# Patient Record
Sex: Male | Born: 1940 | Race: White | Hispanic: No | Marital: Single | Smoking: Former smoker
Health system: Southern US, Community
[De-identification: ages and names within clinical notes are randomized; demographics above are authoritative.]

## PROBLEM LIST (undated history)

## (undated) DIAGNOSIS — K219 Gastro-esophageal reflux disease without esophagitis: Secondary | ICD-10-CM

## (undated) DIAGNOSIS — K449 Diaphragmatic hernia without obstruction or gangrene: Secondary | ICD-10-CM

## (undated) DIAGNOSIS — J189 Pneumonia, unspecified organism: Secondary | ICD-10-CM

## (undated) DIAGNOSIS — H3320 Serous retinal detachment, unspecified eye: Secondary | ICD-10-CM

## (undated) DIAGNOSIS — M810 Age-related osteoporosis without current pathological fracture: Secondary | ICD-10-CM

## (undated) DIAGNOSIS — I639 Cerebral infarction, unspecified: Secondary | ICD-10-CM

## (undated) DIAGNOSIS — E785 Hyperlipidemia, unspecified: Secondary | ICD-10-CM

## (undated) DIAGNOSIS — I10 Essential (primary) hypertension: Secondary | ICD-10-CM

## (undated) DIAGNOSIS — K469 Unspecified abdominal hernia without obstruction or gangrene: Secondary | ICD-10-CM

## (undated) DIAGNOSIS — F482 Pseudobulbar affect: Secondary | ICD-10-CM

## (undated) DIAGNOSIS — M62838 Other muscle spasm: Secondary | ICD-10-CM

## (undated) HISTORY — DX: Hyperlipidemia, unspecified: E78.5

## (undated) HISTORY — DX: Diaphragmatic hernia without obstruction or gangrene: K44.9

## (undated) HISTORY — DX: Pseudobulbar affect: F48.2

## (undated) HISTORY — PX: CATARACT EXTRACTION W/ INTRAOCULAR LENS  IMPLANT, BILATERAL: SHX1307

## (undated) HISTORY — DX: Age-related osteoporosis without current pathological fracture: M81.0

## (undated) HISTORY — DX: Essential (primary) hypertension: I10

## (undated) HISTORY — DX: Other muscle spasm: M62.838

---

## 2005-05-22 DIAGNOSIS — I639 Cerebral infarction, unspecified: Secondary | ICD-10-CM

## 2005-05-22 HISTORY — DX: Cerebral infarction, unspecified: I63.9

## 2009-05-27 ENCOUNTER — Encounter (INDEPENDENT_AMBULATORY_CARE_PROVIDER_SITE_OTHER): Payer: Self-pay | Admitting: General Surgery

## 2009-05-27 ENCOUNTER — Ambulatory Visit (HOSPITAL_COMMUNITY): Admission: EM | Admit: 2009-05-27 | Discharge: 2009-05-31 | Payer: Self-pay | Admitting: Emergency Medicine

## 2009-05-27 HISTORY — PX: APPENDECTOMY: SHX54

## 2010-06-17 ENCOUNTER — Emergency Department (HOSPITAL_COMMUNITY)
Admission: EM | Admit: 2010-06-17 | Discharge: 2010-06-17 | Payer: Self-pay | Source: Home / Self Care | Admitting: Emergency Medicine

## 2010-08-07 LAB — POCT CARDIAC MARKERS
Myoglobin, poc: 118 ng/mL (ref 12–200)
Troponin i, poc: <0.05 ng/mL (ref 0.00–0.09)

## 2010-08-07 LAB — SAMPLE TO BLOOD BANK

## 2010-08-07 LAB — CBC
HCT: 38.3 % — ABNORMAL LOW (ref 39.0–52.0)
Hemoglobin: 12.8 g/dL — ABNORMAL LOW (ref 13.0–17.0)
Hemoglobin: 15.7 g/dL (ref 13.0–17.0)
MCHC: 33.4 g/dL (ref 30.0–36.0)
MCHC: 33.7 g/dL (ref 30.0–36.0)
Platelets: 130 10*3/uL — ABNORMAL LOW (ref 150–400)
RBC: 3.9 MIL/uL — ABNORMAL LOW (ref 4.22–5.81)
RBC: 5.15 MIL/uL (ref 4.22–5.81)
RDW: 13.7 % (ref 11.5–15.5)
RDW: 14 % (ref 11.5–15.5)
WBC: 7.7 10*3/uL (ref 4.0–10.5)

## 2010-08-07 LAB — COMPREHENSIVE METABOLIC PANEL
BUN: 22 mg/dL (ref 6–23)
Chloride: 105 mEq/L (ref 96–112)
GFR calc non Af Amer: >60 mL/min (ref >60)
Glucose, Bld: 131 mg/dL — ABNORMAL HIGH (ref 70–99)
Potassium: 4.6 mEq/L (ref 3.5–5.1)
Sodium: 140 mEq/L (ref 135–145)
Total Bilirubin: 0.9 mg/dL (ref 0.3–1.2)

## 2010-08-07 LAB — DIFFERENTIAL
Eosinophils Absolute: 0 10*3/uL (ref 0.0–0.7)
Lymphocytes Relative: 12 % (ref 12–46)
Neutro Abs: 10.4 10*3/uL — ABNORMAL HIGH (ref 1.7–7.7)

## 2011-07-21 ENCOUNTER — Other Ambulatory Visit: Payer: Self-pay | Admitting: Internal Medicine

## 2011-07-27 ENCOUNTER — Other Ambulatory Visit: Payer: Self-pay

## 2011-07-27 ENCOUNTER — Ambulatory Visit
Admission: RE | Admit: 2011-07-27 | Discharge: 2011-07-27 | Disposition: A | Discharge status: Home / Self Care | Payer: Medicare Other | Source: Ambulatory Visit | Attending: Internal Medicine | Admitting: Internal Medicine

## 2012-04-06 ENCOUNTER — Encounter (HOSPITAL_COMMUNITY): Payer: Self-pay | Admitting: Emergency Medicine

## 2012-04-06 ENCOUNTER — Emergency Department (HOSPITAL_COMMUNITY)
Admission: EM | Admit: 2012-04-06 | Discharge: 2012-04-07 | Disposition: A | Discharge status: Home / Self Care | Payer: Medicare Other | Attending: Emergency Medicine | Admitting: Emergency Medicine

## 2012-04-06 DIAGNOSIS — K449 Diaphragmatic hernia without obstruction or gangrene: Secondary | ICD-10-CM

## 2012-04-06 DIAGNOSIS — I10 Essential (primary) hypertension: Secondary | ICD-10-CM | POA: Insufficient documentation

## 2012-04-06 DIAGNOSIS — Z8673 Personal history of transient ischemic attack (TIA), and cerebral infarction without residual deficits: Secondary | ICD-10-CM | POA: Insufficient documentation

## 2012-04-06 DIAGNOSIS — Z79899 Other long term (current) drug therapy: Secondary | ICD-10-CM | POA: Insufficient documentation

## 2012-04-06 DIAGNOSIS — K219 Gastro-esophageal reflux disease without esophagitis: Secondary | ICD-10-CM | POA: Insufficient documentation

## 2012-04-06 HISTORY — DX: Gastro-esophageal reflux disease without esophagitis: K21.9

## 2012-04-06 HISTORY — DX: Cerebral infarction, unspecified: I63.9

## 2012-04-06 HISTORY — DX: Essential (primary) hypertension: I10

## 2012-04-06 MED ORDER — GI COCKTAIL ~~LOC~~
30.0000 mL | Freq: Once | ORAL | Status: AC
  Administered 2012-04-06: 30 mL via ORAL
  Filled 2012-04-06: qty 30

## 2012-04-06 NOTE — ED Provider Notes (Signed)
History     CSN: [id number]  Arrival date & time 04/06/12  2215   First MD Initiated Contact with Patient 04/06/12 2253      Chief Complaint  Patient presents with  . Abdominal Pain    (Consider location/radiation/quality/duration/timing/severity/associated sxs/prior treatment) HPI This is a 71 year old male who is a resident of a nursing facility due to left hand he presents from an old stroke. He states he's been having episodes of heartburn off and on for about the last 3 weeks. These are usually relieved by TUMS. He had an episode began this evening which was not relieved by [facility] or wasn't relieved by Maalox. The discomfort is moderate to severe, characterized as heartburn, and only mildly exacerbated by movement or palpation. He has had some nausea but no vomiting or diarrhea. He has had some sweating. He he states he is not short of breath. He was given aspirin and sublingual nitroglycerin by EMS prior to arrival without significant relief.  Past Medical History  Diagnosis Date  . Stroke   . GERD (gastroesophageal reflux disease)   . Hypertension     History reviewed. No pertinent past surgical history.  No family history on file.  History  Substance Use Topics  . Smoking status: Not on file  . Smokeless tobacco: Not on file  . Alcohol Use:       Review of Systems  All other systems reviewed and are negative.    Allergies  Penicillins  Home Medications   Current Outpatient Rx  Name  Route  Sig  Dispense  Refill  . ALUMINUM & MAGNESIUM HYDROXIDE 225-200 MG/5ML PO SUSP   Oral   Take 30 mLs by mouth daily as needed. For indigestion         . AMLODIPINE BESYLATE 10 MG PO TABS   Oral   Take 10 mg by mouth daily.         Marland Kitchen BIOTENE DRY MOUTH MT LIQD   Mouth Rinse   10 mLs by Mouth Rinse route 5 (five) times daily as needed. For dry mouth         . BACLOFEN 10 MG PO TABS   Oral   Take 10 mg by mouth 4 (four) times daily.         Marland Kitchen CALCIUM  CARBONATE 1250 MG PO TABS   Oral   Take 1 tablet by mouth 2 (two) times daily with a meal. 500mg  elemental         . VITAMIN D3 2000 UNITS PO CAPS   Oral   Take 2,000 Units by mouth daily.         Marland Kitchen DEXTROMETHORPHAN-QUINIDINE 20-10 MG PO CAPS   Oral   Take 1 capsule by mouth daily.         Marland Kitchen DICYCLOMINE HCL 20 MG PO TABS   Oral   Take 20 mg by mouth 3 (three) times daily.         . ASPIRIN-DIPYRIDAMOLE ER 25-200 MG PO CP12   Oral   Take 1 capsule by mouth 2 (two) times daily.         . FUROSEMIDE 20 MG PO TABS   Oral   Take 20 mg by mouth daily.         Marland Kitchen GEMFIBROZIL 600 MG PO TABS   Oral   Take 600 mg by mouth 2 (two) times daily.         . IBANDRONATE SODIUM 150 MG PO TABS   Oral  Take 150 mg by mouth every 30 (thirty) days. Take in the morning with a full glass of water, on an empty stomach, and do not take anything else by mouth or lie down for the next 30 min. Next dose due on 04/13/12.         Marland Kitchen OMEPRAZOLE 20 MG PO CPDR   Oral   Take 20 mg by mouth daily.         Marland Kitchen PRENATAL PLUS PO   Oral   Take 1 tablet by mouth 2 (two) times daily with a meal.         . SIMVASTATIN 20 MG PO TABS   Oral   Take 20 mg by mouth every evening.           BP 122/80  Temp 97.7 F (36.5 C) (Oral)  Resp 20  SpO2 92%  Physical Exam General: Well-developed, well-nourished male in no acute distress; appearance consistent with age of record HENT: normocephalic, atraumatic Eyes: pupils equal round and reactive to light; extraocular muscles intact Neck: supple Heart: regular rate and rhythm Lungs: clear to auscultation bilaterally Abdomen: soft; nondistended; epigastric; bowel sounds present Extremities: No deformity; full range of motion; trace edema of lower legs Neurologic: Awake, alert and oriented; left hemiparesis; no facial droop Skin: Warm and dry Psychiatric: Normal mood and affect    ED Course  Procedures (including critical care  time)    MDM   Nursing notes and vitals signs, including pulse oximetry, reviewed.  Summary of this visit's results, reviewed by myself:  Labs:  Results for orders placed during the hospital encounter of 04/06/12  CBC WITH DIFFERENTIAL      Component Value Range   WBC 12.0 (*) 4.0 - 10.5 K/uL   RBC 4.92  4.22 - 5.81 MIL/uL   Hemoglobin 15.2  13.0 - 17.0 g/dL   HCT 40.9  81.1 - 91.4 %   MCV 88.8  78.0 - 100.0 fL   MCH 30.9  26.0 - 34.0 pg   MCHC 34.8  30.0 - 36.0 g/dL   RDW 78.2  95.6 - 21.3 %   Platelets 217  150 - 400 K/uL   Neutrophils Relative 92 (*) 43 - 77 %   Neutro Abs 11.0 (*) 1.7 - 7.7 K/uL   Lymphocytes Relative 5 (*) 12 - 46 %   Lymphs Abs 0.6 (*) 0.7 - 4.0 K/uL   Monocytes Relative 2 (*) 3 - 12 %   Monocytes Absolute 0.3  0.1 - 1.0 K/uL   Eosinophils Relative 0  0 - 5 %   Eosinophils Absolute 0.0  0.0 - 0.7 K/uL   Basophils Relative 0  0 - 1 %   Basophils Absolute 0.0  0.0 - 0.1 K/uL  COMPREHENSIVE METABOLIC PANEL      Component Value Range   Sodium 141  135 - 145 mEq/L   Potassium 4.0  3.5 - 5.1 mEq/L   Chloride 101  96 - 112 mEq/L   CO2 24  19 - 32 mEq/L   Glucose, Bld 173 (*) 70 - 99 mg/dL   BUN 21  6 - 23 mg/dL   Creatinine, Ser 0.86  0.50 - 1.35 mg/dL   Calcium 57.8  8.4 - 46.9 mg/dL   Total Protein 8.2  6.0 - 8.3 g/dL   Albumin 4.1  3.5 - 5.2 g/dL   AST 25  0 - 37 U/L   ALT 19  0 - 53 U/L   Alkaline Phosphatase 163 (*)  39 - 117 U/L   Total Bilirubin 0.3  0.3 - 1.2 mg/dL   GFR calc non Af Amer 87 (*) >90 mL/min   GFR calc Af Amer >90  >90 mL/min  LIPASE, BLOOD      Component Value Range   Lipase 13  11 - 59 U/L  TROPONIN I      Component Value Range   Troponin I <0.30  <0.30 ng/mL    Imaging Studies: Dg Chest 2 View  04/07/2012  *RADIOLOGY REPORT*  Clinical Data: Abdominal pain  CHEST - 2 VIEW  Comparison: 05/29/2009  Findings: Large hiatal hernia, increased in size from 2011. Cardiomediastinal contours are otherwise unchanged.   Hypoaeration with interstitial and vascular crowding.  Retrocardiac opacity.  No definite pleural effusion or pneumothorax.  Atherosclerotic vascular calcification.  Diffuse osteopenia and multilevel degenerative changes.  IMPRESSION: Large hiatal hernia, increased in size in the interval.  Retrocardiac opacity is nonspecific; atelectasis, scarring, or infiltrate.   Original Report Authenticated By: Jearld Lesch, M.D.       EKG Interpretation:  Date & Time: 04/06/2012 10:33 PM  Rate: 85  Rhythm: normal sinus rhythm  QRS Axis: normal  Intervals: normal  ST/T Wave abnormalities: nonspecific T wave changes  Conduction Disutrbances:none  Narrative Interpretation:   Old EKG Reviewed: none available  2:12 AM History, examination and x-rays consistent with symptomatic hiatal hernia. We'll increase his omeprazole dose and refer to [facility].        Hanley Seamen, MD 04/07/12 [phone]

## 2012-04-06 NOTE — ED Notes (Signed)
Patient does have significant left side deficit to the left side of the body due to prior stroke. Pt does not ambulate on his own.

## 2012-04-06 NOTE — ED Notes (Signed)
PER EMS- Patient from nursing facility because of stroke several years ago. Pt reports epigastric pain that started approx. 2 hours ago. Patient received 324 asa and 1 NTG with mild relief in route. EMS EKG unremarkable. Alertx4, NAD. Hx HTN, CVA, GERD, previous GI issues. Allergic to PCN.

## 2012-04-07 ENCOUNTER — Emergency Department (HOSPITAL_COMMUNITY): Payer: Medicare Other

## 2012-04-07 LAB — COMPREHENSIVE METABOLIC PANEL
ALT: 19 U/L (ref 0–53)
AST: 25 U/L (ref 0–37)
Alkaline Phosphatase: 163 U/L — ABNORMAL HIGH (ref 39–117)
Calcium: 10.4 mg/dL (ref 8.4–10.5)
GFR calc Af Amer: >90 mL/min (ref >90)
Glucose, Bld: 173 mg/dL — ABNORMAL HIGH (ref 70–99)
Potassium: 4 mEq/L (ref 3.5–5.1)
Sodium: 141 mEq/L (ref 135–145)
Total Protein: 8.2 g/dL (ref 6.0–8.3)

## 2012-04-07 LAB — CBC WITH DIFFERENTIAL/PLATELET
HCT: 43.7 % (ref 39.0–52.0)
Hemoglobin: 15.2 g/dL (ref 13.0–17.0)
Lymphocytes Relative: 5 % — ABNORMAL LOW (ref 12–46)
Monocytes Absolute: 0.3 10*3/uL (ref 0.1–1.0)
Neutro Abs: 11 10*3/uL — ABNORMAL HIGH (ref 1.7–7.7)
RBC: 4.92 MIL/uL (ref 4.22–5.81)
RDW: 12.9 % (ref 11.5–15.5)

## 2012-04-07 MED ORDER — FENTANYL CITRATE 0.05 MG/ML IJ SOLN
100.0000 ug | Freq: Once | INTRAMUSCULAR | Status: AC
  Administered 2012-04-07: 100 ug via INTRAVENOUS
  Filled 2012-04-07: qty 2

## 2012-04-07 MED ORDER — OMEPRAZOLE 40 MG PO CPDR: DELAYED_RELEASE_CAPSULE | ORAL | Status: DC

## 2012-04-07 NOTE — ED Notes (Signed)
Patient transported to X-ray 

## 2012-04-07 NOTE — ED Notes (Signed)
Paging [facility] at this time for transfer back to nursing facility. Alertx4, NAD at this time.

## 2012-04-11 ENCOUNTER — Emergency Department (HOSPITAL_COMMUNITY)
Admission: EM | Admit: 2012-04-11 | Discharge: 2012-04-11 | Disposition: A | Discharge status: Home / Self Care | Payer: Medicare Other | Attending: Emergency Medicine | Admitting: Emergency Medicine

## 2012-04-11 ENCOUNTER — Emergency Department (HOSPITAL_COMMUNITY): Payer: Medicare Other

## 2012-04-11 ENCOUNTER — Encounter (HOSPITAL_COMMUNITY): Payer: Self-pay | Admitting: Emergency Medicine

## 2012-04-11 DIAGNOSIS — Z8673 Personal history of transient ischemic attack (TIA), and cerebral infarction without residual deficits: Secondary | ICD-10-CM | POA: Insufficient documentation

## 2012-04-11 DIAGNOSIS — K219 Gastro-esophageal reflux disease without esophagitis: Secondary | ICD-10-CM | POA: Insufficient documentation

## 2012-04-11 DIAGNOSIS — Z7982 Long term (current) use of aspirin: Secondary | ICD-10-CM | POA: Insufficient documentation

## 2012-04-11 DIAGNOSIS — K449 Diaphragmatic hernia without obstruction or gangrene: Secondary | ICD-10-CM | POA: Insufficient documentation

## 2012-04-11 DIAGNOSIS — Z79899 Other long term (current) drug therapy: Secondary | ICD-10-CM | POA: Insufficient documentation

## 2012-04-11 DIAGNOSIS — N390 Urinary tract infection, site not specified: Secondary | ICD-10-CM

## 2012-04-11 DIAGNOSIS — I10 Essential (primary) hypertension: Secondary | ICD-10-CM | POA: Insufficient documentation

## 2012-04-11 HISTORY — DX: Unspecified abdominal hernia without obstruction or gangrene: K46.9

## 2012-04-11 LAB — COMPREHENSIVE METABOLIC PANEL
AST: 156 U/L — ABNORMAL HIGH (ref 0–37)
BUN: 20 mg/dL (ref 6–23)
CO2: 23 mEq/L (ref 19–32)
Calcium: 9.6 mg/dL (ref 8.4–10.5)
Chloride: 102 mEq/L (ref 96–112)
Creatinine, Ser: 0.79 mg/dL (ref 0.50–1.35)
GFR calc Af Amer: >90 mL/min (ref >90)
GFR calc non Af Amer: >90 mL/min (ref >90)
Glucose, Bld: 186 mg/dL — ABNORMAL HIGH (ref 70–99)
Total Bilirubin: 1.3 mg/dL — ABNORMAL HIGH (ref 0.3–1.2)

## 2012-04-11 LAB — URINALYSIS, ROUTINE W REFLEX MICROSCOPIC
Bilirubin Urine: NEGATIVE
Ketones, ur: NEGATIVE mg/dL
Nitrite: POSITIVE — AB
Protein, ur: 30 mg/dL — AB
pH: 6.5 (ref 5.0–8.0)

## 2012-04-11 LAB — CBC
HCT: 42.7 % (ref 39.0–52.0)
Hemoglobin: 14.9 g/dL (ref 13.0–17.0)
MCH: 30.8 pg (ref 26.0–34.0)
MCV: 88.2 fL (ref 78.0–100.0)
Platelets: 249 10*3/uL (ref 150–400)
RBC: 4.84 MIL/uL (ref 4.22–5.81)
WBC: 13.3 10*3/uL — ABNORMAL HIGH (ref 4.0–10.5)

## 2012-04-11 LAB — URINE MICROSCOPIC-ADD ON

## 2012-04-11 LAB — TROPONIN I: Troponin I: <0.3 ng/mL (ref <0.30)

## 2012-04-11 LAB — LIPASE, BLOOD: Lipase: 11 U/L (ref 11–59)

## 2012-04-11 MED ORDER — MORPHINE SULFATE 4 MG/ML IJ SOLN
4.0000 mg | Freq: Once | INTRAMUSCULAR | Status: AC
  Administered 2012-04-11: 4 mg via INTRAVENOUS
  Filled 2012-04-11: qty 1

## 2012-04-11 MED ORDER — SODIUM CHLORIDE 0.9 % IV BOLUS (SEPSIS)
500.0000 mL | Freq: Once | INTRAVENOUS | Status: AC
  Administered 2012-04-11: 500 mL via INTRAVENOUS

## 2012-04-11 MED ORDER — LEVOFLOXACIN 500 MG PO TABS: 500.0000 mg | ORAL_TABLET | Freq: Every day | ORAL | Status: DC

## 2012-04-11 MED ORDER — LEVOFLOXACIN IN D5W 750 MG/150ML IV SOLN
750.0000 mg | Freq: Once | INTRAVENOUS | Status: AC
  Administered 2012-04-11: 750 mg via INTRAVENOUS
  Filled 2012-04-11: qty 150

## 2012-04-11 MED ORDER — GI COCKTAIL ~~LOC~~
30.0000 mL | Freq: Once | ORAL | Status: AC
  Administered 2012-04-11: 30 mL via ORAL
  Filled 2012-04-11: qty 30

## 2012-04-11 MED ORDER — ONDANSETRON HCL 4 MG/2ML IJ SOLN
4.0000 mg | Freq: Once | INTRAMUSCULAR | Status: AC
  Administered 2012-04-11: 4 mg via INTRAVENOUS
  Filled 2012-04-11: qty 2

## 2012-04-11 NOTE — ED Notes (Signed)
[facility] here to transport patient back to nursing facility.

## 2012-04-11 NOTE — ED Provider Notes (Addendum)
History     CSN: [id number]  Arrival date & time 04/11/12  0002   First MD Initiated Contact with Patient 04/11/12 0003      Chief Complaint  Patient presents with  . Abdominal Pain  . Hernia    (Consider location/radiation/quality/duration/timing/severity/associated sxs/prior treatment) Patient is a 71 y.o. male presenting with abdominal pain. The history is provided by the patient.  Abdominal Pain The primary symptoms of the illness include abdominal pain. The onset of the illness was gradual. The problem has not changed since onset. The patient states that she believes she is currently not pregnant. The patient has not had a change in bowel habit. Significant associated medical issues include GERD.    Past Medical History  Diagnosis Date  . Stroke   . GERD (gastroesophageal reflux disease)   . Hypertension     No past surgical history on file.  No family history on file.  History  Substance Use Topics  . Smoking status: Not on file  . Smokeless tobacco: Not on file  . Alcohol Use:       Review of Systems  Gastrointestinal: Positive for abdominal pain.  All other systems reviewed and are negative.    Allergies  Penicillins  Home Medications   Current Outpatient Rx  Name  Route  Sig  Dispense  Refill  . ALUMINUM & MAGNESIUM HYDROXIDE 225-200 MG/5ML PO SUSP   Oral   Take 30 mLs by mouth daily as needed. For indigestion         . AMLODIPINE BESYLATE 10 MG PO TABS   Oral   Take 10 mg by mouth daily.         Marland Kitchen BIOTENE DRY MOUTH MT LIQD   Mouth Rinse   10 mLs by Mouth Rinse route 5 (five) times daily as needed. For dry mouth         . BACLOFEN 10 MG PO TABS   Oral   Take 10 mg by mouth 4 (four) times daily.         Marland Kitchen CALCIUM CARBONATE 1250 MG PO TABS   Oral   Take 1 tablet by mouth 2 (two) times daily with a meal. 500mg  elemental         . VITAMIN D3 2000 UNITS PO CAPS   Oral   Take 2,000 Units by mouth daily.         Marland Kitchen  DEXTROMETHORPHAN-QUINIDINE 20-10 MG PO CAPS   Oral   Take 1 capsule by mouth daily.         Marland Kitchen DICYCLOMINE HCL 20 MG PO TABS   Oral   Take 20 mg by mouth 3 (three) times daily.         . ASPIRIN-DIPYRIDAMOLE ER 25-200 MG PO CP12   Oral   Take 1 capsule by mouth 2 (two) times daily.         . FUROSEMIDE 20 MG PO TABS   Oral   Take 20 mg by mouth daily.         Marland Kitchen GEMFIBROZIL 600 MG PO TABS   Oral   Take 600 mg by mouth 2 (two) times daily.         . IBANDRONATE SODIUM 150 MG PO TABS   Oral   Take 150 mg by mouth every 30 (thirty) days. Take in the morning with a full glass of water, on an empty stomach, and do not take anything else by mouth or lie down for the next 30 min.  Next dose due on 04/13/12.         Marland Kitchen OMEPRAZOLE 40 MG PO CPDR      Take 1 capsule daily before breakfast.   30 capsule   0   . PRENATAL PLUS PO   Oral   Take 1 tablet by mouth 2 (two) times daily with a meal.         . SIMVASTATIN 20 MG PO TABS   Oral   Take 20 mg by mouth every evening.           BP 158/88  Pulse 107  Temp 98.3 F (36.8 C) (Oral)  Resp 20  SpO2 90%  Physical Exam  Constitutional: He is oriented to person, place, and time. He appears well-developed and well-nourished.  HENT:  Head: Normocephalic and atraumatic.  Eyes: Conjunctivae normal are normal. Pupils are equal, round, and reactive to light.  Neck: Normal range of motion. Neck supple.  Cardiovascular: Normal rate, regular rhythm, normal heart sounds and intact distal pulses.   Pulmonary/Chest: Effort normal and breath sounds normal.  Abdominal: Soft. Bowel sounds are normal. There is tenderness.       Epigastric tenderness  Neurological: He is alert and oriented to person, place, and time.       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  Psychiatric: He has a normal mood and affect. His behavior is normal. Judgment and thought content normal.    ED Course    Procedures (including critical care time)   Labs Reviewed  CBC  COMPREHENSIVE METABOLIC PANEL  TROPONIN I  LIPASE, BLOOD   No results found.   No diagnosis found.   Date: 04/11/2012  Rate: 106  Rhythm: sinus tachycardia  QRS Axis: normal  Intervals: normal  ST/T Wave abnormalities: nonspecific ST changes  Conduction Disutrbances:none  Narrative Interpretation:   Old EKG Reviewed: changes noted   MDM  + epigastric pain.  Hx of gerd,  Hiatal hernia,  Will analgesia,  Image, reassess  Uti,  Will treat dc, ret new/worsening sxs      Deana Krock Lytle Michaels, MD 04/11/12 0025  Meg Niemeier Lytle Michaels, MD 04/11/12 [phone]

## 2012-04-11 NOTE — ED Notes (Signed)
Secretary called [facility] for pt. To be transported to [facility].  Report called; spoke with Inetta Fermo (nurse)/report given.  Advised nurse Inetta Fermo of medications administrated including morphine, Zofran, GI cocktail, 500 ml fluid bolus of NS and levofloxacin given.  Also advised nurse Inetta Fermo of abnormal labs including WBC and urine abnormal labs.  Advised to have pt. follow up with physician for scheduled appointment on Wednesday.(hernia)

## 2012-04-11 NOTE — ED Notes (Signed)
Pt. presents to ED via [facility] with upper abdominal pain with what the pt. Describes as acid reflux symptoms.  Pt. Denies N/V/D.  Pt. Rates pain at 10 out of 10 on pain scale.  Pt. Was constipated; however had bowel movement today.  Pt. Has hx. Of hernia and is to be seen by physician on Wednesday of next week.

## 2012-04-12 ENCOUNTER — Other Ambulatory Visit: Payer: Self-pay | Admitting: Gastroenterology

## 2012-04-12 DIAGNOSIS — R1013 Epigastric pain: Secondary | ICD-10-CM

## 2012-04-12 LAB — URINE CULTURE: Colony Count: 40000

## 2012-05-01 ENCOUNTER — Ambulatory Visit
Admission: RE | Admit: 2012-05-01 | Discharge: 2012-05-01 | Disposition: A | Discharge status: Home / Self Care | Payer: Medicare Other | Source: Ambulatory Visit | Attending: Gastroenterology | Admitting: Gastroenterology

## 2012-05-01 ENCOUNTER — Other Ambulatory Visit: Payer: Self-pay | Admitting: Gastroenterology

## 2012-05-01 DIAGNOSIS — R1013 Epigastric pain: Secondary | ICD-10-CM

## 2012-05-31 ENCOUNTER — Other Ambulatory Visit (INDEPENDENT_AMBULATORY_CARE_PROVIDER_SITE_OTHER): Payer: Self-pay | Admitting: General Surgery

## 2012-05-31 ENCOUNTER — Encounter (INDEPENDENT_AMBULATORY_CARE_PROVIDER_SITE_OTHER): Payer: Self-pay | Admitting: General Surgery

## 2012-05-31 ENCOUNTER — Telehealth (INDEPENDENT_AMBULATORY_CARE_PROVIDER_SITE_OTHER): Payer: Self-pay

## 2012-05-31 ENCOUNTER — Ambulatory Visit (INDEPENDENT_AMBULATORY_CARE_PROVIDER_SITE_OTHER): Payer: Medicare Other | Admitting: General Surgery

## 2012-05-31 ENCOUNTER — Encounter (INDEPENDENT_AMBULATORY_CARE_PROVIDER_SITE_OTHER): Payer: Self-pay | Admitting: Surgery

## 2012-05-31 VITALS — BP 112/82 | HR 80 | Temp 99.0°F | Resp 16 | Ht 69.0 in | Wt 153.0 lb

## 2012-05-31 DIAGNOSIS — K449 Diaphragmatic hernia without obstruction or gangrene: Secondary | ICD-10-CM

## 2012-05-31 NOTE — Telephone Encounter (Signed)
Called pt and spoke with his sister in regards to switching his appt with MM @ 130p to AR @2pm .  Sister said this was OK

## 2012-05-31 NOTE — Progress Notes (Signed)
Subjective:     Patient ID: Gregory Kerr, male   DOB: 07/25/1940, 72 y.o.   MRN: [id number]  HPI The patient is a 72 year old male who was referred by Dr. Bosie Clos for a large hiatal hernia. Patient states that he had epigastric pain prior to Thanksgiving and was seen in the ED on 2 occasions. Patient underwent CT scan which revealed large hiatal hernia. Subsequent to this patient had a upper GI which revealed the majority of her stomach being in his left chest. Since that time patient states he's had no pain or dysphagia. Patient does have a previous history of having a stroke approximately 6 years ago which has left his left side affected.  Review of Systems  Constitutional: Negative.   HENT: Negative.   Respiratory: Negative.   Cardiovascular: Negative.   Gastrointestinal: Positive for abdominal pain (EPIGASTRIC PAIN X3 MO AGO).  Neurological: Negative.        Objective:   Physical Exam  Constitutional: He is oriented to person, place, and time. He appears well-developed and well-nourished.  HENT:  Head: Normocephalic and atraumatic.  Right Ear: External ear normal.  Left Ear: External ear normal.  Eyes: Conjunctivae normal are normal. Pupils are equal, round, and reactive to light.  Neck: Normal range of motion.  Cardiovascular: Normal rate, regular rhythm and normal heart sounds.   Pulmonary/Chest: Effort normal and breath sounds normal.  Abdominal: Soft. Bowel sounds are normal. He exhibits no distension and no mass. There is no tenderness. There is no rebound and no guarding.  Musculoskeletal: Normal range of motion.  Neurological: He is alert and oriented to person, place, and time.       Assessment:     The patient is a 72 year old male with a large hiatal hernia.    Plan:     1. The patient will need medical clearance prior to scheduling a laparoscopic repair.  2. Is a patient of for manometry testing, and gastric emptying study.  3.  The patient states he may seek  a second opinion for more information and options, but they do want to proceed with medical clearance and GI testing in the meantime. They will call us and let's other decision about proceeding with surgery.

## 2012-06-10 ENCOUNTER — Encounter (HOSPITAL_COMMUNITY)
Admission: RE | Admit: 2012-06-10 | Discharge: 2012-06-10 | Disposition: A | Discharge status: Home / Self Care | Payer: Medicare Other | Source: Ambulatory Visit | Attending: General Surgery | Admitting: General Surgery

## 2012-06-10 DIAGNOSIS — K449 Diaphragmatic hernia without obstruction or gangrene: Secondary | ICD-10-CM

## 2012-06-10 DIAGNOSIS — K219 Gastro-esophageal reflux disease without esophagitis: Secondary | ICD-10-CM | POA: Insufficient documentation

## 2012-06-10 DIAGNOSIS — R110 Nausea: Secondary | ICD-10-CM | POA: Insufficient documentation

## 2012-06-10 DIAGNOSIS — R109 Unspecified abdominal pain: Secondary | ICD-10-CM | POA: Insufficient documentation

## 2012-06-10 MED ORDER — TECHNETIUM TC 99M SULFUR COLLOID
2.0000 | Freq: Once | INTRAVENOUS | Status: AC | PRN
  Administered 2012-06-10: 2 via INTRAVENOUS

## 2012-06-18 ENCOUNTER — Telehealth (INDEPENDENT_AMBULATORY_CARE_PROVIDER_SITE_OTHER): Payer: Self-pay | Admitting: General Surgery

## 2012-06-18 NOTE — Telephone Encounter (Signed)
Claris Gower called to say Gregory Kerr would like to reschedule his tests at [facility], scheduled for 06-24-12 to sometime in March. These need to be done before surgery can be scheduled per [facility]. Please reschedule/ gy

## 2012-06-18 NOTE — Telephone Encounter (Signed)
Patient's test R/S for 08/05/12 @8 :00...sister Claris Gower aware.Marland KitchenMarland KitchenNPO 6hrs prior to test

## 2012-06-18 NOTE — Telephone Encounter (Signed)
Called patient's sister Claris Gower [phone] to see why patient wanted to cancel his esophageal manometry and the sister stated that the patient did not want to have anything done at this time or in this cold frigid weather..he wanted things done in the springtime and he felt that the test would be more accurate if it were performed near the time of his surgery..I called and spoke with [facility] [phone] to cancel the test, and the sister is supposed to call us back with the doctor's name that we may call for medical clearance

## 2012-06-24 ENCOUNTER — Encounter (HOSPITAL_COMMUNITY): Admission: RE | Payer: Self-pay | Source: Ambulatory Visit

## 2012-06-24 ENCOUNTER — Ambulatory Visit (HOSPITAL_COMMUNITY): Admission: RE | Admit: 2012-06-24 | Payer: Medicare Other | Source: Ambulatory Visit | Admitting: General Surgery

## 2012-06-24 ENCOUNTER — Encounter (HOSPITAL_COMMUNITY): Payer: Self-pay

## 2012-06-24 ENCOUNTER — Ambulatory Visit (HOSPITAL_COMMUNITY): Admit: 2012-06-24 | Payer: Self-pay | Admitting: Surgery

## 2012-06-24 SURGERY — MANOMETRY, ESOPHAGUS

## 2012-06-24 SURGERY — MANOMETRY, ANORECTAL

## 2012-08-05 ENCOUNTER — Telehealth (INDEPENDENT_AMBULATORY_CARE_PROVIDER_SITE_OTHER): Payer: Self-pay | Admitting: General Surgery

## 2012-08-05 ENCOUNTER — Ambulatory Visit (HOSPITAL_COMMUNITY): Admission: RE | Admit: 2012-08-05 | Payer: Medicare Other | Source: Ambulatory Visit | Admitting: General Surgery

## 2012-08-05 ENCOUNTER — Encounter (HOSPITAL_COMMUNITY): Admission: RE | Payer: Self-pay | Source: Ambulatory Visit

## 2012-08-05 SURGERY — MANOMETRY, ESOPHAGUS
Anesthesia: Topical

## 2012-08-05 NOTE — Telephone Encounter (Signed)
Patient's sister called to verify that patient was scheduled this morning for a manometry. I told her it looks as if he was scheduled for today. She wanted to apologize. She states the nursing home said they weren't notified, even though she told them, of the appt and cancelled it. She will call back to reschedule in April when her [place] is [place].

## 2012-09-18 ENCOUNTER — Ambulatory Visit (INDEPENDENT_AMBULATORY_CARE_PROVIDER_SITE_OTHER): Payer: Medicare Other | Admitting: Neurology

## 2012-09-18 DIAGNOSIS — G8110 Spastic hemiplegia affecting unspecified side: Secondary | ICD-10-CM

## 2012-09-18 DIAGNOSIS — G8114 Spastic hemiplegia affecting left nondominant side: Secondary | ICD-10-CM

## 2012-09-18 DIAGNOSIS — G8310 Monoplegia of lower limb affecting unspecified side: Secondary | ICD-10-CM

## 2012-09-18 MED ORDER — ONABOTULINUMTOXINA 100 UNITS IJ SOLR
400.0000 [IU] | Freq: Once | INTRAMUSCULAR | Status: AC
  Administered 2012-09-18: 400 [IU] via INTRAMUSCULAR

## 2012-09-18 NOTE — Progress Notes (Signed)
HPI: Patient is a 72 year old right-handed Caucasian male, with his sister at today's clinical visit for EMG guided BOTOX injection.   [name]  has suffered  stroke in 2007, with resultant left spastic hemiparesis, at home he has limited functional status, was able to use hemiwalker transfer himself in and out of wheelchair, but rarely ambulating.  he was started on Botox injection sinc 2011.,  He reported good response with the injection, the injection does loosen up his left upper extremity, it started to wear off in 8-10 weeks.  He also has past medical history of hypertension, anemia,     He has forceful  left finger/wrist flexion, limited ROM of left elbow, but he deines signficiant pain. He also has left knee extension, left ankle plantar flexion     Review of Systems  Out of a complete 14 system review, the patient complains of only the following symptoms, and all other reviewed systems are negative.     Physical Exam  General: well nourished, well hydrated, no acute distress Head: normocephalic  Neck: supple, no masses, trachea midline Respiratory: LCTA Cardiovascular: RRR Skin: bilateral upper chest and back small rashes. Trunk: slender   Neurologic Exam  Mental Status: in wheel chair, pleasant. mild slurred hoarse speeach Cranial Nerves: mild left lower face weakness. Motor: spastic left hemiparesis. Left arm stayed in left shoulder internal rotation, addcution, left forearm pronation, shoulder abduction 3, elbow flexion 3, left wrist flexed position, difficulty with left PIP, DIP joint flexion, but able to hold the posture, left elbow fixed, maximum extension 150 degree. Left leg extension at knee, left ankle plantar inversions,  Sensory: intact to primary modalities Coordination: Rapid alternating movements and   Finger-to-nose, heel to shin normal on  the Right. Gait and Station: Wheelchair bound. Reflexes: Left ankle clonus    Assessment  And Plan:  stroke, spastic  left hemiparesis, under EMG guidance  400 units of xeomin was injected into left upper and lower extremity muscles only (Lots [id number], 2014), 100 units dissovled into 2 cc NS.     Left pronator teres 50 units left brachialis  25x2units=50 units Left biceps  25 unitsx2=50 units Left flexor carpi ulnaris 25 units  Left palmaris longus 25 units Left flexor carpi radialis 25 units  Left tibialis posterior 50 units Left medial gastrocnemius 100 units. Left flexor digitorum longus 25  He will return in 3 months for repeat injection.

## 2012-09-23 ENCOUNTER — Encounter: Payer: Self-pay | Admitting: Nurse Practitioner

## 2012-10-07 ENCOUNTER — Encounter: Payer: Self-pay | Admitting: Nurse Practitioner

## 2012-10-24 ENCOUNTER — Encounter: Payer: Self-pay | Admitting: Nurse Practitioner

## 2012-10-24 ENCOUNTER — Non-Acute Institutional Stay (SKILLED_NURSING_FACILITY): Payer: Medicare Other | Admitting: Nurse Practitioner

## 2012-10-24 DIAGNOSIS — I639 Cerebral infarction, unspecified: Secondary | ICD-10-CM

## 2012-10-24 DIAGNOSIS — M62838 Other muscle spasm: Secondary | ICD-10-CM

## 2012-10-24 DIAGNOSIS — E785 Hyperlipidemia, unspecified: Secondary | ICD-10-CM

## 2012-10-24 DIAGNOSIS — F482 Pseudobulbar affect: Secondary | ICD-10-CM

## 2012-10-24 DIAGNOSIS — K449 Diaphragmatic hernia without obstruction or gangrene: Secondary | ICD-10-CM | POA: Insufficient documentation

## 2012-10-24 DIAGNOSIS — I10 Essential (primary) hypertension: Secondary | ICD-10-CM

## 2012-10-24 DIAGNOSIS — I6350 Cerebral infarction due to unspecified occlusion or stenosis of unspecified cerebral artery: Secondary | ICD-10-CM

## 2012-10-24 DIAGNOSIS — M810 Age-related osteoporosis without current pathological fracture: Secondary | ICD-10-CM

## 2012-10-24 HISTORY — DX: Age-related osteoporosis without current pathological fracture: M81.0

## 2012-10-24 HISTORY — DX: Diaphragmatic hernia without obstruction or gangrene: K44.9

## 2012-10-24 HISTORY — DX: Pseudobulbar affect: F48.2

## 2012-10-24 HISTORY — DX: Other muscle spasm: M62.838

## 2012-10-24 HISTORY — DX: Essential (primary) hypertension: I10

## 2012-10-24 NOTE — Progress Notes (Signed)
Subjective:    Patient ID: Gregory Kerr, male    DOB: 18-Feb-1941, 72 y.o.   MRN: [id number]  HPI Comments: Patient was seen today for routine visit. He is in his room, alert and oriented. He is w/o complaints. He has hx of CVA with Left sided hemiparesis. He is followed by Dr. Terrace Arabia - neuro, has received Botox injections in the past for spasticity. He has dx of hiatal hernia, was seen by GI, had CT scan that revealed a large hiatal hernia. Upper Gi showed that majority of his stomach was in his left chest. He was sent to [facility] by Dr. Bosie Clos - GI, for treatment and possible laparoscopic repair of hiatal hernia. On that appointment date 05/31/12 he wanted to seek a second opinion for more information and options, but wanted to proceed with medical clearance and GI testing. He will call [facility] when ready. He has not had any recurrent issues with epigastric pain.      Review of Systems  Constitutional: Negative.   HENT: Negative.   Eyes: Negative.   Respiratory: Negative.   Cardiovascular: Negative.   Gastrointestinal: Negative.        Hiatal Hernia  Endocrine: Negative.   Genitourinary: Negative.   Musculoskeletal: Negative.   Skin: Negative.   Allergic/Immunologic: Negative.   Neurological:       Left sided hemiparesis. Hx of spasticity.  Hematological: Negative.   Psychiatric/Behavioral: Negative.    The following portions of the patient's history were reviewed and updated as appropriate: allergies, current medications, past family history, past medical history, past social history, past surgical history and problem list.    Objective:   Physical Exam  Vitals reviewed. Constitutional: He is oriented to person, place, and time. He appears well-developed and well-nourished. No distress.  Eyes: Conjunctivae are normal. Pupils are equal, round, and reactive to light. Right eye exhibits no discharge. Left eye exhibits no discharge.  Neck: No tracheal deviation present. No thyromegaly present.   Cardiovascular: Normal rate and regular rhythm.   Pulmonary/Chest: Effort normal and breath sounds normal.  Abdominal: Soft. Bowel sounds are normal.  Musculoskeletal:       Right shoulder: Normal.       Right elbow: Normal.      Right wrist: Normal.       Right hip: Normal.       Right knee: Normal.       Right ankle: Normal.  Left upper arm and left leg hemiparesis.  Neurological: He is alert and oriented to person, place, and time.  Left hemiparesis and spasticity.  Skin: Skin is warm and dry. He is not diaphoretic.  Psychiatric: He has a normal mood and affect.   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 No acute fracture, subluxation, dislocation or lytic destructive lesion. Mild joint space narrowing at the radiocarpal articulation.  08/13/12: na 138, k 3.7, glucose 84, bun 21, creatinine 0.92, alk phos 84, ast 15, alt 17, total protein 5.9, albumin 3.5, calcium 8.8, cholesterol 153, triglyceride 85, hdl 34, ldl 102, vitamin d 63.      Assessment & Plan:  CVA (cerebral infarction) Stable on Aggrenox. Followed by Dr. Terrace Arabia - neurologist.   HTN (hypertension) Stable, taking Lasix, Norvasc. Today 110/68.  Osteoporosis, unspecified Per Bone density scan. Is taking Boniva monthly, vitamin d, and Oscal daily.  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 Stable on Baclofen. He does receive Botox injections by Dr. Terrace Arabia.  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 Mood has been stable.   Other and unspecified hyperlipidemia Taking Zocor 20mg  po qd. Last lipid panel 08/13/12 = wnl with hdl low at 34, and ldl high at 102. These values are increased from previous 05/14/12 - hdl 38, ldl 86.  - Will increase Zocor to 30mg  po qpm. 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 Will recheck lipid panel in 6 weeks.  Hiatal hernia Taking Prilosec. Has been seen by GI with  Surgical consult. Pt has been asymptomatic. He will decide if he wants to have laparoscopic surgery per surgery consult.

## 2012-10-24 NOTE — Assessment & Plan Note (Addendum)
Stable on Aggrenox. Followed by Dr. Terrace Arabia - neurologist.

## 2012-10-24 NOTE — Assessment & Plan Note (Signed)
Hx of cva with left sided hemiparesis. Stable on Baclofen. He does receive Botox injections by Dr. Terrace Arabia.

## 2012-10-24 NOTE — Assessment & Plan Note (Signed)
Stable, taking Lasix, Norvasc. Today 110/68.

## 2012-10-24 NOTE — Assessment & Plan Note (Signed)
Per Bone density scan. Is taking Boniva monthly, vitamin d, and Oscal daily.

## 2012-10-24 NOTE — Assessment & Plan Note (Signed)
Stated on Nuedexta for emotional incontinence, by [facility] psych. Mood has been stable.

## 2012-10-24 NOTE — Assessment & Plan Note (Signed)
Taking Prilosec. Has been seen by GI with Surgical consult. Pt has been asymptomatic. He will decide if he wants to have laparoscopic surgery per surgery consult.

## 2012-10-24 NOTE — Assessment & Plan Note (Signed)
Taking Zocor 20mg  po qd. Last lipid panel 08/13/12 = wnl with hdl low at 34, and ldl high at 102. These values are increased from previous 05/14/12 - hdl 38, ldl 86.  - Will increase Zocor to 30mg  po qpm. 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 Will recheck lipid panel in 6 weeks.

## 2012-12-18 ENCOUNTER — Encounter: Payer: Self-pay | Admitting: Neurology

## 2012-12-18 ENCOUNTER — Ambulatory Visit: Payer: Medicare Other | Admitting: Neurology

## 2012-12-18 ENCOUNTER — Ambulatory Visit (INDEPENDENT_AMBULATORY_CARE_PROVIDER_SITE_OTHER): Payer: Medicare Other | Admitting: Neurology

## 2012-12-18 DIAGNOSIS — G8110 Spastic hemiplegia affecting unspecified side: Secondary | ICD-10-CM

## 2012-12-18 DIAGNOSIS — M62838 Other muscle spasm: Secondary | ICD-10-CM

## 2012-12-18 DIAGNOSIS — I10 Essential (primary) hypertension: Secondary | ICD-10-CM

## 2012-12-18 DIAGNOSIS — I639 Cerebral infarction, unspecified: Secondary | ICD-10-CM

## 2012-12-18 DIAGNOSIS — G8310 Monoplegia of lower limb affecting unspecified side: Secondary | ICD-10-CM

## 2012-12-18 MED ORDER — INCOBOTULINUMTOXINA 100 UNITS IM SOLR
100.0000 [IU] | Freq: Once | INTRAMUSCULAR | Status: AC
  Administered 2012-12-18: 100 [IU] via INTRAMUSCULAR

## 2012-12-18 NOTE — Progress Notes (Signed)
HPI:   Gregory Kerr is a 72 year old right-handed Caucasian male, with his sister at today's clinical visit for EMG guided BOTOX injection.   Gregory Kerr  has suffered  stroke in 2007, with resultant left spastic hemiparesis, at home he has limited functional status, was able to use hemiwalker transfer himself in and out of wheelchair, but rarely ambulating.  he was started on Botox injection sinc 2011.,  He reported good response with the injection, the injection does loosen up his left upper extremity, it started to wear off in 8-10 weeks.  He also has past medical history of hypertension, anemia,     He has forceful  left finger/wrist flexion, limited ROM of left elbow, but he deines signficiant pain. He also has left knee extension, left ankle plantar flexion   UPDATE July 30th 2014:  Last injection was in April 30th 2014. He tolerated the injection very well which has helped his forceful left wrist flexion, left lateral ankle plantar flexion,There was no significant side effect, he has received physical therapy afterwards, which has helped his walking   Review of Systems  Out of a complete 14 system review, the patient complains of only the following symptoms, and all other reviewed systems are negative.     Physical Exam  General: well nourished, well hydrated, no acute distress Head: normocephalic  Neck: supple, no masses, trachea midline Respiratory: LCTA Cardiovascular: RRR Skin: bilateral upper chest and back small rashes. Trunk: slender   Neurologic Exam  Mental Status: in wheel chair, pleasant. mild slurred hoarse speeach Cranial Nerves: mild left lower face weakness. Motor: spastic left hemiparesis. Left arm stayed in left shoulder internal rotation, addcution, left forearm pronation, shoulder abduction 3, elbow flexion 3, left wrist forceful flexed position,  left elbow fixed flexion, pronation. maximum extension 150 degree. Left leg extension at knee, left ankle plantar inversions,   Sensory: intact to primary modalities Coordination: Rapid alternating movements and   Finger-to-nose, heel to shin normal on  the Right. Gait and Station: Wheelchair bound. Reflexes: Left ankle clonus    Assessment and Plan:   72 yo with history of stroke, spastic left hemiparesis, under EMG guidance  400 units of xeomin was injected into left upper and lower extremity muscles only (Lots [id number] exp Feb 2016), 100 units dissovled into 2 cc NS.     Left pronator teres 50 units Left brachialis  25x2units=50 units Left biceps  25 unitsx2=50 units Left flexor carpi ulnaris 25 units  Left palmaris longus 25 units Left flexor carpi radialis 25 units  Left tibialis posterior 50 units Left medial gastrocnemius  25 units. Left flexor digitorum longus 25x2=50 units  He will return in 3 months for repeat injection.

## 2012-12-25 ENCOUNTER — Other Ambulatory Visit: Payer: Self-pay

## 2012-12-26 ENCOUNTER — Other Ambulatory Visit: Payer: Self-pay | Admitting: Geriatric Medicine

## 2012-12-26 MED ORDER — OXYCODONE HCL 5 MG PO TABS: 5.0000 mg | ORAL_TABLET | Freq: Four times a day (QID) | ORAL | Status: DC | PRN

## 2013-01-14 NOTE — Progress Notes (Signed)
This encounter was created in error - please disregard.

## 2013-01-14 NOTE — Progress Notes (Signed)
This encounter was created in error - please disregard.  This encounter was created in error - please disregard.

## 2013-01-30 ENCOUNTER — Non-Acute Institutional Stay (SKILLED_NURSING_FACILITY): Payer: PRIVATE HEALTH INSURANCE | Admitting: Internal Medicine

## 2013-01-30 DIAGNOSIS — E785 Hyperlipidemia, unspecified: Secondary | ICD-10-CM

## 2013-01-30 DIAGNOSIS — I10 Essential (primary) hypertension: Secondary | ICD-10-CM

## 2013-01-30 DIAGNOSIS — I6350 Cerebral infarction due to unspecified occlusion or stenosis of unspecified cerebral artery: Secondary | ICD-10-CM

## 2013-01-30 DIAGNOSIS — M810 Age-related osteoporosis without current pathological fracture: Secondary | ICD-10-CM

## 2013-01-30 DIAGNOSIS — K449 Diaphragmatic hernia without obstruction or gangrene: Secondary | ICD-10-CM

## 2013-01-30 DIAGNOSIS — F482 Pseudobulbar affect: Secondary | ICD-10-CM

## 2013-01-30 DIAGNOSIS — K589 Irritable bowel syndrome without diarrhea: Secondary | ICD-10-CM | POA: Insufficient documentation

## 2013-01-30 DIAGNOSIS — I639 Cerebral infarction, unspecified: Secondary | ICD-10-CM

## 2013-01-30 DIAGNOSIS — M62838 Other muscle spasm: Secondary | ICD-10-CM

## 2013-01-30 DIAGNOSIS — G894 Chronic pain syndrome: Secondary | ICD-10-CM

## 2013-01-30 NOTE — Progress Notes (Signed)
Patient ID: Gregory Kerr, male   DOB: 30-Nov-1940, 72 y.o.   MRN: [id number]  Chief Complaint  Patient presents with  . Medical Managment of Chronic Issues    Code status- full code  Allergies  Allergen Reactions  . Penicillins Rash    [facility]  HPI 72 y/o male patient is a long term care resident and seen today for routine visit. He has hx of CVA with left sided hemiparesis and has muscle spasticity. He follows with Dr Terrace Arabia from neurology. He was seen in his room today. He has no concerns this visit. No concerns from staff. He propels himself on the wheelchair. No falls reported  Review of Systems  Constitutional: Negative for fever, chills and diaphoresis.  HENT: Negative for sore throat.        Good dentition  Eyes: Negative for blurred vision.       Wears corrective glasses  Respiratory: Negative for cough and shortness of breath.   Cardiovascular: Negative for chest pain, leg swelling and PND.  Gastrointestinal: Negative for heartburn, nausea, vomiting, abdominal pain and constipation.  Genitourinary: Negative for dysuria.  Skin: Negative for rash.  Neurological: Negative for dizziness, seizures, loss of consciousness, weakness and headaches.  Psychiatric/Behavioral: Negative for depression. The patient does not have insomnia.    Past Medical History  Diagnosis Date  . Stroke   . GERD (gastroesophageal reflux disease)   . Hypertension   . Hernia   . Hyperlipidemia   . HTN (hypertension) 10/24/2012  . Osteoporosis, unspecified 10/24/2012  . 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 Other and unspecified hyperlipidemia 10/24/2012  . Hiatal hernia 10/24/2012   Past Surgical History  Procedure Laterality Date  . Appendectomy     History   Social History  . Marital Status: Single    Spouse Name: N/A    Number of Children: N/A  . Years of Education: N/A   Occupational History  . Not on file.   Social History Main Topics  .  Smoking status: Former Smoker    Types: Cigarettes  . Smokeless tobacco: Never Used  . Alcohol Use: No  . Drug Use: No  . Sexual Activity:    Other Topics Concern  . Not on file   Social History Narrative  . No narrative on file   Current Outpatient Prescriptions on File Prior to Visit  Medication Sig Dispense Refill  . antiseptic oral rinse (BIOTENE) LIQD 10 mLs by Mouth Rinse route 5 (five) times daily as needed. For dry mouth      . baclofen (LIORESAL) 10 MG tablet Take 10 mg by mouth 4 (four) times daily.      . calcium carbonate (OS-CAL - DOSED IN MG OF ELEMENTAL CALCIUM) 1250 MG tablet Take 1 tablet by mouth 2 (two) times daily with a meal. 500mg  elemental      . Cholecalciferol (VITAMIN D3) 2000 UNITS capsule Take 2,000 Units by mouth daily.      Marland Kitchen Dextromethorphan-Quinidine (NUEDEXTA) 20-10 MG CAPS Take 1 capsule by mouth daily.      Marland Kitchen dicyclomine (BENTYL) 20 MG tablet Take 10 mg by mouth 3 (three) times daily.       Marland Kitchen dipyridamole-aspirin (AGGRENOX) 200-25 MG per 12 hr capsule Take 1 capsule by mouth 2 (two) times daily.      . furosemide (LASIX) 20 MG tablet Take 20 mg by mouth daily.      Marland Kitchen gabapentin (NEURONTIN) 100 MG capsule Take  100 mg by mouth 3 (three) times daily.      Marland Kitchen ibandronate (BONIVA) 150 MG tablet Take 150 mg by mouth every 30 (thirty) days. Take in the morning with a full glass of water, on an empty stomach, and do not take anything else by mouth or lie down for the next 30 min. Next dose due on 04/13/12.      Marland Kitchen omeprazole (PRILOSEC) 20 MG capsule Take 20 mg by mouth daily.      Marland Kitchen oxyCODONE (OXY IR/ROXICODONE) 5 MG immediate release tablet Take 1 tablet (5 mg total) by mouth every 6 (six) hours as needed for pain.  120 tablet  0  . Prenatal Vit-Fe Fumarate-FA (PRENATAL PLUS PO) Take 1 tablet by mouth 2 (two) times daily with a meal.      . simvastatin (ZOCOR) 20 MG tablet Take 30 mg by mouth every evening.        No current facility-administered medications on  file prior to visit.    Physical exam  BP 97/62  Pulse 71  Temp(Src) 98.2 F (36.8 C)  Resp 20  SpO2 95%   Constitutional: He is oriented to person, place, and time. He appears well-developed and well-nourished. No distress.  Eyes: Conjunctivae are normal. Pupils are equal, round, and reactive to light. Right eye exhibits no discharge. Left eye exhibits no discharge.  Neck: No tracheal deviation present. No thyromegaly present.  Cardiovascular: Normal rate and regular rhythm.   Pulmonary/Chest: Effort normal and breath sounds normal.  Abdominal: Soft. Bowel sounds are normal.  Musculoskeletal:       Left upper arm and left leg hemiparesis. Diminished ROM in left UE and LE. spasticity present Neurological: He is alert and oriented to person, place, and time.  Skin: Skin is warm and dry. He is not diaphoretic.  Psychiatric: He has a normal mood and affect.    Labs- 08/13/12 na 138, k 3.7, bun 21, cr 0.92, glu 84, ca 8.8, t.chol 153, tg 85, ldl 102, hdl 34, vit d 63 1/61/09 t.chol 140, tg 98, ldl 81, hdl 39  Assessment/plan-  CVA He is independent with toileting and transfers. He needs assistance with dressing and eating and propels himself on wheelchair. Left sided hemiparesis. Continue aggrenox and antispasmodics. Fall precautions  catatract Has upcoming cataract surgery on 02/03/13. Reviewed eye drops needed prior to surgery.  HTN  bp well controlled at present. Will continue amlodipine and lasix, monitor bmp  Osteoporosis, unspecified  Boniva monthly, vitamin d, and Oscal daily. 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 Continue baclofen, aggrenox, statin.   Irritable bowel syndrome Continue dicyclomine, tolerating it well  hyperlipidemia Continue zocor  Hiatal hernia Continue prilosec for now  Chronic pain Continue oxyIR and neurontin for  now. Also on dicyclomine

## 2013-03-11 ENCOUNTER — Encounter: Payer: Self-pay | Admitting: *Deleted

## 2013-03-20 ENCOUNTER — Ambulatory Visit (INDEPENDENT_AMBULATORY_CARE_PROVIDER_SITE_OTHER): Payer: PRIVATE HEALTH INSURANCE | Admitting: Neurology

## 2013-03-20 ENCOUNTER — Non-Acute Institutional Stay (SKILLED_NURSING_FACILITY): Payer: PRIVATE HEALTH INSURANCE | Admitting: Internal Medicine

## 2013-03-20 ENCOUNTER — Encounter: Payer: Self-pay | Admitting: Neurology

## 2013-03-20 DIAGNOSIS — G8110 Spastic hemiplegia affecting unspecified side: Secondary | ICD-10-CM

## 2013-03-20 DIAGNOSIS — I6350 Cerebral infarction due to unspecified occlusion or stenosis of unspecified cerebral artery: Secondary | ICD-10-CM

## 2013-03-20 DIAGNOSIS — K589 Irritable bowel syndrome without diarrhea: Secondary | ICD-10-CM

## 2013-03-20 DIAGNOSIS — F482 Pseudobulbar affect: Secondary | ICD-10-CM

## 2013-03-20 DIAGNOSIS — G8310 Monoplegia of lower limb affecting unspecified side: Secondary | ICD-10-CM

## 2013-03-20 DIAGNOSIS — I10 Essential (primary) hypertension: Secondary | ICD-10-CM

## 2013-03-20 DIAGNOSIS — I639 Cerebral infarction, unspecified: Secondary | ICD-10-CM

## 2013-03-20 DIAGNOSIS — G8314 Monoplegia of lower limb affecting left nondominant side: Secondary | ICD-10-CM | POA: Insufficient documentation

## 2013-03-20 MED ORDER — INCOBOTULINUMTOXINA 100 UNITS IM SOLR
400.0000 [IU] | Freq: Once | INTRAMUSCULAR | Status: AC
  Administered 2013-03-20: 400 [IU] via INTRAMUSCULAR

## 2013-03-20 NOTE — Progress Notes (Signed)
Patient ID: Gregory [facility], male   DOB: [birth year], 72 y.o.   MRN: [id number]  Gregory Kerr  Code status- full code  Allergies- PCN  Chief complaint- routine monthly visit  HPI 72 y/o male patient is a long term [facility] resident and seen today for routine visit. He has hx of CVA with left sided hemiparesis and has muscle spasticity. He follows with Dr Terrace Arabia from neurology and seeing him today for botox injection. He was seen in his room today. He has no concerns this visit. No concerns from staff. He propels himself on the wheelchair. No falls reported. He had cataract surgery of both his eyes without complications. No falls reported. No new skin concerns. No behavioral changes reported  Review of Systems  Constitutional: Negative for fever, chills and diaphoresis.  HENT: Negative for sore throat.         Good dentition  Eyes: Negative for blurred vision.        Wears corrective glasses  Respiratory: Negative for cough and shortness of breath.   Cardiovascular: Negative for chest pain, leg swelling and PND.  Gastrointestinal: Negative for heartburn, nausea, vomiting, abdominal pain and constipation.  Genitourinary: Negative for dysuria.  Skin: Negative for rash.  Neurological: Negative for dizziness, seizures, loss of consciousness, weakness and headaches.  Psychiatric/Behavioral: Negative for depression  Past Medical History  Diagnosis Date  . Stroke   . GERD (gastroesophageal reflux disease)   . Hypertension   . Hernia   . Hyperlipidemia   . HTN (hypertension) 10/24/2012  . Osteoporosis, unspecified 10/24/2012  . 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 Other and unspecified hyperlipidemia 10/24/2012  . Hiatal hernia 10/24/2012   Past Surgical History  Procedure Laterality Date  . Appendectomy  05/27/2009    Bertram Savin, MD   Medication reviewed. See [facility]  Physical exam  Nursing note and vital signs reviewed   Constitutional: He is oriented  to person, place, and time. He appears well-developed and well-nourished. No distress.   Eyes: Conjunctivae are normal. Pupils are equal, round, and reactive to light. Right eye exhibits no discharge. Left eye exhibits no discharge.   Neck: No tracheal deviation present. No thyromegaly present.   Cardiovascular: Normal rate and regular rhythm.    Pulmonary/Chest: Effort normal and breath sounds normal.   Abdominal: Soft. Bowel sounds are normal.  Musculoskeletal:       Left upper arm and left leg hemiparesis. Diminished ROM in left UE and LE. spasticity present Neurological: He is alert and oriented to person, place, and time.   Skin: Skin is warm and dry. He is not diaphoretic.  Psychiatric: He has a normal mood and affect.    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 Continue baclofen, aggrenox, statin. Also on gabapentin and oxycodone. Getting his botox injection today  CVA He is independent with toileting and transfers. He needs assistance with dressing and eating and propels himself on wheelchair. Left sided hemiparesis. Continue aggrenox and antispasmodics. 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 Will continue amlodipine and lasix, monitor bmp  Osteoporosis, unspecified  Boniva monthly, vitamin d, and Oscal daily. Fall precautions  Irritable bowel syndrome Continue dicyclomine, tolerating it well  hyperlipidemia Continue zocor  Hiatal hernia Continue prilosec for now  Chronic pain Continue oxyIR and neurontin for now. Also on dicyclomine

## 2013-03-20 NOTE — Progress Notes (Signed)
HPI:   Gregory Kerr is a 72 year old right-handed Caucasian male, came in for EMG guided BOTOX injection.   Gregory Kerr  has suffered  stroke in 2007, with resultant left spastic hemiparesis, at home he has limited functional status, was able to use hemiwalker transfer himself in and out of wheelchair, wearing left foot ankle brace, hew as able to ambulate some.  he was started on Botox injection since 2011.,  He reported good response with the injection, the injection does loosen up his left upper extremity, it started to wear off in 8-10 weeks.  He also has past medical history of hypertension, anemia,     He has forceful  left finger/wrist flexion, limited ROM of left elbow, but he deines signficiant pain. He also has left knee extension, left ankle plantar flexion   UPDATE Oct 30th 2014:  Last injection was in July 30th 2014. He tolerated the injection very well which has helped his left arm spasticity, but he had difficulty closing his left hand aftewards,  He continues to have left lateral ankle plantar flexion,There was no significant side effect, he has received physical therapy afterwards, which has helped his walking   Review of Systems  Out of a complete 14 system review, the patient complains of only the following symptoms, and all other reviewed systems are negative.     Physical Exam  General: well nourished, well hydrated, no acute distress Head: normocephalic  Neck: supple, no masses, trachea midline Respiratory: LCTA Cardiovascular: RRR Skin: bilateral upper chest and back small rashes. Trunk: slender   Neurologic Exam  Mental Status: in wheel chair, pleasant. mild slurred hoarse speeach Cranial Nerves: mild left lower face weakness. Motor: spastic left hemiparesis. Left arm stayed in left shoulder internal rotation, addcution, left forearm pronation, shoulder abduction 3, elbow flexion 3, left wrist forceful flexed position, maximum 150 degree,  left elbow fixed flexion,  pronation. maximum extension 150 degree. Left leg extension at knee, left ankle plantar inversion.  Sensory: intact to primary modalities Coordination: normal right finger to nose Gait and Station: deferred    Assessment and Plan:   72 yo with history of stroke, spastic left hemiparesis, under EMG guidance  400 units of xeomin was injected into left upper and lower extremity muscles only (Lots [id number], exp March 2015), 100 units dissovled into 2 cc NS.     Left pronator teres 25 units Left brachialis  25 x 3 units=75 units Left biceps  25 units x 3= 75 units Left teres major 25x2=50 units. Left pectroalis major 25 x2=50 units Left latissmus dorsi 25 units   Left tibialis posterior 50 units  Left flexor digitorum longus 25x2=50 units  He will return in 3 months for repeat injection.

## 2013-03-27 ENCOUNTER — Other Ambulatory Visit: Payer: Self-pay

## 2013-05-01 ENCOUNTER — Non-Acute Institutional Stay (SKILLED_NURSING_FACILITY): Payer: PRIVATE HEALTH INSURANCE | Admitting: Internal Medicine

## 2013-05-01 ENCOUNTER — Encounter: Payer: Self-pay | Admitting: Internal Medicine

## 2013-05-01 DIAGNOSIS — I10 Essential (primary) hypertension: Secondary | ICD-10-CM

## 2013-05-01 DIAGNOSIS — I6350 Cerebral infarction due to unspecified occlusion or stenosis of unspecified cerebral artery: Secondary | ICD-10-CM

## 2013-05-01 DIAGNOSIS — G8110 Spastic hemiplegia affecting unspecified side: Secondary | ICD-10-CM

## 2013-05-01 DIAGNOSIS — F482 Pseudobulbar affect: Secondary | ICD-10-CM

## 2013-05-01 DIAGNOSIS — I639 Cerebral infarction, unspecified: Secondary | ICD-10-CM

## 2013-05-01 NOTE — Progress Notes (Signed)
Patient ID: Gregory [facility], male   DOB: April 17, 1941, 72 y.o.   MRN: [id number]  [facility]  Code status- full code  Allergies- PCN  Chief complaint- routine visit  HPI 72 y/o male patient is a long term care resident and seen today for routine visit. He has history of CVA with left sided hemiparesis with muscle spasticity. He follows with Dr Terrace Arabia from neurology for botox injection. He was seen in his room today. He has no concerns this visit. No concerns from staff. He propels himself on the wheelchair. No falls reported. He had cataract surgery of both his eyes. He is off boniva now  Review of Systems   Constitutional: Negative for fever, chills and diaphoresis.   HENT: Negative for sore throat.         Good dentition   Eyes: Negative for blurred vision.        Wears corrective glasses   Respiratory: Negative for cough and shortness of breath.    Cardiovascular: Negative for chest pain, leg swelling and PND.   Gastrointestinal: Negative for heartburn, nausea, vomiting, abdominal pain and constipation.   Genitourinary: Negative for dysuria.   Skin: Negative for rash.   Neurological: Negative for dizziness, seizures, loss of consciousness, weakness and headaches.   Psychiatric/Behavioral: Negative for depression    Medication reviewed. See [facility]  Physical exam BP 100/63  Pulse 63  Temp(Src) 98.1 F (36.7 C)  Resp 16  SpO2 96%  Constitutional: He is oriented to person, place, and time. He appears well-developed and well-nourished. No distress.   Eyes: Conjunctivae are normal. Pupils are equal, round, and reactive to light. Right eye exhibits no discharge. Left eye exhibits no discharge.   Neck: No tracheal deviation present. No thyromegaly present.   Cardiovascular: Normal rate and regular rhythm.    Pulmonary/Chest: Effort normal and breath sounds normal.   Abdominal: Soft. Bowel sounds are normal.  Musculoskeletal:       Left upper arm and left leg hemiparesis.  Diminished ROM in left UE and LE. spasticity present Neurological: He is alert and oriented to person, place, and time.   Skin: Skin is warm and dry. He is not diaphoretic.  Psychiatric: He has a normal mood and affect.   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 Continue baclofen, aggrenox, statin. Also on gabapentin and oxycodone. 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 He needs assistance with dressing and eating and propels himself on wheelchair. Left sided hemiparesis. Continue aggrenox and antispasmodics. Fall precautions  HTN  bp well controlled at present. Will continue amlodipine and lasix, monitor bmp  Chronic pain Continue oxyIR and neurontin for now. Also on dicyclomine

## 2013-06-20 ENCOUNTER — Encounter: Payer: Self-pay | Admitting: Internal Medicine

## 2013-06-20 ENCOUNTER — Non-Acute Institutional Stay (SKILLED_NURSING_FACILITY): Payer: PRIVATE HEALTH INSURANCE | Admitting: Internal Medicine

## 2013-06-20 DIAGNOSIS — G8110 Spastic hemiplegia affecting unspecified side: Secondary | ICD-10-CM

## 2013-06-20 DIAGNOSIS — E785 Hyperlipidemia, unspecified: Secondary | ICD-10-CM

## 2013-06-20 DIAGNOSIS — I10 Essential (primary) hypertension: Secondary | ICD-10-CM

## 2013-06-20 DIAGNOSIS — K449 Diaphragmatic hernia without obstruction or gangrene: Secondary | ICD-10-CM

## 2013-06-20 DIAGNOSIS — I6350 Cerebral infarction due to unspecified occlusion or stenosis of unspecified cerebral artery: Secondary | ICD-10-CM

## 2013-06-20 DIAGNOSIS — I639 Cerebral infarction, unspecified: Secondary | ICD-10-CM

## 2013-06-20 NOTE — Progress Notes (Signed)
Patient ID: Gregory [facility], male   DOB: 07-Mar-1941, 73 y.o.   MRN: [id number]     [facility]  Code status- full code  Allergies- PCN  Chief complaint- routine visit  HPI 73 y/o male patient is a long term care resident and seen today for routine visit. He has history of CVA with left sided hemiparesis with muscle spasticity. He follows with Dr Krista Blue from neurology for botox injection. He was seen in his room today. He has no concerns this visit. No concerns from staff. He propels himself on the wheelchair.   Review of Systems   Constitutional: Negative for fever, chills and diaphoresis.   HENT: Negative for sore throat.         Good dentition   Eyes: Negative for blurred vision.        Wears corrective glasses   Respiratory: Negative for cough and shortness of breath.    Cardiovascular: Negative for chest pain, leg swelling and PND.   Gastrointestinal: Negative for heartburn, nausea, vomiting, abdominal pain and constipation.   Genitourinary: Negative for dysuria.   Skin: Negative for rash.   Neurological: Negative for dizziness, seizures, loss of consciousness, weakness and headaches.   Psychiatric/Behavioral: Negative for depression    Medication reviewed. See [facility]  Physical exam BP 130/68  Pulse 84  Temp(Src) 97 F (36.1 C)  Resp 18  SpO2 98%  Constitutional: He is oriented to person, place, and time. He appears well-developed and well-nourished. No distress.   Eyes: Conjunctivae are normal. Pupils are equal, round, and reactive to light. Right eye exhibits no discharge. Left eye exhibits no discharge.   Neck: No tracheal deviation present. No thyromegaly present.   Cardiovascular: Normal rate and regular rhythm.    Pulmonary/Chest: Effort normal and breath sounds normal.   Abdominal: Soft. Bowel sounds are normal.  Musculoskeletal:       Left upper arm and left leg hemiparesis. Diminished ROM in left UE and LE. spasticity present Neurological: He is alert  and oriented to person, place, and time.   Skin: Skin is warm and dry. He is not diaphoretic.  Psychiatric: He has a normal mood and affect.   Labs- 08/13/12 na 138, k 3.7, bun 21, cr 0.92, glu 84, ca 8.8, t.chol 153, tg 85, ldl 102, hdl 34, vit d 63 12/05/12 t.chol 140, tg 98, ldl 81, hdl 39 02/03/13 wbc 10, hb 14.8, hct 45.1, plt 165, na 136, k 4, bun 17, cr 1, glu 93, ca 9.5  Assessment/plan-  CVA He is independent with toileting and transfers. He needs assistance with dressing and eating and propels himself on wheelchair. Left sided hemiparesis. Continue aggrenox and antispasmodics. Fall precautions. bp well controlled  HTN  bp well controlled at present. Will continue amlodipine and lasix, monitor bmp  Hyperlipidemia Continue zocor 30 mg daily  GERD Has hiatal hernia. 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 Continue baclofen, aggrenox, statin. Also on gabapentin and oxycodone. Getting his botox injection today. On nudexta and tolerating it well for his pseudobulbar affect. Continue oxyIR and neurontin for now for the pain. Also on dicyclomine

## 2013-06-25 ENCOUNTER — Encounter: Payer: Self-pay | Admitting: Neurology

## 2013-06-25 ENCOUNTER — Ambulatory Visit (INDEPENDENT_AMBULATORY_CARE_PROVIDER_SITE_OTHER): Payer: PRIVATE HEALTH INSURANCE | Admitting: Neurology

## 2013-06-25 VITALS — BP 119/82 | HR 76

## 2013-06-25 DIAGNOSIS — G8110 Spastic hemiplegia affecting unspecified side: Secondary | ICD-10-CM

## 2013-06-25 DIAGNOSIS — I6350 Cerebral infarction due to unspecified occlusion or stenosis of unspecified cerebral artery: Secondary | ICD-10-CM

## 2013-06-25 DIAGNOSIS — I639 Cerebral infarction, unspecified: Secondary | ICD-10-CM

## 2013-06-25 MED ORDER — INCOBOTULINUMTOXINA 100 UNITS IM SOLR
400.0000 [IU] | Freq: Once | INTRAMUSCULAR | Status: AC
  Administered 2013-06-25: 400 [IU] via INTRAMUSCULAR

## 2013-06-25 NOTE — Progress Notes (Signed)
HPI:   Gregory Kerr is a 73 year old right-handed Caucasian male, came in for EMG guided BOTOX injection.   Gregory Kerr  has suffered  stroke in 2007, with resultant left spastic hemiparesis, at home he has limited functional status, was able to use hemiwalker transfer himself in and out of wheelchair, wearing left foot ankle brace, hew as able to ambulate some.  he was started on Botox injection since 2011.,  He reported good response with the injection, the injection does loosen up his left upper extremity, it started to wear off in 8-10 weeks.  He also has past medical history of hypertension, anemia,     He has forceful  left finger/wrist flexion, limited ROM of left elbow, but he deines signficiant pain. He also has left knee extension, left ankle plantar flexion   UPDATE Feb 4th 2015:  Last injection was in Oct 2014. He tolerated the injection very well which has helped his left arm spasticity,   He continues to have left lateral ankle plantar flexion, left knee extension, There was no significant side effect, he has received recreational therapy afterwards   Review of the system: Out of a complete 14 system review, the patient complains of only the following symptoms, and all other reviewed systems are negative.     Physical Exam  General: well nourished, well hydrated, no acute distress Head: normocephalic  Neck: supple, no masses, trachea midline Respiratory: LCTA Cardiovascular: RRR Skin: bilateral upper chest and back small rashes. Trunk: slender   Neurologic Exam  Mental Status: in wheel chair, pleasant. mild slurred hoarse speeach Cranial Nerves: mild left lower face weakness. Motor: spastic left hemiparesis. Left arm stayed in left shoulder internal rotation, addcution, left forearm pronation, shoulder abduction 3, elbow flexion 3, left wrist forceful flexed position, maximum 170 degree,  left elbow fixed flexion, pronation. maximum extension 160 degree. Left leg extension at knee,  left ankle fixed plantar inversion.  Sensory: intact to primary modalities Coordination: normal right finger to nose Gait and Station: deferred    Assessment and Plan:  73 yo with history of stroke, spastic left hemiparesis, under EMG guidance  400 units of xeomin was injected into left upper and lower extremity muscles only (Lots [id number], exp Jan 2017  100 units / 2 cc NS).     Left pronator teres 25 units Left brachialis  25 x 3 units=75 units Left biceps  25 units x 3= 75 units Left flexor carpi ulnaris 25  Left teres major 25x2=50 units. Left pectroalis major 25 x2=50 units Left latissmus dorsi 25 units   Left vastus lateralis 25 Left vastus medialis 25  Left tibialis posterior 25  He will return in 3 months for repeat injection.

## 2013-07-14 ENCOUNTER — Non-Acute Institutional Stay (SKILLED_NURSING_FACILITY): Payer: PRIVATE HEALTH INSURANCE | Admitting: Internal Medicine

## 2013-07-14 DIAGNOSIS — G8110 Spastic hemiplegia affecting unspecified side: Secondary | ICD-10-CM

## 2013-07-14 DIAGNOSIS — I639 Cerebral infarction, unspecified: Secondary | ICD-10-CM

## 2013-07-14 DIAGNOSIS — E785 Hyperlipidemia, unspecified: Secondary | ICD-10-CM

## 2013-07-14 DIAGNOSIS — I6350 Cerebral infarction due to unspecified occlusion or stenosis of unspecified cerebral artery: Secondary | ICD-10-CM

## 2013-07-14 NOTE — Progress Notes (Signed)
Patient ID: Gregory Kerr, male   DOB: [birth year], 73 y.o.   MRN: [id number]    [facility]  Allergies- PCN  Chief complaint- routine visit  HPI 73 y/o male patient is a long term care resident and seen today for routine visit. He has history of CVA with left sided spastic hemiparesis. He uses a hemi walker, able to ambulate some and has left foot ankle brace. He follows with Dr Krista Blue from neurology and gets botox injection to help with his spasms. Reviewed Dr Rhea Belton note. He was seen in his room today. He has no concerns this visit. No concerns from staff.   Review of Systems   Constitutional: Negative for fever, chills and diaphoresis.   HENT: Negative for sore throat.  Good dentition   Eyes: Negative for blurred vision. Wears corrective glasses   Respiratory: Negative for cough and shortness of breath.    Cardiovascular: Negative for chest pain, leg swelling and PND.   Gastrointestinal: Negative for heartburn, nausea, vomiting, abdominal pain and constipation.   Genitourinary: Negative for dysuria.   Skin: Negative for rash.   Neurological: Negative for dizziness, seizures, loss of consciousness, weakness and headaches.   Psychiatric/Behavioral: Negative for depression    Medication reviewed. See [facility]  Past Medical History  Diagnosis Date  . Stroke   . GERD (gastroesophageal reflux disease)   . Hypertension   . Hernia   . Hyperlipidemia   . HTN (hypertension) 10/24/2012  . Osteoporosis, unspecified 10/24/2012  . 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 Other and unspecified hyperlipidemia 10/24/2012  . Hiatal hernia 10/24/2012   Past Surgical History  Procedure Laterality Date  . Appendectomy  05/27/2009    Ronnald Collum, MD    Physical exam BP 101/65  Pulse 67  Temp(Src) 97.1 F (36.2 C)  Resp 18  SpO2 96%  Constitutional: He is oriented to person, place, and time. He appears well-developed and well-nourished. No distress.   Eyes:  Conjunctivae are normal. Pupils are equal, round, and reactive to light. Right eye exhibits no discharge. Left eye exhibits no discharge.   Neck: No tracheal deviation present. No thyromegaly present.   Cardiovascular: Normal rate and regular rhythm.    Pulmonary/Chest: Effort normal and breath sounds normal.   Abdominal: Soft. Bowel sounds are normal.  Musculoskeletal: Left upper arm and left leg hemiparesis. Diminished ROM in left UE and LE. spasticity present Neurological: He is alert and oriented to person, place, and time.   Skin: Skin is warm and dry. He is not diaphoretic.  Psychiatric: He has a normal mood and affect.   Labs- 08/13/12 na 138, k 3.7, bun 21, cr 0.92, glu 84, ca 8.8, t.chol 153, tg 85, ldl 102, hdl 34, vit d 63 12/05/12 t.chol 140, tg 98, ldl 81, hdl 39 02/03/13 wbc 10, hb 14.8, hct 45.1, plt 165, na 136, k 4, bun 17, cr 1, glu 93, ca 9.5  Assessment/plan  Spastic hemiplegia affecting non dominant side From old CVA, follows with Dr Krista Blue and gets botox injection q3 months. Continue baclofen, aggrenox, statin. Also on gabapentin and oxycodone.   CVA He is independent with toileting and transfers. He needs assistance with dressing and eating and propels himself on wheelchair. Left sided hemiparesis. Continue aggrenox and antispasmodics. Fall precautions. bp well controlled  HTN  bp well controlled at present. Will continue amlodipine and lasix, monitor bmp  Hyperlipidemia Continue zocor daily  GERD Has hiatal hernia. Continue prilosec 20  mg daily

## 2013-08-03 LAB — LIPID PANEL
CHOLESTEROL: 129 mg/dL (ref 0–200)
HDL: 32 mg/dL — AB (ref 35–70)
LDL CALC: 73 mg/dL
TRIGLYCERIDES: 119 mg/dL (ref 40–160)

## 2013-08-03 LAB — CBC AND DIFFERENTIAL
HCT: 45 % (ref 41–53)
HEMOGLOBIN: 14.4 g/dL (ref 13.5–17.5)
Platelets: 159 10*3/uL (ref 150–399)
WBC: 5 10*3/mL

## 2013-08-03 LAB — HEPATIC FUNCTION PANEL
ALT: 15 U/L (ref 10–40)
AST: 16 U/L (ref 14–40)
BILIRUBIN, TOTAL: 0.3 mg/dL

## 2013-08-03 LAB — BASIC METABOLIC PANEL
BUN: 20 mg/dL (ref 4–21)
Glucose: 82 mg/dL
POTASSIUM: 4 mmol/L (ref 3.4–5.3)
SODIUM: 142 mmol/L (ref 137–147)

## 2013-08-11 ENCOUNTER — Encounter: Payer: Self-pay | Admitting: Internal Medicine

## 2013-08-11 ENCOUNTER — Non-Acute Institutional Stay (SKILLED_NURSING_FACILITY): Payer: PRIVATE HEALTH INSURANCE | Admitting: Internal Medicine

## 2013-08-11 DIAGNOSIS — E785 Hyperlipidemia, unspecified: Secondary | ICD-10-CM

## 2013-08-11 DIAGNOSIS — I639 Cerebral infarction, unspecified: Secondary | ICD-10-CM

## 2013-08-11 DIAGNOSIS — K219 Gastro-esophageal reflux disease without esophagitis: Secondary | ICD-10-CM

## 2013-08-11 DIAGNOSIS — K589 Irritable bowel syndrome without diarrhea: Secondary | ICD-10-CM

## 2013-08-11 DIAGNOSIS — G8110 Spastic hemiplegia affecting unspecified side: Secondary | ICD-10-CM

## 2013-08-11 DIAGNOSIS — M818 Other osteoporosis without current pathological fracture: Secondary | ICD-10-CM

## 2013-08-11 DIAGNOSIS — I6350 Cerebral infarction due to unspecified occlusion or stenosis of unspecified cerebral artery: Secondary | ICD-10-CM

## 2013-08-11 DIAGNOSIS — I10 Essential (primary) hypertension: Secondary | ICD-10-CM

## 2013-08-11 NOTE — Progress Notes (Signed)
Patient ID: Gregory Kerr, male   DOB: 08/26/1940, 73 y.o.   MRN: [id number]    [facility]  Allergies- PCN  Chief complaint- routine visit  HPI 73 y/o male patient is seen today for routine visit. He has history of CVA with left sided spastic hemiparesis. He uses a hemi walker, able to ambulate some and has left foot ankle brace. He follows with Dr Krista Blue from neurology and gets botox injection to help with his spasms.He has no concerns this visit. No concerns from staff.   Review of Systems   Constitutional: Negative for fever, chills and diaphoresis.   HENT: Negative for sore throat.  Good dentition   Eyes: Negative for blurred vision. Wears corrective glasses   Respiratory: Negative for cough and shortness of breath.    Cardiovascular: Negative for chest pain, leg swelling and PND.   Gastrointestinal: Negative for heartburn, nausea, vomiting, abdominal pain and constipation.   Genitourinary: Negative for dysuria.   Skin: Negative for rash.   Neurological: Negative for dizziness, seizures, loss of consciousness, weakness and headaches.   Psychiatric/Behavioral: Negative for depression    Past Medical History  Diagnosis Date  . Stroke   . GERD (gastroesophageal reflux disease)   . Hypertension   . Hernia   . Hyperlipidemia   . HTN (hypertension) 10/24/2012  . Osteoporosis, unspecified 10/24/2012  . 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 Other and unspecified hyperlipidemia 10/24/2012  . Hiatal hernia 10/24/2012   Current Outpatient Prescriptions on File Prior to Visit  Medication Sig Dispense Refill  . amLODipine (NORVASC) 5 MG tablet Take 5 mg by mouth daily.      Marland Kitchen antiseptic oral rinse (BIOTENE) LIQD 10 mLs by Mouth Rinse route 5 (five) times daily as needed. For dry mouth      . baclofen (LIORESAL) 10 MG tablet Take 10 mg by mouth 4 (four) times daily.      Marland Kitchen Besifloxacin HCl (BESIVANCE) 0.6 % SUSP Apply to eye as directed.      .  Bromfenac Sodium (PROLENSA) 0.07 % SOLN Apply 1 drop to eye. 2 days prior to cataract surgery on 02/03/13      . calcium carbonate (OS-CAL - DOSED IN MG OF ELEMENTAL CALCIUM) 1250 MG tablet Take 1 tablet by mouth 2 (two) times daily with a meal. 500mg  elemental      . Cholecalciferol (VITAMIN D3) 2000 UNITS capsule Take 2,000 Units by mouth daily.      Marland Kitchen Dextromethorphan-Quinidine (NUEDEXTA) 20-10 MG CAPS Take 1 capsule by mouth daily.      Marland Kitchen dicyclomine (BENTYL) 20 MG tablet Take 10 mg by mouth 3 (three) times daily.       . Difluprednate (DUREZOL) 0.05 % EMUL Apply to eye as directed.      . dipyridamole-aspirin (AGGRENOX) 200-25 MG per 12 hr capsule Take 1 capsule by mouth 2 (two) times daily.      . furosemide (LASIX) 20 MG tablet Take 20 mg by mouth daily.      Marland Kitchen gabapentin (NEURONTIN) 100 MG capsule Take 100 mg by mouth 3 (three) times daily.      Marland Kitchen ibandronate (BONIVA) 150 MG tablet Take 150 mg by mouth every 30 (thirty) days. Take in the morning with a full glass of water, on an empty stomach, and do not take anything else by mouth or lie down for the next 30 min. Next dose due on 04/13/12.      Marland Kitchen  omeprazole (PRILOSEC) 20 MG capsule Take 20 mg by mouth daily.      Marland Kitchen oxyCODONE (OXY IR/ROXICODONE) 5 MG immediate release tablet Take 1 tablet (5 mg total) by mouth every 6 (six) hours as needed for pain.  120 tablet  0  . Prenatal Vit-Fe Fumarate-FA (PRENATAL PLUS PO) Take 1 tablet by mouth 2 (two) times daily with a meal.      . simvastatin (ZOCOR) 20 MG tablet Take 30 mg by mouth every evening.        No current facility-administered medications on file prior to visit.   Past Surgical History  Procedure Laterality Date  . Appendectomy  05/27/2009    Ronnald Collum, MD    Physical exam BP 126/72  Pulse 68  Temp(Src) 96 F (35.6 C)  Resp 19  SpO2 96%  Constitutional: He is oriented to person, place, and time. He appears well-developed and well-nourished. No distress.   Eyes:  Conjunctivae are normal. Pupils are equal, round, and reactive to light. Right eye exhibits no discharge. Left eye exhibits no discharge.   Neck: No tracheal deviation present. No thyromegaly present.   Cardiovascular: Normal rate and regular rhythm.    Pulmonary/Chest: Effort normal and breath sounds normal.   Abdominal: Soft. Bowel sounds are normal.  Musculoskeletal: Left upper arm and left leg hemiparesis. Diminished ROM in left UE and LE. spasticity present Neurological: He is alert and oriented to person, place, and time.   Skin: Skin is warm and dry. He is not diaphoretic.  Psychiatric: He has a normal mood and affect.   Labs- 08/13/12 na 138, k 3.7, bun 21, cr 0.92, glu 84, ca 8.8, t.chol 153, tg 85, ldl 102, hdl 34, vit d 63 12/05/12 t.chol 140, tg 98, ldl 81, hdl 39 02/03/13 wbc 10, hb 14.8, hct 45.1, plt 165, na 136, k 4, bun 17, cr 1, glu 93, ca 9.5 07/21/13 PSA 3.380  Assessment/plan  IBS Stable. Continue bentyl  Osteoporosis Continue oscal and vit d supplement. Fall precautions  Esophageal reflux Continue prilosec 20 mg daily and monitor symptoms. Has hiatal hernia  Spastic hemiplegia affecting non dominant side From old CVA, follows with Dr Krista Blue and gets botox injection q3 months. Continue baclofen, aggrenox, statin. Also on gabapentin and oxycodone.   CVA He is independent with toileting and transfers. He needs assistance with dressing and eating and propels himself on wheelchair. Left sided hemiparesis. Continue aggrenox and antispasmodics. Fall precautions. bp well controlled  HTN  bp well controlled at present. Will continue amlodipine and lasix, monitor bmp  Hyperlipidemia Continue zocor daily

## 2013-09-18 ENCOUNTER — Non-Acute Institutional Stay (SKILLED_NURSING_FACILITY): Payer: PRIVATE HEALTH INSURANCE | Admitting: Internal Medicine

## 2013-09-18 DIAGNOSIS — G8110 Spastic hemiplegia affecting unspecified side: Secondary | ICD-10-CM

## 2013-09-18 DIAGNOSIS — G894 Chronic pain syndrome: Secondary | ICD-10-CM

## 2013-09-18 DIAGNOSIS — F482 Pseudobulbar affect: Secondary | ICD-10-CM

## 2013-09-18 NOTE — Progress Notes (Signed)
Patient ID: Gregory Kerr, male   DOB: [birth year], 73 y.o.   MRN: [id number]    [facility]  Allergies- PCN  Chief complaint- routine visit  HPI 73 y/o male patient is seen today for routine visit. He is at his baseline with no new concerns. He has history of CVA with left sided spastic hemiparesis. He uses a hemi walker, able to ambulate some and has left foot ankle brace. He follows with Dr Krista Blue from neurology and gets botox injection to help with his spasms.  Review of Systems   Constitutional: Negative for fever, chills and diaphoresis.   HENT: Negative for sore throat.  Good dentition   Eyes: Negative for blurred vision. Wears corrective glasses   Respiratory: Negative for cough and shortness of breath.    Cardiovascular: Negative for chest pain, leg swelling and PND.   Gastrointestinal: Negative for heartburn, nausea, vomiting, abdominal pain and constipation.   Genitourinary: Negative for dysuria.   Skin: Negative for rash.   Neurological: Negative for dizziness, seizures, loss of consciousness, weakness and headaches.   Psychiatric/Behavioral: Negative for depression  Past Medical History  Diagnosis Date  . Stroke   . GERD (gastroesophageal reflux disease)   . Hypertension   . Hernia   . Hyperlipidemia   . HTN (hypertension) 10/24/2012  . Osteoporosis, unspecified 10/24/2012  . 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 Other and unspecified hyperlipidemia 10/24/2012  . Hiatal hernia 10/24/2012   Medication reviewed. See [facility]  Physical exam BP 113/66  Pulse 70  Temp(Src) 97 F (36.1 C)  Resp 18  Wt 156 lb (70.761 kg)  SpO2 95%  Constitutional: He is oriented to person, place, and time. He appears well-developed and well-nourished. No distress.   Eyes: Conjunctivae are normal. Pupils are equal, round, and reactive to light. Right eye exhibits no discharge. Left eye exhibits no discharge.   Neck: No tracheal deviation present. No  thyromegaly present.   Cardiovascular: Normal rate and regular rhythm.    Pulmonary/Chest: Effort normal and breath sounds normal.   Abdominal: Soft. Bowel sounds are normal.  Musculoskeletal: Left upper arm and left leg hemiparesis. Diminished ROM in left UE and LE. spasticity present Neurological: He is alert and oriented to person, place, and time.   Skin: Skin is warm and dry. He is not diaphoretic.  Psychiatric: He has a normal mood and affect.   Labs- 08/13/12 na 138, k 3.7, bun 21, cr 0.92, glu 84, ca 8.8, t.chol 153, tg 85, ldl 102, hdl 34, vit d 63 12/05/12 t.chol 140, tg 98, ldl 81, hdl 39 02/03/13 wbc 10, hb 14.8, hct 45.1, plt 165, na 136, k 4, bun 17, cr 1, glu 93, ca 9.5 07/21/13 PSA 3.380 08/03/13 wbc 5, hb 14.4, hct 45, plt 159, na 142, k 4, bun 20, cr 0.9, glu 82, ca 9.1, t.chol 129, tg 119, ldl 73, hdl 32  Assessment/plan  Chronic pain syndrome Continue baclofen with gabapentin and prn oxycodone for now   Spastic hemiplegia affecting non dominant side From old CVA, follows with Dr Krista Blue and gets botox injection q3 months. Continue baclofen, aggrenox, statin. Also on gabapentin and oxycodone.   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 monitor

## 2013-10-06 ENCOUNTER — Non-Acute Institutional Stay (SKILLED_NURSING_FACILITY): Payer: PRIVATE HEALTH INSURANCE | Admitting: Internal Medicine

## 2013-10-06 DIAGNOSIS — M159 Polyosteoarthritis, unspecified: Secondary | ICD-10-CM

## 2013-10-06 DIAGNOSIS — I10 Essential (primary) hypertension: Secondary | ICD-10-CM

## 2013-10-06 DIAGNOSIS — E785 Hyperlipidemia, unspecified: Secondary | ICD-10-CM

## 2013-10-15 ENCOUNTER — Ambulatory Visit: Payer: PRIVATE HEALTH INSURANCE | Admitting: Neurology

## 2013-11-03 ENCOUNTER — Non-Acute Institutional Stay (SKILLED_NURSING_FACILITY): Payer: PRIVATE HEALTH INSURANCE | Admitting: Internal Medicine

## 2013-11-03 DIAGNOSIS — G8110 Spastic hemiplegia affecting unspecified side: Secondary | ICD-10-CM

## 2013-11-03 DIAGNOSIS — I10 Essential (primary) hypertension: Secondary | ICD-10-CM

## 2013-11-03 DIAGNOSIS — F482 Pseudobulbar affect: Secondary | ICD-10-CM

## 2013-11-03 NOTE — Progress Notes (Signed)
Patient ID: Gregory Kerr, male   DOB: 1940/11/19, 73 y.o.   MRN: [id number]     [place] Complaint  Patient presents with  . Medical Management of Chronic Issues    pseudobulbar affect, htn, spastic hemiplegia    Allergies  Allergen Reactions  . Penicillins Rash   Code Status: Full Code  HPI: 73 y/o male patient is seen today for routine visit. He is at his baseline with no new concerns. He has history of CVA with left sided spastic hemiparesis, HTN, and pseudobulbar affect. Denies pain. Weight stable. No skin issues. No recent fall reported. No concerns reported from staff.   Review of Systems: Constitutional: Negative for fever, chills and diaphoresis.   HENT: Negative for earache, nasal congestion, and sore throat Eyes: Negative for blurred vision. Wears corrective glasses   Respiratory: Negative for cough and shortness of breath.    Cardiovascular: Negative for chest pain, leg swelling, and orthopnea   Gastrointestinal: Negative for heartburn, nausea, vomiting, abdominal pain, diarrhea and constipation.   Genitourinary: Negative for dysuria.   Skin: Negative for rash.   Neurological: Negative for dizziness, seizures, loss of consciousness, weakness and headaches.   Psychiatric/Behavioral: Negative for depression  Past Medical History  Diagnosis Date  . Stroke   . GERD (gastroesophageal reflux disease)   . Hypertension   . Hernia   . Hyperlipidemia   . HTN (hypertension) 10/24/2012  . Osteoporosis, unspecified 10/24/2012  . 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 Other and unspecified hyperlipidemia 10/24/2012  . Hiatal hernia 10/24/2012    Past Surgical History  Procedure Laterality Date  . Appendectomy  05/27/2009    Ronnald Collum, MD    Outpatient Encounter Prescriptions as of 11/03/2013  Medication Sig Note  . amLODipine (NORVASC) 5 MG tablet Take 5 mg by mouth daily.   . baclofen (LIORESAL) 10 MG tablet  Take 10 mg by mouth 4 (four) times daily.   . calcium carbonate (OS-CAL - DOSED IN MG OF ELEMENTAL CALCIUM) 1250 MG tablet Take 1 tablet by mouth 2 (two) times daily with a meal. 500mg  elemental   . Cholecalciferol (VITAMIN D3) 2000 UNITS capsule Take 2,000 Units by mouth daily.   Marland Kitchen Dextromethorphan-Quinidine (NUEDEXTA) 20-10 MG CAPS Take 1 capsule by mouth daily.   Marland Kitchen dicyclomine (BENTYL) 20 MG tablet Take 10 mg by mouth 3 (three) times daily.    Marland Kitchen dipyridamole-aspirin (AGGRENOX) 200-25 MG per 12 hr capsule Take 1 capsule by mouth 2 (two) times daily.   . furosemide (LASIX) 20 MG tablet Take 20 mg by mouth daily.   Marland Kitchen gabapentin (NEURONTIN) 100 MG capsule Take 100 mg by mouth 3 (three) times daily.   Marland Kitchen omeprazole (PRILOSEC) 20 MG capsule Take 20 mg by mouth daily.   . simvastatin (ZOCOR) 20 MG tablet Take 30 mg by mouth every evening.    . terbinafine (LAMISIL) 250 MG tablet Take 250 mg by mouth daily.   Marland Kitchen oxyCODONE (OXY IR/ROXICODONE) 5 MG immediate release tablet Take 1 tablet (5 mg total) by mouth every 6 (six) hours as needed for pain. 11/03/2013: Not on med list at [facility]  . [DISCONTINUED] antiseptic oral rinse (BIOTENE) LIQD 10 mLs by Mouth Rinse route 5 (five) times daily as needed. For dry mouth   . [DISCONTINUED] Besifloxacin HCl (BESIVANCE) 0.6 % SUSP Apply to eye as directed.   . [DISCONTINUED] Bromfenac Sodium (PROLENSA) 0.07 % SOLN Apply 1 drop to eye.  2 days prior to cataract surgery on 02/03/13   . [DISCONTINUED] Difluprednate (DUREZOL) 0.05 % EMUL Apply to eye as directed.   . [DISCONTINUED] ibandronate (BONIVA) 150 MG tablet Take 150 mg by mouth every 30 (thirty) days. Take in the morning with a full glass of water, on an empty stomach, and do not take anything else by mouth or lie down for the next 30 min. Next dose due on 04/13/12.   . [DISCONTINUED] Prenatal Vit-Fe Fumarate-FA (PRENATAL PLUS PO) Take 1 tablet by mouth 2 (two) times daily with a meal.     Physical Exam: BP 113/64   Pulse 85  Temp(Src) 99.1 F (37.3 C)  Resp 18  Ht 5\' 7"  (1.702 m)  Wt 163 lb 6.4 oz (74.118 kg)  BMI 25.59 kg/m2  SpO2 95%  Constitutional: He is oriented to person, place, and time. He appears well-developed and well-nourished. He is in no acute distress.   Eyes: Conjunctivae are normal. Pupils are equal, round, and reactive to light. EOM intact Neck: No tracheal deviation present. No carotid bruit. No JVD Cardiovascular: Normal rate and regular rhythm.  No rubs, murmurs, or gallops. Pulses intact. Left lower leg edema at baseline.  Pulmonary/Chest: Unlabored respiratory effort. Clear to auscultation bilaterally. No wheezing, crackles, or rhonchi  Abdominal: Soft. Bowel sounds present. No tenderness.  Musculoskeletal: Left upper arm and left leg hemiparesis. Diminished ROM in left UE and LE. Left hand contracture Neurological: He is alert and oriented to person, place, and time.   Skin: Skin is warm and dry. He is not diaphoretic.  Psychiatric: He has a normal mood and affect.   Labs Reviewed:  08/13/12 na 138, k 3.7, bun 21, cr 0.92, glu 84, ca 8.8, t.chol 153, tg 85, ldl 102, hdl 34, vit d 63 12/05/12 t.chol 140, tg 98, ldl 81, hdl 39 02/03/13 wbc 10, hb 14.8, hct 45.1, plt 165, na 136, k 4, bun 17, cr 1, glu 93, ca 9.5 07/21/13 PSA 3.380 08/03/13 wbc 5, hb 14.4, hct 45, plt 159, na 142, k 4, bun 20, cr 0.9, glu 82, ca 9.1, t.chol 129, tg 119, ldl 73, hdl 32  Assessment/Plan:   1. Pseudobulbar affect Stable. Continue Nuedexta. Monitor clinically.   2. Spastic hemiplegia affecting nondominant side From old CVA. Continue baclofen, gabapentin, aggronox, and simvastatin.   3. HTN (hypertension) Well controlled. Continue amlodipine 5mg  qd and lasix 20mg  qd  Plan of care discuss with patient and nursing staff. Patient and staff verbalize understanding and agree with plan of care.

## 2013-11-19 ENCOUNTER — Encounter: Payer: Self-pay | Admitting: Neurology

## 2013-11-19 ENCOUNTER — Ambulatory Visit (INDEPENDENT_AMBULATORY_CARE_PROVIDER_SITE_OTHER): Payer: PRIVATE HEALTH INSURANCE | Admitting: Neurology

## 2013-11-19 ENCOUNTER — Telehealth: Payer: Self-pay | Admitting: Neurology

## 2013-11-19 VITALS — BP 129/80 | HR 69 | Ht 69.0 in | Wt 155.0 lb

## 2013-11-19 DIAGNOSIS — G8110 Spastic hemiplegia affecting unspecified side: Secondary | ICD-10-CM

## 2013-11-19 DIAGNOSIS — I6350 Cerebral infarction due to unspecified occlusion or stenosis of unspecified cerebral artery: Secondary | ICD-10-CM

## 2013-11-19 DIAGNOSIS — M62838 Other muscle spasm: Secondary | ICD-10-CM

## 2013-11-19 DIAGNOSIS — I10 Essential (primary) hypertension: Secondary | ICD-10-CM

## 2013-11-19 DIAGNOSIS — F482 Pseudobulbar affect: Secondary | ICD-10-CM

## 2013-11-19 DIAGNOSIS — I6389 Other cerebral infarction: Secondary | ICD-10-CM

## 2013-11-19 NOTE — Progress Notes (Signed)
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   Gregory Kerr  has suffered  stroke in 2007, with residual spastice left spastic hemiparesis, at home,  he has limited functional status, was able to use hemiwalker transfer himself in and out of wheelchair, wearing left foot ankle brace, hew as able to ambulate some.  he was started on Botox injection since 2011.,  He reported good response with the injection, the injection was done to loosen up his left upper extremity, it started to wear off in 8-10 weeks.  He also has past medical history of hypertension, anemia,     He has forceful  left finger/wrist flexion, limited ROM of left elbow, but he deines signficiant pain. He also has left knee extension, left ankle plantar flexion   UPDATE July 1st 2015: Last injection was in Oct 2014.  His sister is concerned about the the potential benefit vs long term side effect of repeated botulinum toxin injections. And patient is concerned about needle pain that is associated with the injection.  He did indicate that previous injections have been very helpful in relaxing his left shoulder, left elbow, left wrist and fingers, he did not notice significant side effect, desires further treatment.  Review of the system: Out of a complete 14 system review, the patient complains of only the following symptoms, and all other reviewed systems are negative.     Physical Exam  General: well nourished, well hydrated, no acute distress Head: normocephalic  Neck: supple, no masses, trachea midline Respiratory: clear to auscultation bilaterally. Cardiovascular: RRR Skin: bilateral upper chest and back small rashes. Trunk: slender   Neurologic Exam  Mental Status: in wheel chair, pleasant. mild slurred hoarse speeach Cranial Nerves: mild left lower face weakness. Motor: spastic left  hemiparesis. Left arm stayed in left shoulder internal rotation, addcution, left forearm pronation, shoulder abduction 3, elbow flexion 3, left wrist forceful flexed position, maximum 170 degree,  left elbow fixed flexion, pronation. maximum extension 160 degree. Left leg extension at knee, left ankle fixed plantar inversion.  Sensory: intact to primary modalities Coordination: normal right finger to nose Gait and Station: deferred    Assessment and Plan:   73 yo with history of stroke, spastic left hemiparesis, his questions about potential long-term botulism toxin injection side effect was explained to him, including muscle weakness, atrophy.  He did indicate that previous injections have been very helpful in relaxing his left shoulder, left elbow, left wrist and fingers, he did not notice significant side effect, desires further treatment.  I will proceed with continued EMG guided botulinum toxin injection, will not use electrical stimulation

## 2013-11-19 NOTE — Telephone Encounter (Signed)
Gregory Kerr:  Please schedule patient for EMG guided xeomin injection.

## 2013-12-18 ENCOUNTER — Non-Acute Institutional Stay (SKILLED_NURSING_FACILITY): Payer: PRIVATE HEALTH INSURANCE | Admitting: Internal Medicine

## 2013-12-18 ENCOUNTER — Encounter: Payer: Self-pay | Admitting: Internal Medicine

## 2013-12-18 DIAGNOSIS — M159 Polyosteoarthritis, unspecified: Secondary | ICD-10-CM | POA: Insufficient documentation

## 2013-12-18 DIAGNOSIS — I10 Essential (primary) hypertension: Secondary | ICD-10-CM | POA: Insufficient documentation

## 2013-12-18 DIAGNOSIS — F482 Pseudobulbar affect: Secondary | ICD-10-CM

## 2013-12-18 DIAGNOSIS — B351 Tinea unguium: Secondary | ICD-10-CM

## 2013-12-18 DIAGNOSIS — G8110 Spastic hemiplegia affecting unspecified side: Secondary | ICD-10-CM

## 2013-12-18 NOTE — Progress Notes (Signed)
Patient ID: Gregory Kerr, male   DOB: [birth year], 73 y.o.   MRN: [id number]    Facility: [facility]  Chief Complaint  Patient presents with  . Medical Management of Chronic Issues   Allergies  Allergen Reactions  . Penicillins Rash   HPI 73 y/o male patient is seen today for routine visit. He is at his baseline. He has history of CVA with left sided spastic hemiparesis. He uses a hemi walker, able to ambulate some and has left foot ankle brace. He follows with Dr Krista Blue from neurology and gets botox injection to help with his spasms.He has no concerns this visit. No concerns from staff.   Review of Systems   Constitutional: Negative for fever, chills and diaphoresis.   HENT: Negative for sore throat. Good dentition   Eyes: Negative for blurred vision. Wears corrective glasses   Respiratory: Negative for cough and shortness of breath.    Cardiovascular: Negative for chest pain, leg swelling and PND.   Gastrointestinal: Negative for heartburn, nausea, vomiting, abdominal pain and constipation.   Genitourinary: Negative for dysuria.   Skin: Negative for rash.   Neurological: Negative for dizziness, seizures, loss of consciousness, weakness and headaches.   Psychiatric/Behavioral: Negative for depression  Past Medical History  Diagnosis Date  . Stroke   . GERD (gastroesophageal reflux disease)   . Hypertension   . Hernia   . Hyperlipidemia   . HTN (hypertension) 10/24/2012  . Osteoporosis, unspecified 10/24/2012  . 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 Other and unspecified hyperlipidemia 10/24/2012  . Hiatal hernia 10/24/2012   Current Outpatient Prescriptions on File Prior to Visit  Medication Sig Dispense Refill  . amLODipine (NORVASC) 5 MG tablet Take 5 mg by mouth daily.      . baclofen (LIORESAL) 10 MG tablet Take 10 mg by mouth 4 (four) times daily.      . calcium carbonate (OS-CAL - DOSED IN MG OF ELEMENTAL  CALCIUM) 1250 MG tablet Take 1 tablet by mouth 2 (two) times daily with a meal. 500mg  elemental      . Cholecalciferol (VITAMIN D3) 2000 UNITS capsule Take 2,000 Units by mouth daily.      Marland Kitchen Dextromethorphan-Quinidine (NUEDEXTA) 20-10 MG CAPS Take 1 capsule by mouth daily.      Marland Kitchen dicyclomine (BENTYL) 20 MG tablet Take 10 mg by mouth 3 (three) times daily.       Marland Kitchen dipyridamole-aspirin (AGGRENOX) 200-25 MG per 12 hr capsule Take 1 capsule by mouth 2 (two) times daily.      . furosemide (LASIX) 20 MG tablet Take 20 mg by mouth daily.      Marland Kitchen gabapentin (NEURONTIN) 100 MG capsule Take 100 mg by mouth 3 (three) times daily.      Marland Kitchen omeprazole (PRILOSEC) 20 MG capsule Take 20 mg by mouth daily.      Marland Kitchen oxyCODONE (OXY IR/ROXICODONE) 5 MG immediate release tablet Take 1 tablet (5 mg total) by mouth every 6 (six) hours as needed for pain.  120 tablet  0  . simvastatin (ZOCOR) 20 MG tablet Take 30 mg by mouth every evening.       . terbinafine (LAMISIL) 250 MG tablet Take 250 mg by mouth daily.       No current facility-administered medications on file prior to visit.   Physical exam BP 110/63  Pulse 76  Temp(Src) 98.2 F (36.8 C)  Resp 18  Wt 157 lb 9.6  oz (71.487 kg)  SpO2 97%  Constitutional: He is oriented to person, place, and time. He appears well-developed and well-nourished. No distress.   Eyes: Conjunctivae are normal. Pupils are equal, round, and reactive to light. Right eye exhibits no discharge. Left eye exhibits no discharge.   Neck: No tracheal deviation present. No thyromegaly present.   Cardiovascular: Normal rate and regular rhythm.    Pulmonary/Chest: Effort normal and breath sounds normal.   Abdominal: Soft. Bowel sounds are normal.  Musculoskeletal: Left upper arm and left leg hemiparesis. Diminished ROM in left UE and LE. spasticity present. ROM normal with normal strength RUE and RLE Neurological: He is alert and oriented to person, place, and time.   Skin: Skin is warm and  dry. He is not diaphoretic.  Psychiatric: He has a normal mood and affect.   Labs- 08/13/12 na 138, k 3.7, bun 21, cr 0.92, glu 84, ca 8.8, t.chol 153, tg 85, ldl 102, hdl 34, vit d 63 12/05/12 t.chol 140, tg 98, ldl 81, hdl 39 02/03/13 wbc 10, hb 14.8, hct 45.1, plt 165, na 136, k 4, bun 17, cr 1, glu 93, ca 9.5 07/21/13 PSA 3.380  Assessment/plan  Spastic hemiplegia affecting non dominant side From old CVA, follows with Dr Krista Blue and gets botox injection q3 months. Continue baclofen, aggrenox, statin. Also on gabapentin and oxycodone.   HTN  bp well controlled at present. 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

## 2013-12-18 NOTE — Progress Notes (Signed)
Patient ID: Gregory Kerr, male   DOB: 01-01-1941, 73 y.o.   MRN: [id number]    [facility]  Allergies- PCN  Chief complaint- routine visit  HPI 73 y/o male patient is seen today for routine visit. He has history of CVA with left sided spastic hemiparesis. He has no concerns this visit. No concerns from staff.   Review of Systems   Constitutional: Negative for fever, chills and diaphoresis.   HENT: Negative for sore throat.  Good dentition   Eyes: Negative for blurred vision. Wears corrective glasses   Respiratory: Negative for cough and shortness of breath.    Cardiovascular: Negative for chest pain, leg swelling and PND.   Gastrointestinal: Negative for heartburn, nausea, vomiting, abdominal pain and constipation.   Genitourinary: Negative for dysuria.   Skin: Negative for rash.   Neurological: Negative for dizziness, seizures, loss of consciousness, weakness and headaches.   Psychiatric/Behavioral: Negative for depression  Medication reviewed. See MAR  Physical exam Vss, afebrile  Constitutional: He is oriented to person, place, and time. He appears well-developed and well-nourished. No distress.   Eyes: Conjunctivae are normal. Pupils are equal, round, and reactive to light. Right eye exhibits no discharge. Left eye exhibits no discharge.   Neck: No tracheal deviation present. No thyromegaly present.   Cardiovascular: Normal rate and regular rhythm.    Pulmonary/Chest: Effort normal and breath sounds normal.   Abdominal: Soft. Bowel sounds are normal.  Musculoskeletal: Left upper arm and left leg hemiparesis. Diminished ROM in left UE and LE. spasticity present Neurological: He is alert and oriented to person, place, and time.   Skin: Skin is warm and dry. He is not diaphoretic.  Psychiatric: He has a normal mood and affect.   Labs- 08/13/12 na 138, k 3.7, bun 21, cr 0.92, glu 84, ca 8.8, t.chol 153, tg 85, ldl 102, hdl 34, vit d 63 12/05/12 t.chol 140, tg 98,  ldl 81, hdl 39 02/03/13 wbc 10, hb 14.8, hct 45.1, plt 165, na 136, k 4, bun 17, cr 1, glu 93, ca 9.5 07/21/13 PSA 3.380  Assessment/plan  HTN  bp well controlled at present. Will continue amlodipine and lasix, monitor bmp  Hyperlipidemia Continue zocor daily  OA Pain under control with current regimen of oxycodone 5 mg 1 tab q4h prn

## 2014-01-12 ENCOUNTER — Encounter: Payer: Self-pay | Admitting: Internal Medicine

## 2014-01-12 ENCOUNTER — Non-Acute Institutional Stay (SKILLED_NURSING_FACILITY): Payer: PRIVATE HEALTH INSURANCE | Admitting: Internal Medicine

## 2014-01-12 DIAGNOSIS — M159 Polyosteoarthritis, unspecified: Secondary | ICD-10-CM

## 2014-01-12 DIAGNOSIS — G894 Chronic pain syndrome: Secondary | ICD-10-CM

## 2014-01-12 DIAGNOSIS — J449 Chronic obstructive pulmonary disease, unspecified: Secondary | ICD-10-CM

## 2014-01-12 DIAGNOSIS — E785 Hyperlipidemia, unspecified: Secondary | ICD-10-CM

## 2014-01-12 NOTE — Progress Notes (Signed)
Patient ID: Gregory Kerr, male   DOB: 10/13/1940, 73 y.o.   MRN: [id number]  Location:  [facility] Provider:  Blanchie Serve, MD  Code Status:  Full  Chief Complaint  Patient presents with  . Medical Management of Chronic Issues    HPI:  73 y/o male patient is seen today for routine visit. He has history of CVA with left sided spastic hemiparesis. He has no concerns this visit. No concerns from staff.   Review of Systems   Constitutional: Negative for fever, chills and diaphoresis.   HENT: Negative for sore throat.   Eyes: Negative for blurred vision. Wears corrective glasses   Respiratory: Negative for cough and shortness of breath.    Cardiovascular: Negative for chest pain, leg swelling and PND.   Gastrointestinal: Negative for heartburn, nausea, vomiting, abdominal pain and constipation.   Genitourinary: Negative for dysuria.   Skin: Negative for rash.   Neurological: Negative for dizziness, seizures, loss of consciousness, weakness and headaches.   Psychiatric/Behavioral: Negative for depression  Medications: Patient's Medications  New Prescriptions   No medications on file  Previous Medications   AMLODIPINE (NORVASC) 5 MG TABLET    Take 5 mg by mouth daily.   BACLOFEN (LIORESAL) 10 MG TABLET    Take 10 mg by mouth 4 (four) times daily.   CALCIUM CARBONATE (OS-CAL - DOSED IN MG OF ELEMENTAL CALCIUM) 1250 MG TABLET    Take 1 tablet by mouth 2 (two) times daily with a meal. 500mg  elemental   CHOLECALCIFEROL (VITAMIN D3) 2000 UNITS CAPSULE    Take 2,000 Units by mouth daily.   DEXTROMETHORPHAN-QUINIDINE (NUEDEXTA) 20-10 MG CAPS    Take 1 capsule by mouth daily.   DICYCLOMINE (BENTYL) 20 MG TABLET    Take 10 mg by mouth 3 (three) times daily.    DIPYRIDAMOLE-ASPIRIN (AGGRENOX) 200-25 MG PER 12 HR CAPSULE    Take 1 capsule by mouth 2 (two) times daily.   FUROSEMIDE (LASIX) 20 MG TABLET    Take 20 mg by mouth daily.   GABAPENTIN (NEURONTIN) 100 MG CAPSULE    Take 100 mg  by mouth 3 (three) times daily.   OMEPRAZOLE (PRILOSEC) 20 MG CAPSULE    Take 20 mg by mouth daily.   SIMVASTATIN (ZOCOR) 20 MG TABLET    Take 30 mg by mouth every evening. For hyperlipidemia /CVA   TERBINAFINE (LAMISIL) 250 MG TABLET    Take 250 mg by mouth daily.  Modified Medications   No medications on file  Discontinued Medications   OXYCODONE (OXY IR/ROXICODONE) 5 MG IMMEDIATE RELEASE TABLET    Take 1 tablet (5 mg total) by mouth every 6 (six) hours as needed for pain.    Physical Exam: Filed Vitals:   01/12/14 1143  BP: 106/66  Pulse: 80  Temp: 98.4 F (36.9 C)  Resp: 18  Height: 5\' 9"  (1.753 m)  Weight: 157 lb 9.6 oz (71.487 kg)  SpO2: 95%   Constitutional: He is oriented to person, place, and time. He appears well-developed and well-nourished. No distress.   Neck: No tracheal deviation present. No thyromegaly present.   Cardiovascular: Normal rate and regular rhythm.    Pulmonary/Chest: Effort normal and breath sounds normal.   Abdominal: Soft. Bowel sounds are normal.  Musculoskeletal: Left upper arm and left leg hemiparesis. Diminished ROM in left UE and LE. spasticity present Neurological: He is alert and oriented to person, place, and time.   Skin: Skin is warm and dry. He is  not diaphoretic.  Psychiatric: He has a normal mood and affect.   Labs reviewed: Basic Metabolic Panel:  Recent Labs  08/03/13  NA 142  K 4.0  BUN 20    Liver Function Tests:  Recent Labs  08/03/13  AST 16  ALT 15    CBC:  Recent Labs  08/03/13  WBC 5.0  HGB 14.4  HCT 45  PLT 159    Assessment/Plan  Chronic airway obstruction Breathing stable, not on any bronchodilators. Monitor clinically  Chronic pain syndrome Continue his oxycodone with baclofen and gabapentin  OA Oxycodone has been helpful  Hyperlipidemia Continue zocor

## 2014-01-15 DIAGNOSIS — J449 Chronic obstructive pulmonary disease, unspecified: Secondary | ICD-10-CM | POA: Insufficient documentation

## 2014-01-15 DIAGNOSIS — J4489 Other specified chronic obstructive pulmonary disease: Secondary | ICD-10-CM | POA: Insufficient documentation

## 2014-01-21 ENCOUNTER — Ambulatory Visit (INDEPENDENT_AMBULATORY_CARE_PROVIDER_SITE_OTHER): Payer: PRIVATE HEALTH INSURANCE | Admitting: Neurology

## 2014-01-21 ENCOUNTER — Encounter: Payer: Self-pay | Admitting: Neurology

## 2014-01-21 VITALS — BP 111/66 | HR 80

## 2014-01-21 DIAGNOSIS — G8110 Spastic hemiplegia affecting unspecified side: Secondary | ICD-10-CM

## 2014-01-21 DIAGNOSIS — G8310 Monoplegia of lower limb affecting unspecified side: Secondary | ICD-10-CM

## 2014-01-21 MED ORDER — INCOBOTULINUMTOXINA 100 UNITS IM SOLR
200.0000 [IU] | Freq: Once | INTRAMUSCULAR | Status: AC
  Administered 2014-01-21: 200 [IU] via INTRAMUSCULAR

## 2014-01-21 NOTE — Progress Notes (Signed)
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   Gregory Kerr  has suffered  stroke in 2007, with residual spastice left spastic hemiparesis, at home,  he has limited functional status, was able to use hemiwalker transfer himself in and out of wheelchair, wearing left foot ankle brace, hew as able to ambulate some.  he was started on Botox injection since 2011.,  He reported good response with the injection, the injection was done to loosen up his left upper extremity, it started to wear off in 8-10 weeks.  He also has past medical history of hypertension, anemia,     He has forceful  left finger/wrist flexion, limited ROM of left elbow, but he deines signficiant pain. He also has left knee extension, left ankle plantar flexion   UPDATE July 1st 2015: Last injection was in Oct 2014.  His sister is concerned about the the potential benefit vs long term side effect of repeated botulinum toxin injections. And patient is concerned about needle pain that is associated with the injection.  He did indicate that previous injections have been very helpful in relaxing his left shoulder, left elbow, left wrist and fingers, he did not notice significant side effect, desires further treatment.  UPDATE Sept 2nd 2015: He noticed increased specificity of his left arm, now he is quite few months out from previous injection, especially tightness of his left elbow, left shoulder, he denies significant benefit from previous injection to his left lower extremity, also worry about the side effect of causing weakness in his left hand  Review of the system: Out of a complete 14 system review, the patient complains of only the following symptoms, and all other reviewed systems are negative.     Physical Exam  General: well nourished, well hydrated, no acute distress Head: normocephalic  Neck:  supple, no masses, trachea midline Respiratory: clear to auscultation bilaterally. Cardiovascular: RRR Skin: bilateral upper chest and back small rashes. Trunk: slender   Neurologic Exam  Mental Status: in wheel chair, pleasant. mild slurred hoarse speeach Cranial Nerves: mild left lower face weakness. Motor: spastic left hemiparesis. Left arm stayed in left shoulder internal rotation, addcution, left forearm pronation, shoulder abduction 3, elbow flexion 3, left wrist forceful flexed position, maximum 170 degree,  left elbow fixed flexion, pronation. maximum extension 160 degree. Left leg extension at knee, left ankle fixed plantar inversion.  Sensory: intact to primary modalities Coordination: normal right finger to nose Gait and Station: deferred    Assessment and Plan:   73 yo with history of stroke, spastic left hemiparesis, his questions about potential long-term botulism toxin injection side effect was explained to him, including muscle weakness, atrophy.  He did indicate that previous injections have been very helpful in relaxing his left shoulder, left elbow, left wrist and fingers, he did not notice significant side effect, desires further treatment.  Under EMG guidance, 200 units of xeomin was injected into his spastic left upper extremity  Left biceps 25x2=50 Left brachialis 25 x3=75 Left brachial radialis 25 Left pectoralis major 25   Left pronator teres 25

## 2014-02-03 LAB — HEPATIC FUNCTION PANEL
ALT: 12 U/L (ref 10–40)
AST: 15 U/L (ref 14–40)

## 2014-02-03 LAB — BASIC METABOLIC PANEL
BUN: 18 mg/dL (ref 4–21)
CREATININE: 0.8 mg/dL (ref 0.6–1.3)
GLUCOSE: 75 mg/dL
POTASSIUM: 4.2 mmol/L (ref 3.4–5.3)
Sodium: 139 mmol/L (ref 137–147)

## 2014-02-03 LAB — CBC AND DIFFERENTIAL
HCT: 43 % (ref 41–53)
Hemoglobin: 13.8 g/dL (ref 13.5–17.5)
PLATELETS: 161 10*3/uL (ref 150–399)
WBC: 5.2 10*3/mL

## 2014-02-12 ENCOUNTER — Non-Acute Institutional Stay (SKILLED_NURSING_FACILITY): Payer: PRIVATE HEALTH INSURANCE | Admitting: Internal Medicine

## 2014-02-12 DIAGNOSIS — K589 Irritable bowel syndrome without diarrhea: Secondary | ICD-10-CM

## 2014-02-12 DIAGNOSIS — K219 Gastro-esophageal reflux disease without esophagitis: Secondary | ICD-10-CM

## 2014-02-12 DIAGNOSIS — E785 Hyperlipidemia, unspecified: Secondary | ICD-10-CM

## 2014-02-12 DIAGNOSIS — G8110 Spastic hemiplegia affecting unspecified side: Secondary | ICD-10-CM

## 2014-02-12 NOTE — Progress Notes (Signed)
Patient ID: Gregory Kerr, male   DOB: 1940-11-22, 73 y.o.   MRN: [id number]    [facility]  Allergies- PCN  Chief complaint- routine visit  HPI 73 y/o male patient is a long term care resident and seen today for routine visit. He has history of CVA with left sided spastic hemiparesis. He was seen in his room today. He has no concerns this visit. No concerns from staff. Working with restorative  Review of Systems   Constitutional: Negative for fever, chills and diaphoresis.   HENT: Negative for sore throat.  Good dentition   Eyes: Negative for blurred vision. Wears corrective glasses   Respiratory: Negative for cough and shortness of breath.    Cardiovascular: Negative for chest pain, leg swelling and PND.   Gastrointestinal: Negative for heartburn, nausea, vomiting, abdominal pain and constipation.   Genitourinary: Negative for dysuria.   Skin: Negative for rash.   Neurological: Negative for dizziness, seizures, loss of consciousness, weakness and headaches.   Psychiatric/Behavioral: Negative for depression    Current Outpatient Prescriptions on File Prior to Visit  Medication Sig Dispense Refill  . amLODipine (NORVASC) 5 MG tablet Take 5 mg by mouth daily.      . baclofen (LIORESAL) 10 MG tablet Take 10 mg by mouth 4 (four) times daily.      . calcium carbonate (OS-CAL - DOSED IN MG OF ELEMENTAL CALCIUM) 1250 MG tablet Take 1 tablet by mouth 2 (two) times daily with a meal. 500mg  elemental      . Cholecalciferol (VITAMIN D3) 2000 UNITS capsule Take 2,000 Units by mouth daily.      Marland Kitchen Dextromethorphan-Quinidine (NUEDEXTA) 20-10 MG CAPS Take 1 capsule by mouth daily.      Marland Kitchen dicyclomine (BENTYL) 20 MG tablet Take 10 mg by mouth 3 (three) times daily.       Marland Kitchen dipyridamole-aspirin (AGGRENOX) 200-25 MG per 12 hr capsule Take 1 capsule by mouth 2 (two) times daily.      . Efinaconazole (JUBLIA) 10 % SOLN Apply topically daily.      . furosemide (LASIX) 20 MG tablet Take 20 mg  by mouth daily.      Marland Kitchen gabapentin (NEURONTIN) 100 MG capsule Take 100 mg by mouth 3 (three) times daily.      Marland Kitchen omeprazole (PRILOSEC) 20 MG capsule Take 20 mg by mouth daily.      . simvastatin (ZOCOR) 20 MG tablet Take 30 mg by mouth every evening. For hyperlipidemia /CVA      . terbinafine (LAMISIL) 250 MG tablet Take 250 mg by mouth daily.      Marland Kitchen UNABLE TO FIND Take by mouth 2 (two) times daily. Replus 27-1 mg       No current facility-administered medications on file prior to visit.   Physical exam BP 119/75  Pulse 84  Temp(Src) 98 F (36.7 C)  Resp 18  SpO2 96%  Constitutional: He is oriented to person, place, and time. He appears well-developed and well-nourished. No distress.   Eyes: Conjunctivae are normal. Pupils are equal, round, and reactive to light. Right eye exhibits no discharge. Left eye exhibits no discharge.   Neck: No tracheal deviation present. No thyromegaly present.   Cardiovascular: Normal rate and regular rhythm.    Pulmonary/Chest: Effort normal and breath sounds normal.   Abdominal: Soft. Bowel sounds are normal.  Musculoskeletal: Left upper arm and left leg hemiparesis. Diminished ROM in left UE and LE. spasticity present Neurological: He is alert and  oriented to person, place, and time.   Skin: Skin is warm and dry. He is not diaphoretic.  Psychiatric: He has a normal mood and affect.   Labs- 08/13/12 na 138, k 3.7, bun 21, cr 0.92, glu 84, ca 8.8, t.chol 153, tg 85, ldl 102, hdl 34, vit d 63 12/05/12 t.chol 140, tg 98, ldl 81, hdl 39 02/03/13 wbc 10, hb 14.8, hct 45.1, plt 165, na 136, k 4, bun 17, cr 1, glu 93, ca 9.5 07/21/13 PSA 3.380 02/03/14 wbc 5.2, hb 13.8, hct 43.1, mcv 90.9, plt 161, na 139, k 4.2, bun 18, cr 0.8, glu 75, ca 9.3, t.chol 140, tg 132, ldl 78, hdl 36  Assessment/plan  Esophageal reflux Stable, continue prilosec  Hyperlipidemia Reviewed lipid panel, ldl at goal, decrease zocor to 10 mg daily  IBS Stable, continue  bentyl  Spastic hemiplegia affecting non dominant side From old CVA, follows with Dr Krista Blue and gets botox injection q3 months. Continue baclofen, aggrenox, gabapentin and oxycodone. Monitor clinically

## 2014-03-13 ENCOUNTER — Encounter: Payer: Self-pay | Admitting: Internal Medicine

## 2014-03-13 ENCOUNTER — Non-Acute Institutional Stay (SKILLED_NURSING_FACILITY): Payer: PRIVATE HEALTH INSURANCE | Admitting: Internal Medicine

## 2014-03-13 DIAGNOSIS — G8110 Spastic hemiplegia affecting unspecified side: Secondary | ICD-10-CM

## 2014-03-13 DIAGNOSIS — I10 Essential (primary) hypertension: Secondary | ICD-10-CM

## 2014-03-13 DIAGNOSIS — F482 Pseudobulbar affect: Secondary | ICD-10-CM

## 2014-03-13 DIAGNOSIS — K589 Irritable bowel syndrome without diarrhea: Secondary | ICD-10-CM

## 2014-03-13 NOTE — Progress Notes (Signed)
Patient ID: [name], male   DOB: 07-21-1940, 73 y.o.   MRN: [id number]   Place of Service: [facility]  Allergies  Allergen Reactions  . Penicillins Rash    Code Status: Full Code  Goals of Care: Longevity/Long term care  Chief Complaint  Patient presents with  . Medical Management of Chronic Issues    old CVA , HLD, GERD, IBS, and HTN    HPI 73 y.o. male with PMH of CVA with left-sided spastic hemiparesis, IBS, GERD, HLD, HTN, pseudobulbar affect among others is being seen for a routine visit. No complaints verbalized from patient. No falls or skin issues reported. Weight stable. NO change in functional status. No concern from staff.   Review of Systems Constitutional: Negative for fever, chills, and fatigue. HENT: Negative for facial swelling, ear pain, congestion, and sore throat Eyes: Negative for eye pain, eye discharge, and visual disturbance  Cardiovascular: Negative for chest pain, palpitations, and leg swelling Respiratory: Negative cough, shortness of breath, and wheezing.  Gastrointestinal: Negative for nausea and vomiting. Negative for abdominal pain, diarrhea and constipation.  Musculoskeletal: Negative for back pain, joint pain, and joint swelling  Neurological: Negative for dizziness, headache, weakness, and tremors.  Skin: Negative for rash and wound.   Psychiatric: Negative for nervous/anxious, agitation, depression, and suicidal ideas.   Past Medical History  Diagnosis Date  . Stroke   . GERD (gastroesophageal reflux disease)   . Hypertension   . Hernia   . Hyperlipidemia   . HTN (hypertension) 10/24/2012  . Osteoporosis, unspecified 10/24/2012  . 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 Other and unspecified hyperlipidemia 10/24/2012  . Hiatal hernia 10/24/2012    Past Surgical History  Procedure Laterality Date  . Appendectomy  05/27/2009    Gregory Collum, MD    History   Social History  .  Marital Status: Single    Spouse Name: N/A    Number of Children: N/A  . Years of Education: N/A   Occupational History  . Not on file.   Social History Main Topics  . Smoking status: Former Smoker    Types: Cigarettes  . Smokeless tobacco: Never Used  . Alcohol Use: No  . Drug Use: No  . Sexual Activity: Not on file   Other Topics Concern  . Not on file   Social History Narrative  . No narrative on file      Medication List       This list is accurate as of: 03/13/14 12:32 PM.  Always use your most recent med list.               amLODipine 5 MG tablet  Commonly known as:  NORVASC  Take 5 mg by mouth daily.     baclofen 10 MG tablet  Commonly known as:  LIORESAL  Take 10 mg by mouth 4 (four) times daily.     calcium carbonate 1250 MG tablet  Commonly known as:  OS-CAL - dosed in mg of elemental calcium  Take 1 tablet by mouth 2 (two) times daily with a meal. 500mg  elemental     dicyclomine 20 MG tablet  Commonly known as:  BENTYL  Take 10 mg by mouth 3 (three) times daily.     dipyridamole-aspirin 200-25 MG per 12 hr capsule  Commonly known as:  AGGRENOX  Take 1 capsule by mouth 2 (two) times daily.     furosemide 20 MG tablet  Commonly  known as:  LASIX  Take 20 mg by mouth daily.     gabapentin 100 MG capsule  Commonly known as:  NEURONTIN  Take 100 mg by mouth 3 (three) times daily.     NUEDEXTA 20-10 MG Caps  Generic drug:  Dextromethorphan-Quinidine  Take 1 capsule by mouth daily.     omeprazole 20 MG capsule  Commonly known as:  PRILOSEC  Take 20 mg by mouth daily.     PREPLUS 27-1 MG Tabs  Take 1 tablet by mouth 2 (two) times daily.     simvastatin 20 MG tablet  Commonly known as:  ZOCOR  Take 30 mg by mouth every evening. For hyperlipidemia /CVA     terbinafine 250 MG tablet  Commonly known as:  LAMISIL  Take 250 mg by mouth daily.     Vitamin D3 2000 UNITS capsule  Take 2,000 Units by mouth daily.        Physical  Exam Filed Vitals:   03/13/14 1223  BP: 109/63  Pulse: 62  Temp: 97.2 F (36.2 C)  Resp: 18   Constitutional: WDWN adult male in no acute distress. Conversational and answers questions appropriately HEENT: Normocephalic and atraumatic. PERRL. EOM intact. No icterus. No nasal discharge or sinus tenderness. Oral mucosa moist. Posterior pharynx clear of any exudate or lesions. Teeth and gingiva in good general condition.  Neck: Supple and nontender. No lymphadenopathy, masses, or thyromegaly. No JVD or carotid bruits. Cardiac: Normal S1, S2. RRR without appreciable murmurs, rubs, or gallops. Intact pulses intact. 1+ pitting leg edema bilaterally (chronic).  Lungs: No respiratory distress. Breath sounds clear bilaterally without rales, rhonchi, or wheezes. Abdomen: Audible bowel sounds in all quadrants. Soft, nontender, nondistended. No palpable mass.  Musculoskeletal:  No joint erythema or tenderness. Seen in wheel chair. Left hand contracture noted.  Spine and Back: No tenderness over spines. No CVA tenderness.  Skin: Warm and dry. No rash noted. No erythema.  Neurological: Alert and oriented to person, place, and time.  Psychiatric: Judgment and insight adequate. Appropriate mood and affect.   Labs Reviewed CBC Latest Ref Rng 02/03/2014 08/03/2013 04/10/2012  WBC - 5.2 5.0 13.3(H)  Hemoglobin 13.5 - 17.5 g/dL 13.8 14.4 14.9  Hematocrit 41 - 53 % 43 45 42.7  Platelets 150 - 399 K/L 161 159 249    CMP     Component Value Date/Time   NA 139 02/03/2014   NA 139 04/10/2012 2311   K 4.2 02/03/2014   CL 102 04/10/2012 2311   CO2 23 04/10/2012 2311   GLUCOSE 186* 04/10/2012 2311   BUN 18 02/03/2014   BUN 20 04/10/2012 2311   CREATININE 0.8 02/03/2014   CREATININE 0.79 04/10/2012 2311   CALCIUM 9.6 04/10/2012 2311   PROT 8.1 04/10/2012 2311   ALBUMIN 3.4* 04/10/2012 2311   AST 15 02/03/2014   ALT 12 02/03/2014   ALKPHOS 454* 04/10/2012 2311   BILITOT 1.3* 04/10/2012 2311   GFRNONAA  >90 04/10/2012 2311   GFRAA >90 04/10/2012 2311    Lipid Panel     Component Value Date/Time   CHOL 129 08/03/2013   TRIG 119 08/03/2013   HDL 32* 08/03/2013   LDLCALC 73 08/03/2013   Assessment & Plan 1. Spastic hemiplegia affecting nondominant side Ongoing. Continue baclofen 10mg  daily, gabapentin 100mg  three times daily, aggrenox 200/25mg  twice daily and monitor. BP controlled with current regimen. Continue lipid treatment with statin.   2. IBS (irritable bowel syndrome) Stable. Continue bentyl 10mg  three times  daily and monitor.   3. Essential hypertension Stable. Continue norvasc 5mg  daily and lasix 20mg  daily and monitor  4. Pseudobulbar affect Stable. Followed by neurologist Dr. Krista Blue. Continue Nuedexta and monitor for change in behaviors.    Family/Staff Communication Plan of care discuss with resident and professional staff members. Resident and professional staff members verbalize understanding and agree with plan of care. No additional questions or concerns reported.    Arthur Holms, MSN, AGNP-C [place], [place] [address] [phone] [8am-5pm] After hours: [phone]   I have personally reviewed this note and agree with the care plan  [facility], MD  [facility] [phone] (Monday-Friday 8 am - 5 pm) [phone] (afterhours)

## 2014-04-03 ENCOUNTER — Encounter (HOSPITAL_COMMUNITY): Payer: Self-pay | Admitting: *Deleted

## 2014-04-03 ENCOUNTER — Emergency Department (HOSPITAL_COMMUNITY): Payer: PRIVATE HEALTH INSURANCE

## 2014-04-03 ENCOUNTER — Observation Stay (HOSPITAL_COMMUNITY)
Admission: EM | Admit: 2014-04-03 | Discharge: 2014-04-06 | Disposition: A | Discharge status: Home / Self Care | Payer: PRIVATE HEALTH INSURANCE | Attending: Internal Medicine | Admitting: Internal Medicine

## 2014-04-03 DIAGNOSIS — R079 Chest pain, unspecified: Secondary | ICD-10-CM | POA: Diagnosis present

## 2014-04-03 DIAGNOSIS — Z8701 Personal history of pneumonia (recurrent): Secondary | ICD-10-CM | POA: Diagnosis not present

## 2014-04-03 DIAGNOSIS — J9601 Acute respiratory failure with hypoxia: Secondary | ICD-10-CM | POA: Diagnosis present

## 2014-04-03 DIAGNOSIS — I10 Essential (primary) hypertension: Secondary | ICD-10-CM | POA: Diagnosis present

## 2014-04-03 DIAGNOSIS — R072 Precordial pain: Principal | ICD-10-CM | POA: Insufficient documentation

## 2014-04-03 DIAGNOSIS — G894 Chronic pain syndrome: Secondary | ICD-10-CM

## 2014-04-03 DIAGNOSIS — K449 Diaphragmatic hernia without obstruction or gangrene: Secondary | ICD-10-CM | POA: Diagnosis not present

## 2014-04-03 DIAGNOSIS — Z8673 Personal history of transient ischemic attack (TIA), and cerebral infarction without residual deficits: Secondary | ICD-10-CM | POA: Diagnosis not present

## 2014-04-03 DIAGNOSIS — M6249 Contracture of muscle, multiple sites: Secondary | ICD-10-CM | POA: Insufficient documentation

## 2014-04-03 DIAGNOSIS — M810 Age-related osteoporosis without current pathological fracture: Secondary | ICD-10-CM | POA: Diagnosis not present

## 2014-04-03 DIAGNOSIS — Z87891 Personal history of nicotine dependence: Secondary | ICD-10-CM | POA: Diagnosis not present

## 2014-04-03 DIAGNOSIS — F482 Pseudobulbar affect: Secondary | ICD-10-CM | POA: Diagnosis not present

## 2014-04-03 DIAGNOSIS — Z880 Allergy status to penicillin: Secondary | ICD-10-CM | POA: Insufficient documentation

## 2014-04-03 DIAGNOSIS — Z79899 Other long term (current) drug therapy: Secondary | ICD-10-CM | POA: Insufficient documentation

## 2014-04-03 DIAGNOSIS — K219 Gastro-esophageal reflux disease without esophagitis: Secondary | ICD-10-CM | POA: Diagnosis not present

## 2014-04-03 DIAGNOSIS — I359 Nonrheumatic aortic valve disorder, unspecified: Secondary | ICD-10-CM

## 2014-04-03 DIAGNOSIS — E785 Hyperlipidemia, unspecified: Secondary | ICD-10-CM | POA: Diagnosis not present

## 2014-04-03 DIAGNOSIS — N390 Urinary tract infection, site not specified: Secondary | ICD-10-CM | POA: Diagnosis present

## 2014-04-03 HISTORY — DX: Pneumonia, unspecified organism: J18.9

## 2014-04-03 LAB — CBC
HCT: 42.4 % (ref 39.0–52.0)
HEMATOCRIT: 41.8 % (ref 39.0–52.0)
HEMOGLOBIN: 14 g/dL (ref 13.0–17.0)
Hemoglobin: 14.3 g/dL (ref 13.0–17.0)
MCH: 29.4 pg (ref 26.0–34.0)
MCH: 29.5 pg (ref 26.0–34.0)
MCHC: 33.5 g/dL (ref 30.0–36.0)
MCHC: 33.7 g/dL (ref 30.0–36.0)
MCV: 87.8 fL (ref 78.0–100.0)
MCV: 87.4 fL (ref 78.0–100.0)
Platelets: 139 10*3/uL — ABNORMAL LOW (ref 150–400)
Platelets: 156 10*3/uL (ref 150–400)
RBC: 4.76 MIL/uL (ref 4.22–5.81)
RBC: 4.85 MIL/uL (ref 4.22–5.81)
RDW: 13.8 % (ref 11.5–15.5)
RDW: 14 % (ref 11.5–15.5)
WBC: 12.6 10*3/uL — ABNORMAL HIGH (ref 4.0–10.5)
WBC: 13.2 10*3/uL — ABNORMAL HIGH (ref 4.0–10.5)

## 2014-04-03 LAB — URINE MICROSCOPIC-ADD ON

## 2014-04-03 LAB — URINALYSIS, ROUTINE W REFLEX MICROSCOPIC
Glucose, UA: NEGATIVE mg/dL
HGB URINE DIPSTICK: NEGATIVE
Ketones, ur: 15 mg/dL — AB
NITRITE: NEGATIVE
PH: 6.5 (ref 5.0–8.0)
Protein, ur: 30 mg/dL — AB
SPECIFIC GRAVITY, URINE: 1.022 (ref 1.005–1.030)
UROBILINOGEN UA: 1 mg/dL (ref 0.0–1.0)

## 2014-04-03 LAB — TROPONIN I
Troponin I: <0.3 ng/mL (ref <0.30)
Troponin I: <0.3 ng/mL (ref <0.30)

## 2014-04-03 LAB — BASIC METABOLIC PANEL
Anion gap: 17 — ABNORMAL HIGH (ref 5–15)
BUN: 18 mg/dL (ref 6–23)
CO2: 20 mEq/L (ref 19–32)
CREATININE: 0.92 mg/dL (ref 0.50–1.35)
Calcium: 8.9 mg/dL (ref 8.4–10.5)
Chloride: 103 mEq/L (ref 96–112)
GFR, EST NON AFRICAN AMERICAN: 83 mL/min — AB (ref >90)
GLUCOSE: 133 mg/dL — AB (ref 70–99)
POTASSIUM: 3.9 meq/L (ref 3.7–5.3)
Sodium: 140 mEq/L (ref 137–147)

## 2014-04-03 LAB — HEMOGLOBIN A1C
Hgb A1c MFr Bld: 5.6 % (ref <5.7)
Mean Plasma Glucose: 114 mg/dL (ref <117)

## 2014-04-03 LAB — I-STAT TROPONIN, ED: Troponin i, poc: 0.02 ng/mL (ref 0.00–0.08)

## 2014-04-03 LAB — PRO B NATRIURETIC PEPTIDE: Pro B Natriuretic peptide (BNP): 205.3 pg/mL — ABNORMAL HIGH (ref 0–125)

## 2014-04-03 LAB — CREATININE, SERUM
CREATININE: 0.92 mg/dL (ref 0.50–1.35)
GFR calc Af Amer: >90 mL/min (ref >90)
GFR, EST NON AFRICAN AMERICAN: 83 mL/min — AB (ref >90)

## 2014-04-03 MED ORDER — LORATADINE 10 MG PO TABS
10.0000 mg | ORAL_TABLET | Freq: Every day | ORAL | Status: DC | PRN
  Administered 2014-04-05: 10 mg via ORAL
  Filled 2014-04-03: qty 1

## 2014-04-03 MED ORDER — ACETAMINOPHEN 650 MG RE SUPP: 650.0000 mg | Freq: Four times a day (QID) | RECTAL | Status: DC | PRN

## 2014-04-03 MED ORDER — SIMVASTATIN 20 MG PO TABS
30.0000 mg | ORAL_TABLET | Freq: Every evening | ORAL | Status: DC
  Administered 2014-04-03 – 2014-04-05 (×3): 30 mg via ORAL
  Filled 2014-04-03 (×6): qty 1

## 2014-04-03 MED ORDER — ONDANSETRON HCL 4 MG/2ML IJ SOLN
4.0000 mg | Freq: Four times a day (QID) | INTRAMUSCULAR | Status: DC | PRN
  Administered 2014-04-03: 4 mg via INTRAVENOUS
  Filled 2014-04-03: qty 2

## 2014-04-03 MED ORDER — NITROGLYCERIN 0.4 MG SL SUBL: 0.4000 mg | SUBLINGUAL_TABLET | SUBLINGUAL | Status: DC | PRN

## 2014-04-03 MED ORDER — PANTOPRAZOLE SODIUM 40 MG PO TBEC
40.0000 mg | DELAYED_RELEASE_TABLET | Freq: Every day | ORAL | Status: DC
  Administered 2014-04-03 – 2014-04-06 (×4): 40 mg via ORAL
  Filled 2014-04-03 (×4): qty 1

## 2014-04-03 MED ORDER — MORPHINE SULFATE 4 MG/ML IJ SOLN
4.0000 mg | Freq: Once | INTRAMUSCULAR | Status: AC
  Administered 2014-04-03: 4 mg via INTRAVENOUS
  Filled 2014-04-03: qty 1

## 2014-04-03 MED ORDER — CALCIUM CARBONATE 1250 (500 CA) MG PO TABS
1.0000 | ORAL_TABLET | Freq: Two times a day (BID) | ORAL | Status: DC
  Administered 2014-04-03 – 2014-04-06 (×6): 500 mg via ORAL
  Filled 2014-04-03 (×6): qty 1

## 2014-04-03 MED ORDER — BACLOFEN 10 MG PO TABS
10.0000 mg | ORAL_TABLET | Freq: Four times a day (QID) | ORAL | Status: DC
  Administered 2014-04-03 – 2014-04-06 (×11): 10 mg via ORAL
  Filled 2014-04-03 (×11): qty 1

## 2014-04-03 MED ORDER — LEVOFLOXACIN 750 MG PO TABS
750.0000 mg | ORAL_TABLET | Freq: Every day | ORAL | Status: DC
  Administered 2014-04-03 – 2014-04-04 (×2): 750 mg via ORAL
  Filled 2014-04-03 (×2): qty 1

## 2014-04-03 MED ORDER — FUROSEMIDE 20 MG PO TABS
20.0000 mg | ORAL_TABLET | Freq: Every day | ORAL | Status: DC
  Administered 2014-04-03 – 2014-04-06 (×4): 20 mg via ORAL
  Filled 2014-04-03 (×4): qty 1

## 2014-04-03 MED ORDER — ACETAMINOPHEN 325 MG PO TABS
650.0000 mg | ORAL_TABLET | Freq: Four times a day (QID) | ORAL | Status: DC | PRN
  Administered 2014-04-03 – 2014-04-04 (×3): 650 mg via ORAL
  Filled 2014-04-03 (×3): qty 2

## 2014-04-03 MED ORDER — CETYLPYRIDINIUM CHLORIDE 0.05 % MT LIQD
7.0000 mL | Freq: Two times a day (BID) | OROMUCOSAL | Status: DC
  Administered 2014-04-03 – 2014-04-06 (×6): 7 mL via OROMUCOSAL

## 2014-04-03 MED ORDER — DICYCLOMINE HCL 20 MG PO TABS
10.0000 mg | ORAL_TABLET | Freq: Three times a day (TID) | ORAL | Status: DC
  Administered 2014-04-03 (×2): 10 mg via ORAL
  Filled 2014-04-03 (×6): qty 1

## 2014-04-03 MED ORDER — ONDANSETRON HCL 4 MG PO TABS: 4.0000 mg | ORAL_TABLET | Freq: Four times a day (QID) | ORAL | Status: DC | PRN

## 2014-04-03 MED ORDER — AMLODIPINE BESYLATE 5 MG PO TABS
5.0000 mg | ORAL_TABLET | Freq: Every day | ORAL | Status: DC
  Administered 2014-04-03 – 2014-04-06 (×4): 5 mg via ORAL
  Filled 2014-04-03 (×4): qty 1

## 2014-04-03 MED ORDER — MORPHINE SULFATE 2 MG/ML IJ SOLN: 2.0000 mg | INTRAMUSCULAR | Status: DC | PRN

## 2014-04-03 MED ORDER — SODIUM CHLORIDE 0.9 % IJ SOLN
3.0000 mL | Freq: Two times a day (BID) | INTRAMUSCULAR | Status: DC
  Administered 2014-04-03 – 2014-04-06 (×7): 3 mL via INTRAVENOUS

## 2014-04-03 MED ORDER — ASPIRIN-DIPYRIDAMOLE ER 25-200 MG PO CP12
1.0000 | ORAL_CAPSULE | Freq: Two times a day (BID) | ORAL | Status: DC
  Administered 2014-04-03 – 2014-04-06 (×7): 1 via ORAL
  Filled 2014-04-03 (×8): qty 1

## 2014-04-03 MED ORDER — GABAPENTIN 100 MG PO CAPS
100.0000 mg | ORAL_CAPSULE | Freq: Three times a day (TID) | ORAL | Status: DC
  Administered 2014-04-03 – 2014-04-06 (×8): 100 mg via ORAL
  Filled 2014-04-03 (×8): qty 1

## 2014-04-03 MED ORDER — HEPARIN SODIUM (PORCINE) 5000 UNIT/ML IJ SOLN
5000.0000 [IU] | Freq: Three times a day (TID) | INTRAMUSCULAR | Status: DC
  Administered 2014-04-03 – 2014-04-06 (×9): 5000 [IU] via SUBCUTANEOUS
  Filled 2014-04-03 (×9): qty 1

## 2014-04-03 NOTE — Plan of Care (Signed)
Problem: Problem: Skin/Wound Progression Goal: ADEQUATE MOBILITY PROGRESSION Outcome: Progressing MD paged for PT/OT orders. Awaiting call back. Goal: APPROPRIATE NUTRITIONAL STATUS Outcome: Progressing Good appetite. Patient ate most of his lunch. Goal: OTHER SKIN/WOUND GOAL(S) Outcome: Progressing

## 2014-04-03 NOTE — Plan of Care (Signed)
Problem: Consults Goal: Chest Pain Patient Education (See Patient Education module for education specifics.)  Outcome: Progressing Goal: Nutrition Consult-if indicated Outcome: Not Applicable Date Met:  60/15/61 Goal: Diabetes Guidelines if Diabetic/Glucose > 140 If diabetic or lab glucose is > 140 mg/dl - Initiate Diabetes/Hyperglycemia Guidelines & Document Interventions  Outcome: Not Applicable Date Met:  53/79/43  Problem: Phase I Progression Outcomes Goal: Hemodynamically stable Outcome: Completed/Met Date Met:  04/03/14 VSS Goal: Anginal pain relieved Outcome: Not Applicable Date Met:  27/61/47 Goal: Aspirin unless contraindicated Outcome: Completed/Met Date Met:  04/03/14 Patient on aggrenox Goal: MD aware of Cardiac Marker results Outcome: Completed/Met Date Met:  04/03/14 Troponin negative thus far. No need to notify MD. Goal: Other Phase I Outcomes/Goals Outcome: Completed/Met Date Met:  04/03/14 Patient chest pain free since admission [facility]

## 2014-04-03 NOTE — ED Notes (Signed)
Attempted report 

## 2014-04-03 NOTE — Plan of Care (Signed)
Problem: Problem: Skin/Wound Progression Goal: OTHER SKIN/WOUND GOAL(S) Patient will not develop skin breakdown r/t poor mobility on this admission Outcome: Progressing

## 2014-04-03 NOTE — Progress Notes (Signed)
  Echocardiogram 2D Echocardiogram has been performed.  Gregory Kerr 04/03/2014, 4:44 PM

## 2014-04-03 NOTE — ED Notes (Addendum)
Pt. From [facility] c/o CP. Pt. Was given aspirin and 3 nitro when EMS arrived. On EMS arrival pt. Was diaphoretic and had increased WOB. Pt. Was just seen in hospital for pneumonia and hasnt finished his course of ABX

## 2014-04-03 NOTE — Plan of Care (Signed)
Problem: Consults Goal: Skin Care Protocol Initiated - if Braden Score 18 or less If consults are not indicated, leave blank or document N/A  Outcome: Not Applicable Date Met:  74/12/87

## 2014-04-03 NOTE — ED Notes (Signed)
Patient transported to X-ray 

## 2014-04-03 NOTE — ED Provider Notes (Signed)
CSN: [id number]     Arrival date & time 04/03/14  6144 History   First MD Initiated Contact with Patient 04/03/14 0703     Chief Complaint  Patient presents with  . Chest Pain     (Consider location/radiation/quality/duration/timing/severity/associated sxs/prior Treatment) Patient is a 73 y.o. male presenting with chest pain. The history is provided by the patient.  Chest Pain Pain location:  Substernal area Pain quality: crushing   Pain radiates to:  Upper back and neck Pain radiates to the back: yes   Pain severity:  Moderate Onset quality:  Sudden Timing:  Constant Progression:  Improving Chronicity:  New Context: at rest   Relieved by:  Nothing Worsened by:  Nothing tried Associated symptoms: no cough, no fever and no shortness of breath     Past Medical History  Diagnosis Date  . Stroke   . GERD (gastroesophageal reflux disease)   . Hypertension   . Hernia   . Hyperlipidemia   . HTN (hypertension) 10/24/2012  . Osteoporosis, unspecified 10/24/2012  . 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 Other and unspecified hyperlipidemia 10/24/2012  . Hiatal hernia 10/24/2012   Past Surgical History  Procedure Laterality Date  . Appendectomy  05/27/2009    Ronnald Collum, MD   Family History  Problem Relation Age of Onset  . Heart attack Mother   . Heart attack Father   . Cancer Maternal Aunt     Stomach  . Cancer Paternal Aunt     Uterine,Colon   History  Substance Use Topics  . Smoking status: Former Smoker    Types: Cigarettes  . Smokeless tobacco: Never Used  . Alcohol Use: No    Review of Systems  Constitutional: Negative for fever.  Respiratory: Negative for cough and shortness of breath.   Cardiovascular: Positive for chest pain.  All other systems reviewed and are negative.     Allergies  Penicillins  Home Medications   Prior to Admission medications   Medication Sig Start Date End Date Taking? Authorizing Provider   acetaminophen (TYLENOL) 650 MG CR tablet Take 650 mg by mouth every 4 (four) hours as needed for pain or fever.   Yes Historical Provider, MD  amLODipine (NORVASC) 5 MG tablet Take 5 mg by mouth daily.   Yes Historical Provider, MD  baclofen (LIORESAL) 10 MG tablet Take 10 mg by mouth 4 (four) times daily.   Yes Historical Provider, MD  calcium carbonate (OS-CAL - DOSED IN MG OF ELEMENTAL CALCIUM) 1250 MG tablet Take 1 tablet by mouth 2 (two) times daily with a meal. 500mg  elemental   Yes Historical Provider, MD  Cholecalciferol (VITAMIN D3) 2000 UNITS capsule Take 2,000 Units by mouth daily.   Yes Historical Provider, MD  Dextromethorphan-Quinidine (NUEDEXTA) 20-10 MG CAPS Take 1 capsule by mouth daily.   Yes Historical Provider, MD  dicyclomine (BENTYL) 20 MG tablet Take 10 mg by mouth 3 (three) times daily.    Yes Historical Provider, MD  dipyridamole-aspirin (AGGRENOX) 200-25 MG per 12 hr capsule Take 1 capsule by mouth 2 (two) times daily.   Yes Historical Provider, MD  Efinaconazole (JUBLIA) 10 % SOLN Apply 1 application topically daily as needed (toenail fungus).   Yes Historical Provider, MD  furosemide (LASIX) 20 MG tablet Take 20 mg by mouth daily.   Yes Historical Provider, MD  gabapentin (NEURONTIN) 100 MG capsule Take 100 mg by mouth 3 (three) times daily.   Yes Historical Provider, MD  levofloxacin (  LEVAQUIN) 750 MG tablet Take 750 mg by mouth daily. For 6 days   Yes Historical Provider, MD  loratadine (CLARITIN) 10 MG tablet Take 10 mg by mouth daily as needed for allergies.   Yes Historical Provider, MD  nitroGLYCERIN (NITROSTAT) 0.4 MG SL tablet Place 0.4 mg under the tongue every 5 (five) minutes as needed for chest pain.   Yes Historical Provider, MD  omeprazole (PRILOSEC) 20 MG capsule Take 20 mg by mouth daily.   Yes Historical Provider, MD  Prenatal Vit-Fe Fumarate-FA (PREPLUS) 27-1 MG TABS Take 1 tablet by mouth 2 (two) times daily.   Yes Historical Provider, MD  simvastatin  (ZOCOR) 20 MG tablet Take 30 mg by mouth every evening. For hyperlipidemia /CVA   Yes Historical Provider, MD  terbinafine (LAMISIL) 250 MG tablet Take 250 mg by mouth daily.   Yes Historical Provider, MD   BP 113/65 mmHg  Pulse 88  Temp(Src) 98.1 F (36.7 C) (Oral)  Resp 25  Ht 5\' 9"  (1.753 m)  Wt 160 lb (72.576 kg)  BMI 23.62 kg/m2  SpO2 98% Physical Exam  Constitutional: He is oriented to person, place, and time. He appears well-developed and well-nourished. No distress.  HENT:  Head: Normocephalic and atraumatic.  Mouth/Throat: Oropharynx is clear and moist. No oropharyngeal exudate.  Eyes: EOM are normal. Pupils are equal, round, and reactive to light.  Neck: Normal range of motion. Neck supple.  Cardiovascular: Normal rate and regular rhythm.  Exam reveals no friction rub.   No murmur heard. Pulmonary/Chest: Effort normal and breath sounds normal. No respiratory distress. He has no wheezes. He has no rales.  Abdominal: Soft. He exhibits no distension. There is no tenderness. There is no rebound.  Musculoskeletal: Normal range of motion. He exhibits no edema.  Neurological: He is alert and oriented to person, place, and time. No cranial nerve deficit. He exhibits abnormal muscle tone (left sided weakness from prior stroke). Coordination normal.  Skin: No rash noted. He is not diaphoretic.  Nursing note and vitals reviewed.   ED Course  Procedures (including critical care time) Labs Review Labs Reviewed  CBC  BASIC METABOLIC PANEL  PRO B NATRIURETIC PEPTIDE  URINALYSIS, ROUTINE W REFLEX MICROSCOPIC  I-STAT [place], ED    Imaging Review Dg Chest 2 View  04/03/2014   CLINICAL DATA:  Pt experiencing CP today mostly on left side - was diagnosed with PNA 2 days ago in left lung, fever and SOB for several days - hx of stroke in 2007, htn - unable to lift left arm for lateral view  EXAM: CHEST  2 VIEW  COMPARISON:  04/07/2012  FINDINGS: Shallow lung inflation. Heart is  enlarged. Large hiatal hernia. No focal consolidations or pleural effusions. No pulmonary edema.  IMPRESSION: 1. Cardiomegaly. 2.  No focal acute pulmonary abnormality.   Electronically Signed   By: Shon Hale M.D.   On: 04/03/2014 08:31     EKG Interpretation   Date/Time:  Friday April 03 2014 07:07:56 EST Ventricular Rate:  91 PR Interval:  155 QRS Duration: 88 QT Interval:  439 QTC Calculation: 540 R Axis:   10 Text Interpretation:  Sinus rhythm Anterior infarct, old Borderline T  abnormalities, inferior leads Prolonged QT interval Baseline wander in  lead(s) V6 Similar to prior Confirmed by Mingo Amber  MD, [facility] ([id number]) on  04/03/2014 7:19:43 AM      MDM   Final diagnoses:  Chest pain    73 year old male with history of recent pneumonia,  strokes with residual left-sided deficits present with chest pain. Began last night. Described as crushing, substernal, radiating up to the neck and back. No prior history of chest pain like this before. No relief with nitroglycerin. Was given aspirin at his home. He was diaphoretic and had increased work of breathing with EMS. Patient from a nursing home. Here well-appearing, alert and oriented, vital stable. No palpable chest pain. Lungs are clear. Remainder of exam benign. Does have left-sided deficits. EKG is similar to prior. Plan for labs, troponin, admission.    Evelina Bucy, MD 04/03/14 [phone]

## 2014-04-03 NOTE — Plan of Care (Signed)
Problem: Consults Goal: Tobacco Cessation referral if indicated Outcome: Not Applicable Date Met:  70/62/37

## 2014-04-03 NOTE — Progress Notes (Signed)
MD notified of patient arrival to floor.

## 2014-04-03 NOTE — Discharge Planning (Signed)
Spoke with pt at bedside.  Pt originally form [place]; moved her to be closer to sister, [name].  Pt currently living in [facility] and plans to return there at discharge. Ortencia Askari J. Clydene Laming, [place], [place], [employer] [phone]

## 2014-04-03 NOTE — H&P (Signed)
[facility] History and Physical  Gregory Kerr:[id number] DOB: 1941/05/16 DOA: 04/03/2014  Referring physician: Emergency Department PCP: Blanchie Serve, MD  Specialists:   Chief Complaint: Chest pain  HPI: Gregory Kerr is a 73 y.o. 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 The patient is currently being treated for CAP with levaquin that was started as an outpatient. Pt denies pain with inspiration and denies heavy coughing. In the ED, initial trop was found to be unremarkable. Pt denied any improvement in sx with nitroglycerin, but sx did improve with morphine. EKG was unremarkable  Review of Systems:   Per above, the remainder of the 10pt ros reviewed and are neg  Past Medical History  Diagnosis Date  . Stroke   . GERD (gastroesophageal reflux disease)   . Hypertension   . Hernia   . Hyperlipidemia   . HTN (hypertension) 10/24/2012  . Osteoporosis, unspecified 10/24/2012  . 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 Other and unspecified hyperlipidemia 10/24/2012  . Hiatal hernia 10/24/2012   Past Surgical History  Procedure Laterality Date  . Appendectomy  05/27/2009    Ronnald Collum, MD   Social History:  reports that he has quit smoking. His smoking use included Cigarettes. He smoked 0.00 packs per day. He has never used smokeless tobacco. He reports that he does not drink alcohol or use illicit drugs.  where does patient live--home, ALF, SNF? and with whom if at home?  Can patient participate in ADLs?  Allergies  Allergen Reactions  . Penicillins Rash    Family History  Problem Relation Age of Onset  . Heart attack Mother   . Heart attack Father   . Cancer Maternal Aunt     Stomach  . Cancer Paternal [name]    (be sure to complete)  Prior to Admission medications    Medication Sig Start Date End Date Taking? Authorizing Provider  acetaminophen (TYLENOL) 650 MG CR tablet Take 650 mg by mouth every 4 (four) hours as needed for pain or fever.   Yes Historical Provider, MD  amLODipine (NORVASC) 5 MG tablet Take 5 mg by mouth daily.   Yes Historical Provider, MD  baclofen (LIORESAL) 10 MG tablet Take 10 mg by mouth 4 (four) times daily.   Yes Historical Provider, MD  calcium carbonate (OS-CAL - DOSED IN MG OF ELEMENTAL CALCIUM) 1250 MG tablet Take 1 tablet by mouth 2 (two) times daily with a meal. 500mg  elemental   Yes Historical Provider, MD  Cholecalciferol (VITAMIN D3) 2000 UNITS capsule Take 2,000 Units by mouth daily.   Yes Historical Provider, MD  Dextromethorphan-Quinidine (NUEDEXTA) 20-10 MG CAPS Take 1 capsule by mouth daily.   Yes Historical Provider, MD  dicyclomine (BENTYL) 20 MG tablet Take 10 mg by mouth 3 (three) times daily.    Yes Historical Provider, MD  dipyridamole-aspirin (AGGRENOX) 200-25 MG per 12 hr capsule Take 1 capsule by mouth 2 (two) times daily.   Yes Historical Provider, MD  Efinaconazole (JUBLIA) 10 % SOLN Apply 1 application topically daily as needed (toenail fungus).   Yes Historical Provider, MD  furosemide (LASIX) 20 MG tablet Take 20 mg by mouth daily.   Yes Historical Provider, MD  gabapentin (NEURONTIN) 100 MG capsule Take 100 mg by mouth 3 (three)  times daily.   Yes Historical Provider, MD  levofloxacin (LEVAQUIN) 750 MG tablet Take 750 mg by mouth daily. For 6 days   Yes Historical Provider, MD  loratadine (CLARITIN) 10 MG tablet Take 10 mg by mouth daily as needed for allergies.   Yes Historical Provider, MD  nitroGLYCERIN (NITROSTAT) 0.4 MG SL tablet Place 0.4 mg under the tongue every 5 (five) minutes as needed for chest pain.   Yes Historical Provider, MD  omeprazole (PRILOSEC) 20 MG capsule Take 20 mg by mouth daily.   Yes Historical Provider, MD  Prenatal Vit-Fe Fumarate-FA (PREPLUS) 27-1 MG TABS Take 1 tablet by mouth  2 (two) times daily.   Yes Historical Provider, MD  simvastatin (ZOCOR) 20 MG tablet Take 30 mg by mouth every evening. For hyperlipidemia /CVA   Yes Historical Provider, MD  terbinafine (LAMISIL) 250 MG tablet Take 250 mg by mouth daily.   Yes Historical Provider, MD   Physical Exam: Filed Vitals:   04/03/14 1006 04/03/14 1030 04/03/14 1100 04/03/14 1130  BP: 109/65 112/67 119/63 111/68  Pulse:  91 90 91  Temp:      TempSrc:      Resp: 20 23 21 21   Height:      Weight:      SpO2: 94% 96% 95% 92%     General:  Awake, in nad  Eyes: PERRL B  ENT: membranes moist, dentition fair  Neck: trachea midline, neck supple  Cardiovascular: regular, s1, s2  Respiratory: normal resp effort, no wheezing  Abdomen: soft, nondistended  Skin: normal skin turgor, no abnormal skin lesions seen  Musculoskeletal: perfused,no clubbing, LUE contracted  Psychiatric: mood/affect normal// no auditory/visual hallucinations  Neurologic: residual L sided facial droop and 4/5 LUE weakness, 3/5 LLE weakness - baseline from old CVA  Labs on Admission:  Basic Metabolic Panel:  Recent Labs Lab 04/03/14 0742  NA 140  K 3.9  CL 103  CO2 20  GLUCOSE 133*  BUN 18  CREATININE 0.92  CALCIUM 8.9   Liver Function Tests: No results for input(s): AST, ALT, ALKPHOS, BILITOT, PROT, ALBUMIN in the last 168 hours. No results for input(s): LIPASE, AMYLASE in the last 168 hours. No results for input(s): AMMONIA in the last 168 hours. CBC:  Recent Labs Lab 04/03/14 0742  WBC 12.6*  HGB 14.0  HCT 41.8  MCV 87.8  PLT 139*   Cardiac Enzymes: No results for input(s): CKTOTAL, CKMB, CKMBINDEX, TROPONINI in the last 168 hours.  BNP (last 3 results)  Recent Labs  04/03/14 0742  PROBNP 205.3*   CBG: No results for input(s): GLUCAP in the last 168 hours.  Radiological Exams on Admission: Dg Chest 2 View  04/03/2014   CLINICAL DATA:  Pt experiencing CP today mostly on left side - was diagnosed  with PNA 2 days ago in left lung, fever and SOB for several days - hx of stroke in 2007, htn - unable to lift left arm for lateral view  EXAM: CHEST  2 VIEW  COMPARISON:  04/07/2012  FINDINGS: Shallow lung inflation. Heart is enlarged. Large hiatal hernia. No focal consolidations or pleural effusions. No pulmonary edema.  IMPRESSION: 1. Cardiomegaly. 2.  No focal acute pulmonary abnormality.   Electronically Signed   By: Shon Hale M.D.   On: 04/03/2014 08:31    EKG: Independently reviewed. NSR  Assessment/Plan Principal Problem:   Chest pain Active Problems:   Osteoporosis   Chronic pain syndrome   Essential hypertension   1.  Chest pain 1. HEART score of 5 2. Currently pain free, not reproducible on exam 3. Initial trop and EKG unremarkable 4. Will follow serial trop and repeat ekg in AM 5. Will obtain 2d echo to r/o WMA 6. NTG and morphine PRN for pain 7. Admit to med-tele 8. Will check fasting lipids and a1c 2. HTN 1. Normotensive currently 2. Cont home meds 3. CAP 1. Will continue levaquin per home regimen 4. HLD 1. Cont statin per home regimen 2. Lipid panel per above 5. DVT prophylaxis 1. Heparin subQ 6. Hx of CVA with residual L sided weakness 1. Cont aggrenox per home regimen 2. Stable currently  Code Status: Full  Family Communication: Pt in room  Disposition Plan: Pending   Time spent: 7min  [facility], [facility] Pager [phone]  If 7PM-7AM, please contact night-coverage [email or web address] Password [email or web address] 04/03/2014, 11:56 AM

## 2014-04-03 NOTE — Progress Notes (Signed)
Spoke with Willaim Rayas, LPN at [facility]. Confirmed that patient did not receive any of his medications this morning

## 2014-04-04 ENCOUNTER — Encounter (HOSPITAL_COMMUNITY): Payer: Self-pay

## 2014-04-04 ENCOUNTER — Observation Stay (HOSPITAL_COMMUNITY): Payer: PRIVATE HEALTH INSURANCE

## 2014-04-04 DIAGNOSIS — K449 Diaphragmatic hernia without obstruction or gangrene: Secondary | ICD-10-CM | POA: Diagnosis not present

## 2014-04-04 DIAGNOSIS — R072 Precordial pain: Secondary | ICD-10-CM | POA: Diagnosis not present

## 2014-04-04 DIAGNOSIS — I10 Essential (primary) hypertension: Secondary | ICD-10-CM | POA: Diagnosis not present

## 2014-04-04 DIAGNOSIS — J9601 Acute respiratory failure with hypoxia: Secondary | ICD-10-CM

## 2014-04-04 DIAGNOSIS — K219 Gastro-esophageal reflux disease without esophagitis: Secondary | ICD-10-CM | POA: Diagnosis not present

## 2014-04-04 DIAGNOSIS — R079 Chest pain, unspecified: Secondary | ICD-10-CM

## 2014-04-04 DIAGNOSIS — N390 Urinary tract infection, site not specified: Secondary | ICD-10-CM | POA: Diagnosis present

## 2014-04-04 LAB — LIPID PANEL
CHOL/HDL RATIO: 7.7 ratio
Cholesterol: 138 mg/dL (ref 0–200)
HDL: 18 mg/dL — ABNORMAL LOW (ref >39)
LDL CALC: 91 mg/dL (ref 0–99)
Triglycerides: 147 mg/dL (ref <150)
VLDL: 29 mg/dL (ref 0–40)

## 2014-04-04 LAB — TROPONIN I

## 2014-04-04 MED ORDER — DICYCLOMINE HCL 10 MG PO CAPS
10.0000 mg | ORAL_CAPSULE | Freq: Three times a day (TID) | ORAL | Status: DC
  Administered 2014-04-04 – 2014-04-06 (×6): 10 mg via ORAL
  Filled 2014-04-04 (×4): qty 1

## 2014-04-04 MED ORDER — SODIUM CHLORIDE 0.9 % IV SOLN
INTRAVENOUS | Status: DC
  Administered 2014-04-04: 10:00:00 via INTRAVENOUS

## 2014-04-04 MED ORDER — IOHEXOL 350 MG/ML SOLN
80.0000 mL | Freq: Once | INTRAVENOUS | Status: AC | PRN
  Administered 2014-04-04: 80 mL via INTRAVENOUS

## 2014-04-04 MED ORDER — GI COCKTAIL ~~LOC~~
30.0000 mL | Freq: Three times a day (TID) | ORAL | Status: DC | PRN
  Administered 2014-04-04: 30 mL via ORAL
  Filled 2014-04-04: qty 30

## 2014-04-04 MED ORDER — CEFTRIAXONE SODIUM IN DEXTROSE 20 MG/ML IV SOLN
1.0000 g | Freq: Every day | INTRAVENOUS | Status: DC
  Administered 2014-04-04 – 2014-04-06 (×3): 1 g via INTRAVENOUS
  Filled 2014-04-04 (×3): qty 50

## 2014-04-04 NOTE — Plan of Care (Signed)
Problem: Phase II Progression Outcomes Goal: Hemodynamically stable Outcome: Completed/Met Date Met:  04/04/14 VSS, troponins negative Goal: Anginal pain relieved Outcome: Not Applicable Date Met:  38/38/18 Goal: Stress Test if indicated Outcome: Progressing Stress test scheduled for tomorrow  Problem: Problem: Skin/Wound Progression Goal: ADEQUATE MOBILITY PROGRESSION Outcome: Progressing PT worked with patient today. Patient heavy assist to chair. PT to continue to work with patient. Goal: APPROPRIATE NUTRITIONAL STATUS Outcome: Progressing Patient NPO for breakfast but was able to eat lunch Goal: OTHER SKIN/WOUND GOAL(S) Patient will not develop skin breakdown r/t poor mobility on this admission  Outcome: Progressing No signs of skin breakdown since admission

## 2014-04-04 NOTE — Progress Notes (Addendum)
Dr. Hartford Poli notified of chest pressure. MD ordered EKG.

## 2014-04-04 NOTE — Progress Notes (Addendum)
[facility] PROGRESS NOTE   RHYDIAN BALDI SEG:[id number] DOB: 01-06-1941 DOA: 04/03/2014 PCP: Blanchie Serve, MD  HPI/Subjective: Denies any complaints to me, later complained about substernal burning.  Assessment/Plan: Principal Problem:   Chest pain Active Problems:   Osteoporosis   Chronic pain syndrome   Essential hypertension    Chest pain Admitted to rule out acute coronary syndrome as he has substernal chest pain Nitroglycerin, morphine and GI cocktail as needed for pain. 12-lead EKG showed Q waves in inferior leads, cardiology consulted. Check CTA of the chest today. Reported to have pneumonia as outpatient, chest x-ray did not show any evidence of pneumonia. Continue aspirin and beta blockers.  Acute hypoxic respiratory failure Patient requires about 3 L of oxygen to keep his oxygen saturation above 90%. Does not appear to have marked fluid overload, no lower extremity edema. Chest x-ray without pulmonary edema, check CT angiography to rule out PE.  UTI On levofloxacin, will switch to Rocephin until the culture comes back.  Hypertension Stable, normotensive continue home medications.  Hyperlipidemia On statins, LDL is 91, continue home dose of simvastatin.  Code Status: full code Family Communication: Plan discussed with the patient. Disposition Plan: Remains inpatient   Consultants:  cardiology  Procedures:  none  Antibiotics:  Levofloxacin, switched to Rocephin   Objective: Filed Vitals:   04/04/14 0910  BP: 123/75  Pulse:   Temp:   Resp: 17    Intake/Output Summary (Last 24 hours) at 04/04/14 1256 Last data filed at 04/04/14 0916  Gross per 24 hour  Intake      3 ml  Output    650 ml  Net   -647 ml   Filed Weights   04/03/14 0656 04/03/14 1217 04/04/14 0451  Weight: 72.576 kg (160 lb) 75.297 kg (166 lb) 74.208 kg (163 lb 9.6 oz)    Exam: General: Alert and awake, oriented x3, not in any acute distress. HEENT:  anicteric sclera, pupils reactive to light and accommodation, EOMI CVS: S1-S2 clear, no murmur rubs or gallops Chest: clear to auscultation bilaterally, no wheezing, rales or rhonchi Abdomen: soft nontender, nondistended, normal bowel sounds, no organomegaly Extremities: no cyanosis, clubbing or edema noted bilaterally Neuro: Cranial nerves II-XII intact, no focal neurological deficits  Data Reviewed: Basic Metabolic Panel:  Recent Labs Lab 04/03/14 0742 04/03/14 1334  NA 140  --   K 3.9  --   CL 103  --   CO2 20  --   GLUCOSE 133*  --   BUN 18  --   CREATININE 0.92 0.92  CALCIUM 8.9  --    Liver Function Tests: No results for input(s): AST, ALT, ALKPHOS, BILITOT, PROT, ALBUMIN in the last 168 hours. No results for input(s): LIPASE, AMYLASE in the last 168 hours. No results for input(s): AMMONIA in the last 168 hours. CBC:  Recent Labs Lab 04/03/14 0742 04/03/14 1334  WBC 12.6* 13.2*  HGB 14.0 14.3  HCT 41.8 42.4  MCV 87.8 87.4  PLT 139* 156   Cardiac Enzymes:  Recent Labs Lab 04/03/14 1450 04/03/14 2024 04/04/14 0339  TROPONINI <0.30 <0.30 <0.30   BNP (last 3 results)  Recent Labs  04/03/14 0742  PROBNP 205.3*   CBG: No results for input(s): GLUCAP in the last 168 hours.  Micro No results found for this or any previous visit (from the past 240 hour(s)).   Studies: Dg Chest 2 View  04/03/2014   CLINICAL DATA:  Pt experiencing CP today mostly on left side -  was diagnosed with PNA 2 days ago in left lung, fever and SOB for several days - hx of stroke in 2007, htn - unable to lift left arm for lateral view  EXAM: CHEST  2 VIEW  COMPARISON:  04/07/2012  FINDINGS: Shallow lung inflation. Heart is enlarged. Large hiatal hernia. No focal consolidations or pleural effusions. No pulmonary edema.  IMPRESSION: 1. Cardiomegaly. 2.  No focal acute pulmonary abnormality.   Electronically Signed   By: Shon Hale M.D.   On: 04/03/2014 08:31    Scheduled Meds: .  amLODipine  5 mg Oral Daily  . antiseptic oral rinse  7 mL Mouth Rinse BID  . baclofen  10 mg Oral QID  . calcium carbonate  1 tablet Oral BID WC  . dicyclomine  10 mg Oral TID  . dipyridamole-aspirin  1 capsule Oral BID  . furosemide  20 mg Oral Daily  . gabapentin  100 mg Oral TID  . heparin  5,000 Units Subcutaneous 3 times per day  . levofloxacin  750 mg Oral Daily  . pantoprazole  40 mg Oral Daily  . simvastatin  30 mg Oral QPM  . sodium chloride  3 mL Intravenous Q12H   Continuous Infusions: . sodium chloride 75 mL/hr at 04/04/14 0944       Time spent: 35 minutes    [facility]  [facility] Pager [phone] If 7PM-7AM, please contact night-coverage at [email or web address], password [facility] 04/04/2014, 12:56 PM  LOS: 1 day

## 2014-04-04 NOTE — Progress Notes (Signed)
UR completed 

## 2014-04-04 NOTE — Evaluation (Signed)
Physical Therapy Evaluation Patient Details Name: Gregory Kerr MRN: [id number] DOB: 10-21-1940 Today's Date: 04/04/2014   History of Present Illness  Patient is a 73 y.o. male with a PMHx of CVA in 2007 with residual left-sided deficits, hypertension, hyperlipidemia, who was admitted to [facility] on 04/03/2014 for evaluation of chest discomfort. The patient is currently being treated for community-acquired pneumonia..   Clinical Impression  Pt admitted with the above. Pt currently with functional limitations due to the deficits listed below (see PT Problem List). Able to practice transfer training via squat pivot transfer to and from bed/chair, requiring mod assist. Pt reports he was able to perform most tasks at a Mod I level prior to admission. States he will have his sister bring his AFO so that he may practice ambulating with PT at next therapy session. Pt will benefit from skilled PT to increase their independence and safety with mobility to allow discharge to the venue listed below.       Follow Up Recommendations SNF    Equipment Recommendations  None recommended by PT    Recommendations for Other Services OT consult     Precautions / Restrictions Precautions Precautions: Fall Precaution Comments: Lt sided weakness from hx of CVA Required Braces or Orthoses: Other Brace/Splint (AFO for Lt foot drop - does not have here in hospital) Restrictions Weight Bearing Restrictions: No      Mobility  Bed Mobility Overal bed mobility: Needs Assistance Bed Mobility: Supine to Sit;Sit to Supine     Supine to sit: Min assist;HOB elevated Sit to supine: Min assist   General bed mobility comments: Very taxing effort for pt to complete. Heavy use of guard rail. Min assist for scoot forward using bed pad. Min assist for LE positioning in bed.  Transfers Overall transfer level: Needs assistance Equipment used: None Transfers: Squat Pivot Transfers     Squat pivot transfers: Mod  assist     General transfer comment: Educated on squat pivot transfers. Pt unable to stand due to leaving his AFO at SNF (plans to bring). Demonstrated to patient and requires VC throughout transfer to complete.  Mod assist to rise and block Lt knee but difficult to control extensor tone during transfer. Performed from bed to chair and back into bed.  Ambulation/Gait             General Gait Details: Pt states he cannot ambulate without his AFO  Stairs            Wheelchair Mobility    Modified Rankin (Stroke Patients Only)       Balance Overall balance assessment: Needs assistance Sitting-balance support: No upper extremity supported;Feet supported Sitting balance-Leahy Scale: Fair                                       Pertinent Vitals/Pain Pain Assessment: No/denies pain  At rest: HR 91 SpO2 94% on 4L supplemental O2 - mildy dyspneic - BP 94/78  After transfer training with PT: HR 97 SpO2 92% on 2L supplemental O2 - moderately dyspneic - BP 129/75  Per RN request: pt back on 4L supplemental O2 - SpO2 96%    Home Living Family/patient expects to be discharged to:: Skilled nursing facility Living Arrangements:  (Lives at [facility]) Available Help at Discharge: [place] Type of Home: [place]: Other (comment);Shower  seat;Walker - 2 wheels (Lt platform walker)      Prior Function Level of Independence: Independent with assistive device(s)         Comments: Uses platform walker for ambulation     Hand Dominance   Dominant Hand: Right    Extremity/Trunk Assessment   Upper Extremity Assessment: Defer to OT evaluation           Lower Extremity Assessment: LLE deficits/detail   LLE Deficits / Details: Hx of CVA with residual weakness. Signficant extensor tone with mobility. Very limited dorsiflexion, foot stays in plantar flexion at rest.     Communication   Communication: No  difficulties  Cognition Arousal/Alertness: Awake/alert Behavior During Therapy: WFL for tasks assessed/performed Overall Cognitive Status: Within Functional Limits for tasks assessed                      General Comments General comments (skin integrity, edema, etc.): Significant extensor tone in LLE and flexor tone in LUE. Pt repositioned in bed to decrease points of pressure over bony prominences and to maintain neutral positioning as best as able. Spent time discussing prior mobility and tasks pt was practicing at [facility] prior to admission.    Exercises General Exercises - Lower Extremity Ankle Circles/Pumps: AAROM;Both;10 reps;Supine      Assessment/Plan    PT Assessment Patient needs continued PT services  PT Diagnosis Difficulty walking;Hemiplegia dominant side   PT Problem List Decreased strength;Decreased range of motion;Decreased activity tolerance;Decreased balance;Decreased mobility;Cardiopulmonary status limiting activity;Pain  PT Treatment Interventions DME instruction;Gait training;Functional mobility training;Therapeutic activities;Therapeutic exercise;Balance training;Neuromuscular re-education;Patient/family education;Modalities   PT Goals (Current goals can be found in the Care Plan section) Acute Rehab PT Goals Patient Stated Goal: Go back to rehab PT Goal Formulation: With patient Time For Goal Achievement: 04/18/14 Potential to Achieve Goals: Good    Frequency Min 2X/week   Barriers to discharge        Co-evaluation               End of Session Equipment Utilized During Treatment: Gait belt;Oxygen (2-4L) Activity Tolerance: Patient tolerated treatment well;Treatment limited secondary to medical complications (Comment) (Time limited due to stat CT order) Patient left: in bed;with call bell/phone within reach;with nursing/sitter in room Nurse Communication: Mobility status;Other (comment) (SpO2 changes during therapy session)    Functional  Assessment Tool Used: clinical observation Functional Limitation: Mobility: Walking and moving around Mobility: Walking and Moving Around Current Status [phone]): At least 60 percent but less than 80 percent impaired, limited or restricted Mobility: Walking and Moving Around Goal Status [phone]): At least 40 percent but less than 60 percent impaired, limited or restricted    Time: 6948-5462 PT Time Calculation (min) (ACUTE ONLY): 36 min   Charges:   PT Evaluation $Initial PT Evaluation Tier I: 1 Procedure PT Treatments $Therapeutic Activity: 8-22 mins $Self Care/Home Management: 8-22   PT G Codes:   Functional Assessment Tool Used: clinical observation Functional Limitation: Mobility: Walking and moving around    Ellouise Newer 04/04/2014, 5:03 PM  Camille Bal [place], [place]

## 2014-04-04 NOTE — Progress Notes (Signed)
Dr. Hartford Poli text paged with result of EKG.

## 2014-04-04 NOTE — Consult Note (Addendum)
CONSULT NOTE  Date: 04/04/2014               Patient Name:  Gregory Kerr MRN: [id number]  DOB: 1941-03-17 Age / Sex: 73 y.o., male        PCP: Blanchie Serve Primary Cardiologist:  new - Nahser            Referring Physician: Hartford Poli              Reason for Consult:  chest discomfort            History of Present Illness: Patient is a 73 y.o. 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 The patient is currently being treated for community-acquired pneumonia..   Pain started 2 nights ago when he went to bed  Was located in the mid chest . was pressure , heaviness in quality .  Lasted all night long   Not associated with  sweats, did have dypsnea, no weakness or dizziness Modifying factors: not specifically better or worse with movement,  He was unable to take a deep breath     Medications: Outpatient medications: Prescriptions prior to admission  Medication Sig Dispense Refill Last Dose  . acetaminophen (TYLENOL) 650 MG CR tablet Take 650 mg by mouth every 4 (four) hours as needed for pain or fever.   unknown  . amLODipine (NORVASC) 5 MG tablet Take 5 mg by mouth daily.   unknown  . baclofen (LIORESAL) 10 MG tablet Take 10 mg by mouth 4 (four) times daily.   unknown  . calcium carbonate (OS-CAL - DOSED IN MG OF ELEMENTAL CALCIUM) 1250 MG tablet Take 1 tablet by mouth 2 (two) times daily with a meal. 500mg  elemental   unknown  . Cholecalciferol (VITAMIN D3) 2000 UNITS capsule Take 2,000 Units by mouth daily.   unknown  . Dextromethorphan-Quinidine (NUEDEXTA) 20-10 MG CAPS Take 1 capsule by mouth daily.   unknown  . dicyclomine (BENTYL) 20 MG tablet Take 10 mg by mouth 3 (three) times daily.    unknown  . dipyridamole-aspirin (AGGRENOX) 200-25 MG per 12 hr capsule Take 1 capsule by mouth 2 (two) times daily.   unknown  . Efinaconazole (JUBLIA)  10 % SOLN Apply 1 application topically daily as needed (toenail fungus).   unknown  . furosemide (LASIX) 20 MG tablet Take 20 mg by mouth daily.   unknown  . gabapentin (NEURONTIN) 100 MG capsule Take 100 mg by mouth 3 (three) times daily.   unknown  . levofloxacin (LEVAQUIN) 750 MG tablet Take 750 mg by mouth daily. For 6 days   unknown  . loratadine (CLARITIN) 10 MG tablet Take 10 mg by mouth daily as needed for allergies.   unknown  . omeprazole (PRILOSEC) 20 MG capsule Take 20 mg by mouth daily.   04/03/2014 at Unknown time  . Prenatal Vit-Fe Fumarate-FA (PREPLUS) 27-1 MG TABS Take 1 tablet by mouth 2 (two) times daily.   unknown  . simvastatin (ZOCOR) 20 MG tablet Take 30 mg by mouth every evening. For hyperlipidemia /CVA   unknown  . terbinafine (LAMISIL) 250 MG tablet Take 250 mg by mouth daily.   unknown  . nitroGLYCERIN (NITROSTAT) 0.4 MG SL tablet Place 0.4 mg under the tongue every 5 (five) minutes as needed for chest pain.   Given By EMS at Unknown time    Current medications:  Current Facility-Administered Medications  Medication Dose Route Frequency Provider Last Rate Last Dose  . 0.9 %  sodium chloride infusion   Intravenous Continuous Verlee Monte, MD 75 mL/hr at 04/04/14 0944    . acetaminophen (TYLENOL) tablet 650 mg  650 mg Oral Q6H PRN Donne Hazel, MD   650 mg at 04/03/14 2116   Or  . acetaminophen (TYLENOL) suppository 650 mg  650 mg Rectal Q6H PRN Donne Hazel, MD      . amLODipine (NORVASC) tablet 5 mg  5 mg Oral Daily Donne Hazel, MD   5 mg at 04/04/14 1937  . antiseptic oral rinse (CPC / CETYLPYRIDINIUM CHLORIDE 0.05%) solution 7 mL  7 mL Mouth Rinse BID Donne Hazel, MD   7 mL at 04/04/14 0924  . baclofen (LIORESAL) tablet 10 mg  10 mg Oral QID Donne Hazel, MD   10 mg at 04/04/14 9024  . calcium carbonate (OS-CAL - dosed in mg of elemental calcium) tablet 500 mg of elemental calcium  1 tablet Oral BID WC Donne Hazel, MD   500 mg of elemental calcium at  04/04/14 0915  . dicyclomine (BENTYL) capsule 10 mg  10 mg Oral TID Verlee Monte, MD   10 mg at 04/04/14 1203  . dipyridamole-aspirin (AGGRENOX) 200-25 MG per 12 hr capsule 1 capsule  1 capsule Oral BID Donne Hazel, MD   1 capsule at 04/04/14 0973  . furosemide (LASIX) tablet 20 mg  20 mg Oral Daily Donne Hazel, MD   20 mg at 04/04/14 5329  . gabapentin (NEURONTIN) capsule 100 mg  100 mg Oral TID Donne Hazel, MD   100 mg at 04/04/14 0919  . gi cocktail (Maalox,Lidocaine,Donnatal)  30 mL Oral TID PRN Verlee Monte, MD   30 mL at 04/04/14 0944  . heparin injection 5,000 Units  5,000 Units Subcutaneous 3 times per day Donne Hazel, MD   5,000 Units at 04/04/14 [phone]  . levofloxacin (LEVAQUIN) tablet 750 mg  750 mg Oral Daily Donne Hazel, MD   750 mg at 04/04/14 1145  . loratadine (CLARITIN) tablet 10 mg  10 mg Oral Daily PRN Donne Hazel, MD      . morphine 2 MG/ML injection 2 mg  2 mg Intravenous Q4H PRN Donne Hazel, MD      . nitroGLYCERIN (NITROSTAT) SL tablet 0.4 mg  0.4 mg Sublingual Q5 min PRN Donne Hazel, MD      . ondansetron [facility]) tablet 4 mg  4 mg Oral Q6H PRN Donne Hazel, MD       Or  . ondansetron [facility]) injection 4 mg  4 mg Intravenous Q6H PRN Donne Hazel, MD   4 mg at 04/03/14 1801  . pantoprazole (PROTONIX) EC tablet 40 mg  40 mg Oral Daily Donne Hazel, MD   40 mg at 04/04/14 0915  . simvastatin (ZOCOR) tablet 30 mg  30 mg Oral QPM Donne Hazel, MD   30 mg at 04/03/14 1837  . sodium chloride 0.9 % injection 3 mL  3 mL Intravenous Q12H Donne Hazel, MD   3 mL at 04/04/14 6834     Allergies  Allergen Reactions  . Penicillins Rash     Past Medical History  Diagnosis Date  . Stroke   . GERD (gastroesophageal reflux disease)   . Hypertension   . Hernia   . Hyperlipidemia   . HTN (hypertension) 10/24/2012  .  Osteoporosis, unspecified 10/24/2012  . 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 Other and unspecified  hyperlipidemia 10/24/2012  . Hiatal hernia 10/24/2012  . Pneumonia     Past Surgical History  Procedure Laterality Date  . Appendectomy  05/27/2009    Ronnald Collum, MD  . Cataract extraction Bilateral Patient unsure. States several years ago.    Family History  Problem Relation Age of Onset  . Heart attack Mother   . Heart attack Father   . Cancer Maternal Aunt     Stomach  . Cancer Paternal Aunt     Uterine,Colon    Social History:  reports that he quit smoking about 8 years ago. His smoking use included Cigarettes. He smoked 0.50 packs per day. He has never used smokeless tobacco. He reports that he does not drink alcohol or use illicit drugs.   Review of Systems: Constitutional:  admits to fever, chills,    HEENT: denies photophobia, eye pain, redness, hearing loss, ear pain, congestion, sore throat, rhinorrhea, sneezing, neck pain, neck stiffness and tinnitus.  Respiratory: admits to SOB, DOE, cough, chest tightness, and  + wheezing.  Cardiovascular: admits to chest pain, palpitations and leg swelling.  Gastrointestinal: denies nausea, vomiting, abdominal pain, diarrhea, constipation, blood in stool.  Genitourinary: denies dysuria, urgency, frequency, hematuria, flank pain and difficulty urinating.  Musculoskeletal: denies  myalgias, back pain, joint swelling, arthralgias and gait problem.   Skin: denies pallor, rash and wound.  Neurological: denies dizziness, seizures, syncope, weakness, light-headedness, numbness and headaches.   Hematological: denies adenopathy, easy bruising, personal or family bleeding history.  Psychiatric/ Behavioral: denies suicidal ideation, mood changes, confusion, nervousness, sleep disturbance and agitation.    Physical Exam: BP 123/75 mmHg  Pulse 78  Temp(Src) 98.5 F (36.9 C) (Oral)  Resp 17  Ht 5\' 9"  (1.753 m)  Wt 163 lb 9.6 oz (74.208 kg)  BMI 24.15 kg/m2  SpO2 94%  Wt Readings from Last 3 Encounters:  04/04/14 163 lb 9.6 oz (74.208  kg)  03/13/14 160 lb 12.8 oz (72.938 kg)  01/12/14 157 lb 9.6 oz (71.487 kg)    General: Vital signs reviewed and noted. Well-developed, well-nourished, in no acute distress; alert,   Head: Normocephalic, atraumatic, sclera anicteric,   Neck: Supple. Negative for carotid bruits. No JVD   Lungs:   few rhonchi  Heart: RRR with S1 S2. No murmurs, rubs, or gallops   Abdomen:  Soft, non-tender, non-distended with normoactive bowel sounds. No hepatomegaly. No rebound/guarding. No obvious abdominal masses   MSK: Strength and the appear normal for age.   Extremities: No clubbing or cyanosis. No edema.  Distal pedal pulses are 2+ and equal   Neurologic: Alert and oriented X 3.  He has residual weakness on his left side.  Psych: Responds to questions appropriately with a normal affect.     Lab results: Basic Metabolic Panel:  Recent Labs Lab 04/03/14 0742 04/03/14 1334  NA 140  --   K 3.9  --   CL 103  --   CO2 20  --   GLUCOSE 133*  --   BUN 18  --   CREATININE 0.92 0.92  CALCIUM 8.9  --     Liver Function Tests: No results for input(s): AST, ALT, ALKPHOS, BILITOT, PROT, ALBUMIN in the last 168 hours. No results for input(s): LIPASE, AMYLASE in the last 168 hours. No results for input(s): AMMONIA in the last 168 hours.  CBC:  Recent Labs Lab 04/03/14  7353 04/03/14 1334  WBC 12.6* 13.2*  HGB 14.0 14.3  HCT 41.8 42.4  MCV 87.8 87.4  PLT 139* 156    Cardiac Enzymes:  Recent Labs Lab 04/03/14 1450 04/03/14 2024 04/04/14 0339  TROPONINI <0.30 <0.30 <0.30    BNP: Invalid input(s): POCBNP  CBG: No results for input(s): GLUCAP in the last 168 hours.  Coagulation Studies: No results for input(s): LABPROT, INR in the last 72 hours.   Other results:  EKG:   NSR at 79.  Tiny Q waves inferiorly   Imaging: Dg Chest 2 View  04/03/2014   CLINICAL DATA:  Pt experiencing CP today mostly on left side - was diagnosed with PNA 2 days ago in left lung, fever and SOB  for several days - hx of stroke in 2007, htn - unable to lift left arm for lateral view  EXAM: CHEST  2 VIEW  COMPARISON:  04/07/2012  FINDINGS: Shallow lung inflation. Heart is enlarged. Large hiatal hernia. No focal consolidations or pleural effusions. No pulmonary edema.  IMPRESSION: 1. Cardiomegaly. 2.  No focal acute pulmonary abnormality.   Electronically Signed   By: Shon Hale M.D.   On: 04/03/2014 08:31        Assessment & Plan:  1. Chest discomfort: The patient presents with hours of chest discomfort and tightness. The symptoms are somewhat atypical. His EKG is not completely normal although his troponin levels are normal.  He's currently pain-free.  He has a history of CVA.  History of hypertension and hyperlipidemia.  We will schedule him for a stress Myoview study tomorrow. We will keep him nothing by mouth after midnight tonight.  2. Hypertension: The patient's blood pressures fairly well controlled. Continue same medications.   Thayer Headings, Brooke Bonito., MD, [facility] 04/04/2014, 1:08 PM Office - [phone] Pager [phone]

## 2014-04-05 ENCOUNTER — Observation Stay (HOSPITAL_COMMUNITY): Payer: PRIVATE HEALTH INSURANCE

## 2014-04-05 DIAGNOSIS — R072 Precordial pain: Secondary | ICD-10-CM | POA: Diagnosis not present

## 2014-04-05 DIAGNOSIS — R079 Chest pain, unspecified: Secondary | ICD-10-CM

## 2014-04-05 LAB — BASIC METABOLIC PANEL
Anion gap: 13 (ref 5–15)
BUN: 15 mg/dL (ref 6–23)
CO2: 22 meq/L (ref 19–32)
CREATININE: 0.74 mg/dL (ref 0.50–1.35)
Calcium: 8.7 mg/dL (ref 8.4–10.5)
Chloride: 103 mEq/L (ref 96–112)
GFR calc Af Amer: >90 mL/min (ref >90)
GFR calc non Af Amer: >90 mL/min (ref >90)
GLUCOSE: 124 mg/dL — AB (ref 70–99)
Potassium: 3.7 mEq/L (ref 3.7–5.3)
Sodium: 138 mEq/L (ref 137–147)

## 2014-04-05 LAB — CBC
HCT: 40.9 % (ref 39.0–52.0)
HEMOGLOBIN: 13.2 g/dL (ref 13.0–17.0)
MCH: 29.5 pg (ref 26.0–34.0)
MCHC: 32.3 g/dL (ref 30.0–36.0)
MCV: 91.5 fL (ref 78.0–100.0)
Platelets: 163 10*3/uL (ref 150–400)
RBC: 4.47 MIL/uL (ref 4.22–5.81)
RDW: 14.5 % (ref 11.5–15.5)
WBC: 7.4 10*3/uL (ref 4.0–10.5)

## 2014-04-05 MED ORDER — GUAIFENESIN ER 600 MG PO TB12
1200.0000 mg | ORAL_TABLET | Freq: Two times a day (BID) | ORAL | Status: DC
  Administered 2014-04-05 – 2014-04-06 (×2): 1200 mg via ORAL
  Filled 2014-04-05 (×2): qty 2

## 2014-04-05 MED ORDER — TECHNETIUM TC 99M SESTAMIBI GENERIC - CARDIOLITE
10.0000 | Freq: Once | INTRAVENOUS | Status: AC | PRN
  Administered 2014-04-05: 10 via INTRAVENOUS

## 2014-04-05 MED ORDER — REGADENOSON 0.4 MG/5ML IV SOLN
0.4000 mg | Freq: Once | INTRAVENOUS | Status: AC
  Administered 2014-04-05: 0.4 mg via INTRAVENOUS
  Filled 2014-04-05: qty 5

## 2014-04-05 MED ORDER — REGADENOSON 0.4 MG/5ML IV SOLN
INTRAVENOUS | Status: AC
  Administered 2014-04-05: 0.4 mg via INTRAVENOUS
  Filled 2014-04-05: qty 5

## 2014-04-05 MED ORDER — POTASSIUM CHLORIDE CRYS ER 20 MEQ PO TBCR
40.0000 meq | EXTENDED_RELEASE_TABLET | Freq: Once | ORAL | Status: AC
  Administered 2014-04-05: 40 meq via ORAL
  Filled 2014-04-05: qty 2

## 2014-04-05 MED ORDER — GUAIFENESIN-DM 100-10 MG/5ML PO SYRP: 5.0000 mL | ORAL_SOLUTION | ORAL | Status: DC | PRN

## 2014-04-05 MED ORDER — TECHNETIUM TC 99M SESTAMIBI - CARDIOLITE
30.0000 | Freq: Once | INTRAVENOUS | Status: AC | PRN
  Administered 2014-04-05: 12:00:00 30 via INTRAVENOUS

## 2014-04-05 NOTE — Progress Notes (Signed)
[facility] PROGRESS NOTE   Gregory Kerr CXK:[id number] DOB: [birth year] DOA: 04/03/2014 PCP: Blanchie Serve, MD  HPI/Subjective: Complaining about some flushing while he had the Myoview, but no chest pain.  Assessment/Plan: Principal Problem:   Chest pain Active Problems:   Osteoporosis   Chronic pain syndrome   Essential hypertension   UTI (urinary tract infection)   Acute respiratory failure with hypoxia    Chest pain Admitted to rule out acute coronary syndrome as he has substernal chest pain Nitroglycerin, morphine and GI cocktail as needed for pain. 12-lead EKG showed Q waves in inferior leads, cardiology consulted. Check CTA of the chest today. Reported to have pneumonia as outpatient, chest x-ray did not show any evidence of pneumonia. Continue aspirin and beta blockers. Cardiology consulted, Lexiscan Myoview stress test done earlier today showed hyperdynamic circulation with LVEF of 84% No evidence of reversible ischemia or infarction.  Acute hypoxic respiratory failure Patient requires about 3 L of oxygen to keep his oxygen saturation above 90%. Does not appear to have marked fluid overload, no lower extremity edema. Chest x-ray without pulmonary edema, check CT angiography to rule out PE. Low suspicion for infection, as he does not have fever or leukocytosis, no infiltrates on the x-ray.  UTI Was on levofloxacin, switched to Rocephin until the culture comes back.  Hypertension Stable, normotensive continue home medications.  Hyperlipidemia On statins, LDL is 91, continue home dose of simvastatin.  Code Status: full code Family Communication: Plan discussed with the patient. Disposition Plan: Remains inpatient   Consultants:  cardiology  Procedures:  none  Antibiotics:  Levofloxacin, switched to Rocephin   Objective: Filed Vitals:   04/05/14 1155  BP: 134/81  Pulse: 85  Temp:   Resp:     Intake/Output Summary (Last 24 hours)  at 04/05/14 1641 Last data filed at 04/05/14 1333  Gross per 24 hour  Intake      3 ml  Output    675 ml  Net   -672 ml   Filed Weights   04/03/14 0656 04/03/14 1217 04/04/14 0451  Weight: 72.576 kg (160 lb) 75.297 kg (166 lb) 74.208 kg (163 lb 9.6 oz)    Exam: General: Alert and awake, oriented x3, not in any acute distress. HEENT: anicteric sclera, pupils reactive to light and accommodation, EOMI CVS: S1-S2 clear, no murmur rubs or gallops Chest: clear to auscultation bilaterally, no wheezing, rales or rhonchi Abdomen: soft nontender, nondistended, normal bowel sounds, no organomegaly Extremities: no cyanosis, clubbing or edema noted bilaterally Neuro: Cranial nerves II-XII intact, no focal neurological deficits  Data Reviewed: Basic Metabolic Panel:  Recent Labs Lab 04/03/14 0742 04/03/14 1334 04/05/14 0330  NA 140  --  138  K 3.9  --  3.7  CL 103  --  103  CO2 20  --  22  GLUCOSE 133*  --  124*  BUN 18  --  15  CREATININE 0.92 0.92 0.74  CALCIUM 8.9  --  8.7   Liver Function Tests: No results for input(s): AST, ALT, ALKPHOS, BILITOT, PROT, ALBUMIN in the last 168 hours. No results for input(s): LIPASE, AMYLASE in the last 168 hours. No results for input(s): AMMONIA in the last 168 hours. CBC:  Recent Labs Lab 04/03/14 0742 04/03/14 1334 04/05/14 0330  WBC 12.6* 13.2* 7.4  HGB 14.0 14.3 13.2  HCT 41.8 42.4 40.9  MCV 87.8 87.4 91.5  PLT 139* 156 163   Cardiac Enzymes:  Recent Labs Lab 04/03/14 1450 04/03/14 2024  04/04/14 0339  TROPONINI <0.30 <0.30 <0.30   BNP (last 3 results)  Recent Labs  04/03/14 0742  PROBNP 205.3*   CBG: No results for input(s): GLUCAP in the last 168 hours.  Micro Recent Results (from the past 240 hour(s))  Urine culture     Status: None (Preliminary result)   Collection Time: 04/04/14  7:37 AM  Result Value Ref Range Status   Specimen Description URINE, RANDOM  Final   Special Requests NONE  Final   Culture   Setup Time   Final    04/04/2014 20:50 Performed at [place] PENDING  Incomplete   Culture   Final    Culture reincubated for better growth Performed at [facility]    Report Status PENDING  Incomplete     Studies: Ct Angio Chest Pe W/cm &/or Wo Cm  04/04/2014   CLINICAL DATA:  SOB (shortness of breath) R06.02 (ICD-10-CM)Chest pain, unspecified chest pain type R07.9 (ICD-10-CM)  EXAM: CT ANGIOGRAPHY CHEST WITH CONTRAST  TECHNIQUE: Multidetector CT imaging of the chest was performed using the standard protocol during bolus administration of intravenous contrast. Multiplanar CT image reconstructions and MIPs were obtained to evaluate the vascular anatomy.  CONTRAST:  2mL OMNIPAQUE IOHEXOL 350 MG/ML SOLN  COMPARISON:  Chest radiograph of 1 day prior.  No prior CT.  FINDINGS: Lungs/Pleura: Mild motion degradation throughout. Hiatal hernia with adjacent left base collapse/consolidative change. Favored to represent atelectasis. Mild right base dependent atelectasis.  No pleural fluid.  Heart/Mediastinum: The quality of this examination for evaluation of pulmonary embolism is moderate. No pulmonary embolism to the large segmental level. Motion especially degrades evaluation of the posterior right upper lobe.  Aortic atherosclerosis. Ascending aorta tortuous without aneurysm or dissection. Mild to moderate cardiomegaly with coronary artery atherosclerosis. No pericardial effusion. No mediastinal or hilar adenopathy. Large hiatal hernia with approximately 1/2 of the stomach position within the chest. No obstruction.  Upper Abdomen: Cholelithiasis. Normal imaged portions of the spleen, stomach, liver, pancreas, adrenal glands.  Bones/Musculoskeletal:  No acute osseous abnormality.  Review of the MIP images confirms the above findings.  IMPRESSION: 1. Moderate quality exam, with motion degradation. No evidence of large pulmonary embolism. 2.  Atherosclerosis, including  within the coronary arteries. 3. Large hiatal hernia with probable atelectasis within the adjacent compressed lower lobe. 4. Cholelithiasis.   Electronically Signed   By: Abigail Miyamoto M.D.   On: 04/04/2014 17:56   Nm Myocar Multi W/spect W/wall Motion / Ef  04/05/2014   CLINICAL DATA:  Chest pain, unspecified  EXAM: MYOCARDIAL IMAGING WITH SPECT (REST AND PHARMACOLOGIC-STRESS)  GATED LEFT VENTRICULAR WALL MOTION STUDY  LEFT VENTRICULAR EJECTION FRACTION  TECHNIQUE: Standard myocardial SPECT imaging was performed after resting intravenous injection of 10 mCi Tc-50m sestamibi. Subsequently, intravenous infusion of Lexiscan was performed under the supervision of the Cardiology staff. At peak effect of the drug, 30 mCi Tc-36m sestamibi was injected intravenously and standard myocardial SPECT imaging was performed. Quantitative gated imaging was also performed to evaluate left ventricular wall motion, and estimate left ventricular ejection fraction.  COMPARISON:  None.  FINDINGS: Perfusion: No decreased activity in the left ventricle on stress imaging to suggest reversible ischemia or infarction.  Wall Motion: Normal left ventricular wall motion. No left ventricular dilation.  Left Ventricular Ejection Fraction: 84 %  End diastolic volume 45 ml  End systolic volume 7 ml  IMPRESSION: 1. No reversible ischemia or infarction.  2. Normal left ventricular wall  motion.  3. Left ventricular ejection fraction 84%  4. Low-risk stress test findings*.  *2012 Appropriate Use Criteria for Coronary Revascularization Focused Update: J Am Coll Cardiol. 8003;49(1):791-505. http://content.[email or web address].aspx?articleid=[email or web address]   Electronically Signed   By: Daryll Brod M.D.   On: 04/05/2014 15:26    Scheduled Meds: . amLODipine  5 mg Oral Daily  . antiseptic oral rinse  7 mL Mouth Rinse BID  . baclofen  10 mg Oral QID  . calcium carbonate  1 tablet Oral BID WC  . cefTRIAXone (ROCEPHIN)  IV  1 g Intravenous Daily  .  dicyclomine  10 mg Oral TID  . dipyridamole-aspirin  1 capsule Oral BID  . furosemide  20 mg Oral Daily  . gabapentin  100 mg Oral TID  . heparin  5,000 Units Subcutaneous 3 times per day  . pantoprazole  40 mg Oral Daily  . simvastatin  30 mg Oral QPM  . sodium chloride  3 mL Intravenous Q12H   Continuous Infusions:       Time spent: 35 minutes    [facility]  [facility] Pager [phone] If 7PM-7AM, please contact night-coverage at [email or web address], password [email or web address] 04/05/2014, 4:41 PM  LOS: 2 days

## 2014-04-05 NOTE — Progress Notes (Signed)
Utilization Review Completed.   Teasia Zapf, RN, BSN Nurse Case Manager  

## 2014-04-05 NOTE — Plan of Care (Signed)
Problem: Phase II Progression Outcomes Goal: Stress Test if indicated Outcome: Completed/Met Date Met:  04/05/14 Negative stress test Goal: Cath/PCI Day Path if indicated Outcome: Not Applicable Date Met:  22/29/79 Goal: CV Risk Factors identified Outcome: Completed/Met Date Met:  04/05/14 Goal: Cardiac Rehab if ordered Outcome: Not Applicable Date Met:  89/21/19 Goal: If positive for MI, change to MI Path Outcome: Not Applicable Date Met:  41/74/08 Goal: Other Phase II Outcomes/Goals Outcome: Not Applicable Date Met:  14/48/18  Problem: Phase III Progression Outcomes Goal: Hemodynamically stable Outcome: Progressing Patient weaned off O2 today and thus far maintaining O2 saturation of 94% on RA Goal: No anginal pain Outcome: Completed/Met Date Met:  04/05/14 Goal: Cath/PCI Path as indicated Outcome: Not Applicable Date Met:  56/31/49 Goal: Vascular site scale level 0 - I Vascular Site Scale Level 0: No bruising/bleeding/hematoma Level I (Mild): Bruising/Ecchymosis, minimal bleeding/ooozing, palpable hematoma < 3 cm Level II (Moderate): Bleeding not affecting hemodynamic parameters, pseudoaneurysm, palpable hematoma > 3 cm Level III (Severe) Bleeding which affects hemodynamic parameters or retroperitoneal hemorrhage  Outcome: Not Applicable Date Met:  70/26/37 Goal: Tolerating diet Outcome: Progressing Patient had no episodes of N/V today Goal: If positive for MI, change to MI Path Outcome: Not Applicable Date Met:  85/88/50  Problem: Problem: Skin/Wound Progression Goal: ADEQUATE MOBILITY PROGRESSION Outcome: Progressing Patient with increased bed mobility. Able to sit on side of bed with very minimal assistance. Goal: APPROPRIATE NUTRITIONAL STATUS Outcome: Progressing Patient able to tolerate all meals today without N/V Goal: OTHER SKIN/WOUND GOAL(S) Patient will not develop skin breakdown r/t poor mobility on this admission  Outcome: Progressing No signs of skin  breakdown

## 2014-04-05 NOTE — Progress Notes (Signed)
   Reviewed nuclear stress test findings. No scar or ischemia, EF 84%, low risk. Continue medical Rx.  Nm Myocar Multi W/spect W/wall Motion / Ef     IMPRESSION: 1. No reversible ischemia or infarction.  2. Normal left ventricular wall motion.  3. Left ventricular ejection fraction 84%  4. Low-risk stress test findings*.  *2012 Appropriate Use Criteria for Coronary Revascularization Focused Update: J Am Coll Cardiol. 8101;75(1):025-852. http://content.[email or web address].aspx?articleid=1201161   Electronically Signed   By: Daryll Brod M.D.   On: 04/05/2014 15:26    Signed,  Richardson Dopp, PA-C   04/05/2014 4:46 PM

## 2014-04-05 NOTE — Progress Notes (Signed)
Primary cardiologist:  Dr. Liam Rogers   Subjective:    Gregory Kerr is a 73 y.o. 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 The patient is currently being treated for community-acquired pneumonia.  CEs have been negative.  No chest pain this AM.  Objective:   Temp:  [98 F (36.7 C)-98.3 F (36.8 C)] 98.3 F (36.8 C) (11/15 0529) Pulse Rate:  [74-87] 74 (11/15 0529) Resp:  [18] 18 (11/15 0529) BP: (113-129)/(70-75) 113/71 mmHg (11/15 0529) SpO2:  [94 %-99 %] 99 % (11/15 0529) Last BM Date: 04/02/14  Filed Weights   04/03/14 0656 04/03/14 1217 04/04/14 0451  Weight: 160 lb (72.576 kg) 166 lb (75.297 kg) 163 lb 9.6 oz (74.208 kg)    Intake/Output Summary (Last 24 hours) at 04/05/14 1040 Last data filed at 04/04/14 2300  Gross per 24 hour  Intake      0 ml  Output   1000 ml  Net  -1000 ml     Exam:  General:  NAD  HEENT:  Neck no JVD  Lungs:  CTA  Cardiac:  RRR  Abdomen:  soft  Extremities:  No edema  Lab Results:  Basic Metabolic Panel:  Recent Labs Lab 04/03/14 0742 04/03/14 1334 04/05/14 0330  NA 140  --  138  K 3.9  --  3.7  CL 103  --  103  CO2 20  --  22  GLUCOSE 133*  --  124*  BUN 18  --  15  CREATININE 0.92 0.92 0.74  CALCIUM 8.9  --  8.7    Liver Function Tests: No results for input(s): AST, ALT, ALKPHOS, BILITOT, PROT, ALBUMIN in the last 168 hours.  CBC:  Recent Labs Lab 04/03/14 0742 04/03/14 1334 04/05/14 0330  WBC 12.6* 13.2* 7.4  HGB 14.0 14.3 13.2  HCT 41.8 42.4 40.9  MCV 87.8 87.4 91.5  PLT 139* 156 163    Cardiac Enzymes:  Recent Labs Lab 04/03/14 1450 04/03/14 2024 04/04/14 0339  TROPONINI <0.30 <0.30 <0.30    BNP:  Recent Labs  04/03/14 0742  PROBNP 205.3*    Coagulation: No results for input(s): INR in the last 168 hours.     Medications:   Scheduled  Medications: . amLODipine  5 mg Oral Daily  . antiseptic oral rinse  7 mL Mouth Rinse BID  . baclofen  10 mg Oral QID  . calcium carbonate  1 tablet Oral BID WC  . cefTRIAXone (ROCEPHIN)  IV  1 g Intravenous Daily  . dicyclomine  10 mg Oral TID  . dipyridamole-aspirin  1 capsule Oral BID  . furosemide  20 mg Oral Daily  . gabapentin  100 mg Oral TID  . heparin  5,000 Units Subcutaneous 3 times per day  . pantoprazole  40 mg Oral Daily  . regadenoson      . regadenoson  0.4 mg Intravenous Once  . simvastatin  30 mg Oral QPM  . sodium chloride  3 mL Intravenous Q12H     Infusions:     PRN Medications:  acetaminophen **OR** acetaminophen, gi cocktail, loratadine, morphine injection, nitroGLYCERIN, ondansetron **OR** ondansetron (ZOFRAN) IV, technetium sestamibi   Assessment:   1. Chest Pain - Chest CT with coronary calcifications >> NST today 2. Hypoxic Respiratory Failure - ? pneumonia 3. HTN 4. Hyperlipidemia 5. UTI   Plan/Discussion:    [employer]  Myoview performed this AM - Patient tolerated well.  Complained of flushing. ECGs:  No changes to suggest ischemia. Images pending.   Richardson Dopp, PA-C   04/05/2014 10:40 AM Pager [phone]      I have seen, examined the patient, and reviewed the above assessment and plan.  Changes to above are made where necessary. Will review myoview later today.  If low risk, I would recommend medical therapy.  If high risk then further CV evaluation may be required.  Please call with questions  Co Sign: Thompson Grayer, MD 04/05/2014 11:38 AM

## 2014-04-05 NOTE — Plan of Care (Signed)
Problem: Phase I Progression Outcomes Goal: Voiding-avoid urinary catheter unless indicated Outcome: Completed/Met Date Met:  04/05/14  Problem: Phase III Progression Outcomes Goal: Hemodynamically stable Outcome: Completed/Met Date Met:  04/05/14

## 2014-04-06 DIAGNOSIS — R072 Precordial pain: Secondary | ICD-10-CM | POA: Diagnosis not present

## 2014-04-06 LAB — BASIC METABOLIC PANEL
ANION GAP: 13 (ref 5–15)
BUN: 17 mg/dL (ref 6–23)
CO2: 22 mEq/L (ref 19–32)
Calcium: 8.8 mg/dL (ref 8.4–10.5)
Chloride: 105 mEq/L (ref 96–112)
Creatinine, Ser: 0.77 mg/dL (ref 0.50–1.35)
GFR calc Af Amer: >90 mL/min (ref >90)
GFR, EST NON AFRICAN AMERICAN: 90 mL/min — AB (ref >90)
Glucose, Bld: 98 mg/dL (ref 70–99)
Potassium: 4.2 mEq/L (ref 3.7–5.3)
SODIUM: 140 meq/L (ref 137–147)

## 2014-04-06 LAB — URINE CULTURE

## 2014-04-06 LAB — TSH: TSH: 2.41 u[IU]/mL (ref 0.350–4.500)

## 2014-04-06 MED ORDER — CEFUROXIME AXETIL 500 MG PO TABS: 500.0000 mg | ORAL_TABLET | Freq: Two times a day (BID) | ORAL | Status: DC

## 2014-04-06 MED ORDER — CARVEDILOL 3.125 MG PO TABS: 3.1250 mg | ORAL_TABLET | Freq: Two times a day (BID) | ORAL | Status: DC

## 2014-04-06 MED ORDER — METOPROLOL TARTRATE 25 MG PO TABS: 12.5000 mg | ORAL_TABLET | Freq: Two times a day (BID) | ORAL | Status: DC

## 2014-04-06 NOTE — Progress Notes (Signed)
Physical Therapy Treatment Patient Details Name: Gregory Kerr MRN: [id number] DOB: 1940-12-26 Today's Date: 04/06/2014    History of Present Illness Patient is a 73 y.o. male with a PMHx of CVA in 2007 with residual left-sided deficits, hypertension, hyperlipidemia, who was admitted to [facility] on 04/03/2014 for evaluation of chest discomfort. The patient is currently being treated for community-acquired pneumonia..     PT Comments    Pt admitted with above. Pt currently with functional limitations due to strength and tone deficits limiting mobility.  Pt plans to return to SNF today.  Pt will benefit from skilled PT to increase their independence and safety with mobility to allow discharge to the venue listed below.   Follow Up Recommendations   SNF     Equipment Recommendations    TBA   Recommendations for Other Services       Precautions / Restrictions Precautions Precautions: Fall Precaution Comments: Lt sided weakness from hx of CVA Required Braces or Orthoses: Other Brace/Splint (AFO for Lt foot drop - does not have here in hospital) Restrictions Weight Bearing Restrictions: No    Mobility  Bed Mobility Overal bed mobility: Needs Assistance;+2 for physical assistance Bed Mobility: Supine to Sit;Sit to Supine     Supine to sit: Min assist;HOB elevated Sit to supine: Min assist   General bed mobility comments: Very taxing effort for pt to complete. Heavy use of guard rail. Min assist for scoot forward using bed pad. Min assist for LE positioning in bed.  Transfers                 General transfer comment: Pt did not want to get up again to chair as OT got him up earlier and he doesn't have his AFO.    Ambulation/Gait                 Stairs            Wheelchair Mobility    Modified Rankin (Stroke Patients Only)       Balance Overall balance assessment: Needs assistance Sitting-balance support: No upper extremity supported;Feet  supported Sitting balance-Leahy Scale: Fair                              Cognition Arousal/Alertness: Awake/alert Behavior During Therapy: WFL for tasks assessed/performed Overall Cognitive Status: Within Functional Limits for tasks assessed                      Exercises General Exercises - Lower Extremity Ankle Circles/Pumps: AAROM;Both;10 reps;Supine Long Arc Quad: AAROM;Both;10 reps;Seated Heel Slides: AAROM;Both;10 reps;Seated Hip Flexion/Marching: AAROM;Both;10 reps;Seated    General Comments General comments (skin integrity, edema, etc.): Continues with significant extensor tone in LLE and flexor tone in LUE.        Pertinent Vitals/Pain Pain Assessment: No/denies pain  VSS    Home Living                      Prior Function            PT Goals (current goals can now be found in the care plan section) Progress towards PT goals: Not progressing toward goals - comment (family did not bring braces in so pt a little limited)    Frequency       PT Plan Current plan remains appropriate    Co-evaluation  End of Session   Activity Tolerance: Patient tolerated treatment well Patient left: in bed;with call bell/phone within reach;with bed alarm set     Time: 5638-7564 PT Time Calculation (min) (ACUTE ONLY): 24 min  Charges:  $Therapeutic Exercise: 8-22 mins                    G CodesIrwin Brakeman Kerr April 11, 2014, 10:57 AM Gregory Kerr Acute Rehabilitation [phone] (pager)

## 2014-04-06 NOTE — Evaluation (Signed)
Occupational Therapy Evaluation Patient Details Name: Gregory Kerr MRN: [id number] DOB: [birth year] Today's Date: 04/06/2014    History of Present Illness Patient is a 73 y.o. male with a PMHx of CVA in 2007 with residual left-sided deficits, hypertension, hyperlipidemia, who was admitted to [facility] on 04/03/2014 for evaluation of chest discomfort. The patient is currently being treated for community-acquired pneumonia..    Clinical Impression   Pt admitted with above. Pt requiring Min A with LB dressing, PTA. Feel pt will benefit from acute OT to increase independence with BADLs, PTA.     Follow Up Recommendations  SNF;Supervision/Assistance - 24 hour    Equipment Recommendations  Other (comment) (defer to next venue)    Recommendations for Other Services       Precautions / Restrictions Precautions Precautions: Fall Precaution Comments: Lt sided weakness from hx of CVA Required Braces or Orthoses: Other Brace/Splint (AFO for left foot and splint for LUE-not here) Restrictions Weight Bearing Restrictions: No      Mobility Bed Mobility Overal bed mobility: Needs Assistance;+2 for physical assistance Bed Mobility: Sit to Supine;Supine to Sit     Supine to sit: Supervision Sit to supine: Mod assist   General bed mobility comments: assist to scoot HOB.  Assist with LE's when returning to bed.  Transfers Overall transfer level: Needs assistance   Transfers: Sit to/from [employer] Transfers Sit to Stand: Max assist   Squat pivot transfers: Mod assist;Max assist     General transfer comment: Attempted sit to stand but could not fully achieve due to RLE (did not have AFO). Cues and assist to scoot to edge of bed/chair.    Balance                                            ADL Overall ADL's : Needs assistance/impaired     Grooming: Wash/dry face;Sitting;Minimal assistance   Upper Body Bathing: Minimal assitance;Sitting  (educated/demonstrated technique )   Lower Body Bathing: Moderate assistance;Sitting/lateral leans       Lower Body Dressing: Moderate assistance;Sitting/lateral leans   Toilet Transfer: Moderate assistance;Maximal assistance;Squat-pivot (bed <> chair)           Functional mobility during ADLs: Moderate assistance;Maximal assistance (squat pivot) General ADL Comments: Discussed elastic shoe laces for shoes. Reviewed dressing technique. Educated on alternative technique for LB ADLs (supine/rolling, leaning). Educated on breathing technique and energy conservation as pt breathing heavily in session. Performed ADLs in session.      Vision                     Perception     Praxis      Pertinent Vitals/Pain Pain Assessment: No/denies pain     Hand Dominance Right   Extremity/Trunk Assessment Upper Extremity Assessment Upper Extremity Assessment: LUE deficits/detail LUE Deficits / Details: residual weakness/high tone in RUE (flexion synergy pattern) from previous CVA   Lower Extremity Assessment Lower Extremity Assessment: Defer to PT evaluation       Communication Communication Communication: No difficulties   Cognition Arousal/Alertness: Awake/alert Behavior During Therapy: WFL for tasks assessed/performed Overall Cognitive Status: Within Functional Limits for tasks assessed                     General Comments       Exercises       Shoulder Instructions  Home Living Family/patient expects to be discharged to:: Skilled nursing facility     Type of Home: [place]: Other (comment);Shower seat;Walker - 2 wheels (left platform walker)          Prior Functioning/Environment Level of Independence: Needs assistance  ADLs: Min assist with LB dressing       Comments: Uses platform walker for ambulation    OT Diagnosis: Generalized weakness   OT Problem List: Decreased  strength;Impaired tone;Impaired UE functional use;Impaired balance (sitting and/or standing);Decreased activity tolerance;Decreased knowledge of use of DME or AE;Decreased knowledge of precautions;Decreased coordination   OT Treatment/Interventions: Self-care/ADL training;DME and/or AE instruction;Therapeutic activities;Patient/family education;Balance training;Therapeutic exercise;Energy conservation    OT Goals(Current goals can be found in the care plan section) Acute Rehab OT Goals Patient Stated Goal: not stated OT Goal Formulation: With patient Time For Goal Achievement: 04/20/14 Potential to Achieve Goals: Good ADL Goals Pt Will Perform Lower Body Bathing: with set-up;with supervision;sit to/from stand;sitting/lateral leans Pt Will Perform Lower Body Dressing: with set-up;with supervision;sitting/lateral leans;sit to/from stand Pt Will Transfer to Toilet: with supervision;ambulating Pt Will Perform Toileting - Clothing Manipulation and hygiene: with set-up;with supervision;sit to/from stand;sitting/lateral leans  OT Frequency: Min 2X/week   Barriers to D/C:            Co-evaluation              End of Session Equipment Utilized During Treatment: Gait belt  Activity Tolerance: Patient limited by fatigue;Patient tolerated treatment well Patient left: in bed;with call bell/phone within reach   Time: 0911-0938 OT Time Calculation (min): 27 min Charges:  OT General Charges $OT Visit: 1 Procedure OT Evaluation $Initial OT Evaluation Tier I: 1 Procedure OT Treatments $Self Care/Home Management : 8-22 mins G-Codes: OT G-codes **NOT FOR INPATIENT CLASS** Functional Assessment Tool Used: clinical judgment Functional Limitation: Self care Self Care Current Status (H6073): At least 40 percent but less than 60 percent impaired, limited or restricted Self Care Goal Status (X1062): At least 1 percent but less than 20 percent impaired, limited or restricted  Benito Mccreedy  OTR/L [phone] 04/06/2014, 2:01 PM

## 2014-04-06 NOTE — Progress Notes (Signed)
CSW (Clinical Social Worker) prepared pt dc packet and placed with shadow chart. CSW arranged non-emergent ambulance transport for 2pm. Pt, pt family, pt nurse, and facility informed. CSW signing off.   Gregory Kerr, LCSWA [phone]  

## 2014-04-06 NOTE — Discharge Summary (Signed)
Physician Discharge Summary  Gregory Kerr ERX:[id number] DOB: Oct 13, 1940 DOA: 04/03/2014  PCP: Blanchie Serve, MD  Admit date: 04/03/2014 Discharge date: 04/06/2014  Time spent: 40 minutes  Recommendations for Outpatient Follow-up:  1. Follow-up with primary care physician week. 2. Ceftin for 5 more days.  Discharge Diagnoses:  Principal Problem:   Chest pain Active Problems:   Osteoporosis   Chronic pain syndrome   Essential hypertension   UTI (urinary tract infection)   Acute respiratory failure with hypoxia   Discharge Condition: Stable  Diet recommendation: heart healthy   Filed Weights   04/03/14 1217 04/04/14 0451 04/06/14 0530  Weight: 75.297 kg (166 lb) 74.208 kg (163 lb 9.6 oz) 72.938 kg (160 lb 12.8 oz)    History of present illness:  Gregory Kerr is a 73 y.o. 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 The patient is currently being treated for CAP with levaquin that was started as an outpatient. Pt denies pain with inspiration and denies heavy coughing. In the ED, initial trop was found to be unremarkable. Pt denied any improvement in sx with nitroglycerin, but sx did improve with morphine. EKG was unremarkable  Hospital Course:    Chest pain Admitted to rule out acute coronary syndrome as he has substernal chest pain Nitroglycerin, morphine and GI cocktail as needed for pain. 12-lead EKG showed Q waves in inferior leads, cardiology consulted. Reported to have pneumonia as outpatient, chest x-ray did not show any evidence of pneumonia. CTA done to rule out PE and showed coronary calcification. Continue aspirin and beta blockers. Lexiscan Myoview stress test showed no reversible ischemia or infarction. Cardiology recommended to continue management with beta blockers and aspirin, patient is on Aggrenox for  CVA. Placed on low-dose of metoprolol. Unclear to me the cause of his chest pain, this could be secondary to episode of acute bronchitis which is resolved.  Acute hypoxic respiratory failure Patient requires about 3 L of oxygen to keep his oxygen saturation above 90%. Does not appear to have marked fluid overload, no lower extremity edema. Chest x-ray without pulmonary edema, CT angiography of the chest showed no evidence of PE or other pathology. Low suspicion for infection, as he does not have fever or leukocytosis, no infiltrates on the x-ray. Was able to wean off of the oxygen to room air. Sats in the mid 90s, he is not short of breath. Reportedly he was getting treatment for pneumonia as outpatient, likely he had acute bronchitis which is improved.  UTI Was on levofloxacin, switched to Rocephin until the culture comes back.  Urine culture showed multiple morphotype, patient did very well on Rocephin so discharged on Ceftin for 5 more days. Patient listed as allergic to penicillins but tolerated cephalosporins very well.  Hypertension Stable, normotensive continue home medications.  Hyperlipidemia On statins, LDL is 91, continue home dose of simvastatin.   Procedures:  Stress test, showed normal left ventricular wall motion, LVEF of 84%, low risk stress test findings.  Consultations:  cardiology  Discharge Exam: Filed Vitals:   04/06/14 0530  BP: 110/62  Pulse: 69  Temp: 98 F (36.7 C)  Resp: 18   General: Alert and awake, oriented x3, not in any acute distress. HEENT: anicteric sclera, pupils reactive to light and accommodation, EOMI CVS: S1-S2 clear, no murmur rubs or gallops Chest: clear  to auscultation bilaterally, no wheezing, rales or rhonchi Abdomen: soft nontender, nondistended, normal bowel sounds, no organomegaly Extremities: no cyanosis, clubbing or edema noted bilaterally Neuro: left-sided spasticity including left upper extremity secondary to previous  stroke  Discharge Instructions You were cared for by a hospitalist during your hospital stay. If you have any questions about your discharge medications or the care you received while you were in the hospital after you are discharged, you can call the unit and asked to speak with the hospitalist on call if the hospitalist that took care of you is not available. Once you are discharged, your primary care physician will handle any further medical issues. Please note that NO REFILLS for any discharge medications will be authorized once you are discharged, as it is imperative that you return to your primary care physician (or establish a relationship with a primary care physician if you do not have one) for your aftercare needs so that they can reassess your need for medications and monitor your lab values.  Discharge Instructions    Diet - low sodium heart healthy    Complete by:  As directed      Increase activity slowly    Complete by:  As directed           Current Discharge Medication List    START taking these medications   Details  cefUROXime (CEFTIN) 500 MG tablet Take 1 tablet (500 mg total) by mouth 2 (two) times daily with a meal.    metoprolol tartrate (LOPRESSOR) 25 MG tablet Take 0.5 tablets (12.5 mg total) by mouth 2 (two) times daily.      CONTINUE these medications which have NOT CHANGED   Details  acetaminophen (TYLENOL) 650 MG CR tablet Take 650 mg by mouth every 4 (four) hours as needed for pain or fever.    amLODipine (NORVASC) 5 MG tablet Take 5 mg by mouth daily.    baclofen (LIORESAL) 10 MG tablet Take 10 mg by mouth 4 (four) times daily.    calcium carbonate (OS-CAL - DOSED IN MG OF ELEMENTAL CALCIUM) 1250 MG tablet Take 1 tablet by mouth 2 (two) times daily with a meal. 500mg  elemental    Cholecalciferol (VITAMIN D3) 2000 UNITS capsule Take 2,000 Units by mouth daily.    Dextromethorphan-Quinidine (NUEDEXTA) 20-10 MG CAPS Take 1 capsule by mouth daily.     dicyclomine (BENTYL) 20 MG tablet Take 10 mg by mouth 3 (three) times daily.     dipyridamole-aspirin (AGGRENOX) 200-25 MG per 12 hr capsule Take 1 capsule by mouth 2 (two) times daily.    Efinaconazole (JUBLIA) 10 % SOLN Apply 1 application topically daily as needed (toenail fungus).    furosemide (LASIX) 20 MG tablet Take 20 mg by mouth daily.    gabapentin (NEURONTIN) 100 MG capsule Take 100 mg by mouth 3 (three) times daily.    loratadine (CLARITIN) 10 MG tablet Take 10 mg by mouth daily as needed for allergies.    omeprazole (PRILOSEC) 20 MG capsule Take 20 mg by mouth daily.    Prenatal Vit-Fe Fumarate-FA (PREPLUS) 27-1 MG TABS Take 1 tablet by mouth 2 (two) times daily.    simvastatin (ZOCOR) 20 MG tablet Take 30 mg by mouth every evening. For hyperlipidemia /CVA    terbinafine (LAMISIL) 250 MG tablet Take 250 mg by mouth daily.    nitroGLYCERIN (NITROSTAT) 0.4 MG SL tablet Place 0.4 mg under the tongue every 5 (five) minutes as needed for chest pain.  STOP taking these medications     levofloxacin (LEVAQUIN) 750 MG tablet        Allergies  Allergen Reactions  . Penicillins Rash   Follow-up Information    Follow up with Blanchie Serve, MD In 1 week.   Specialty:  Internal Medicine   Contact information:   [place] [address] [phone]        The results of significant diagnostics from this hospitalization (including imaging, microbiology, ancillary and laboratory) are listed below for reference.    Significant Diagnostic Studies: Dg Chest 2 View  04/03/2014   CLINICAL DATA:  Pt experiencing CP today mostly on left side - was diagnosed with PNA 2 days ago in left lung, fever and SOB for several days - hx of stroke in 2007, htn - unable to lift left arm for lateral view  EXAM: CHEST  2 VIEW  COMPARISON:  04/07/2012  FINDINGS: Shallow lung inflation. Heart is enlarged. Large hiatal hernia. No focal consolidations or pleural effusions.  No pulmonary edema.  IMPRESSION: 1. Cardiomegaly. 2.  No focal acute pulmonary abnormality.   Electronically Signed   By: Shon Hale M.D.   On: 04/03/2014 08:31   Ct Angio Chest Pe W/cm &/or Wo Cm  04/04/2014   CLINICAL DATA:  SOB (shortness of breath) R06.02 (ICD-10-CM)Chest pain, unspecified chest pain type R07.9 (ICD-10-CM)  EXAM: CT ANGIOGRAPHY CHEST WITH CONTRAST  TECHNIQUE: Multidetector CT imaging of the chest was performed using the standard protocol during bolus administration of intravenous contrast. Multiplanar CT image reconstructions and MIPs were obtained to evaluate the vascular anatomy.  CONTRAST:  41mL OMNIPAQUE IOHEXOL 350 MG/ML SOLN  COMPARISON:  Chest radiograph of 1 day prior.  No prior CT.  FINDINGS: Lungs/Pleura: Mild motion degradation throughout. Hiatal hernia with adjacent left base collapse/consolidative change. Favored to represent atelectasis. Mild right base dependent atelectasis.  No pleural fluid.  Heart/Mediastinum: The quality of this examination for evaluation of pulmonary embolism is moderate. No pulmonary embolism to the large segmental level. Motion especially degrades evaluation of the posterior right upper lobe.  Aortic atherosclerosis. Ascending aorta tortuous without aneurysm or dissection. Mild to moderate cardiomegaly with coronary artery atherosclerosis. No pericardial effusion. No mediastinal or hilar adenopathy. Large hiatal hernia with approximately 1/2 of the stomach position within the chest. No obstruction.  Upper Abdomen: Cholelithiasis. Normal imaged portions of the spleen, stomach, liver, pancreas, adrenal glands.  Bones/Musculoskeletal:  No acute osseous abnormality.  Review of the MIP images confirms the above findings.  IMPRESSION: 1. Moderate quality exam, with motion degradation. No evidence of large pulmonary embolism. 2.  Atherosclerosis, including within the coronary arteries. 3. Large hiatal hernia with probable atelectasis within the adjacent  compressed lower lobe. 4. Cholelithiasis.   Electronically Signed   By: Abigail Miyamoto M.D.   On: 04/04/2014 17:56   Nm Myocar Multi W/spect W/wall Motion / Ef  04/05/2014   CLINICAL DATA:  Chest pain, unspecified  EXAM: MYOCARDIAL IMAGING WITH SPECT (REST AND PHARMACOLOGIC-STRESS)  GATED LEFT VENTRICULAR WALL MOTION STUDY  LEFT VENTRICULAR EJECTION FRACTION  TECHNIQUE: Standard myocardial SPECT imaging was performed after resting intravenous injection of 10 mCi Tc-10m sestamibi. Subsequently, intravenous infusion of Lexiscan was performed under the supervision of the Cardiology staff. At peak effect of the drug, 30 mCi Tc-87m sestamibi was injected intravenously and standard myocardial SPECT imaging was performed. Quantitative gated imaging was also performed to evaluate left ventricular wall motion, and estimate left ventricular ejection fraction.  COMPARISON:  None.  FINDINGS: Perfusion: No decreased activity in the left ventricle on stress imaging to suggest reversible ischemia or infarction.  Wall Motion: Normal left ventricular wall motion. No left ventricular dilation.  Left Ventricular Ejection Fraction: 84 %  End diastolic volume 45 ml  End systolic volume 7 ml  IMPRESSION: 1. No reversible ischemia or infarction.  2. Normal left ventricular wall motion.  3. Left ventricular ejection fraction 84%  4. Low-risk stress test findings*.  *2012 Appropriate Use Criteria for Coronary Revascularization Focused Update: J Am Coll Cardiol. 2979;89(2):119-417. http://content.airportbarriers.com.aspx?articleid=1201161   Electronically Signed   By: Daryll Brod M.D.   On: 04/05/2014 15:26    Microbiology: Recent Results (from the past 240 hour(s))  Urine culture     Status: None   Collection Time: 04/04/14  7:37 AM  Result Value Ref Range Status   Specimen Description URINE, RANDOM  Final   Special Requests NONE  Final   Culture  Setup Time   Final    04/04/2014 20:50 Performed at [facility]   Final    >=100,000 COLONIES/ML Performed at [facility]    Culture   Final    Multiple bacterial morphotypes present, none predominant. Suggest appropriate recollection if clinically indicated. Performed at [facility]    Report Status 04/06/2014 FINAL  Final     Labs: Basic Metabolic Panel:  Recent Labs Lab 04/03/14 0742 04/03/14 1334 04/05/14 0330 04/06/14 0358  NA 140  --  138 140  K 3.9  --  3.7 4.2  CL 103  --  103 105  CO2 20  --  22 22  GLUCOSE 133*  --  124* 98  BUN 18  --  15 17  CREATININE 0.92 0.92 0.74 0.77  CALCIUM 8.9  --  8.7 8.8   Liver Function Tests: No results for input(s): AST, ALT, ALKPHOS, BILITOT, PROT, ALBUMIN in the last 168 hours. No results for input(s): LIPASE, AMYLASE in the last 168 hours. No results for input(s): AMMONIA in the last 168 hours. CBC:  Recent Labs Lab 04/03/14 0742 04/03/14 1334 04/05/14 0330  WBC 12.6* 13.2* 7.4  HGB 14.0 14.3 13.2  HCT 41.8 42.4 40.9  MCV 87.8 87.4 91.5  PLT 139* 156 163   Cardiac Enzymes:  Recent Labs Lab 04/03/14 1450 04/03/14 2024 04/04/14 0339  TROPONINI <0.30 <0.30 <0.30   BNP: BNP (last 3 results)  Recent Labs  04/03/14 0742  PROBNP 205.3*   CBG: No results for input(s): GLUCAP in the last 168 hours.     Signed:  Keona Bilyeu A  [facility] 04/06/2014, 11:20 AM

## 2014-04-06 NOTE — Clinical Social Work Psychosocial (Signed)
     Clinical Social Work Department BRIEF PSYCHOSOCIAL ASSESSMENT 04/06/2014  Patient:  Gregory Kerr, Gregory Kerr     Account Number:  [id number]     Admit date:  04/03/2014  Clinical Social Worker:  Adair Laundry  Date/Time:  04/06/2014 11:17 AM  Referred by:  Physician  Date Referred:  04/06/2014 Referred for  SNF Placement   Other Referral:   Interview type:  Patient Other interview type:    PSYCHOSOCIAL DATA Living Status:  FACILITY Admitted from facility:  [facility] Level of care:  [place] Primary support name:  Gregory Kerr Gregory Kerr Primary support relationship to patient:  [place] Degree of support available:   Pt has good family support    CURRENT CONCERNS Current Concerns  Post-Acute Placement   Other Concerns:    SOCIAL WORK ASSESSMENT / PLAN CSW informed pt was admitted from facility. CSW visited pt room and pt confirmed he was admitted from [facility]. Pt has resided at facility for 5 years and expressed to Gregory Kerr he was ready to return. Pt informed CSW he has absolutely no complaints about the facility and that he appreciates the care they provide. Pt did express numerous questions about any changes in diet at dc. CSW informed pt that CSW was unsure but CSW would ask pt nurse to notify pt of changes when dc summary is available. CSW informed pt that CSW would notify facility of these changes and provide them with necessary paperwork. Pt thankful for CSW involvement. CSW offered to call family to notify. Pt asked CSW to call his sister Gregory Kerr. CSW called provided phone number and pt sister Gregory Kerr was unavailable. CSW spoke with pt brother-in-law Gregory Kerr and notified that pt would dc back to [facility] today. He agreed to notify pt sister as well. CSW spoke with facility admissions and notified that pt is ready to return today.   Assessment/plan status:  Psychosocial Support/Ongoing Assessment of Needs Other assessment/ plan:   Information/referral  to community resources:   None needed    PATIENTS/FAMILYS RESPONSE TO PLAN OF CARE: Pt seated in bed during assessment. Pt pleasant and cheerful. Pt agreeable to return to SNF      Gregory Kerr, Gregory Kerr

## 2014-04-10 ENCOUNTER — Non-Acute Institutional Stay (SKILLED_NURSING_FACILITY): Payer: PRIVATE HEALTH INSURANCE | Admitting: Internal Medicine

## 2014-04-10 ENCOUNTER — Encounter: Payer: Self-pay | Admitting: Internal Medicine

## 2014-04-10 DIAGNOSIS — N3000 Acute cystitis without hematuria: Secondary | ICD-10-CM

## 2014-04-10 DIAGNOSIS — I638 Other cerebral infarction: Secondary | ICD-10-CM

## 2014-04-10 DIAGNOSIS — K219 Gastro-esophageal reflux disease without esophagitis: Secondary | ICD-10-CM

## 2014-04-10 DIAGNOSIS — I10 Essential (primary) hypertension: Secondary | ICD-10-CM

## 2014-04-10 DIAGNOSIS — G8110 Spastic hemiplegia affecting unspecified side: Secondary | ICD-10-CM

## 2014-04-10 DIAGNOSIS — I6389 Other cerebral infarction: Secondary | ICD-10-CM

## 2014-04-10 DIAGNOSIS — K589 Irritable bowel syndrome without diarrhea: Secondary | ICD-10-CM

## 2014-04-22 ENCOUNTER — Ambulatory Visit (INDEPENDENT_AMBULATORY_CARE_PROVIDER_SITE_OTHER): Payer: PRIVATE HEALTH INSURANCE | Admitting: Neurology

## 2014-04-22 ENCOUNTER — Encounter: Payer: Self-pay | Admitting: Neurology

## 2014-04-22 DIAGNOSIS — I6389 Other cerebral infarction: Secondary | ICD-10-CM

## 2014-04-22 DIAGNOSIS — G8314 Monoplegia of lower limb affecting left nondominant side: Secondary | ICD-10-CM

## 2014-04-22 DIAGNOSIS — G8114 Spastic hemiplegia affecting left nondominant side: Secondary | ICD-10-CM | POA: Diagnosis not present

## 2014-04-22 DIAGNOSIS — G8110 Spastic hemiplegia affecting unspecified side: Secondary | ICD-10-CM

## 2014-04-22 MED ORDER — INCOBOTULINUMTOXINA 100 UNITS IM SOLR
300.0000 [IU] | Freq: Once | INTRAMUSCULAR | Status: AC
  Administered 2014-04-22: 300 [IU] via INTRAMUSCULAR

## 2014-04-22 NOTE — Progress Notes (Signed)
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   Gregory Kerr  has suffered  stroke in 2007, with residual spastice left spastic hemiparesis, at home,  he has limited functional status, was able to use hemiwalker transfer himself in and out of wheelchair, wearing left foot ankle brace, hew as able to ambulate some.  he was started on Botox injection since 2011.,  He reported good response with the injection, the injection was done to loosen up his left upper extremity, it started to wear off in 8-10 weeks.  He also has past medical history of hypertension, anemia,     He has forceful  left finger/wrist flexion, limited ROM of left elbow, but he deines signficiant pain. He also has left knee extension, left ankle plantar flexion   UPDATE July 1st 2015: Last injection was in Oct 2014.  His sister is concerned about the the potential benefit vs long term side effect of repeated botulinum toxin injections. And patient is concerned about needle pain that is associated with the injection.  He did indicate that previous injections have been very helpful in relaxing his left shoulder, left elbow, left wrist and fingers, he did not notice significant side effect, desires further treatment.  UPDATE Sept 2nd 2015: He noticed increased specificity of his left arm, now he is quite few months out from previous injection, especially tightness of his left elbow, left shoulder, he denies significant benefit from previous injection to his left lower extremity, also worry about the side effect of causing weakness in his left hand  UPDATE Dec 2nd 2015: Last injection was in Sept 2nd 2015, works well for him, the injection focused at his left arm now.  He does not want to have injection to his left leg, he still uses his left finger to grip,   Review of the system: Out of a complete 14  system review, the patient complains of only the following symptoms, and all other reviewed systems are negative.     Physical Exam  General: well nourished, well hydrated, no acute distress Head: normocephalic  Neck: supple, no masses, trachea midline Respiratory: clear to auscultation bilaterally. Cardiovascular: RRR Skin: bilateral upper chest and back small rashes. Trunk: slender   Neurologic Exam  Mental Status: in wheel chair, pleasant. mild slurred hoarse speeach Cranial Nerves: mild left lower face weakness. Motor: spastic left hemiparesis. Left arm stayed in left shoulder internal rotation, addcution, left forearm pronation, shoulder abduction 3, elbow flexion 3, left wrist forceful flexed position, maximum 170 degree,  left elbow fixed flexion, pronation. maximum extension 160 degree. Left leg extension at knee, left ankle fixed plantar inversion.  Sensory: intact to primary modalities Coordination: normal right finger to nose Gait and Station: deferred    Assessment and Plan:   73 yo with history of stroke, spastic left hemiparesis, his questions about potential long-term botulism toxin injection side effect was explained to him, including muscle weakness, atrophy.  He did indicate that previous injections have been very helpful in relaxing his left shoulder, left elbow, left wrist and fingers, he did not notice significant side effect, desires further treatment.  Under EMG guidance, 300 units of xeomin was injected into his spastic left upper extremity  Left biceps 25x2=50 Left brachialis 25 x3=75 Left pronator teres 25  Left pectoralis major 25 x2= 50  Left teres major 25x2=50 Left latissimus dorsi 25x2= 50

## 2014-04-26 NOTE — Progress Notes (Signed)
Patient ID: Gregory Kerr, male   DOB: Sep 18, 1940, 73 y.o.   MRN: [id number]     Facility: [facility]    PCP: Blanchie Serve, MD  Code Status: full code  Allergies  Allergen Reactions  . Penicillins Rash    Chief Complaint  Patient presents with  . Readmit To SNF     HPI:  73 y/o male pt is seen today for readmission visit. He was in the hospital from 04/03/14-04/06/14 with chest pain. ekg showed q waves in inferior leads. cxr was negative for infiltrates and troponin was normal. Cardiology was consulted. CTA was negative for PE. Lexiscan Myoview stress test showed no reversible ischemia or infarction. Aspirin and b blocker were continued and morphine helped with his chest pain. Pain was thought to be from ? Bronchitis as he was on antibiotics prior to hospital admission. He was also treated for uti. He is seen in his room today. He denies any concerns. No further chest pain. He has pmh of cva, HLD, HTN among others.  Review of Systems:  Constitutional: Negative for fever, chills,  malaise/fatigue and diaphoresis.  HENT: Negative for congestion, hearing loss, earache and sore throat.   Eyes: Negative for eye pain, blurred vision, double vision and discharge.  Respiratory: Negative for cough, sputum production, shortness of breath and wheezing.   Cardiovascular: Negative for chest pain, palpitations, orthopnea and leg swelling.  Gastrointestinal: Negative for heartburn, nausea, vomiting, abdominal pain, melena, rectal bleed, diarrhea and constipation. appetite is normal Genitourinary: Negative for dysuria, urgency, frequency, hematuria, nocturia and flank pain.  Musculoskeletal: Negative for back pain, falls, joint pain and myalgias. Has muscle spasticity Skin: Negative for itching, sores and rash.  Neurological: Negative for new weakness,dizziness, tingling, headaches.  Psychiatric/Behavioral: Negative for depression, insomnia and memory loss. The  patient is not nervous/anxious.     Past Medical History  Diagnosis Date  . Stroke   . GERD (gastroesophageal reflux disease)   . Hypertension   . Hernia   . Hyperlipidemia   . HTN (hypertension) 10/24/2012  . Osteoporosis, unspecified 10/24/2012  . 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 Other and unspecified hyperlipidemia 10/24/2012  . Hiatal hernia 10/24/2012  . Pneumonia    Past Surgical History  Procedure Laterality Date  . Appendectomy  05/27/2009    Ronnald Collum, MD  . Cataract extraction Bilateral Patient unsure. States several years ago.   Social History:   reports that he quit smoking about 8 years ago. His smoking use included Cigarettes. He smoked 0.50 packs per day. He has never used smokeless tobacco. He reports that he does not drink alcohol or use illicit drugs.  Family History  Problem Relation Age of Onset  . Heart attack Mother   . Heart attack Father   . Cancer Maternal Aunt     Stomach  . Cancer Paternal Aunt     Uterine,Colon    Medications: Patient's Medications  New Prescriptions   No medications on file  Previous Medications   ACETAMINOPHEN (TYLENOL) 650 MG CR TABLET    Take 650 mg by mouth every 4 (four) hours as needed for pain or fever.   AMLODIPINE (NORVASC) 5 MG TABLET    Take 5 mg by mouth daily.   BACLOFEN (LIORESAL) 10 MG TABLET    Take 10 mg by mouth 4 (four) times daily.   CALCIUM CARBONATE (OS-CAL - DOSED IN MG OF ELEMENTAL CALCIUM) 1250 MG TABLET    Take  1 tablet by mouth 2 (two) times daily with a meal. 500mg  elemental   CEFUROXIME (CEFTIN) 500 MG TABLET    Take 1 tablet (500 mg total) by mouth 2 (two) times daily with a meal.   CHOLECALCIFEROL (VITAMIN D3) 2000 UNITS CAPSULE    Take 2,000 Units by mouth daily.   DEXTROMETHORPHAN-QUINIDINE (NUEDEXTA) 20-10 MG CAPS    Take 1 capsule by mouth daily.   DICYCLOMINE (BENTYL) 20 MG TABLET    Take 10 mg by mouth 3 (three) times daily.    DIPYRIDAMOLE-ASPIRIN  (AGGRENOX) 200-25 MG PER 12 HR CAPSULE    Take 1 capsule by mouth 2 (two) times daily.   EFINACONAZOLE (JUBLIA) 10 % SOLN    Apply 1 application topically daily as needed (toenail fungus).   FUROSEMIDE (LASIX) 20 MG TABLET    Take 20 mg by mouth daily.   GABAPENTIN (NEURONTIN) 100 MG CAPSULE    Take 100 mg by mouth 3 (three) times daily.   LORATADINE (CLARITIN) 10 MG TABLET    Take 10 mg by mouth daily as needed for allergies.   METOPROLOL TARTRATE (LOPRESSOR) 25 MG TABLET    Take 0.5 tablets (12.5 mg total) by mouth 2 (two) times daily.   NITROGLYCERIN (NITROSTAT) 0.4 MG SL TABLET    Place 0.4 mg under the tongue every 5 (five) minutes as needed for chest pain.   OMEPRAZOLE (PRILOSEC) 20 MG CAPSULE    Take 20 mg by mouth daily.   PRENATAL VIT-FE FUMARATE-FA (PREPLUS) 27-1 MG TABS    Take 1 tablet by mouth 2 (two) times daily.   SIMVASTATIN (ZOCOR) 20 MG TABLET    Take 30 mg by mouth every evening. For hyperlipidemia /CVA   TERBINAFINE (LAMISIL) 250 MG TABLET    Take 250 mg by mouth daily.  Modified Medications   No medications on file  Discontinued Medications   No medications on file     Physical Exam: Filed Vitals:   04/10/14 1737  BP: 138/82  Pulse: 74  Temp: 97.9 F (36.6 C)  Resp: 18  SpO2: 97%    General- elderly male in no acute distress Head- atraumatic, normocephalic Eyes- PERRLA, EOMI, no pallor, no icterus, no discharge Neck- no cervical lymphadenopathy Throat- moist mucus membrane, normal oropharynx Cardiovascular- normal s1,s2, no murmurs/ rubs/ gallops, normal dorsalis pedis Respiratory- bilateral clear to auscultation, no wheeze, no rhonchi, no crackles, no use of accessory muscles Abdomen- bowel sounds present, soft, non tender, no guarding or rigidity, no CVA tenderness Musculoskeletal- left sided hemiplegia, normal strength on right side, no leg edema Neurological- no focal deficit Skin- warm and dry Psychiatry- alert and oriented to person, place and time,  normal mood and affect   Labs reviewed: Basic Metabolic Panel:  Recent Labs  04/03/14 0742 04/03/14 1334 04/05/14 0330 04/06/14 0358  NA 140  --  138 140  K 3.9  --  3.7 4.2  CL 103  --  103 105  CO2 20  --  22 22  GLUCOSE 133*  --  124* 98  BUN 18  --  15 17  CREATININE 0.92 0.92 0.74 0.77  CALCIUM 8.9  --  8.7 8.8   Liver Function Tests:  Recent Labs  08/03/13 02/03/14  AST 16 15  ALT 15 12   No results for input(s): LIPASE, AMYLASE in the last 8760 hours. No results for input(s): AMMONIA in the last 8760 hours. CBC:  Recent Labs  04/03/14 0742 04/03/14 1334 04/05/14 0330  WBC  12.6* 13.2* 7.4  HGB 14.0 14.3 13.2  HCT 41.8 42.4 40.9  MCV 87.8 87.4 91.5  PLT 139* 156 163   Cardiac Enzymes:  Recent Labs  04/03/14 1450 04/03/14 2024 04/04/14 0339  TROPONINI <0.30 <0.30 <0.30    Assessment/Plan  UTI Complete course of ceftin, monitor clinically  Essential hypertension Stable. Continue norvasc 5mg  daily, metoprolol 12.5 mg twice daily and lasix 20mg  daily and monitor  Spastic hemiplegia affecting nondominant side Ongoing. Continue baclofen 10mg  daily, gabapentin 100mg  three times daily, aggrenox 200/25mg  twice daily and monitor.   CVA BP controlled with current regimen. Continue lipid treatment with statin. Continue nudexta for PBA. Continue aspirin  IBS Stable. Continue bentyl 10mg  three times daily and monitor.   gerd Continue omeprazole  Family/ staff Communication: reviewed care plan with patient and nursing supervisor  Goals of care: long term care   Labs/tests ordered: none    Blanchie Serve, MD  [facility] [phone] (Monday-Friday 8 am - 5 pm) [phone] (afterhours)

## 2014-04-26 NOTE — Progress Notes (Signed)
This encounter was created in error - please disregard.

## 2014-05-04 LAB — HM DIABETES EYE EXAM

## 2014-05-21 ENCOUNTER — Non-Acute Institutional Stay (SKILLED_NURSING_FACILITY): Payer: PRIVATE HEALTH INSURANCE | Admitting: Internal Medicine

## 2014-05-21 DIAGNOSIS — K580 Irritable bowel syndrome with diarrhea: Secondary | ICD-10-CM

## 2014-05-21 DIAGNOSIS — I209 Angina pectoris, unspecified: Secondary | ICD-10-CM

## 2014-05-21 DIAGNOSIS — K219 Gastro-esophageal reflux disease without esophagitis: Secondary | ICD-10-CM

## 2014-06-18 ENCOUNTER — Non-Acute Institutional Stay (SKILLED_NURSING_FACILITY): Payer: Medicare Other | Admitting: Internal Medicine

## 2014-06-18 DIAGNOSIS — I10 Essential (primary) hypertension: Secondary | ICD-10-CM

## 2014-06-18 DIAGNOSIS — K219 Gastro-esophageal reflux disease without esophagitis: Secondary | ICD-10-CM

## 2014-06-18 DIAGNOSIS — M150 Primary generalized (osteo)arthritis: Secondary | ICD-10-CM

## 2014-06-18 DIAGNOSIS — G8114 Spastic hemiplegia affecting left nondominant side: Secondary | ICD-10-CM

## 2014-06-18 NOTE — Progress Notes (Signed)
Patient ID: Gregory Kerr, male   DOB: 1940/11/27, 74 y.o.   MRN: [id number]    Facility: [facility] - oputm [facility]  Cc- routine visit  Allergy- PCN  HPI:   74 y/o male pt is seen today for routine visit. He has been at his baseline. He denies any concerns. No concern from staff. He is up on wheelchair everyday. He is watching his daily soap at present. Continues restorative therapy.  He has pmh of cva, HLD, HTN among others.  Review of Systems:  Constitutional: Negative for fever, chills,  malaise/fatigue and diaphoresis.  HENT: Negative for congestion, hearing loss, earache and sore throat.   Respiratory: Negative for cough, sputum production, shortness of breath and wheezing.   Cardiovascular: Negative for chest pain, palpitations, orthopnea and leg swelling.  Gastrointestinal: Negative for heartburn, nausea, vomiting, abdominal pain Skin: Negative for itching, sores and rash.   Past Medical History  Diagnosis Date  . Stroke   . GERD (gastroesophageal reflux disease)   . Hypertension   . Hernia   . Hyperlipidemia   . HTN (hypertension) 10/24/2012  . Osteoporosis, unspecified 10/24/2012  . 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 Other and unspecified hyperlipidemia 10/24/2012  . Hiatal hernia 10/24/2012  . Pneumonia    Medication reviewed. See [facility]  Physical exam BP 128/70 mmHg  Pulse 70  Temp(Src) 98 F (36.7 C)  Resp 18  General- elderly male in no acute distress Head- atraumatic, normocephalic Cardiovascular- normal s1,s2, no murmurs/ rubs/ gallops, normal dorsalis pedis Respiratory- bilateral clear to auscultation, no wheeze, no rhonchi, no crackles, no use of accessory muscles Abdomen- bowel sounds present, soft, non tender Musculoskeletal- left sided hemiplegia, normal strength on right side, no leg edema Neurological- no focal deficit  Labs No recent labs  Assessment/plan  IBS Stable with bentyl for now,  monitor  gerd Stable with hx of hernia, continue his prilosec  Angina pectoris No further chest pain,on prn NTG. Continue his bp meds, metoprolol with statin and aggrenox

## 2014-06-18 NOTE — Progress Notes (Signed)
Patient ID: Gregory Kerr, male   DOB: [birth year], 74 y.o.   MRN: [id number]    Facility: [facility] - oputm care  Cc- routine visit  Allergy- PCN  HPI:   74 y/o male pt is seen today for routine visit. He denies any concerns. He has been at his baseline.  He has pmh of cva, HLD, HTN among others.  Review of Systems:  Constitutional: Negative for fever, chills,  malaise/fatigue and diaphoresis.  HENT: Negative for congestion, hearing loss, earache and sore throat.   Eyes: Negative for eye pain, blurred vision, double vision and discharge.  Respiratory: Negative for cough, sputum production, shortness of breath and wheezing.   Cardiovascular: Negative for chest pain, palpitations, orthopnea and leg swelling.  Gastrointestinal: Negative for heartburn, nausea, vomiting, abdominal pain, melena, rectal bleed, diarrhea and constipation. appetite is normal Genitourinary: Negative for dysuria, urgency, frequency, hematuria, nocturia and flank pain.  Musculoskeletal: Negative for falls, joint pain and myalgias. Has muscle spasticity Skin: Negative for itching, sores and rash.  Neurological: Negative for new weakness,dizziness, tingling, headaches.  Psychiatric/Behavioral: Negative for depression, insomnia and memory loss. The patient is not nervous/anxious.    Past Medical History  Diagnosis Date  . Stroke   . GERD (gastroesophageal reflux disease)   . Hypertension   . Hernia   . Hyperlipidemia   . HTN (hypertension) 10/24/2012  . Osteoporosis, unspecified 10/24/2012  . 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 Other and unspecified hyperlipidemia 10/24/2012  . Hiatal hernia 10/24/2012  . Pneumonia    Medication reviewed. See [facility]  Physical exam BP 112/69 mmHg  Pulse 60  Temp(Src) 97.6 F (36.4 C)  Resp 19  SpO2 97%  General- elderly male in no acute distress Head- atraumatic, normocephalic Eyes- PERRLA, EOMI, no pallor, no  icterus, no discharge Neck- no cervical lymphadenopathy Throat- moist mucus membrane, normal oropharynx Cardiovascular- normal s1,s2, no murmurs/ rubs/ gallops, normal dorsalis pedis Respiratory- bilateral clear to auscultation, no wheeze, no rhonchi, no crackles, no use of accessory muscles Abdomen- bowel sounds present, soft, non tender, no guarding or rigidity, no CVA tenderness Musculoskeletal- left sided hemiplegia, normal strength on right side, no leg edema Neurological- no focal deficit Skin- warm and dry Psychiatry- alert and oriented to person, place and time, normal mood and affect  Labs reviewed  Assessment/plan  Spastic hemiplegia Stable, continue baclofen nad aggrenox, gets botox injection with neurology  OA Continue prn oxycodone, stable  HTN bp reading stable, continue metoprolol 12.5 mg bid and norvasc with lasix  gerd Stable on prilosec, has hx of hiatal hernia

## 2014-07-21 LAB — TSH: TSH: 2.28 u[IU]/mL (ref <=5.90)

## 2014-07-21 LAB — PSA: PSA: 3.4

## 2014-07-29 ENCOUNTER — Ambulatory Visit: Payer: Medicare Other | Admitting: Neurology

## 2014-08-03 ENCOUNTER — Non-Acute Institutional Stay (SKILLED_NURSING_FACILITY): Payer: Medicare Other | Admitting: Internal Medicine

## 2014-08-03 DIAGNOSIS — I10 Essential (primary) hypertension: Secondary | ICD-10-CM

## 2014-08-03 DIAGNOSIS — R600 Localized edema: Secondary | ICD-10-CM | POA: Diagnosis not present

## 2014-08-03 DIAGNOSIS — E785 Hyperlipidemia, unspecified: Secondary | ICD-10-CM | POA: Diagnosis not present

## 2014-08-03 DIAGNOSIS — E559 Vitamin D deficiency, unspecified: Secondary | ICD-10-CM | POA: Diagnosis not present

## 2014-08-03 DIAGNOSIS — I69359 Hemiplegia and hemiparesis following cerebral infarction affecting unspecified side: Secondary | ICD-10-CM | POA: Diagnosis not present

## 2014-08-03 NOTE — Progress Notes (Signed)
Patient ID: Gregory Kerr, male   DOB: 1940/06/09, 74 y.o.   MRN: [id number]    Facility: [facility] - o[facility]m [facility]  Cc- routine visit  Allergy- PCN  Code status- full code  HPI  74 y/o male patient is seen today for routine visit. He denies any concerns. He has been at his baseline.   He has pmh of cva, HLD, HTN among others. No new concerns from the staff. His lipid panel is reviewed. His zocor has been recently increased to 20 mg daily. Continues to work with restorative  Review of Systems Constitutional: Negative for fever, chills,  malaise/fatigue and diaphoresis.   HENT: Negative for congestion, hearing loss, earache and sore throat.    Eyes: Negative for eye pain, blurred vision, double vision and discharge.   Respiratory: Negative for cough, sputum production, shortness of breath and wheezing.    Cardiovascular: Negative for chest pain, palpitations, orthopnea and leg swelling.   Gastrointestinal: Negative for heartburn, nausea, vomiting, abdominal pain, melena, rectal bleed, diarrhea and constipation. appetite is normal Genitourinary: Negative for dysuria, urgency, frequency, hematuria, nocturia and flank pain.   Musculoskeletal: Negative for falls, joint pain and myalgias. Has muscle spasticity. Gets routine botox injections from neurology Skin: Negative for itching, sores and rash.   Neurological: Negative for new weakness,dizziness, tingling, headaches.   Psychiatric/Behavioral: Negative for depression, insomnia and memory loss. The patient is not nervous/anxious.    Past medical history reviewed  Medications reviewed  Physical exam BP 130/72 mmHg  Pulse 76  Temp(Src) 98.2 F (36.8 C)  Resp 16  General- elderly male in no acute distress Head- atraumatic, normocephalic Eyes- PERRLA, EOMI, no pallor, no icterus, no discharge Neck- no cervical lymphadenopathy Throat- moist mucus membrane, normal oropharynx Cardiovascular- normal s1,s2, no  murmurs, normal dorsalis pedis Respiratory- bilateral clear to auscultation, no wheeze, no rhonchi, no crackles, no use of accessory muscles Abdomen- bowel sounds present, soft, non tender, no guarding or rigidity, no CVA tenderness Musculoskeletal- left sided hemiplegia, normal strength on right side, trace left leg edema Neurological- no focal deficit Skin- warm and dry Psychiatry- alert and oriented to person, place and time, normal mood and affect  Labs reviewed 07/22/14 wbc 5, hb 13.9, plt 154, na 140, k 3.9, bun 23, cr 0.84, glu 23, ca 8.6, alp 76, psa 3.4, t.chol 204, tg 146, ldl 140, hdl 35, tsh .284  Assessment/plan  HLD Continue zocor 20 mg daily, monitor lipid panel   Vitamin d def Continue vitamin d supplement  HTN bp reading stable, continue metoprolol 12.5 mg bid and norvasc 5 mg daily with lasix 20 mg daily  Leg edema Continue lasix and monitor bmp periodically  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

## 2014-08-31 ENCOUNTER — Non-Acute Institutional Stay (SKILLED_NURSING_FACILITY): Payer: Medicare Other | Admitting: Internal Medicine

## 2014-08-31 DIAGNOSIS — F482 Pseudobulbar affect: Secondary | ICD-10-CM

## 2014-08-31 DIAGNOSIS — I69359 Hemiplegia and hemiparesis following cerebral infarction affecting unspecified side: Secondary | ICD-10-CM | POA: Diagnosis not present

## 2014-08-31 DIAGNOSIS — J069 Acute upper respiratory infection, unspecified: Secondary | ICD-10-CM | POA: Diagnosis not present

## 2014-08-31 DIAGNOSIS — K219 Gastro-esophageal reflux disease without esophagitis: Secondary | ICD-10-CM

## 2014-08-31 DIAGNOSIS — K449 Diaphragmatic hernia without obstruction or gangrene: Secondary | ICD-10-CM

## 2014-08-31 NOTE — Progress Notes (Signed)
Patient ID: Gregory Kerr, male   DOB: 10/31/1940, 74 y.o.   MRN: [id number]    Facility: [facility] - oputm care  Chief complaint: medical management of chronic isssues  Allergy: PCN  Code status- full code  HPI  74 y/o male patient is seen today for routine visit. He denies any concerns for today but mentions that he was not feeling well over the weekend. He had runny nose, sore throat and was aching all over. He also had a headache. He took tylenol and zyrtec and today feels to back at his baseline. He also had loose stool 2-3 episodes yesterday but none today. He is due for his botox injection this Thursday at neurology clinic  He has pmh of cva, HLD, HTN among others. No new concerns from the staff.   Review of Systems Constitutional: Negative for malaise/fatigue and diaphoresis.   HENT: Negative for congestion, hearing loss, earache and sore throat.    Respiratory: Negative for cough, sputum production, shortness of breath and wheezing.    Cardiovascular: Negative for chest pain, palpitations, orthopnea and leg swelling.   Gastrointestinal: Negative for heartburn, nausea, vomiting, abdominal pain Genitourinary: Negative for dysuria Musculoskeletal: Negative for falls, joint pain and myalgias. Has muscle spasticity. Gets routine botox injections from neurology Skin: Negative for itching, rash.   Neurological: Negative for new weakness,dizziness, tingling, headaches.   Psychiatric/Behavioral: Negative for depression, insomnia and memory loss. The patient is not nervous/anxious.    Past medical history reviewed  Medications reviewed  Physical exam BP 100/76 mmHg  Pulse 76  Temp(Src) 98.2 F (36.8 C)  Resp 18  SpO2 98%  General- elderly male in no acute distress Head- atraumatic, normocephalic Eyes- PERRLA, EOMI, no pallor, no icterus, no discharge Neck- no cervical lymphadenopathy Nose- no sinus tenderness, no nasal drainage Throat- moist  mucus membrane, normal oropharynx Cardiovascular- normal s1,s2, no murmurs, normal dorsalis pedis Respiratory- bilateral clear to auscultation, no wheeze, no rhonchi, no crackles, no use of accessory muscles Abdomen- bowel sounds present, soft, non tender, no guarding or rigidity, no CVA tenderness Musculoskeletal- left sided hemiplegia, normal strength on right side, trace left leg edema Neurological- no focal deficit Skin- warm and dry Psychiatry- alert and oriented to person, place and time, normal mood and affect  Labs reviewed 07/22/14 wbc 5, hb 13.9, plt 154, na 140, k 3.9, bun 23, cr 0.84, glu 23, ca 8.6, alp 76, psa 3.4, t.chol 204, tg 146, ldl 140, hdl 35, tsh 0.284  Assessment/plan  Viral URI Likely had viral uri over the weekend, resolved, encouraged hydration  CVA Stable, continue aggrenox, norvasc and metoprolol with zocor  PBA Post old stroke, stable, continue nudexta  gerd Symptoms controlled, continue prilosec 20 mg daily

## 2014-09-02 ENCOUNTER — Encounter: Payer: Self-pay | Admitting: *Deleted

## 2014-09-02 LAB — LIPID PANEL
Cholesterol: 154 mg/dL (ref 0–200)
HDL: 27 mg/dL — AB (ref 35–70)
LDL Cholesterol: 104 mg/dL
TRIGLYCERIDES: 114 mg/dL (ref 40–160)

## 2014-09-03 ENCOUNTER — Ambulatory Visit: Payer: Medicare Other | Admitting: Neurology

## 2014-09-03 ENCOUNTER — Telehealth: Payer: Self-pay | Admitting: *Deleted

## 2014-09-03 NOTE — Telephone Encounter (Signed)
No show - his assisted living facility canceled his appt today.

## 2014-09-04 ENCOUNTER — Encounter: Payer: Self-pay | Admitting: Neurology

## 2014-09-22 ENCOUNTER — Emergency Department (HOSPITAL_COMMUNITY): Payer: Medicare Other

## 2014-09-22 ENCOUNTER — Inpatient Hospital Stay (HOSPITAL_COMMUNITY)
Admission: EM | Admit: 2014-09-22 | Discharge: 2014-09-28 | DRG: 417 | Disposition: A | Discharge status: Skilled Nursing Facility | Payer: Medicare Other | Attending: Internal Medicine | Admitting: Internal Medicine

## 2014-09-22 ENCOUNTER — Encounter (HOSPITAL_COMMUNITY): Payer: Self-pay | Admitting: Emergency Medicine

## 2014-09-22 DIAGNOSIS — K449 Diaphragmatic hernia without obstruction or gangrene: Secondary | ICD-10-CM | POA: Diagnosis present

## 2014-09-22 DIAGNOSIS — I10 Essential (primary) hypertension: Secondary | ICD-10-CM | POA: Diagnosis present

## 2014-09-22 DIAGNOSIS — Z880 Allergy status to penicillin: Secondary | ICD-10-CM | POA: Diagnosis not present

## 2014-09-22 DIAGNOSIS — K8020 Calculus of gallbladder without cholecystitis without obstruction: Secondary | ICD-10-CM

## 2014-09-22 DIAGNOSIS — M810 Age-related osteoporosis without current pathological fracture: Secondary | ICD-10-CM | POA: Diagnosis present

## 2014-09-22 DIAGNOSIS — Z79899 Other long term (current) drug therapy: Secondary | ICD-10-CM | POA: Diagnosis not present

## 2014-09-22 DIAGNOSIS — K859 Acute pancreatitis, unspecified: Secondary | ICD-10-CM

## 2014-09-22 DIAGNOSIS — I5032 Chronic diastolic (congestive) heart failure: Secondary | ICD-10-CM | POA: Insufficient documentation

## 2014-09-22 DIAGNOSIS — I5033 Acute on chronic diastolic (congestive) heart failure: Secondary | ICD-10-CM | POA: Diagnosis present

## 2014-09-22 DIAGNOSIS — F482 Pseudobulbar affect: Secondary | ICD-10-CM | POA: Diagnosis present

## 2014-09-22 DIAGNOSIS — Z9841 Cataract extraction status, right eye: Secondary | ICD-10-CM | POA: Diagnosis not present

## 2014-09-22 DIAGNOSIS — E785 Hyperlipidemia, unspecified: Secondary | ICD-10-CM | POA: Diagnosis present

## 2014-09-22 DIAGNOSIS — E860 Dehydration: Secondary | ICD-10-CM | POA: Diagnosis present

## 2014-09-22 DIAGNOSIS — Z09 Encounter for follow-up examination after completed treatment for conditions other than malignant neoplasm: Secondary | ICD-10-CM

## 2014-09-22 DIAGNOSIS — K851 Biliary acute pancreatitis without necrosis or infection: Secondary | ICD-10-CM | POA: Diagnosis present

## 2014-09-22 DIAGNOSIS — R109 Unspecified abdominal pain: Secondary | ICD-10-CM

## 2014-09-22 DIAGNOSIS — R1010 Upper abdominal pain, unspecified: Secondary | ICD-10-CM

## 2014-09-22 DIAGNOSIS — K819 Cholecystitis, unspecified: Secondary | ICD-10-CM

## 2014-09-22 DIAGNOSIS — I69354 Hemiplegia and hemiparesis following cerebral infarction affecting left non-dominant side: Secondary | ICD-10-CM | POA: Diagnosis not present

## 2014-09-22 DIAGNOSIS — E876 Hypokalemia: Secondary | ICD-10-CM | POA: Diagnosis not present

## 2014-09-22 DIAGNOSIS — K8050 Calculus of bile duct without cholangitis or cholecystitis without obstruction: Secondary | ICD-10-CM

## 2014-09-22 DIAGNOSIS — K8064 Calculus of gallbladder and bile duct with chronic cholecystitis without obstruction: Secondary | ICD-10-CM | POA: Diagnosis present

## 2014-09-22 DIAGNOSIS — K830 Cholangitis: Secondary | ICD-10-CM | POA: Diagnosis not present

## 2014-09-22 DIAGNOSIS — K219 Gastro-esophageal reflux disease without esophagitis: Secondary | ICD-10-CM | POA: Diagnosis present

## 2014-09-22 DIAGNOSIS — K8309 Other cholangitis: Secondary | ICD-10-CM | POA: Diagnosis present

## 2014-09-22 DIAGNOSIS — Z87891 Personal history of nicotine dependence: Secondary | ICD-10-CM | POA: Diagnosis not present

## 2014-09-22 DIAGNOSIS — Z9842 Cataract extraction status, left eye: Secondary | ICD-10-CM

## 2014-09-22 DIAGNOSIS — I69359 Hemiplegia and hemiparesis following cerebral infarction affecting unspecified side: Secondary | ICD-10-CM

## 2014-09-22 LAB — URINE MICROSCOPIC-ADD ON

## 2014-09-22 LAB — CBC WITH DIFFERENTIAL/PLATELET
BASOS ABS: 0 10*3/uL (ref 0.0–0.1)
Basophils Relative: 0 % (ref 0–1)
Eosinophils Absolute: 0.1 10*3/uL (ref 0.0–0.7)
Eosinophils Relative: 0 % (ref 0–5)
HCT: 42.3 % (ref 39.0–52.0)
HEMOGLOBIN: 14.3 g/dL (ref 13.0–17.0)
LYMPHS ABS: 0.6 10*3/uL — AB (ref 0.7–4.0)
LYMPHS PCT: 5 % — AB (ref 12–46)
MCH: 29.4 pg (ref 26.0–34.0)
MCHC: 33.8 g/dL (ref 30.0–36.0)
MCV: 87 fL (ref 78.0–100.0)
MONOS PCT: 7 % (ref 3–12)
Monocytes Absolute: 0.8 10*3/uL (ref 0.1–1.0)
NEUTROS ABS: 10.7 10*3/uL — AB (ref 1.7–7.7)
NEUTROS PCT: 88 % — AB (ref 43–77)
PLATELETS: 216 10*3/uL (ref 150–400)
RBC: 4.86 MIL/uL (ref 4.22–5.81)
RDW: 15.1 % (ref 11.5–15.5)
WBC: 12.1 10*3/uL — AB (ref 4.0–10.5)

## 2014-09-22 LAB — COMPREHENSIVE METABOLIC PANEL
ALBUMIN: 3.2 g/dL — AB (ref 3.5–5.0)
ALK PHOS: 475 U/L — AB (ref 38–126)
ALT: 142 U/L — ABNORMAL HIGH (ref 17–63)
ALT: 129 U/L — ABNORMAL HIGH (ref 17–63)
AST: 103 U/L — ABNORMAL HIGH (ref 15–41)
AST: 94 U/L — AB (ref 15–41)
Albumin: 2.8 g/dL — ABNORMAL LOW (ref 3.5–5.0)
Alkaline Phosphatase: 420 U/L — ABNORMAL HIGH (ref 38–126)
Anion gap: 13 (ref 5–15)
Anion gap: 10 (ref 5–15)
BILIRUBIN TOTAL: 3.3 mg/dL — AB (ref 0.3–1.2)
BILIRUBIN TOTAL: 4 mg/dL — AB (ref 0.3–1.2)
BUN: 16 mg/dL (ref 6–20)
BUN: 14 mg/dL (ref 6–20)
CHLORIDE: 106 mmol/L (ref 101–111)
CHLORIDE: 105 mmol/L (ref 101–111)
CO2: 20 mmol/L — ABNORMAL LOW (ref 22–32)
CO2: 21 mmol/L — AB (ref 22–32)
CREATININE: 0.8 mg/dL (ref 0.61–1.24)
Calcium: 8.9 mg/dL (ref 8.9–10.3)
Calcium: 8.3 mg/dL — ABNORMAL LOW (ref 8.9–10.3)
Creatinine, Ser: 0.86 mg/dL (ref 0.61–1.24)
GFR calc Af Amer: >60 mL/min (ref >60)
GFR calc non Af Amer: >60 mL/min (ref >60)
GFR calc non Af Amer: >60 mL/min (ref >60)
Glucose, Bld: 118 mg/dL — ABNORMAL HIGH (ref 70–99)
Glucose, Bld: 105 mg/dL — ABNORMAL HIGH (ref 70–99)
POTASSIUM: 3.7 mmol/L (ref 3.5–5.1)
Potassium: 3.7 mmol/L (ref 3.5–5.1)
SODIUM: 136 mmol/L (ref 135–145)
Sodium: 139 mmol/L (ref 135–145)
TOTAL PROTEIN: 7.4 g/dL (ref 6.5–8.1)
Total Protein: 6.8 g/dL (ref 6.5–8.1)

## 2014-09-22 LAB — URINALYSIS, ROUTINE W REFLEX MICROSCOPIC
Glucose, UA: NEGATIVE mg/dL
HGB URINE DIPSTICK: NEGATIVE
Ketones, ur: 15 mg/dL — AB
Nitrite: NEGATIVE
PROTEIN: NEGATIVE mg/dL
Specific Gravity, Urine: 1.025 (ref 1.005–1.030)
UROBILINOGEN UA: 1 mg/dL (ref 0.0–1.0)
pH: 6 (ref 5.0–8.0)

## 2014-09-22 LAB — I-STAT CG4 LACTIC ACID, ED
LACTIC ACID, VENOUS: 1.17 mmol/L (ref 0.5–2.0)
LACTIC ACID, VENOUS: 1.19 mmol/L (ref 0.5–2.0)

## 2014-09-22 LAB — BRAIN NATRIURETIC PEPTIDE: B Natriuretic Peptide: 80.2 pg/mL (ref 0.0–100.0)

## 2014-09-22 LAB — LIPASE, BLOOD: Lipase: 208 U/L — ABNORMAL HIGH (ref 22–51)

## 2014-09-22 LAB — LACTIC ACID, PLASMA
Lactic Acid, Venous: 1.3 mmol/L (ref 0.5–2.0)
Lactic Acid, Venous: 1.2 mmol/L (ref 0.5–2.0)

## 2014-09-22 LAB — TROPONIN I

## 2014-09-22 MED ORDER — ASPIRIN-DIPYRIDAMOLE ER 25-200 MG PO CP12
1.0000 | ORAL_CAPSULE | Freq: Two times a day (BID) | ORAL | Status: DC
  Administered 2014-09-22 – 2014-09-23 (×2): 1 via ORAL
  Filled 2014-09-22 (×3): qty 1

## 2014-09-22 MED ORDER — IOHEXOL 300 MG/ML  SOLN
25.0000 mL | Freq: Once | INTRAMUSCULAR | Status: AC | PRN
  Administered 2014-09-22: 25 mL via ORAL

## 2014-09-22 MED ORDER — DEXTROSE 5 % IV SOLN
1.0000 g | Freq: Three times a day (TID) | INTRAVENOUS | Status: DC
  Administered 2014-09-22 – 2014-09-23 (×4): 1 g via INTRAVENOUS
  Filled 2014-09-22 (×5): qty 1

## 2014-09-22 MED ORDER — CIPROFLOXACIN IN D5W 400 MG/200ML IV SOLN
400.0000 mg | Freq: Once | INTRAVENOUS | Status: AC
  Administered 2014-09-22: 400 mg via INTRAVENOUS
  Filled 2014-09-22: qty 200

## 2014-09-22 MED ORDER — SODIUM CHLORIDE 0.9 % IV SOLN
INTRAVENOUS | Status: AC
  Administered 2014-09-22: 10:00:00 via INTRAVENOUS

## 2014-09-22 MED ORDER — METRONIDAZOLE IN NACL 5-0.79 MG/ML-% IV SOLN
500.0000 mg | Freq: Once | INTRAVENOUS | Status: AC
  Administered 2014-09-22: 500 mg via INTRAVENOUS
  Filled 2014-09-22: qty 100

## 2014-09-22 MED ORDER — METRONIDAZOLE IN NACL 5-0.79 MG/ML-% IV SOLN
500.0000 mg | Freq: Three times a day (TID) | INTRAVENOUS | Status: DC
  Administered 2014-09-22 – 2014-09-23 (×4): 500 mg via INTRAVENOUS
  Filled 2014-09-22 (×5): qty 100

## 2014-09-22 MED ORDER — FUROSEMIDE 10 MG/ML IJ SOLN
20.0000 mg | Freq: Once | INTRAMUSCULAR | Status: AC
  Administered 2014-09-22: 20 mg via INTRAVENOUS
  Filled 2014-09-22: qty 2

## 2014-09-22 MED ORDER — DEXTROMETHORPHAN-QUINIDINE 20-10 MG PO CAPS: 1.0000 | ORAL_CAPSULE | Freq: Every day | ORAL | Status: DC

## 2014-09-22 MED ORDER — DEXTROSE-NACL 5-0.9 % IV SOLN
INTRAVENOUS | Status: DC
  Administered 2014-09-22: 16:00:00 via INTRAVENOUS

## 2014-09-22 MED ORDER — MORPHINE SULFATE 4 MG/ML IJ SOLN
4.0000 mg | Freq: Once | INTRAMUSCULAR | Status: AC
  Administered 2014-09-22: 4 mg via INTRAVENOUS
  Filled 2014-09-22: qty 1

## 2014-09-22 MED ORDER — ONDANSETRON HCL 4 MG PO TABS: 4.0000 mg | ORAL_TABLET | Freq: Four times a day (QID) | ORAL | Status: DC | PRN

## 2014-09-22 MED ORDER — MORPHINE SULFATE 2 MG/ML IJ SOLN
1.0000 mg | INTRAMUSCULAR | Status: DC | PRN
  Administered 2014-09-24: 1 mg via INTRAVENOUS
  Filled 2014-09-22: qty 1

## 2014-09-22 MED ORDER — IOHEXOL 300 MG/ML  SOLN
100.0000 mL | Freq: Once | INTRAMUSCULAR | Status: AC | PRN
  Administered 2014-09-22: 80 mL via INTRAVENOUS

## 2014-09-22 MED ORDER — ONDANSETRON HCL 4 MG/2ML IJ SOLN: 4.0000 mg | Freq: Four times a day (QID) | INTRAMUSCULAR | Status: DC | PRN

## 2014-09-22 NOTE — Progress Notes (Signed)
Pt. Arrived to the unit via stretcher. Pt. Is alert and oriented with no signs of distress noted. Pt. Vitals appear stable with no skin issues noted and some left sided weakness. Educated pt. On use of staff numbers, room telephone and call bell. Call light within reach. Orders released. No further needs noted at this time.

## 2014-09-22 NOTE — Progress Notes (Signed)
Attempted 5 times to call [facility] [employer] [facility] to retreive medication [employer]) for pt. Per MD orders. Unable to retrieve medication after being put on hold several times. Reported off to night nurse about pt. Needing medication. No further needs noted at this time.

## 2014-09-22 NOTE — ED Notes (Signed)
Redness and itching improving

## 2014-09-22 NOTE — ED Notes (Signed)
Ct called to inform that they are awaiting lab results for renal function. Informed them that main lab has already been contacted, awaiting results. They acknowledge.

## 2014-09-22 NOTE — ED Notes (Signed)
Portable x-ray at the bedside.  

## 2014-09-22 NOTE — ED Provider Notes (Signed)
CSN: [id number]     Arrival date & time 09/22/14  0505 History   First MD Initiated Contact with Patient 09/22/14 [phone]     Chief Complaint  Patient presents with  . Shortness of Breath     (Consider location/radiation/quality/duration/timing/severity/associated sxs/prior Treatment) HPI Patient presents with left upper quadrant abdominal pain starting at 11 PM last night. This radiates to through to his back. He states is causing him to be short of breath because it is painful to take a deep breath. He denies any nausea or vomiting. States he's had similar pain in the past. He took NSAIDs and Maalox at home with little relief. Patient with known hiatal hernia and recent kidney infection. Past Medical History  Diagnosis Date  . GERD (gastroesophageal reflux disease)   . Hypertension   . Hernia     "large one; on my left stomach" (09/22/2014)  . Hyperlipidemia   . HTN (hypertension) 10/24/2012  . Osteoporosis, unspecified 10/24/2012  . 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 Hiatal hernia 10/24/2012  . Pneumonia     "once or twice" (09/22/2014)  . Stroke 2007    "left side doesn't work now" (09/22/2014)   Past Surgical History  Procedure Laterality Date  . Appendectomy  05/27/2009    Ronnald Collum, MD  . Cataract extraction w/ intraocular lens  implant, bilateral Bilateral ?2014   Family History  Problem Relation Age of Onset  . Heart attack Mother   . Heart attack Father   . Cancer Maternal Aunt     Stomach  . Cancer Paternal Aunt     Uterine,Colon   History  Substance Use Topics  . Smoking status: Former Smoker -- 0.50 packs/day    Types: Cigarettes    Quit date: 12/20/2005  . Smokeless tobacco: Never Used  . Alcohol Use: Yes     Comment: 09/22/2014 "stopped in 2007; drank occasionally when I did drink"    Review of Systems  Constitutional: Negative for fever and chills.  Respiratory: Positive for shortness of breath. Negative for cough.    Cardiovascular: Negative for chest pain.  Gastrointestinal: Positive for abdominal pain and abdominal distention. Negative for nausea, vomiting, diarrhea and constipation.  Genitourinary: Positive for flank pain. Negative for frequency and hematuria.  Musculoskeletal: Positive for back pain. Negative for neck pain and neck stiffness.  Skin: Negative for rash and wound.  Neurological: Negative for dizziness, weakness, light-headedness, numbness and headaches.  All other systems reviewed and are negative.     Allergies  Penicillins  Home Medications   Prior to Admission medications   Medication Sig Start Date End Date Taking? Authorizing Provider  acetaminophen (TYLENOL) 650 MG CR tablet Take 650 mg by mouth every 4 (four) hours as needed for pain or fever.   Yes Historical Provider, MD  amLODipine (NORVASC) 5 MG tablet Take 5 mg by mouth daily.   Yes Historical Provider, MD  baclofen (LIORESAL) 10 MG tablet Take 10 mg by mouth 4 (four) times daily.   Yes Historical Provider, MD  calcium carbonate (OS-CAL - DOSED IN MG OF ELEMENTAL CALCIUM) 1250 MG tablet Take 1 tablet by mouth 2 (two) times daily with a meal. 500mg  elemental   Yes Historical Provider, MD  Cholecalciferol (VITAMIN D3) 2000 UNITS capsule Take 2,000 Units by mouth daily.   Yes Historical Provider, MD  Dextromethorphan-Quinidine (NUEDEXTA) 20-10 MG CAPS Take 1 capsule by mouth daily.   Yes Historical Provider, MD  dicyclomine (BENTYL) 20 MG tablet  Take 10 mg by mouth 3 (three) times daily.    Yes Historical Provider, MD  dipyridamole-aspirin (AGGRENOX) 200-25 MG per 12 hr capsule Take 1 capsule by mouth 2 (two) times daily.   Yes Historical Provider, MD  furosemide (LASIX) 20 MG tablet Take 20 mg by mouth daily.   Yes Historical Provider, MD  gabapentin (NEURONTIN) 100 MG capsule Take 100 mg by mouth 3 (three) times daily.   Yes Historical Provider, MD  loratadine (CLARITIN) 10 MG tablet Take 10 mg by mouth daily as needed  for allergies.   Yes Historical Provider, MD  metoprolol tartrate (LOPRESSOR) 25 MG tablet Take 0.5 tablets (12.5 mg total) by mouth 2 (two) times daily. 04/06/14  Yes Verlee Monte, MD  nitroGLYCERIN (NITROSTAT) 0.4 MG SL tablet Place 0.4 mg under the tongue every 5 (five) minutes as needed for chest pain.   Yes Historical Provider, MD  omeprazole (PRILOSEC) 20 MG capsule Take 20 mg by mouth daily.   Yes Historical Provider, MD  Prenatal Vit-Fe Fumarate-FA (PREPLUS) 27-1 MG TABS Take 1 tablet by mouth 2 (two) times daily.   Yes Historical Provider, MD  promethazine (PHENERGAN) 25 MG tablet Take 25 mg by mouth every 6 (six) hours as needed for nausea or vomiting.   Yes Historical Provider, MD  simvastatin (ZOCOR) 20 MG tablet Take 30 mg by mouth every evening. For hyperlipidemia /CVA   Yes Historical Provider, MD   BP 112/96 mmHg  Pulse 95  Temp(Src) 98.4 F (36.9 C) (Oral)  Resp 16  Ht 5\' 9"  (1.753 m)  Wt 160 lb 8 oz (72.802 kg)  BMI 23.69 kg/m2  SpO2 94% Physical Exam  Constitutional: He is oriented to person, place, and time. He appears well-developed and well-nourished. No distress.  HENT:  Head: Normocephalic and atraumatic.  Mouth/Throat: Oropharynx is clear and moist.  Eyes: EOM are normal. Pupils are equal, round, and reactive to light.  Neck: Normal range of motion. Neck supple.  Cardiovascular: Normal rate and regular rhythm.   Pulmonary/Chest: Effort normal and breath sounds normal. No respiratory distress. He has no wheezes. He has no rales.  Increased respiratory rate  Abdominal: Soft. Bowel sounds are normal. He exhibits distension. He exhibits no mass. There is tenderness (tenderness to palpation diffusely especially in the upper left abdomen.). There is no rebound and no guarding.  Musculoskeletal: Normal range of motion. He exhibits no edema or tenderness.  No definite CVA tenderness to palpation.  Neurological: He is alert and oriented to person, place, and time.   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  Skin: Skin is warm and dry. No rash noted. No erythema.  Psychiatric: He has a normal mood and affect. His behavior is normal.  Nursing note and vitals reviewed.   ED Course  Procedures (including critical care time) Labs Review Labs Reviewed  CBC WITH DIFFERENTIAL/PLATELET - Abnormal; Notable for the following:    WBC 12.1 (*)    Neutrophils Relative % 88 (*)    Neutro Abs 10.7 (*)    Lymphocytes Relative 5 (*)    Lymphs Abs 0.6 (*)    All other components within normal limits  COMPREHENSIVE METABOLIC PANEL - Abnormal; Notable for the following:    CO2 20 (*)    Glucose, Bld 118 (*)    Albumin 3.2 (*)    AST 103 (*)    ALT 142 (*)    Alkaline Phosphatase 475 (*)    Total Bilirubin  3.3 (*)    All other components within normal limits  LIPASE, BLOOD - Abnormal; Notable for the following:    Lipase 208 (*)    All other components within normal limits  URINALYSIS, ROUTINE W REFLEX MICROSCOPIC - Abnormal; Notable for the following:    Color, Urine AMBER (*)    Bilirubin Urine MODERATE (*)    Ketones, ur 15 (*)    Leukocytes, UA TRACE (*)    All other components within normal limits  URINE MICROSCOPIC-ADD ON - Abnormal; Notable for the following:    Bacteria, UA FEW (*)    Crystals CA OXALATE CRYSTALS (*)    All other components within normal limits  COMPREHENSIVE METABOLIC PANEL - Abnormal; Notable for the following:    CO2 21 (*)    Glucose, Bld 105 (*)    Calcium 8.3 (*)    Albumin 2.8 (*)    AST 94 (*)    ALT 129 (*)    Alkaline Phosphatase 420 (*)    Total Bilirubin 4.0 (*)    All other components within normal limits  TROPONIN I  BRAIN NATRIURETIC PEPTIDE  LACTIC ACID, PLASMA  LACTIC ACID, PLASMA  COMPREHENSIVE METABOLIC PANEL  CBC  LIPASE, BLOOD  I-STAT CG4 LACTIC ACID, ED  I-STAT CG4 LACTIC ACID, ED    Imaging Review US Abdomen Complete  09/22/2014   CLINICAL DATA:   Abdominal pain.  Patient has not been NPO.  EXAM: COMPLETE ABDOMINAL ULTRASOUND  COMPARISON:  CT 04/11/2012  FINDINGS: Gallbladder: Layering sludge with some small echogenic subcentimeter calculi. Borderline gallbladder wall thickening possibly secondary date incomplete distention. No pericholecystic fluid. Sonographer reports no sonographic Murphy's sign.  Common bile duct:  Normal in caliber, 13mm diameter.  Liver: Homogeneous in echotexture without focal lesion or intrahepatic bile duct dilatation.  IVC:  Negative  Pancreas: Visualized segments unremarkable, portions obscured by overlying bowel gas.  Spleen:  No focal lesion, craniocaudal 7.8cm in length.  Right Kidney:  No mass or hydronephrosis, 11.1cm in length.  Left Kidney:  No lesion or hydronephrosis, 10.6cm in length.  Abdominal aorta: Visualized segments unremarkable, portions obscured by overlying bowel gas.  IMPRESSION: 1. Cholelithiasis without other ultrasound evidence of cholecystitis or biliary obstruction.   Electronically Signed   By: Lucrezia Europe M.D.   On: 09/22/2014 09:09   Ct Abdomen Pelvis W Contrast  09/22/2014   CLINICAL DATA:  Abdominal pain, left side abdominal pain, history of hiatal hernia  EXAM: CT ABDOMEN AND PELVIS WITH CONTRAST  TECHNIQUE: Multidetector CT imaging of the abdomen and pelvis was performed using the standard protocol following bolus administration of intravenous contrast.  CONTRAST:  33mL OMNIPAQUE IOHEXOL 300 MG/ML  SOLN  COMPARISON:  04/11/2012  FINDINGS: There is a large hiatal hernia measuring 10.5 x 8 cm with at least 2/3 rd of proximal stomach in left lower chest retrocardiac position. There is no evidence of acute gastric volvulus. No gastric outlet obstruction.  Mild hepatic fatty infiltration. No focal hepatic mass. Small gallstones are noted in gallbladder neck region the largest measures 6.5 mm. Borderline thickening of gallbladder wall up to 2.9 mm without evidence of pericholecystic fluid. Again noted mild  pancreatic atrophy with fatty replacement. The spleen and adrenal glands are unremarkable. Again noted lobulated renal contour. No hydronephrosis or hydroureter. Cystic lesion along the uncinate process of the pancreas again noted measures 1.7 cm stable in size in appearance from prior exam. In a small portal caval lymph node measures 1.2 by 0.9 cm  stable in size in appearance from prior exam.  Atherosclerotic calcifications of abdominal aorta and iliac arteries. No aortic aneurysm.  There is a cyst in midpole of the right kidney anterolateral aspect measures 1.2 cm. Delayed renal images shows bilateral renal symmetrical excretion. A cyst in upper pole of the right kidney posterior aspect measures 1 cm.  Bilateral visualized proximal ureter is unremarkable.  No small bowel obstruction. No ascites or free air. No adenopathy. Oral contrast material is noted in distal small bowel terminal ileum and right colon. There is no pericecal inflammation. The terminal ileum is unremarkable. The patient is status post appendectomy. There is some stool in distal left colon and sigmoid colon. No evidence of colonic obstruction.  There is a right inguinal scrotal canal hernia containing fat and a loop of terminal small bowel measures 4.4 cm without evidence of small bowel obstruction. There is a left inguinal scrotal canal hernia containing fat measures 3.5 cm without evidence of acute complication. Prostate gland calcifications are noted. Mild enlarged prostate gland measures 4.9 by 3.5 cm. The urinary bladder is unremarkable.  IMPRESSION: 1. Again noted large hiatal hernia with at least 2/3 of proximal stomach in left lower chest retrocardiac position. There is no evidence of gastric volvulus or gastric outlet obstruction. 2. No small bowel or colonic obstruction. 3. Again noted gallstones within gallbladder. Borderline thickening of gallbladder wall up to 2.9 mm. No evidence of pericholecystic fluid. 4. Again noted atrophic  pancreas with partial fatty replacement. Stable cystic lesion along the uncinate process of the pancreas. 5. No small bowel obstruction. Again noted right inguinal scrotal hernia containing small bowel without evidence of acute complication or small bowel obstruction. Again noted a left inguinal scrotal canal hernia containing fat without evidence of acute complication. 6. Again noted mild enlarged prostate gland. 7. Mild hepatic fatty infiltration. 8. No hydronephrosis or hydroureter.  Right renal cysts are noted   Electronically Signed   By: Lahoma Crocker M.D.   On: 09/22/2014 09:28   Dg Chest Port 1 View  09/22/2014   CLINICAL DATA:  Dyspnea  EXAM: PORTABLE CHEST - 1 VIEW  COMPARISON:  04/03/2014  FINDINGS: There is a large hiatal hernia. There is interstitial fluid or thickening a there is mild vascular fullness. This likely represents congestive heart failure. No airspace consolidation is evident. There is no large effusion.  IMPRESSION: Congestive heart failure   Electronically Signed   By: Andreas Newport M.D.   On: 09/22/2014 06:13     EKG Interpretation None      MDM   Final diagnoses:  Abdominal pain  Upper abdominal pain  Acute pancreatitis, unspecified pancreatitis type    Signed out to oncoming emergency physician pending ultrasound of the abdomen. Will likely require admission.    Julianne Rice, MD 09/23/14 [phone]

## 2014-09-22 NOTE — ED Notes (Addendum)
Per EMS, patient has hx of hiatal hernia, and is experiencing pain with it. When he has pain with his hernia, he experiences shortness of breath, his main concern today. He reports pain more on the left side of his stomach, and back, that extends to his groin. No meds given en route by EMS.

## 2014-09-22 NOTE — ED Notes (Signed)
Made pt aware of bed assignment 

## 2014-09-22 NOTE — ED Notes (Signed)
Attempted report 

## 2014-09-22 NOTE — Progress Notes (Addendum)
ANTIBIOTIC CONSULT NOTE - INITIAL  Pharmacy Consult for aztreonam Indication: intra-abdominal infection  Allergies  Allergen Reactions  . Penicillins Rash    Patient Measurements: Height: 5\' 9"  (175.3 cm) Weight: 160 lb (72.576 kg) IBW/kg (Calculated) : 70.7  Vital Signs: Temp: 98.6 F (37 C) (05/03 0507) Temp Source: Oral (05/03 0507) BP: 120/67 mmHg (05/03 1230) Pulse Rate: 78 (05/03 1230) Intake/Output from previous day:   Intake/Output from this shift: Total I/O In: -  Out: 800 [Urine:800]  Labs:  Recent Labs  09/22/14 0543  WBC 12.1*  HGB 14.3  PLT 216  CREATININE 0.80   Estimated Creatinine Clearance: 84.7 mL/min (by C-G formula based on Cr of 0.8). No results for input(s): VANCOTROUGH, VANCOPEAK, VANCORANDOM, GENTTROUGH, GENTPEAK, GENTRANDOM, TOBRATROUGH, TOBRAPEAK, TOBRARND, AMIKACINPEAK, AMIKACINTROU, AMIKACIN in the last 72 hours.   Microbiology: No results found for this or any previous visit (from the past 720 hour(s)).  Medical History: Past Medical History  Diagnosis Date  . Stroke   . GERD (gastroesophageal reflux disease)   . Hypertension   . Hernia   . Hyperlipidemia   . HTN (hypertension) 10/24/2012  . Osteoporosis, unspecified 10/24/2012  . 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 Other and unspecified hyperlipidemia 10/24/2012  . Hiatal hernia 10/24/2012  . Pneumonia     Medications:  See electronic PTA med list  Assessment: 74 y/o male who presented to the ED with SOB related to pain from a hiatal hernia. Concern for intra-abdominal infection. Pharmacy consulted to begin aztreonam (has PCN allergy). Renal function is normal. Lipase and LFTs are elevated. WBC are slightly elevated at 12.1. He received ciprofloxacin 400 mg IV at 11:39 and metronidazole 500 mg IV at 10:24.  Goal of Therapy:  Eradication of infection  Plan:  - Aztreonam 1 g IV q8h - Monitor renal function and clinical progress - Text paged  Dr. [place] Pink and suggested Flagyl for anaerobic coverage  [facility], Pharm.D., BCPS Clinical Pharmacist Pager: [phone] 09/22/2014 1:22 PM

## 2014-09-22 NOTE — ED Provider Notes (Signed)
Imaging shows cholelithiasis without cholecystitis. No CBD stone or pancreatic mass. Patient with leukocytosis but no fever. We'll check lactate. Admission discussed with Dr. [place] Pink. We'll add Cipro and Flagyl the low suspicion for cholangitis. Patient with typical hydration status is a hard he has edema his x-ray but needs IV hydration for his pancreatitis. He has no hypoxia at rest   Gregory Essex, MD 09/22/14 [phone]

## 2014-09-22 NOTE — ED Notes (Signed)
Spoke with patient about need for urine. Patient states he cannot urinate at this time.

## 2014-09-22 NOTE — ED Notes (Signed)
Called Ct to report patient has finished drinking contrast.

## 2014-09-22 NOTE — H&P (Signed)
History and Physical  Gregory Kerr ZOX:[id number] DOB: 1940-06-14 DOA: 09/22/2014  Referring physician: Harlow Mares, ER physician PCP: Blanchie Serve, MD   Chief Complaint: Abdominal pain  HPI: Gregory Kerr is a 74 y.o. 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 Although this was slightly different than usual presentation, patient attributed this to his previous episodes of hiatal hernia. However, when symptoms persisted and he started having nausea and vomiting, he was brought into the emergency room for further evaluation. Initial blood work was notable for a mildly elevated white count 12.1, alkaline phosphatase level of 475, lipase of 208 and mildly elevated transaminases. Bilirubin also noted elevated at 3.3. CT scan of the abdomen and pelvis noted acute pancreatitis and mildly thickened gallbladder wall with gallstones. Suspected gallstone pancreatitis with secondary cholangitis and given elevated white blood cell count (lactic acid level normal), patient given IV Cipro and Flagyl. (No previous Cipro allergy, but patient started getting allergic reaction to Cipro so this was stopped and changed over to aztreonam)  hospitalists were called for further evaluation and admission.   Review of Systems:  Patient seen in the emergency room. Pt complains of mild midepigastric abdominal pain, although much improved from previous.  Pt denies any headaches, vision changes, dysphagia, chest pain, palpitations, shortness of breath. Denies any wheezing or coughing, hematuria, dysuria, constipation, diarrhea. His chronic left-sided contractures and weakness.  Review of systems are otherwise negative  Past Medical History  Diagnosis Date  . Stroke   . GERD (gastroesophageal reflux disease)   . Hypertension   . Hernia   . Hyperlipidemia   . HTN (hypertension) 10/24/2012    . Osteoporosis, unspecified 10/24/2012  . 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 Other and unspecified hyperlipidemia 10/24/2012  . Hiatal hernia 10/24/2012  . Pneumonia    Past Surgical History  Procedure Laterality Date  . Appendectomy  05/27/2009    Ronnald Collum, MD  . Cataract extraction Bilateral Patient unsure. States several years ago.   Social History:  reports that he quit smoking about 8 years ago. His smoking use included Cigarettes. He smoked 0.50 packs per day. He has never used smokeless tobacco. He reports that he does not drink alcohol or use illicit drugs. Patient lives at a nursing facility & is able to get around using a wheelchair, restricted because of previous stroke and left-sided hemiplegia  Allergies  Allergen Reactions  . Penicillins Rash    Family History  Problem Relation Age of Onset  . Heart attack Mother   . Heart attack Father   . Cancer Maternal Aunt     Stomach  . Cancer Paternal [name]      Prior to Admission medications   Medication Sig Start Date End Date Taking? Authorizing Provider  acetaminophen (TYLENOL) 650 MG CR tablet Take 650 mg by mouth every 4 (four) hours as needed for pain or fever.   Yes Historical Provider, MD  amLODipine (NORVASC) 5 MG tablet Take 5 mg by mouth daily.   Yes Historical Provider, MD  baclofen (LIORESAL) 10 MG tablet Take 10 mg by mouth 4 (four) times daily.   Yes Historical Provider, MD  calcium carbonate (OS-CAL - DOSED IN MG OF ELEMENTAL CALCIUM) 1250 MG tablet Take 1 tablet by mouth 2 (two) times daily with a meal. 500mg  elemental   Yes Historical  Provider, MD  Cholecalciferol (VITAMIN D3) 2000 UNITS capsule Take 2,000 Units by mouth daily.   Yes Historical Provider, MD  Dextromethorphan-Quinidine (NUEDEXTA) 20-10 MG CAPS Take 1 capsule by mouth daily.   Yes Historical Provider, MD  dicyclomine (BENTYL) 20 MG tablet Take 10 mg by mouth 3 (three) times daily.    Yes  Historical Provider, MD  dipyridamole-aspirin (AGGRENOX) 200-25 MG per 12 hr capsule Take 1 capsule by mouth 2 (two) times daily.   Yes Historical Provider, MD  furosemide (LASIX) 20 MG tablet Take 20 mg by mouth daily.   Yes Historical Provider, MD  gabapentin (NEURONTIN) 100 MG capsule Take 100 mg by mouth 3 (three) times daily.   Yes Historical Provider, MD  loratadine (CLARITIN) 10 MG tablet Take 10 mg by mouth daily as needed for allergies.   Yes Historical Provider, MD  metoprolol tartrate (LOPRESSOR) 25 MG tablet Take 0.5 tablets (12.5 mg total) by mouth 2 (two) times daily. 04/06/14  Yes Verlee Monte, MD  nitroGLYCERIN (NITROSTAT) 0.4 MG SL tablet Place 0.4 mg under the tongue every 5 (five) minutes as needed for chest pain.   Yes Historical Provider, MD  omeprazole (PRILOSEC) 20 MG capsule Take 20 mg by mouth daily.   Yes Historical Provider, MD  Prenatal Vit-Fe Fumarate-FA (PREPLUS) 27-1 MG TABS Take 1 tablet by mouth 2 (two) times daily.   Yes Historical Provider, MD  promethazine (PHENERGAN) 25 MG tablet Take 25 mg by mouth every 6 (six) hours as needed for nausea or vomiting.   Yes Historical Provider, MD  simvastatin (ZOCOR) 20 MG tablet Take 30 mg by mouth every evening. For hyperlipidemia /CVA   Yes Historical Provider, MD    Physical Exam: BP 125/84 mmHg  Pulse 78  Temp(Src) 99 F (37.2 C) (Oral)  Resp 17  Ht 5\' 9"  (1.753 m)  Wt 72.576 kg (160 lb)  BMI 23.62 kg/m2  SpO2 96%  General:  Alert and oriented 3, no acute distress Eyes: Sclera nonicteric, chocolate movements are intact ENT: Normocephalic management, mucous members are slightly dry Neck: No JVD Cardiovascular: Regular rate and rhythm, S1-S2 Respiratory: Clear to auscultation bilaterally Abdomen: Soft, mild tenderness in the midepigastric area Skin: No skin breaks, tears or lesions Musculoskeletal: No clubbing or cyanosis or edema, see below Psychiatric: patient is appropriate, no evidence of  psychoses Neurologic: left-sided hemiplegia           Labs on Admission:  Basic Metabolic Panel:  Recent Labs Lab 09/22/14 0543 09/22/14 1248  NA 139 136  K 3.7 3.7  CL 106 105  CO2 20* 21*  GLUCOSE 118* 105*  BUN 16 14  CREATININE 0.80 0.86  CALCIUM 8.9 8.3*   Liver Function Tests:  Recent Labs Lab 09/22/14 0543 09/22/14 1248  AST 103* 94*  ALT 142* 129*  ALKPHOS 475* 420*  BILITOT 3.3* 4.0*  PROT 7.4 6.8  ALBUMIN 3.2* 2.8*    Recent Labs Lab 09/22/14 0543  LIPASE 208*   No results for input(s): AMMONIA in the last 168 hours. CBC:  Recent Labs Lab 09/22/14 0543  WBC 12.1*  NEUTROABS 10.7*  HGB 14.3  HCT 42.3  MCV 87.0  PLT 216   Cardiac Enzymes:  Recent Labs Lab 09/22/14 0543  TROPONINI <0.03    BNP (last 3 results)  Recent Labs  09/22/14 0543  BNP 80.2    ProBNP (last 3 results)  Recent Labs  04/03/14 0742  PROBNP 205.3*    CBG: No results for input(s):  GLUCAP in the last 168 hours.  Radiological Exams on Admission: US Abdomen Complete  09/22/2014   CLINICAL DATA:  Abdominal pain.  Patient has not been NPO.  EXAM: COMPLETE ABDOMINAL ULTRASOUND  COMPARISON:  CT 04/11/2012  FINDINGS: Gallbladder: Layering sludge with some small echogenic subcentimeter calculi. Borderline gallbladder wall thickening possibly secondary date incomplete distention. No pericholecystic fluid. Sonographer reports no sonographic Murphy's sign.  Common bile duct:  Normal in caliber, 44mm diameter.  Liver: Homogeneous in echotexture without focal lesion or intrahepatic bile duct dilatation.  IVC:  Negative  Pancreas: Visualized segments unremarkable, portions obscured by overlying bowel gas.  Spleen:  No focal lesion, craniocaudal 7.8cm in length.  Right Kidney:  No mass or hydronephrosis, 11.1cm in length.  Left Kidney:  No lesion or hydronephrosis, 10.6cm in length.  Abdominal aorta: Visualized segments unremarkable, portions obscured by overlying bowel gas.   IMPRESSION: 1. Cholelithiasis without other ultrasound evidence of cholecystitis or biliary obstruction.   Electronically Signed   By: Lucrezia Europe M.D.   On: 09/22/2014 09:09   Ct Abdomen Pelvis W Contrast  09/22/2014   CLINICAL DATA:  Abdominal pain, left side abdominal pain, history of hiatal hernia  EXAM: CT ABDOMEN AND PELVIS WITH CONTRAST  TECHNIQUE: Multidetector CT imaging of the abdomen and pelvis was performed using the standard protocol following bolus administration of intravenous contrast.  CONTRAST:  79mL OMNIPAQUE IOHEXOL 300 MG/ML  SOLN  COMPARISON:  04/11/2012  FINDINGS: There is a large hiatal hernia measuring 10.5 x 8 cm with at least 2/3 rd of proximal stomach in left lower chest retrocardiac position. There is no evidence of acute gastric volvulus. No gastric outlet obstruction.  Mild hepatic fatty infiltration. No focal hepatic mass. Small gallstones are noted in gallbladder neck region the largest measures 6.5 mm. Borderline thickening of gallbladder wall up to 2.9 mm without evidence of pericholecystic fluid. Again noted mild pancreatic atrophy with fatty replacement. The spleen and adrenal glands are unremarkable. Again noted lobulated renal contour. No hydronephrosis or hydroureter. Cystic lesion along the uncinate process of the pancreas again noted measures 1.7 cm stable in size in appearance from prior exam. In a small portal caval lymph node measures 1.2 by 0.9 cm stable in size in appearance from prior exam.  Atherosclerotic calcifications of abdominal aorta and iliac arteries. No aortic aneurysm.  There is a cyst in midpole of the right kidney anterolateral aspect measures 1.2 cm. Delayed renal images shows bilateral renal symmetrical excretion. A cyst in upper pole of the right kidney posterior aspect measures 1 cm.  Bilateral visualized proximal ureter is unremarkable.  No small bowel obstruction. No ascites or free air. No adenopathy. Oral contrast material is noted in distal small  bowel terminal ileum and right colon. There is no pericecal inflammation. The terminal ileum is unremarkable. The patient is status post appendectomy. There is some stool in distal left colon and sigmoid colon. No evidence of colonic obstruction.  There is a right inguinal scrotal canal hernia containing fat and a loop of terminal small bowel measures 4.4 cm without evidence of small bowel obstruction. There is a left inguinal scrotal canal hernia containing fat measures 3.5 cm without evidence of acute complication. Prostate gland calcifications are noted. Mild enlarged prostate gland measures 4.9 by 3.5 cm. The urinary bladder is unremarkable.  IMPRESSION: 1. Again noted large hiatal hernia with at least 2/3 of proximal stomach in left lower chest retrocardiac position. There is no evidence of gastric volvulus or gastric outlet  obstruction. 2. No small bowel or colonic obstruction. 3. Again noted gallstones within gallbladder. Borderline thickening of gallbladder wall up to 2.9 mm. No evidence of pericholecystic fluid. 4. Again noted atrophic pancreas with partial fatty replacement. Stable cystic lesion along the uncinate process of the pancreas. 5. No small bowel obstruction. Again noted right inguinal scrotal hernia containing small bowel without evidence of acute complication or small bowel obstruction. Again noted a left inguinal scrotal canal hernia containing fat without evidence of acute complication. 6. Again noted mild enlarged prostate gland. 7. Mild hepatic fatty infiltration. 8. No hydronephrosis or hydroureter.  Right renal cysts are noted   Electronically Signed   By: Lahoma Crocker M.D.   On: 09/22/2014 09:28   Dg Chest Port 1 View  09/22/2014   CLINICAL DATA:  Dyspnea  EXAM: PORTABLE CHEST - 1 VIEW  COMPARISON:  04/03/2014  FINDINGS: There is a large hiatal hernia. There is interstitial fluid or thickening a there is mild vascular fullness. This likely represents congestive heart failure. No airspace  consolidation is evident. There is no large effusion.  IMPRESSION: Congestive heart failure   Electronically Signed   By: Andreas Newport M.D.   On: 09/22/2014 06:13    EKG: Independently reviewed. Sinus rhythm with prolonged QT interval  Assessment/Plan Present on Admission:   . Chronic diastolic heart failure: No signs of acute volume overload. We'll gently hydrate. Follow daily weights. Initial BNP normal.   . Essential hypertension: Holding home medication secondary dehydration  . 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  . Gallstone pancreatitis with secondary acute cholangitis: . Follow-up labs noted mild improvement in transaminases, however bilirubin looks slightly worse. Spoke with [facility]. Next step is for MRCP and if stone impacted, they will formally consult. If no stone, then thing should improve and patient will eventually need surgical evaluation. Given mildly elevated white blood cell count, IV Cipro and Flagyl. Lactic acid levels have all been normal.  Consultants: [facility]  Code Status: Full code as confirmed by patient  Family Communication: Updated sister by phone  Disposition Plan: Here for several days at least  Time spent: 37 minutes  [facility] Pager [phone]

## 2014-09-22 NOTE — ED Notes (Signed)
Called main lab, spoke to [place] in regards to results of troponin, cmp, and lipase.

## 2014-09-23 ENCOUNTER — Inpatient Hospital Stay (HOSPITAL_COMMUNITY): Payer: Medicare Other

## 2014-09-23 ENCOUNTER — Encounter (HOSPITAL_COMMUNITY): Payer: Self-pay | Admitting: General Surgery

## 2014-09-23 DIAGNOSIS — F482 Pseudobulbar affect: Secondary | ICD-10-CM

## 2014-09-23 DIAGNOSIS — E876 Hypokalemia: Secondary | ICD-10-CM

## 2014-09-23 DIAGNOSIS — I10 Essential (primary) hypertension: Secondary | ICD-10-CM

## 2014-09-23 LAB — COMPREHENSIVE METABOLIC PANEL
ALBUMIN: 2.6 g/dL — AB (ref 3.5–5.0)
ALT: 101 U/L — ABNORMAL HIGH (ref 17–63)
AST: 56 U/L — AB (ref 15–41)
Alkaline Phosphatase: 397 U/L — ABNORMAL HIGH (ref 38–126)
Anion gap: 11 (ref 5–15)
BILIRUBIN TOTAL: 5.6 mg/dL — AB (ref 0.3–1.2)
BUN: 9 mg/dL (ref 6–20)
CALCIUM: 8.5 mg/dL — AB (ref 8.9–10.3)
CO2: 19 mmol/L — AB (ref 22–32)
Chloride: 107 mmol/L (ref 101–111)
Creatinine, Ser: 0.68 mg/dL (ref 0.61–1.24)
GFR calc Af Amer: >60 mL/min (ref >60)
Glucose, Bld: 119 mg/dL — ABNORMAL HIGH (ref 70–99)
POTASSIUM: 3.4 mmol/L — AB (ref 3.5–5.1)
SODIUM: 137 mmol/L (ref 135–145)
Total Protein: 6.5 g/dL (ref 6.5–8.1)

## 2014-09-23 LAB — CBC
HCT: 39.8 % (ref 39.0–52.0)
HEMOGLOBIN: 13.3 g/dL (ref 13.0–17.0)
MCH: 29.2 pg (ref 26.0–34.0)
MCHC: 33.4 g/dL (ref 30.0–36.0)
MCV: 87.3 fL (ref 78.0–100.0)
PLATELETS: 206 10*3/uL (ref 150–400)
RBC: 4.56 MIL/uL (ref 4.22–5.81)
RDW: 15.5 % (ref 11.5–15.5)
WBC: 6.4 10*3/uL (ref 4.0–10.5)

## 2014-09-23 LAB — LIPASE, BLOOD: Lipase: 18 U/L — ABNORMAL LOW (ref 22–51)

## 2014-09-23 MED ORDER — POTASSIUM CHLORIDE IN NACL 20-0.9 MEQ/L-% IV SOLN
INTRAVENOUS | Status: DC
  Administered 2014-09-23 – 2014-09-25 (×3): via INTRAVENOUS
  Filled 2014-09-23 (×8): qty 1000

## 2014-09-23 MED ORDER — ASPIRIN-DIPYRIDAMOLE ER 25-200 MG PO CP12
1.0000 | ORAL_CAPSULE | Freq: Two times a day (BID) | ORAL | Status: DC
  Administered 2014-09-23: 1 via ORAL
  Filled 2014-09-23 (×3): qty 1

## 2014-09-23 MED ORDER — GADOBENATE DIMEGLUMINE 529 MG/ML IV SOLN
15.0000 mL | Freq: Once | INTRAVENOUS | Status: AC
  Administered 2014-09-23: 15 mL via INTRAVENOUS

## 2014-09-23 NOTE — Consult Note (Signed)
Reason for Consult: choledocholithiasis Referring Physician: Dr. 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  Onset was sudden on Monday night.  Time pattern is constant.  Location is left abdomen with radiation to the back.  No aggravating or alleviating factors.  No modifying factors.  He denied any nausea or vomiting.  He has had some diarrhea, but that is secondary to the abx therapy he was on for his recent UTI.  He denied any fevers or chills.  He presented to [facility] for further evaluation.  He was found to have gallstone pancreatitis and was admitted.  Labs yesterday show total bilirubin 4, today 5.6.   Lipase was 208, now down to 18. White count was 12k today down to 6.4k.  Abdominal US shows cholelithiasis.  MRCP reveals choledocholithiasis. His symptoms are now improved.  We have been asked to evaluate the patient for further evaluation.      Past Medical History  Diagnosis Date  . GERD (gastroesophageal reflux disease)   . Hypertension   . Hernia     "large one; on my left stomach" (09/22/2014)  . Hyperlipidemia   . HTN (hypertension) 10/24/2012  . Osteoporosis, unspecified 10/24/2012  . 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 Hiatal hernia 10/24/2012  . Pneumonia     "once or twice" (09/22/2014)  . Stroke 2007    "left side doesn't work now" (09/22/2014)    Past Surgical History  Procedure Laterality Date  . Appendectomy  05/27/2009    Ronnald Collum, MD  . Cataract extraction w/ intraocular lens  implant, bilateral Bilateral ?2014    Family History  Problem Relation Age of Onset  . Heart attack Mother   . Heart attack Father   . Cancer Maternal Aunt     Stomach  . Cancer Paternal Aunt     Uterine,Colon    Social History:  reports that he quit smoking about 8 years ago. His smoking use included  Cigarettes. He smoked 0.50 packs per day. He has never used smokeless tobacco. He reports that he drinks alcohol. He reports that he does not use illicit drugs.  Allergies:  Allergies  Allergen Reactions  . Penicillins Rash    Medications: I have reviewed the patient's current medications.  Results for orders placed or performed during the hospital encounter of 09/22/14 (from the past 48 hour(s))  CBC with Differential/Platelet     Status: Abnormal   Collection Time: 09/22/14  5:43 AM  Result Value Ref Range   WBC 12.1 (H) 4.0 - 10.5 K/uL   RBC 4.86 4.22 - 5.81 MIL/uL   Hemoglobin 14.3 13.0 - 17.0 g/dL   HCT 42.3 39.0 - 52.0 %   MCV 87.0 78.0 - 100.0 fL   MCH 29.4 26.0 - 34.0 pg   MCHC 33.8 30.0 - 36.0 g/dL   RDW 15.1 11.5 - 15.5 %   Platelets 216 150 - 400 K/uL   Neutrophils Relative % 88 (H) 43 - 77 %   Neutro Abs 10.7 (H) 1.7 - 7.7 K/uL   Lymphocytes Relative 5 (L) 12 - 46 %   Lymphs Abs 0.6 (L) 0.7 - 4.0 K/uL   Monocytes Relative 7 3 - 12 %   Monocytes Absolute 0.8 0.1 - 1.0 K/uL   Eosinophils Relative 0 0 - 5 %  Eosinophils Absolute 0.1 0.0 - 0.7 K/uL   Basophils Relative 0 0 - 1 %   Basophils Absolute 0.0 0.0 - 0.1 K/uL  Comprehensive metabolic panel     Status: Abnormal   Collection Time: 09/22/14  5:43 AM  Result Value Ref Range   Sodium 139 135 - 145 mmol/L   Potassium 3.7 3.5 - 5.1 mmol/L   Chloride 106 101 - 111 mmol/L   CO2 20 (L) 22 - 32 mmol/L   Glucose, Bld 118 (H) 70 - 99 mg/dL   BUN 16 6 - 20 mg/dL   Creatinine, Ser 0.80 0.61 - 1.24 mg/dL   Calcium 8.9 8.9 - 10.3 mg/dL   Total Protein 7.4 6.5 - 8.1 g/dL   Albumin 3.2 (L) 3.5 - 5.0 g/dL   AST 103 (H) 15 - 41 U/L   ALT 142 (H) 17 - 63 U/L   Alkaline Phosphatase 475 (H) 38 - 126 U/L   Total Bilirubin 3.3 (H) 0.3 - 1.2 mg/dL   GFR calc non Af Amer >60 >60 mL/min   GFR calc Af Amer >60 >60 mL/min    Comment: (NOTE) The eGFR has been calculated using the CKD EPI equation. This calculation has not been  validated in all clinical situations. eGFR's persistently <90 mL/min signify possible Chronic Kidney Disease.    Anion gap 13 5 - 15  Troponin I     Status: None   Collection Time: 09/22/14  5:43 AM  Result Value Ref Range   Troponin I <0.03 <0.031 ng/mL    Comment:        NO INDICATION OF MYOCARDIAL INJURY.   Lipase, blood     Status: Abnormal   Collection Time: 09/22/14  5:43 AM  Result Value Ref Range   Lipase 208 (H) 22 - 51 U/L  Brain natriuretic peptide     Status: None   Collection Time: 09/22/14  5:43 AM  Result Value Ref Range   B Natriuretic Peptide 80.2 0.0 - 100.0 pg/mL  Urinalysis, Routine w reflex microscopic     Status: Abnormal   Collection Time: 09/22/14  7:26 AM  Result Value Ref Range   Color, Urine AMBER (A) YELLOW    Comment: BIOCHEMICALS MAY BE AFFECTED BY COLOR   APPearance CLEAR CLEAR   Specific Gravity, Urine 1.025 1.005 - 1.030   pH 6.0 5.0 - 8.0   Glucose, UA NEGATIVE NEGATIVE mg/dL   Hgb urine dipstick NEGATIVE NEGATIVE   Bilirubin Urine MODERATE (A) NEGATIVE   Ketones, ur 15 (A) NEGATIVE mg/dL   Protein, ur NEGATIVE NEGATIVE mg/dL   Urobilinogen, UA 1.0 0.0 - 1.0 mg/dL   Nitrite NEGATIVE NEGATIVE   Leukocytes, UA TRACE (A) NEGATIVE  Urine microscopic-add on     Status: Abnormal   Collection Time: 09/22/14  7:26 AM  Result Value Ref Range   Squamous Epithelial / LPF RARE RARE   WBC, UA 7-10 <3 WBC/hpf   RBC / HPF 3-6 <3 RBC/hpf   Bacteria, UA FEW (A) RARE   Crystals CA OXALATE CRYSTALS (A) NEGATIVE  Lactic acid, plasma     Status: None   Collection Time: 09/22/14 10:00 AM  Result Value Ref Range   Lactic Acid, Venous 1.3 0.5 - 2.0 mmol/L  I-Stat CG4 Lactic Acid, ED     Status: None   Collection Time: 09/22/14 10:10 AM  Result Value Ref Range   Lactic Acid, Venous 1.17 0.5 - 2.0 mmol/L  Lactic acid, plasma  Status: None   Collection Time: 09/22/14 12:48 PM  Result Value Ref Range   Lactic Acid, Venous 1.2 0.5 - 2.0 mmol/L   Comprehensive metabolic panel     Status: Abnormal   Collection Time: 09/22/14 12:48 PM  Result Value Ref Range   Sodium 136 135 - 145 mmol/L   Potassium 3.7 3.5 - 5.1 mmol/L   Chloride 105 101 - 111 mmol/L   CO2 21 (L) 22 - 32 mmol/L   Glucose, Bld 105 (H) 70 - 99 mg/dL   BUN 14 6 - 20 mg/dL   Creatinine, Ser 0.86 0.61 - 1.24 mg/dL   Calcium 8.3 (L) 8.9 - 10.3 mg/dL   Total Protein 6.8 6.5 - 8.1 g/dL   Albumin 2.8 (L) 3.5 - 5.0 g/dL   AST 94 (H) 15 - 41 U/L   ALT 129 (H) 17 - 63 U/L   Alkaline Phosphatase 420 (H) 38 - 126 U/L   Total Bilirubin 4.0 (H) 0.3 - 1.2 mg/dL   GFR calc non Af Amer >60 >60 mL/min   GFR calc Af Amer >60 >60 mL/min    Comment: (NOTE) The eGFR has been calculated using the CKD EPI equation. This calculation has not been validated in all clinical situations. eGFR's persistently <90 mL/min signify possible Chronic Kidney Disease.    Anion gap 10 5 - 15  I-Stat CG4 Lactic Acid, ED     Status: None   Collection Time: 09/22/14 12:57 PM  Result Value Ref Range   Lactic Acid, Venous 1.19 0.5 - 2.0 mmol/L  Comprehensive metabolic panel     Status: Abnormal   Collection Time: 09/23/14  6:05 AM  Result Value Ref Range   Sodium 137 135 - 145 mmol/L   Potassium 3.4 (L) 3.5 - 5.1 mmol/L   Chloride 107 101 - 111 mmol/L   CO2 19 (L) 22 - 32 mmol/L   Glucose, Bld 119 (H) 70 - 99 mg/dL   BUN 9 6 - 20 mg/dL   Creatinine, Ser 0.68 0.61 - 1.24 mg/dL   Calcium 8.5 (L) 8.9 - 10.3 mg/dL   Total Protein 6.5 6.5 - 8.1 g/dL   Albumin 2.6 (L) 3.5 - 5.0 g/dL   AST 56 (H) 15 - 41 U/L   ALT 101 (H) 17 - 63 U/L   Alkaline Phosphatase 397 (H) 38 - 126 U/L   Total Bilirubin 5.6 (H) 0.3 - 1.2 mg/dL   GFR calc non Af Amer >60 >60 mL/min   GFR calc Af Amer >60 >60 mL/min    Comment: (NOTE) The eGFR has been calculated using the CKD EPI equation. This calculation has not been validated in all clinical situations. eGFR's persistently <90 mL/min signify possible Chronic  Kidney Disease.    Anion gap 11 5 - 15  CBC     Status: None   Collection Time: 09/23/14  6:05 AM  Result Value Ref Range   WBC 6.4 4.0 - 10.5 K/uL   RBC 4.56 4.22 - 5.81 MIL/uL   Hemoglobin 13.3 13.0 - 17.0 g/dL   HCT 39.8 39.0 - 52.0 %   MCV 87.3 78.0 - 100.0 fL   MCH 29.2 26.0 - 34.0 pg   MCHC 33.4 30.0 - 36.0 g/dL   RDW 15.5 11.5 - 15.5 %   Platelets 206 150 - 400 K/uL  Lipase, blood     Status: Abnormal   Collection Time: 09/23/14  6:05 AM  Result Value Ref Range   Lipase 18 (L)  22 - 51 U/L    US Abdomen Complete  09/22/2014   CLINICAL DATA:  Abdominal pain.  Patient has not been NPO.  EXAM: COMPLETE ABDOMINAL ULTRASOUND  COMPARISON:  CT 04/11/2012  FINDINGS: Gallbladder: Layering sludge with some small echogenic subcentimeter calculi. Borderline gallbladder wall thickening possibly secondary date incomplete distention. No pericholecystic fluid. Sonographer reports no sonographic Murphy's sign.  Common bile duct:  Normal in caliber, 52m diameter.  Liver: Homogeneous in echotexture without focal lesion or intrahepatic bile duct dilatation.  IVC:  Negative  Pancreas: Visualized segments unremarkable, portions obscured by overlying bowel gas.  Spleen:  No focal lesion, craniocaudal 7.8cm in length.  Right Kidney:  No mass or hydronephrosis, 11.1cm in length.  Left Kidney:  No lesion or hydronephrosis, 10.6cm in length.  Abdominal aorta: Visualized segments unremarkable, portions obscured by overlying bowel gas.  IMPRESSION: 1. Cholelithiasis without other ultrasound evidence of cholecystitis or biliary obstruction.   Electronically Signed   By: DLucrezia EuropeM.D.   On: 09/22/2014 09:09   Ct Abdomen Pelvis W Contrast  09/22/2014   CLINICAL DATA:  Abdominal pain, left side abdominal pain, history of hiatal hernia  EXAM: CT ABDOMEN AND PELVIS WITH CONTRAST  TECHNIQUE: Multidetector CT imaging of the abdomen and pelvis was performed using the standard protocol following bolus administration of  intravenous contrast.  CONTRAST:  817mOMNIPAQUE IOHEXOL 300 MG/ML  SOLN  COMPARISON:  04/11/2012  FINDINGS: There is a large hiatal hernia measuring 10.5 x 8 cm with at least 2/3 rd of proximal stomach in left lower chest retrocardiac position. There is no evidence of acute gastric volvulus. No gastric outlet obstruction.  Mild hepatic fatty infiltration. No focal hepatic mass. Small gallstones are noted in gallbladder neck region the largest measures 6.5 mm. Borderline thickening of gallbladder wall up to 2.9 mm without evidence of pericholecystic fluid. Again noted mild pancreatic atrophy with fatty replacement. The spleen and adrenal glands are unremarkable. Again noted lobulated renal contour. No hydronephrosis or hydroureter. Cystic lesion along the uncinate process of the pancreas again noted measures 1.7 cm stable in size in appearance from prior exam. In a small portal caval lymph node measures 1.2 by 0.9 cm stable in size in appearance from prior exam.  Atherosclerotic calcifications of abdominal aorta and iliac arteries. No aortic aneurysm.  There is a cyst in midpole of the right kidney anterolateral aspect measures 1.2 cm. Delayed renal images shows bilateral renal symmetrical excretion. A cyst in upper pole of the right kidney posterior aspect measures 1 cm.  Bilateral visualized proximal ureter is unremarkable.  No small bowel obstruction. No ascites or free air. No adenopathy. Oral contrast material is noted in distal small bowel terminal ileum and right colon. There is no pericecal inflammation. The terminal ileum is unremarkable. The patient is status post appendectomy. There is some stool in distal left colon and sigmoid colon. No evidence of colonic obstruction.  There is a right inguinal scrotal canal hernia containing fat and a loop of terminal small bowel measures 4.4 cm without evidence of small bowel obstruction. There is a left inguinal scrotal canal hernia containing fat measures 3.5 cm  without evidence of acute complication. Prostate gland calcifications are noted. Mild enlarged prostate gland measures 4.9 by 3.5 cm. The urinary bladder is unremarkable.  IMPRESSION: 1. Again noted large hiatal hernia with at least 2/3 of proximal stomach in left lower chest retrocardiac position. There is no evidence of gastric volvulus or gastric outlet obstruction. 2. No small  bowel or colonic obstruction. 3. Again noted gallstones within gallbladder. Borderline thickening of gallbladder wall up to 2.9 mm. No evidence of pericholecystic fluid. 4. Again noted atrophic pancreas with partial fatty replacement. Stable cystic lesion along the uncinate process of the pancreas. 5. No small bowel obstruction. Again noted right inguinal scrotal hernia containing small bowel without evidence of acute complication or small bowel obstruction. Again noted a left inguinal scrotal canal hernia containing fat without evidence of acute complication. 6. Again noted mild enlarged prostate gland. 7. Mild hepatic fatty infiltration. 8. No hydronephrosis or hydroureter.  Right renal cysts are noted   Electronically Signed   By: Lahoma Crocker M.D.   On: 09/22/2014 09:28   Dg Chest Port 1 View  09/22/2014   CLINICAL DATA:  Dyspnea  EXAM: PORTABLE CHEST - 1 VIEW  COMPARISON:  04/03/2014  FINDINGS: There is a large hiatal hernia. There is interstitial fluid or thickening a there is mild vascular fullness. This likely represents congestive heart failure. No airspace consolidation is evident. There is no large effusion.  IMPRESSION: Congestive heart failure   Electronically Signed   By: Andreas Newport M.D.   On: 09/22/2014 06:13   Mr Jeananne Rama W/wo Cm/mrcp  09/23/2014   CLINICAL DATA:  74 year old male with mid epigastric pain radiating to his back.  EXAM: MRI ABDOMEN WITHOUT AND WITH CONTRAST (INCLUDING MRCP)  TECHNIQUE: Multiplanar multisequence MR imaging of the abdomen was performed both before and after the administration of intravenous  contrast. Heavily T2-weighted images of the biliary and pancreatic ducts were obtained, and three-dimensional MRCP images were rendered by post processing.  CONTRAST:  61m MULTIHANCE GADOBENATE DIMEGLUMINE 529 MG/ML IV SOLN  COMPARISON:  CT the abdomen and pelvis 09/22/2014.  FINDINGS: Comment: Poor image quality throughout the examination secondary to extensive patient respiratory motion.  Lower chest:  Moderate to large hiatal hernia.  Hepatobiliary: Several tiny filling defects within the gallbladder, compatible with gallstones. Gallbladder does not appear distended, gallbladder wall thickness is normal, there is no pericholecystic fluid to suggest acute cholecystitis at this time. However, there several small filling defects within the mid to distal common bile duct, compatible with ductal stones. This is best appreciated on the coronal T2 weighted sequence on images 23 and 24 of series 4. At this time, these stones appeared to be nonobstructive, as the common bile duct measures up to 6-7 mm (within normal limits for the patient's age), and there is no significant intrahepatic biliary ductal dilatation. No discrete cystic or solid hepatic lesions.  Pancreas: Well-defined 11 x 15 mm T1 hypointense, T2 hyperintense, nonenhancing lesion in the uncinate process of the pancreas, similar to slightly smaller than prior studies dating back to at least 04/11/2012, presumably a benign lesion such as a small pancreatic pseudocyst. A similar appearing 1 cm lesion in the tail of the pancreas is also noted, similar to recent prior study 09/22/2014, but cannot be confirmed on more remote prior examinations secondary to lack of IV contrast. No pancreatic ductal dilatation. No pancreatic or peripancreatic inflammatory changes.  Spleen: Unremarkable.  Adrenals/Urinary Tract: There are small lesions in the kidneys bilaterally which are T1 hypointense, T2 hyperintense, and do not appear to enhance (although accurate assessment is  limited by extensive patient motion), favored to represent tiny cysts. Bilateral adrenal glands are normal in appearance.  Stomach/Bowel: Moderate to large hiatal hernia. Otherwise, unremarkable.  Vascular/Lymphatic: No aneurysm identified in the visualized abdominal vasculature. No lymphadenopathy.  Other: No significant volume of ascites in the  visualized portions of the peritoneal cavity.  Musculoskeletal: No aggressive appearing osseous lesions are noted in the visualized skeleton.  IMPRESSION: 1. Choledocholithiasis and cholelithiasis, without signs of frank biliary tract obstruction or acute cholecystitis at this time. Surgical consultation is recommended. 2. 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 There is also a 1 cm simple appearing cystic lesion in the tail of the pancreas, also favored to be a benign lesion, however, this cannot be confirmed on remote prior examinations. Repeat MRI of the abdomen with and without IV gadolinium in 1 year is recommended to ensure continued stability of this lesion. This recommendation follows ACR consensus guidelines: Managing Incidental Findings on Abdominal CT: White Paper of the ACR Incidental Findings Committee. J Am Coll Radiol 2010;7:754-773. 3. At the time of follow-up abdominal MRI, attention should be directed to the small cystic lesions in the kidneys to ensure their stability is well. These are favored to represent simple cysts, however, today's study was limited by considerable respiratory motion. The patient should be counseled prior to the followup examination to maintain strict breath holding during the examination to ensure better image quality. 4. Moderate to large hiatal hernia.   Electronically Signed   By: Vinnie Langton M.D.   On: 09/23/2014 09:28    ROS : Please see HPI, otherwise  negative.  He does not walk, but is able to stand with a brace and transfer Blood pressure 112/96, pulse 95, temperature 98.4 F (36.9 C), temperature source Oral, resp. rate 16, height '5\' 9"'  (1.753 m), weight 72.802 kg (160 lb 8 oz), SpO2 94 %. Physical Exam General: pleasant, WD, WN white male who is laying in bed in NAD HEENT: head is normocephalic, atraumatic.  Sclera are slightly icteric.  PERRL.  Ears and nose without any masses or lesions.  Mouth is pink and moist Heart: regular, rate, and rhythm.  Normal s1,s2. No obvious murmurs, gallops, or rubs noted.  Palpable radial and pedal pulses bilaterally Lungs: CTAB, no wheezes, rhonchi, or rales noted.  Respiratory effort nonlabored Abd: soft, NT, ND, +BS, no masses or organomegaly, bilateral inguinal hernias MS: all 4 extremities are symmetrical with no cyanosis, clubbing, or edema; however, the left side is relatively flaccid from his prior CVA Skin: warm and dry with no masses, lesions, or rashes Psych: A&Ox3 with an appropriate affect.   Assessment/Plan: Gallstone pancreatitis with choledocholithiasis -GI needs to be consulted for ERCP given choledocholithiasis.  Once patient has ERCP, then we can proceed with a lap chole.   -no evidence for cholangitis at this time.  Likely does not need any abx therapy, except on call to procedures. -patient's symptoms are resolved and he is tolerating a regular diet.  Can continue this for now, unless his symptoms worsen.  He will likely need to be NPO after MN, but will defer that to GI. H/o CVA with left sided hemiplegia HTN -per primary  Jemia Fata E 09/23/2014 2:01 PM Pager: [phone]

## 2014-09-23 NOTE — Clinical Social Work Note (Signed)
Clinical Social Work Assessment  Patient Details  Name: Gregory Kerr MRN: [id number] Date of Birth: May 17, 1941  Date of referral:  09/23/14               Reason for consult:  Discharge Planning                Permission sought to share information with:  Facility Sport and exercise psychologist, Family Supports Permission granted to share information::  Yes, Verbal Permission Granted  Name::     Arts administrator::  Miquel Dunn  Relationship::  Sister  Sport and exercise psychologist Information:  On facesheet  Housing/Transportation Living arrangements for the past 2 months:  [place] of Information:  Patient Patient Interpreter Needed:  None Criminal Activity/Legal Involvement Pertinent to Current Situation/Hospitalization:  No - Comment as needed Significant Relationships:  Siblings Lives with:  Facility Resident Do you feel safe going back to the place where you live?  Yes Need for family participation in patient care:  Yes (Comment) (Patient is able to make decisions but would like sister to be updated on his discharge.)  Care giving concerns:  Patient is happy with his home at [facility] and plans to return at discharge.   Social Worker assessment / plan:  CSW met with patient at bedside. Patient was admitted from [facility] and plans to return at discharge. Patient requests that his sister be updated on his discharge when appropriate. CSW explained CSW role and answered patient's questions.  Employment status:  Disabled (Comment on whether or not currently receiving Disability) Insurance information:  Medicare, Catering manager PT Recommendations:  [place] / Referral to community resources:  [place]  Patient/Family's Response to care:  Patient is happy with the care he is currently receiving in the hospital. He is happy to return to [facility] where he has lived for 5+ years.  Patient/Family's Understanding of and Emotional  Response to Diagnosis, Current Treatment, and Prognosis:  Patient appears to have fair insight into reason for admission and the plan for treatment.  Emotional Assessment Appearance:  Appears older than stated age Attitude/Demeanor/Rapport:  Other (Appropriate) Affect (typically observed):  Accepting, Flat, Stable, Appropriate Orientation:  Oriented to Self, Oriented to Place, Oriented to  Time, Oriented to Situation Alcohol / Substance use:  Never Used Psych involvement (Current and /or in the community):  No (Comment)  Discharge Needs  Concerns to be addressed:  Discharge Planning Concerns Readmission within the last 30 days:  No Current discharge risk:  Physical Impairment Barriers to Discharge:  Continued Medical Work up  [employer] MSW, [place], [place], [phone]

## 2014-09-23 NOTE — Progress Notes (Signed)
Progress Note   Gregory Kerr MPN:[id number] DOB: 1940/06/01 DOA: 09/22/2014 PCP: Blanchie Serve, MD   Brief Narrative:   Gregory Kerr is an 74 y.o. male with PMH of diastolic CHF, prior stroke with pseudobulbar affect/left-sided hemiplegia who was admitted 09/22/14 with acute gallstone pancreatitis.  Assessment/Plan:   Principal Problem:   Pancreatitis, gallstone with choledocholithiasis and cholelithiasis with possible acute cholangitis  - MRCP performed which showed significant choledocholithiasis and cholecystitis. - General surgery consulted. ERCP recommended.   - The patient has been followed by Dr. Michail Sermon. Have requested GI consultation.  - No evidence of cholecystitis, currently on empiric aztreonam and Flagyl for possible cholangitis. - Lipase dramatically improved, LFTs still elevated.  Active Problems:   Hypokalemia - We'll add potassium to IV fluids.    CVA, old, hemiparesis/Pseudobulbar affect - Continue Aggrenox. Continue muscle relaxers for spasticity.    Essential hypertension - Hold Lasix and Lopressor for now.    Chronic diastolic heart failure - Currently euvolemic. Lasix currently on hold.    DVT Prophylaxis - SCDs ordered.  Code Status: Full. Family Communication: No family at bedside, declines my offer to call sister. Advised him to have the nurse page me if his sister arrives and would like to speak with me. Disposition Plan: Back to [name], likely after cholecystectomy, in 3-4 days.   IV Access:    Peripheral IV   Procedures and diagnostic studies:   US Abdomen Complete  09/22/2014   CLINICAL DATA:  Abdominal pain.  Patient has not been NPO.  EXAM: COMPLETE ABDOMINAL ULTRASOUND  COMPARISON:  CT 04/11/2012  FINDINGS: Gallbladder: Layering sludge with some small echogenic subcentimeter calculi. Borderline gallbladder wall thickening possibly secondary date incomplete distention. No pericholecystic fluid. Sonographer reports no  sonographic Murphy's sign.  Common bile duct:  Normal in caliber, 45mm diameter.  Liver: Homogeneous in echotexture without focal lesion or intrahepatic bile duct dilatation.  IVC:  Negative  Pancreas: Visualized segments unremarkable, portions obscured by overlying bowel gas.  Spleen:  No focal lesion, craniocaudal 7.8cm in length.  Right Kidney:  No mass or hydronephrosis, 11.1cm in length.  Left Kidney:  No lesion or hydronephrosis, 10.6cm in length.  Abdominal aorta: Visualized segments unremarkable, portions obscured by overlying bowel gas.  IMPRESSION: 1. Cholelithiasis without other ultrasound evidence of cholecystitis or biliary obstruction.   Electronically Signed   By: Lucrezia Europe M.D.   On: 09/22/2014 09:09   Ct Abdomen Pelvis W Contrast  09/22/2014   CLINICAL DATA:  Abdominal pain, left side abdominal pain, history of hiatal hernia  EXAM: CT ABDOMEN AND PELVIS WITH CONTRAST  TECHNIQUE: Multidetector CT imaging of the abdomen and pelvis was performed using the standard protocol following bolus administration of intravenous contrast.  CONTRAST:  39mL OMNIPAQUE IOHEXOL 300 MG/ML  SOLN  COMPARISON:  04/11/2012  FINDINGS: There is a large hiatal hernia measuring 10.5 x 8 cm with at least 2/3 rd of proximal stomach in left lower chest retrocardiac position. There is no evidence of acute gastric volvulus. No gastric outlet obstruction.  Mild hepatic fatty infiltration. No focal hepatic mass. Small gallstones are noted in gallbladder neck region the largest measures 6.5 mm. Borderline thickening of gallbladder wall up to 2.9 mm without evidence of pericholecystic fluid. Again noted mild pancreatic atrophy with fatty replacement. The spleen and adrenal glands are unremarkable. Again noted lobulated renal contour. No hydronephrosis or hydroureter. Cystic lesion along the uncinate process of the pancreas again noted measures 1.7 cm stable  in size in appearance from prior exam. In a small portal caval lymph node  measures 1.2 by 0.9 cm stable in size in appearance from prior exam.  Atherosclerotic calcifications of abdominal aorta and iliac arteries. No aortic aneurysm.  There is a cyst in midpole of the right kidney anterolateral aspect measures 1.2 cm. Delayed renal images shows bilateral renal symmetrical excretion. A cyst in upper pole of the right kidney posterior aspect measures 1 cm.  Bilateral visualized proximal ureter is unremarkable.  No small bowel obstruction. No ascites or free air. No adenopathy. Oral contrast material is noted in distal small bowel terminal ileum and right colon. There is no pericecal inflammation. The terminal ileum is unremarkable. The patient is status post appendectomy. There is some stool in distal left colon and sigmoid colon. No evidence of colonic obstruction.  There is a right inguinal scrotal canal hernia containing fat and a loop of terminal small bowel measures 4.4 cm without evidence of small bowel obstruction. There is a left inguinal scrotal canal hernia containing fat measures 3.5 cm without evidence of acute complication. Prostate gland calcifications are noted. Mild enlarged prostate gland measures 4.9 by 3.5 cm. The urinary bladder is unremarkable.  IMPRESSION: 1. Again noted large hiatal hernia with at least 2/3 of proximal stomach in left lower chest retrocardiac position. There is no evidence of gastric volvulus or gastric outlet obstruction. 2. No small bowel or colonic obstruction. 3. Again noted gallstones within gallbladder. Borderline thickening of gallbladder wall up to 2.9 mm. No evidence of pericholecystic fluid. 4. Again noted atrophic pancreas with partial fatty replacement. Stable cystic lesion along the uncinate process of the pancreas. 5. No small bowel obstruction. Again noted right inguinal scrotal hernia containing small bowel without evidence of acute complication or small bowel obstruction. Again noted a left inguinal scrotal canal hernia containing fat  without evidence of acute complication. 6. Again noted mild enlarged prostate gland. 7. Mild hepatic fatty infiltration. 8. No hydronephrosis or hydroureter.  Right renal cysts are noted   Electronically Signed   By: Lahoma Crocker M.D.   On: 09/22/2014 09:28   Dg Chest Port 1 View  09/22/2014   CLINICAL DATA:  Dyspnea  EXAM: PORTABLE CHEST - 1 VIEW  COMPARISON:  04/03/2014  FINDINGS: There is a large hiatal hernia. There is interstitial fluid or thickening a there is mild vascular fullness. This likely represents congestive heart failure. No airspace consolidation is evident. There is no large effusion.  IMPRESSION: Congestive heart failure   Electronically Signed   By: Andreas Newport M.D.   On: 09/22/2014 06:13   Mr Jeananne Rama W/wo Cm/mrcp  09/23/2014   CLINICAL DATA:  74 year old male with mid epigastric pain radiating to his back.  EXAM: MRI ABDOMEN WITHOUT AND WITH CONTRAST (INCLUDING MRCP)  TECHNIQUE: Multiplanar multisequence MR imaging of the abdomen was performed both before and after the administration of intravenous contrast. Heavily T2-weighted images of the biliary and pancreatic ducts were obtained, and three-dimensional MRCP images were rendered by post processing.  CONTRAST:  29mL MULTIHANCE GADOBENATE DIMEGLUMINE 529 MG/ML IV SOLN  COMPARISON:  CT the abdomen and pelvis 09/22/2014.  FINDINGS: Comment: Poor image quality throughout the examination secondary to extensive patient respiratory motion.  Lower chest:  Moderate to large hiatal hernia.  Hepatobiliary: Several tiny filling defects within the gallbladder, compatible with gallstones. Gallbladder does not appear distended, gallbladder wall thickness is normal, there is no pericholecystic fluid to suggest acute cholecystitis at this time. However, there several  small filling defects within the mid to distal common bile duct, compatible with ductal stones. This is best appreciated on the coronal T2 weighted sequence on images 23 and 24 of series 4. At  this time, these stones appeared to be nonobstructive, as the common bile duct measures up to 6-7 mm (within normal limits for the patient's age), and there is no significant intrahepatic biliary ductal dilatation. No discrete cystic or solid hepatic lesions.  Pancreas: Well-defined 11 x 15 mm T1 hypointense, T2 hyperintense, nonenhancing lesion in the uncinate process of the pancreas, similar to slightly smaller than prior studies dating back to at least 04/11/2012, presumably a benign lesion such as a small pancreatic pseudocyst. A similar appearing 1 cm lesion in the tail of the pancreas is also noted, similar to recent prior study 09/22/2014, but cannot be confirmed on more remote prior examinations secondary to lack of IV contrast. No pancreatic ductal dilatation. No pancreatic or peripancreatic inflammatory changes.  Spleen: Unremarkable.  Adrenals/Urinary Tract: There are small lesions in the kidneys bilaterally which are T1 hypointense, T2 hyperintense, and do not appear to enhance (although accurate assessment is limited by extensive patient motion), favored to represent tiny cysts. Bilateral adrenal glands are normal in appearance.  Stomach/Bowel: Moderate to large hiatal hernia. Otherwise, unremarkable.  Vascular/Lymphatic: No aneurysm identified in the visualized abdominal vasculature. No lymphadenopathy.  Other: No significant volume of ascites in the visualized portions of the peritoneal cavity.  Musculoskeletal: No aggressive appearing osseous lesions are noted in the visualized skeleton.  IMPRESSION: 1. Choledocholithiasis and cholelithiasis, without signs of frank biliary tract obstruction or acute cholecystitis at this time. Surgical consultation is recommended. 2. 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 There is also a 1 cm simple  appearing cystic lesion in the tail of the pancreas, also favored to be a benign lesion, however, this cannot be confirmed on remote prior examinations. Repeat MRI of the abdomen with and without IV gadolinium in 1 year is recommended to ensure continued stability of this lesion. This recommendation follows ACR consensus guidelines: Managing Incidental Findings on Abdominal CT: White Paper of the ACR Incidental Findings Committee. J Am Coll Radiol 2010;7:754-773. 3. At the time of follow-up abdominal MRI, attention should be directed to the small cystic lesions in the kidneys to ensure their stability is well. These are favored to represent simple cysts, however, today's study was limited by considerable respiratory motion. The patient should be counseled prior to the followup examination to maintain strict breath holding during the examination to ensure better image quality. 4. Moderate to large hiatal hernia.   Electronically Signed   By: Vinnie Langton M.D.   On: 09/23/2014 09:28     Medical Consultants:    None.  Anti-Infectives:    Aztreonam 09/22/14--->  Flagyl 09/22/14--->  Subjective:   Gregory Kerr reports that his abdominal pain has gradually subsided, that he does not have any N/V, and was able to eat breakfast OK.  Objective:    Filed Vitals:   09/22/14 1400 09/22/14 1430 09/22/14 1545 09/22/14 2232  BP: 131/66 134/72 125/84 112/96  Pulse: 76 79 78 95  Temp:   99 F (37.2 C) 98.4 F (36.9 C)  TempSrc:   Oral Oral  Resp: 20 17  16   Height:   5\' 9"  (1.753 m)   Weight:  72.802 kg (160 lb 8 oz)   SpO2: 96% 94% 96% 94%    Intake/Output Summary (Last 24 hours) at 09/23/14 1157 Last data filed at 09/23/14 1034  Gross per 24 hour  Intake      0 ml  Output    800 ml  Net   -800 ml    Exam: Gen:  NAD Cardiovascular:  RRR, No M/R/G Respiratory:  Lungs CTAB Gastrointestinal:  Abdomen soft, NT/ND, + BS Extremities:  No C/E/C   Data Reviewed:    Labs: Basic  Metabolic Panel:  Recent Labs Lab 09/22/14 0543 09/22/14 1248 09/23/14 0605  NA 139 136 137  K 3.7 3.7 3.4*  CL 106 105 107  CO2 20* 21* 19*  GLUCOSE 118* 105* 119*  BUN 16 14 9   CREATININE 0.80 0.86 0.68  CALCIUM 8.9 8.3* 8.5*   GFR Estimated Creatinine Clearance: 84.7 mL/min (by C-G formula based on Cr of 0.68). Liver Function Tests:  Recent Labs Lab 09/22/14 0543 09/22/14 1248 09/23/14 0605  AST 103* 94* 56*  ALT 142* 129* 101*  ALKPHOS 475* 420* 397*  BILITOT 3.3* 4.0* 5.6*  PROT 7.4 6.8 6.5  ALBUMIN 3.2* 2.8* 2.6*    Recent Labs Lab 09/22/14 0543 09/23/14 0605  LIPASE 208* 18*   CBC:  Recent Labs Lab 09/22/14 0543 09/23/14 0605  WBC 12.1* 6.4  NEUTROABS 10.7*  --   HGB 14.3 13.3  HCT 42.3 39.8  MCV 87.0 87.3  PLT 216 206   Cardiac Enzymes:  Recent Labs Lab 09/22/14 0543  TROPONINI <0.03   BNP (last 3 results)  Recent Labs  04/03/14 0742  PROBNP 205.3*   Sepsis Labs:  Recent Labs Lab 09/22/14 0543 09/22/14 1000 09/22/14 1010 09/22/14 1248 09/22/14 1257 09/23/14 0605  WBC 12.1*  --   --   --   --  6.4  LATICACIDVEN  --  1.3 1.17 1.2 1.19  --    Microbiology No results found for this or any previous visit (from the past 240 hour(s)).   Medications:   . aztreonam  1 g Intravenous Q8H  . Dextromethorphan-Quinidine  1 capsule Oral Daily  . dipyridamole-aspirin  1 capsule Oral BID  . metronidazole  500 mg Intravenous Q8H   Continuous Infusions: . dextrose 5 % and 0.9% NaCl 50 mL/hr at 09/22/14 1600    Time spent: 35 minutes.  The patient is medically complex and requires high complexity decision making and coordination of care with multiple specialists.    LOS: 1 day   RAMA,CHRISTINA  [facility] Pager [phone]. If unable to reach me by pager, please call my cell phone at [phone].  *Please refer to [email or web address], password [email or web address] to get updated schedule on who will round on this patient, as hospitalists switch teams  weekly. If 7PM-7AM, please contact night-coverage at [email or web address], password [email or web address] for any overnight needs.  09/23/2014, 11:57 AM

## 2014-09-23 NOTE — Care Management Note (Signed)
Case Management Note  Patient Details  Name: Gregory Kerr MRN: [id number] Date of Birth: [birth year]  Subjective/Objective:                 Patient is from [facility], [place] referral.   Action/Plan:  Plan is to return back to [facility].  Expected Discharge Date:  09/26/14               Expected Discharge Plan:  Skilled Nursing Facility  In-House Referral:  Clinical Social Work  Discharge planning Services  CM Consult  Post Acute Care Choice:    Choice offered to:     DME Arranged:    DME Agency:     HH Arranged:    [place] Agency:     Status of Service:  In process, will continue to follow  Medicare Important Message Given:  No Date Medicare IM Given:    Medicare IM give by:    Date Additional Medicare IM Given:    Additional Medicare Important Message give by:     If discussed at [place] of Stay Meetings, dates discussed:    Additional Comments:  Zenon Mayo, RN 09/23/2014, 3:10 PM

## 2014-09-23 NOTE — Consult Note (Addendum)
Reason for Consult: Positive CBD stones Referring Physician: Hospital team  Gregory Kerr is an 74 y.o. male.  HPI: Patient seen and examined and discussed with his sister and the hospital computer chart was reviewed as well as our office computer chart and is had some dark urine for a while and has had some episodic midepigastric pain which radiates to his back as well as some reflux and nausea and a lot of this had been blamed on his hiatal hernia in the past and he does not remember being told he had gallstones until his December admission however it was discussed in the chart when he saw my partner in 2013 but this recent pain that brought him to the hospital was worse than usual and he had some nausea but no fever and no vomiting and is doing much better and tolerating regular diet and has no other complaints  Past Medical History  Diagnosis Date  . GERD (gastroesophageal reflux disease)   . Hypertension   . Hernia     "large one; on my left stomach" (09/22/2014)  . Hyperlipidemia   . HTN (hypertension) 10/24/2012  . Osteoporosis, unspecified 10/24/2012  . 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 Hiatal hernia 10/24/2012  . Pneumonia     "once or twice" (09/22/2014)  . Stroke 2007    "left side doesn't work now" (09/22/2014)    Past Surgical History  Procedure Laterality Date  . Appendectomy  05/27/2009    Ronnald Collum, MD  . Cataract extraction w/ intraocular lens  implant, bilateral Bilateral ?2014    Family History  Problem Relation Age of Onset  . Heart attack Mother   . Heart attack Father   . Cancer Maternal Aunt     Stomach  . Cancer Paternal Aunt     Uterine,Colon    Social History:  reports that he quit smoking about 8 years ago. His smoking use included Cigarettes. He smoked 0.50 packs per day. He has never used smokeless tobacco. He reports that he drinks alcohol. He reports that he does not use illicit drugs.  Allergies:  Allergies  Allergen  Reactions  . Penicillins Rash    Medications: I have reviewed the patient's current medications.  Results for orders placed or performed during the hospital encounter of 09/22/14 (from the past 48 hour(s))  CBC with Differential/Platelet     Status: Abnormal   Collection Time: 09/22/14  5:43 AM  Result Value Ref Range   WBC 12.1 (H) 4.0 - 10.5 K/uL   RBC 4.86 4.22 - 5.81 MIL/uL   Hemoglobin 14.3 13.0 - 17.0 g/dL   HCT 42.3 39.0 - 52.0 %   MCV 87.0 78.0 - 100.0 fL   MCH 29.4 26.0 - 34.0 pg   MCHC 33.8 30.0 - 36.0 g/dL   RDW 15.1 11.5 - 15.5 %   Platelets 216 150 - 400 K/uL   Neutrophils Relative % 88 (H) 43 - 77 %   Neutro Abs 10.7 (H) 1.7 - 7.7 K/uL   Lymphocytes Relative 5 (L) 12 - 46 %   Lymphs Abs 0.6 (L) 0.7 - 4.0 K/uL   Monocytes Relative 7 3 - 12 %   Monocytes Absolute 0.8 0.1 - 1.0 K/uL   Eosinophils Relative 0 0 - 5 %   Eosinophils Absolute 0.1 0.0 - 0.7 K/uL   Basophils Relative 0 0 - 1 %   Basophils Absolute 0.0 0.0 - 0.1 K/uL  Comprehensive metabolic panel  Status: Abnormal   Collection Time: 09/22/14  5:43 AM  Result Value Ref Range   Sodium 139 135 - 145 mmol/L   Potassium 3.7 3.5 - 5.1 mmol/L   Chloride 106 101 - 111 mmol/L   CO2 20 (L) 22 - 32 mmol/L   Glucose, Bld 118 (H) 70 - 99 mg/dL   BUN 16 6 - 20 mg/dL   Creatinine, Ser 0.80 0.61 - 1.24 mg/dL   Calcium 8.9 8.9 - 10.3 mg/dL   Total Protein 7.4 6.5 - 8.1 g/dL   Albumin 3.2 (L) 3.5 - 5.0 g/dL   AST 103 (H) 15 - 41 U/L   ALT 142 (H) 17 - 63 U/L   Alkaline Phosphatase 475 (H) 38 - 126 U/L   Total Bilirubin 3.3 (H) 0.3 - 1.2 mg/dL   GFR calc non Af Amer >60 >60 mL/min   GFR calc Af Amer >60 >60 mL/min    Comment: (NOTE) The eGFR has been calculated using the CKD EPI equation. This calculation has not been validated in all clinical situations. eGFR's persistently <90 mL/min signify possible Chronic Kidney Disease.    Anion gap 13 5 - 15  Troponin I     Status: None   Collection Time: 09/22/14   5:43 AM  Result Value Ref Range   Troponin I <0.03 <0.031 ng/mL    Comment:        NO INDICATION OF MYOCARDIAL INJURY.   Lipase, blood     Status: Abnormal   Collection Time: 09/22/14  5:43 AM  Result Value Ref Range   Lipase 208 (H) 22 - 51 U/L  Brain natriuretic peptide     Status: None   Collection Time: 09/22/14  5:43 AM  Result Value Ref Range   B Natriuretic Peptide 80.2 0.0 - 100.0 pg/mL  Urinalysis, Routine w reflex microscopic     Status: Abnormal   Collection Time: 09/22/14  7:26 AM  Result Value Ref Range   Color, Urine AMBER (A) YELLOW    Comment: BIOCHEMICALS MAY BE AFFECTED BY COLOR   APPearance CLEAR CLEAR   Specific Gravity, Urine 1.025 1.005 - 1.030   pH 6.0 5.0 - 8.0   Glucose, UA NEGATIVE NEGATIVE mg/dL   Hgb urine dipstick NEGATIVE NEGATIVE   Bilirubin Urine MODERATE (A) NEGATIVE   Ketones, ur 15 (A) NEGATIVE mg/dL   Protein, ur NEGATIVE NEGATIVE mg/dL   Urobilinogen, UA 1.0 0.0 - 1.0 mg/dL   Nitrite NEGATIVE NEGATIVE   Leukocytes, UA TRACE (A) NEGATIVE  Urine microscopic-add on     Status: Abnormal   Collection Time: 09/22/14  7:26 AM  Result Value Ref Range   Squamous Epithelial / LPF RARE RARE   WBC, UA 7-10 <3 WBC/hpf   RBC / HPF 3-6 <3 RBC/hpf   Bacteria, UA FEW (A) RARE   Crystals CA OXALATE CRYSTALS (A) NEGATIVE  Lactic acid, plasma     Status: None   Collection Time: 09/22/14 10:00 AM  Result Value Ref Range   Lactic Acid, Venous 1.3 0.5 - 2.0 mmol/L  I-Stat CG4 Lactic Acid, ED     Status: None   Collection Time: 09/22/14 10:10 AM  Result Value Ref Range   Lactic Acid, Venous 1.17 0.5 - 2.0 mmol/L  Lactic acid, plasma     Status: None   Collection Time: 09/22/14 12:48 PM  Result Value Ref Range   Lactic Acid, Venous 1.2 0.5 - 2.0 mmol/L  Comprehensive metabolic panel     Status:  Abnormal   Collection Time: 09/22/14 12:48 PM  Result Value Ref Range   Sodium 136 135 - 145 mmol/L   Potassium 3.7 3.5 - 5.1 mmol/L   Chloride 105 101 - 111  mmol/L   CO2 21 (L) 22 - 32 mmol/L   Glucose, Bld 105 (H) 70 - 99 mg/dL   BUN 14 6 - 20 mg/dL   Creatinine, Ser 0.86 0.61 - 1.24 mg/dL   Calcium 8.3 (L) 8.9 - 10.3 mg/dL   Total Protein 6.8 6.5 - 8.1 g/dL   Albumin 2.8 (L) 3.5 - 5.0 g/dL   AST 94 (H) 15 - 41 U/L   ALT 129 (H) 17 - 63 U/L   Alkaline Phosphatase 420 (H) 38 - 126 U/L   Total Bilirubin 4.0 (H) 0.3 - 1.2 mg/dL   GFR calc non Af Amer >60 >60 mL/min   GFR calc Af Amer >60 >60 mL/min    Comment: (NOTE) The eGFR has been calculated using the CKD EPI equation. This calculation has not been validated in all clinical situations. eGFR's persistently <90 mL/min signify possible Chronic Kidney Disease.    Anion gap 10 5 - 15  I-Stat CG4 Lactic Acid, ED     Status: None   Collection Time: 09/22/14 12:57 PM  Result Value Ref Range   Lactic Acid, Venous 1.19 0.5 - 2.0 mmol/L  Comprehensive metabolic panel     Status: Abnormal   Collection Time: 09/23/14  6:05 AM  Result Value Ref Range   Sodium 137 135 - 145 mmol/L   Potassium 3.4 (L) 3.5 - 5.1 mmol/L   Chloride 107 101 - 111 mmol/L   CO2 19 (L) 22 - 32 mmol/L   Glucose, Bld 119 (H) 70 - 99 mg/dL   BUN 9 6 - 20 mg/dL   Creatinine, Ser 0.68 0.61 - 1.24 mg/dL   Calcium 8.5 (L) 8.9 - 10.3 mg/dL   Total Protein 6.5 6.5 - 8.1 g/dL   Albumin 2.6 (L) 3.5 - 5.0 g/dL   AST 56 (H) 15 - 41 U/L   ALT 101 (H) 17 - 63 U/L   Alkaline Phosphatase 397 (H) 38 - 126 U/L   Total Bilirubin 5.6 (H) 0.3 - 1.2 mg/dL   GFR calc non Af Amer >60 >60 mL/min   GFR calc Af Amer >60 >60 mL/min    Comment: (NOTE) The eGFR has been calculated using the CKD EPI equation. This calculation has not been validated in all clinical situations. eGFR's persistently <90 mL/min signify possible Chronic Kidney Disease.    Anion gap 11 5 - 15  CBC     Status: None   Collection Time: 09/23/14  6:05 AM  Result Value Ref Range   WBC 6.4 4.0 - 10.5 K/uL   RBC 4.56 4.22 - 5.81 MIL/uL   Hemoglobin 13.3 13.0 -  17.0 g/dL   HCT 39.8 39.0 - 52.0 %   MCV 87.3 78.0 - 100.0 fL   MCH 29.2 26.0 - 34.0 pg   MCHC 33.4 30.0 - 36.0 g/dL   RDW 15.5 11.5 - 15.5 %   Platelets 206 150 - 400 K/uL  Lipase, blood     Status: Abnormal   Collection Time: 09/23/14  6:05 AM  Result Value Ref Range   Lipase 18 (L) 22 - 51 U/L    US Abdomen Complete  09/22/2014   CLINICAL DATA:  Abdominal pain.  Patient has not been NPO.  EXAM: COMPLETE ABDOMINAL ULTRASOUND  COMPARISON:  CT 04/11/2012  FINDINGS: Gallbladder: Layering sludge with some small echogenic subcentimeter calculi. Borderline gallbladder wall thickening possibly secondary date incomplete distention. No pericholecystic fluid. Sonographer reports no sonographic Murphy's sign.  Common bile duct:  Normal in caliber, 56mm diameter.  Liver: Homogeneous in echotexture without focal lesion or intrahepatic bile duct dilatation.  IVC:  Negative  Pancreas: Visualized segments unremarkable, portions obscured by overlying bowel gas.  Spleen:  No focal lesion, craniocaudal 7.8cm in length.  Right Kidney:  No mass or hydronephrosis, 11.1cm in length.  Left Kidney:  No lesion or hydronephrosis, 10.6cm in length.  Abdominal aorta: Visualized segments unremarkable, portions obscured by overlying bowel gas.  IMPRESSION: 1. Cholelithiasis without other ultrasound evidence of cholecystitis or biliary obstruction.   Electronically Signed   By: Lucrezia Europe M.D.   On: 09/22/2014 09:09   Ct Abdomen Pelvis W Contrast  09/22/2014   CLINICAL DATA:  Abdominal pain, left side abdominal pain, history of hiatal hernia  EXAM: CT ABDOMEN AND PELVIS WITH CONTRAST  TECHNIQUE: Multidetector CT imaging of the abdomen and pelvis was performed using the standard protocol following bolus administration of intravenous contrast.  CONTRAST:  68mL OMNIPAQUE IOHEXOL 300 MG/ML  SOLN  COMPARISON:  04/11/2012  FINDINGS: There is a large hiatal hernia measuring 10.5 x 8 cm with at least 2/3 rd of proximal stomach in left  lower chest retrocardiac position. There is no evidence of acute gastric volvulus. No gastric outlet obstruction.  Mild hepatic fatty infiltration. No focal hepatic mass. Small gallstones are noted in gallbladder neck region the largest measures 6.5 mm. Borderline thickening of gallbladder wall up to 2.9 mm without evidence of pericholecystic fluid. Again noted mild pancreatic atrophy with fatty replacement. The spleen and adrenal glands are unremarkable. Again noted lobulated renal contour. No hydronephrosis or hydroureter. Cystic lesion along the uncinate process of the pancreas again noted measures 1.7 cm stable in size in appearance from prior exam. In a small portal caval lymph node measures 1.2 by 0.9 cm stable in size in appearance from prior exam.  Atherosclerotic calcifications of abdominal aorta and iliac arteries. No aortic aneurysm.  There is a cyst in midpole of the right kidney anterolateral aspect measures 1.2 cm. Delayed renal images shows bilateral renal symmetrical excretion. A cyst in upper pole of the right kidney posterior aspect measures 1 cm.  Bilateral visualized proximal ureter is unremarkable.  No small bowel obstruction. No ascites or free air. No adenopathy. Oral contrast material is noted in distal small bowel terminal ileum and right colon. There is no pericecal inflammation. The terminal ileum is unremarkable. The patient is status post appendectomy. There is some stool in distal left colon and sigmoid colon. No evidence of colonic obstruction.  There is a right inguinal scrotal canal hernia containing fat and a loop of terminal small bowel measures 4.4 cm without evidence of small bowel obstruction. There is a left inguinal scrotal canal hernia containing fat measures 3.5 cm without evidence of acute complication. Prostate gland calcifications are noted. Mild enlarged prostate gland measures 4.9 by 3.5 cm. The urinary bladder is unremarkable.  IMPRESSION: 1. Again noted large hiatal  hernia with at least 2/3 of proximal stomach in left lower chest retrocardiac position. There is no evidence of gastric volvulus or gastric outlet obstruction. 2. No small bowel or colonic obstruction. 3. Again noted gallstones within gallbladder. Borderline thickening of gallbladder wall up to 2.9 mm. No evidence of pericholecystic fluid. 4. Again noted atrophic pancreas with partial fatty replacement.  Stable cystic lesion along the uncinate process of the pancreas. 5. No small bowel obstruction. Again noted right inguinal scrotal hernia containing small bowel without evidence of acute complication or small bowel obstruction. Again noted a left inguinal scrotal canal hernia containing fat without evidence of acute complication. 6. Again noted mild enlarged prostate gland. 7. Mild hepatic fatty infiltration. 8. No hydronephrosis or hydroureter.  Right renal cysts are noted   Electronically Signed   By: Lahoma Crocker M.D.   On: 09/22/2014 09:28   Dg Chest Port 1 View  09/22/2014   CLINICAL DATA:  Dyspnea  EXAM: PORTABLE CHEST - 1 VIEW  COMPARISON:  04/03/2014  FINDINGS: There is a large hiatal hernia. There is interstitial fluid or thickening a there is mild vascular fullness. This likely represents congestive heart failure. No airspace consolidation is evident. There is no large effusion.  IMPRESSION: Congestive heart failure   Electronically Signed   By: Andreas Newport M.D.   On: 09/22/2014 06:13   Mr Jeananne Rama W/wo Cm/mrcp  09/23/2014   CLINICAL DATA:  74 year old male with mid epigastric pain radiating to his back.  EXAM: MRI ABDOMEN WITHOUT AND WITH CONTRAST (INCLUDING MRCP)  TECHNIQUE: Multiplanar multisequence MR imaging of the abdomen was performed both before and after the administration of intravenous contrast. Heavily T2-weighted images of the biliary and pancreatic ducts were obtained, and three-dimensional MRCP images were rendered by post processing.  CONTRAST:  45mL MULTIHANCE GADOBENATE DIMEGLUMINE 529  MG/ML IV SOLN  COMPARISON:  CT the abdomen and pelvis 09/22/2014.  FINDINGS: Comment: Poor image quality throughout the examination secondary to extensive patient respiratory motion.  Lower chest:  Moderate to large hiatal hernia.  Hepatobiliary: Several tiny filling defects within the gallbladder, compatible with gallstones. Gallbladder does not appear distended, gallbladder wall thickness is normal, there is no pericholecystic fluid to suggest acute cholecystitis at this time. However, there several small filling defects within the mid to distal common bile duct, compatible with ductal stones. This is best appreciated on the coronal T2 weighted sequence on images 23 and 24 of series 4. At this time, these stones appeared to be nonobstructive, as the common bile duct measures up to 6-7 mm (within normal limits for the patient's age), and there is no significant intrahepatic biliary ductal dilatation. No discrete cystic or solid hepatic lesions.  Pancreas: Well-defined 11 x 15 mm T1 hypointense, T2 hyperintense, nonenhancing lesion in the uncinate process of the pancreas, similar to slightly smaller than prior studies dating back to at least 04/11/2012, presumably a benign lesion such as a small pancreatic pseudocyst. A similar appearing 1 cm lesion in the tail of the pancreas is also noted, similar to recent prior study 09/22/2014, but cannot be confirmed on more remote prior examinations secondary to lack of IV contrast. No pancreatic ductal dilatation. No pancreatic or peripancreatic inflammatory changes.  Spleen: Unremarkable.  Adrenals/Urinary Tract: There are small lesions in the kidneys bilaterally which are T1 hypointense, T2 hyperintense, and do not appear to enhance (although accurate assessment is limited by extensive patient motion), favored to represent tiny cysts. Bilateral adrenal glands are normal in appearance.  Stomach/Bowel: Moderate to large hiatal hernia. Otherwise, unremarkable.   Vascular/Lymphatic: No aneurysm identified in the visualized abdominal vasculature. No lymphadenopathy.  Other: No significant volume of ascites in the visualized portions of the peritoneal cavity.  Musculoskeletal: No aggressive appearing osseous lesions are noted in the visualized skeleton.  IMPRESSION: 1. Choledocholithiasis and cholelithiasis, without signs of frank biliary tract obstruction or  acute cholecystitis at this time. Surgical consultation is recommended. 2. 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 There is also a 1 cm simple appearing cystic lesion in the tail of the pancreas, also favored to be a benign lesion, however, this cannot be confirmed on remote prior examinations. Repeat MRI of the abdomen with and without IV gadolinium in 1 year is recommended to ensure continued stability of this lesion. This recommendation follows ACR consensus guidelines: Managing Incidental Findings on Abdominal CT: White Paper of the ACR Incidental Findings Committee. J Am Coll Radiol 2010;7:754-773. 3. At the time of follow-up abdominal MRI, attention should be directed to the small cystic lesions in the kidneys to ensure their stability is well. These are favored to represent simple cysts, however, today's study was limited by considerable respiratory motion. The patient should be counseled prior to the followup examination to maintain strict breath holding during the examination to ensure better image quality. 4. Moderate to large hiatal hernia.   Electronically Signed   By: Vinnie Langton M.D.   On: 09/23/2014 09:28    ROS negative except above Blood pressure 142/77, pulse 102, temperature 98.4 F (36.9 C), temperature source Oral, resp. rate 16, height _0  (1.753 m), weight 72.802 kg (160 lb 8 oz), SpO2 97 %. Physical Exam vital signs  stable afebrile no acute distress exam pertinent for his abdomen being soft nontender MRCP reviewed as well as lab work and CT and ultrasound Assessment/Plan: Patient with gallstones and CBD stones with multiple medical problems Plan: The risks benefits methods and success rate of ERCP with large hiatal hernias was discussed with the patient and his sister including sphincterotomy and possible stent placement and unfortunately I was unable to get him on the schedule for Thursday due to anesthesia not being available except for add-on emergencies and I have gone ahead and scheduled him for Friday at 7:30 however I will call the endoscopy unit at 8 AM tomorrow to see if there are any obvious cancellations and we will hold the Aggrenox Thursday p.m. and Friday a.m. if okay with the medical team and agree with surgery after ERCP and agree with surgery team that he does not obviously need antibiotics at this time  [facility] E 09/23/2014, 5:01 PM

## 2014-09-24 LAB — COMPREHENSIVE METABOLIC PANEL
ALBUMIN: 2.6 g/dL — AB (ref 3.5–5.0)
ALK PHOS: 365 U/L — AB (ref 38–126)
ALT: 75 U/L — AB (ref 17–63)
AST: 38 U/L (ref 15–41)
Anion gap: 9 (ref 5–15)
BUN: 9 mg/dL (ref 6–20)
CHLORIDE: 110 mmol/L (ref 101–111)
CO2: 19 mmol/L — AB (ref 22–32)
Calcium: 8.5 mg/dL — ABNORMAL LOW (ref 8.9–10.3)
Creatinine, Ser: 0.63 mg/dL (ref 0.61–1.24)
GFR calc Af Amer: >60 mL/min (ref >60)
GFR calc non Af Amer: >60 mL/min (ref >60)
GLUCOSE: 112 mg/dL — AB (ref 70–99)
POTASSIUM: 4.1 mmol/L (ref 3.5–5.1)
Sodium: 138 mmol/L (ref 135–145)
Total Bilirubin: 1.7 mg/dL — ABNORMAL HIGH (ref 0.3–1.2)
Total Protein: 6.1 g/dL — ABNORMAL LOW (ref 6.5–8.1)

## 2014-09-24 MED ORDER — SODIUM CHLORIDE 0.9 % IV SOLN
INTRAVENOUS | Status: DC
  Administered 2014-09-24: 20 mL/h via INTRAVENOUS

## 2014-09-24 MED ORDER — BACLOFEN 10 MG PO TABS
10.0000 mg | ORAL_TABLET | Freq: Once | ORAL | Status: AC
  Administered 2014-09-24: 10 mg via ORAL
  Filled 2014-09-24: qty 1

## 2014-09-24 MED ORDER — HYDROMORPHONE HCL 1 MG/ML IJ SOLN
0.5000 mg | INTRAMUSCULAR | Status: DC | PRN
  Administered 2014-09-26 – 2014-09-27 (×3): 0.5 mg via INTRAVENOUS
  Filled 2014-09-24 (×3): qty 1

## 2014-09-24 NOTE — Progress Notes (Signed)
Patient ID: Gregory Kerr, male   DOB: April 22, 1941, 74 y.o.   MRN: [id number]    Subjective: Pt feels well today.  Sitting up in chair with no pain  Objective: Vital signs in last 24 hours: Temp:  [98.2 F (36.8 C)-98.4 F (36.9 C)] 98.2 F (36.8 C) (05/05 0528) Pulse Rate:  [94-102] 94 (05/05 0528) Resp:  [16-18] 18 (05/05 0528) BP: (139-160)/(77-89) 139/89 mmHg (05/05 0528) SpO2:  [93 %-97 %] 97 % (05/05 0528) Weight:  [72.666 kg (160 lb 3.2 oz)] 72.666 kg (160 lb 3.2 oz) (05/05 0528) Last BM Date: 09/21/14  Intake/Output from previous day: 05/04 0701 - 05/05 0700 In: 1092.5 [P.O.:200; I.V.:892.5] Out: 1300 [Urine:1300] Intake/Output this shift: Total I/O In: -  Out: 300 [Urine:300]  PE: Abd: soft, Nt, Nd, +BS  Lab Results:   Recent Labs  09/22/14 0543 09/23/14 0605  WBC 12.1* 6.4  HGB 14.3 13.3  HCT 42.3 39.8  PLT 216 206   BMET  Recent Labs  09/23/14 0605 09/24/14 0611  NA 137 138  K 3.4* 4.1  CL 107 110  CO2 19* 19*  GLUCOSE 119* 112*  BUN 9 9  CREATININE 0.68 0.63  CALCIUM 8.5* 8.5*   PT/INR No results for input(s): LABPROT, INR in the last 72 hours. CMP     Component Value Date/Time   NA 138 09/24/2014 0611   NA 139 02/03/2014   K 4.1 09/24/2014 0611   CL 110 09/24/2014 0611   CO2 19* 09/24/2014 0611   GLUCOSE 112* 09/24/2014 0611   BUN 9 09/24/2014 0611   BUN 18 02/03/2014   CREATININE 0.63 09/24/2014 0611   CREATININE 0.8 02/03/2014   CALCIUM 8.5* 09/24/2014 0611   PROT 6.1* 09/24/2014 0611   ALBUMIN 2.6* 09/24/2014 0611   AST 38 09/24/2014 0611   ALT 75* 09/24/2014 0611   ALKPHOS 365* 09/24/2014 0611   BILITOT 1.7* 09/24/2014 0611   GFRNONAA >60 09/24/2014 0611   GFRAA >60 09/24/2014 0611   Lipase     Component Value Date/Time   LIPASE 18* 09/23/2014 0605       Studies/Results: Mr Abd W/wo Cm/mrcp  09/23/2014   CLINICAL DATA:  74 year old male with mid epigastric pain radiating to his back.  EXAM: MRI ABDOMEN WITHOUT  AND WITH CONTRAST (INCLUDING MRCP)  TECHNIQUE: Multiplanar multisequence MR imaging of the abdomen was performed both before and after the administration of intravenous contrast. Heavily T2-weighted images of the biliary and pancreatic ducts were obtained, and three-dimensional MRCP images were rendered by post processing.  CONTRAST:  28mL MULTIHANCE GADOBENATE DIMEGLUMINE 529 MG/ML IV SOLN  COMPARISON:  CT the abdomen and pelvis 09/22/2014.  FINDINGS: Comment: Poor image quality throughout the examination secondary to extensive patient respiratory motion.  Lower chest:  Moderate to large hiatal hernia.  Hepatobiliary: Several tiny filling defects within the gallbladder, compatible with gallstones. Gallbladder does not appear distended, gallbladder wall thickness is normal, there is no pericholecystic fluid to suggest acute cholecystitis at this time. However, there several small filling defects within the mid to distal common bile duct, compatible with ductal stones. This is best appreciated on the coronal T2 weighted sequence on images 23 and 24 of series 4. At this time, these stones appeared to be nonobstructive, as the common bile duct measures up to 6-7 mm (within normal limits for the patient's age), and there is no significant intrahepatic biliary ductal dilatation. No discrete cystic or solid hepatic lesions.  Pancreas: Well-defined 11 x 15 mm  T1 hypointense, T2 hyperintense, nonenhancing lesion in the uncinate process of the pancreas, similar to slightly smaller than prior studies dating back to at least 04/11/2012, presumably a benign lesion such as a small pancreatic pseudocyst. A similar appearing 1 cm lesion in the tail of the pancreas is also noted, similar to recent prior study 09/22/2014, but cannot be confirmed on more remote prior examinations secondary to lack of IV contrast. No pancreatic ductal dilatation. No pancreatic or peripancreatic inflammatory changes.  Spleen: Unremarkable.   Adrenals/Urinary Tract: There are small lesions in the kidneys bilaterally which are T1 hypointense, T2 hyperintense, and do not appear to enhance (although accurate assessment is limited by extensive patient motion), favored to represent tiny cysts. Bilateral adrenal glands are normal in appearance.  Stomach/Bowel: Moderate to large hiatal hernia. Otherwise, unremarkable.  Vascular/Lymphatic: No aneurysm identified in the visualized abdominal vasculature. No lymphadenopathy.  Other: No significant volume of ascites in the visualized portions of the peritoneal cavity.  Musculoskeletal: No aggressive appearing osseous lesions are noted in the visualized skeleton.  IMPRESSION: 1. Choledocholithiasis and cholelithiasis, without signs of frank biliary tract obstruction or acute cholecystitis at this time. Surgical consultation is recommended. 2. 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 There is also a 1 cm simple appearing cystic lesion in the tail of the pancreas, also favored to be a benign lesion, however, this cannot be confirmed on remote prior examinations. Repeat MRI of the abdomen with and without IV gadolinium in 1 year is recommended to ensure continued stability of this lesion. This recommendation follows ACR consensus guidelines: Managing Incidental Findings on Abdominal CT: White Paper of the ACR Incidental Findings Committee. J Am Coll Radiol 2010;7:754-773. 3. At the time of follow-up abdominal MRI, attention should be directed to the small cystic lesions in the kidneys to ensure their stability is well. These are favored to represent simple cysts, however, today's study was limited by considerable respiratory motion. The patient should be counseled prior to the followup examination to maintain strict breath holding during the examination  to ensure better image quality. 4. Moderate to large hiatal hernia.   Electronically Signed   By: Vinnie Langton M.D.   On: 09/23/2014 09:28    Anti-infectives: Anti-infectives    Start     Dose/Rate Route Frequency Ordered Stop   09/22/14 1800  metroNIDAZOLE (FLAGYL) IVPB 500 mg  Status:  Discontinued     500 mg 100 mL/hr over 60 Minutes Intravenous Every 8 hours 09/22/14 1542 09/23/14 1709   09/22/14 1800  aztreonam (AZACTAM) 1 g in dextrose 5 % 50 mL IVPB  Status:  Discontinued     1 g 100 mL/hr over 30 Minutes Intravenous Every 8 hours 09/22/14 1322 09/23/14 1709   09/22/14 1000  ciprofloxacin (CIPRO) IVPB 400 mg     400 mg 200 mL/hr over 60 Minutes Intravenous  Once 09/22/14 0951 09/22/14 1154   09/22/14 1000  metroNIDAZOLE (FLAGYL) IVPB 500 mg     500 mg 100 mL/hr over 60 Minutes Intravenous  Once 09/22/14 0951 09/22/14 1143       Assessment/Plan  1. Gallstone pancreatitis/choledocholithiasis -TB fell to 1.7 today.  GI plans for ERCP tomorrow morning at 8. -on aggrenox.  Will hold this for at least 2 days prior to surgery.  If ERCP goes well tomorrow, will plan for OR on Saturday.  LOS: 2 days    Duc Crocket E 09/24/2014, 10:51 AM Pager: [phone]

## 2014-09-24 NOTE — Progress Notes (Signed)
Gregory Kerr 9:33 AM  Subjective: Patient doing fine tolerating regular food and no change from yesterday no new complaints unfortunately I called at 7 AM and anesthesia was not able to do the procedure today  Objective: Vital signs stable afebrile no acute distress lungs are clear heart regular rate and rhythm abdomen is soft nontender labs improved  Assessment: Gallstones and CBD stones in a patient with a large hiatal hernia  Plan: We rediscussed ERCP tomorrow at 7:30 AM and nothing after midnight and if it goes well without problems hopefully laparoscopic cholecystectomy this weekend  [facility] E  Pager [phone] After 5PM or if no answer call [phone]

## 2014-09-24 NOTE — Evaluation (Signed)
Physical Therapy Evaluation Patient Details Name: DADEN MAHANY MRN: [id number] DOB: Sep 10, 1940 Today's Date: 09/24/2014   History of Present Illness  Patient is a 74 y/o male who presents with abdominal pain. Admitted with Gallstone pancreatitis/choledocholithiasis. GI plans for ERCP tomorrow morning (5/5). Possible OR over the weekend. PMH of HTN, HLD, PNA, CVA with residual left sided deficits.    Clinical Impression  Patient presents with baseline left hemiplegia with increased tone and weakness/deconditioning from hospitalization impacting mobility. PTA, pt able to transfer Mod I to w/c. Pt is not functioning at baseline as now pt requires Max A to transfer to bed/chair. Dyspnea present during activity. Would benefit from return to [facility] to improve transfers and mobility so pt can maximize independence and return to PLOF.     Follow Up Recommendations SNF;Supervision/Assistance - 24 hour    Equipment Recommendations  None recommended by PT    Recommendations for Other Services       Precautions / Restrictions Precautions Precautions: Fall Precaution Comments: L hemiplegia, increased tone. Restrictions Weight Bearing Restrictions: No      Mobility  Bed Mobility               General bed mobility comments: Sitting in chair upon PT arrival.   Transfers Overall transfer level: Needs assistance   Transfers: Sit to/from Stand;Squat Pivot Transfers Sit to Stand: Max assist   Squat pivot transfers: Max assist     General transfer comment: Max A to rise from chair with cues for hand placement (pt used to pulling up on bedrail to stand). Stood from chair x4. Squat pivot transfer chair to/from bed x1 with Max A. Dyspnea present. Vitals stable.  Ambulation/Gait Ambulation/Gait assistance:  (Pt non ambulatory at baseline.)              Stairs            Wheelchair Mobility    Modified Rankin (Stroke Patients Only)       Balance Overall balance  assessment: Needs assistance Sitting-balance support: Feet supported;No upper extremity supported Sitting balance-Leahy Scale: Fair     Standing balance support: During functional activity Standing balance-Leahy Scale: Zero Standing balance comment: Not able to stand without support.                             Pertinent Vitals/Pain Pain Assessment: No/denies pain    Home Living Family/patient expects to be discharged to:: Skilled nursing facility                      Prior Function Level of Independence: Needs assistance   Gait / Transfers Assistance Needed: Pt uses w/c for mobility. Reports Mod I for transfers.  ADL's / Homemaking Assistance Needed: Reports MOd I for dressing/bathing.  Comments: Pt getting PT at [facility].     Hand Dominance   Dominant Hand: Right    Extremity/Trunk Assessment   Upper Extremity Assessment: LUE deficits/detail       LUE Deficits / Details: Flexor tone LUE.   Lower Extremity Assessment: LLE deficits/detail;Generalized weakness;RLE deficits/detail RLE Deficits / Details: Grossly ~3/5 throughout. LLE Deficits / Details: Extensor tone LLE. Able to break tone to increase knee/hip flexion. No AROM ankle. Wears AFO.     Communication   Communication: No difficulties  Cognition Arousal/Alertness: Awake/alert Behavior During Therapy: WFL for tasks assessed/performed Overall Cognitive Status: Within Functional Limits for tasks assessed  General Comments      Exercises        Assessment/Plan    PT Assessment Patient needs continued PT services  PT Diagnosis Hemiplegia non-dominant side;Generalized weakness   PT Problem List Decreased strength;Decreased range of motion;Cardiopulmonary status limiting activity;Decreased balance;Decreased activity tolerance;Decreased mobility;Impaired tone  PT Treatment Interventions Balance training;Neuromuscular re-education;Patient/family  education;Therapeutic activities;Therapeutic exercise;Functional mobility training   PT Goals (Current goals can be found in the Care Plan section) Acute Rehab PT Goals Patient Stated Goal: none stated PT Goal Formulation: With patient Time For Goal Achievement: 10/08/14 Potential to Achieve Goals: Fair    Frequency Min 2X/week   Barriers to discharge        Co-evaluation               End of Session Equipment Utilized During Treatment: Gait belt Activity Tolerance: Patient tolerated treatment well;Patient limited by fatigue Patient left: in chair;with call bell/phone within reach Nurse Communication: Mobility status         Time: 1357-1415 PT Time Calculation (min) (ACUTE ONLY): 18 min   Charges:   PT Evaluation $Initial PT Evaluation Tier I: 1 Procedure     PT G CodesCandy Sledge A 10/22/2014, 2:26 PM Wray Kearns, [place], DPT [phone]

## 2014-09-24 NOTE — Progress Notes (Signed)
Progress Note   HANEEF HALLQUIST JAS:[id number] DOB: 17-Jul-1940 DOA: 09/22/2014 PCP: Blanchie Serve, MD   Brief Narrative:   Gregory Kerr is an 74 y.o. male with PMH of diastolic CHF, prior stroke with pseudobulbar affect/left-sided hemiplegia who was admitted 09/22/14 with acute gallstone pancreatitis.  Assessment/Plan:   Principal Problem:   Pancreatitis, gallstone with choledocholithiasis and cholelithiasis with possible acute cholangitis  - MRCP performed which showed significant choledocholithiasis and cholecystitis. - General surgery consulted. ERCP recommended, which will be done 09/25/14.    - No evidence of cholecystitis, currently on empiric aztreonam and Flagyl for possible cholangitis. - Lipase dramatically improved, LFTs still elevated.  Active Problems:   Hypokalemia - Resolved with potassium added to IV fluids.    CVA, old, hemiparesis/Pseudobulbar affect - Continue Aggrenox. Continue muscle relaxers for spasticity.    Essential hypertension - Hold Lasix and Lopressor for now.    Chronic diastolic heart failure - Currently euvolemic. Lasix currently on hold.    DVT Prophylaxis - SCDs ordered.  Code Status: Full. Family Communication: No family at bedside, declines my offer to call sister. Advised him to have the nurse page me if his sister arrives and would like to speak with me. Disposition Plan: Back to [facility], likely after cholecystectomy, in 3-4 days.   IV Access:    Peripheral IV   Procedures and diagnostic studies:   US Abdomen Complete  09/22/2014   CLINICAL DATA:  Abdominal pain.  Patient has not been NPO.  EXAM: COMPLETE ABDOMINAL ULTRASOUND  COMPARISON:  CT 04/11/2012  FINDINGS: Gallbladder: Layering sludge with some small echogenic subcentimeter calculi. Borderline gallbladder wall thickening possibly secondary date incomplete distention. No pericholecystic fluid. Sonographer reports no sonographic Murphy's sign.  Common bile duct:   Normal in caliber, 44mm diameter.  Liver: Homogeneous in echotexture without focal lesion or intrahepatic bile duct dilatation.  IVC:  Negative  Pancreas: Visualized segments unremarkable, portions obscured by overlying bowel gas.  Spleen:  No focal lesion, craniocaudal 7.8cm in length.  Right Kidney:  No mass or hydronephrosis, 11.1cm in length.  Left Kidney:  No lesion or hydronephrosis, 10.6cm in length.  Abdominal aorta: Visualized segments unremarkable, portions obscured by overlying bowel gas.  IMPRESSION: 1. Cholelithiasis without other ultrasound evidence of cholecystitis or biliary obstruction.   Electronically Signed   By: Lucrezia Europe M.D.   On: 09/22/2014 09:09   Ct Abdomen Pelvis W Contrast  09/22/2014   CLINICAL DATA:  Abdominal pain, left side abdominal pain, history of hiatal hernia  EXAM: CT ABDOMEN AND PELVIS WITH CONTRAST  TECHNIQUE: Multidetector CT imaging of the abdomen and pelvis was performed using the standard protocol following bolus administration of intravenous contrast.  CONTRAST:  36mL OMNIPAQUE IOHEXOL 300 MG/ML  SOLN  COMPARISON:  04/11/2012  FINDINGS: There is a large hiatal hernia measuring 10.5 x 8 cm with at least 2/3 rd of proximal stomach in left lower chest retrocardiac position. There is no evidence of acute gastric volvulus. No gastric outlet obstruction.  Mild hepatic fatty infiltration. No focal hepatic mass. Small gallstones are noted in gallbladder neck region the largest measures 6.5 mm. Borderline thickening of gallbladder wall up to 2.9 mm without evidence of pericholecystic fluid. Again noted mild pancreatic atrophy with fatty replacement. The spleen and adrenal glands are unremarkable. Again noted lobulated renal contour. No hydronephrosis or hydroureter. Cystic lesion along the uncinate process of the pancreas again noted measures 1.7 cm stable in size in appearance from prior exam.  In a small portal caval lymph node measures 1.2 by 0.9 cm stable in size in  appearance from prior exam.  Atherosclerotic calcifications of abdominal aorta and iliac arteries. No aortic aneurysm.  There is a cyst in midpole of the right kidney anterolateral aspect measures 1.2 cm. Delayed renal images shows bilateral renal symmetrical excretion. A cyst in upper pole of the right kidney posterior aspect measures 1 cm.  Bilateral visualized proximal ureter is unremarkable.  No small bowel obstruction. No ascites or free air. No adenopathy. Oral contrast material is noted in distal small bowel terminal ileum and right colon. There is no pericecal inflammation. The terminal ileum is unremarkable. The patient is status post appendectomy. There is some stool in distal left colon and sigmoid colon. No evidence of colonic obstruction.  There is a right inguinal scrotal canal hernia containing fat and a loop of terminal small bowel measures 4.4 cm without evidence of small bowel obstruction. There is a left inguinal scrotal canal hernia containing fat measures 3.5 cm without evidence of acute complication. Prostate gland calcifications are noted. Mild enlarged prostate gland measures 4.9 by 3.5 cm. The urinary bladder is unremarkable.  IMPRESSION: 1. Again noted large hiatal hernia with at least 2/3 of proximal stomach in left lower chest retrocardiac position. There is no evidence of gastric volvulus or gastric outlet obstruction. 2. No small bowel or colonic obstruction. 3. Again noted gallstones within gallbladder. Borderline thickening of gallbladder wall up to 2.9 mm. No evidence of pericholecystic fluid. 4. Again noted atrophic pancreas with partial fatty replacement. Stable cystic lesion along the uncinate process of the pancreas. 5. No small bowel obstruction. Again noted right inguinal scrotal hernia containing small bowel without evidence of acute complication or small bowel obstruction. Again noted a left inguinal scrotal canal hernia containing fat without evidence of acute complication.  6. Again noted mild enlarged prostate gland. 7. Mild hepatic fatty infiltration. 8. No hydronephrosis or hydroureter.  Right renal cysts are noted   Electronically Signed   By: Lahoma Crocker M.D.   On: 09/22/2014 09:28   Dg Chest Port 1 View  09/22/2014   CLINICAL DATA:  Dyspnea  EXAM: PORTABLE CHEST - 1 VIEW  COMPARISON:  04/03/2014  FINDINGS: There is a large hiatal hernia. There is interstitial fluid or thickening a there is mild vascular fullness. This likely represents congestive heart failure. No airspace consolidation is evident. There is no large effusion.  IMPRESSION: Congestive heart failure   Electronically Signed   By: Andreas Newport M.D.   On: 09/22/2014 06:13   Mr Jeananne Valbona Slabach W/wo Cm/mrcp  09/23/2014   CLINICAL DATA:  74 year old male with mid epigastric pain radiating to his back.  EXAM: MRI ABDOMEN WITHOUT AND WITH CONTRAST (INCLUDING MRCP)  TECHNIQUE: Multiplanar multisequence MR imaging of the abdomen was performed both before and after the administration of intravenous contrast. Heavily T2-weighted images of the biliary and pancreatic ducts were obtained, and three-dimensional MRCP images were rendered by post processing.  CONTRAST:  38mL MULTIHANCE GADOBENATE DIMEGLUMINE 529 MG/ML IV SOLN  COMPARISON:  CT the abdomen and pelvis 09/22/2014.  FINDINGS: Comment: Poor image quality throughout the examination secondary to extensive patient respiratory motion.  Lower chest:  Moderate to large hiatal hernia.  Hepatobiliary: Several tiny filling defects within the gallbladder, compatible with gallstones. Gallbladder does not appear distended, gallbladder wall thickness is normal, there is no pericholecystic fluid to suggest acute cholecystitis at this time. However, there several small filling defects within the mid to  distal common bile duct, compatible with ductal stones. This is best appreciated on the coronal T2 weighted sequence on images 23 and 24 of series 4. At this time, these stones appeared to be  nonobstructive, as the common bile duct measures up to 6-7 mm (within normal limits for the patient's age), and there is no significant intrahepatic biliary ductal dilatation. No discrete cystic or solid hepatic lesions.  Pancreas: Well-defined 11 x 15 mm T1 hypointense, T2 hyperintense, nonenhancing lesion in the uncinate process of the pancreas, similar to slightly smaller than prior studies dating back to at least 04/11/2012, presumably a benign lesion such as a small pancreatic pseudocyst. A similar appearing 1 cm lesion in the tail of the pancreas is also noted, similar to recent prior study 09/22/2014, but cannot be confirmed on more remote prior examinations secondary to lack of IV contrast. No pancreatic ductal dilatation. No pancreatic or peripancreatic inflammatory changes.  Spleen: Unremarkable.  Adrenals/Urinary Tract: There are small lesions in the kidneys bilaterally which are T1 hypointense, T2 hyperintense, and do not appear to enhance (although accurate assessment is limited by extensive patient motion), favored to represent tiny cysts. Bilateral adrenal glands are normal in appearance.  Stomach/Bowel: Moderate to large hiatal hernia. Otherwise, unremarkable.  Vascular/Lymphatic: No aneurysm identified in the visualized abdominal vasculature. No lymphadenopathy.  Other: No significant volume of ascites in the visualized portions of the peritoneal cavity.  Musculoskeletal: No aggressive appearing osseous lesions are noted in the visualized skeleton.  IMPRESSION: 1. Choledocholithiasis and cholelithiasis, without signs of frank biliary tract obstruction or acute cholecystitis at this time. Surgical consultation is recommended. 2. 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 There is also a 1 cm simple appearing cystic lesion in the tail of  the pancreas, also favored to be a benign lesion, however, this cannot be confirmed on remote prior examinations. Repeat MRI of the abdomen with and without IV gadolinium in 1 year is recommended to ensure continued stability of this lesion. This recommendation follows ACR consensus guidelines: Managing Incidental Findings on Abdominal CT: White Paper of the ACR Incidental Findings Committee. J Am Coll Radiol 2010;7:754-773. 3. At the time of follow-up abdominal MRI, attention should be directed to the small cystic lesions in the kidneys to ensure their stability is well. These are favored to represent simple cysts, however, today's study was limited by considerable respiratory motion. The patient should be counseled prior to the followup examination to maintain strict breath holding during the examination to ensure better image quality. 4. Moderate to large hiatal hernia.   Electronically Signed   By: Vinnie Langton M.D.   On: 09/23/2014 09:28     Medical Consultants:    None.  Anti-Infectives:    Aztreonam 09/22/14--->  Flagyl 09/22/14--->  Subjective:   Heywood Bene denies abdominal pain. Appetite is good with no nausea or vomiting. No shortness of breath or cough.  Objective:    Filed Vitals:   09/23/14 1356 09/23/14 2344 09/24/14 0528 09/24/14 1419  BP: 142/77 160/87 139/89 148/78  Pulse: 102 97 94 107  Temp: 98.4 F (36.9 C) 98.3 F (36.8 C) 98.2 F (36.8 C) 98.1 F (36.7 C)  TempSrc: Oral Oral Oral Oral  Resp: 16 18 18    Height:      Weight:   72.666 kg (160 lb 3.2 oz)   SpO2:  97% 93% 97% 97%    Intake/Output Summary (Last 24 hours) at 09/24/14 1542 Last data filed at 09/24/14 1446  Gross per 24 hour  Intake 1012.5 ml  Output   1200 ml  Net -187.5 ml    Exam: Gen:  NAD Cardiovascular:  RRR, No M/R/G Respiratory:  Lungs CTAB Gastrointestinal:  Abdomen soft, NT/ND, + BS Extremities:  No C/E/C   Data Reviewed:    Labs: Basic Metabolic Panel:  Recent  Labs Lab 09/22/14 0543 09/22/14 1248 09/23/14 0605 09/24/14 0611  NA 139 136 137 138  K 3.7 3.7 3.4* 4.1  CL 106 105 107 110  CO2 20* 21* 19* 19*  GLUCOSE 118* 105* 119* 112*  BUN 16 14 9 9   CREATININE 0.80 0.86 0.68 0.63  CALCIUM 8.9 8.3* 8.5* 8.5*   GFR Estimated Creatinine Clearance: 84.7 mL/min (by C-G formula based on Cr of 0.63). Liver Function Tests:  Recent Labs Lab 09/22/14 0543 09/22/14 1248 09/23/14 0605 09/24/14 0611  AST 103* 94* 56* 38  ALT 142* 129* 101* 75*  ALKPHOS 475* 420* 397* 365*  BILITOT 3.3* 4.0* 5.6* 1.7*  PROT 7.4 6.8 6.5 6.1*  ALBUMIN 3.2* 2.8* 2.6* 2.6*    Recent Labs Lab 09/22/14 0543 09/23/14 0605  LIPASE 208* 18*   CBC:  Recent Labs Lab 09/22/14 0543 09/23/14 0605  WBC 12.1* 6.4  NEUTROABS 10.7*  --   HGB 14.3 13.3  HCT 42.3 39.8  MCV 87.0 87.3  PLT 216 206   Cardiac Enzymes:  Recent Labs Lab 09/22/14 0543  TROPONINI <0.03   BNP (last 3 results)  Recent Labs  04/03/14 0742  PROBNP 205.3*   Sepsis Labs:  Recent Labs Lab 09/22/14 0543 09/22/14 1000 09/22/14 1010 09/22/14 1248 09/22/14 1257 09/23/14 0605  WBC 12.1*  --   --   --   --  6.4  LATICACIDVEN  --  1.3 1.17 1.2 1.19  --    Microbiology No results found for this or any previous visit (from the past 240 hour(s)).   Medications:     Continuous Infusions: . 0.9 % NaCl with KCl 20 mEq / L 75 mL/hr at 09/24/14 1035    Time spent: 25 minutes.    LOS: 2 days   Adysson Revelle  [facility] Pager [phone]. If unable to reach me by pager, please call my cell phone at [phone].  *Please refer to [email or web address], password [email or web address] to get updated schedule on who will round on this patient, as hospitalists switch teams weekly. If 7PM-7AM, please contact night-coverage at [email or web address], password [email or web address] for any overnight needs.  09/24/2014, 3:42 PM

## 2014-09-24 NOTE — Progress Notes (Signed)
OT Cancellation Note  Patient Details Name: Gregory Kerr MRN: [id number] DOB: [birth year]   Cancelled Treatment:    Reason Eval/Treat Not Completed: Other (comment) Pt is /Medicaid and current D/C plan is SNF, where he has lived for 5+ years. No apparent immediate acute care OT needs, therefore will defer OT to SNF. If OT eval is needed please call Acute Rehab Dept. at [phone] or text page OT at [phone].   Cyndie Chime, OTR/L Occupational Therapist [phone] (pager)  09/24/14

## 2014-09-25 ENCOUNTER — Encounter (HOSPITAL_COMMUNITY)
Admission: EM | Disposition: A | Discharge status: Skilled Nursing Facility | Payer: Self-pay | Source: Home / Self Care | Attending: Internal Medicine

## 2014-09-25 ENCOUNTER — Inpatient Hospital Stay (HOSPITAL_COMMUNITY): Payer: Medicare Other | Admitting: Certified Registered"

## 2014-09-25 ENCOUNTER — Inpatient Hospital Stay (HOSPITAL_COMMUNITY): Payer: Medicare Other

## 2014-09-25 ENCOUNTER — Encounter (HOSPITAL_COMMUNITY): Payer: Self-pay

## 2014-09-25 HISTORY — PX: ERCP: SHX5425

## 2014-09-25 LAB — TYPE AND SCREEN
ABO/RH(D): A NEG
Antibody Screen: NEGATIVE

## 2014-09-25 LAB — ABO/RH: ABO/RH(D): A NEG

## 2014-09-25 LAB — MRSA PCR SCREENING: MRSA by PCR: NEGATIVE

## 2014-09-25 LAB — SURGICAL PCR SCREEN
MRSA, PCR: NEGATIVE
STAPHYLOCOCCUS AUREUS: NEGATIVE

## 2014-09-25 SURGERY — ERCP, WITH INTERVENTION IF INDICATED
Anesthesia: General

## 2014-09-25 MED ORDER — CIPROFLOXACIN IN D5W 400 MG/200ML IV SOLN
400.0000 mg | INTRAVENOUS | Status: AC
  Administered 2014-09-26 (×2): 400 mg via INTRAVENOUS
  Filled 2014-09-25: qty 200

## 2014-09-25 MED ORDER — PHENOL 1.4 % MT LIQD
1.0000 | OROMUCOSAL | Status: DC | PRN
  Administered 2014-09-25: 1 via OROMUCOSAL
  Filled 2014-09-25 (×3): qty 177

## 2014-09-25 MED ORDER — ARTIFICIAL TEARS OP OINT
TOPICAL_OINTMENT | OPHTHALMIC | Status: DC | PRN
Start: 2014-09-25 — End: 2014-09-25
  Administered 2014-09-25: 1 via OPHTHALMIC

## 2014-09-25 MED ORDER — FENTANYL CITRATE (PF) 100 MCG/2ML IJ SOLN
INTRAMUSCULAR | Status: DC | PRN
  Administered 2014-09-25: 100 ug via INTRAVENOUS
  Administered 2014-09-25: 50 ug via INTRAVENOUS

## 2014-09-25 MED ORDER — CIPROFLOXACIN IN D5W 400 MG/200ML IV SOLN
INTRAVENOUS | Status: AC
  Filled 2014-09-25: qty 200

## 2014-09-25 MED ORDER — GLUCAGON HCL RDNA (DIAGNOSTIC) 1 MG IJ SOLR
INTRAMUSCULAR | Status: AC
  Filled 2014-09-25: qty 2

## 2014-09-25 MED ORDER — LIDOCAINE HCL (CARDIAC) 20 MG/ML IV SOLN
INTRAVENOUS | Status: DC | PRN
  Administered 2014-09-25: 80 mg via INTRAVENOUS

## 2014-09-25 MED ORDER — CIPROFLOXACIN IN D5W 400 MG/200ML IV SOLN: 400.0000 mg | Freq: Two times a day (BID) | INTRAVENOUS | Status: DC

## 2014-09-25 MED ORDER — ONDANSETRON HCL 4 MG/2ML IJ SOLN
INTRAMUSCULAR | Status: DC | PRN
  Administered 2014-09-25: 4 mg via INTRAVENOUS

## 2014-09-25 MED ORDER — SUCCINYLCHOLINE CHLORIDE 20 MG/ML IJ SOLN
INTRAMUSCULAR | Status: DC | PRN
  Administered 2014-09-25: 100 mg via INTRAVENOUS

## 2014-09-25 MED ORDER — MENTHOL 3 MG MT LOZG
1.0000 | LOZENGE | OROMUCOSAL | Status: DC | PRN
  Filled 2014-09-25 (×3): qty 9

## 2014-09-25 MED ORDER — SODIUM CHLORIDE 0.9 % IV SOLN
INTRAVENOUS | Status: DC | PRN
  Administered 2014-09-25: 22 mL

## 2014-09-25 MED ORDER — LACTATED RINGERS IV SOLN
INTRAVENOUS | Status: DC | PRN
  Administered 2014-09-25: 08:00:00 via INTRAVENOUS

## 2014-09-25 MED ORDER — PROPOFOL 10 MG/ML IV BOLUS
INTRAVENOUS | Status: DC | PRN
  Administered 2014-09-25: 130 mg via INTRAVENOUS

## 2014-09-25 MED ORDER — PHENYLEPHRINE HCL 10 MG/ML IJ SOLN
INTRAMUSCULAR | Status: DC | PRN
  Administered 2014-09-25 (×3): 80 ug via INTRAVENOUS
  Administered 2014-09-25: 40 ug via INTRAVENOUS

## 2014-09-25 MED ORDER — CIPROFLOXACIN IN D5W 400 MG/200ML IV SOLN
400.0000 mg | Freq: Once | INTRAVENOUS | Status: AC
Start: 2014-09-25 — End: 2014-09-25
  Administered 2014-09-25: 400 mg via INTRAVENOUS

## 2014-09-25 NOTE — Progress Notes (Signed)
Progress Note   Gregory Kerr IOE:[id number] DOB: Jan 13, 1941 DOA: 09/22/2014 PCP: Blanchie Serve, MD   Brief Narrative:   Gregory Kerr is an 74 y.o. male with PMH of diastolic CHF, prior stroke with pseudobulbar affect/left-sided hemiplegia who was admitted 09/22/14 with acute gallstone pancreatitis.  Assessment/Plan:   Principal Problem:   Pancreatitis, gallstone with choledocholithiasis and cholelithiasis with possible acute cholangitis  - MRCP performed which showed significant choledocholithiasis and cholecystitis. - General surgery consulted. ERCP today.    - No evidence of cholecystitis, currently on empiric aztreonam and Flagyl for possible cholangitis. - Lipase dramatically improved, LFTs still elevated.  Active Problems:   Hypokalemia - Resolved with potassium added to IV fluids.    CVA, old, hemiparesis/Pseudobulbar affect - Continue Aggrenox. Continue muscle relaxers for spasticity.    Essential hypertension - Hold Lasix and Lopressor for now.    Chronic diastolic heart failure - Currently euvolemic. Lasix currently on hold.    DVT Prophylaxis - SCDs ordered.  Code Status: Full. Family Communication: No family at bedside, declines my offer to call sister. Advised him to have the nurse page me if his sister arrives and would like to speak with me. Disposition Plan: Back to [facility], likely after cholecystectomy, in 3-4 days.   IV Access:    Peripheral IV   Procedures and diagnostic studies:   US Abdomen Complete  09/22/2014   CLINICAL DATA:  Abdominal pain.  Patient has not been NPO.  EXAM: COMPLETE ABDOMINAL ULTRASOUND  COMPARISON:  CT 04/11/2012  FINDINGS: Gallbladder: Layering sludge with some small echogenic subcentimeter calculi. Borderline gallbladder wall thickening possibly secondary date incomplete distention. No pericholecystic fluid. Sonographer reports no sonographic Murphy's sign.  Common bile duct:  Normal in caliber, 38mm diameter.   Liver: Homogeneous in echotexture without focal lesion or intrahepatic bile duct dilatation.  IVC:  Negative  Pancreas: Visualized segments unremarkable, portions obscured by overlying bowel gas.  Spleen:  No focal lesion, craniocaudal 7.8cm in length.  Right Kidney:  No mass or hydronephrosis, 11.1cm in length.  Left Kidney:  No lesion or hydronephrosis, 10.6cm in length.  Abdominal aorta: Visualized segments unremarkable, portions obscured by overlying bowel gas.  IMPRESSION: 1. Cholelithiasis without other ultrasound evidence of cholecystitis or biliary obstruction.   Electronically Signed   By: Lucrezia Europe M.D.   On: 09/22/2014 09:09   Ct Abdomen Pelvis W Contrast  09/22/2014   CLINICAL DATA:  Abdominal pain, left side abdominal pain, history of hiatal hernia  EXAM: CT ABDOMEN AND PELVIS WITH CONTRAST  TECHNIQUE: Multidetector CT imaging of the abdomen and pelvis was performed using the standard protocol following bolus administration of intravenous contrast.  CONTRAST:  37mL OMNIPAQUE IOHEXOL 300 MG/ML  SOLN  COMPARISON:  04/11/2012  FINDINGS: There is a large hiatal hernia measuring 10.5 x 8 cm with at least 2/3 rd of proximal stomach in left lower chest retrocardiac position. There is no evidence of acute gastric volvulus. No gastric outlet obstruction.  Mild hepatic fatty infiltration. No focal hepatic mass. Small gallstones are noted in gallbladder neck region the largest measures 6.5 mm. Borderline thickening of gallbladder wall up to 2.9 mm without evidence of pericholecystic fluid. Again noted mild pancreatic atrophy with fatty replacement. The spleen and adrenal glands are unremarkable. Again noted lobulated renal contour. No hydronephrosis or hydroureter. Cystic lesion along the uncinate process of the pancreas again noted measures 1.7 cm stable in size in appearance from prior exam. In a small portal caval  lymph node measures 1.2 by 0.9 cm stable in size in appearance from prior exam.   Atherosclerotic calcifications of abdominal aorta and iliac arteries. No aortic aneurysm.  There is a cyst in midpole of the right kidney anterolateral aspect measures 1.2 cm. Delayed renal images shows bilateral renal symmetrical excretion. A cyst in upper pole of the right kidney posterior aspect measures 1 cm.  Bilateral visualized proximal ureter is unremarkable.  No small bowel obstruction. No ascites or free air. No adenopathy. Oral contrast material is noted in distal small bowel terminal ileum and right colon. There is no pericecal inflammation. The terminal ileum is unremarkable. The patient is status post appendectomy. There is some stool in distal left colon and sigmoid colon. No evidence of colonic obstruction.  There is a right inguinal scrotal canal hernia containing fat and a loop of terminal small bowel measures 4.4 cm without evidence of small bowel obstruction. There is a left inguinal scrotal canal hernia containing fat measures 3.5 cm without evidence of acute complication. Prostate gland calcifications are noted. Mild enlarged prostate gland measures 4.9 by 3.5 cm. The urinary bladder is unremarkable.  IMPRESSION: 1. Again noted large hiatal hernia with at least 2/3 of proximal stomach in left lower chest retrocardiac position. There is no evidence of gastric volvulus or gastric outlet obstruction. 2. No small bowel or colonic obstruction. 3. Again noted gallstones within gallbladder. Borderline thickening of gallbladder wall up to 2.9 mm. No evidence of pericholecystic fluid. 4. Again noted atrophic pancreas with partial fatty replacement. Stable cystic lesion along the uncinate process of the pancreas. 5. No small bowel obstruction. Again noted right inguinal scrotal hernia containing small bowel without evidence of acute complication or small bowel obstruction. Again noted a left inguinal scrotal canal hernia containing fat without evidence of acute complication. 6. Again noted mild enlarged  prostate gland. 7. Mild hepatic fatty infiltration. 8. No hydronephrosis or hydroureter.  Right renal cysts are noted   Electronically Signed   By: Lahoma Crocker M.D.   On: 09/22/2014 09:28   Dg Chest Port 1 View  09/22/2014   CLINICAL DATA:  Dyspnea  EXAM: PORTABLE CHEST - 1 VIEW  COMPARISON:  04/03/2014  FINDINGS: There is a large hiatal hernia. There is interstitial fluid or thickening a there is mild vascular fullness. This likely represents congestive heart failure. No airspace consolidation is evident. There is no large effusion.  IMPRESSION: Congestive heart failure   Electronically Signed   By: Andreas Newport M.D.   On: 09/22/2014 06:13   Mr Jeananne Kristin Lamagna W/wo Cm/mrcp  09/23/2014   CLINICAL DATA:  74 year old male with mid epigastric pain radiating to his back.  EXAM: MRI ABDOMEN WITHOUT AND WITH CONTRAST (INCLUDING MRCP)  TECHNIQUE: Multiplanar multisequence MR imaging of the abdomen was performed both before and after the administration of intravenous contrast. Heavily T2-weighted images of the biliary and pancreatic ducts were obtained, and three-dimensional MRCP images were rendered by post processing.  CONTRAST:  76mL MULTIHANCE GADOBENATE DIMEGLUMINE 529 MG/ML IV SOLN  COMPARISON:  CT the abdomen and pelvis 09/22/2014.  FINDINGS: Comment: Poor image quality throughout the examination secondary to extensive patient respiratory motion.  Lower chest:  Moderate to large hiatal hernia.  Hepatobiliary: Several tiny filling defects within the gallbladder, compatible with gallstones. Gallbladder does not appear distended, gallbladder wall thickness is normal, there is no pericholecystic fluid to suggest acute cholecystitis at this time. However, there several small filling defects within the mid to distal common bile duct, compatible  with ductal stones. This is best appreciated on the coronal T2 weighted sequence on images 23 and 24 of series 4. At this time, these stones appeared to be nonobstructive, as the common  bile duct measures up to 6-7 mm (within normal limits for the patient's age), and there is no significant intrahepatic biliary ductal dilatation. No discrete cystic or solid hepatic lesions.  Pancreas: Well-defined 11 x 15 mm T1 hypointense, T2 hyperintense, nonenhancing lesion in the uncinate process of the pancreas, similar to slightly smaller than prior studies dating back to at least 04/11/2012, presumably a benign lesion such as a small pancreatic pseudocyst. A similar appearing 1 cm lesion in the tail of the pancreas is also noted, similar to recent prior study 09/22/2014, but cannot be confirmed on more remote prior examinations secondary to lack of IV contrast. No pancreatic ductal dilatation. No pancreatic or peripancreatic inflammatory changes.  Spleen: Unremarkable.  Adrenals/Urinary Tract: There are small lesions in the kidneys bilaterally which are T1 hypointense, T2 hyperintense, and do not appear to enhance (although accurate assessment is limited by extensive patient motion), favored to represent tiny cysts. Bilateral adrenal glands are normal in appearance.  Stomach/Bowel: Moderate to large hiatal hernia. Otherwise, unremarkable.  Vascular/Lymphatic: No aneurysm identified in the visualized abdominal vasculature. No lymphadenopathy.  Other: No significant volume of ascites in the visualized portions of the peritoneal cavity.  Musculoskeletal: No aggressive appearing osseous lesions are noted in the visualized skeleton.  IMPRESSION: 1. Choledocholithiasis and cholelithiasis, without signs of frank biliary tract obstruction or acute cholecystitis at this time. Surgical consultation is recommended. 2. 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 There is also a 1 cm simple appearing cystic lesion in the tail of the pancreas, also favored to  be a benign lesion, however, this cannot be confirmed on remote prior examinations. Repeat MRI of the abdomen with and without IV gadolinium in 1 year is recommended to ensure continued stability of this lesion. This recommendation follows ACR consensus guidelines: Managing Incidental Findings on Abdominal CT: White Paper of the ACR Incidental Findings Committee. J Am Coll Radiol 2010;7:754-773. 3. At the time of follow-up abdominal MRI, attention should be directed to the small cystic lesions in the kidneys to ensure their stability is well. These are favored to represent simple cysts, however, today's study was limited by considerable respiratory motion. The patient should be counseled prior to the followup examination to maintain strict breath holding during the examination to ensure better image quality. 4. Moderate to large hiatal hernia.   Electronically Signed   By: Vinnie Langton M.D.   On: 09/23/2014 09:28     Medical Consultants:    None.  Anti-Infectives:    Aztreonam 09/22/14--->  Flagyl 09/22/14--->  Subjective:   Gregory Kerr denies abdominal pain. Has a sore throat after ERCP. No nausea or vomiting. No dyspnea. Hungry.  Objective:    Filed Vitals:   09/24/14 2147 09/25/14 0414 09/25/14 0450 09/25/14 0644  BP: 139/73  131/71 154/84  Pulse: 119  96 98  Temp:   98.7 F (37.1 C) 98.7 F (37.1 C)  TempSrc:   Oral Oral  Resp: 18  18 18   Height:      Weight:  69.7 kg (153 lb 10.6 oz)    SpO2: 94%  95% 94%    Intake/Output Summary (Last 24 hours)  at 09/25/14 0823 Last data filed at 09/25/14 0450  Gross per 24 hour  Intake 1972.67 ml  Output    900 ml  Net 1072.67 ml    Exam: Gen:  NAD Cardiovascular:  RRR, No M/R/G Respiratory:  Lungs with a few rhonchi Gastrointestinal:  Abdomen soft, NT/ND, + BS Extremities:  No C/E/C   Data Reviewed:    Labs: Basic Metabolic Panel:  Recent Labs Lab 09/22/14 0543 09/22/14 1248 09/23/14 0605 09/24/14 0611  NA  139 136 137 138  K 3.7 3.7 3.4* 4.1  CL 106 105 107 110  CO2 20* 21* 19* 19*  GLUCOSE 118* 105* 119* 112*  BUN 16 14 9 9   CREATININE 0.80 0.86 0.68 0.63  CALCIUM 8.9 8.3* 8.5* 8.5*   GFR Estimated Creatinine Clearance: 83.5 mL/min (by C-G formula based on Cr of 0.63). Liver Function Tests:  Recent Labs Lab 09/22/14 0543 09/22/14 1248 09/23/14 0605 09/24/14 0611  AST 103* 94* 56* 38  ALT 142* 129* 101* 75*  ALKPHOS 475* 420* 397* 365*  BILITOT 3.3* 4.0* 5.6* 1.7*  PROT 7.4 6.8 6.5 6.1*  ALBUMIN 3.2* 2.8* 2.6* 2.6*    Recent Labs Lab 09/22/14 0543 09/23/14 0605  LIPASE 208* 18*   CBC:  Recent Labs Lab 09/22/14 0543 09/23/14 0605  WBC 12.1* 6.4  NEUTROABS 10.7*  --   HGB 14.3 13.3  HCT 42.3 39.8  MCV 87.0 87.3  PLT 216 206   Cardiac Enzymes:  Recent Labs Lab 09/22/14 0543  TROPONINI <0.03   BNP (last 3 results)  Recent Labs  04/03/14 0742  PROBNP 205.3*   Sepsis Labs:  Recent Labs Lab 09/22/14 0543 09/22/14 1000 09/22/14 1010 09/22/14 1248 09/22/14 1257 09/23/14 0605  WBC 12.1*  --   --   --   --  6.4  LATICACIDVEN  --  1.3 1.17 1.2 1.19  --    Microbiology No results found for this or any previous visit (from the past 240 hour(s)).   Medications:     Continuous Infusions: . sodium chloride 20 mL/hr at 09/25/14 0400  . 0.9 % NaCl with KCl 20 mEq / L 75 mL/hr at 09/25/14 0400    Time spent: 25 minutes.    LOS: 3 days   [facility] Pager [phone]. If unable to reach me by pager, please call my cell phone at [phone].  *Please refer to [email or web address], password [email or web address] to get updated schedule on who will round on this patient, as hospitalists switch teams weekly. If 7PM-7AM, please contact night-coverage at [email or web address], password [email or web address] for any overnight needs.  09/25/2014, 8:23 AM

## 2014-09-25 NOTE — Op Note (Signed)
[facility] [place], [id number]   ERCP PROCEDURE REPORT  PATIENT: Gregory Kerr, Gregory Kerr  MR# :[id number] BIRTHDATE: Jan 27, 1941  GENDER: male ENDOSCOPIST: Clarene Essex, MD REFERRED BY: PROCEDURE DATE:  09/25/2014 PROCEDURE:   ERCP with sphincterotomy/papillotomy, ERCP with removal of calculus/calculi , and ERCP with stent placement And removal at end of procedure ASA CLASS:    3 INDICATIONS: positive CBD stones MEDICATIONS:    general anesthesia TOPICAL ANESTHETIC:  per anesthesia  DESCRIPTION OF PROCEDURE:   After the risks benefits and alternatives of the procedure were thoroughly explained, informed consent was obtained.  The Pentax Ercp Scope [id number]  endoscope was introduced through the mouth and advanced to the second portion of the duodenum and the normal-appearing ampulla was brought into view and using the triple lumen sphincterotome loaded with the JAG Jagwire unfortunately deep selective pancreatic cannulation was obtained and is in re positioning the sphincterotome we cannulated the pancreatic duct a few times and we elected to place a pancreatic stent which was done in the customary fashion injecting very minimal dye just to confirm the pancreas and we used a 5 [place] 3 cm single pigtail and once that was placed the sphincterotome was repositioned and we were able to get deep selective cannulation into the CBD fairly soon and a few obvious stones were seen on initial cholangiogram and the wire was advanced into the intrahepatics and we proceeded with the customary sphincterotomy in the usual fashion until he had some biliary drainage and could get the fully bowed sphincterotome easily in and out of the duct and we exchanged the sphincterotome for the adjustable stone removal balloon and proceeded with multiple balloon pull-throughs and 2 stones were delivered on the first and one on the second and none on 2 subsequent balloon  pull-throughs and the balloon passed readily through the patent sphincterotomy site every time and we then proceeded with an occlusion cholangiogram which did not reveal any obvious residual stones andthe cystic duct was patent and stones were confirmed in the gallbladder and there was adequate biliary drainage and the patient tolerated the procedure well there was no obvious immediate complication and we elected to grab the pancreatic stent with the snare at the end of the procedure since very minimal dye was injected into the pancreas and only a few wire advancements. Estimated blood loss is zero unless otherwise noted in this procedure report.       COMPLICATIONS: none  ENDOSCOPIC IMPRESSION:1. Normal ampulla 2. Normal pancreatic duct on minimal injection status post stenting and removal as above to assist with cannulation 3. 3 CBD stones medium-sized status post removal after sphincterotomy as above 4. Negative occlusion cholangiogram and adequate biliary drainage at the end of the procedure  RECOMMENDATIONS:customary post-ERCP observation and if no delayed complications hopefully laparoscopic cholecystectomy tomorrow     _______________________________ eSigned:  Clarene Essex, MD 09/25/2014 8:48 AM   CC:

## 2014-09-25 NOTE — Anesthesia Procedure Notes (Signed)
Procedure Name: Intubation Date/Time: 09/25/2014 7:43 AM Performed by: Gaylene Brooks Pre-anesthesia Checklist: Patient identified, Emergency Drugs available, Suction available, Patient being monitored and Timeout performed Patient Re-evaluated:Patient Re-evaluated prior to inductionOxygen Delivery Method: Circle system utilized Preoxygenation: Pre-oxygenation with 100% oxygen Intubation Type: IV induction, Rapid sequence and Cricoid Pressure applied Laryngoscope Size: Miller and 2 Grade View: Grade I Tube type: Oral Tube size: 7.5 mm Number of attempts: 1 Airway Equipment and Method: Stylet Placement Confirmation: ETT inserted through vocal cords under direct vision,  positive ETCO2 and breath sounds checked- equal and bilateral Secured at: 22 cm Tube secured with: Tape Dental Injury: Teeth and Oropharynx as per pre-operative assessment

## 2014-09-25 NOTE — Anesthesia Preprocedure Evaluation (Addendum)
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 on medications and Pt. on home beta blockers - anginaRhythm:Regular Rate:Normal  '15 Myoview: EF 84%, no ischemia or perfusion defects   Neuro/Psych CVA (L weakness), Residual Symptoms    GI/Hepatic GERD-  Medicated and Controlled,Gallstone pancreatitis Elevated LFTs   Endo/Other  negative endocrine ROS  Renal/GU negative Renal ROS     Musculoskeletal  (+) Arthritis -, Osteoarthritis,    Abdominal   Peds  Hematology negative hematology ROS (+)   Anesthesia Other Findings   Reproductive/Obstetrics                            Anesthesia Physical Anesthesia Plan  ASA: III  Anesthesia Plan: General   Post-op Pain Management:    Induction: Intravenous  Airway Management Planned: Oral ETT  Additional Equipment:   Intra-op Plan:   Post-operative Plan: Extubation in OR  Informed Consent: I have reviewed the patients History and Physical, chart, labs and discussed the procedure including the risks, benefits and alternatives for the proposed anesthesia with the patient or authorized representative who has indicated his/her understanding and acceptance.   Dental advisory given  Plan Discussed with: CRNA and Surgeon  Anesthesia Plan Comments: (Plan routine monitors, GETA)        Anesthesia Quick Evaluation

## 2014-09-25 NOTE — Transfer of Care (Signed)
Immediate Anesthesia Transfer of Care Note  Patient: Gregory Kerr  Procedure(s) Performed: Procedure(s): ENDOSCOPIC RETROGRADE CHOLANGIOPANCREATOGRAPHY (ERCP) (N/A)  Patient Location: Endoscopy Unit  Anesthesia Type:General  Level of Consciousness: awake, alert  and oriented  Airway & Oxygen Therapy: Patient Spontanous Breathing and Patient connected to face mask oxygen  Post-op Assessment: Report given to RN, Post -op Vital signs reviewed and stable and Patient moving all extremities X 4  Post vital signs: Reviewed and stable  Last Vitals:  Filed Vitals:   09/25/14 0644  BP: 154/84  Pulse: 98  Temp: 37.1 C  Resp: 18    Complications: No apparent anesthesia complications

## 2014-09-25 NOTE — Progress Notes (Signed)
Gregory Kerr 7:36 AM  Subjective: Patient doing fine without any complaints this morning  Objective: Vital signs stable afebrile no acute distress exam please see preassessment evaluation no new labs today  Assessment: Gallstones and CBD stones  Plan: Okay to proceed with ERCP with anesthesia assistance and the procedure was rediscussed with the patient and the family  [facility]  Pager [phone] After 5PM or if no answer call [phone]

## 2014-09-25 NOTE — Anesthesia Postprocedure Evaluation (Signed)
  Anesthesia Post-op Note  Patient: Gregory Kerr  Procedure(s) Performed: Procedure(s): ENDOSCOPIC RETROGRADE CHOLANGIOPANCREATOGRAPHY (ERCP) (N/A)  Patient Location: Endoscopy Unit  Anesthesia Type:General  Level of Consciousness: awake, alert , oriented and patient cooperative  Airway and Oxygen Therapy: Patient Spontanous Breathing and Patient connected to nasal cannula oxygen  Post-op Pain: none  Post-op Assessment: Post-op Vital signs reviewed, Patient's Cardiovascular Status Stable, Respiratory Function Stable, Patent Airway, No signs of Nausea or vomiting and Pain level controlled  Post-op Vital Signs: Reviewed and stable  Last Vitals:  Filed Vitals:   09/25/14 0915  BP: 117/72  Pulse: 84  Temp: 36.7 C  Resp: 18    Complications: No apparent anesthesia complications

## 2014-09-25 NOTE — Progress Notes (Signed)
Pt returned from ERCP, resting in bed. 09/25/2014 12:59 PM Gregory Kerr

## 2014-09-25 NOTE — Progress Notes (Signed)
Patient ID: Gregory Kerr, male   DOB: 1940-06-22, 74 y.o.   MRN: [id number]     [facility]      [address] [place], [place] [address]    Phone: [phone] FAX: [phone]     Subjective: Back from ercp, sore.    Objective:  Vital signs:  Filed Vitals:   09/25/14 0900 09/25/14 0905 09/25/14 0910 09/25/14 0915  BP: 110/87     Pulse: 90     Temp:      TempSrc:      Resp: 16     Height:      Weight:      SpO2: 91% 92% 93% 90%    Last BM Date: 09/24/14  Intake/Output   Yesterday:  05/05 0701 - 05/06 0700 In: 1972.7 [P.O.:120; I.V.:1852.7] Out: 900 [Urine:900] This shift:  Total I/O In: 500 [I.V.:500] Out: -    Physical Exam: General: Pt awake/alert/oriented x4 in no acute distress Abdomen: Soft.  Nondistended.  Mild ttp epigastric region.  No evidence of peritonitis.  No incarcerated hernias.   Problem List:   Principal Problem:   Pancreatitis, gallstone Active Problems:   Pseudobulbar affect   Essential hypertension   CVA, old, hemiparesis   Chronic diastolic heart failure   Acute cholangitis   Gallstone pancreatitis   Hypokalemia    Results:   Labs: Results for orders placed or performed during the hospital encounter of 09/22/14 (from the past 48 hour(s))  Comprehensive metabolic panel     Status: Abnormal   Collection Time: 09/24/14  6:11 AM  Result Value Ref Range   Sodium 138 135 - 145 mmol/L   Potassium 4.1 3.5 - 5.1 mmol/L   Chloride 110 101 - 111 mmol/L   CO2 19 (L) 22 - 32 mmol/L   Glucose, Bld 112 (H) 70 - 99 mg/dL   BUN 9 6 - 20 mg/dL   Creatinine, Ser 0.63 0.61 - 1.24 mg/dL   Calcium 8.5 (L) 8.9 - 10.3 mg/dL   Total Protein 6.1 (L) 6.5 - 8.1 g/dL   Albumin 2.6 (L) 3.5 - 5.0 g/dL   AST 38 15 - 41 U/L   ALT 75 (H) 17 - 63 U/L   Alkaline Phosphatase 365 (H) 38 - 126 U/L   Total Bilirubin 1.7 (H) 0.3 - 1.2 mg/dL   GFR calc non Af Amer >60 >60 mL/min   GFR calc Af Amer >60 >60  mL/min    Comment: (NOTE) The eGFR has been calculated using the CKD EPI equation. This calculation has not been validated in all clinical situations. eGFR's persistently <90 mL/min signify possible Chronic Kidney Disease.    Anion gap 9 5 - 15  Type and screen     Status: None   Collection Time: 09/25/14  4:57 AM  Result Value Ref Range   ABO/RH(D) A NEG    Antibody Screen NEG    Sample Expiration 09/28/2014   ABO/Rh     Status: None   Collection Time: 09/25/14  4:57 AM  Result Value Ref Range   ABO/RH(D) A NEG     Imaging / Studies: Dg Ercp Biliary & Pancreatic Ducts  09/25/2014   CLINICAL DATA:  Choledocholithiasis.  EXAM: ERCP  TECHNIQUE: Multiple spot images obtained with the fluoroscopic device and submitted for interpretation post-procedure.  COMPARISON:  MRCP on 09/23/2014  FINDINGS: Imaging obtained with a C-arm at the time of the ERCP procedure demonstrates cannulation of the common  bile duct with contrast injection demonstrating multiple filling defects in the common bile duct consistent with calculi. A balloon sweep maneuver was performed with stone extraction.  IMPRESSION: Imaging submitted during ERCP confirms choledocholithiasis. Balloon sweep stone extraction was performed.  These images were submitted for radiologic interpretation only. Please see the procedural report for the amount of contrast and the fluoroscopy time utilized.   Electronically Signed   By: Aletta Edouard M.D.   On: 09/25/2014 09:04    Medications / Allergies:  Scheduled Meds: . [START ON 09/26/2014] ciprofloxacin  400 mg Intravenous On Call to OR   Continuous Infusions: . sodium chloride 20 mL/hr at 09/25/14 0400  . 0.9 % NaCl with KCl 20 mEq / L 75 mL/hr at 09/25/14 0400   PRN Meds:.HYDROmorphone (DILAUDID) injection, ondansetron **OR** ondansetron (ZOFRAN) IV  Antibiotics: Anti-infectives    Start     Dose/Rate Route Frequency Ordered Stop   09/26/14 0600  ciprofloxacin (CIPRO) IVPB 400 mg     Comments:  Pharmacy may adjust dosing strength, interval, or rate of medication as needed for optimal therapy for the patient Send with patient on call to the OR.  Anesthesia to complete antibiotic administration <9mn [facility]   400 mg 200 mL/hr over 60 Minutes Intravenous On call to O.R. 09/25/14 1008 09/27/14 0559   09/25/14 1000  ciprofloxacin (CIPRO) IVPB 400 mg  Status:  Discontinued     400 mg 200 mL/hr over 60 Minutes Intravenous Every 12 hours 09/25/14 0804 09/25/14 0805   09/25/14 0815  ciprofloxacin (CIPRO) IVPB 400 mg     400 mg 200 mL/hr over 60 Minutes Intravenous  Once 09/25/14 0805 09/25/14 0750   09/22/14 1800  metroNIDAZOLE (FLAGYL) IVPB 500 mg  Status:  Discontinued     500 mg 100 mL/hr over 60 Minutes Intravenous Every 8 hours 09/22/14 1542 09/23/14 1709   09/22/14 1800  aztreonam (AZACTAM) 1 g in dextrose 5 % 50 mL IVPB  Status:  Discontinued     1 g 100 mL/hr over 30 Minutes Intravenous Every 8 hours 09/22/14 1322 09/23/14 1709   09/22/14 1000  ciprofloxacin (CIPRO) IVPB 400 mg     400 mg 200 mL/hr over 60 Minutes Intravenous  Once 09/22/14 0951 09/22/14 1154   09/22/14 1000  metroNIDAZOLE (FLAGYL) IVPB 500 mg     500 mg 100 mL/hr over 60 Minutes Intravenous  Once 09/22/14 0579705/03/16 1143        Assessment/Plan Gallstone pancreatitis and choledocholithiasis S/p ERCP sphincterotomy/papillotomy, stone removal---Dr. MWatt Climes May have clears NPO after midnight for cholecystectomy tomorrow pending OR/surgeon availability Obtain consent cipro on call to OR D/w patient and family   EErby Pian ANP-BC [facility] Pager [phone] For consults and floor pages call [phone](7A-4:30P)  09/25/2014 10:12 AM

## 2014-09-26 ENCOUNTER — Inpatient Hospital Stay (HOSPITAL_COMMUNITY): Payer: Medicare Other | Admitting: Certified Registered"

## 2014-09-26 ENCOUNTER — Inpatient Hospital Stay (HOSPITAL_COMMUNITY): Payer: Medicare Other

## 2014-09-26 ENCOUNTER — Encounter (HOSPITAL_COMMUNITY): Payer: Self-pay | Admitting: Certified Registered Nurse Anesthetist

## 2014-09-26 ENCOUNTER — Encounter (HOSPITAL_COMMUNITY)
Admission: EM | Disposition: A | Discharge status: Skilled Nursing Facility | Payer: Self-pay | Source: Home / Self Care | Attending: Internal Medicine

## 2014-09-26 HISTORY — PX: CHOLECYSTECTOMY: SHX55

## 2014-09-26 LAB — COMPREHENSIVE METABOLIC PANEL WITH GFR
ALT: 71 U/L — ABNORMAL HIGH (ref 17–63)
AST: 63 U/L — ABNORMAL HIGH (ref 15–41)
Albumin: 2.2 g/dL — ABNORMAL LOW (ref 3.5–5.0)
Alkaline Phosphatase: 284 U/L — ABNORMAL HIGH (ref 38–126)
Anion gap: 6 (ref 5–15)
BUN: 8 mg/dL (ref 6–20)
CO2: 20 mmol/L — ABNORMAL LOW (ref 22–32)
Calcium: 7.8 mg/dL — ABNORMAL LOW (ref 8.9–10.3)
Chloride: 112 mmol/L — ABNORMAL HIGH (ref 101–111)
Creatinine, Ser: 0.69 mg/dL (ref 0.61–1.24)
GFR calc Af Amer: >60 mL/min
GFR calc non Af Amer: >60 mL/min
Glucose, Bld: 77 mg/dL (ref 70–99)
Potassium: 4.7 mmol/L (ref 3.5–5.1)
Sodium: 138 mmol/L (ref 135–145)
Total Bilirubin: 2.2 mg/dL — ABNORMAL HIGH (ref 0.3–1.2)
Total Protein: 5.5 g/dL — ABNORMAL LOW (ref 6.5–8.1)

## 2014-09-26 LAB — CBC WITH DIFFERENTIAL/PLATELET
BASOS ABS: 0 10*3/uL (ref 0.0–0.1)
BASOS PCT: 1 % (ref 0–1)
EOS ABS: 0.2 10*3/uL (ref 0.0–0.7)
EOS PCT: 4 % (ref 0–5)
HCT: 35.8 % — ABNORMAL LOW (ref 39.0–52.0)
Hemoglobin: 11.5 g/dL — ABNORMAL LOW (ref 13.0–17.0)
Lymphocytes Relative: 20 % (ref 12–46)
Lymphs Abs: 1 10*3/uL (ref 0.7–4.0)
MCH: 28.8 pg (ref 26.0–34.0)
MCHC: 32.1 g/dL (ref 30.0–36.0)
MCV: 89.7 fL (ref 78.0–100.0)
Monocytes Absolute: 0.3 10*3/uL (ref 0.1–1.0)
Monocytes Relative: 6 % (ref 3–12)
Neutro Abs: 3.5 10*3/uL (ref 1.7–7.7)
Neutrophils Relative %: 69 % (ref 43–77)
PLATELETS: 197 10*3/uL (ref 150–400)
RBC: 3.99 MIL/uL — ABNORMAL LOW (ref 4.22–5.81)
RDW: 16 % — ABNORMAL HIGH (ref 11.5–15.5)
WBC: 5.1 10*3/uL (ref 4.0–10.5)

## 2014-09-26 LAB — BLOOD GAS, ARTERIAL
ACID-BASE DEFICIT: 6.6 mmol/L — AB (ref 0.0–2.0)
Bicarbonate: 17.8 mEq/L — ABNORMAL LOW (ref 20.0–24.0)
Drawn by: 35849
O2 CONTENT: 5 L/min
O2 Saturation: 95.1 %
PCO2 ART: 32 mmHg — AB (ref 35.0–45.0)
Patient temperature: 98.6
TCO2: 18.8 mmol/L (ref 0–100)
pH, Arterial: 7.363 (ref 7.350–7.450)
pO2, Arterial: 68.4 mmHg — ABNORMAL LOW (ref 80.0–100.0)

## 2014-09-26 LAB — BRAIN NATRIURETIC PEPTIDE: B Natriuretic Peptide: 52.3 pg/mL (ref 0.0–100.0)

## 2014-09-26 SURGERY — Surgical Case
Anesthesia: *Unknown

## 2014-09-26 SURGERY — LAPAROSCOPIC CHOLECYSTECTOMY WITH INTRAOPERATIVE CHOLANGIOGRAM
Anesthesia: General | Site: Abdomen

## 2014-09-26 MED ORDER — MIDAZOLAM HCL 2 MG/2ML IJ SOLN: 0.5000 mg | Freq: Once | INTRAMUSCULAR | Status: AC | PRN

## 2014-09-26 MED ORDER — STERILE WATER FOR INJECTION IJ SOLN
INTRAMUSCULAR | Status: AC
  Filled 2014-09-26: qty 10

## 2014-09-26 MED ORDER — GLYCOPYRROLATE 0.2 MG/ML IJ SOLN
INTRAMUSCULAR | Status: DC | PRN
  Administered 2014-09-26: 0.4 mg via INTRAVENOUS

## 2014-09-26 MED ORDER — HEMOSTATIC AGENTS (NO CHARGE) OPTIME
TOPICAL | Status: DC | PRN
  Administered 2014-09-26: 1 via TOPICAL

## 2014-09-26 MED ORDER — GLYCOPYRROLATE 0.2 MG/ML IJ SOLN
INTRAMUSCULAR | Status: AC
  Filled 2014-09-26: qty 3

## 2014-09-26 MED ORDER — SODIUM CHLORIDE 0.9 % IR SOLN
Status: DC | PRN
  Administered 2014-09-26 (×2): 1000 mL

## 2014-09-26 MED ORDER — BUPIVACAINE-EPINEPHRINE 0.25% -1:200000 IJ SOLN
INTRAMUSCULAR | Status: DC | PRN
  Administered 2014-09-26: 10 mL

## 2014-09-26 MED ORDER — PROPOFOL 10 MG/ML IV BOLUS
INTRAVENOUS | Status: DC | PRN
  Administered 2014-09-26: 120 mg via INTRAVENOUS

## 2014-09-26 MED ORDER — ALBUTEROL SULFATE (2.5 MG/3ML) 0.083% IN NEBU
INHALATION_SOLUTION | RESPIRATORY_TRACT | Status: AC
  Filled 2014-09-26: qty 3

## 2014-09-26 MED ORDER — EPHEDRINE SULFATE 50 MG/ML IJ SOLN
INTRAMUSCULAR | Status: AC
  Filled 2014-09-26: qty 1

## 2014-09-26 MED ORDER — MEPERIDINE HCL 25 MG/ML IJ SOLN: 6.2500 mg | INTRAMUSCULAR | Status: DC | PRN

## 2014-09-26 MED ORDER — ROCURONIUM BROMIDE 50 MG/5ML IV SOLN
INTRAVENOUS | Status: AC
  Filled 2014-09-26: qty 1

## 2014-09-26 MED ORDER — ONDANSETRON HCL 4 MG/2ML IJ SOLN
INTRAMUSCULAR | Status: DC | PRN
  Administered 2014-09-26: 4 mg via INTRAVENOUS

## 2014-09-26 MED ORDER — SUCCINYLCHOLINE CHLORIDE 20 MG/ML IJ SOLN
INTRAMUSCULAR | Status: AC
  Filled 2014-09-26: qty 1

## 2014-09-26 MED ORDER — PROPOFOL 10 MG/ML IV BOLUS
INTRAVENOUS | Status: AC
  Filled 2014-09-26: qty 20

## 2014-09-26 MED ORDER — FENTANYL CITRATE (PF) 100 MCG/2ML IJ SOLN
INTRAMUSCULAR | Status: DC | PRN
  Administered 2014-09-26: 50 ug via INTRAVENOUS
  Administered 2014-09-26: 150 ug via INTRAVENOUS

## 2014-09-26 MED ORDER — NEOSTIGMINE METHYLSULFATE 10 MG/10ML IV SOLN
INTRAVENOUS | Status: AC
  Filled 2014-09-26: qty 4

## 2014-09-26 MED ORDER — LIDOCAINE HCL (CARDIAC) 20 MG/ML IV SOLN
INTRAVENOUS | Status: DC | PRN
  Administered 2014-09-26: 100 mg via INTRAVENOUS

## 2014-09-26 MED ORDER — ROCURONIUM BROMIDE 100 MG/10ML IV SOLN
INTRAVENOUS | Status: DC | PRN
  Administered 2014-09-26: 50 mg via INTRAVENOUS

## 2014-09-26 MED ORDER — DIPHENHYDRAMINE HCL 50 MG/ML IJ SOLN
25.0000 mg | Freq: Once | INTRAMUSCULAR | Status: AC
  Administered 2014-09-27: 25 mg via INTRAVENOUS
  Filled 2014-09-26: qty 1

## 2014-09-26 MED ORDER — DIPHENHYDRAMINE HCL 50 MG PO CAPS: 50.0000 mg | ORAL_CAPSULE | Freq: Once | ORAL | Status: DC

## 2014-09-26 MED ORDER — GUAIFENESIN-DM 100-10 MG/5ML PO SYRP
10.0000 mL | ORAL_SOLUTION | ORAL | Status: AC | PRN
  Administered 2014-09-26: 10 mL via ORAL
  Filled 2014-09-26 (×2): qty 10

## 2014-09-26 MED ORDER — HYDROMORPHONE HCL 1 MG/ML IJ SOLN: 0.2500 mg | INTRAMUSCULAR | Status: DC | PRN

## 2014-09-26 MED ORDER — PHENYLEPHRINE 40 MCG/ML (10ML) SYRINGE FOR IV PUSH (FOR BLOOD PRESSURE SUPPORT)
PREFILLED_SYRINGE | INTRAVENOUS | Status: AC
  Filled 2014-09-26: qty 10

## 2014-09-26 MED ORDER — NEOSTIGMINE METHYLSULFATE 10 MG/10ML IV SOLN
INTRAVENOUS | Status: DC | PRN
  Administered 2014-09-26: 3 mg via INTRAVENOUS

## 2014-09-26 MED ORDER — DEXAMETHASONE SODIUM PHOSPHATE 4 MG/ML IJ SOLN
INTRAMUSCULAR | Status: AC
  Filled 2014-09-26: qty 1

## 2014-09-26 MED ORDER — FENTANYL CITRATE (PF) 250 MCG/5ML IJ SOLN
INTRAMUSCULAR | Status: AC
  Filled 2014-09-26: qty 5

## 2014-09-26 MED ORDER — FUROSEMIDE 10 MG/ML IJ SOLN
40.0000 mg | Freq: Once | INTRAMUSCULAR | Status: AC
  Administered 2014-09-26: 40 mg via INTRAVENOUS
  Filled 2014-09-26: qty 4

## 2014-09-26 MED ORDER — DIPHENHYDRAMINE HCL 25 MG PO CAPS
25.0000 mg | ORAL_CAPSULE | ORAL | Status: DC | PRN
Start: 2014-09-26 — End: 2014-09-28

## 2014-09-26 MED ORDER — LIDOCAINE HCL (CARDIAC) 20 MG/ML IV SOLN
INTRAVENOUS | Status: AC
  Filled 2014-09-26: qty 5

## 2014-09-26 MED ORDER — ONDANSETRON HCL 4 MG/2ML IJ SOLN
INTRAMUSCULAR | Status: AC
  Filled 2014-09-26: qty 2

## 2014-09-26 MED ORDER — PHENYLEPHRINE HCL 10 MG/ML IJ SOLN
INTRAMUSCULAR | Status: DC | PRN
  Administered 2014-09-26 (×5): 80 ug via INTRAVENOUS

## 2014-09-26 MED ORDER — GUAIFENESIN-DM 100-10 MG/5ML PO SYRP: 5.0000 mL | ORAL_SOLUTION | ORAL | Status: DC | PRN

## 2014-09-26 MED ORDER — HYDROMORPHONE HCL 1 MG/ML IJ SOLN
INTRAMUSCULAR | Status: AC
  Filled 2014-09-26: qty 1

## 2014-09-26 MED ORDER — PHENYLEPHRINE HCL 10 MG/ML IJ SOLN
10.0000 mg | INTRAVENOUS | Status: DC | PRN
  Administered 2014-09-26: 20 ug/min via INTRAVENOUS

## 2014-09-26 MED ORDER — BUPIVACAINE-EPINEPHRINE (PF) 0.25% -1:200000 IJ SOLN
INTRAMUSCULAR | Status: AC
  Filled 2014-09-26: qty 30

## 2014-09-26 MED ORDER — 0.9 % SODIUM CHLORIDE (POUR BTL) OPTIME
TOPICAL | Status: DC | PRN
  Administered 2014-09-26: 1000 mL

## 2014-09-26 MED ORDER — GUAIFENESIN ER 600 MG PO TB12
600.0000 mg | ORAL_TABLET | Freq: Two times a day (BID) | ORAL | Status: DC
  Administered 2014-09-26 – 2014-09-28 (×4): 600 mg via ORAL
  Filled 2014-09-26 (×5): qty 1

## 2014-09-26 MED ORDER — DEXAMETHASONE SODIUM PHOSPHATE 4 MG/ML IJ SOLN
INTRAMUSCULAR | Status: DC | PRN
  Administered 2014-09-26: 4 mg via INTRAVENOUS

## 2014-09-26 MED ORDER — ALBUTEROL SULFATE (2.5 MG/3ML) 0.083% IN NEBU
2.5000 mg | INHALATION_SOLUTION | Freq: Four times a day (QID) | RESPIRATORY_TRACT | Status: DC | PRN
  Administered 2014-09-26: 2.5 mg via RESPIRATORY_TRACT

## 2014-09-26 MED ORDER — ONDANSETRON HCL 4 MG/2ML IJ SOLN: 4.0000 mg | Freq: Once | INTRAMUSCULAR | Status: AC | PRN

## 2014-09-26 MED ORDER — MIDAZOLAM HCL 2 MG/2ML IJ SOLN
INTRAMUSCULAR | Status: AC
  Filled 2014-09-26: qty 2

## 2014-09-26 MED ORDER — PROMETHAZINE HCL 25 MG/ML IJ SOLN
6.2500 mg | INTRAMUSCULAR | Status: DC | PRN
Start: 2014-09-26 — End: 2014-09-28

## 2014-09-26 MED ORDER — LACTATED RINGERS IV SOLN
INTRAVENOUS | Status: DC | PRN
  Administered 2014-09-26: 09:00:00 via INTRAVENOUS

## 2014-09-26 MED ORDER — MIDAZOLAM HCL 5 MG/5ML IJ SOLN
INTRAMUSCULAR | Status: DC | PRN
  Administered 2014-09-26: 2 mg via INTRAVENOUS

## 2014-09-26 SURGICAL SUPPLY — 41 items
APPLIER CLIP ROT 10 11.4 M/L (STAPLE) ×3
BENZOIN TINCTURE PRP APPL 2/3 (GAUZE/BANDAGES/DRESSINGS) ×3 IMPLANT
BLADE SURG ROTATE 9660 (MISCELLANEOUS) IMPLANT
CANISTER SUCTION 2500CC (MISCELLANEOUS) ×3 IMPLANT
CHLORAPREP W/TINT 26ML (MISCELLANEOUS) ×3 IMPLANT
CLIP APPLIE ROT 10 11.4 M/L (STAPLE) ×2 IMPLANT
COVER MAYO STAND STRL (DRAPES) ×3 IMPLANT
COVER SURGICAL LIGHT HANDLE (MISCELLANEOUS) ×3 IMPLANT
DRAPE C-ARM 42X72 X-RAY (DRAPES) ×3 IMPLANT
DRAPE LAPAROSCOPIC ABDOMINAL (DRAPES) ×6 IMPLANT
DRSG TEGADERM 2-3/8X2-3/4 SM (GAUZE/BANDAGES/DRESSINGS) ×9 IMPLANT
DRSG TEGADERM 4X4.75 (GAUZE/BANDAGES/DRESSINGS) ×3 IMPLANT
ELECT REM PT RETURN 9FT ADLT (ELECTROSURGICAL) ×3
ELECTRODE REM PT RTRN 9FT ADLT (ELECTROSURGICAL) ×2 IMPLANT
FILTER SMOKE EVAC LAPAROSHD (FILTER) ×3 IMPLANT
GAUZE SPONGE 2X2 8PLY STRL LF (GAUZE/BANDAGES/DRESSINGS) ×2 IMPLANT
GLOVE BIO SURGEON STRL SZ7 (GLOVE) ×3 IMPLANT
GLOVE BIOGEL PI IND STRL 7.5 (GLOVE) ×2 IMPLANT
GLOVE BIOGEL PI INDICATOR 7.5 (GLOVE) ×1
GOWN STRL REUS W/ TWL LRG LVL3 (GOWN DISPOSABLE) ×6 IMPLANT
GOWN STRL REUS W/TWL LRG LVL3 (GOWN DISPOSABLE) ×3
HEMOSTAT SNOW SURGICEL 2X4 (HEMOSTASIS) ×3 IMPLANT
KIT BASIN OR (CUSTOM PROCEDURE TRAY) ×3 IMPLANT
KIT ROOM TURNOVER OR (KITS) ×3 IMPLANT
NS IRRIG 1000ML POUR BTL (IV SOLUTION) ×3 IMPLANT
PAD ARMBOARD 7.5X6 YLW CONV (MISCELLANEOUS) ×3 IMPLANT
POUCH SPECIMEN RETRIEVAL 10MM (ENDOMECHANICALS) ×3 IMPLANT
SCISSORS LAP 5X35 DISP (ENDOMECHANICALS) ×3 IMPLANT
SET CHOLANGIOGRAPH 5 50 .035 (SET/KITS/TRAYS/PACK) ×3 IMPLANT
SET IRRIG TUBING LAPAROSCOPIC (IRRIGATION / IRRIGATOR) ×3 IMPLANT
SLEEVE ENDOPATH XCEL 5M (ENDOMECHANICALS) ×3 IMPLANT
SPECIMEN JAR SMALL (MISCELLANEOUS) ×3 IMPLANT
SPONGE GAUZE 2X2 STER 10/PKG (GAUZE/BANDAGES/DRESSINGS) ×1
SUT MNCRL AB 4-0 PS2 18 (SUTURE) ×3 IMPLANT
TOWEL OR 17X24 6PK STRL BLUE (TOWEL DISPOSABLE) ×3 IMPLANT
TOWEL OR 17X26 10 PK STRL BLUE (TOWEL DISPOSABLE) ×3 IMPLANT
TRAY LAPAROSCOPIC (CUSTOM PROCEDURE TRAY) ×3 IMPLANT
TROCAR XCEL BLUNT TIP 100MML (ENDOMECHANICALS) ×3 IMPLANT
TROCAR XCEL NON-BLD 11X100MML (ENDOMECHANICALS) ×3 IMPLANT
TROCAR XCEL NON-BLD 5MMX100MML (ENDOMECHANICALS) ×3 IMPLANT
TUBING INSUFFLATION (TUBING) ×3 IMPLANT

## 2014-09-26 NOTE — Progress Notes (Signed)
MD notified that patient has a newly developed rash on his back.  Per previous RN, this rash was not present during am assessment prior to procedure. New orders given for benadryl.  Will continue to monitor patient.

## 2014-09-26 NOTE — Anesthesia Procedure Notes (Signed)
Procedure Name: Intubation Date/Time: 09/26/2014 9:59 AM Performed by: Garrison Columbus T Pre-anesthesia Checklist: Patient identified, Emergency Drugs available, Suction available and Patient being monitored Patient Re-evaluated:Patient Re-evaluated prior to inductionOxygen Delivery Method: Circle system utilized Preoxygenation: Pre-oxygenation with 100% oxygen Intubation Type: IV induction Ventilation: Mask ventilation without difficulty and Oral airway inserted - appropriate to patient size Laryngoscope Size: Sabra Heck and 2 Grade View: Grade I Tube type: Oral Tube size: 7.5 mm Number of attempts: 1 Airway Equipment and Method: Stylet and Oral airway Placement Confirmation: ETT inserted through vocal cords under direct vision,  positive ETCO2 and breath sounds checked- equal and bilateral Secured at: 22 cm Tube secured with: Tape Dental Injury: Teeth and Oropharynx as per pre-operative assessment

## 2014-09-26 NOTE — Op Note (Signed)
Laparoscopic Cholecystectomy with IOC Procedure Note  Indications: This patient presents with symptomatic gallbladder disease and will undergo laparoscopic cholecystectomy.  Pre-operative Diagnosis: Gallstone pancreatitis  Post-operative Diagnosis: Same  Surgeon: Shahir Karen K.   Assistants: None  Anesthesia: General endotracheal anesthesia  ASA Class: 3  Procedure Details  The patient was seen again in the Holding Room. The risks, benefits, complications, treatment options, and expected outcomes were discussed with the patient. The possibilities of reaction to medication, pulmonary aspiration, perforation of viscus, bleeding, recurrent infection, finding a normal gallbladder, the need for additional procedures, failure to diagnose a condition, the possible need to convert to an open procedure, and creating a complication requiring transfusion or operation were discussed with the patient. The likelihood of improving the patient's symptoms with return to their baseline status is good.  The patient and/or family concurred with the proposed plan, giving informed consent. The site of surgery properly noted. The patient was taken to Operating Room, identified as FONTAINE HEHL and the procedure verified as Laparoscopic Cholecystectomy with Intraoperative Cholangiogram. A Time Out was held and the above information confirmed.  Prior to the induction of general anesthesia, antibiotic prophylaxis was administered. General endotracheal anesthesia was then administered and tolerated well. After the induction, the abdomen was prepped with Chloraprep and draped in the sterile fashion. The patient was positioned in the supine position.  Local anesthetic agent was injected into the skin near the umbilicus and an incision made. We dissected down to the abdominal fascia with blunt dissection.  The fascia was incised vertically and we entered the peritoneal cavity bluntly.  A pursestring suture of 0-Vicryl was  placed around the fascial opening.  The Hasson cannula was inserted and secured with the stay suture.  Pneumoperitoneum was then created with CO2 and tolerated well without any adverse changes in the patient's vital signs. An 11-mm port was placed in the subxiphoid position.  Two 5-mm ports were placed in the right upper quadrant. All skin incisions were infiltrated with a local anesthetic agent before making the incision and placing the trocars.   We positioned the patient in reverse Trendelenburg, tilted slightly to the patient's left.  The gallbladder was identified, the fundus grasped and retracted cephalad.  He had significant omental adhesions to the liver and the gallbladder. Adhesions were lysed bluntly and with the electrocautery where indicated, taking care not to injure any adjacent organs or viscus. The infundibulum was grasped and retracted laterally, exposing the peritoneum overlying the triangle of Calot. This was then divided and exposed in a blunt fashion. A critical view of the cystic duct and cystic artery was obtained.  The cystic duct was clearly identified and bluntly dissected circumferentially. The cystic duct was ligated with a clip distally.   An incision was made in the cystic duct and the [facility] cholangiogram catheter introduced. The catheter was secured using a clip. A cholangiogram was then obtained which showed good visualization of the distal and proximal biliary tree with no sign of filling defects or obstruction.  Contrast flowed easily into the duodenum. The catheter was then removed.   The cystic duct was then ligated with clips and divided. The cystic artery was identified, dissected free, ligated with clips and divided as well.   The gallbladder was dissected from the liver bed in retrograde fashion with the electrocautery. The gallbladder was removed and placed in an Endocatch sac. The liver bed was irrigated and inspected. There was significant oozing from the liver bed,  as the patient is  chronically on Aggrenox.  Hemostasis was achieved with the electrocautery and Surgicel SNOW. Copious irrigation was utilized and was repeatedly aspirated until clear.  The gallbladder and Endocatch sac were then removed through the umbilical port site.  The pursestring suture was used to close the umbilical fascia.    We again inspected the right upper quadrant for hemostasis.  Pneumoperitoneum was released as we removed the trocars.  4-0 Monocryl was used to close the skin.   Benzoin, steri-strips, and clean dressings were applied. The patient was then extubated and brought to the recovery room in stable condition. Instrument, sponge, and needle counts were correct at closure and at the conclusion of the case.   Findings: Cholecystitis with Cholelithiasis  Estimated Blood Loss: 200 mL         Drains: none         Specimens: Gallbladder           Complications: None; patient tolerated the procedure well.         Disposition: PACU - hemodynamically stable.         Condition: stable   Gregory Kerr. Georgette Dover, MD, [facility]  General/ Trauma Surgery  09/26/2014 11:33 AM

## 2014-09-26 NOTE — Anesthesia Postprocedure Evaluation (Signed)
Anesthesia Post Note  Patient: Gregory Kerr  Procedure(s) Performed: Procedure(s) (LRB): LAPAROSCOPIC CHOLECYSTECTOMY WITH INTRAOPERATIVE CHOLANGIOGRAM (N/A)  Anesthesia type: general  Patient location: PACU  Post pain: Pain level controlled  Post assessment: Patient's Cardiovascular Status Stable  Last Vitals:  Filed Vitals:   09/26/14 1215  BP: 130/60  Pulse: 89  Temp:   Resp: 12    Post vital signs: Reviewed and stable  Level of consciousness: sedated  Complications: No apparent anesthesia complications

## 2014-09-26 NOTE — Progress Notes (Addendum)
Progress Note   [name] FYB:[id number] DOB: [birth year] DOA: 09/22/2014 PCP: Blanchie Serve, MD   Brief Narrative:   Gregory Kerr is an 74 y.o. male with PMH of diastolic CHF, prior stroke with pseudobulbar affect/left-sided hemiplegia who was admitted 09/22/14 with acute gallstone pancreatitis.  Assessment/Plan:   Principal Problem:   Pancreatitis, gallstone with choledocholithiasis and cholelithiasis with possible acute cholangitis  - MRCP performed which showed significant choledocholithiasis and cholecystitis. - General surgery consulted. ERCP done 09/25/14, and laparoscopic cholecystectomy done 09/26/14.    - No evidence of cholecystitis, empiric antibiotics discontinued. - Lipase dramatically improved, LFTs still elevated.  Active Problems:   Hypokalemia - Resolved with potassium added to IV fluids.    CVA, old, hemiparesis/Pseudobulbar affect - Continue Aggrenox. Continue muscle relaxers for spasticity.    Essential hypertension - Hold Lasix and Lopressor for now.    Chronic diastolic heart failure - Currently euvolemic. Lasix currently on hold.    DVT Prophylaxis - SCDs ordered.  Code Status: Full. Family Communication: Sister, [name], updated at the bedside. Disposition Plan: Back to [facility], likely after cholecystectomy, in 1-2 days.   IV Access:    Peripheral IV   Procedures and diagnostic studies:   Dg Cholangiogram Operative  09/26/2014   CLINICAL DATA:  74 year old male with a history of laparoscopic cholecystectomy  EXAM: INTRAOPERATIVE CHOLANGIOGRAM  TECHNIQUE: Cholangiographic images from the C-arm fluoroscopic device were submitted for interpretation post-operatively. Please see the procedural report for the amount of contrast and the fluoroscopy time utilized.  COMPARISON:  ERCP 09/25/2014, CT 5 3 2016  FINDINGS: Surgical instruments project over the upper abdomen.  There is cannulation of the cystic duct/gallbladder neck, with antegrade  infusion of contrast. Caliber of the extrahepatic ductal system within normal limits.  No large filling defect identified.  Free flow of contrast across the ampulla.  IMPRESSION: Intraoperative cholangiogram demonstrates extrahepatic biliary ducts of unremarkable caliber, with no large filling defect identified. Free flow of contrast across the ampulla.  Please refer to the dictated operative report for full details of intraoperative findings and procedure  Signed,  Dulcy Fanny. Earleen Newport, DO  [facility]  [facility]   Electronically Signed   By: Corrie Mckusick D.O.   On: 09/26/2014 11:24   US Abdomen Complete  09/22/2014   CLINICAL DATA:  Abdominal pain.  Patient has not been NPO.  EXAM: COMPLETE ABDOMINAL ULTRASOUND  COMPARISON:  CT 04/11/2012  FINDINGS: Gallbladder: Layering sludge with some small echogenic subcentimeter calculi. Borderline gallbladder wall thickening possibly secondary date incomplete distention. No pericholecystic fluid. Sonographer reports no sonographic Murphy's sign.  Common bile duct:  Normal in caliber, 66mm diameter.  Liver: Homogeneous in echotexture without focal lesion or intrahepatic bile duct dilatation.  IVC:  Negative  Pancreas: Visualized segments unremarkable, portions obscured by overlying bowel gas.  Spleen:  No focal lesion, craniocaudal 7.8cm in length.  Right Kidney:  No mass or hydronephrosis, 11.1cm in length.  Left Kidney:  No lesion or hydronephrosis, 10.6cm in length.  Abdominal aorta: Visualized segments unremarkable, portions obscured by overlying bowel gas.  IMPRESSION: 1. Cholelithiasis without other ultrasound evidence of cholecystitis or biliary obstruction.   Electronically Signed   By: Lucrezia Europe M.D.   On: 09/22/2014 09:09   Ct Abdomen Pelvis W Contrast  09/22/2014   CLINICAL DATA:  Abdominal pain, left side abdominal pain, history of hiatal hernia  EXAM: CT ABDOMEN AND PELVIS WITH CONTRAST  TECHNIQUE: Multidetector CT  imaging of the abdomen and pelvis was performed using the standard protocol following bolus administration of intravenous contrast.  CONTRAST:  66mL OMNIPAQUE IOHEXOL 300 MG/ML  SOLN  COMPARISON:  04/11/2012  FINDINGS: There is a large hiatal hernia measuring 10.5 x 8 cm with at least 2/3 rd of proximal stomach in left lower chest retrocardiac position. There is no evidence of acute gastric volvulus. No gastric outlet obstruction.  Mild hepatic fatty infiltration. No focal hepatic mass. Small gallstones are noted in gallbladder neck region the largest measures 6.5 mm. Borderline thickening of gallbladder wall up to 2.9 mm without evidence of pericholecystic fluid. Again noted mild pancreatic atrophy with fatty replacement. The spleen and adrenal glands are unremarkable. Again noted lobulated renal contour. No hydronephrosis or hydroureter. Cystic lesion along the uncinate process of the pancreas again noted measures 1.7 cm stable in size in appearance from prior exam. In a small portal caval lymph node measures 1.2 by 0.9 cm stable in size in appearance from prior exam.  Atherosclerotic calcifications of abdominal aorta and iliac arteries. No aortic aneurysm.  There is a cyst in midpole of the right kidney anterolateral aspect measures 1.2 cm. Delayed renal images shows bilateral renal symmetrical excretion. A cyst in upper pole of the right kidney posterior aspect measures 1 cm.  Bilateral visualized proximal ureter is unremarkable.  No small bowel obstruction. No ascites or free air. No adenopathy. Oral contrast material is noted in distal small bowel terminal ileum and right colon. There is no pericecal inflammation. The terminal ileum is unremarkable. The patient is status post appendectomy. There is some stool in distal left colon and sigmoid colon. No evidence of colonic obstruction.  There is a right inguinal scrotal canal hernia containing fat and a loop of terminal small bowel measures 4.4 cm without  evidence of small bowel obstruction. There is a left inguinal scrotal canal hernia containing fat measures 3.5 cm without evidence of acute complication. Prostate gland calcifications are noted. Mild enlarged prostate gland measures 4.9 by 3.5 cm. The urinary bladder is unremarkable.  IMPRESSION: 1. Again noted large hiatal hernia with at least 2/3 of proximal stomach in left lower chest retrocardiac position. There is no evidence of gastric volvulus or gastric outlet obstruction. 2. No small bowel or colonic obstruction. 3. Again noted gallstones within gallbladder. Borderline thickening of gallbladder wall up to 2.9 mm. No evidence of pericholecystic fluid. 4. Again noted atrophic pancreas with partial fatty replacement. Stable cystic lesion along the uncinate process of the pancreas. 5. No small bowel obstruction. Again noted right inguinal scrotal hernia containing small bowel without evidence of acute complication or small bowel obstruction. Again noted a left inguinal scrotal canal hernia containing fat without evidence of acute complication. 6. Again noted mild enlarged prostate gland. 7. Mild hepatic fatty infiltration. 8. No hydronephrosis or hydroureter.  Right renal cysts are noted   Electronically Signed   By: Lahoma Crocker M.D.   On: 09/22/2014 09:28   Dg Chest Port 1 View  09/26/2014   CLINICAL DATA:  Postop check. Status post laparoscopic cholecystectomy.  EXAM: PORTABLE CHEST - 1 VIEW  COMPARISON:  09/22/2014  FINDINGS: Lung volumes are low. Large hiatal hernia projecting behind the cardiac silhouette. There is opacity at the lung bases most likely atelectasis. No convincing pulmonary edema.  No pneumothorax.  Cardiac silhouette is normal in size.  Bony thorax is grossly intact.  IMPRESSION: Limited exam due to low lung volumes. No convincing pneumonia or pulmonary edema. There is  basilar atelectasis, greater on the left adjacent to a large hiatal hernia.   Electronically Signed   By: Lajean Manes  M.D.   On: 09/26/2014 13:21   Dg Chest Port 1 View  09/22/2014   CLINICAL DATA:  Dyspnea  EXAM: PORTABLE CHEST - 1 VIEW  COMPARISON:  04/03/2014  FINDINGS: There is a large hiatal hernia. There is interstitial fluid or thickening a there is mild vascular fullness. This likely represents congestive heart failure. No airspace consolidation is evident. There is no large effusion.  IMPRESSION: Congestive heart failure   Electronically Signed   By: Andreas Newport M.D.   On: 09/22/2014 06:13   Dg Ercp Biliary & Pancreatic Ducts  09/25/2014   CLINICAL DATA:  Choledocholithiasis.  EXAM: ERCP  TECHNIQUE: Multiple spot images obtained with the fluoroscopic device and submitted for interpretation post-procedure.  COMPARISON:  MRCP on 09/23/2014  FINDINGS: Imaging obtained with a C-arm at the time of the ERCP procedure demonstrates cannulation of the common bile duct with contrast injection demonstrating multiple filling defects in the common bile duct consistent with calculi. A balloon sweep maneuver was performed with stone extraction.  IMPRESSION: Imaging submitted during ERCP confirms choledocholithiasis. Balloon sweep stone extraction was performed.  These images were submitted for radiologic interpretation only. Please see the procedural report for the amount of contrast and the fluoroscopy time utilized.   Electronically Signed   By: Aletta Edouard M.D.   On: 09/25/2014 09:04   Mr Jeananne Leilanni Halvorson W/wo Cm/mrcp  09/23/2014   CLINICAL DATA:  74 year old male with mid epigastric pain radiating to his back.  EXAM: MRI ABDOMEN WITHOUT AND WITH CONTRAST (INCLUDING MRCP)  TECHNIQUE: Multiplanar multisequence MR imaging of the abdomen was performed both before and after the administration of intravenous contrast. Heavily T2-weighted images of the biliary and pancreatic ducts were obtained, and three-dimensional MRCP images were rendered by post processing.  CONTRAST:  3mL MULTIHANCE GADOBENATE DIMEGLUMINE 529 MG/ML IV SOLN   COMPARISON:  CT the abdomen and pelvis 09/22/2014.  FINDINGS: Comment: Poor image quality throughout the examination secondary to extensive patient respiratory motion.  Lower chest:  Moderate to large hiatal hernia.  Hepatobiliary: Several tiny filling defects within the gallbladder, compatible with gallstones. Gallbladder does not appear distended, gallbladder wall thickness is normal, there is no pericholecystic fluid to suggest acute cholecystitis at this time. However, there several small filling defects within the mid to distal common bile duct, compatible with ductal stones. This is best appreciated on the coronal T2 weighted sequence on images 23 and 24 of series 4. At this time, these stones appeared to be nonobstructive, as the common bile duct measures up to 6-7 mm (within normal limits for the patient's age), and there is no significant intrahepatic biliary ductal dilatation. No discrete cystic or solid hepatic lesions.  Pancreas: Well-defined 11 x 15 mm T1 hypointense, T2 hyperintense, nonenhancing lesion in the uncinate process of the pancreas, similar to slightly smaller than prior studies dating back to at least 04/11/2012, presumably a benign lesion such as a small pancreatic pseudocyst. A similar appearing 1 cm lesion in the tail of the pancreas is also noted, similar to recent prior study 09/22/2014, but cannot be confirmed on more remote prior examinations secondary to lack of IV contrast. No pancreatic ductal dilatation. No pancreatic or peripancreatic inflammatory changes.  Spleen: Unremarkable.  Adrenals/Urinary Tract: There are small lesions in the kidneys bilaterally which are T1 hypointense, T2 hyperintense, and do not appear to enhance (although accurate assessment is  limited by extensive patient motion), favored to represent tiny cysts. Bilateral adrenal glands are normal in appearance.  Stomach/Bowel: Moderate to large hiatal hernia. Otherwise, unremarkable.  Vascular/Lymphatic: No  aneurysm identified in the visualized abdominal vasculature. No lymphadenopathy.  Other: No significant volume of ascites in the visualized portions of the peritoneal cavity.  Musculoskeletal: No aggressive appearing osseous lesions are noted in the visualized skeleton.  IMPRESSION: 1. Choledocholithiasis and cholelithiasis, without signs of frank biliary tract obstruction or acute cholecystitis at this time. Surgical consultation is recommended. 2. 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 There is also a 1 cm simple appearing cystic lesion in the tail of the pancreas, also favored to be a benign lesion, however, this cannot be confirmed on remote prior examinations. Repeat MRI of the abdomen with and without IV gadolinium in 1 year is recommended to ensure continued stability of this lesion. This recommendation follows ACR consensus guidelines: Managing Incidental Findings on Abdominal CT: White Paper of the ACR Incidental Findings Committee. J Am Coll Radiol 2010;7:754-773. 3. At the time of follow-up abdominal MRI, attention should be directed to the small cystic lesions in the kidneys to ensure their stability is well. These are favored to represent simple cysts, however, today's study was limited by considerable respiratory motion. The patient should be counseled prior to the followup examination to maintain strict breath holding during the examination to ensure better image quality. 4. Moderate to large hiatal hernia.   Electronically Signed   By: Vinnie Langton M.D.   On: 09/23/2014 09:28     Medical Consultants:    None.  Anti-Infectives:    Aztreonam 09/22/14--->09/26/14  Flagyl 09/22/14--->09/26/14  Subjective:   Heywood Bene has some RUQ discomfort. Occasional cough, says he cannot get the mucous up. No N/V.  No  dyspnea.  Objective:    Filed Vitals:   09/26/14 1315 09/26/14 1330 09/26/14 1345 09/26/14 1414  BP: 123/72 125/68 125/68 132/79  Pulse: 104 100 107 107  Temp: 98.2 F (36.8 C)   97.6 F (36.4 C)  TempSrc:    Oral  Resp: 19 19 21 20   Height:      Weight:      SpO2: 90% 91% 91% 93%    Intake/Output Summary (Last 24 hours) at 09/26/14 1453 Last data filed at 09/26/14 1117  Gross per 24 hour  Intake 1066.25 ml  Output    450 ml  Net 616.25 ml    Exam: Gen:  NAD, sleepy Cardiovascular:  RRR, No M/R/G Respiratory:  Lungs with a few rhonchi Gastrointestinal:  Abdomen soft, NT/ND, + BS Extremities:  No C/E/C   Data Reviewed:    Labs: Basic Metabolic Panel:  Recent Labs Lab 09/22/14 0543 09/22/14 1248 09/23/14 0605 09/24/14 0611 09/26/14 0721  NA 139 136 137 138 138  K 3.7 3.7 3.4* 4.1 4.7  CL 106 105 107 110 112*  CO2 20* 21* 19* 19* 20*  GLUCOSE 118* 105* 119* 112* 77  BUN 16 14 9 9 8   CREATININE 0.80 0.86 0.68 0.63 0.69  CALCIUM 8.9 8.3* 8.5* 8.5* 7.8*   GFR Estimated Creatinine Clearance: 84.7 mL/min (by C-G formula based on Cr of 0.69). Liver Function Tests:  Recent Labs Lab 09/22/14 0543 09/22/14 1248 09/23/14 0605 09/24/14 0611 09/26/14 0721  AST 103* 94* 56* 38 63*  ALT 142* 129*  101* 75* 71*  ALKPHOS 475* 420* 397* 365* 284*  BILITOT 3.3* 4.0* 5.6* 1.7* 2.2*  PROT 7.4 6.8 6.5 6.1* 5.5*  ALBUMIN 3.2* 2.8* 2.6* 2.6* 2.2*    Recent Labs Lab 09/22/14 0543 09/23/14 0605  LIPASE 208* 18*   CBC:  Recent Labs Lab 09/22/14 0543 09/23/14 0605 09/26/14 0721  WBC 12.1* 6.4 5.1  NEUTROABS 10.7*  --  3.5  HGB 14.3 13.3 11.5*  HCT 42.3 39.8 35.8*  MCV 87.0 87.3 89.7  PLT 216 206 197   Cardiac Enzymes:  Recent Labs Lab 09/22/14 0543  TROPONINI <0.03   BNP (last 3 results)  Recent Labs  04/03/14 0742  PROBNP 205.3*   Sepsis Labs:  Recent Labs Lab 09/22/14 0543 09/22/14 1000 09/22/14 1010 09/22/14 1248 09/22/14 1257  09/23/14 0605 09/26/14 0721  WBC 12.1*  --   --   --   --  6.4 5.1  LATICACIDVEN  --  1.3 1.17 1.2 1.19  --   --    Microbiology Recent Results (from the past 240 hour(s))  MRSA PCR Screening     Status: None   Collection Time: 09/25/14  4:09 PM  Result Value Ref Range Status   MRSA by PCR NEGATIVE NEGATIVE Final    Comment:        The GeneXpert MRSA Assay (FDA approved for NASAL specimens only), is one component of a comprehensive MRSA colonization surveillance program. It is not intended to diagnose MRSA infection nor to guide or monitor treatment for MRSA infections.   Surgical pcr screen     Status: None   Collection Time: 09/25/14  7:55 PM  Result Value Ref Range Status   MRSA, PCR NEGATIVE NEGATIVE Final   Staphylococcus aureus NEGATIVE NEGATIVE Final    Comment:        The Xpert SA Assay (FDA approved for NASAL specimens in patients over 61 years of age), is one component of a comprehensive surveillance program.  Test performance has been validated by [facility] for patients greater than or equal to 75 year old. It is not intended to diagnose infection nor to guide or monitor treatment.      Medications:   . albuterol      . guaiFENesin  600 mg Oral BID  . HYDROmorphone       Continuous Infusions: . 0.9 % NaCl with KCl 20 mEq / L 75 mL/hr at 09/25/14 0400    Time spent: 25 minutes.    LOS: 4 days   [facility] Pager [phone]. If unable to reach me by pager, please call my cell phone at [phone].  *Please refer to [email or web address], password [email or web address] to get updated schedule on who will round on this patient, as hospitalists switch teams weekly. If 7PM-7AM, please contact night-coverage at [email or web address], password [email or web address] for any overnight needs.  09/26/2014, 2:53 PM

## 2014-09-26 NOTE — Transfer of Care (Signed)
Immediate Anesthesia Transfer of Care Note  Patient: Gregory Kerr  Procedure(s) Performed: Procedure(s): LAPAROSCOPIC CHOLECYSTECTOMY WITH INTRAOPERATIVE CHOLANGIOGRAM (N/A)  Patient Location: PACU  Anesthesia Type:General  Level of Consciousness: awake and alert   Airway & Oxygen Therapy: Patient Spontanous Breathing and Patient connected to face mask oxygen  Post-op Assessment: Report given to RN and Post -op Vital signs reviewed and stable  Post vital signs: Reviewed and stable  Last Vitals:  Filed Vitals:   09/26/14 0700  BP: 140/72  Pulse: 85  Temp: 36.6 C  Resp: 18    Complications: No apparent anesthesia complications

## 2014-09-26 NOTE — Anesthesia Preprocedure Evaluation (Addendum)
Anesthesia Evaluation  Patient identified by MRN, date of birth, ID band Patient awake    Reviewed: Allergy & Precautions, NPO status , Patient's Chart, lab work & pertinent test results  Airway Mallampati: II  TM Distance: >3 FB Neck ROM: Full    Dental  (+) Dental Advisory Given, Teeth Intact   Pulmonary COPDformer smoker,    Pulmonary exam normal       Cardiovascular hypertension, Pt. on medications and Pt. on home beta blockers Normal cardiovascular exam ECHO 11/15 Study Conclusions  - Left ventricle: The cavity size was normal. Wall thickness was normal. Systolic function was normal. The estimated ejection fraction was in the range of 60% to 65%. Doppler parameters are consistent with abnormal left ventricular relaxation (grade 1 diastolic dysfunction). - Aortic valve: There was mild regurgitation. - Right ventricle: RV appears mildly dilated. and function appears mildly depressed. The cavity size was mildly dilated.     Neuro/Psych CVA, Residual Symptoms    GI/Hepatic hiatal hernia, GERD-  Medicated,  Endo/Other    Renal/GU      Musculoskeletal  (+) Arthritis -,   Abdominal   Peds  Hematology   Anesthesia Other Findings   Reproductive/Obstetrics                          Anesthesia Physical Anesthesia Plan  ASA: III  Anesthesia Plan: General   Post-op Pain Management:    Induction: Intravenous  Airway Management Planned: Oral ETT  Additional Equipment:   Intra-op Plan:   Post-operative Plan: Extubation in OR  Informed Consent: I have reviewed the patients History and Physical, chart, labs and discussed the procedure including the risks, benefits and alternatives for the proposed anesthesia with the patient or authorized representative who has indicated his/her understanding and acceptance.   Dental advisory given  Plan Discussed with: CRNA and  Surgeon  Anesthesia Plan Comments:       Anesthesia Quick Evaluation

## 2014-09-27 DIAGNOSIS — I5033 Acute on chronic diastolic (congestive) heart failure: Secondary | ICD-10-CM | POA: Diagnosis not present

## 2014-09-27 LAB — CBC
HCT: 35.9 % — ABNORMAL LOW (ref 39.0–52.0)
Hemoglobin: 11.9 g/dL — ABNORMAL LOW (ref 13.0–17.0)
MCH: 29.4 pg (ref 26.0–34.0)
MCHC: 33.1 g/dL (ref 30.0–36.0)
MCV: 88.6 fL (ref 78.0–100.0)
PLATELETS: 200 10*3/uL (ref 150–400)
RBC: 4.05 MIL/uL — ABNORMAL LOW (ref 4.22–5.81)
RDW: 15.5 % (ref 11.5–15.5)
WBC: 8.7 10*3/uL (ref 4.0–10.5)

## 2014-09-27 LAB — COMPREHENSIVE METABOLIC PANEL
ALT: 86 U/L — ABNORMAL HIGH (ref 17–63)
ANION GAP: 10 (ref 5–15)
AST: 78 U/L — ABNORMAL HIGH (ref 15–41)
Albumin: 2.4 g/dL — ABNORMAL LOW (ref 3.5–5.0)
Alkaline Phosphatase: 296 U/L — ABNORMAL HIGH (ref 38–126)
BUN: 10 mg/dL (ref 6–20)
CO2: 21 mmol/L — ABNORMAL LOW (ref 22–32)
CREATININE: 0.75 mg/dL (ref 0.61–1.24)
Calcium: 8.3 mg/dL — ABNORMAL LOW (ref 8.9–10.3)
Chloride: 103 mmol/L (ref 101–111)
GFR calc non Af Amer: >60 mL/min (ref >60)
GLUCOSE: 114 mg/dL — AB (ref 70–99)
Potassium: 3.9 mmol/L (ref 3.5–5.1)
SODIUM: 134 mmol/L — AB (ref 135–145)
TOTAL PROTEIN: 6 g/dL — AB (ref 6.5–8.1)
Total Bilirubin: 1.6 mg/dL — ABNORMAL HIGH (ref 0.3–1.2)

## 2014-09-27 MED ORDER — GABAPENTIN 100 MG PO CAPS
100.0000 mg | ORAL_CAPSULE | Freq: Three times a day (TID) | ORAL | Status: DC
  Administered 2014-09-27 – 2014-09-28 (×4): 100 mg via ORAL
  Filled 2014-09-27 (×6): qty 1

## 2014-09-27 MED ORDER — BACLOFEN 10 MG PO TABS
10.0000 mg | ORAL_TABLET | Freq: Four times a day (QID) | ORAL | Status: DC
  Administered 2014-09-27 – 2014-09-28 (×6): 10 mg via ORAL
  Filled 2014-09-27 (×8): qty 1

## 2014-09-27 MED ORDER — METOPROLOL TARTRATE 12.5 MG HALF TABLET
12.5000 mg | ORAL_TABLET | Freq: Two times a day (BID) | ORAL | Status: DC
  Administered 2014-09-27 – 2014-09-28 (×3): 12.5 mg via ORAL
  Filled 2014-09-27 (×4): qty 1

## 2014-09-27 MED ORDER — FUROSEMIDE 20 MG PO TABS
20.0000 mg | ORAL_TABLET | Freq: Every day | ORAL | Status: DC
  Administered 2014-09-27 – 2014-09-28 (×2): 20 mg via ORAL
  Filled 2014-09-27 (×2): qty 1

## 2014-09-27 MED ORDER — SIMVASTATIN 20 MG PO TABS
30.0000 mg | ORAL_TABLET | Freq: Every evening | ORAL | Status: DC
  Administered 2014-09-27: 30 mg via ORAL
  Filled 2014-09-27 (×2): qty 1

## 2014-09-27 MED ORDER — LORATADINE 10 MG PO TABS
10.0000 mg | ORAL_TABLET | Freq: Every day | ORAL | Status: DC | PRN
  Filled 2014-09-27: qty 1

## 2014-09-27 NOTE — Progress Notes (Signed)
Progress Note   Gregory Kerr QBV:[id number] DOB: 06/01/1940 DOA: 09/22/2014 PCP: Blanchie Serve, MD   Brief Narrative:   Gregory Kerr is an 74 y.o. male with PMH of diastolic CHF, prior stroke with pseudobulbar affect/left-sided hemiplegia who was admitted 09/22/14 with acute gallstone pancreatitis.  Assessment/Plan:   Principal Problem:   Pancreatitis, gallstone with choledocholithiasis and cholelithiasis with possible acute cholangitis  - MRCP performed which showed significant choledocholithiasis and cholecystitis. - General surgery consulted. ERCP done 09/25/14, and laparoscopic cholecystectomy done 09/26/14.    - No evidence of cholecystitis, empiric antibiotics discontinued. - Lipase dramatically improved, LFTs still elevated but slowly trending down.  Active Problems:    Hypokalemia - Resolved with potassium added to IV fluids.    CVA, old, hemiparesis/Pseudobulbar affect - Continue Aggrenox. Continue muscle relaxers for spasticity.    Essential hypertension - Resume Lasix and Lopressor.    Acute on Chronic diastolic heart failure - Became short of breath last night, given Lasix with resolution of symptoms. - IV fluids discontinued and the patient was started on his home dose of Lasix. - Respiratory status stable this morning.    DVT Prophylaxis - SCDs ordered.  Code Status: Full. Family Communication: Sister, [name], updated at the bedside 09/26/14. Disposition Plan: Back to [employer].   IV Access:    Peripheral IV   Procedures and diagnostic studies:   Dg Cholangiogram Operative  09/26/2014   CLINICAL DATA:  74 year old male with a history of laparoscopic cholecystectomy  EXAM: INTRAOPERATIVE CHOLANGIOGRAM  TECHNIQUE: Cholangiographic images from the C-arm fluoroscopic device were submitted for interpretation post-operatively. Please see the procedural report for the amount of contrast and the fluoroscopy time utilized.  COMPARISON:  ERCP  09/25/2014, CT 5 3 2016  FINDINGS: Surgical instruments project over the upper abdomen.  There is cannulation of the cystic duct/gallbladder neck, with antegrade infusion of contrast. Caliber of the extrahepatic ductal system within normal limits.  No large filling defect identified.  Free flow of contrast across the ampulla.  IMPRESSION: Intraoperative cholangiogram demonstrates extrahepatic biliary ducts of unremarkable caliber, with no large filling defect identified. Free flow of contrast across the ampulla.  Please refer to the dictated operative report for full details of intraoperative findings and procedure  Signed,  Dulcy Fanny. Earleen Newport, DO  [facility]  [facility]   Electronically Signed   By: Corrie Mckusick D.O.   On: 09/26/2014 11:24   US Abdomen Complete  09/22/2014   CLINICAL DATA:  Abdominal pain.  Patient has not been NPO.  EXAM: COMPLETE ABDOMINAL ULTRASOUND  COMPARISON:  CT 04/11/2012  FINDINGS: Gallbladder: Layering sludge with some small echogenic subcentimeter calculi. Borderline gallbladder wall thickening possibly secondary date incomplete distention. No pericholecystic fluid. Sonographer reports no sonographic Murphy's sign.  Common bile duct:  Normal in caliber, 65mm diameter.  Liver: Homogeneous in echotexture without focal lesion or intrahepatic bile duct dilatation.  IVC:  Negative  Pancreas: Visualized segments unremarkable, portions obscured by overlying bowel gas.  Spleen:  No focal lesion, craniocaudal 7.8cm in length.  Right Kidney:  No mass or hydronephrosis, 11.1cm in length.  Left Kidney:  No lesion or hydronephrosis, 10.6cm in length.  Abdominal aorta: Visualized segments unremarkable, portions obscured by overlying bowel gas.  IMPRESSION: 1. Cholelithiasis without other ultrasound evidence of cholecystitis or biliary obstruction.   Electronically Signed   By: Lucrezia Europe M.D.   On: 09/22/2014 09:09   Ct Abdomen Pelvis W  Contrast  09/22/2014   CLINICAL DATA:  Abdominal pain, left side abdominal pain, history of hiatal hernia  EXAM: CT ABDOMEN AND PELVIS WITH CONTRAST  TECHNIQUE: Multidetector CT imaging of the abdomen and pelvis was performed using the standard protocol following bolus administration of intravenous contrast.  CONTRAST:  62mL OMNIPAQUE IOHEXOL 300 MG/ML  SOLN  COMPARISON:  04/11/2012  FINDINGS: There is a large hiatal hernia measuring 10.5 x 8 cm with at least 2/3 rd of proximal stomach in left lower chest retrocardiac position. There is no evidence of acute gastric volvulus. No gastric outlet obstruction.  Mild hepatic fatty infiltration. No focal hepatic mass. Small gallstones are noted in gallbladder neck region the largest measures 6.5 mm. Borderline thickening of gallbladder wall up to 2.9 mm without evidence of pericholecystic fluid. Again noted mild pancreatic atrophy with fatty replacement. The spleen and adrenal glands are unremarkable. Again noted lobulated renal contour. No hydronephrosis or hydroureter. Cystic lesion along the uncinate process of the pancreas again noted measures 1.7 cm stable in size in appearance from prior exam. In a small portal caval lymph node measures 1.2 by 0.9 cm stable in size in appearance from prior exam.  Atherosclerotic calcifications of abdominal aorta and iliac arteries. No aortic aneurysm.  There is a cyst in midpole of the right kidney anterolateral aspect measures 1.2 cm. Delayed renal images shows bilateral renal symmetrical excretion. A cyst in upper pole of the right kidney posterior aspect measures 1 cm.  Bilateral visualized proximal ureter is unremarkable.  No small bowel obstruction. No ascites or free air. No adenopathy. Oral contrast material is noted in distal small bowel terminal ileum and right colon. There is no pericecal inflammation. The terminal ileum is unremarkable. The patient is status post appendectomy. There is some stool in distal left colon and  sigmoid colon. No evidence of colonic obstruction.  There is a right inguinal scrotal canal hernia containing fat and a loop of terminal small bowel measures 4.4 cm without evidence of small bowel obstruction. There is a left inguinal scrotal canal hernia containing fat measures 3.5 cm without evidence of acute complication. Prostate gland calcifications are noted. Mild enlarged prostate gland measures 4.9 by 3.5 cm. The urinary bladder is unremarkable.  IMPRESSION: 1. Again noted large hiatal hernia with at least 2/3 of proximal stomach in left lower chest retrocardiac position. There is no evidence of gastric volvulus or gastric outlet obstruction. 2. No small bowel or colonic obstruction. 3. Again noted gallstones within gallbladder. Borderline thickening of gallbladder wall up to 2.9 mm. No evidence of pericholecystic fluid. 4. Again noted atrophic pancreas with partial fatty replacement. Stable cystic lesion along the uncinate process of the pancreas. 5. No small bowel obstruction. Again noted right inguinal scrotal hernia containing small bowel without evidence of acute complication or small bowel obstruction. Again noted a left inguinal scrotal canal hernia containing fat without evidence of acute complication. 6. Again noted mild enlarged prostate gland. 7. Mild hepatic fatty infiltration. 8. No hydronephrosis or hydroureter.  Right renal cysts are noted   Electronically Signed   By: Lahoma Crocker M.D.   On: 09/22/2014 09:28   Dg Chest Port 1 View  09/26/2014   CLINICAL DATA:  Postop check. Status post laparoscopic cholecystectomy.  EXAM: PORTABLE CHEST - 1 VIEW  COMPARISON:  09/22/2014  FINDINGS: Lung volumes are low. Large hiatal hernia projecting behind the cardiac silhouette. There is opacity at the lung bases most likely atelectasis. No convincing pulmonary edema.  No pneumothorax.  Cardiac  silhouette is normal in size.  Bony thorax is grossly intact.  IMPRESSION: Limited exam due to low lung volumes. No  convincing pneumonia or pulmonary edema. There is basilar atelectasis, greater on the left adjacent to a large hiatal hernia.   Electronically Signed   By: Lajean Manes M.D.   On: 09/26/2014 13:21   Dg Chest Port 1 View  09/22/2014   CLINICAL DATA:  Dyspnea  EXAM: PORTABLE CHEST - 1 VIEW  COMPARISON:  04/03/2014  FINDINGS: There is a large hiatal hernia. There is interstitial fluid or thickening a there is mild vascular fullness. This likely represents congestive heart failure. No airspace consolidation is evident. There is no large effusion.  IMPRESSION: Congestive heart failure   Electronically Signed   By: Andreas Newport M.D.   On: 09/22/2014 06:13   Dg Ercp Biliary & Pancreatic Ducts  09/25/2014   CLINICAL DATA:  Choledocholithiasis.  EXAM: ERCP  TECHNIQUE: Multiple spot images obtained with the fluoroscopic device and submitted for interpretation post-procedure.  COMPARISON:  MRCP on 09/23/2014  FINDINGS: Imaging obtained with a C-arm at the time of the ERCP procedure demonstrates cannulation of the common bile duct with contrast injection demonstrating multiple filling defects in the common bile duct consistent with calculi. A balloon sweep maneuver was performed with stone extraction.  IMPRESSION: Imaging submitted during ERCP confirms choledocholithiasis. Balloon sweep stone extraction was performed.  These images were submitted for radiologic interpretation only. Please see the procedural report for the amount of contrast and the fluoroscopy time utilized.   Electronically Signed   By: Aletta Edouard M.D.   On: 09/25/2014 09:04   Mr Jeananne Mystery Schrupp W/wo Cm/mrcp  09/23/2014   CLINICAL DATA:  74 year old male with mid epigastric pain radiating to his back.  EXAM: MRI ABDOMEN WITHOUT AND WITH CONTRAST (INCLUDING MRCP)  TECHNIQUE: Multiplanar multisequence MR imaging of the abdomen was performed both before and after the administration of intravenous contrast. Heavily T2-weighted images of the biliary and  pancreatic ducts were obtained, and three-dimensional MRCP images were rendered by post processing.  CONTRAST:  58mL MULTIHANCE GADOBENATE DIMEGLUMINE 529 MG/ML IV SOLN  COMPARISON:  CT the abdomen and pelvis 09/22/2014.  FINDINGS: Comment: Poor image quality throughout the examination secondary to extensive patient respiratory motion.  Lower chest:  Moderate to large hiatal hernia.  Hepatobiliary: Several tiny filling defects within the gallbladder, compatible with gallstones. Gallbladder does not appear distended, gallbladder wall thickness is normal, there is no pericholecystic fluid to suggest acute cholecystitis at this time. However, there several small filling defects within the mid to distal common bile duct, compatible with ductal stones. This is best appreciated on the coronal T2 weighted sequence on images 23 and 24 of series 4. At this time, these stones appeared to be nonobstructive, as the common bile duct measures up to 6-7 mm (within normal limits for the patient's age), and there is no significant intrahepatic biliary ductal dilatation. No discrete cystic or solid hepatic lesions.  Pancreas: Well-defined 11 x 15 mm T1 hypointense, T2 hyperintense, nonenhancing lesion in the uncinate process of the pancreas, similar to slightly smaller than prior studies dating back to at least 04/11/2012, presumably a benign lesion such as a small pancreatic pseudocyst. A similar appearing 1 cm lesion in the tail of the pancreas is also noted, similar to recent prior study 09/22/2014, but cannot be confirmed on more remote prior examinations secondary to lack of IV contrast. No pancreatic ductal dilatation. No pancreatic or peripancreatic inflammatory changes.  Spleen:  Unremarkable.  Adrenals/Urinary Tract: There are small lesions in the kidneys bilaterally which are T1 hypointense, T2 hyperintense, and do not appear to enhance (although accurate assessment is limited by extensive patient motion), favored to  represent tiny cysts. Bilateral adrenal glands are normal in appearance.  Stomach/Bowel: Moderate to large hiatal hernia. Otherwise, unremarkable.  Vascular/Lymphatic: No aneurysm identified in the visualized abdominal vasculature. No lymphadenopathy.  Other: No significant volume of ascites in the visualized portions of the peritoneal cavity.  Musculoskeletal: No aggressive appearing osseous lesions are noted in the visualized skeleton.  IMPRESSION: 1. Choledocholithiasis and cholelithiasis, without signs of frank biliary tract obstruction or acute cholecystitis at this time. Surgical consultation is recommended. 2. 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 There is also a 1 cm simple appearing cystic lesion in the tail of the pancreas, also favored to be a benign lesion, however, this cannot be confirmed on remote prior examinations. Repeat MRI of the abdomen with and without IV gadolinium in 1 year is recommended to ensure continued stability of this lesion. This recommendation follows ACR consensus guidelines: Managing Incidental Findings on Abdominal CT: White Paper of the ACR Incidental Findings Committee. J Am Coll Radiol 2010;7:754-773. 3. At the time of follow-up abdominal MRI, attention should be directed to the small cystic lesions in the kidneys to ensure their stability is well. These are favored to represent simple cysts, however, today's study was limited by considerable respiratory motion. The patient should be counseled prior to the followup examination to maintain strict breath holding during the examination to ensure better image quality. 4. Moderate to large hiatal hernia.   Electronically Signed   By: Vinnie Langton M.D.   On: 09/23/2014 09:28     Medical Consultants:    Dr. Clarene Essex, GI  Dr. Coralie Keens,  Surgery  Anti-Infectives:    Aztreonam 09/22/14--->09/26/14  Flagyl 09/22/14--->09/26/14  Subjective:   Heywood Bene has some RUQ discomfort. Occasional cough, says he cannot get the mucous up. No N/V.  No dyspnea.  Objective:    Filed Vitals:   09/26/14 1345 09/26/14 1414 09/26/14 2153 09/27/14 0512  BP: 125/68 132/79 116/75 141/83  Pulse: 107 107 113 99  Temp:  97.6 F (36.4 C) 99.7 F (37.6 C) 98.4 F (36.9 C)  TempSrc:  Oral Oral Oral  Resp: 21 20 18 18   Height:      Weight:    74 kg (163 lb 2.3 oz)  SpO2: 91% 93% 91% 95%    Intake/Output Summary (Last 24 hours) at 09/27/14 8453 Last data filed at 09/27/14 0100  Gross per 24 hour  Intake    500 ml  Output   1475 ml  Net   -975 ml    Exam: Gen:  NAD Cardiovascular:  RRR, No M/R/G Respiratory:  Lungs clear Gastrointestinal:  Abdomen soft, NT/ND, + BS Extremities:  No C/E/C   Data Reviewed:    Labs: Basic Metabolic Panel:  Recent Labs Lab 09/22/14 1248 09/23/14 0605 09/24/14 0611 09/26/14 0721 09/27/14 0620  NA 136 137 138 138 134*  K 3.7 3.4* 4.1 4.7 3.9  CL 105 107 110 112* 103  CO2 21* 19* 19* 20* 21*  GLUCOSE 105* 119* 112* 77 114*  BUN 14 9 9 8 10   CREATININE 0.86 0.68 0.63 0.69 0.75  CALCIUM 8.3* 8.5* 8.5* 7.8* 8.3*  GFR Estimated Creatinine Clearance: 84.7 mL/min (by C-G formula based on Cr of 0.75). Liver Function Tests:  Recent Labs Lab 09/22/14 1248 09/23/14 0605 09/24/14 0611 09/26/14 0721 09/27/14 0620  AST 94* 56* 38 63* 78*  ALT 129* 101* 75* 71* 86*  ALKPHOS 420* 397* 365* 284* 296*  BILITOT 4.0* 5.6* 1.7* 2.2* 1.6*  PROT 6.8 6.5 6.1* 5.5* 6.0*  ALBUMIN 2.8* 2.6* 2.6* 2.2* 2.4*    Recent Labs Lab 09/22/14 0543 09/23/14 0605  LIPASE 208* 18*   CBC:  Recent Labs Lab 09/22/14 0543 09/23/14 0605 09/26/14 0721 09/27/14 0620  WBC 12.1* 6.4 5.1 8.7  NEUTROABS 10.7*  --  3.5  --   HGB 14.3 13.3 11.5* 11.9*  HCT 42.3 39.8 35.8* 35.9*  MCV 87.0 87.3 89.7 88.6   PLT 216 206 197 200   Cardiac Enzymes:  Recent Labs Lab 09/22/14 0543  TROPONINI <0.03   BNP (last 3 results)  Recent Labs  04/03/14 0742  PROBNP 205.3*   Sepsis Labs:  Recent Labs Lab 09/22/14 0543 09/22/14 1000 09/22/14 1010 09/22/14 1248 09/22/14 1257 09/23/14 0605 09/26/14 0721 09/27/14 0620  WBC 12.1*  --   --   --   --  6.4 5.1 8.7  LATICACIDVEN  --  1.3 1.17 1.2 1.19  --   --   --    Microbiology Recent Results (from the past 240 hour(s))  MRSA PCR Screening     Status: None   Collection Time: 09/25/14  4:09 PM  Result Value Ref Range Status   MRSA by PCR NEGATIVE NEGATIVE Final    Comment:        The GeneXpert MRSA Assay (FDA approved for NASAL specimens only), is one component of a comprehensive MRSA colonization surveillance program. It is not intended to diagnose MRSA infection nor to guide or monitor treatment for MRSA infections.   Surgical pcr screen     Status: None   Collection Time: 09/25/14  7:55 PM  Result Value Ref Range Status   MRSA, PCR NEGATIVE NEGATIVE Final   Staphylococcus aureus NEGATIVE NEGATIVE Final    Comment:        The Xpert SA Assay (FDA approved for NASAL specimens in patients over 93 years of age), is one component of a comprehensive surveillance program.  Test performance has been validated by [facility] for patients greater than or equal to 86 year old. It is not intended to diagnose infection nor to guide or monitor treatment.      Medications:   . guaiFENesin  600 mg Oral BID   Continuous Infusions: . 0.9 % NaCl with KCl 20 mEq / L 10 mL/hr at 09/27/14 0200    Time spent: 25 minutes.    LOS: 5 days   [facility] Pager [phone]. If unable to reach me by pager, please call my cell phone at [phone].  *Please refer to [email or web address], password [email or web address] to get updated schedule on who will round on this patient, as hospitalists switch teams weekly. If 7PM-7AM, please contact  night-coverage at [email or web address], password [email or web address] for any overnight needs.  09/27/2014, 8:22 AM

## 2014-09-27 NOTE — Progress Notes (Signed)
No epigastric pain. Liver chemistries remain stable, but mildly elevated (I do not have baseline liver chemistries for comparison).   The patient reports recurrence of a problem he has had for a long time intermittently, namely, sort of a discomfort when he tries to take a deep breath. Given the chronicity of that symptom, I think it is completely unrelated to his ERCP and his choledocholithiasis.  We will sign off at this time, but please call if we can be of further assistance in this patient's care.   I have left a message for Dr.Magod making him aware of the patient's persistent mild transaminase elevation, in case he wants to do any monitoring of that, or further investigation.  Cleotis Nipper, M.D. Pager [phone] If no answer or after 5 PM call [phone]

## 2014-09-27 NOTE — Progress Notes (Signed)
1 Day Post-Op  Subjective: tol diet, pain controlled  Objective: Vital signs in last 24 hours: Temp:  [97.5 F (36.4 C)-99.7 F (37.6 C)] 98.4 F (36.9 C) (05/08 0512) Pulse Rate:  [74-113] 99 (05/08 0512) Resp:  [12-22] 18 (05/08 0512) BP: (116-141)/(60-83) 141/83 mmHg (05/08 0512) SpO2:  [90 %-96 %] 95 % (05/08 0512) Weight:  [74 kg (163 lb 2.3 oz)] 74 kg (163 lb 2.3 oz) (05/08 0512) Last BM Date: 09/26/14  Intake/Output from previous day: 05/07 0701 - 05/08 0700 In: 500 [I.V.:500] Out: 1475 [Urine:1425; Blood:50] Intake/Output this shift:    abd soft approp tender dressings dry   Lab Results:   Recent Labs  09/26/14 0721 09/27/14 0620  WBC 5.1 8.7  HGB 11.5* 11.9*  HCT 35.8* 35.9*  PLT 197 200   BMET  Recent Labs  09/26/14 0721 09/27/14 0620  NA 138 134*  K 4.7 3.9  CL 112* 103  CO2 20* 21*  GLUCOSE 77 114*  BUN 8 10  CREATININE 0.69 0.75  CALCIUM 7.8* 8.3*   PT/INR No results for input(s): LABPROT, INR in the last 72 hours. ABG  Recent Labs  09/26/14 1815  PHART 7.363  HCO3 17.8*    Studies/Results: Dg Cholangiogram Operative  09/26/2014   CLINICAL DATA:  74 year old male with a history of laparoscopic cholecystectomy  EXAM: INTRAOPERATIVE CHOLANGIOGRAM  TECHNIQUE: Cholangiographic images from the C-arm fluoroscopic device were submitted for interpretation post-operatively. Please see the procedural report for the amount of contrast and the fluoroscopy time utilized.  COMPARISON:  ERCP 09/25/2014, CT 5 3 2016  FINDINGS: Surgical instruments project over the upper abdomen.  There is cannulation of the cystic duct/gallbladder neck, with antegrade infusion of contrast. Caliber of the extrahepatic ductal system within normal limits.  No large filling defect identified.  Free flow of contrast across the ampulla.  IMPRESSION: Intraoperative cholangiogram demonstrates extrahepatic biliary ducts of unremarkable caliber, with no large filling defect  identified. Free flow of contrast across the ampulla.  Please refer to the dictated operative report for full details of intraoperative findings and procedure  Signed,  Dulcy Fanny. Earleen Newport, DO  [facility]  [facility]   Electronically Signed   By: Corrie Mckusick D.O.   On: 09/26/2014 11:24   Dg Chest Port 1 View  09/26/2014   CLINICAL DATA:  Postop check. Status post laparoscopic cholecystectomy.  EXAM: PORTABLE CHEST - 1 VIEW  COMPARISON:  09/22/2014  FINDINGS: Lung volumes are low. Large hiatal hernia projecting behind the cardiac silhouette. There is opacity at the lung bases most likely atelectasis. No convincing pulmonary edema.  No pneumothorax.  Cardiac silhouette is normal in size.  Bony thorax is grossly intact.  IMPRESSION: Limited exam due to low lung volumes. No convincing pneumonia or pulmonary edema. There is basilar atelectasis, greater on the left adjacent to a large hiatal hernia.   Electronically Signed   By: Lajean Manes M.D.   On: 09/26/2014 13:21    Anti-infectives: Anti-infectives    Start     Dose/Rate Route Frequency Ordered Stop   09/26/14 0600  ciprofloxacin (CIPRO) IVPB 400 mg    Comments:  Pharmacy may adjust dosing strength, interval, or rate of medication as needed for optimal therapy for the patient Send with patient on call to the OR.  Anesthesia to complete antibiotic administration <18min prior to incision per [facility].   400 mg 200 mL/hr over 60 Minutes Intravenous On call to O.R. 09/25/14 1008 09/26/14  1001   09/25/14 1000  ciprofloxacin (CIPRO) IVPB 400 mg  Status:  Discontinued     400 mg 200 mL/hr over 60 Minutes Intravenous Every 12 hours 09/25/14 0804 09/25/14 0805   09/25/14 0815  ciprofloxacin (CIPRO) IVPB 400 mg     400 mg 200 mL/hr over 60 Minutes Intravenous  Once 09/25/14 0805 09/25/14 0750   09/22/14 1800  metroNIDAZOLE (FLAGYL) IVPB 500 mg  Status:  Discontinued     500 mg 100 mL/hr over 60 Minutes  Intravenous Every 8 hours 09/22/14 1542 09/23/14 1709   09/22/14 1800  aztreonam (AZACTAM) 1 g in dextrose 5 % 50 mL IVPB  Status:  Discontinued     1 g 100 mL/hr over 30 Minutes Intravenous Every 8 hours 09/22/14 1322 09/23/14 1709   09/22/14 1000  ciprofloxacin (CIPRO) IVPB 400 mg     400 mg 200 mL/hr over 60 Minutes Intravenous  Once 09/22/14 0951 09/22/14 1154   09/22/14 1000  metroNIDAZOLE (FLAGYL) IVPB 500 mg     500 mg 100 mL/hr over 60 Minutes Intravenous  Once 09/22/14 0951 09/22/14 1143      Assessment/Plan: POD 1 lap chole  Can advance diet as tolerated Dc when medically able  [facility] 09/27/2014

## 2014-09-27 NOTE — Progress Notes (Signed)
Pt denies pain or discomfort to lower abd, informed and  educ on bladder scan, urine retention, pt stated "I feel fine, I do feel distended, was incont of urine x 3". Pt using urinal prn.

## 2014-09-28 ENCOUNTER — Encounter (HOSPITAL_COMMUNITY): Payer: Self-pay | Admitting: Gastroenterology

## 2014-09-28 DIAGNOSIS — I5033 Acute on chronic diastolic (congestive) heart failure: Secondary | ICD-10-CM

## 2014-09-28 MED ORDER — ENSURE PUDDING PO PUDG
1.0000 | Freq: Three times a day (TID) | ORAL | Status: DC
  Filled 2014-09-28 (×6): qty 1

## 2014-09-28 MED ORDER — OXYCODONE-ACETAMINOPHEN 5-325 MG PO TABS: 1.0000 | ORAL_TABLET | ORAL | Status: DC | PRN

## 2014-09-28 NOTE — Progress Notes (Signed)
Physical Therapy Treatment Patient Details Name: Gregory Kerr MRN: [id number] DOB: 02-Feb-1941 Today's Date: 09/28/2014    History of Present Illness Patient is a 74 y/o male who presents with abdominal pain. Admitted with Gallstone pancreatitis/choledocholithiasis. GI plans for ERCP tomorrow morning (5/5). Possible OR over the weekend. PMH of HTN, HLD, PNA, CVA with residual left sided deficits.    PT Comments    Pt making steady progress.  Follow Up Recommendations  SNF     Equipment Recommendations  None recommended by PT    Recommendations for Other Services       Precautions / Restrictions Precautions Precautions: Fall Precaution Comments: L hemiplegia, increased tone. Required Braces or Orthoses: Other Brace/Splint Other Brace/Splint: Lt  AFO Restrictions Weight Bearing Restrictions: No    Mobility  Bed Mobility Overal bed mobility: Needs Assistance Bed Mobility: Supine to Sit     Supine to sit: Mod assist     General bed mobility comments: Assist to bring trunk up  Transfers Overall transfer level: Needs assistance Equipment used: Ambulation equipment used Charlaine Dalton) Transfers: Sit to/from [employer] to Stand: Mod assist Stand pivot transfers: Mod assist (with Stedy)       General transfer comment: assist to bring hips up and verbal cues to stand more erect  Ambulation/Gait                 Stairs            Wheelchair Mobility    Modified Rankin (Stroke Patients Only)       Balance Overall balance assessment: Needs assistance Sitting-balance support: Single extremity supported Sitting balance-Leahy Scale: Poor Sitting balance - Comments: supervision to min A with RUE support on EOB Postural control: Right lateral lean   Standing balance-Leahy Scale: Poor Standing balance comment: Stedy and mod A                    Cognition Arousal/Alertness: Awake/alert Behavior During Therapy: WFL for tasks  assessed/performed Overall Cognitive Status: Within Functional Limits for tasks assessed                      Exercises      General Comments        Pertinent Vitals/Pain Pain Assessment: Faces Faces Pain Scale: Hurts little more Pain Location: abdomen Pain Descriptors / Indicators: Pressure Pain Intervention(s): Limited activity within patient's tolerance;Repositioned    Home Living                      Prior Function            PT Goals (current goals can now be found in the care plan section) Acute Rehab PT Goals Patient Stated Goal: none stated PT Goal Formulation: With patient Time For Goal Achievement: 10/12/14 Potential to Achieve Goals: Good Progress towards PT goals: Goals met and updated - see care plan    Frequency  Min 2X/week    PT Plan Current plan remains appropriate    Co-evaluation             End of Session Equipment Utilized During Treatment: Gait belt;Other (comment) Charlaine Dalton) Activity Tolerance: Patient tolerated treatment well Patient left: in chair;with call bell/phone within reach;with chair alarm set     Time: 4403-4742 PT Time Calculation (min) (ACUTE ONLY): 27 min  Charges:  $Therapeutic Activity: 23-37 mins  G Codes:      Rayma Hegg 09/28/2014, 1:12 PM  [place]

## 2014-09-28 NOTE — Plan of Care (Signed)
Problem: Phase II Progression Outcomes Goal: Progress activity as tolerated unless otherwise ordered Outcome: Adequate for Discharge Baseline paralysis.

## 2014-09-28 NOTE — Discharge Summary (Signed)
Physician Discharge Summary  Gregory Kerr QIO:[id number] DOB: [birth year] DOA: 09/22/2014  PCP: Blanchie Serve, MD  Admit date: 09/22/2014 Discharge date: 09/28/2014   Recommendations for Outpatient Follow-Up:   1. Monitor LFTs until back to baseline.   Discharge Diagnosis:   Principal Problem:    Pancreatitis, gallstone status post ERCP and laparoscopic cholecystectomy Active Problems:    Pseudobulbar affect    Essential hypertension    CVA, old, hemiparesis    Acute cholangitis    Gallstone pancreatitis    Hypokalemia    Acute on chronic diastolic CHF (congestive heart failure)   Discharge disposition:   SNF: [facility]  Discharge Condition: Improved.  Diet recommendation: Low sodium, heart healthy.   Wound care: Monitor for signs of infection.  Keep surgical wound clean.   History of Present Illness:   Gregory Kerr is an 74 y.o. male with PMH of diastolic CHF, prior stroke with pseudobulbar affect/left-sided hemiplegia who was admitted 09/22/14 with acute gallstone pancreatitis.  Hospital Course by Problem:   Principal Problem:  Pancreatitis, gallstone with choledocholithiasis and cholelithiasis with possible acute cholangitis  - MRCP performed which showed significant choledocholithiasis and cholecystitis. - General surgery consulted. ERCP done 09/25/14, and laparoscopic cholecystectomy done 09/26/14.  - No evidence of cholecystitis, empiric antibiotics discontinued. - Lipase dramatically improved, LFTs still elevated but slowly trending down.  Active Problems:   Hypokalemia - Resolved with potassium added to IV fluids.   CVA, old, hemiparesis/Pseudobulbar affect - Continue Aggrenox. Continue muscle relaxers for spasticity.   Essential hypertension - Resume Lasix and Lopressor.   Acute on Chronic diastolic heart failure - Became short of breath 09/27/14, given Lasix with resolution of symptoms. - IV fluids discontinued and the patient was  started on his home dose of Lasix. - Respiratory status stable at D/C.    Medical Consultants:    Dr. Clarene Essex, GI  Dr. Coralie Keens, Surgery   Discharge Exam:   Filed Vitals:   09/28/14 0601  BP: 106/58  Pulse:   Temp: 98.3 F (36.8 C)  Resp: 16   Filed Vitals:   09/27/14 1302 09/27/14 2207 09/28/14 0601 09/28/14 0602  BP: 124/71 125/65 106/58   Pulse:  85    Temp: 98.3 F (36.8 C) 98.8 F (37.1 C) 98.3 F (36.8 C)   TempSrc: Oral Oral Oral   Resp: 16 20 16    Height:      Weight:    71.986 kg (158 lb 11.2 oz)  SpO2: 96% 97% 98%     Gen:  NAD Cardiovascular:  RRR, No M/R/G Respiratory: Lungs CTAB Gastrointestinal: Abdomen soft, NT/ND with normal active bowel sounds. Extremities: No C/E/C   The results of significant diagnostics from this hospitalization (including imaging, microbiology, ancillary and laboratory) are listed below for reference.     Procedures and Diagnostic Studies:   Dg Cholangiogram Operative  09/26/2014   CLINICAL DATA:  74 year old male with a history of laparoscopic cholecystectomy  EXAM: INTRAOPERATIVE CHOLANGIOGRAM  TECHNIQUE: Cholangiographic images from the C-arm fluoroscopic device were submitted for interpretation post-operatively. Please see the procedural report for the amount of contrast and the fluoroscopy time utilized.  COMPARISON:  ERCP 09/25/2014, CT 5 3 2016  FINDINGS: Surgical instruments project over the upper abdomen.  There is cannulation of the cystic duct/gallbladder neck, with antegrade infusion of contrast. Caliber of the extrahepatic ductal system within normal limits.  No large filling defect identified.  Free flow of contrast across the ampulla.  IMPRESSION: Intraoperative cholangiogram demonstrates  extrahepatic biliary ducts of unremarkable caliber, with no large filling defect identified. Free flow of contrast across the ampulla.  Please refer to the dictated operative report for full details of intraoperative  findings and procedure  Signed,  Dulcy Fanny. Earleen Newport, DO  [facility]  [facility]   Electronically Signed   By: Corrie Mckusick D.O.   On: 09/26/2014 11:24   US Abdomen Complete  09/22/2014   CLINICAL DATA:  Abdominal pain.  Patient has not been NPO.  EXAM: COMPLETE ABDOMINAL ULTRASOUND  COMPARISON:  CT 04/11/2012  FINDINGS: Gallbladder: Layering sludge with some small echogenic subcentimeter calculi. Borderline gallbladder wall thickening possibly secondary date incomplete distention. No pericholecystic fluid. Sonographer reports no sonographic Murphy's sign.  Common bile duct:  Normal in caliber, 2mm diameter.  Liver: Homogeneous in echotexture without focal lesion or intrahepatic bile duct dilatation.  IVC:  Negative  Pancreas: Visualized segments unremarkable, portions obscured by overlying bowel gas.  Spleen:  No focal lesion, craniocaudal 7.8cm in length.  Right Kidney:  No mass or hydronephrosis, 11.1cm in length.  Left Kidney:  No lesion or hydronephrosis, 10.6cm in length.  Abdominal aorta: Visualized segments unremarkable, portions obscured by overlying bowel gas.  IMPRESSION: 1. Cholelithiasis without other ultrasound evidence of cholecystitis or biliary obstruction.   Electronically Signed   By: Lucrezia Europe M.D.   On: 09/22/2014 09:09   Ct Abdomen Pelvis W Contrast  09/22/2014   CLINICAL DATA:  Abdominal pain, left side abdominal pain, history of hiatal hernia  EXAM: CT ABDOMEN AND PELVIS WITH CONTRAST  TECHNIQUE: Multidetector CT imaging of the abdomen and pelvis was performed using the standard protocol following bolus administration of intravenous contrast.  CONTRAST:  21mL OMNIPAQUE IOHEXOL 300 MG/ML  SOLN  COMPARISON:  04/11/2012  FINDINGS: There is a large hiatal hernia measuring 10.5 x 8 cm with at least 2/3 rd of proximal stomach in left lower chest retrocardiac position. There is no evidence of acute gastric volvulus. No gastric outlet obstruction.   Mild hepatic fatty infiltration. No focal hepatic mass. Small gallstones are noted in gallbladder neck region the largest measures 6.5 mm. Borderline thickening of gallbladder wall up to 2.9 mm without evidence of pericholecystic fluid. Again noted mild pancreatic atrophy with fatty replacement. The spleen and adrenal glands are unremarkable. Again noted lobulated renal contour. No hydronephrosis or hydroureter. Cystic lesion along the uncinate process of the pancreas again noted measures 1.7 cm stable in size in appearance from prior exam. In a small portal caval lymph node measures 1.2 by 0.9 cm stable in size in appearance from prior exam.  Atherosclerotic calcifications of abdominal aorta and iliac arteries. No aortic aneurysm.  There is a cyst in midpole of the right kidney anterolateral aspect measures 1.2 cm. Delayed renal images shows bilateral renal symmetrical excretion. A cyst in upper pole of the right kidney posterior aspect measures 1 cm.  Bilateral visualized proximal ureter is unremarkable.  No small bowel obstruction. No ascites or free air. No adenopathy. Oral contrast material is noted in distal small bowel terminal ileum and right colon. There is no pericecal inflammation. The terminal ileum is unremarkable. The patient is status post appendectomy. There is some stool in distal left colon and sigmoid colon. No evidence of colonic obstruction.  There is a right inguinal scrotal canal hernia containing fat and a loop of terminal small bowel measures 4.4 cm without evidence of small bowel obstruction. There is a left inguinal scrotal canal hernia containing fat  measures 3.5 cm without evidence of acute complication. Prostate gland calcifications are noted. Mild enlarged prostate gland measures 4.9 by 3.5 cm. The urinary bladder is unremarkable.  IMPRESSION: 1. Again noted large hiatal hernia with at least 2/3 of proximal stomach in left lower chest retrocardiac position. There is no evidence of  gastric volvulus or gastric outlet obstruction. 2. No small bowel or colonic obstruction. 3. Again noted gallstones within gallbladder. Borderline thickening of gallbladder wall up to 2.9 mm. No evidence of pericholecystic fluid. 4. Again noted atrophic pancreas with partial fatty replacement. Stable cystic lesion along the uncinate process of the pancreas. 5. No small bowel obstruction. Again noted right inguinal scrotal hernia containing small bowel without evidence of acute complication or small bowel obstruction. Again noted a left inguinal scrotal canal hernia containing fat without evidence of acute complication. 6. Again noted mild enlarged prostate gland. 7. Mild hepatic fatty infiltration. 8. No hydronephrosis or hydroureter.  Right renal cysts are noted   Electronically Signed   By: Lahoma Crocker M.D.   On: 09/22/2014 09:28   Dg Chest Port 1 View  09/26/2014   CLINICAL DATA:  Postop check. Status post laparoscopic cholecystectomy.  EXAM: PORTABLE CHEST - 1 VIEW  COMPARISON:  09/22/2014  FINDINGS: Lung volumes are low. Large hiatal hernia projecting behind the cardiac silhouette. There is opacity at the lung bases most likely atelectasis. No convincing pulmonary edema.  No pneumothorax.  Cardiac silhouette is normal in size.  Bony thorax is grossly intact.  IMPRESSION: Limited exam due to low lung volumes. No convincing pneumonia or pulmonary edema. There is basilar atelectasis, greater on the left adjacent to a large hiatal hernia.   Electronically Signed   By: Lajean Manes M.D.   On: 09/26/2014 13:21   Dg Chest Port 1 View  09/22/2014   CLINICAL DATA:  Dyspnea  EXAM: PORTABLE CHEST - 1 VIEW  COMPARISON:  04/03/2014  FINDINGS: There is a large hiatal hernia. There is interstitial fluid or thickening a there is mild vascular fullness. This likely represents congestive heart failure. No airspace consolidation is evident. There is no large effusion.  IMPRESSION: Congestive heart failure   Electronically  Signed   By: Andreas Newport M.D.   On: 09/22/2014 06:13   Dg Ercp Biliary & Pancreatic Ducts  09/25/2014   CLINICAL DATA:  Choledocholithiasis.  EXAM: ERCP  TECHNIQUE: Multiple spot images obtained with the fluoroscopic device and submitted for interpretation post-procedure.  COMPARISON:  MRCP on 09/23/2014  FINDINGS: Imaging obtained with a C-arm at the time of the ERCP procedure demonstrates cannulation of the common bile duct with contrast injection demonstrating multiple filling defects in the common bile duct consistent with calculi. A balloon sweep maneuver was performed with stone extraction.  IMPRESSION: Imaging submitted during ERCP confirms choledocholithiasis. Balloon sweep stone extraction was performed.  These images were submitted for radiologic interpretation only. Please see the procedural report for the amount of contrast and the fluoroscopy time utilized.   Electronically Signed   By: Aletta Edouard M.D.   On: 09/25/2014 09:04   Mr Jeananne Vong Garringer W/wo Cm/mrcp  09/23/2014   CLINICAL DATA:  74 year old male with mid epigastric pain radiating to his back.  EXAM: MRI ABDOMEN WITHOUT AND WITH CONTRAST (INCLUDING MRCP)  TECHNIQUE: Multiplanar multisequence MR imaging of the abdomen was performed both before and after the administration of intravenous contrast. Heavily T2-weighted images of the biliary and pancreatic ducts were obtained, and three-dimensional MRCP images were rendered by post processing.  CONTRAST:  48mL MULTIHANCE GADOBENATE DIMEGLUMINE 529 MG/ML IV SOLN  COMPARISON:  CT the abdomen and pelvis 09/22/2014.  FINDINGS: Comment: Poor image quality throughout the examination secondary to extensive patient respiratory motion.  Lower chest:  Moderate to large hiatal hernia.  Hepatobiliary: Several tiny filling defects within the gallbladder, compatible with gallstones. Gallbladder does not appear distended, gallbladder wall thickness is normal, there is no pericholecystic fluid to suggest acute  cholecystitis at this time. However, there several small filling defects within the mid to distal common bile duct, compatible with ductal stones. This is best appreciated on the coronal T2 weighted sequence on images 23 and 24 of series 4. At this time, these stones appeared to be nonobstructive, as the common bile duct measures up to 6-7 mm (within normal limits for the patient's age), and there is no significant intrahepatic biliary ductal dilatation. No discrete cystic or solid hepatic lesions.  Pancreas: Well-defined 11 x 15 mm T1 hypointense, T2 hyperintense, nonenhancing lesion in the uncinate process of the pancreas, similar to slightly smaller than prior studies dating back to at least 04/11/2012, presumably a benign lesion such as a small pancreatic pseudocyst. A similar appearing 1 cm lesion in the tail of the pancreas is also noted, similar to recent prior study 09/22/2014, but cannot be confirmed on more remote prior examinations secondary to lack of IV contrast. No pancreatic ductal dilatation. No pancreatic or peripancreatic inflammatory changes.  Spleen: Unremarkable.  Adrenals/Urinary Tract: There are small lesions in the kidneys bilaterally which are T1 hypointense, T2 hyperintense, and do not appear to enhance (although accurate assessment is limited by extensive patient motion), favored to represent tiny cysts. Bilateral adrenal glands are normal in appearance.  Stomach/Bowel: Moderate to large hiatal hernia. Otherwise, unremarkable.  Vascular/Lymphatic: No aneurysm identified in the visualized abdominal vasculature. No lymphadenopathy.  Other: No significant volume of ascites in the visualized portions of the peritoneal cavity.  Musculoskeletal: No aggressive appearing osseous lesions are noted in the visualized skeleton.  IMPRESSION: 1. Choledocholithiasis and cholelithiasis, without signs of frank biliary tract obstruction or acute cholecystitis at this time. Surgical consultation is  recommended. 2. 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 There is also a 1 cm simple appearing cystic lesion in the tail of the pancreas, also favored to be a benign lesion, however, this cannot be confirmed on remote prior examinations. Repeat MRI of the abdomen with and without IV gadolinium in 1 year is recommended to ensure continued stability of this lesion. This recommendation follows ACR consensus guidelines: Managing Incidental Findings on Abdominal CT: White Paper of the ACR Incidental Findings Committee. J Am Coll Radiol 2010;7:754-773. 3. At the time of follow-up abdominal MRI, attention should be directed to the small cystic lesions in the kidneys to ensure their stability is well. These are favored to represent simple cysts, however, today's study was limited by considerable respiratory motion. The patient should be counseled prior to the followup examination to maintain strict breath holding during the examination to ensure better image quality. 4. Moderate to large hiatal hernia.   Electronically Signed   By: Vinnie Langton M.D.   On: 09/23/2014 09:28    Laparoscopic Cholecystectomy with IOC 09/26/14  Labs:   Basic Metabolic Panel:  Recent Labs Lab 09/22/14 1248 09/23/14 0605 09/24/14 0611 09/26/14 0721 09/27/14 0620  NA 136 137 138 138  134*  K 3.7 3.4* 4.1 4.7 3.9  CL 105 107 110 112* 103  CO2 21* 19* 19* 20* 21*  GLUCOSE 105* 119* 112* 77 114*  BUN 14 9 9 8 10   CREATININE 0.86 0.68 0.63 0.69 0.75  CALCIUM 8.3* 8.5* 8.5* 7.8* 8.3*   GFR Estimated Creatinine Clearance: 84.7 mL/min (by C-G formula based on Cr of 0.75). Liver Function Tests:  Recent Labs Lab 09/22/14 1248 09/23/14 0605 09/24/14 0611 09/26/14 0721 09/27/14 0620  AST 94* 56* 38 63* 78*  ALT 129* 101* 75* 71* 86*  ALKPHOS 420* 397* 365*  284* 296*  BILITOT 4.0* 5.6* 1.7* 2.2* 1.6*  PROT 6.8 6.5 6.1* 5.5* 6.0*  ALBUMIN 2.8* 2.6* 2.6* 2.2* 2.4*    Recent Labs Lab 09/22/14 0543 09/23/14 0605  LIPASE 208* 18*   No results for input(s): AMMONIA in the last 168 hours. Coagulation profile No results for input(s): INR, PROTIME in the last 168 hours.  CBC:  Recent Labs Lab 09/22/14 0543 09/23/14 0605 09/26/14 0721 09/27/14 0620  WBC 12.1* 6.4 5.1 8.7  NEUTROABS 10.7*  --  3.5  --   HGB 14.3 13.3 11.5* 11.9*  HCT 42.3 39.8 35.8* 35.9*  MCV 87.0 87.3 89.7 88.6  PLT 216 206 197 200   Cardiac Enzymes:  Recent Labs Lab 09/22/14 0543  TROPONINI <0.03   BNP: Invalid input(s): POCBNP CBG: No results for input(s): GLUCAP in the last 168 hours. D-Dimer No results for input(s): DDIMER in the last 72 hours. Hgb A1c No results for input(s): HGBA1C in the last 72 hours. Lipid Profile No results for input(s): CHOL, HDL, LDLCALC, TRIG, CHOLHDL, LDLDIRECT in the last 72 hours. Thyroid function studies No results for input(s): TSH, T4TOTAL, T3FREE, THYROIDAB in the last 72 hours.  Invalid input(s): FREET3 Anemia work up No results for input(s): VITAMINB12, FOLATE, FERRITIN, TIBC, IRON, RETICCTPCT in the last 72 hours. Microbiology Recent Results (from the past 240 hour(s))  MRSA PCR Screening     Status: None   Collection Time: 09/25/14  4:09 PM  Result Value Ref Range Status   MRSA by PCR NEGATIVE NEGATIVE Final    Comment:        The GeneXpert MRSA Assay (FDA approved for NASAL specimens only), is one component of a comprehensive MRSA colonization surveillance program. It is not intended to diagnose MRSA infection nor to guide or monitor treatment for MRSA infections.   Surgical pcr screen     Status: None   Collection Time: 09/25/14  7:55 PM  Result Value Ref Range Status   MRSA, PCR NEGATIVE NEGATIVE Final   Staphylococcus aureus NEGATIVE NEGATIVE Final    Comment:        The Xpert SA Assay  (FDA approved for NASAL specimens in patients over 33 years of age), is one component of a comprehensive surveillance program.  Test performance has been validated by [facility] for patients greater than or equal to 64 year old. It is not intended to diagnose infection nor to guide or monitor treatment.      Discharge Instructions:   Discharge Instructions    Diet - low sodium heart healthy    Complete by:  As directed      Increase activity slowly    Complete by:  As directed             Medication List    TAKE these medications        acetaminophen 650 MG CR tablet  Commonly  known as:  TYLENOL  Take 650 mg by mouth every 4 (four) hours as needed for pain or fever.     amLODipine 5 MG tablet  Commonly known as:  NORVASC  Take 5 mg by mouth daily.     baclofen 10 MG tablet  Commonly known as:  LIORESAL  Take 10 mg by mouth 4 (four) times daily.     calcium carbonate 1250 (500 CA) MG tablet  Commonly known as:  OS-CAL - dosed in mg of elemental calcium  Take 1 tablet by mouth 2 (two) times daily with a meal. 500mg  elemental     dicyclomine 20 MG tablet  Commonly known as:  BENTYL  Take 10 mg by mouth 3 (three) times daily.     dipyridamole-aspirin 200-25 MG per 12 hr capsule  Commonly known as:  AGGRENOX  Take 1 capsule by mouth 2 (two) times daily.     furosemide 20 MG tablet  Commonly known as:  LASIX  Take 20 mg by mouth daily.     gabapentin 100 MG capsule  Commonly known as:  NEURONTIN  Take 100 mg by mouth 3 (three) times daily.     loratadine 10 MG tablet  Commonly known as:  CLARITIN  Take 10 mg by mouth daily as needed for allergies.     metoprolol tartrate 25 MG tablet  Commonly known as:  LOPRESSOR  Take 0.5 tablets (12.5 mg total) by mouth 2 (two) times daily.     nitroGLYCERIN 0.4 MG SL tablet  Commonly known as:  NITROSTAT  Place 0.4 mg under the tongue every 5 (five) minutes as needed for chest pain.     NUEDEXTA 20-10 MG Caps   Generic drug:  Dextromethorphan-Quinidine  Take 1 capsule by mouth daily.     omeprazole 20 MG capsule  Commonly known as:  PRILOSEC  Take 20 mg by mouth daily.     oxyCODONE-acetaminophen 5-325 MG per tablet  Commonly known as:  ROXICET  Take 1 tablet by mouth every 4 (four) hours as needed for severe pain.     PREPLUS 27-1 MG Tabs  Take 1 tablet by mouth 2 (two) times daily.     promethazine 25 MG tablet  Commonly known as:  PHENERGAN  Take 25 mg by mouth every 6 (six) hours as needed for nausea or vomiting.     simvastatin 20 MG tablet  Commonly known as:  ZOCOR  Take 30 mg by mouth every evening. For hyperlipidemia /CVA     Vitamin D3 2000 UNITS capsule  Take 2,000 Units by mouth daily.           Follow-up Information    Follow up with Blanchie Serve, MD.   Specialty:  Internal Medicine   Contact information:   [place] [address] [phone]       Follow up with Maia Petties., MD. Schedule an appointment as soon as possible for a visit in 2 weeks.   Specialty:  General Surgery   Why:  Surgical follow up.   Contact information:   [place] [address] [phone]        Time coordinating discharge: 35 minutes.  Signed:  Lurline Caver  Pager [phone] [facility] 09/28/2014, 9:18 AM

## 2014-09-28 NOTE — Progress Notes (Signed)
2 Days Post-Op  Subjective: Tolerating POs.   Objective: Vital signs in last 24 hours: Temp:  [98.3 F (36.8 C)-98.8 F (37.1 C)] 98.3 F (36.8 C) (05/09 0601) Pulse Rate:  [85] 85 (05/08 2207) Resp:  [16-20] 16 (05/09 0601) BP: (106-125)/(58-71) 106/58 mmHg (05/09 0601) SpO2:  [96 %-98 %] 98 % (05/09 0601) Weight:  [71.986 kg (158 lb 11.2 oz)] 71.986 kg (158 lb 11.2 oz) (05/09 0602) Last BM Date:  (few days ago per pt)  Intake/Output from previous day: 05/08 0701 - 05/09 0700 In: -  Out: 275 [Urine:275] Intake/Output this shift: Total I/O In: 120 [P.O.:120] Out: -   General appearance: alert, cooperative and no distress GI: +bs, abdomen is soft, non tender, incisions are c/d/i.  Lab Results:   Recent Labs  09/26/14 0721 09/27/14 0620  WBC 5.1 8.7  HGB 11.5* 11.9*  HCT 35.8* 35.9*  PLT 197 200   BMET  Recent Labs  09/26/14 0721 09/27/14 0620  NA 138 134*  K 4.7 3.9  CL 112* 103  CO2 20* 21*  GLUCOSE 77 114*  BUN 8 10  CREATININE 0.69 0.75  CALCIUM 7.8* 8.3*   PT/INR No results for input(s): LABPROT, INR in the last 72 hours. ABG  Recent Labs  09/26/14 1815  PHART 7.363  HCO3 17.8*    Studies/Results: Dg Chest Port 1 View  09/26/2014   CLINICAL DATA:  Postop check. Status post laparoscopic cholecystectomy.  EXAM: PORTABLE CHEST - 1 VIEW  COMPARISON:  09/22/2014  FINDINGS: Lung volumes are low. Large hiatal hernia projecting behind the cardiac silhouette. There is opacity at the lung bases most likely atelectasis. No convincing pulmonary edema.  No pneumothorax.  Cardiac silhouette is normal in size.  Bony thorax is grossly intact.  IMPRESSION: Limited exam due to low lung volumes. No convincing pneumonia or pulmonary edema. There is basilar atelectasis, greater on the left adjacent to a large hiatal hernia.   Electronically Signed   By: Lajean Manes M.D.   On: 09/26/2014 13:21    Anti-infectives: Anti-infectives    Start     Dose/Rate Route  Frequency Ordered Stop   09/26/14 0600  ciprofloxacin (CIPRO) IVPB 400 mg    Comments:  Pharmacy may adjust dosing strength, interval, or rate of medication as needed for optimal therapy for the patient Send with patient on call to the OR.  Anesthesia to complete antibiotic administration <50min prior to incision per [facility].   400 mg 200 mL/hr over 60 Minutes Intravenous On call to O.R. 09/25/14 1008 09/26/14 1001   09/25/14 1000  ciprofloxacin (CIPRO) IVPB 400 mg  Status:  Discontinued     400 mg 200 mL/hr over 60 Minutes Intravenous Every 12 hours 09/25/14 0804 09/25/14 0805   09/25/14 0815  ciprofloxacin (CIPRO) IVPB 400 mg     400 mg 200 mL/hr over 60 Minutes Intravenous  Once 09/25/14 0805 09/25/14 0750   09/22/14 1800  metroNIDAZOLE (FLAGYL) IVPB 500 mg  Status:  Discontinued     500 mg 100 mL/hr over 60 Minutes Intravenous Every 8 hours 09/22/14 1542 09/23/14 1709   09/22/14 1800  aztreonam (AZACTAM) 1 g in dextrose 5 % 50 mL IVPB  Status:  Discontinued     1 g 100 mL/hr over 30 Minutes Intravenous Every 8 hours 09/22/14 1322 09/23/14 1709   09/22/14 1000  ciprofloxacin (CIPRO) IVPB 400 mg     400 mg 200 mL/hr over 60 Minutes Intravenous  Once 09/22/14 0951 09/22/14  1154   09/22/14 1000  metroNIDAZOLE (FLAGYL) IVPB 500 mg     500 mg 100 mL/hr over 60 Minutes Intravenous  Once 09/22/14 6378 09/22/14 1143      Assessment/Plan: POD#2 laparoscopic cholecystectomy--Dr. Georgette Dover  Stable for discharge from surgical standpoint. Post op instructions and follow up appt provided.     LOS: 6 days    Raimundo Corbit ANP-BC 09/28/2014 11:31 AM

## 2014-09-28 NOTE — Discharge Instructions (Signed)

## 2014-09-28 NOTE — Clinical Social Work Note (Signed)
Clinical Social Worker facilitated patient discharge including contacting facility to confirm patient discharge plans.  Clinical information faxed to facility and family agreeable with plan.  Patient has provided family with update on discharge plans and states no need for continued follow up.  CSW arranged ambulance transport via [facility] to [facility]  RN to call report prior to discharge.  Clinical Social Worker will sign off for now as social work intervention is no longer needed. Please consult [place] again if new need arises.  Barbette Or, [place]

## 2014-09-28 NOTE — Progress Notes (Signed)
Report given to SNF.

## 2014-09-29 ENCOUNTER — Other Ambulatory Visit: Payer: Self-pay | Admitting: *Deleted

## 2014-09-29 MED ORDER — OXYCODONE-ACETAMINOPHEN 5-325 MG PO TABS: ORAL_TABLET | ORAL | Status: DC

## 2014-09-29 NOTE — Telephone Encounter (Signed)
[facility] 

## 2014-09-30 ENCOUNTER — Other Ambulatory Visit: Payer: Self-pay | Admitting: Surgery

## 2014-10-01 ENCOUNTER — Non-Acute Institutional Stay (SKILLED_NURSING_FACILITY): Payer: Medicare Other | Admitting: Internal Medicine

## 2014-10-01 DIAGNOSIS — K851 Biliary acute pancreatitis without necrosis or infection: Secondary | ICD-10-CM

## 2014-10-01 DIAGNOSIS — G8114 Spastic hemiplegia affecting left nondominant side: Secondary | ICD-10-CM | POA: Diagnosis not present

## 2014-10-01 DIAGNOSIS — I110 Hypertensive heart disease with heart failure: Secondary | ICD-10-CM

## 2014-10-01 DIAGNOSIS — K219 Gastro-esophageal reflux disease without esophagitis: Secondary | ICD-10-CM | POA: Diagnosis not present

## 2014-10-01 DIAGNOSIS — K580 Irritable bowel syndrome with diarrhea: Secondary | ICD-10-CM

## 2014-10-01 DIAGNOSIS — M810 Age-related osteoporosis without current pathological fracture: Secondary | ICD-10-CM

## 2014-10-01 DIAGNOSIS — I509 Heart failure, unspecified: Secondary | ICD-10-CM

## 2014-10-01 DIAGNOSIS — I639 Cerebral infarction, unspecified: Secondary | ICD-10-CM

## 2014-10-01 DIAGNOSIS — I5032 Chronic diastolic (congestive) heart failure: Secondary | ICD-10-CM | POA: Diagnosis not present

## 2014-10-01 DIAGNOSIS — F482 Pseudobulbar affect: Secondary | ICD-10-CM

## 2014-10-01 DIAGNOSIS — J449 Chronic obstructive pulmonary disease, unspecified: Secondary | ICD-10-CM | POA: Diagnosis not present

## 2014-10-02 ENCOUNTER — Ambulatory Visit: Payer: Medicare Other | Admitting: Neurology

## 2014-10-02 LAB — HEPATIC FUNCTION PANEL
ALK PHOS: 237 U/L — AB (ref 25–125)
ALT: 32 U/L (ref 10–40)
AST: 26 U/L (ref 14–40)
Bilirubin, Total: 0.9 mg/dL

## 2014-10-02 LAB — BASIC METABOLIC PANEL
BUN: 20 mg/dL (ref 4–21)
Creatinine: 0.8 mg/dL (ref 0.6–1.3)
GLUCOSE: 88 mg/dL
Potassium: 4.3 mmol/L (ref 3.4–5.3)
Sodium: 141 mmol/L (ref 137–147)

## 2014-10-02 NOTE — Progress Notes (Signed)
Patient ID: [name], male   DOB: 1940-06-26, 74 y.o.   MRN: [id number]     Facility: [facility]    PCP: Blanchie Serve, MD  Code Status: full code  Allergies  Allergen Reactions  . Penicillins Rash    Chief Complaint  Patient presents with  . Readmit To SNF     HPI:  74 year old patient is here for long term care post hospital admission from 09/22/14-09/28/14 with acute pancreatitis and cholecystitis from gallstones. He underwent ERCP and stones were removed. He then underwent laproscopic cholecystectomy. He is to follow with surgery outpatient. He is seen in his room today. He is in no distress. His appetite is returning. Denies any concerns this visit.   Review of Systems:  Constitutional: Negative for fever, chills, diaphoresis. has some fatigue HENT: Negative for headache, congestion, nasal discharge Eyes: Negative for eye pain, blurred vision, double vision and discharge.  Respiratory: Negative for cough, shortness of breath and wheezing.   Cardiovascular: Negative for chest pain, palpitations, leg swelling.  Gastrointestinal: Negative for heartburn, nausea, vomiting, abdominal pain. Denies loose stools at present Genitourinary: Negative for dysuria Musculoskeletal: Negative for back pain, falls in facility Skin: Negative for itching, rash.  Neurological: Negative for dizziness, tingling, focal weakness Psychiatric/Behavioral: Negative for depression  Past Medical History  Diagnosis Date  . GERD (gastroesophageal reflux disease)   . Hypertension   . Hernia     "large one; on my left stomach" (09/22/2014)  . Hyperlipidemia   . HTN (hypertension) 10/24/2012  . Osteoporosis, unspecified 10/24/2012  . 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 Hiatal hernia 10/24/2012  . Pneumonia     "once or twice" (09/22/2014)  . Stroke 2007    "left side doesn't work now" (09/22/2014)   Past Surgical History  Procedure Laterality  Date  . Appendectomy  05/27/2009    Ronnald Collum, MD  . Cataract extraction w/ intraocular lens  implant, bilateral Bilateral ?2014  . Ercp N/A 09/25/2014    Procedure: ENDOSCOPIC RETROGRADE CHOLANGIOPANCREATOGRAPHY (ERCP);  Surgeon: Clarene Essex, MD;  Location: [facility];  Service: Endoscopy;  Laterality: N/A;  . Cholecystectomy N/A 09/26/2014    Procedure: LAPAROSCOPIC CHOLECYSTECTOMY WITH INTRAOPERATIVE CHOLANGIOGRAM;  Surgeon: Donnie Mesa, MD;  Location: [place];  Service: General;  Laterality: N/A;   Social History:   reports that he quit smoking about 8 years ago. His smoking use included Cigarettes. He smoked 0.50 packs per day. He has never used smokeless tobacco. He reports that he drinks alcohol. He reports that he does not use illicit drugs.  Family History  Problem Relation Age of Onset  . Heart attack Mother   . Heart attack Father   . Cancer Maternal Aunt     Stomach  . Cancer Paternal Aunt     Uterine,Colon    Medications: Patient's Medications  New Prescriptions   No medications on file  Previous Medications   ACETAMINOPHEN (TYLENOL) 650 MG CR TABLET    Take 650 mg by mouth every 4 (four) hours as needed for pain or fever.   AMLODIPINE (NORVASC) 5 MG TABLET    Take 5 mg by mouth daily.   BACLOFEN (LIORESAL) 10 MG TABLET    Take 10 mg by mouth 4 (four) times daily.   CALCIUM CARBONATE (OS-CAL - DOSED IN MG OF ELEMENTAL CALCIUM) 1250 MG TABLET    Take 1 tablet by mouth 2 (two) times daily with a meal. 500mg  elemental  CHOLECALCIFEROL (VITAMIN D3) 2000 UNITS CAPSULE    Take 2,000 Units by mouth daily.   DEXTROMETHORPHAN-QUINIDINE (NUEDEXTA) 20-10 MG CAPS    Take 1 capsule by mouth daily.   DICYCLOMINE (BENTYL) 20 MG TABLET    Take 10 mg by mouth 3 (three) times daily.    DIPYRIDAMOLE-ASPIRIN (AGGRENOX) 200-25 MG PER 12 HR CAPSULE    Take 1 capsule by mouth 2 (two) times daily.   FUROSEMIDE (LASIX) 20 MG TABLET    Take 20 mg by mouth daily.   GABAPENTIN (NEURONTIN) 100 MG  CAPSULE    Take 100 mg by mouth 3 (three) times daily.   LORATADINE (CLARITIN) 10 MG TABLET    Take 10 mg by mouth daily as needed for allergies.   METOPROLOL TARTRATE (LOPRESSOR) 25 MG TABLET    Take 0.5 tablets (12.5 mg total) by mouth 2 (two) times daily.   NITROGLYCERIN (NITROSTAT) 0.4 MG SL TABLET    Place 0.4 mg under the tongue every 5 (five) minutes as needed for chest pain.   OMEPRAZOLE (PRILOSEC) 20 MG CAPSULE    Take 20 mg by mouth daily.   OXYCODONE-ACETAMINOPHEN (ROXICET) 5-325 MG PER TABLET    Take one tablet by mouth every 4 hours as needed for severe pain. Do not exceed 4 grams in 24 hours   PRENATAL VIT-FE FUMARATE-FA (PREPLUS) 27-1 MG TABS    Take 1 tablet by mouth 2 (two) times daily.   PROMETHAZINE (PHENERGAN) 25 MG TABLET    Take 25 mg by mouth every 6 (six) hours as needed for nausea or vomiting.   SIMVASTATIN (ZOCOR) 20 MG TABLET    Take 30 mg by mouth every evening. For hyperlipidemia /CVA  Modified Medications   No medications on file  Discontinued Medications   No medications on file     Physical Exam: Filed Vitals:   10/01/14 1609  BP: 126/68  Pulse: 82  Temp: 97.9 F (36.6 C)  Resp: 18  SpO2: 97%   General- elderly male in no acute distress Head- atraumatic, normocephalic Eyes- PERRLA, EOMI, no pallor, no icterus, no discharge Neck- no cervical lymphadenopathy Nose- no sinus tenderness, no nasal drainage Throat- moist mucus membrane, normal oropharynx Cardiovascular- normal s1,s2, no murmurs, normal dorsalis pedis Respiratory- bilateral clear to auscultation, no wheeze, no rhonchi, no crackles, no use of accessory muscles Abdomen- bowel sounds present, soft, non tender, no guarding or rigidity, no CVA tenderness Musculoskeletal- left sided hemiplegia, normal strength on right side, trace left leg edema Neurological- no focal deficit Skin- warm and dry, laparoscopic incision healing well. Dressing in place Psychiatry- alert and oriented to person,  place and time, normal mood and affect   Labs reviewed: Basic Metabolic Panel:  Recent Labs  09/24/14 0611 09/26/14 0721 09/27/14 0620  NA 138 138 134*  K 4.1 4.7 3.9  CL 110 112* 103  CO2 19* 20* 21*  GLUCOSE 112* 77 114*  BUN 9 8 10   CREATININE 0.63 0.69 0.75  CALCIUM 8.5* 7.8* 8.3*   Liver Function Tests:  Recent Labs  09/24/14 0611 09/26/14 0721 09/27/14 0620  AST 38 63* 78*  ALT 75* 71* 86*  ALKPHOS 365* 284* 296*  BILITOT 1.7* 2.2* 1.6*  PROT 6.1* 5.5* 6.0*  ALBUMIN 2.6* 2.2* 2.4*    Recent Labs  09/22/14 0543 09/23/14 0605  LIPASE 208* 18*   No results for input(s): AMMONIA in the last 8760 hours. CBC:  Recent Labs  09/22/14 0543 09/23/14 8250 09/26/14 5397 09/27/14 6734  WBC 12.1* 6.4 5.1 8.7  NEUTROABS 10.7*  --  3.5  --   HGB 14.3 13.3 11.5* 11.9*  HCT 42.3 39.8 35.8* 35.9*  MCV 87.0 87.3 89.7 88.6  PLT 216 206 197 200   Cardiac Enzymes:  Recent Labs  04/03/14 2024 04/04/14 0339 09/22/14 0543  TROPONINI <0.30 <0.30 <0.03    Assessment/Plan  Acute pancreatitis From gall stone cholecystitis. S/p removal of stone from bile duct and cholecystectomy. Remains asymptomatic now. Monitor clinically. Tolerating regular diet. Has follow up with surgery. F/u on LFT   gerd Without esophagitis. 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 Continue baclofen  chf Stable, continue lasix and metoprolol for now  HTN Stable bp reading, continue norvasc, metoprolol and lasix, monitor bmp  Old CVA Stable, continue aggrenox and bp medication  PBA Continue nudexta, at his baseline at present  Copd Stable, continue bronchodilator regimen  Age related osteoporosis No fall or recent fracture. Continue oscal and vitamin d  supplement   Goals of care: long term care   Labs/tests ordered: cbc with diff, cmp  Family/ staff Communication: reviewed care plan with patient and nursing supervisor    Blanchie Serve, MD  [place] [phone] (Monday-Friday 8 am - 5 pm) [phone] (afterhours)

## 2014-10-06 LAB — CBC AND DIFFERENTIAL
HEMATOCRIT: 34 % — AB (ref 41–53)
Hemoglobin: 11.4 g/dL — AB (ref 13.5–17.5)
Platelets: 378 10*3/uL (ref 150–399)
WBC: 13.1 10^3/mL

## 2014-10-13 ENCOUNTER — Encounter: Payer: Self-pay | Admitting: *Deleted

## 2014-10-13 DIAGNOSIS — G8310 Monoplegia of lower limb affecting unspecified side: Secondary | ICD-10-CM

## 2014-10-13 DIAGNOSIS — G8114 Spastic hemiplegia affecting left nondominant side: Secondary | ICD-10-CM

## 2014-10-14 ENCOUNTER — Encounter: Payer: Self-pay | Admitting: Neurology

## 2014-10-14 ENCOUNTER — Ambulatory Visit (INDEPENDENT_AMBULATORY_CARE_PROVIDER_SITE_OTHER): Payer: Medicare Other | Admitting: Neurology

## 2014-10-14 DIAGNOSIS — G8114 Spastic hemiplegia affecting left nondominant side: Secondary | ICD-10-CM

## 2014-10-14 DIAGNOSIS — G8314 Monoplegia of lower limb affecting left nondominant side: Secondary | ICD-10-CM | POA: Diagnosis not present

## 2014-10-14 DIAGNOSIS — I69354 Hemiplegia and hemiparesis following cerebral infarction affecting left non-dominant side: Secondary | ICD-10-CM

## 2014-10-14 MED ORDER — INCOBOTULINUMTOXINA 100 UNITS IM SOLR
300.0000 [IU] | Freq: Once | INTRAMUSCULAR | Status: AC
  Administered 2014-10-14: 300 [IU] via INTRAMUSCULAR

## 2014-10-14 NOTE — Progress Notes (Signed)
HPI:   Gregory Kerr is a 74 year old right-handed Caucasian male, came in with his sister, he previously received EMG guided Botulinum toxin injection for his spastic left hemiparesis.   Gregory Kerr  has suffered  stroke in 2007, with residual spastice left spastic hemiparesis, at home,  he has limited functional status, was able to use hemiwalker transfer himself in and out of wheelchair, wearing left foot ankle brace, hew as able to ambulate some.  he was started on Botox injection since 2011.,  He reported good response with the injection, the injection was done to loosen up his left upper extremity, it started to wear off in 8-10 weeks.  He also has past medical history of hypertension, anemia,     He has forceful  left finger/wrist flexion, limited ROM of left elbow, but he deines signficiant pain. He also has left knee extension, left ankle plantar flexion   UPDATE July 1st 2015: Last injection was in Oct 2014.  His sister is concerned about the the potential benefit vs long term side effect of repeated botulinum toxin injections. And patient is concerned about needle pain that is associated with the injection.  He did indicate that previous injections have been very helpful in relaxing his left shoulder, left elbow, left wrist and fingers, he did not notice significant side effect, desires further treatment.  UPDATE Sept 2nd 2015: He noticed increased specificity of his left arm, now he is quite few months out from previous injection, especially tightness of his left elbow, left shoulder, he denies significant benefit from previous injection to his left lower extremity, also worry about the side effect of causing weakness in his left hand  UPDATE Dec 2nd 2015: Last injection was in Sept 2nd 2015, works well for him, the injection focused at his left arm now.  He does not want to have injection to his left leg, he still uses his left finger to grip,  UPDATE Oct 14 2014: Last EMG guided xeomin  injection was in December second 2015, he had cholecystectomy just few weeks ago, recovering well, he wants this injection emphasized in his spastic left shoulder, elbow, wrist, he still uses his left hand to grip, supporting, does not want a week his left fingers too much  Review of the system: Out of a complete 14 system review, the patient complains of only the following symptoms, and all other reviewed systems are negative.     Physical Exam  General: well nourished, well hydrated, no acute distress Head: normocephalic  Neck: supple, no masses, trachea midline Respiratory: clear to auscultation bilaterally. Cardiovascular: RRR   Neurologic Exam  Mental Status: in wheel chair, pleasant. mild slurred hoarse speeach Cranial Nerves: mild left lower face weakness. Motor: spastic left hemiparesis. Left arm stayed in left shoulder internal rotation, addcution, left forearm pronation, shoulder abduction 3, elbow flexion 3, left wrist forceful flexed position, maximum 150 degree,  left elbow fixed flexion, pronation. maximum extension 160 degree. Left leg extension at knee, left ankle fixed plantar inversion, wear left ankle boot   Coordination: normal right finger to nose at right side Gait and Station: deferred    Assessment and Plan:   74 yo with history of stroke, spastic left hemiparesis, his questions about potential long-term botulism toxin injection side effect was explained to him, including muscle weakness, atrophy.  He did indicate that previous injections have been very helpful in relaxing his left shoulder, left elbow, left wrist and fingers, he did not notice significant side effect, desires  further treatment.  Under EMG guidance, He could not tolerate electrical stimulation in the past.  300 units of xeomin was injected into his spastic left upper extremity  Left biceps 25x2=50 Left brachialis 25 x3=75 Left pronator teres 25  Left pectoralis major 25 x2= 50  Left teres major  25x2=50 Left flexor carpi ulnaris 25 Left palmaris longus 25  Gregory Kerr, M.D. Ph.D.  [facility] [place], [place] [address] Phone: [phone] Fax:      [phone]

## 2014-10-14 NOTE — Progress Notes (Signed)
**  Xeomin, [id number], Exp 11/2016, JAR#[id number]**TEI,HD

## 2014-11-02 ENCOUNTER — Non-Acute Institutional Stay (SKILLED_NURSING_FACILITY): Payer: Medicare Other | Admitting: Internal Medicine

## 2014-11-02 ENCOUNTER — Encounter: Payer: Self-pay | Admitting: Internal Medicine

## 2014-11-02 DIAGNOSIS — I110 Hypertensive heart disease with heart failure: Secondary | ICD-10-CM | POA: Diagnosis not present

## 2014-11-02 DIAGNOSIS — G8114 Spastic hemiplegia affecting left nondominant side: Secondary | ICD-10-CM

## 2014-11-02 DIAGNOSIS — I509 Heart failure, unspecified: Secondary | ICD-10-CM

## 2014-11-02 DIAGNOSIS — K219 Gastro-esophageal reflux disease without esophagitis: Secondary | ICD-10-CM

## 2014-11-02 NOTE — Progress Notes (Signed)
Patient ID: Gregory Kerr, male   DOB: 21-Sep-1940, 74 y.o.   MRN: [id number]    [facility]  Code Status: Full Code  Chief Complaint  Patient presents with  . Medical Management of Chronic Issues    Routine Visit    Allergies  Allergen Reactions  . Penicillins Rash   HPI:  74 year old patient is seen for routine visit. He has been at his baseline and denies any concerns. No new concern from staff.    Review of Systems:  Constitutional: Negative for fever, chills, diaphoresis. has some fatigue HENT: Negative for headache, congestion, nasal discharge Eyes: Negative for eye pain, blurred vision, double vision and discharge.  Respiratory: Negative for cough, shortness of breath and wheezing.   Cardiovascular: Negative for chest pain, palpitations, leg swelling.  Gastrointestinal: Negative for heartburn, nausea, vomiting, abdominal pain.  Genitourinary: Negative for dysuria Musculoskeletal: Negative for back pain, falls in facility Skin: Negative for itching, rash.  Neurological: Negative for dizziness, tingling, focal weakness Psychiatric/Behavioral: Negative for depression  Past Medical History  Diagnosis Date  . GERD (gastroesophageal reflux disease)   . Hypertension   . Hernia     "large one; on my left stomach" (09/22/2014)  . Hyperlipidemia   . HTN (hypertension) 10/24/2012  . Osteoporosis, unspecified 10/24/2012  . 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 Hiatal hernia 10/24/2012  . Pneumonia     "once or twice" (09/22/2014)  . Stroke 2007    "left side doesn't work now" (09/22/2014)    Medications: Patient's Medications  New Prescriptions   No medications on file  Previous Medications   ACETAMINOPHEN (TYLENOL) 650 MG CR TABLET    Take 650 mg by mouth every 4 (four) hours as needed for pain or fever.   AMINO ACIDS-PROTEIN HYDROLYS (FEEDING SUPPLEMENT, PRO-STAT SUGAR FREE 64,) LIQD    Take 30 mLs by mouth daily.   AMLODIPINE  (NORVASC) 5 MG TABLET    Take 5 mg by mouth daily.   BACLOFEN (LIORESAL) 10 MG TABLET    Take 10 mg by mouth 4 (four) times daily.   CALCIUM CARBONATE (OS-CAL - DOSED IN MG OF ELEMENTAL CALCIUM) 1250 MG TABLET    Take 1 tablet by mouth 2 (two) times daily with a meal. 500mg  elemental   CHOLECALCIFEROL (VITAMIN D3) 2000 UNITS TABS    Take by mouth daily.   DEXTROMETHORPHAN-QUINIDINE (NUEDEXTA) 20-10 MG CAPS    Take 1 capsule by mouth daily.   DICYCLOMINE (BENTYL) 20 MG TABLET    Take 10 mg by mouth 3 (three) times daily.    DIPYRIDAMOLE-ASPIRIN (AGGRENOX) 200-25 MG PER 12 HR CAPSULE    Take 1 capsule by mouth 2 (two) times daily.   DOCUSATE SODIUM (COLACE) 100 MG CAPSULE    Take 100 mg by mouth 2 (two) times daily.   FUROSEMIDE (LASIX) 20 MG TABLET    Take 20 mg by mouth daily.   GABAPENTIN (NEURONTIN) 100 MG CAPSULE    Take 100 mg by mouth 3 (three) times daily.   INCOBOTULINUMTOXINA (XEOMIN IM)    Inject 300 Units into the muscle every 3 (three) months.   LORATADINE (CLARITIN) 10 MG TABLET    Take 10 mg by mouth daily as needed for allergies.   METOPROLOL TARTRATE (LOPRESSOR) 25 MG TABLET    Take 0.5 tablets (12.5 mg total) by mouth 2 (two) times daily.   NITROGLYCERIN (NITROSTAT) 0.4 MG SL TABLET    Place  0.4 mg under the tongue every 5 (five) minutes as needed for chest pain.   OMEPRAZOLE (PRILOSEC) 20 MG CAPSULE    Take 20 mg by mouth daily.   OXYCODONE-ACETAMINOPHEN (ROXICET) 5-325 MG PER TABLET    Take one tablet by mouth every 4 hours as needed for severe pain. Do not exceed 4 grams in 24 hours   PRENATAL VIT-FE FUMARATE-FA (PREPLUS) 27-1 MG TABS    Take 1 tablet by mouth 2 (two) times daily.   PROMETHAZINE (PHENERGAN) 25 MG TABLET    Take 25 mg by mouth every 6 (six) hours as needed for nausea or vomiting.   SIMVASTATIN (ZOCOR) 10 MG TABLET    3 by mouth daily for a total of 30 mg daily  Modified Medications   No medications on file  Discontinued Medications   SIMVASTATIN (ZOCOR) 20 MG  TABLET    Take 30 mg by mouth every evening. For hyperlipidemia /CVA     Physical Exam: Filed Vitals:   11/02/14 1352  BP: 112/76  Pulse: 65  Resp: 16  Height: 5\' 9"  (1.753 m)  Weight: 151 lb (68.493 kg)  SpO2: 95%   General- elderly male in no acute distress Head- atraumatic, normocephalic Eyes- PERRLA, EOMI, no pallor, no icterus, no discharge Neck- no cervical lymphadenopathy Nose- no sinus tenderness, no nasal drainage Throat- moist mucus membrane, normal oropharynx Cardiovascular- normal s1,s2, no murmurs, normal dorsalis pedis Respiratory- bilateral clear to auscultation, no wheeze, no rhonchi, no crackles, no use of accessory muscles Abdomen- bowel sounds present, soft, non tender, no guarding or rigidity, no CVA tenderness Musculoskeletal- left sided hemiplegia, normal strength on right side, trace left leg edema Neurological- no focal deficit Skin- warm and dry, laparoscopic incision healing well. Dressing in place Psychiatry- alert and oriented to person, place and time, normal mood and affect   Labs reviewed: Basic Metabolic Panel:  Recent Labs  09/24/14 0611 09/26/14 0721 09/27/14 0620 10/02/14 1055  NA 138 138 134* 141  K 4.1 4.7 3.9 4.3  CL 110 112* 103  --   CO2 19* 20* 21*  --   GLUCOSE 112* 77 114*  --   BUN 9 8 10 20   CREATININE 0.63 0.69 0.75 0.8  CALCIUM 8.5* 7.8* 8.3*  --    Liver Function Tests:  Recent Labs  09/24/14 0611 09/26/14 0721 09/27/14 0620 10/02/14 1055  AST 38 63* 78* 26  ALT 75* 71* 86* 32  ALKPHOS 365* 284* 296* 237*  BILITOT 1.7* 2.2* 1.6*  --   PROT 6.1* 5.5* 6.0*  --   ALBUMIN 2.6* 2.2* 2.4*  --     Recent Labs  09/22/14 0543 09/23/14 0605  LIPASE 208* 18*   No results for input(s): AMMONIA in the last 8760 hours. CBC:  Recent Labs  09/22/14 0543 09/23/14 0605 09/26/14 0721 09/27/14 0620 10/06/14 0826  WBC 12.1* 6.4 5.1 8.7 13.1  NEUTROABS 10.7*  --  3.5  --   --   HGB 14.3 13.3 11.5* 11.9* 11.4*    HCT 42.3 39.8 35.8* 35.9* 34*  MCV 87.0 87.3 89.7 88.6  --   PLT 216 206 197 200 378   Cardiac Enzymes:  Recent Labs  04/03/14 2024 04/04/14 0339 09/22/14 0543  TROPONINI <0.30 <0.30 <0.03    Assessment/Plan  gerd Without esophagitis. Continue prilosec and monitor symptoms  Spastic hemiplegia  On non dominant side, stable, continue periodic neurology appointment for botox injection. Continue baclofen  HTN Stable bp reading, continue norvasc, metoprolol  and lasix, monitor bmp   Blanchie Serve, MD  [facility] [phone] (Monday-Friday 8 am - 5 pm) [phone] (afterhours)

## 2014-11-30 LAB — CBC AND DIFFERENTIAL
HEMATOCRIT: 40 % — AB (ref 41–53)
Hemoglobin: 13 g/dL — AB (ref 13.5–17.5)
Platelets: 200 10*3/uL (ref 150–399)
WBC: 5.5 10^3/mL

## 2014-11-30 LAB — BASIC METABOLIC PANEL
BUN: 23 mg/dL — AB (ref 4–21)
Creatinine: 0.8 mg/dL (ref <=1.3)
GLUCOSE: 86 mg/dL
Potassium: 4.1 mmol/L (ref 3.4–5.3)
SODIUM: 140 mmol/L (ref 137–147)

## 2014-12-10 ENCOUNTER — Non-Acute Institutional Stay (SKILLED_NURSING_FACILITY): Payer: Medicare Other | Admitting: Internal Medicine

## 2014-12-10 DIAGNOSIS — I209 Angina pectoris, unspecified: Secondary | ICD-10-CM | POA: Diagnosis not present

## 2014-12-10 DIAGNOSIS — M810 Age-related osteoporosis without current pathological fracture: Secondary | ICD-10-CM | POA: Insufficient documentation

## 2014-12-10 DIAGNOSIS — I5032 Chronic diastolic (congestive) heart failure: Secondary | ICD-10-CM | POA: Diagnosis not present

## 2014-12-10 DIAGNOSIS — I509 Heart failure, unspecified: Secondary | ICD-10-CM

## 2014-12-10 DIAGNOSIS — K580 Irritable bowel syndrome with diarrhea: Secondary | ICD-10-CM | POA: Insufficient documentation

## 2014-12-10 DIAGNOSIS — I110 Hypertensive heart disease with heart failure: Secondary | ICD-10-CM

## 2014-12-10 DIAGNOSIS — J449 Chronic obstructive pulmonary disease, unspecified: Secondary | ICD-10-CM | POA: Insufficient documentation

## 2014-12-10 DIAGNOSIS — I639 Cerebral infarction, unspecified: Secondary | ICD-10-CM | POA: Insufficient documentation

## 2014-12-10 NOTE — Progress Notes (Signed)
Patient ID: Gregory Kerr, male   DOB: 1940-08-08, 74 y.o.   MRN: [id number]     Facility: [facility] - [facility] care  Chief complaint: medical management of chronic isssues  Allergy: PCN  Code status- full code  HPI  74 y/o male patient is seen today for routine visit. He denies any concerns for today. No falls reported. No new skin concerns. He has pmh of cva, HLD, HTN among others. No new concerns from the staff.   Review of Systems Constitutional: Negative for malaise/fatigue and diaphoresis.   HENT: Negative for congestion, hearing loss, earache and sore throat.    Respiratory: Negative for cough, sputum production, shortness of breath and wheezing.    Cardiovascular: Negative for chest pain, palpitations, orthopnea and leg swelling.   Gastrointestinal: Negative for heartburn, nausea, vomiting, abdominal pain Genitourinary: Negative for dysuria Musculoskeletal: Negative for falls, joint pain and myalgias. Has muscle spasticity. Gets routine botox injections from neurology Skin: Negative for itching, rash.   Neurological: Negative for new weakness,dizziness, tingling, headaches.   Psychiatric/Behavioral: Negative for depression, insomnia and memory loss. The patient is not nervous/anxious.    Past medical history reviewed  Medications reviewed   Medication List       This list is accurate as of: 12/10/14  4:02 PM.  Always use your most recent med list.               acetaminophen 650 MG CR tablet  Commonly known as:  TYLENOL  Take 650 mg by mouth every 4 (four) hours as needed for pain or fever.     amLODipine 5 MG tablet  Commonly known as:  NORVASC  Take 5 mg by mouth daily.     baclofen 10 MG tablet  Commonly known as:  LIORESAL  Take 10 mg by mouth 4 (four) times daily.     calcium carbonate 1250 (500 CA) MG tablet  Commonly known as:  OS-CAL - dosed in mg of elemental calcium  Take 1 tablet by mouth 2 (two) times daily with a  meal. 500mg  elemental     dicyclomine 20 MG tablet  Commonly known as:  BENTYL  Take 10 mg by mouth 3 (three) times daily.     dipyridamole-aspirin 200-25 MG per 12 hr capsule  Commonly known as:  AGGRENOX  Take 1 capsule by mouth 2 (two) times daily.     docusate sodium 100 MG capsule  Commonly known as:  COLACE  Take 100 mg by mouth 2 (two) times daily.     feeding supplement (PRO-STAT SUGAR FREE 64) Liqd  Take 30 mLs by mouth daily.     furosemide 20 MG tablet  Commonly known as:  LASIX  Take 20 mg by mouth daily.     gabapentin 100 MG capsule  Commonly known as:  NEURONTIN  Take 100 mg by mouth 3 (three) times daily.     loratadine 10 MG tablet  Commonly known as:  CLARITIN  Take 10 mg by mouth daily as needed for allergies.     metoprolol tartrate 25 MG tablet  Commonly known as:  LOPRESSOR  Take 0.5 tablets (12.5 mg total) by mouth 2 (two) times daily.     nitroGLYCERIN 0.4 MG SL tablet  Commonly known as:  NITROSTAT  Place 0.4 mg under the tongue every 5 (five) minutes as needed for chest pain.     NUEDEXTA 20-10 MG Caps  Generic drug:  Dextromethorphan-Quinidine  Take 1 capsule  by mouth daily.     omeprazole 20 MG capsule  Commonly known as:  PRILOSEC  Take 20 mg by mouth daily.     oxyCODONE-acetaminophen 5-325 MG per tablet  Commonly known as:  ROXICET  Take one tablet by mouth every 4 hours as needed for severe pain. Do not exceed 4 grams in 24 hours     PREPLUS 27-1 MG Tabs  Take 1 tablet by mouth 2 (two) times daily.     promethazine 25 MG tablet  Commonly known as:  PHENERGAN  Take 25 mg by mouth every 6 (six) hours as needed for nausea or vomiting.     simvastatin 10 MG tablet  Commonly known as:  ZOCOR  3 by mouth daily for a total of 30 mg daily     Vitamin D3 2000 UNITS Tabs  Take by mouth daily.     XEOMIN IM  Inject 300 Units into the muscle every 3 (three) months.        Physical exam BP 106/62 mmHg  Pulse 63  Temp(Src) 97.2  F (36.2 C)  Resp 18  Wt 158 lb 6.4 oz (71.85 kg)  SpO2 95%  Wt Readings from Last 3 Encounters:  12/10/14 158 lb 6.4 oz (71.85 kg)  11/02/14 151 lb (68.493 kg)  09/28/14 158 lb 11.2 oz (71.986 kg)   General- elderly male in no acute distress Head- atraumatic, normocephalic Eyes- PERRLA, EOMI, no pallor, no icterus, no discharge Neck- no cervical lymphadenopathy Nose- no sinus tenderness, no nasal drainage Throat- moist mucus membrane, normal oropharynx Cardiovascular- normal s1,s2, no murmurs, normal dorsalis pedis Respiratory- bilateral clear to auscultation, no wheeze, no rhonchi, no crackles, no use of accessory muscles Abdomen- bowel sounds present, soft, non tender, no guarding or rigidity, no CVA tenderness Musculoskeletal- left sided hemiplegia, normal strength on right side, trace left leg edema Neurological- no focal deficit Skin- warm and dry Psychiatry- alert and oriented to person, place and time, normal mood and affect  Labs reviewed 07/22/14 wbc 5, hb 13.9, plt 154, na 140, k 3.9, bun 23, cr 0.84, glu 23, ca 8.6, alp 76, psa 3.4, t.chol 204, tg 146, ldl 140, hdl 35, tsh 0.284 11/30/14 wbc 5.5, hb 13, plt 200, na 140, k 4.1, bun 23, cr 0.83, glu 105, ca 8.7  Assessment/plan  Hypertensive hear diease Stable, continue norvasc 5 mg daily, metorpolol 12.5 mg boid and lasix, reviewed bmp. Monitor bp  CHF Stable, appears euvolemic, continue lasix 20 mg daily and metoprolol 12.5 mg bid  Angina pectoris Remains chest pain free. Continue metoprolol 12.5 mg bid with aggrenox and prn NTG. Continue zocor   Blanchie Serve, MD  [facility] [phone] (Monday-Friday 8 am - 5 pm) [phone] (afterhours)

## 2014-12-21 LAB — HEPATIC FUNCTION PANEL
ALT: 14 U/L (ref 10–40)
AST: 15 U/L (ref 14–40)
Alkaline Phosphatase: 86 U/L (ref 25–125)
Bilirubin, Total: 0.3 mg/dL

## 2015-01-13 ENCOUNTER — Encounter: Payer: Self-pay | Admitting: Internal Medicine

## 2015-01-13 ENCOUNTER — Non-Acute Institutional Stay (SKILLED_NURSING_FACILITY): Payer: Medicare Other | Admitting: Internal Medicine

## 2015-01-13 DIAGNOSIS — I110 Hypertensive heart disease with heart failure: Secondary | ICD-10-CM

## 2015-01-13 DIAGNOSIS — I509 Heart failure, unspecified: Secondary | ICD-10-CM

## 2015-01-13 DIAGNOSIS — K219 Gastro-esophageal reflux disease without esophagitis: Secondary | ICD-10-CM

## 2015-01-13 DIAGNOSIS — I69359 Hemiplegia and hemiparesis following cerebral infarction affecting unspecified side: Secondary | ICD-10-CM | POA: Diagnosis not present

## 2015-01-13 DIAGNOSIS — K589 Irritable bowel syndrome without diarrhea: Secondary | ICD-10-CM | POA: Diagnosis not present

## 2015-01-13 NOTE — Progress Notes (Signed)
Patient ID: Gregory Kerr, male   DOB: 11-Oct-1940, 74 y.o.   MRN: [id number]      Facility: [facility] - oputm care  Chief complaint: medical management of chronic isssues  Allergy: PCN  Code status- full code  HPI  74 y/o male patient is seen today for routine visit. He has been having loose/soft stool intermittently for a month, one episode a day. Then there are days when he is constipated. He denies any abdominal cramps, pain, nausea or vomiting associated with his bowel movement. He has history of IBS. He denies any other concerns for today. No falls reported. No new skin concerns. He has pmh of cva, HLD, HTN among others. No new concerns from the staff.   Review of Systems Constitutional: Negative for malaise/fatigue and diaphoresis.   HENT: Negative for congestion, hearing loss, earache and sore throat.    Respiratory: Negative for cough, sputum production, shortness of breath and wheezing.    Cardiovascular: Negative for chest pain, palpitations, orthopnea and leg swelling.   Gastrointestinal: Negative for heartburn, nausea, vomiting, abdominal pain. Denies melena or rectal bleed Genitourinary: Negative for dysuria Musculoskeletal: Negative for falls, joint pain and myalgias. Has muscle spasticity. Gets routine botox injections from neurology Skin: Negative for itching, rash.   Neurological: Negative for new weakness,dizziness, tingling, headaches.   Psychiatric/Behavioral: Negative for depression, insomnia and memory loss. The patient is not nervous/anxious.    Past medical history reviewed  Medications reviewed   Medication List       This list is accurate as of: 01/13/15  5:09 PM.  Always use your most recent med list.               acetaminophen 650 MG CR tablet  Commonly known as:  TYLENOL  Take 650 mg by mouth every 4 (four) hours as needed for pain or fever.     amLODipine 5 MG tablet  Commonly known as:  NORVASC  Take one tablet  by mouth once daily for blood pressure     baclofen 10 MG tablet  Commonly known as:  LIORESAL  Take one tablet by mouth four times daily for muscle spasm     calcium carbonate 1250 (500 CA) MG tablet  Commonly known as:  OS-CAL - dosed in mg of elemental calcium  Take 1 tablet by mouth 2 (two) times daily with a meal. 500mg  elemental     dicyclomine 20 MG tablet  Commonly known as:  BENTYL  Take 1/2 tablet by mouth three times daily for IBS     dipyridamole-aspirin 200-25 MG per 12 hr capsule  Commonly known as:  AGGRENOX  Take one capsule by mouth twice daily for CVA     furosemide 20 MG tablet  Commonly known as:  LASIX  Take one tablet by mouth once daily for CHF     gabapentin 100 MG capsule  Commonly known as:  NEURONTIN  Take 100 mg by mouth 3 (three) times daily.     loratadine 10 MG tablet  Commonly known as:  CLARITIN  Take 10 mg by mouth daily as needed for allergies.     metoprolol tartrate 25 MG tablet  Commonly known as:  LOPRESSOR  Take 0.5 tablets (12.5 mg total) by mouth 2 (two) times daily.     nitroGLYCERIN 0.4 MG SL tablet  Commonly known as:  NITROSTAT  Place 0.4 mg under the tongue every 5 (five) minutes as needed for chest pain.  NUEDEXTA 20-10 MG Caps  Generic drug:  Dextromethorphan-Quinidine  Take one capsule by mouth once daily for PBA     omeprazole 20 MG capsule  Commonly known as:  PRILOSEC  Take one capsule by mouth once daily for stomach     oxyCODONE-acetaminophen 5-325 MG per tablet  Commonly known as:  ROXICET  Take one tablet by mouth every 4 hours as needed for severe pain. Do not exceed 4 grams in 24 hours     polyethylene glycol packet  Commonly known as:  MIRALAX / GLYCOLAX  Mix 17gm in 4oz of liquid and take by mouth twice daily for constipation     PREPLUS 27-1 MG Tabs  Take 1 tablet by mouth 2 (two) times daily.     promethazine 25 MG tablet  Commonly known as:  PHENERGAN  Take 25 mg by mouth every 6 (six) hours  as needed for nausea or vomiting.     simvastatin 10 MG tablet  Commonly known as:  ZOCOR  Take one tablet by mouth every evening with 20mg  tablet to =30mg  for cholesterol     simvastatin 20 MG tablet  Commonly known as:  ZOCOR  Take one tablet by mouth every evening with 10mg  to =30mg  for cholesterol     Vitamin D3 2000 UNITS Tabs  Take by mouth daily.        Physical exam BP 100/58 mmHg  Pulse 60  Temp(Src) 97.2 F (36.2 C) (Oral)  Resp 18  Ht 5\' 9"  (1.753 m)  Wt 156 lb 6.4 oz (70.943 kg)  BMI 23.09 kg/m2  Wt Readings from Last 3 Encounters:  01/13/15 156 lb 6.4 oz (70.943 kg)  12/10/14 158 lb 6.4 oz (71.85 kg)  11/02/14 151 lb (68.493 kg)   General- elderly male in no acute distress Head- atraumatic, normocephalic Eyes- PERRLA, EOMI, no pallor, no icterus, no discharge Neck- no cervical lymphadenopathy Nose- no sinus tenderness, no nasal drainage Throat- moist mucus membrane, normal oropharynx Cardiovascular- normal s1,s2, no murmurs, normal dorsalis pedis Respiratory- bilateral clear to auscultation, no wheeze, no rhonchi, no crackles, no use of accessory muscles Abdomen- bowel sounds present, soft, non tender, no guarding or rigidity, no CVA tenderness Musculoskeletal- left sided hemiplegia, normal strength on right side, trace left leg edema Neurological- no focal deficit Skin- warm and dry Psychiatry- alert and oriented to person, place and time, normal mood and affect  Labs reviewed 07/22/14 wbc 5, hb 13.9, plt 154, na 140, k 3.9, bun 23, cr 0.84, glu 23, ca 8.6, alp 76, psa 3.4, t.chol 204, tg 146, ldl 140, hdl 35, tsh 0.284 11/30/14 wbc 5.5, hb 13, plt 200, na 140, k 4.1, bun 23, cr 0.83, glu 105, ca 8.7 12/21/14 lft wnl  Assessment/plan  IBS Appears to have IBS related loose stool altered with constipation. Per history does not appear to be infectious or inflammatory in nature. Currently on bentyl 10 mg TID with miralax bid. Change miralax to daily and  increase bentyl to 20 mg in am and pm and 10 mg in noon and reassess. Recent LFT WNL. Is s/p cholecystectomy. Might benefit from bile sequestrant. If no improvement, will get gi referral for possible sigmoidoscopy vs colonoscopy  HTN Soft bp, on lower side. Currently on norvasc 5 mg daily, metorpolol 12.5 mg bid and lasix 20 mg daily. 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 Continue bp medication. Continue aggrenox and statin  gerd Stable, as  per pt currently symptom free. On prilosec 20 mg daily. Decrease this to 10 mg daily and if symptoms remain controlled, take him off PPI next month   Blanchie Serve, MD  [facility] [phone] (Monday-Friday 8 am - 5 pm) [phone] (afterhours)

## 2015-01-28 ENCOUNTER — Telehealth: Payer: Self-pay | Admitting: Neurology

## 2015-01-28 NOTE — Telephone Encounter (Signed)
Rosemary with [facility] is calling to schedule an appointment for Botox for the patient. Thank you.

## 2015-02-01 LAB — LIPID PANEL
Cholesterol: 145 mg/dL (ref 0–200)
HDL: 36 mg/dL (ref 35–70)
LDL Cholesterol: 93 mg/dL
Triglycerides: 82 mg/dL (ref 40–160)

## 2015-02-02 NOTE — Telephone Encounter (Signed)
Returned Rosemary's call but had to leave a VM asking her to return call.   CALL CENTER: When she returns the call would you please schedule the patient? It will be a XEOMIN INJ. With Dr. Krista Blue and it will need to be sometime in October so we have enough time to get the medication in. Thank you!!

## 2015-02-16 ENCOUNTER — Non-Acute Institutional Stay (SKILLED_NURSING_FACILITY): Payer: Medicare Other | Admitting: Internal Medicine

## 2015-02-16 DIAGNOSIS — I509 Heart failure, unspecified: Secondary | ICD-10-CM

## 2015-02-16 DIAGNOSIS — I110 Hypertensive heart disease with heart failure: Secondary | ICD-10-CM | POA: Diagnosis not present

## 2015-02-16 DIAGNOSIS — G8114 Spastic hemiplegia affecting left nondominant side: Secondary | ICD-10-CM | POA: Diagnosis not present

## 2015-02-16 DIAGNOSIS — I5032 Chronic diastolic (congestive) heart failure: Secondary | ICD-10-CM

## 2015-02-16 NOTE — Progress Notes (Signed)
Patient ID: Gregory Kerr, male   DOB: [birth year], 74 y.o.   MRN: [id number]     Facility: [facility] - [facility] care  Chief complaint: medical management of chronic isssues  Allergy: PCN  Code status- full code  HPI  74 y/o male patient is seen today for routine visit. He has been at his baseline with no new concerns. ongoing intermittent loose stool. No falls reported. No new skin concerns.   Review of Systems Constitutional: Negative for malaise/fatigue and diaphoresis.   HENT: Negative for congestion, hearing loss, earache and sore throat.    Respiratory: Negative for cough, sputum production, shortness of breath and wheezing.    Cardiovascular: Negative for chest pain, palpitations, orthopnea and leg swelling.   Gastrointestinal: Negative for heartburn, nausea, vomiting, abdominal pain. Denies melena or rectal bleed Genitourinary: Negative for dysuria Musculoskeletal: Negative for falls, joint pain and myalgias. Has muscle spasticity. Gets routine botox injections from neurology Skin: Negative for itching, rash.   Neurological: Negative for new weakness,dizziness, tingling, headaches.   Psychiatric/Behavioral: Negative for depression, insomnia and memory loss. The patient is not nervous/anxious.    Past medical history reviewed  Medications reviewed: Medication reviewed. See [facility]  Physical exam BP 131/70 mmHg  Pulse 65  Temp(Src) 98.9 F (37.2 C)  Resp 18  Wt 158 lb (71.668 kg)  SpO2 97%  Wt Readings from Last 3 Encounters:  02/16/15 158 lb (71.668 kg)  01/13/15 156 lb 6.4 oz (70.943 kg)  12/10/14 158 lb 6.4 oz (71.85 kg)   General- elderly male in no acute distress Head- atraumatic, normocephalic Eyes- PERRLA, EOMI, no pallor, no icterus, no discharge Neck- no cervical lymphadenopathy Nose- no sinus tenderness, no nasal drainage Throat- moist mucus membrane, normal oropharynx Cardiovascular- normal s1,s2, no murmurs, normal dorsalis  pedis Respiratory- bilateral clear to auscultation, no wheeze, no rhonchi, no crackles, no use of accessory muscles Abdomen- bowel sounds present, soft, non tender, no guarding or rigidity, no CVA tenderness Musculoskeletal- left sided hemiplegia, normal strength on right side, trace left leg edema Neurological- no focal deficit Skin- warm and dry Psychiatry- alert and oriented to person, place and time, normal mood and affect  Labs reviewed 07/22/14 wbc 5, hb 13.9, plt 154, na 140, k 3.9, bun 23, cr 0.84, glu 23, ca 8.6, alp 76, psa 3.4, t.chol 204, tg 146, ldl 140, hdl 35, tsh 0.284 11/30/14 wbc 5.5, hb 13, plt 200, na 140, k 4.1, bun 23, cr 0.83, glu 105, ca 8.7 12/21/14 lft wnl  Assessment/plan  HTN Stable. Continue metorpolol 12.5 mg bid and lasix 20 mg daily. 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 Continue baclofen    Blanchie Serve, MD  [facility] [phone] (Monday-Friday 8 am - 5 pm) [phone] (afterhours)

## 2015-02-26 ENCOUNTER — Telehealth: Payer: Self-pay | Admitting: Neurology

## 2015-02-26 DIAGNOSIS — G8114 Spastic hemiplegia affecting left nondominant side: Secondary | ICD-10-CM

## 2015-02-26 NOTE — Telephone Encounter (Signed)
Spoke with the Gregory Kerr and they stated that it was still pending, informed them the patient had an apt on 10/13 they noted on the account that they would work on getting delivery by 10/11. Will call back Monday to check status.

## 2015-03-02 ENCOUNTER — Encounter: Payer: Self-pay | Admitting: *Deleted
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
## 2015-03-04 ENCOUNTER — Ambulatory Visit (INDEPENDENT_AMBULATORY_CARE_PROVIDER_SITE_OTHER): Payer: Medicare Other | Admitting: Neurology

## 2015-03-04 ENCOUNTER — Encounter: Payer: Self-pay | Admitting: Neurology

## 2015-03-04 ENCOUNTER — Ambulatory Visit: Payer: Medicare Other | Admitting: Neurology

## 2015-03-04 ENCOUNTER — Encounter: Payer: Self-pay | Admitting: *Deleted

## 2015-03-04 VITALS — BP 125/69 | HR 58

## 2015-03-04 DIAGNOSIS — G8114 Spastic hemiplegia affecting left nondominant side: Secondary | ICD-10-CM

## 2015-03-04 DIAGNOSIS — G8314 Monoplegia of lower limb affecting left nondominant side: Secondary | ICD-10-CM
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
## 2015-03-04 NOTE — Progress Notes (Signed)
**  Xeomin 100 units x 3, Lot [id number], Exp 01/2017, [place] [id number], [facility]**mck,rn

## 2015-03-23 ENCOUNTER — Encounter: Payer: Self-pay | Admitting: *Deleted

## 2015-03-25 NOTE — Telephone Encounter (Signed)
Error

## 2015-05-20 LAB — BASIC METABOLIC PANEL
BUN: 19 mg/dL (ref 4–21)
CREATININE: 0.8 mg/dL (ref 0.6–1.3)
GLUCOSE: 92 mg/dL
POTASSIUM: 3.9 mmol/L (ref 3.4–5.3)
SODIUM: 142 mmol/L (ref 137–147)

## 2015-05-20 LAB — CBC AND DIFFERENTIAL
HCT: 44 % (ref 41–53)
HEMOGLOBIN: 14.1 g/dL (ref 13.5–17.5)
Platelets: 196 10*3/uL (ref 150–399)
WBC: 6 10^3/mL

## 2015-06-08 ENCOUNTER — Ambulatory Visit (INDEPENDENT_AMBULATORY_CARE_PROVIDER_SITE_OTHER): Payer: Medicare Other | Admitting: Neurology

## 2015-06-08 ENCOUNTER — Ambulatory Visit: Payer: Medicare Other | Admitting: Neurology

## 2015-06-08 ENCOUNTER — Encounter: Payer: Self-pay | Admitting: Neurology

## 2015-06-08 VITALS — BP 117/69 | HR 73

## 2015-06-08 DIAGNOSIS — G8114 Spastic hemiplegia affecting left nondominant side: Secondary | ICD-10-CM

## 2015-06-08 NOTE — Progress Notes (Signed)
**  Xeomin 300units, Lot [id number], Exp 01/2017, [place] [id number], office supply for this injection only**mck,rn

## 2015-06-08 NOTE — Telephone Encounter (Signed)
Called [facility] and requested refill. They are waiting on patient consent and will ship to our office.

## 2015-06-08 NOTE — Progress Notes (Signed)
HPI:   Gregory Kerr is a 76 year old right-handed Caucasian male, came in with his sister, he previously received EMG guided Botulinum toxin injection for his spastic left hemiparesis.   Gregory Kerr  has suffered  stroke in 2007, with residual spastice left spastic hemiparesis, at home,  he has limited functional status, was able to use hemiwalker transfer himself in and out of wheelchair, wearing left foot ankle brace, hew as able to ambulate some.  he was started on Botox injection since 2011.,  He reported good response with the injection, the injection was done to loosen up his left upper extremity, it started to wear off in 8-10 weeks.  He also has past medical history of hypertension, anemia,     He has forceful  left finger/wrist flexion, limited ROM of left elbow, but he deines signficiant pain. He also has left knee extension, left ankle plantar flexion   UPDATE July 1st 2015: Last injection was in Oct 2014.  His sister is concerned about the the potential benefit vs long term side effect of repeated botulinum toxin injections. And patient is concerned about needle pain that is associated with the injection.  He did indicate that previous injections have been very helpful in relaxing his left shoulder, left elbow, left wrist and fingers, he did not notice significant side effect, desires further treatment.  UPDATE Sept 2nd 2015: He noticed increased specificity of his left arm, now he is quite few months out from previous injection, especially tightness of his left elbow, left shoulder, he denies significant benefit from previous injection to his left lower extremity, also worry about the side effect of causing weakness in his left hand  UPDATE Dec 2nd 2015: Last injection was in Sept 2nd 2015, works well for him, the injection focused at his left arm now.  He does not want to have injection to his left leg, he still uses his left finger to grip,  UPDATE Oct 14 2014: Last EMG guided xeomin  injection was in December second 2015, he had cholecystectomy just few weeks ago, recovering well, he wants this injection emphasized in his spastic left shoulder, elbow, wrist, he still uses his left hand to grip, supporting, does not want a week his left fingers too much  UPDATE Jun 08 2015: Last injection was March 03 2016, he tolerated the injection well, he was able to straight out his left arm better, now he complains of fast file left shoulder abduction left elbow flexion pronation, he still uses his left hand for grip and support,  Review of the system: Out of a complete 14 system review, the patient complains of only the following symptoms, and all other reviewed systems are negative.     Physical Exam  General: well nourished, well hydrated, no acute distress Head: normocephalic  Neck: supple, no masses, trachea midline Respiratory: clear to auscultation bilaterally. Cardiovascular: RRR   Neurologic Exam  Mental Status: in wheel chair, pleasant. mild slurred hoarse speeach Cranial Nerves: mild left lower face weakness. Motor: spastic left hemiparesis. Left arm stayed in left shoulder internal rotation, addcution, left forearm pronation, shoulder abduction 3, elbow flexion 3, left wrist forceful flexed position, maximum 150 degree,  left elbow fixed flexion, pronation. maximum extension 160 degree. Left leg extension at knee, left ankle fixed plantar inversion, wear left ankle boot   Coordination: normal right finger to nose at right side Gait and Station: deferred    Assessment and Plan:   75 yo male with history of stroke, spastic  left hemiparesis, return for EMG guided botulism toxin injection for his spastic left upper extremity  Under EMG guidance, He could not tolerate electrical stimulation in the past.  300 units of xeomin was injected into his spastic left upper extremity  Left biceps 25x2=50 Left brachialis 25x 4= 100 Left pronator teres 25 Left flexor carpi  ulnaris 25 units  Left pectoralis major 50  Left teres major 50  ill return to clinic in 3 months for repeat injection   Marcial Pacas, M.D. Ph.D.  [facility] [place], [place] [address] Phone: [phone] Fax:      [phone]

## 2015-06-18 ENCOUNTER — Non-Acute Institutional Stay (SKILLED_NURSING_FACILITY): Payer: Medicare Other | Admitting: Internal Medicine

## 2015-06-18 ENCOUNTER — Encounter: Payer: Self-pay | Admitting: Internal Medicine

## 2015-06-18 DIAGNOSIS — I5032 Chronic diastolic (congestive) heart failure: Secondary | ICD-10-CM

## 2015-06-18 DIAGNOSIS — G8114 Spastic hemiplegia affecting left nondominant side: Secondary | ICD-10-CM | POA: Diagnosis not present

## 2015-06-18 DIAGNOSIS — E785 Hyperlipidemia, unspecified: Secondary | ICD-10-CM | POA: Diagnosis not present

## 2015-06-18 DIAGNOSIS — I10 Essential (primary) hypertension: Secondary | ICD-10-CM | POA: Diagnosis not present

## 2015-06-18 DIAGNOSIS — K589 Irritable bowel syndrome without diarrhea: Secondary | ICD-10-CM

## 2015-06-18 DIAGNOSIS — I69359 Hemiplegia and hemiparesis following cerebral infarction affecting unspecified side: Secondary | ICD-10-CM | POA: Diagnosis not present

## 2015-06-18 NOTE — Progress Notes (Signed)
Patient ID: Gregory Kerr, male   DOB: 08/23/1940, 75 y.o.   MRN: [id number]     Facility: [facility] - [facility] care  Chief complaint: medical management of chronic isssues  Allergy: PCN  Code status- Full Code  HPI  75 y/o male patient is seen today for routine visit. He has been seen by GI and notes reviewed. He is on dicyclomine and probiotic. He has been at his baseline with no new concerns. No falls reported. No new skin concerns.   Review of Systems Constitutional: Negative for malaise/fatigue and diaphoresis.   HENT: Negative for congestion, hearing loss, earache and sore throat.    Respiratory: Negative for cough, sputum production, shortness of breath and wheezing.    Cardiovascular: Negative for chest pain, palpitations, orthopnea and leg swelling.   Gastrointestinal: Negative for heartburn, nausea, vomiting, abdominal pain. Denies melena or rectal bleed Genitourinary: Negative for dysuria Musculoskeletal: Negative for falls, joint pain and myalgias. Has muscle spasticity. Gets routine botox injections from neurology Skin: Negative for itching, rash.   Neurological: Negative for new weakness,dizziness, tingling, headaches.   Psychiatric/Behavioral: Negative for depression, insomnia and memory loss. The patient is not nervous/anxious.    Past medical history reviewed  Medications reviewed:   Medication List       This list is accurate as of: 06/18/15  2:03 PM.  Always use your most recent med list.               baclofen 10 MG tablet  Commonly known as:  LIORESAL  Take one tablet by mouth four times daily for muscle spasm     calcium carbonate 1250 (500 Ca) MG tablet  Commonly known as:  OS-CAL - dosed in mg of elemental calcium  Take 1 tablet by mouth 2 (two) times daily with a meal. 500mg  elemental     D3-1000 1000 units capsule  Generic drug:  Cholecalciferol  Take 1,000 Units by mouth daily.     dicyclomine 10 MG capsule    Commonly known as:  BENTYL  Take by mouth. Give one tablet daily at 2 PM then 1 tablet twice daily     diphenoxylate-atropine 2.5-0.025 MG tablet  Commonly known as:  LOMOTIL  Take 1 tablet by mouth 3 (three) times daily as needed for diarrhea or loose stools.     dipyridamole-aspirin 200-25 MG 12hr capsule  Commonly known as:  AGGRENOX  Take one capsule by mouth twice daily for CVA     furosemide 20 MG tablet  Commonly known as:  LASIX  Take one tablet by mouth once daily for CHF     gabapentin 100 MG capsule  Commonly known as:  NEURONTIN  Take 100 mg by mouth 3 (three) times daily.     loperamide 2 MG capsule  Commonly known as:  IMODIUM  4 mg at initial dose then 2 mg after each loose stool     metoprolol tartrate 25 MG tablet  Commonly known as:  LOPRESSOR  Take 0.5 tablets (12.5 mg total) by mouth 2 (two) times daily.     nitroGLYCERIN 0.4 MG SL tablet  Commonly known as:  NITROSTAT  Place 0.4 mg under the tongue every 5 (five) minutes as needed for chest pain.     NUEDEXTA 20-10 MG Caps  Generic drug:  Dextromethorphan-Quinidine  Take one capsule by mouth once daily for PBA     oxycodone 5 MG capsule  Commonly known as:  OXY-IR  Take 5  mg by mouth every 4 (four) hours as needed for pain.     PREPLUS 27-1 MG Tabs  Take 1 tablet by mouth 2 (two) times daily.     Probiotic 250 MG Caps  Take by mouth daily. For IBS     simvastatin 20 MG tablet  Commonly known as:  ZOCOR  Take one tablet by mouth every evening for cholesterol        Physical exam BP 126/70 mmHg  Pulse 81  Temp(Src) 97.8 F (36.6 C) (Oral)  Resp 18  Ht 5\' 9"  (1.753 m)  Wt 160 lb 12.8 oz (72.938 kg)  BMI 23.74 kg/m2  SpO2 98%  Wt Readings from Last 3 Encounters:  06/18/15 160 lb 12.8 oz (72.938 kg)  02/16/15 158 lb (71.668 kg)  01/13/15 156 lb 6.4 oz (70.943 kg)   General- elderly male in no acute distress Head- atraumatic, normocephalic Eyes- PERRLA, EOMI, no pallor, no  icterus, no discharge Neck- no cervical lymphadenopathy Nose- no sinus tenderness, no nasal drainage Throat- moist mucus membrane, normal oropharynx Cardiovascular- normal s1,s2, no murmurs, normal dorsalis pedis Respiratory- bilateral clear to auscultation, no wheeze, no rhonchi, no crackles, no use of accessory muscles Abdomen- bowel sounds present, soft, non tender, no guarding or rigidity, no CVA tenderness Musculoskeletal- left sided hemiplegia, normal strength on right side Neurological- no focal deficit Skin- warm and dry Psychiatry- alert and oriented to person, place and time, normal mood and affect  Labs reviewed CBC Latest Ref Rng 05/20/2015 11/30/2014 10/06/2014  WBC - 6.0 5.5 13.1  Hemoglobin 13.5 - 17.5 g/dL 14.1 13.0(A) 11.4(A)  Hematocrit 41 - 53 % 44 40(A) 34(A)  Platelets 150 - 399 K/L 196 200 378   CMP Latest Ref Rng 05/20/2015 12/21/2014 11/30/2014  Glucose 70 - 99 mg/dL - - -  BUN 4 - 21 mg/dL 19 - 23(A)  Creatinine 0.6 - 1.3 mg/dL 0.8 - 0.8  Sodium 137 - 147 mmol/L 142 - 140  Potassium 3.4 - 5.3 mmol/L 3.9 - 4.1  Chloride 101 - 111 mmol/L - - -  CO2 22 - 32 mmol/L - - -  Calcium 8.9 - 10.3 mg/dL - - -  Total Protein 6.5 - 8.1 g/dL - - -  Total Bilirubin 0.3 - 1.2 mg/dL - - -  Alkaline Phos 25 - 125 U/L - 86 -  AST 14 - 40 U/L - 15 -  ALT 10 - 40 U/L - 14 -     Assessment/plan  IBS Reviewed gi note. Continue dicyclomine and probiotic. Monitor symptom  Hyperlipidemia Lipid Panel     Component Value Date/Time   CHOL 145 02/01/2015   TRIG 82 02/01/2015   HDL 36 02/01/2015   CHOLHDL 7.7 04/04/2014 0353   VLDL 29 04/04/2014 0353   LDLCALC 93 02/01/2015  LDL at goal. Continue zocor 20 mg daily  CVA With spastic hemiplegia. Continue aggrenox, baclofen and to f/u with neurology for botox injection.  HTN Stable. Continue metorpolol 12.5 mg bid and lasix 20 mg daily. Monitor BP  CHF Stable, appears euvolemic, continue lasix 20 mg daily and metoprolol  12.5 mg bid  Spastic hemiplegia  On non dominant side, stable, continue periodic neurology appointment for botox injection. Continue baclofen   Blanchie Serve, MD  [facility] [phone] (Monday-Friday 8 am - 5 pm) [phone] (afterhours)

## 2015-07-02 LAB — LIPID PANEL
CHOLESTEROL: 152 mg/dL (ref 0–200)
HDL: 35 mg/dL (ref 35–70)
LDL Cholesterol: 97 mg/dL
TRIGLYCERIDES: 99 mg/dL (ref 40–160)

## 2015-07-02 LAB — CBC AND DIFFERENTIAL
HCT: 44 % (ref 41–53)
Hemoglobin: 14.6 g/dL (ref 13.5–17.5)
Platelets: 174 10*3/uL (ref 150–399)
WBC: 5.8 10^3/mL

## 2015-07-02 LAB — HEMOGLOBIN A1C: HEMOGLOBIN A1C: 5.7

## 2015-07-02 LAB — TSH: TSH: 2.73 u[IU]/mL (ref 0.41–5.90)

## 2015-07-16 ENCOUNTER — Non-Acute Institutional Stay (SKILLED_NURSING_FACILITY): Payer: Medicare Other | Admitting: Internal Medicine

## 2015-07-16 ENCOUNTER — Encounter: Payer: Self-pay | Admitting: Internal Medicine

## 2015-07-16 DIAGNOSIS — F419 Anxiety disorder, unspecified: Secondary | ICD-10-CM | POA: Diagnosis not present

## 2015-07-16 DIAGNOSIS — K589 Irritable bowel syndrome without diarrhea: Secondary | ICD-10-CM | POA: Diagnosis not present

## 2015-07-16 DIAGNOSIS — E785 Hyperlipidemia, unspecified: Secondary | ICD-10-CM | POA: Insufficient documentation

## 2015-07-16 NOTE — Progress Notes (Signed)
Patient ID: Gregory Kerr, male   DOB: 1940-07-10, 75 y.o.   MRN: [id number]    Location:  [facility] Room Number: 803- A Place of Service:  SNF (31)   Blanchie Serve, MD  Patient Care Team: Blanchie Serve, MD as PCP - General (Internal Medicine)  Extended Emergency Contact Information Primary Emergency Contact: [name],[name]          [name] Phone: [phone] Mobile Phone: [phone] Relation: Sister  Code Status:  Full code  Goals of care: Advanced Directive information Advanced Directives 03/23/2015  Does patient have an advance directive? Yes  Type of Advance Directive -  Does patient want to make changes to advanced directive? -  Copy of advanced directive(s) in chart? Yes  Would patient like information on creating an advanced directive? -     Chief Complaint  Patient presents with  . Medical Management of Chronic Issues    Routine Visit    Allergies  Allergen Reactions  . Penicillins Rash    HPI:  Pt is a 75 y.o. male seen today for medical management of chronic diseases.  He continues to have loose stool though the frequency is subsided some. He feels this to be affecting his quality of life. He mentions being somewhat anxious lately and does not like his room mates behavior towards staff. he is on dicyclomine and probiotic. He has been at his baseline otherwise with no other concerns. No falls reported. No new skin concerns.   Review of Systems Constitutional: Negative for fever, chills, diaphoresis.   HENT: Negative for congestion, hearing loss, earache and sore throat.    Respiratory: Negative for cough, shortness of breath and wheezing.    Cardiovascular: Negative for chest pain, palpitations, leg swelling.   Gastrointestinal: Negative for heartburn, nausea, vomiting, abdominal pain. Denies melena or rectal bleed Genitourinary: Negative for dysuria Musculoskeletal: Negative for  fall. Has muscle spasticity. Gets routine botox injections from neurology Skin: Negative for itching, rash.   Neurological: Negative for new weakness,dizziness Psychiatric/Behavioral: Negative for depression    Past Medical History  Diagnosis Date  . GERD (gastroesophageal reflux disease)   . Hypertension   . Hernia     "large one; on my left stomach" (09/22/2014)  . Hyperlipidemia   . HTN (hypertension) 10/24/2012  . Osteoporosis, unspecified 10/24/2012  . 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 Hiatal hernia 10/24/2012  . Pneumonia     "once or twice" (09/22/2014)  . Stroke [facility]) 2007    "left side doesn't work now" (09/22/2014)   Past Surgical History  Procedure Laterality Date  . Appendectomy  05/27/2009    Ronnald Collum, MD  . Cataract extraction w/ intraocular lens  implant, bilateral Bilateral ?2014  . Ercp N/A 09/25/2014    Procedure: ENDOSCOPIC RETROGRADE CHOLANGIOPANCREATOGRAPHY (ERCP);  Surgeon: Clarene Essex, MD;  Location: [facility] ENDOSCOPY;  Service: Endoscopy;  Laterality: N/A;  . Cholecystectomy N/A 09/26/2014    Procedure: LAPAROSCOPIC CHOLECYSTECTOMY WITH INTRAOPERATIVE CHOLANGIOGRAM;  Surgeon: Donnie Mesa, MD;  Location: [facility];  Service: General;  Laterality: N/A;       Medication List       This list is accurate as of: 07/16/15  4:57 PM.  Always use your most recent med list.               baclofen 10 MG tablet  Commonly known as:  LIORESAL  Take one tablet by mouth four times  daily for muscle spasm     calcium carbonate 1250 (500 Ca) MG tablet  Commonly known as:  OS-CAL - dosed in mg of elemental calcium  Take 1 tablet by mouth 2 (two) times daily with a meal. 500mg  elemental     D3-1000 1000 units capsule  Generic drug:  Cholecalciferol  Take 1,000 Units by mouth daily.     dicyclomine 10 MG capsule  Commonly known as:  BENTYL  Take by mouth. Give one tablet daily at 2 PM then 1 tablet twice daily     diphenoxylate-atropine  2.5-0.025 MG tablet  Commonly known as:  LOMOTIL  Take 1 tablet by mouth 3 (three) times daily as needed for diarrhea or loose stools.     dipyridamole-aspirin 200-25 MG 12hr capsule  Commonly known as:  AGGRENOX  Take one capsule by mouth twice daily for CVA     furosemide 20 MG tablet  Commonly known as:  LASIX  Take one tablet by mouth once daily for CHF     gabapentin 100 MG capsule  Commonly known as:  NEURONTIN  Take 100 mg by mouth 3 (three) times daily.     metoprolol tartrate 25 MG tablet  Commonly known as:  LOPRESSOR  Take 0.5 tablets (12.5 mg total) by mouth 2 (two) times daily.     nitroGLYCERIN 0.4 MG SL tablet  Commonly known as:  NITROSTAT  Place 0.4 mg under the tongue every 5 (five) minutes as needed for chest pain.     NUEDEXTA 20-10 MG Caps  Generic drug:  Dextromethorphan-Quinidine  Take one capsule by mouth once daily for PBA     oxycodone 5 MG capsule  Commonly known as:  OXY-IR  Take 5 mg by mouth every 4 (four) hours as needed for pain.     PREPLUS 27-1 MG Tabs  Take 1 tablet by mouth 2 (two) times daily.     simvastatin 20 MG tablet  Commonly known as:  ZOCOR  Take one tablet by mouth every evening for cholesterol         Immunization History  Administered Date(s) Administered  . Influenza-Unspecified 03/25/2013, 02/19/2014, 03/03/2014, 03/03/2015  . PPD Test 01/31/2013, 02/02/2014, 01/26/2015  . Pneumococcal-Unspecified 03/30/2011   Pertinent  Health Maintenance Due  Topic Date Due  . PNA vac Low Risk Adult (2 of 2 - PCV13) 05/22/2016 (Originally 03/29/2012)  . COLONOSCOPY  05/23/2023 (Originally 05/11/1993)  . INFLUENZA VACCINE  12/21/2015    Physical exam: Filed Vitals:   07/16/15 1421  BP: 120/69  Pulse: 65  Resp: 18  Height: 5\' 9"  (1.753 m)  Weight: 160 lb 9.6 oz (72.848 kg)  SpO2: 93%   Body mass index is 23.71 kg/(m^2).  General- elderly male in no acute distress Head- atraumatic, normocephalic Eyes- PERRLA, EOMI,  no pallor, no icterus, no discharge Neck- no cervical lymphadenopathy Nose- no sinus tenderness, no nasal drainage Throat- moist mucus membrane, normal oropharynx Cardiovascular- normal s1,s2, no murmurs, normal dorsalis pedis Respiratory- bilateral clear to auscultation, no wheeze, no rhonchi, no crackles, no use of accessory muscles Abdomen- bowel sounds present, soft, non tender, no guarding or rigidity, no CVA tenderness Musculoskeletal- left sided hemiplegia, normal strength on right side Neurological- no focal deficit Skin- warm and dry Psychiatry- alert and oriented to person, place and time   Labs reviewed:  Recent Labs  09/24/14 0611 09/26/14 0721 09/27/14 0620 10/02/14 1055 11/30/14 05/20/15  NA 138 138 134* 141 140 142  K 4.1 4.7 3.9 4.3  4.1 3.9  CL 110 112* 103  --   --   --   CO2 19* 20* 21*  --   --   --   GLUCOSE 112* 77 114*  --   --   --   BUN 9 8 10 20  23* 19  CREATININE 0.63 0.69 0.75 0.8 0.8 0.8  CALCIUM 8.5* 7.8* 8.3*  --   --   --     Recent Labs  09/24/14 0611 09/26/14 0721 09/27/14 0620 10/02/14 1055 12/21/14  AST 38 63* 78* 26 15  ALT 75* 71* 86* 32 14  ALKPHOS 365* 284* 296* 237* 86  BILITOT 1.7* 2.2* 1.6*  --   --   PROT 6.1* 5.5* 6.0*  --   --   ALBUMIN 2.6* 2.2* 2.4*  --   --     Recent Labs  09/22/14 0543 09/23/14 0605 09/26/14 0721 09/27/14 0620  11/30/14 05/20/15 07/02/15  WBC 12.1* 6.4 5.1 8.7  < > 5.5 6.0 5.8  NEUTROABS 10.7*  --  3.5  --   --   --   --   --   HGB 14.3 13.3 11.5* 11.9*  < > 13.0* 14.1 14.6  HCT 42.3 39.8 35.8* 35.9*  < > 40* 44 44  MCV 87.0 87.3 89.7 88.6  --   --   --   --   PLT 216 206 197 200  < > 200 196 174  < > = values in this interval not displayed. Lab Results  Component Value Date   TSH 2.73 07/02/2015   Lab Results  Component Value Date   HGBA1C 5.7 07/02/2015   Lab Results  Component Value Date   CHOL 152 07/02/2015   HDL 35 07/02/2015   LDLCALC 97 07/02/2015   TRIG 99 07/02/2015    CHOLHDL 7.7 04/04/2014     Assessment/Plan  IBS Continue dicyclomine but change it to 20 mg tid for now. Continue probiotic. Monitor  Anxiety disorder Start sertraline 25 mg daily for now and monitor  Hyperlipidemia Reviewed lipid panel. ldl at goal. Continue simvastatin but decrease it to 10 mg daily   Blanchie Serve, MD Internal Medicine [facility] [place], [place] [address] Cell Phone (Monday-Friday 8 am - 5 pm): [phone] On Call: [phone] and follow prompts after 5 pm and on weekends Office Phone: [phone] Office Fax: [phone]

## 2015-08-12 ENCOUNTER — Encounter: Payer: Self-pay | Admitting: Internal Medicine

## 2015-08-12 ENCOUNTER — Non-Acute Institutional Stay (SKILLED_NURSING_FACILITY): Payer: Medicare Other | Admitting: Internal Medicine

## 2015-08-12 DIAGNOSIS — G8114 Spastic hemiplegia affecting left nondominant side: Secondary | ICD-10-CM | POA: Diagnosis not present

## 2015-08-12 DIAGNOSIS — I5032 Chronic diastolic (congestive) heart failure: Secondary | ICD-10-CM | POA: Diagnosis not present

## 2015-08-12 DIAGNOSIS — M792 Neuralgia and neuritis, unspecified: Secondary | ICD-10-CM

## 2015-08-12 DIAGNOSIS — I69359 Hemiplegia and hemiparesis following cerebral infarction affecting unspecified side: Secondary | ICD-10-CM

## 2015-08-12 DIAGNOSIS — F482 Pseudobulbar affect: Secondary | ICD-10-CM

## 2015-08-12 DIAGNOSIS — M818 Other osteoporosis without current pathological fracture: Secondary | ICD-10-CM | POA: Diagnosis not present

## 2015-08-12 DIAGNOSIS — F419 Anxiety disorder, unspecified: Secondary | ICD-10-CM | POA: Diagnosis not present

## 2015-08-12 DIAGNOSIS — K580 Irritable bowel syndrome with diarrhea: Secondary | ICD-10-CM

## 2015-08-12 DIAGNOSIS — Z0000 Encounter for general adult medical examination without abnormal findings: Secondary | ICD-10-CM

## 2015-08-12 DIAGNOSIS — Z1211 Encounter for screening for malignant neoplasm of colon: Secondary | ICD-10-CM | POA: Diagnosis not present

## 2015-08-12 LAB — BASIC METABOLIC PANEL
BUN: 23 mg/dL — AB (ref 4–21)
CREATININE: 0.9 mg/dL (ref 0.6–1.3)
Glucose: 82 mg/dL
POTASSIUM: 4.3 mmol/L (ref 3.4–5.3)
Sodium: 141 mmol/L (ref 137–147)

## 2015-08-12 LAB — LIPID PANEL
Cholesterol: 157 mg/dL (ref 0–200)
HDL: 38 mg/dL (ref 35–70)
LDL Cholesterol: 98 mg/dL
TRIGLYCERIDES: 109 mg/dL (ref 40–160)

## 2015-08-12 NOTE — Progress Notes (Signed)
Patient ID: Gregory Kerr, male   DOB: May 20, 1941, 75 y.o.   MRN: [id number]    Location:  [place] Room Number: 803-A Place of Service:  SNF (31)   Bubba Camp, [facility], MD  Patient Care Team: Blanchie Serve, MD as PCP - General (Internal Medicine)  Extended Emergency Contact Information Primary Emergency Contact: [name],[name]          [place] of [place] Phone: [phone] Mobile Phone: [phone] Relation: Sister  Code Status:  Full code  Goals of care: Advanced Directive information Advanced Directives 03/23/2015  Does patient have an advance directive? Yes  Type of Advance Directive -  Does patient want to make changes to advanced directive? -  Copy of advanced directive(s) in chart? Yes  Would patient like information on creating an advanced directive? -     Chief Complaint  Patient presents with  . Annual Exam    Annual Visit    Allergies  Allergen Reactions  . Penicillins Rash    HPI:  Pt is a 75 y.o. male seen today for annual visit. He had been refusing lab work and vital signs check. He agreed to his vitals being checked today. He had blood work this am. He would like to avoid frequent blood work. He continues to have loose stools but frequency has decreased. Denies any blood or mucus in stool. Denies any other concerns.   Review of Systems Constitutional: Negative for fever, chills, diaphoresis.   HENT: Negative for congestion, hearing loss, earache and sore throat.    Respiratory: Negative for cough, shortness of breath and wheezing.    Cardiovascular: Negative for chest pain, palpitations, leg swelling.   Gastrointestinal: Negative for heartburn, nausea, vomiting, abdominal pain. Denies melena or rectal bleed Genitourinary: Negative for dysuria Musculoskeletal: Negative for fall. Has muscle spasticity. Gets routine botox injections from neurology Skin: Negative for itching, rash.     Neurological: Negative for new weakness,dizziness Psychiatric/Behavioral: Negative for depression    Past Medical History  Diagnosis Date  . GERD (gastroesophageal reflux disease)   . Hypertension   . Hernia     "large one; on my left stomach" (09/22/2014)  . Hyperlipidemia   . HTN (hypertension) 10/24/2012  . Osteoporosis, unspecified 10/24/2012  . 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 Hiatal hernia 10/24/2012  . Pneumonia     "once or twice" (09/22/2014)  . Stroke [facility]) 2007    "left side doesn't work now" (09/22/2014)   Past Surgical History  Procedure Laterality Date  . Appendectomy  05/27/2009    Ronnald Collum, MD  . Cataract extraction w/ intraocular lens  implant, bilateral Bilateral ?2014  . Ercp N/A 09/25/2014    Procedure: ENDOSCOPIC RETROGRADE CHOLANGIOPANCREATOGRAPHY (ERCP);  Surgeon: Clarene Essex, MD;  Location: [facility] ENDOSCOPY;  Service: Endoscopy;  Laterality: N/A;  . Cholecystectomy N/A 09/26/2014    Procedure: LAPAROSCOPIC CHOLECYSTECTOMY WITH INTRAOPERATIVE CHOLANGIOGRAM;  Surgeon: Donnie Mesa, MD;  Location: [place];  Service: General;  Laterality: N/A;       Medication List       This list is accurate as of: 08/12/15  2:57 PM.  Always use your most recent med list.               baclofen 10 MG tablet  Commonly known as:  LIORESAL  Take one tablet by mouth four times daily for muscle spasm     calcium carbonate 1250 (500 Ca) MG tablet  Commonly known as:  OS-CAL - dosed in mg of elemental calcium  Take 1 tablet by mouth 2 (two) times daily with a meal. 500mg  elemental     Cholecalciferol 2000 units Tabs  Take 2,000 Units by mouth daily.     dicyclomine 20 MG tablet  Commonly known as:  BENTYL  Take 20 mg by mouth 3 (three) times daily.     diphenoxylate-atropine 2.5-0.025 MG tablet  Commonly known as:  LOMOTIL  Take 1 tablet by mouth 3 (three) times daily as needed for diarrhea or loose stools.     dipyridamole-aspirin 200-25 MG  12hr capsule  Commonly known as:  AGGRENOX  Take one capsule by mouth twice daily for CVA     furosemide 20 MG tablet  Commonly known as:  LASIX  Take one tablet by mouth once daily for CHF     gabapentin 100 MG capsule  Commonly known as:  NEURONTIN  Take 100 mg by mouth 3 (three) times daily.     metoprolol tartrate 25 MG tablet  Commonly known as:  LOPRESSOR  Take 0.5 tablets (12.5 mg total) by mouth 2 (two) times daily.     nitroGLYCERIN 0.4 MG SL tablet  Commonly known as:  NITROSTAT  Place 0.4 mg under the tongue every 5 (five) minutes as needed for chest pain.     NUEDEXTA 20-10 MG Caps  Generic drug:  Dextromethorphan-Quinidine  Take one capsule by mouth once daily for PBA     oxycodone 5 MG capsule  Commonly known as:  OXY-IR  Take 5 mg by mouth every 4 (four) hours as needed for pain.     PREPLUS 27-1 MG Tabs  Take 1 tablet by mouth 2 (two) times daily.     sertraline 25 MG tablet  Commonly known as:  ZOLOFT  Take 25 mg by mouth daily.     simvastatin 20 MG tablet  Commonly known as:  ZOCOR  Take one tablet by mouth every evening for cholesterol         Immunization History  Administered Date(s) Administered  . Influenza-Unspecified 03/25/2013, 02/19/2014, 03/03/2014, 03/03/2015  . PPD Test 01/31/2013, 02/02/2014, 01/26/2015  . Pneumococcal-Unspecified 03/30/2011   Pertinent  Health Maintenance Due  Topic Date Due  . PNA vac Low Risk Adult (2 of 2 - PCV13) 05/22/2016 (Originally 03/29/2012)  . COLONOSCOPY  05/23/2023 (Originally 05/11/1993)  . INFLUENZA VACCINE  12/21/2015    Physical exam: Filed Vitals:   08/12/15 1444  BP: 106/65  Pulse: 61  Temp: 97.1 F (36.2 C)  TempSrc: Oral  Resp: 19  Height: 5\' 9"  (1.753 m)  Weight: 160 lb 9.6 oz (72.848 kg)   Body mass index is 23.71 kg/(m^2).  General- elderly male in no acute distress Head- atraumatic, normocephalic Eyes- PERRLA, EOMI, no pallor, no icterus, no discharge Neck- no cervical  lymphadenopathy, no JVD Nose- no sinus tenderness, no nasal drainage Throat- moist mucus membrane, normal oropharynx Cardiovascular- normal s1,s2, no murmurs, normal dorsalis pedis Respiratory- bilateral clear to auscultation, no wheeze, no rhonchi, no crackles, no use of accessory muscles Abdomen- bowel sounds present, soft, non tender, no guarding or rigidity, no CVA tenderness Musculoskeletal- left sided hemiplegia, normal strength on right side Neurological- no focal deficit, left sided weakness post CVA Skin- warm and dry Psychiatry- alert and oriented to person, place and time   Labs reviewed:  Recent Labs  09/24/14 0611 09/26/14 0721 09/27/14 0620 10/02/14 1055 11/30/14 05/20/15  NA 138 138 134* 141 140  142  K 4.1 4.7 3.9 4.3 4.1 3.9  CL 110 112* 103  --   --   --   CO2 19* 20* 21*  --   --   --   GLUCOSE 112* 77 114*  --   --   --   BUN 9 8 10 20  23* 19  CREATININE 0.63 0.69 0.75 0.8 0.8 0.8  CALCIUM 8.5* 7.8* 8.3*  --   --   --     Recent Labs  09/24/14 0611 09/26/14 0721 09/27/14 0620 10/02/14 1055 12/21/14  AST 38 63* 78* 26 15  ALT 75* 71* 86* 32 14  ALKPHOS 365* 284* 296* 237* 86  BILITOT 1.7* 2.2* 1.6*  --   --   PROT 6.1* 5.5* 6.0*  --   --   ALBUMIN 2.6* 2.2* 2.4*  --   --     Recent Labs  09/22/14 0543 09/23/14 0605 09/26/14 0721 09/27/14 0620  11/30/14 05/20/15 07/02/15  WBC 12.1* 6.4 5.1 8.7  < > 5.5 6.0 5.8  NEUTROABS 10.7*  --  3.5  --   --   --   --   --   HGB 14.3 13.3 11.5* 11.9*  < > 13.0* 14.1 14.6  HCT 42.3 39.8 35.8* 35.9*  < > 40* 44 44  MCV 87.0 87.3 89.7 88.6  --   --   --   --   PLT 216 206 197 200  < > 200 196 174  < > = values in this interval not displayed. Lab Results  Component Value Date   TSH 2.73 07/02/2015   Lab Results  Component Value Date   HGBA1C 5.7 07/02/2015   Lab Results  Component Value Date   CHOL 152 07/02/2015   HDL 35 07/02/2015   LDLCALC 97 07/02/2015   TRIG 99 07/02/2015   CHOLHDL 7.7  04/04/2014   07/02/15 b12- 793 Ferritin 47.7  Assessment/Plan  Annual exam Get colorectal cancer screening. uptodate with immunization. Labs reviewed  Colon cancer screening Get FOBT X 3 to screen.   IBS Continue dicyclomine 20 mg tid for now. Continue probiotic. Monitor  Left sided spasticity Continue baclofen current regimen, to f/u with neurology routinely for botox injection, monitor clinically  PBA Post CVA. Stable, continue neudexta  Anxiety disorder Stable, continue sertraline 25 mg daily for now and monitor  Left sided hemiplegia  Post CVA, continue aggrenox and baclofen. Pain controlled. Change oxyIR to 5 mg q6h prn pain.  Neuropathic pain Stable, continue gabapentin   Hyperlipidemia Reviewed lipid panel. ldl at goal. Continue simvastatin 10 mg daily  Disuse osteoporosis Continue ca-vit D  CHF Stable, change lasix to 10 mg daily. Continue lopressor 12.5 mg bid and monitor bp once a week   Blanchie Serve, MD Internal Medicine [facility] [address] [place], [place] [address] Cell Phone (Monday-Friday 8 am - 5 pm): [phone] On Call: [phone] and follow prompts after 5 pm and on weekends Office Phone: [phone] Office Fax: [phone]

## 2015-09-06 ENCOUNTER — Ambulatory Visit: Payer: Medicare Other | Admitting: Neurology

## 2015-09-06 ENCOUNTER — Ambulatory Visit (INDEPENDENT_AMBULATORY_CARE_PROVIDER_SITE_OTHER): Payer: Medicare Other | Admitting: Neurology

## 2015-09-06 ENCOUNTER — Encounter: Payer: Self-pay | Admitting: Neurology

## 2015-09-06 VITALS — BP 112/72 | HR 78

## 2015-09-06 DIAGNOSIS — G8114 Spastic hemiplegia affecting left nondominant side: Secondary | ICD-10-CM | POA: Diagnosis not present

## 2015-09-06 DIAGNOSIS — G8314 Monoplegia of lower limb affecting left nondominant side: Secondary | ICD-10-CM

## 2015-09-06 NOTE — Progress Notes (Signed)
HPI:   Gregory Kerr is a 75 year old right-handed Caucasian male, came in with his sister, he previously received EMG guided Botulinum toxin injection for his spastic left hemiparesis.   Gregory Kerr  has suffered  stroke in 2007, with residual spastice left spastic hemiparesis, at home,  he has limited functional status, was able to use hemiwalker transfer himself in and out of wheelchair, wearing left foot ankle brace, hew as able to ambulate some.  he was started on Botox injection since 2011.,  He reported good response with the injection, the injection was done to loosen up his left upper extremity, it started to wear off in 8-10 weeks.  He also has past medical history of hypertension, anemia,     He has forceful  left finger/wrist flexion, limited ROM of left elbow, but he deines signficiant pain. He also has left knee extension, left ankle plantar flexion   UPDATE July 1st 2015: Last injection was in Oct 2014.  His sister is concerned about the the potential benefit vs long term side effect of repeated botulinum toxin injections. And patient is concerned about needle pain that is associated with the injection.  He did indicate that previous injections have been very helpful in relaxing his left shoulder, left elbow, left wrist and fingers, he did not notice significant side effect, desires further treatment.  UPDATE Sept 2nd 2015: He noticed increased specificity of his left arm, now he is quite few months out from previous injection, especially tightness of his left elbow, left shoulder, he denies significant benefit from previous injection to his left lower extremity, also worry about the side effect of causing weakness in his left hand  UPDATE Dec 2nd 2015: Last injection was in Sept 2nd 2015, works well for him, the injection focused at his left arm now.  He does not want to have injection to his left leg, he still uses his left finger to grip,  UPDATE Oct 14 2014: Last EMG guided xeomin  injection was in December second 2015, he had cholecystectomy just few weeks ago, recovering well, he wants this injection emphasized in his spastic left shoulder, elbow, wrist, he still uses his left hand to grip, supporting, does not want a week his left fingers too much  UPDATE Jun 08 2015: Last injection was March 03 2016, he tolerated the injection well, he was able to straight out his left arm better, now he complains of fast file left shoulder abduction left elbow flexion pronation, he still uses his left hand for grip and support,  Update September 06 2015: He tolerated previous injection well in January, this injection he wants to emphasize on forceful left elbow pronation flexion  Review of the system: Out of a complete 14 system review, the patient complains of only the following symptoms, and all other reviewed systems are negative.     Physical Exam  General: well nourished, well hydrated, no acute distress Head: normocephalic  Neck: supple, no masses, trachea midline Respiratory: clear to auscultation bilaterally. Cardiovascular: RRR   Neurologic Exam  Mental Status: in wheel chair, pleasant. mild slurred hoarse speeach Cranial Nerves: mild left lower face weakness. Motor: spastic left hemiparesis. Left arm stayed in left shoulder internal rotation, addcution, left forearm pronation, shoulder abduction 3, elbow flexion 3, left wrist forceful flexed position, maximum 150 degree,  left elbow fixed flexion, pronation. maximum extension 160 degree. Left leg extension at knee, left ankle fixed plantar inversion, wear left ankle boot   Coordination: normal right finger to  nose at right side Gait and Station: deferred    Assessment and Plan:   75 yo male with history of stroke, spastic left hemiparesis, return for EMG guided botulism toxin injection for his spastic left upper extremity  Under EMG guidance, He could not tolerate electrical stimulation in the past.  300 units of  xeomin was injected into his spastic left upper extremity  Left biceps 25x2=50 Left brachialis 25x 4= 100 Left pronator teres 25 Left flexor carpi ulnaris 25 units  Left pectoralis major 50  Left teres major 50  ill return to clinic in 3 months for repeat injection   Marcial Pacas, M.D. Ph.D.  [facility] [place], [place] [address] Phone: [phone] Fax:      [phone]

## 2015-09-06 NOTE — Progress Notes (Signed)
**  Xeomin 100 units x 3 vials, Lot [id number], Exp 05/2017, [place] [id number], specialty pharmacy**mck,rn

## 2015-09-14 ENCOUNTER — Telehealth: Payer: Self-pay | Admitting: Neurology

## 2015-09-14 NOTE — Telephone Encounter (Signed)
[facility] [phone] called regarding BOTOX injection.

## 2015-09-15 ENCOUNTER — Non-Acute Institutional Stay (SKILLED_NURSING_FACILITY): Payer: Medicare Other | Admitting: Internal Medicine

## 2015-09-15 ENCOUNTER — Encounter: Payer: Self-pay | Admitting: Internal Medicine

## 2015-09-15 DIAGNOSIS — K589 Irritable bowel syndrome without diarrhea: Secondary | ICD-10-CM | POA: Diagnosis not present

## 2015-09-15 DIAGNOSIS — K862 Cyst of pancreas: Secondary | ICD-10-CM | POA: Diagnosis not present

## 2015-09-15 DIAGNOSIS — R195 Other fecal abnormalities: Secondary | ICD-10-CM | POA: Diagnosis not present

## 2015-09-15 NOTE — Progress Notes (Signed)
Patient ID: Gregory Kerr, male   DOB: [birth year], 75 y.o.   MRN: [id number]    Location:  [place] Room Number: 803-A Place of Service:  SNF (31)   Bubba Camp, [facility], MD  Patient Care Team: Blanchie Serve, MD as PCP - General (Internal Medicine)  Extended Emergency Contact Information Primary Emergency Contact: [name],[name]          [place] of [place] Phone: [phone] Mobile Phone: [phone] Relation: Sister  Code Status:  Full code  Goals of care: Advanced Directive information Advanced Directives 03/23/2015  Does patient have an advance directive? Yes  Type of Advance Directive -  Does patient want to make changes to advanced directive? -  Copy of advanced directive(s) in chart? Yes  Would patient like information on creating an advanced directive? -     Chief Complaint  Patient presents with  . Medical Management of Chronic Issues    Routine Visit    Allergies  Allergen Reactions  . Penicillins Rash    HPI:  Pt is a 75 y.o. male seen today for routine visit. Has ongoing one episode of loose stool in the am. This is preceded with abdominal cramps and discomfort to LLQ followed by gas and loose stool. Denies any blood in int. Denies any nausea or vomiting. No further bowel movement after this. He has tried fiber supplement to bulk his stool without help. He has tried lactose free meal without help. No other concerns.   Review of Systems Constitutional: Negative for fever, chills, diaphoresis.   HENT: Negative for congestion, hearing loss, earache and sore throat.    Respiratory: Negative for cough, shortness of breath and wheezing.    Cardiovascular: Negative for chest pain, palpitations, leg swelling.   Gastrointestinal: Negative for heartburn, nausea, vomiting, abdominal pain. Denies melena or rectal bleed Genitourinary: Negative for dysuria Musculoskeletal: Negative for fall. Has muscle  spasticity. Gets routine botox injections from neurology Skin: Negative for itching, rash.   Neurological: Negative for weakness,dizziness Psychiatric/Behavioral: Negative for depression    Past Medical History  Diagnosis Date  . GERD (gastroesophageal reflux disease)   . Hypertension   . Hernia     "large one; on my left stomach" (09/22/2014)  . Hyperlipidemia   . HTN (hypertension) 10/24/2012  . Osteoporosis, unspecified 10/24/2012  . 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 Hiatal hernia 10/24/2012  . Pneumonia     "once or twice" (09/22/2014)  . Stroke [facility]) 2007    "left side doesn't work now" (09/22/2014)   Past Surgical History  Procedure Laterality Date  . Appendectomy  05/27/2009    Ronnald Collum, MD  . Cataract extraction w/ intraocular lens  implant, bilateral Bilateral ?2014  . Ercp N/A 09/25/2014    Procedure: ENDOSCOPIC RETROGRADE CHOLANGIOPANCREATOGRAPHY (ERCP);  Surgeon: Clarene Essex, MD;  Location: [facility] ENDOSCOPY;  Service: Endoscopy;  Laterality: N/A;  . Cholecystectomy N/A 09/26/2014    Procedure: LAPAROSCOPIC CHOLECYSTECTOMY WITH INTRAOPERATIVE CHOLANGIOGRAM;  Surgeon: Donnie Mesa, MD;  Location: [place];  Service: General;  Laterality: N/A;       Medication List       This list is accurate as of: 09/15/15  1:03 PM.  Always use your most recent med list.               baclofen 10 MG tablet  Commonly known as:  LIORESAL  Take one tablet by mouth four times daily for muscle spasm  calcium carbonate 1250 (500 Ca) MG tablet  Commonly known as:  OS-CAL - dosed in mg of elemental calcium  Take 1 tablet by mouth 2 (two) times daily with a meal. 500mg  elemental     Cholecalciferol 2000 units Tabs  Take 2,000 Units by mouth daily.     dicyclomine 20 MG tablet  Commonly known as:  BENTYL  Take 20 mg by mouth 3 (three) times daily.     diphenoxylate-atropine 2.5-0.025 MG tablet  Commonly known as:  LOMOTIL  Take 1 tablet by mouth 3 (three)  times daily as needed for diarrhea or loose stools.     dipyridamole-aspirin 200-25 MG 12hr capsule  Commonly known as:  AGGRENOX  Take one capsule by mouth twice daily for CVA     furosemide 20 MG tablet  Commonly known as:  LASIX  Take 10 mg by mouth daily.     gabapentin 100 MG capsule  Commonly known as:  NEURONTIN  Take 100 mg by mouth 3 (three) times daily.     metoprolol tartrate 25 MG tablet  Commonly known as:  LOPRESSOR  Take 0.5 tablets (12.5 mg total) by mouth 2 (two) times daily.     nitroGLYCERIN 0.4 MG SL tablet  Commonly known as:  NITROSTAT  Place 0.4 mg under the tongue every 5 (five) minutes as needed for chest pain.     NUEDEXTA 20-10 MG Caps  Generic drug:  Dextromethorphan-Quinidine  Take one capsule by mouth once daily for PBA     oxycodone 5 MG capsule  Commonly known as:  OXY-IR  Take 5 mg by mouth every 6 (six) hours as needed for pain.     PREPLUS 27-1 MG Tabs  Take 1 tablet by mouth 2 (two) times daily.     PROBIOTIC PO  Take 250 mg by mouth daily. For IBS     sertraline 25 MG tablet  Commonly known as:  ZOLOFT  Take 25 mg by mouth daily.     simvastatin 20 MG tablet  Commonly known as:  ZOCOR  Take one tablet by mouth every evening for cholesterol         Immunization History  Administered Date(s) Administered  . Influenza-Unspecified 03/25/2013, 02/19/2014, 03/03/2014, 03/03/2015  . PPD Test 01/31/2013, 02/02/2014, 01/26/2015  . Pneumococcal-Unspecified 03/30/2011   Pertinent  Health Maintenance Due  Topic Date Due  . PNA vac Low Risk Adult (2 of 2 - PCV13) 05/22/2016 (Originally 03/29/2012)  . COLONOSCOPY  05/23/2023 (Originally 05/11/1993)  . INFLUENZA VACCINE  12/21/2015    Physical exam: Filed Vitals:   09/15/15 1250  BP: 106/52  Pulse: 55  Temp: 97.9 F (36.6 C)  TempSrc: Oral  Height: 5\' 9"  (1.753 m)  Weight: 160 lb (72.576 kg)  SpO2: 97%   Body mass index is 23.62 kg/(m^2).  General- elderly male in no  acute distress Head- atraumatic, normocephalic Eyes- PERRLA, EOMI, no pallor, no icterus, no discharge Neck- no cervical lymphadenopathy Throat- moist mucus membrane Cardiovascular- normal s1,s2, no murmurs, normal dorsalis pedis Respiratory- bilateral clear to auscultation, no wheeze, no rhonchi, no crackles, no use of accessory muscles Abdomen- bowel sounds present, soft, non tender, no guarding or rigidity, no CVA tenderness Musculoskeletal- left sided hemiplegia, normal strength on right side Neurological- no focal deficit, left sided weakness post CVA Skin- warm and dry Psychiatry- alert and oriented to person, place and time   Labs reviewed:  Recent Labs  09/24/14 0611 09/26/14 0721 09/27/14 0620  11/30/14 05/20/15 08/12/15  NA 138 138 134*  < > 140 142 141  K 4.1 4.7 3.9  < > 4.1 3.9 4.3  CL 110 112* 103  --   --   --   --   CO2 19* 20* 21*  --   --   --   --   GLUCOSE 112* 77 114*  --   --   --   --   BUN 9 8 10   < > 23* 19 23*  CREATININE 0.63 0.69 0.75  < > 0.8 0.8 0.9  CALCIUM 8.5* 7.8* 8.3*  --   --   --   --   < > = values in this interval not displayed.  Recent Labs  09/24/14 0611 09/26/14 0721 09/27/14 0620 10/02/14 1055 12/21/14  AST 38 63* 78* 26 15  ALT 75* 71* 86* 32 14  ALKPHOS 365* 284* 296* 237* 86  BILITOT 1.7* 2.2* 1.6*  --   --   PROT 6.1* 5.5* 6.0*  --   --   ALBUMIN 2.6* 2.2* 2.4*  --   --     Recent Labs  09/22/14 0543 09/23/14 0605 09/26/14 0721 09/27/14 0620  11/30/14 05/20/15 07/02/15  WBC 12.1* 6.4 5.1 8.7  < > 5.5 6.0 5.8  NEUTROABS 10.7*  --  3.5  --   --   --   --   --   HGB 14.3 13.3 11.5* 11.9*  < > 13.0* 14.1 14.6  HCT 42.3 39.8 35.8* 35.9*  < > 40* 44 44  MCV 87.0 87.3 89.7 88.6  --   --   --   --   PLT 216 206 197 200  < > 200 196 174  < > = values in this interval not displayed. Lab Results  Component Value Date   TSH 2.73 07/02/2015   Lab Results  Component Value Date   HGBA1C 5.7 07/02/2015   Lab Results    Component Value Date   CHOL 157 08/12/2015   HDL 38 08/12/2015   LDLCALC 98 08/12/2015   TRIG 109 08/12/2015   CHOLHDL 7.7 04/04/2014   07/02/15 b12- 793 Ferritin 47.7  Assessment/Plan  Loose stool 1 explosive episode in am everyday associated with LLQ discomfort. No abdominal pain on exam. Currently on dicyclomine and probiotics. No signs of infection on exam. Has atrophied pancreas noted on prior imaging of abdomen. Concern of chronic pancreatitis contributing to this. 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 Repeat imaging recommended in1 year. Will have him see GI to evaluate further before ordering the repeat MRI with and without iv contrast  IBS Continue dicyclomine 20 mg tid for now. Continue probiotic. Monitor   Blanchie Serve, MD Internal Medicine [facility] [address] [place], [place] [address] Cell Phone (Monday-Friday 8 am - 5 pm): [phone] On Call: [phone] and follow prompts after 5 pm and on weekends Office Phone: [phone] Office Fax: [phone]

## 2015-09-16 NOTE — Telephone Encounter (Signed)
Returned the call and left a message asking for them to return my call.

## 2015-09-20 LAB — HM DIABETES FOOT EXAM

## 2015-10-14 ENCOUNTER — Encounter: Payer: Self-pay | Admitting: Internal Medicine

## 2015-10-14 ENCOUNTER — Non-Acute Institutional Stay (SKILLED_NURSING_FACILITY): Payer: Medicare Other | Admitting: Internal Medicine

## 2015-10-14 DIAGNOSIS — I6930 Unspecified sequelae of cerebral infarction: Secondary | ICD-10-CM | POA: Diagnosis not present

## 2015-10-14 DIAGNOSIS — J300 Vasomotor rhinitis: Secondary | ICD-10-CM | POA: Diagnosis not present

## 2015-10-14 DIAGNOSIS — F482 Pseudobulbar affect: Secondary | ICD-10-CM | POA: Diagnosis not present

## 2015-10-14 NOTE — Progress Notes (Signed)
Patient ID: Gregory Kerr, male   DOB: 1941-05-18, 74 y.o.   MRN: [id number]    Location:  [place] Room Number: 803-A Place of Service:  SNF (31)   Bubba Camp, [facility], MD  Patient Care Team: Blanchie Serve, MD as PCP - General (Internal Medicine)  Extended Emergency Contact Information Primary Emergency Contact: [name],[name]          [place] Phone: [phone] Mobile Phone: [phone] Relation: Sister  Code Status:  Full code  Goals of care: Advanced Directive information Advanced Directives 03/23/2015  Does patient have an advance directive? Yes  Type of Advance Directive -  Does patient want to make changes to advanced directive? -  Copy of advanced directive(s) in chart? Yes  Would patient like information on creating an advanced directive? -     Chief Complaint  Patient presents with  . Medical Management of Chronic Issues    Routine Visit    Allergies  Allergen Reactions  . Penicillins Rash    HPI:  Pt is a 75 y.o. male seen today for routine visit. He has an upcoming GI appointment. He has been taking fiber in his breakfast and his bowel consistency has improved. He has required imodium only occasionally now. He complaints of rhinorrhea with meals. He has been at his baseline and denies any other concerns.   Review of Systems Constitutional: Negative for fever.   HENT: Negative for congestion, hearing loss, earache and sore throat.    Respiratory: Negative for cough, shortness of breath and wheezing.    Cardiovascular: Negative for chest pain, palpitations, leg swelling.   Gastrointestinal: Negative for heartburn, nausea, vomiting, abdominal pain.  Genitourinary: Negative for dysuria Musculoskeletal: Negative for fall. Has muscle spasticity. Gets routine botox injections from neurology Skin: Negative for itching, rash.   Neurological: Negative for  weakness,dizziness Psychiatric/Behavioral: Negative for depression    Past Medical History  Diagnosis Date  . GERD (gastroesophageal reflux disease)   . Hypertension   . Hernia     "large one; on my left stomach" (09/22/2014)  . Hyperlipidemia   . HTN (hypertension) 10/24/2012  . Osteoporosis, unspecified 10/24/2012  . 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 Hiatal hernia 10/24/2012  . Pneumonia     "once or twice" (09/22/2014)  . Stroke [facility]) 2007    "left side doesn't work now" (09/22/2014)   Past Surgical History  Procedure Laterality Date  . Appendectomy  05/27/2009    Ronnald Collum, MD  . Cataract extraction w/ intraocular lens  implant, bilateral Bilateral ?2014  . Ercp N/A 09/25/2014    Procedure: ENDOSCOPIC RETROGRADE CHOLANGIOPANCREATOGRAPHY (ERCP);  Surgeon: Clarene Essex, MD;  Location: [facility] ENDOSCOPY;  Service: Endoscopy;  Laterality: N/A;  . Cholecystectomy N/A 09/26/2014    Procedure: LAPAROSCOPIC CHOLECYSTECTOMY WITH INTRAOPERATIVE CHOLANGIOGRAM;  Surgeon: Donnie Mesa, MD;  Location: [place];  Service: General;  Laterality: N/A;       Medication List       This list is accurate as of: 10/14/15 10:39 AM.  Always use your most recent med list.               baclofen 10 MG tablet  Commonly known as:  LIORESAL  Take one tablet by mouth four times daily for muscle spasm     calcium carbonate 1250 (500 Ca) MG tablet  Commonly known as:  OS-CAL - dosed in mg of elemental calcium  Take 1  tablet by mouth 2 (two) times daily with a meal. 500mg  elemental     Cholecalciferol 2000 units Tabs  Take 2,000 Units by mouth daily.     dicyclomine 20 MG tablet  Commonly known as:  BENTYL  Take 20 mg by mouth 3 (three) times daily.     diphenoxylate-atropine 2.5-0.025 MG tablet  Commonly known as:  LOMOTIL  Take 1 tablet by mouth 3 (three) times daily as needed for diarrhea or loose stools.     dipyridamole-aspirin 200-25 MG 12hr capsule  Commonly known  as:  AGGRENOX  Take one capsule by mouth twice daily for CVA     furosemide 20 MG tablet  Commonly known as:  LASIX  Take 10 mg by mouth daily.     gabapentin 100 MG capsule  Commonly known as:  NEURONTIN  Take 100 mg by mouth 3 (three) times daily.     metoprolol tartrate 25 MG tablet  Commonly known as:  LOPRESSOR  Take 0.5 tablets (12.5 mg total) by mouth 2 (two) times daily.     nitroGLYCERIN 0.4 MG SL tablet  Commonly known as:  NITROSTAT  Place 0.4 mg under the tongue every 5 (five) minutes as needed for chest pain.     NUEDEXTA 20-10 MG Caps  Generic drug:  Dextromethorphan-Quinidine  Take one capsule by mouth once daily for PBA     oxycodone 5 MG capsule  Commonly known as:  OXY-IR  Take 5 mg by mouth every 6 (six) hours as needed for pain.     PREPLUS 27-1 MG Tabs  Take 1 tablet by mouth 2 (two) times daily.     PROBIOTIC PO  Take 250 mg by mouth daily. For IBS     sertraline 25 MG tablet  Commonly known as:  ZOLOFT  Take 25 mg by mouth daily.     simvastatin 20 MG tablet  Commonly known as:  ZOCOR  Take one tablet by mouth every evening for cholesterol         Immunization History  Administered Date(s) Administered  . Influenza-Unspecified 03/25/2013, 02/19/2014, 03/03/2014, 03/03/2015  . PPD Test 01/31/2013, 02/02/2014, 01/26/2015  . Pneumococcal-Unspecified 03/30/2011   Pertinent  Health Maintenance Due  Topic Date Due  . PNA vac Low Risk Adult (2 of 2 - PCV13) 05/22/2016 (Originally 03/29/2012)  . COLONOSCOPY  05/23/2023 (Originally 05/11/1993)  . INFLUENZA VACCINE  12/21/2015    Physical exam: Filed Vitals:   10/14/15 1032  BP: 105/65  Pulse: 59  Resp: 18  Height: 5\' 9"  (1.753 m)  Weight: 162 lb 9.6 oz (73.755 kg)  SpO2: 95%   Body mass index is 24 kg/(m^2).  General- elderly male in no acute distress Head- atraumatic, normocephalic Eyes- PERRLA, EOMI, no pallor, no icterus, no discharge Nose- pink but swollen nasal mucosa, no  bleed, no visible discharge at present Neck- no cervical lymphadenopathy Throat- moist mucus membrane Cardiovascular- normal s1,s2, no murmurs, normal dorsalis pedis Respiratory- bilateral clear to auscultation, no wheeze, no rhonchi, no crackles, no use of accessory muscles Abdomen- bowel sounds present, soft, non tender, no guarding or rigidity, no CVA tenderness Musculoskeletal- left sided hemiplegia, normal strength on right side Neurological- no focal deficit, left sided weakness post CVA Skin- warm and dry Psychiatry- alert and oriented to person, place and time   Labs reviewed:  Recent Labs  11/30/14 05/20/15 08/12/15  NA 140 142 141  K 4.1 3.9 4.3  BUN 23* 19 23*  CREATININE 0.8 0.8 0.9  Recent Labs  12/21/14  AST 15  ALT 14  ALKPHOS 86    Recent Labs  11/30/14 05/20/15 07/02/15  WBC 5.5 6.0 5.8  HGB 13.0* 14.1 14.6  HCT 40* 44 44  PLT 200 196 174   Lab Results  Component Value Date   TSH 2.73 07/02/2015   Lab Results  Component Value Date   HGBA1C 5.7 07/02/2015   Lab Results  Component Value Date   CHOL 157 08/12/2015   HDL 38 08/12/2015   LDLCALC 98 08/12/2015   TRIG 109 08/12/2015   CHOLHDL 7.7 04/04/2014   07/02/15 b12- 793 Ferritin 47.7  Assessment/Plan  Vasomotor rhinitis Start azelastine nasal spray 2 spray to each nostril bid x 1 week and reassess  PBA Has been stable, continue nuedexta and monitor  CVA with residual effect Continue aggrenox and statin with baclofen for spasticity   Blanchie Serve, MD Internal Medicine [facility] [place], [place] [address] Cell Phone (Monday-Friday 8 am - 5 pm): [phone] On Call: [phone] and follow prompts after 5 pm and on weekends Office Phone: [phone] Office Fax: [phone]

## 2015-11-12 ENCOUNTER — Encounter: Payer: Self-pay | Admitting: Internal Medicine

## 2015-11-12 ENCOUNTER — Non-Acute Institutional Stay (SKILLED_NURSING_FACILITY): Payer: Medicare Other | Admitting: Internal Medicine

## 2015-11-12 DIAGNOSIS — K589 Irritable bowel syndrome without diarrhea: Secondary | ICD-10-CM | POA: Diagnosis not present

## 2015-11-12 DIAGNOSIS — L989 Disorder of the skin and subcutaneous tissue, unspecified: Secondary | ICD-10-CM

## 2015-11-12 DIAGNOSIS — E785 Hyperlipidemia, unspecified: Secondary | ICD-10-CM | POA: Diagnosis not present

## 2015-11-12 DIAGNOSIS — I25119 Atherosclerotic heart disease of native coronary artery with unspecified angina pectoris: Secondary | ICD-10-CM

## 2015-11-12 NOTE — Progress Notes (Signed)
Patient ID: Gregory Kerr, male   DOB: [birth year], 75 y.o.   MRN: [id number]    [facility] Room Number: [id number] Place of Service:  SNF (31)   Blanchie Serve, MD  Patient Care Team: Blanchie Serve, MD as PCP - General (Internal Medicine)  Extended Emergency Contact Information Primary Emergency Contact: [name],[name]          [place] Phone: [phone] Mobile Phone: [phone] Relation: Sister  Code Status:  Full code  Goals of care: Advanced Directive information Advanced Directives 03/23/2015  Does patient have an advance directive? Yes  Type of Advance Directive -  Does patient want to make changes to advanced directive? -  Copy of advanced directive(s) in chart? Yes  Would patient like information on creating an advanced directive? -     Chief Complaint  Patient presents with  . Medical Management of Chronic Issues    Routine Visit    Allergies  Allergen Reactions  . Penicillins Rash    HPI:  Pt is a 75 y.o. male seen today for routine visit. He continues to have loose stool 1 episode in the morning and would like to see GI for further management. He was asked to increase his fiber intake and this has not helped. Denies abdominal pain or melena or blood in stool. Denies nausea or vomiting. Denies abdominal cramp with defecation. He also has a lesion to his back that staff have noticed now for few weeks which has not healed.    Review of Systems Constitutional: Negative for fever.   HENT: Negative for congestion Respiratory: Negative for cough, shortness of breath and wheezing.    Cardiovascular: Negative for chest pain, palpitations, leg swelling.   Gastrointestinal: Negative for heartburn, nausea, vomiting, abdominal pain.  Genitourinary: Negative for dysuria Musculoskeletal: Negative for fall. Has muscle spasticity.  Skin: Negative for itching, rash.   Neurological: Negative for  weakness,dizziness Psychiatric/Behavioral: Negative for depression    Past Medical History  Diagnosis Date  . GERD (gastroesophageal reflux disease)   . Hypertension   . Hernia     "large one; on my left stomach" (09/22/2014)  . Hyperlipidemia   . HTN (hypertension) 10/24/2012  . Osteoporosis, unspecified 10/24/2012  . 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 Hiatal hernia 10/24/2012  . Pneumonia     "once or twice" (09/22/2014)  . Stroke [facility]) 2007    "left side doesn't work now" (09/22/2014)   Past Surgical History  Procedure Laterality Date  . Appendectomy  05/27/2009    Ronnald Collum, MD  . Cataract extraction w/ intraocular lens  implant, bilateral Bilateral ?2014  . Ercp N/A 09/25/2014    Procedure: ENDOSCOPIC RETROGRADE CHOLANGIOPANCREATOGRAPHY (ERCP);  Surgeon: Clarene Essex, MD;  Location: [facility] ENDOSCOPY;  Service: Endoscopy;  Laterality: N/A;  . Cholecystectomy N/A 09/26/2014    Procedure: LAPAROSCOPIC CHOLECYSTECTOMY WITH INTRAOPERATIVE CHOLANGIOGRAM;  Surgeon: Donnie Mesa, MD;  Location: [place];  Service: General;  Laterality: N/A;       Medication List       This list is accurate as of: 11/12/15 12:45 PM.  Always use your most recent med list.               azelastine 0.1 % nasal spray  Commonly known as:  ASTELIN  Place 1 spray into both nostrils 2 (two) times daily. Use in each nostril as directed     baclofen 10 MG tablet  Commonly  known as:  LIORESAL  Take one tablet by mouth four times daily for muscle spasm     calcium carbonate 1250 (500 Ca) MG tablet  Commonly known as:  OS-CAL - dosed in mg of elemental calcium  Take 1 tablet by mouth 2 (two) times daily with a meal. 500mg  elemental     Cholecalciferol 2000 units Tabs  Take 2,000 Units by mouth daily.     dicyclomine 20 MG tablet  Commonly known as:  BENTYL  Take 20 mg by mouth 3 (three) times daily.     diphenoxylate-atropine 2.5-0.025 MG tablet  Commonly known as:  LOMOTIL   Take 1 tablet by mouth 3 (three) times daily as needed for diarrhea or loose stools.     dipyridamole-aspirin 200-25 MG 12hr capsule  Commonly known as:  AGGRENOX  Take one capsule by mouth twice daily for CVA     furosemide 20 MG tablet  Commonly known as:  LASIX  Take 10 mg by mouth daily.     gabapentin 100 MG capsule  Commonly known as:  NEURONTIN  Take 100 mg by mouth 3 (three) times daily.     metoprolol tartrate 25 MG tablet  Commonly known as:  LOPRESSOR  Take 0.5 tablets (12.5 mg total) by mouth 2 (two) times daily.     nitroGLYCERIN 0.4 MG SL tablet  Commonly known as:  NITROSTAT  Place 0.4 mg under the tongue every 5 (five) minutes as needed for chest pain.     NUEDEXTA 20-10 MG Caps  Generic drug:  Dextromethorphan-Quinidine  Take one capsule by mouth once daily for PBA     oxycodone 5 MG capsule  Commonly known as:  OXY-IR  Take 5 mg by mouth every 6 (six) hours as needed for pain.     PREPLUS 27-1 MG Tabs  Take 1 tablet by mouth 2 (two) times daily.     PROBIOTIC PO  Take 250 mg by mouth daily. For IBS     sertraline 25 MG tablet  Commonly known as:  ZOLOFT  Take 25 mg by mouth daily.     simvastatin 20 MG tablet  Commonly known as:  ZOCOR  Take one tablet by mouth every evening for cholesterol         Immunization History  Administered Date(s) Administered  . Influenza-Unspecified 03/25/2013, 02/19/2014, 03/03/2014, 03/03/2015  . PPD Test 01/31/2013, 02/02/2014, 01/26/2015  . Pneumococcal-Unspecified 03/30/2011   Pertinent  Health Maintenance Due  Topic Date Due  . PNA vac Low Risk Adult (2 of 2 - PCV13) 05/22/2016 (Originally 03/29/2012)  . COLONOSCOPY  05/23/2023 (Originally 05/11/1993)  . INFLUENZA VACCINE  12/21/2015    Physical exam: Filed Vitals:   11/12/15 1238  BP: 132/55  Pulse: 82  Temp: 98.8 F (37.1 C)  TempSrc: Oral  Resp: 20  Height: 5\' 9"  (1.753 m)  Weight: 159 lb 3.2 oz (72.213 kg)  SpO2: 96%   Body mass index  is 23.5 kg/(m^2).  General- elderly male in no acute distress Head- atraumatic, normocephalic Eyes- PERRLA, EOMI, no pallor, no icterus, no discharge Nose- no nasal discharge Neck- no cervical lymphadenopathy Throat- moist mucus membrane Cardiovascular- normal s1,s2, no murmurs, normal dorsalis pedis Respiratory- bilateral clear to auscultation, no wheeze, no rhonchi, no crackles, no use of accessory muscles Abdomen- bowel sounds present, soft, non tender, no guarding or rigidity, no CVA tenderness Musculoskeletal- left sided hemiplegia, normal strength on right side Neurological- no focal deficit, left sided weakness post CVA Skin- warm  and dry, 0.5 x 0.5 cm open lesion to left mid back area with mild erythema, no drainage, non tender. Has raised skin lesion to right scalp area. Psychiatry- alert and oriented to person, place and time   Labs reviewed:  Recent Labs  11/30/14 05/20/15 08/12/15  NA 140 142 141  K 4.1 3.9 4.3  BUN 23* 19 23*  CREATININE 0.8 0.8 0.9    Recent Labs  12/21/14  AST 15  ALT 14  ALKPHOS 86    Recent Labs  11/30/14 05/20/15 07/02/15  WBC 5.5 6.0 5.8  HGB 13.0* 14.1 14.6  HCT 40* 44 44  PLT 200 196 174   Lab Results  Component Value Date   TSH 2.73 07/02/2015   Lab Results  Component Value Date   HGBA1C 5.7 07/02/2015   Lab Results  Component Value Date   CHOL 157 08/12/2015   HDL 38 08/12/2015   LDLCALC 98 08/12/2015   TRIG 109 08/12/2015   CHOLHDL 7.7 04/04/2014   07/02/15 b12- 793 Ferritin 47.7  Assessment/Plan  Skin lesion on back Non haling open skin lesion to the back. Get will clean the area and cover with band aid for now. no signs of infection. Get dermatology consult  Skin lesion to scalp No signs of infection, has irregular border, get dermatology referral for possible skin biopsy for further evaluation  IBS With one episode of loose bowel movement a day. Currently on probiotic, lomotil, bentyl and fiber  supplement. Patient requests to follow up with his GI for further evaluation. Likely has this from his autonomic dysfunction  HLD LDL at goal with zocor, continue this  CAD Chest pain free, continue lopressor and statin with prn NTG and aggrenox    Blanchie Serve, MD Internal Medicine [facility] [place], [place] [address] Cell Phone (Monday-Friday 8 am - 5 pm): [phone] On Call: [phone] and follow prompts after 5 pm and on weekends Office Phone: [phone] Office Fax: [phone]

## 2015-11-19 LAB — LIPID PANEL
Cholesterol: 153 mg/dL (ref 0–200)
HDL: 36 mg/dL (ref 35–70)
LDL CALC: 96 mg/dL
TRIGLYCERIDES: 104 mg/dL (ref 40–160)

## 2015-11-29 ENCOUNTER — Telehealth: Payer: Self-pay | Admitting: Neurology

## 2015-11-29 NOTE — Telephone Encounter (Signed)
[name]/Sister called to cancel July 17th [facility] appointment.

## 2015-12-06 ENCOUNTER — Ambulatory Visit: Payer: Medicare Other | Admitting: Neurology

## 2015-12-13 ENCOUNTER — Non-Acute Institutional Stay (SKILLED_NURSING_FACILITY): Payer: Medicare Other | Admitting: Internal Medicine

## 2015-12-13 ENCOUNTER — Encounter: Payer: Self-pay | Admitting: Internal Medicine

## 2015-12-13 DIAGNOSIS — K449 Diaphragmatic hernia without obstruction or gangrene: Secondary | ICD-10-CM | POA: Diagnosis not present

## 2015-12-13 DIAGNOSIS — I5032 Chronic diastolic (congestive) heart failure: Secondary | ICD-10-CM | POA: Diagnosis not present

## 2015-12-13 DIAGNOSIS — I10 Essential (primary) hypertension: Secondary | ICD-10-CM | POA: Diagnosis not present

## 2015-12-13 DIAGNOSIS — K580 Irritable bowel syndrome with diarrhea: Secondary | ICD-10-CM

## 2015-12-13 NOTE — Progress Notes (Signed)
Patient ID: Gregory Kerr, male   DOB: 23-Feb-1941, 75 y.o.   MRN: [id number]    Location:    [facility] Room Number: [id number] Place of Service:  SNF (31)   Blanchie Serve, MD  Patient Care Team: Blanchie Serve, MD as PCP - General (Internal Medicine)  Extended Emergency Contact Information Primary Emergency Contact: [name],[name]          [place] of [place] Phone: [phone] Mobile Phone: [phone] Relation: Sister  Code Status:  Full code  Goals of care: Advanced Directive information Advanced Directives 03/23/2015  Does patient have an advance directive? Yes  Type of Advance Directive -  Does patient want to make changes to advanced directive? -  Copy of advanced directive(s) in chart? Yes  Would patient like information on creating an advanced directive? -     Chief Complaint  Patient presents with  . Medical Management of Chronic Issues    Routine Visit    Allergies  Allergen Reactions  . Penicillins Rash    HPI:  Pt is a 75 y.o. male seen today for routine visit. He is currently on imodium and miralax as needed for diarrhea and constipation and is now pending gi appointment for colonoscopy. He had his skin lesions biopsied with result pending. He denies any concern this visit.   Review of Systems Constitutional: Negative for fever.   HENT: Negative for congestion Respiratory: Negative for cough, shortness of breath and wheezing.    Cardiovascular: Negative for chest pain, palpitations, leg swelling.   Gastrointestinal: Negative for heartburn, nausea, vomiting, abdominal pain.  Genitourinary: Negative for dysuria Musculoskeletal: Negative for fall. Has muscle spasticity.  Skin: Negative for itching, rash.   Neurological: Negative for weakness,dizziness Psychiatric/Behavioral: Negative for depression    Past Medical History:  Diagnosis Date  . GERD (gastroesophageal reflux disease)   . Hernia    "large one; on my left  stomach" (09/22/2014)  . Hiatal hernia 10/24/2012  . HTN (hypertension) 10/24/2012  . Hyperlipidemia   . Hypertension   . 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 Osteoporosis, unspecified 10/24/2012  . 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 Pneumonia    "once or twice" (09/22/2014)  . Stroke [facility]) 2007   "left side doesn't work now" (09/22/2014)   Past Surgical History:  Procedure Laterality Date  . APPENDECTOMY  05/27/2009   Ronnald Collum, MD  . CATARACT EXTRACTION W/ INTRAOCULAR LENS  IMPLANT, BILATERAL Bilateral ?2014  . CHOLECYSTECTOMY N/A 09/26/2014   Procedure: LAPAROSCOPIC CHOLECYSTECTOMY WITH INTRAOPERATIVE CHOLANGIOGRAM;  Surgeon: Donnie Mesa, MD;  Location: [place];  Service: General;  Laterality: N/A;  . ERCP N/A 09/25/2014   Procedure: ENDOSCOPIC RETROGRADE CHOLANGIOPANCREATOGRAPHY (ERCP);  Surgeon: Clarene Essex, MD;  Location: [facility] ENDOSCOPY;  Service: Endoscopy;  Laterality: N/A;       Medication List       Accurate as of 12/13/15  3:30 PM. Always use your most recent med list.          azelastine 0.1 % nasal spray Commonly known as:  ASTELIN Place 1 spray into both nostrils 2 (two) times daily. Use in each nostril as directed   baclofen 10 MG tablet Commonly known as:  LIORESAL Take one tablet by mouth four times daily for muscle spasm   calcium carbonate 1250 (500 Ca) MG tablet Commonly known as:  OS-CAL - dosed in mg of elemental calcium Take 1 tablet by mouth 2 (two) times daily with a meal. 500mg  elemental   Cholecalciferol 2000 units  Tabs Take 2,000 Units by mouth daily.   dicyclomine 20 MG tablet Commonly known as:  BENTYL Take 20 mg by mouth 3 (three) times daily.   diphenoxylate-atropine 2.5-0.025 MG tablet Commonly known as:  LOMOTIL Take 1 tablet by mouth every 8 (eight) hours as needed for diarrhea or loose stools.   dipyridamole-aspirin 200-25 MG 12hr capsule Commonly known as:  AGGRENOX Take one capsule by mouth twice daily for CVA   furosemide 20 MG  tablet Commonly known as:  LASIX Take 10 mg by mouth daily.   gabapentin 100 MG capsule Commonly known as:  NEURONTIN Take 100 mg by mouth 3 (three) times daily.   loperamide 2 MG capsule Commonly known as:  IMODIUM Take 2 mg by mouth as needed for diarrhea or loose stools.   metoprolol tartrate 25 MG tablet Commonly known as:  LOPRESSOR Take 0.5 tablets (12.5 mg total) by mouth 2 (two) times daily.   nitroGLYCERIN 0.4 MG SL tablet Commonly known as:  NITROSTAT Place 0.4 mg under the tongue every 5 (five) minutes as needed for chest pain.   NUEDEXTA 20-10 MG Caps Generic drug:  Dextromethorphan-Quinidine Take one capsule by mouth once daily for PBA   oxycodone 5 MG capsule Commonly known as:  OXY-IR Take 5 mg by mouth every 6 (six) hours as needed for pain.   polyethylene glycol packet Commonly known as:  MIRALAX / GLYCOLAX Take 17 g by mouth as needed.   PREPLUS 27-1 MG Tabs Take 1 tablet by mouth 2 (two) times daily.   saccharomyces boulardii 250 MG capsule Commonly known as:  FLORASTOR Take 250 mg by mouth 2 (two) times daily.   sertraline 25 MG tablet Commonly known as:  ZOLOFT Take 25 mg by mouth daily.   simvastatin 10 MG tablet Commonly known as:  ZOCOR Take 10 mg by mouth daily.        Immunization History  Administered Date(s) Administered  . Influenza-Unspecified 03/25/2013, 02/19/2014, 03/03/2014, 03/03/2015  . PPD Test 01/31/2013, 02/02/2014, 01/26/2015  . Pneumococcal-Unspecified 03/30/2011   Pertinent  Health Maintenance Due  Topic Date Due  . PNA vac Low Risk Adult (2 of 2 - PCV13) 05/22/2016 (Originally 03/29/2012)  . COLONOSCOPY  05/23/2023 (Originally 05/11/1993)  . INFLUENZA VACCINE  12/21/2015    Physical exam: Vitals:   12/13/15 1510  BP: (!) 132/55  Pulse: 82  Resp: 20  Temp: 98.8 F (37.1 C)  TempSrc: Oral  SpO2: 96%  Weight: 156 lb 12.8 oz (71.1 kg)  Height: 5\' 9"  (1.753 m)   Body mass index is 23.16  kg/m.  General- elderly male in no acute distress Head- atraumatic, normocephalic Eyes- PERRLA, EOMI, no pallor, no icterus, no discharge Nose- no nasal discharge Neck- no cervical lymphadenopathy Throat- moist mucus membrane Cardiovascular- normal s1,s2, no murmur Respiratory- bilateral clear to auscultation, no wheeze, no rhonchi, no crackles, no use of accessory muscles Abdomen- bowel sounds present, soft, non tender Musculoskeletal- left sided hemiplegia, normal strength on right side Neurological- no focal deficit, left sided weakness post CVA Skin- warm and dry Psychiatry- alert and oriented to person, place and time   Labs reviewed:  Recent Labs  05/20/15 08/12/15  NA 142 141  K 3.9 4.3  BUN 19 23*  CREATININE 0.8 0.9    Recent Labs  12/21/14  AST 15  ALT 14  ALKPHOS 86    Recent Labs  05/20/15 07/02/15  WBC 6.0 5.8  HGB 14.1 14.6  HCT 44 44  PLT 196  174   Lab Results  Component Value Date   TSH 2.73 07/02/2015   Lab Results  Component Value Date   HGBA1C 5.7 07/02/2015   Lab Results  Component Value Date   CHOL 153 11/19/2015   HDL 36 11/19/2015   LDLCALC 96 11/19/2015   TRIG 104 11/19/2015   CHOLHDL 7.7 04/04/2014   07/02/15 b12- 793 Ferritin 47.7  Assessment/Plan  HTN Stable, continue lopressor 12.5 mg bid and monitor bp readings.   Chronic diastolic chf Continue lasix 10 mg daily, lopressor 12.5 mg bid. Monitor bmp  Hiatal hernia Stable, off all medications  IBS With one episode of loose bowel movement a day. Currently on probiotic, imodium and fiber supplement along with prn miralax. Pending colonoscopy.    Blanchie Serve, MD Internal Medicine [facility] [address] [place], [place] [address] Cell Phone (Monday-Friday 8 am - 5 pm): [phone] On Call: [phone] and follow prompts after 5 pm and on weekends Office Phone: [phone] Office Fax: [phone]

## 2015-12-28 NOTE — Progress Notes (Signed)
I spoke with Mr Wiland nurse Pryor Montes at [facility] for pre- op call and I faxed instructions to her for arrival time and medications to take the morning of the procedure, I spoke with Ms Grandville Silos after she received the fax and she had no questions. faxe

## 2015-12-28 NOTE — Pre-Procedure Instructions (Addendum)
    Gregory Kerr  12/28/2015      Your procedure is scheduled on Wednesday, August 9.  Report to [facility] Admitting at 12:30 AM      Call this number if you have problems the morning of surgery:[phone]   Remember:  Do not eat food or drink liquids after midnight. (unless Dr Michail Sermon gave orders with a different time)  Take these medicines the morning of surgery with A SIP OF WATER: gabapentin (NEURONTIN), metoprolol tartrate (LOPRESSOR), sertraline (ZOLOFT), dicyclomine (BENTYL)                Take if needed:nitroGLYCERIN (NITROSTAT), baclofen (LIORESAL). If needed and he tolerates it on an empty stomach may take:oxycodone (OXY-IR)    Do not wear jewelry, make-up or nail polish.  Do not wear lotions, powders, or perfumes.   Men may shave face and neck.  Do not bring valuables to the hospital.  [facility] is not responsible for any belongings or valuables.  Contacts, dentures or bridgework may not be worn into surgery.  Leave your suitcase in the car.  After surgery it may be brought to your room.

## 2015-12-29 ENCOUNTER — Ambulatory Visit (HOSPITAL_COMMUNITY)
Admission: RE | Admit: 2015-12-29 | Discharge: 2015-12-29 | Disposition: A | Discharge status: Home / Self Care | Payer: Medicare Other | Source: Ambulatory Visit | Attending: Gastroenterology | Admitting: Gastroenterology

## 2015-12-29 ENCOUNTER — Encounter (HOSPITAL_COMMUNITY): Payer: Self-pay | Admitting: Certified Registered Nurse Anesthetist

## 2015-12-29 ENCOUNTER — Encounter (HOSPITAL_COMMUNITY)
Admission: RE | Disposition: A | Discharge status: Home / Self Care | Payer: Self-pay | Source: Ambulatory Visit | Attending: Gastroenterology

## 2015-12-29 ENCOUNTER — Ambulatory Visit (HOSPITAL_COMMUNITY): Payer: Medicare Other | Admitting: Certified Registered Nurse Anesthetist

## 2015-12-29 DIAGNOSIS — Z87891 Personal history of nicotine dependence: Secondary | ICD-10-CM | POA: Insufficient documentation

## 2015-12-29 DIAGNOSIS — K640 First degree hemorrhoids: Secondary | ICD-10-CM | POA: Diagnosis not present

## 2015-12-29 DIAGNOSIS — Z79899 Other long term (current) drug therapy: Secondary | ICD-10-CM | POA: Insufficient documentation

## 2015-12-29 DIAGNOSIS — Z7982 Long term (current) use of aspirin: Secondary | ICD-10-CM | POA: Insufficient documentation

## 2015-12-29 DIAGNOSIS — Z7902 Long term (current) use of antithrombotics/antiplatelets: Secondary | ICD-10-CM | POA: Diagnosis not present

## 2015-12-29 DIAGNOSIS — I10 Essential (primary) hypertension: Secondary | ICD-10-CM | POA: Insufficient documentation

## 2015-12-29 DIAGNOSIS — R197 Diarrhea, unspecified: Secondary | ICD-10-CM | POA: Diagnosis present

## 2015-12-29 HISTORY — PX: COLONOSCOPY WITH PROPOFOL: SHX5780

## 2015-12-29 SURGERY — COLONOSCOPY WITH PROPOFOL
Anesthesia: Monitor Anesthesia Care

## 2015-12-29 MED ORDER — LACTATED RINGERS IV SOLN
INTRAVENOUS | Status: DC
  Administered 2015-12-29: 14:00:00 via INTRAVENOUS

## 2015-12-29 MED ORDER — PROPOFOL 500 MG/50ML IV EMUL
INTRAVENOUS | Status: DC | PRN
  Administered 2015-12-29: 100 ug/kg/min via INTRAVENOUS

## 2015-12-29 MED ORDER — SODIUM CHLORIDE 0.9 % IV SOLN: INTRAVENOUS | Status: DC

## 2015-12-29 MED ORDER — LIDOCAINE 2% (20 MG/ML) 5 ML SYRINGE
INTRAMUSCULAR | Status: DC | PRN
  Administered 2015-12-29: 80 mg via INTRAVENOUS

## 2015-12-29 MED ORDER — GLYCOPYRROLATE 0.2 MG/ML IJ SOLN
INTRAMUSCULAR | Status: DC | PRN
  Administered 2015-12-29: 0.2 mg via INTRAVENOUS

## 2015-12-29 NOTE — Anesthesia Postprocedure Evaluation (Signed)
Anesthesia Post Note  Patient: Gregory Kerr  Procedure(s) Performed: Procedure(s) (LRB): COLONOSCOPY WITH PROPOFOL (N/A)  Patient location during evaluation: PACU Anesthesia Type: MAC Level of consciousness: awake and alert Pain management: pain level controlled Vital Signs Assessment: post-procedure vital signs reviewed and stable Respiratory status: spontaneous breathing, nonlabored ventilation, respiratory function stable and patient connected to nasal cannula oxygen Cardiovascular status: stable and blood pressure returned to baseline Anesthetic complications: no    Last Vitals:  Vitals:   12/29/15 1442 12/29/15 1450  BP: (!) 96/57 100/61  Pulse:  67  Resp: 17 14  Temp: 36.7 C     Last Pain:  Vitals:   12/29/15 1442  TempSrc: Oral                 Alexya Mcdaris S

## 2015-12-29 NOTE — H&P (Signed)
  Date of Initial H&P: 12/16/15  History reviewed, patient examined, no change in status, stable for surgery.

## 2015-12-29 NOTE — Transfer of Care (Signed)
Immediate Anesthesia Transfer of Care Note  Patient: Gregory Kerr  Procedure(s) Performed: Procedure(s): COLONOSCOPY WITH PROPOFOL (N/A)  Patient Location: PACU and Endoscopy Unit  Anesthesia Type:MAC  Level of Consciousness: awake, lethargic and responds to stimulation  Airway & Oxygen Therapy: Patient Spontanous Breathing and Patient connected to nasal cannula oxygen  Post-op Assessment: Report given to RN and Post -op Vital signs reviewed and stable  Post vital signs: Reviewed and stable  Last Vitals:  Vitals:   12/29/15 1302  BP: (!) 141/63  Pulse: 62  Resp: 15  Temp: 36.6 C    Last Pain:  Vitals:   12/29/15 1302  TempSrc: Oral         Complications: No apparent anesthesia complications

## 2015-12-29 NOTE — Anesthesia Preprocedure Evaluation (Addendum)
Anesthesia Evaluation  Patient identified by MRN, date of birth, ID band Patient awake    Reviewed: Allergy & Precautions, NPO status , Patient's Chart, lab work & pertinent test results  Airway Mallampati: II  TM Distance: >3 FB Neck ROM: Full    Dental no notable dental hx. (+) Dental Advisory Given   Pulmonary neg pulmonary ROS, former smoker,    Pulmonary exam normal breath sounds clear to auscultation       Cardiovascular hypertension, Normal cardiovascular exam Rhythm:Regular Rate:Normal     Neuro/Psych CVA negative psych ROS   GI/Hepatic Neg liver ROS, GERD  ,  Endo/Other  negative endocrine ROS  Renal/GU negative Renal ROS  negative genitourinary   Musculoskeletal negative musculoskeletal ROS (+)   Abdominal   Peds negative pediatric ROS (+)  Hematology negative hematology ROS (+)   Anesthesia Other Findings   Reproductive/Obstetrics negative OB ROS                            Anesthesia Physical Anesthesia Plan  ASA: III  Anesthesia Plan: MAC   Post-op Pain Management:    Induction: Intravenous  Airway Management Planned: Simple Face Mask  Additional Equipment:   Intra-op Plan:   Post-operative Plan:   Informed Consent: I have reviewed the patients History and Physical, chart, labs and discussed the procedure including the risks, benefits and alternatives for the proposed anesthesia with the patient or authorized representative who has indicated his/her understanding and acceptance.   Dental advisory given  Plan Discussed with: CRNA and Surgeon  Anesthesia Plan Comments:         Anesthesia Quick Evaluation

## 2015-12-29 NOTE — Op Note (Signed)
[facility] Patient Name: Gregory Kerr Procedure Date : 12/29/2015 MRN: [id number] Attending MD: Lear Ng , MD Date of Birth: Jul 03, 1940 CSN: [id number] Age: 75 Admit Type: Inpatient Procedure:                Colonoscopy Indications:              This is the patient's first colonoscopy, Diarrhea Providers:                Lear Ng, MD, Cleda Daub, RN,                            William Dalton, Technician Referring MD:              Medicines:                Propofol per Anesthesia, Monitored Anesthesia Care Complications:            No immediate complications. Estimated Blood Loss:     Estimated blood loss was minimal. Procedure:                Pre-Anesthesia Assessment:                           - Prior to the procedure, a History and Physical                            was performed, and patient medications and                            allergies were reviewed. The patient's tolerance of                            previous anesthesia was also reviewed. The risks                            and benefits of the procedure and the sedation                            options and risks were discussed with the patient.                            All questions were answered, and informed consent                            was obtained. Prior Anticoagulants: The patient has                            taken no previous anticoagulant or antiplatelet                            agents. ASA Grade Assessment: III - A patient with                            severe systemic disease. After reviewing the risks  and benefits, the patient was deemed in                            satisfactory condition to undergo the procedure.                           After obtaining informed consent, the colonoscope                            was passed under direct vision. Throughout the                            procedure, the patient's blood pressure,  pulse, and                            oxygen saturations were monitored continuously. The                            [id number]) scope was introduced through                            the anus and advanced to the the cecum, identified                            by appendiceal orifice and ileocecal valve. The                            colonoscopy was somewhat difficult due to a                            tortuous colon and fair prep. Successful completion                            of the procedure was aided by straightening and                            shortening the scope to obtain bowel loop reduction                            and lavage. The patient tolerated the procedure                            well. The quality of the bowel preparation was good                            except the rectum was fair. The ileocecal valve,                            appendiceal orifice, and rectum were photographed. Findings:      The perianal and digital rectal examinations were normal.      The colon (entire examined portion) appeared normal. Biopsies for       histology were taken with a cold forceps from the entire colon for  evaluation of microscopic colitis. Estimated blood loss was minimal.      Internal hemorrhoids were found during retroflexion. The hemorrhoids       were small and Grade I (internal hemorrhoids that do not prolapse). Impression:               - The entire examined colon is normal. Biopsied.                           - Internal hemorrhoids. Moderate Sedation:      N/A - MAC procedure Recommendation:           - Await pathology results.                           - Repeat colonoscopy for surveillance based on                            pathology results.                           - Patient has a contact number available for                            emergencies. The signs and symptoms of potential                            delayed complications were  discussed with the                            patient. Return to normal activities tomorrow.                            Written discharge instructions were provided to the                            patient.                           - High fiber diet.                           - Post procedure medication orders were given. Procedure Code(s):        --- Professional ---                           [phone], Colonoscopy, flexible; with biopsy, single                            or multiple Diagnosis Code(s):        --- Professional ---                           R19.7, Diarrhea, unspecified                           K64.0, First degree hemorrhoids CPT copyright 2016 American Medical Association. All rights reserved. The codes documented in this report are preliminary  and upon coder review may  be revised to meet current compliance requirements. Lear Ng, MD 12/29/2015 2:39:04 PM This report has been signed electronically. Number of Addenda: 0

## 2015-12-29 NOTE — Anesthesia Procedure Notes (Signed)
Procedure Name: MAC Date/Time: 12/29/2015 2:10 PM Performed by: Willeen Cass P Pre-anesthesia Checklist: Patient identified, Emergency Drugs available, Suction available, Patient being monitored and Timeout performed Patient Re-evaluated:Patient Re-evaluated prior to inductionOxygen Delivery Method: Nasal cannula Intubation Type: IV induction Placement Confirmation: positive ETCO2 and breath sounds checked- equal and bilateral Dental Injury: Teeth and Oropharynx as per pre-operative assessment

## 2015-12-29 NOTE — Discharge Instructions (Addendum)
Will call you when the pathology results are complete.   Colonoscopy, Care After These instructions give you information on caring for yourself after your procedure. Your doctor may also give you more specific instructions. Call your doctor if you have any problems or questions after your procedure. HOME CARE  Do not drive for 24 hours.  Do not sign important papers or use machinery for 24 hours.  You may shower.  You may go back to your usual activities, but go slower for the first 24 hours.  Take rest breaks often during the first 24 hours.  Walk around or use warm packs on your belly (abdomen) if you have belly cramping or gas.  Drink enough fluids to keep your pee (urine) clear or pale yellow.  Resume your normal diet. Avoid heavy or fried foods.  Avoid drinking alcohol for 24 hours or as told by your doctor.  Only take medicines as told by your doctor. If a tissue sample (biopsy) was taken during the procedure:   Do not take aspirin or blood thinners for 7 days, or as told by your doctor.  Do not drink alcohol for 7 days, or as told by your doctor.  Eat soft foods for the first 24 hours. GET HELP IF: You still have a small amount of blood in your poop (stool) 2-3 days after the procedure. GET HELP RIGHT AWAY IF:  You have more than a small amount of blood in your poop.  You see clumps of tissue (blood clots) in your poop.  Your belly is puffy (swollen).  You feel sick to your stomach (nauseous) or throw up (vomit).  You have a fever.  You have belly pain that gets worse and medicine does not help. MAKE SURE YOU:  Understand these instructions.  Will watch your condition.  Will get help right away if you are not doing well or get worse.   This information is not intended to replace advice given to you by your health care provider. Make sure you discuss any questions you have with your health care provider.   Document Released: 06/10/2010 Document Revised:  05/13/2013 Document Reviewed: 01/13/2013 Elsevier Interactive Patient Education [employer].

## 2015-12-29 NOTE — Interval H&P Note (Signed)
History and Physical Interval Note:  12/29/2015 1:06 PM  Gregory Kerr  has presented today for surgery, with the diagnosis of diarrhea  The various methods of treatment have been discussed with the patient and family. After consideration of risks, benefits and other options for treatment, the patient has consented to  Procedure(s): COLONOSCOPY WITH PROPOFOL (N/A) as a surgical intervention .  The patient's history has been reviewed, patient examined, no change in status, stable for surgery.  I have reviewed the patient's chart and labs.  Questions were answered to the patient's satisfaction.     [name]

## 2015-12-30 ENCOUNTER — Encounter (HOSPITAL_COMMUNITY): Payer: Self-pay | Admitting: Gastroenterology

## 2016-01-13 ENCOUNTER — Encounter: Payer: Self-pay | Admitting: Internal Medicine

## 2016-01-13 ENCOUNTER — Non-Acute Institutional Stay (SKILLED_NURSING_FACILITY): Payer: Medicare Other | Admitting: Internal Medicine

## 2016-01-13 DIAGNOSIS — M79644 Pain in right finger(s): Secondary | ICD-10-CM | POA: Diagnosis not present

## 2016-01-13 DIAGNOSIS — R300 Dysuria: Secondary | ICD-10-CM

## 2016-01-13 DIAGNOSIS — K589 Irritable bowel syndrome without diarrhea: Secondary | ICD-10-CM | POA: Diagnosis not present

## 2016-01-13 NOTE — Progress Notes (Signed)
Patient ID: Gregory Kerr, male   DOB: 07/26/1940, 75 y.o.   MRN: [id number]    Location:    [facility] Room Number: [id number] Place of Service:  SNF (31)   Blanchie Serve, MD  Patient Care Team: Blanchie Serve, MD as PCP - General (Internal Medicine)  Extended Emergency Contact Information Primary Emergency Contact: [name],[name]          [place] Phone: [phone] Mobile Phone: [phone] Relation: Sister  Code Status:  Full code  Goals of care: Advanced Directive information Advanced Directives 12/29/2015  Does patient have an advance directive? No  Type of Advance Directive -  Does patient want to make changes to advanced directive? -  Copy of advanced directive(s) in chart? -  Would patient like information on creating an advanced directive? No - patient declined information     Chief Complaint  Patient presents with  . Medical Management of Chronic Issues    Routine Visit    Allergies  Allergen Reactions  . Penicillins Rash    HPI:  Pt is a 75 y.o. male seen today for routine visit. He has been seen by GI and had colonoscopy which showed internal hemorrhoids. He continues to have episode of loose stool once a day, mostly first thing in morning. He would like a second opinion. He complaints of right thumb pain and mentions this is bothering his transfers. He denies any numbness or tingling to his fingers.  Review of Systems Constitutional: Negative for fever.   HENT: Negative for congestion Respiratory: Negative for cough, shortness of breath and wheezing.    Cardiovascular: Negative for chest pain, palpitations, leg swelling.   Gastrointestinal: Negative for heartburn, melena, rectal bleed, nausea, vomiting. He has occasional abdominal pain.  Genitourinary: positive for dysuria. Negative for hematuria and flank pain Musculoskeletal: Negative for fall. Has muscle spasticity.  Skin: Negative for itching, rash.     Neurological: Negative for dizziness Psychiatric/Behavioral: Negative for depression    Past Medical History:  Diagnosis Date  . GERD (gastroesophageal reflux disease)   . Hernia    "large one; on my left stomach" (09/22/2014)  . Hiatal hernia 10/24/2012  . HTN (hypertension) 10/24/2012  . Hyperlipidemia   . Hypertension   . 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 Osteoporosis, unspecified 10/24/2012  . 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 Pneumonia    "once or twice" (09/22/2014)  . Stroke [facility]) 2007   "left side doesn't work now" (09/22/2014)   Past Surgical History:  Procedure Laterality Date  . APPENDECTOMY  05/27/2009   Ronnald Collum, MD  . CATARACT EXTRACTION W/ INTRAOCULAR LENS  IMPLANT, BILATERAL Bilateral ?2014  . CHOLECYSTECTOMY N/A 09/26/2014   Procedure: LAPAROSCOPIC CHOLECYSTECTOMY WITH INTRAOPERATIVE CHOLANGIOGRAM;  Surgeon: Donnie Mesa, MD;  Location: [facility];  Service: General;  Laterality: N/A;  . COLONOSCOPY WITH PROPOFOL N/A 12/29/2015   Procedure: COLONOSCOPY WITH PROPOFOL;  Surgeon: Wilford Corner, MD;  Location: [facility] ENDOSCOPY;  Service: Endoscopy;  Laterality: N/A;  . ERCP N/A 09/25/2014   Procedure: ENDOSCOPIC RETROGRADE CHOLANGIOPANCREATOGRAPHY (ERCP);  Surgeon: Clarene Essex, MD;  Location: [facility] ENDOSCOPY;  Service: Endoscopy;  Laterality: N/A;       Medication List       Accurate as of 01/13/16  2:19 PM. Always use your most recent med list.          azelastine 0.1 % nasal spray Commonly known as:  ASTELIN Place 1 spray into both nostrils 2 (two) times daily. Use in each  nostril as directed   baclofen 10 MG tablet Commonly known as:  LIORESAL Take one tablet by mouth four times daily for muscle spasm   calcium carbonate 1250 (500 Ca) MG tablet Commonly known as:  OS-CAL - dosed in mg of elemental calcium Take 1 tablet by mouth 2 (two) times daily with a meal. 500mg  elemental   Cholecalciferol 2000 units Tabs Take 2,000 Units by mouth daily.   dicyclomine 20 MG  tablet Commonly known as:  BENTYL Take 20 mg by mouth 3 (three) times daily.   diphenoxylate-atropine 2.5-0.025 MG tablet Commonly known as:  LOMOTIL Take 1 tablet by mouth every 8 (eight) hours as needed for diarrhea or loose stools.   dipyridamole-aspirin 200-25 MG 12hr capsule Commonly known as:  AGGRENOX Take one capsule by mouth twice daily for CVA   furosemide 20 MG tablet Commonly known as:  LASIX Take 10 mg by mouth daily.   gabapentin 100 MG capsule Commonly known as:  NEURONTIN Take 100 mg by mouth 3 (three) times daily.   loperamide 2 MG capsule Commonly known as:  IMODIUM Take 2 mg by mouth as needed for diarrhea or loose stools.   metoprolol tartrate 25 MG tablet Commonly known as:  LOPRESSOR Take 0.5 tablets (12.5 mg total) by mouth 2 (two) times daily.   nitroGLYCERIN 0.4 MG SL tablet Commonly known as:  NITROSTAT Place 0.4 mg under the tongue every 5 (five) minutes as needed for chest pain.   NUEDEXTA 20-10 MG Caps Generic drug:  Dextromethorphan-Quinidine Take one capsule by mouth once daily for PBA   oxycodone 5 MG capsule Commonly known as:  OXY-IR Take 5 mg by mouth every 6 (six) hours as needed for pain.   polyethylene glycol packet Commonly known as:  MIRALAX / GLYCOLAX Take 17 g by mouth daily as needed.   PREPLUS 27-1 MG Tabs Take 1 tablet by mouth 2 (two) times daily.   saccharomyces boulardii 250 MG capsule Commonly known as:  FLORASTOR Take 250 mg by mouth daily.   sertraline 25 MG tablet Commonly known as:  ZOLOFT Take 25 mg by mouth daily.   simvastatin 10 MG tablet Commonly known as:  ZOCOR Take 10 mg by mouth daily.        Immunization History  Administered Date(s) Administered  . Influenza-Unspecified 03/25/2013, 02/19/2014, 03/03/2014, 03/03/2015  . PPD Test 01/31/2013, 02/02/2014, 01/26/2015  . Pneumococcal-Unspecified 03/30/2011   Pertinent  Health Maintenance Due  Topic Date Due  . PNA vac Low Risk Adult (2 of  2 - PCV13) 05/22/2016 (Originally 03/29/2012)  . INFLUENZA VACCINE  05/22/2017 (Originally 12/21/2015)  . COLONOSCOPY  12/28/2025    Physical exam: Vitals:   01/13/16 1406  BP: 124/76  Pulse: 65  Resp: 19  Temp: 97.9 F (36.6 C)  TempSrc: Oral  SpO2: 94%  Weight: 163 lb 3.2 oz (74 kg)  Height: 5\' 9"  (1.753 m)   Body mass index is 24.1 kg/m.  General- elderly male in no acute distress Head- atraumatic, normocephalic Eyes- PERRLA, EOMI, no pallor, no icterus, no discharge Nose- no nasal discharge Neck- no cervical lymphadenopathy Throat- moist mucus membrane Cardiovascular- normal s1,s2, no murmur Respiratory- bilateral clear to auscultation, no wheeze, no rhonchi, no crackles, no use of accessory muscles Abdomen- bowel sounds present, soft, non tender Musculoskeletal- left sided hemiplegia, normal strength on right side, good ROM with right wrsit, tenderness on palpation to base of his right thumb which is prominent with thumb movement at MCP joint, no swelling  noted Neurological- left sided weakness post CVA Skin- warm and dry Psychiatry- alert and oriented to person, place and time   Labs reviewed:  Recent Labs  05/20/15 08/12/15  NA 142 141  K 3.9 4.3  BUN 19 23*  CREATININE 0.8 0.9   No results for input(s): AST, ALT, ALKPHOS, BILITOT, PROT, ALBUMIN in the last 8760 hours.  Recent Labs  05/20/15 07/02/15  WBC 6.0 5.8  HGB 14.1 14.6  HCT 44 44  PLT 196 174   Lab Results  Component Value Date   TSH 2.73 07/02/2015   Lab Results  Component Value Date   HGBA1C 5.7 07/02/2015   Lab Results  Component Value Date   CHOL 153 11/19/2015   HDL 36 11/19/2015   LDLCALC 96 11/19/2015   TRIG 104 11/19/2015   CHOLHDL 7.7 04/04/2014   07/02/15 b12- 793 Ferritin 47.7   Assessment/Plan  Right thumb pain No swelling and has full ROM with pain on flexion and tenderness to base of right thumb. Likely has tenosynovitis or arthritis pain. No signs of fracture on  exam. Recommend rest with tylenol 500 mg tid for now and will have him wear thumb spica splint for now. Advised to call for help with transfers for now. Monitor clinically and will need reassessment if no improvement  Dysuria Send u/a with c/s, hydration encouraged, start pyridium x 3 days  IBS With one episode of loose bowel movement a day. Currently on probiotic, imodium and fiber supplement along with bentyl. Pending another GI consult for second opinion. Sister is working to get this scheduled.    Blanchie Serve, MD Internal Medicine [facility] [address] [place], [place] [address] Cell Phone (Monday-Friday 8 am - 5 pm): [phone] On Call: [phone] and follow prompts after 5 pm and on weekends Office Phone: [phone] Office Fax: [phone]

## 2016-02-16 ENCOUNTER — Encounter: Payer: Self-pay | Admitting: Internal Medicine

## 2016-02-16 ENCOUNTER — Non-Acute Institutional Stay (SKILLED_NURSING_FACILITY): Payer: Medicare Other | Admitting: Internal Medicine

## 2016-02-16 DIAGNOSIS — K589 Irritable bowel syndrome without diarrhea: Secondary | ICD-10-CM | POA: Diagnosis not present

## 2016-02-16 DIAGNOSIS — M792 Neuralgia and neuritis, unspecified: Secondary | ICD-10-CM

## 2016-02-16 DIAGNOSIS — E559 Vitamin D deficiency, unspecified: Secondary | ICD-10-CM | POA: Diagnosis not present

## 2016-02-16 DIAGNOSIS — K591 Functional diarrhea: Secondary | ICD-10-CM | POA: Diagnosis not present

## 2016-02-16 DIAGNOSIS — F329 Major depressive disorder, single episode, unspecified: Secondary | ICD-10-CM | POA: Insufficient documentation

## 2016-02-16 NOTE — Progress Notes (Signed)
Patient ID: Gregory Kerr, male   DOB: [birth year], 75 y.o.   MRN: [id number]    Location:    Nursing Home Room Number: [id number] Place of Service:  SNF (31)   Blanchie Serve, MD  Patient Care Team: Blanchie Serve, MD as PCP - General (Internal Medicine)  Extended Emergency Contact Information Primary Emergency Contact: [name],[name]          [place] Phone: [phone] Mobile Phone: [phone] Relation: Sister  Code Status:  Full code  Goals of care: Advanced Directive information Advanced Directives 12/29/2015  Does patient have an advance directive? No  Type of Advance Directive -  Does patient want to make changes to advanced directive? -  Copy of advanced directive(s) in chart? -  Would patient like information on creating an advanced directive? No - patient declined information     Chief Complaint  Patient presents with  . Medical Management of Chronic Issues    Routine Visit    Allergies  Allergen Reactions  . Penicillins Rash    HPI:  Pt is a 75 y.o. male seen today for routine visit. He continues to have loose stool every other day in the morning and is frustrated about loss of bowel control during these episodes. He gets an urgency and is unable to control his bowel movement. Denies any blood in stool. Stool is loose to semi formed. He has been seen by GI and had colonoscopy which showed internal hemorrhoids. He would like a second opinion. Imodium helps him some. His sister has been working on getting him a second opinion.    Review of Systems Constitutional: Negative for fever.   HENT: Negative for congestion Respiratory: Negative for cough, shortness of breath and wheezing.    Cardiovascular: Negative for chest pain, palpitations, leg swelling.   Gastrointestinal: Negative for heartburn, melena, rectal bleed, nausea, vomiting. He has occasional abdominal pain.  Genitourinary: Negative for hematuria, dysuria and flank  pain Musculoskeletal: Negative for fall. Has muscle spasticity.  Skin: Negative for itching, rash.   Neurological: Negative for dizziness Psychiatric/Behavioral: Negative for depression    Past Medical History:  Diagnosis Date  . GERD (gastroesophageal reflux disease)   . Hernia    "large one; on my left stomach" (09/22/2014)  . Hiatal hernia 10/24/2012  . HTN (hypertension) 10/24/2012  . Hyperlipidemia   . Hypertension   . 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 Osteoporosis, unspecified 10/24/2012  . 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 Pneumonia    "once or twice" (09/22/2014)  . Stroke [facility]) 2007   "left side doesn't work now" (09/22/2014)   Past Surgical History:  Procedure Laterality Date  . APPENDECTOMY  05/27/2009   Ronnald Collum, MD  . CATARACT EXTRACTION W/ INTRAOCULAR LENS  IMPLANT, BILATERAL Bilateral ?2014  . CHOLECYSTECTOMY N/A 09/26/2014   Procedure: LAPAROSCOPIC CHOLECYSTECTOMY WITH INTRAOPERATIVE CHOLANGIOGRAM;  Surgeon: Donnie Mesa, MD;  Location: [place];  Service: General;  Laterality: N/A;  . COLONOSCOPY WITH PROPOFOL N/A 12/29/2015   Procedure: COLONOSCOPY WITH PROPOFOL;  Surgeon: Wilford Corner, MD;  Location: [facility] ENDOSCOPY;  Service: Endoscopy;  Laterality: N/A;  . ERCP N/A 09/25/2014   Procedure: ENDOSCOPIC RETROGRADE CHOLANGIOPANCREATOGRAPHY (ERCP);  Surgeon: Clarene Essex, MD;  Location: [facility] ENDOSCOPY;  Service: Endoscopy;  Laterality: N/A;       Medication List       Accurate as of 02/16/16  3:35 PM. Always use your most recent med list.          azelastine  0.1 % nasal spray Commonly known as:  ASTELIN Place 1 spray into both nostrils 2 (two) times daily. Use in each nostril as directed   baclofen 10 MG tablet Commonly known as:  LIORESAL Take one tablet by mouth four times daily for muscle spasm   calcium carbonate 1250 (500 Ca) MG tablet Commonly known as:  OS-CAL - dosed in mg of elemental calcium Take 1 tablet by mouth 2 (two) times daily with a meal. 500mg   elemental   Cholecalciferol 2000 units Tabs Take 2,000 Units by mouth daily.   dicyclomine 20 MG tablet Commonly known as:  BENTYL Take 20 mg by mouth 3 (three) times daily.   diphenoxylate-atropine 2.5-0.025 MG tablet Commonly known as:  LOMOTIL Take 1 tablet by mouth every 8 (eight) hours as needed for diarrhea or loose stools.   dipyridamole-aspirin 200-25 MG 12hr capsule Commonly known as:  AGGRENOX Take one capsule by mouth twice daily for CVA   furosemide 20 MG tablet Commonly known as:  LASIX Take 10 mg by mouth daily.   gabapentin 100 MG capsule Commonly known as:  NEURONTIN Take 100 mg by mouth 3 (three) times daily.   loperamide 2 MG capsule Commonly known as:  IMODIUM Take 2 mg by mouth every 6 (six) hours as needed for diarrhea or loose stools.   metoprolol tartrate 25 MG tablet Commonly known as:  LOPRESSOR Take 0.5 tablets (12.5 mg total) by mouth 2 (two) times daily.   nitroGLYCERIN 0.4 MG SL tablet Commonly known as:  NITROSTAT Place 0.4 mg under the tongue every 5 (five) minutes as needed for chest pain.   NUEDEXTA 20-10 MG Caps Generic drug:  Dextromethorphan-Quinidine Take one capsule by mouth once daily for PBA   oxycodone 5 MG capsule Commonly known as:  OXY-IR Take 5 mg by mouth every 6 (six) hours as needed for pain.   polyethylene glycol packet Commonly known as:  MIRALAX / GLYCOLAX Take 17 g by mouth daily as needed.   PREPLUS 27-1 MG Tabs Take 1 tablet by mouth 2 (two) times daily.   saccharomyces boulardii 250 MG capsule Commonly known as:  FLORASTOR Take 250 mg by mouth daily.   sertraline 25 MG tablet Commonly known as:  ZOLOFT Take 25 mg by mouth daily.   simvastatin 10 MG tablet Commonly known as:  ZOCOR Take 10 mg by mouth daily.        Immunization History  Administered Date(s) Administered  . Influenza-Unspecified 03/25/2013, 02/19/2014, 03/03/2014, 03/03/2015  . PPD Test 01/31/2013, 02/02/2014, 01/26/2015  .  Pneumococcal-Unspecified 03/30/2011   Pertinent  Health Maintenance Due  Topic Date Due  . PNA vac Low Risk Adult (2 of 2 - PCV13) 05/22/2016 (Originally 03/29/2012)  . INFLUENZA VACCINE  05/22/2017 (Originally 12/21/2015)  . COLONOSCOPY  12/28/2025    Physical exam: Vitals:   02/16/16 1528  BP: (!) 113/54  Pulse: 60  Temp: 97.5 F (36.4 C)  TempSrc: Oral  SpO2: 97%  Weight: 162 lb (73.5 kg)  Height: 5\' 9"  (1.753 m)   Body mass index is 23.92 kg/m.  General- elderly male in no acute distress Head- atraumatic, normocephalic Eyes- PERRLA, EOMI, no pallor, no icterus, no discharge Nose- no nasal discharge Neck- no cervical lymphadenopathy Throat- moist mucus membrane Cardiovascular- normal s1,s2, no murmur Respiratory- bilateral clear to auscultation, no wheeze, no rhonchi, no crackles, no use of accessory muscles Abdomen- bowel sounds present, soft, non tender Musculoskeletal- left sided hemiplegia, normal strength on right side, wheels himself on  wheelchair Neurological- left sided weakness post CVA Skin- warm and dry Psychiatry- alert and oriented to person, place and time   Labs reviewed:  Recent Labs  05/20/15 08/12/15  NA 142 141  K 3.9 4.3  BUN 19 23*  CREATININE 0.8 0.9   No results for input(s): AST, ALT, ALKPHOS, BILITOT, PROT, ALBUMIN in the last 8760 hours.  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 Change his bentyl to 20 mg qid for now and monitor. Will have him follow up with GI for further evaluation.  Continue his florastor,  imodium prn and fiber supplement  Diarrhea Associated with IBS. Continue imodium on need basis and monitor. Afebrile. Well hydrated.  Vitamin d def Continue vitamin d 2000 u daily and monitor  Chronic depression Stable mood, continue sertraline 25 mg daily and monitor  Neuropathic pain Continue his gabapentin, no changes made     Blanchie Serve, MD Internal Medicine [facility] [address] [place], [place] [address] Cell Phone (Monday-Friday 8 am - 5 pm): [phone] On Call: [phone] and follow prompts after 5 pm and on weekends Office Phone: [phone] Office Fax: [phone]

## 2016-02-27 ENCOUNTER — Emergency Department (HOSPITAL_COMMUNITY): Payer: Medicare Other

## 2016-02-27 ENCOUNTER — Encounter (HOSPITAL_COMMUNITY): Payer: Self-pay | Admitting: Emergency Medicine

## 2016-02-27 ENCOUNTER — Observation Stay (HOSPITAL_COMMUNITY)
Admission: EM | Admit: 2016-02-27 | Discharge: 2016-02-28 | Disposition: A | Discharge status: Skilled Nursing Facility | Payer: Medicare Other | Attending: Internal Medicine | Admitting: Internal Medicine

## 2016-02-27 DIAGNOSIS — E785 Hyperlipidemia, unspecified: Secondary | ICD-10-CM | POA: Diagnosis not present

## 2016-02-27 DIAGNOSIS — Z9889 Other specified postprocedural states: Secondary | ICD-10-CM | POA: Insufficient documentation

## 2016-02-27 DIAGNOSIS — J449 Chronic obstructive pulmonary disease, unspecified: Secondary | ICD-10-CM | POA: Diagnosis present

## 2016-02-27 DIAGNOSIS — I5032 Chronic diastolic (congestive) heart failure: Secondary | ICD-10-CM | POA: Diagnosis not present

## 2016-02-27 DIAGNOSIS — I638 Other cerebral infarction: Secondary | ICD-10-CM

## 2016-02-27 DIAGNOSIS — Z8249 Family history of ischemic heart disease and other diseases of the circulatory system: Secondary | ICD-10-CM | POA: Insufficient documentation

## 2016-02-27 DIAGNOSIS — R079 Chest pain, unspecified: Secondary | ICD-10-CM | POA: Diagnosis present

## 2016-02-27 DIAGNOSIS — Z79891 Long term (current) use of opiate analgesic: Secondary | ICD-10-CM | POA: Insufficient documentation

## 2016-02-27 DIAGNOSIS — F329 Major depressive disorder, single episode, unspecified: Secondary | ICD-10-CM | POA: Diagnosis not present

## 2016-02-27 DIAGNOSIS — Z961 Presence of intraocular lens: Secondary | ICD-10-CM | POA: Insufficient documentation

## 2016-02-27 DIAGNOSIS — K449 Diaphragmatic hernia without obstruction or gangrene: Secondary | ICD-10-CM

## 2016-02-27 DIAGNOSIS — Z9842 Cataract extraction status, left eye: Secondary | ICD-10-CM | POA: Diagnosis not present

## 2016-02-27 DIAGNOSIS — Z87891 Personal history of nicotine dependence: Secondary | ICD-10-CM | POA: Insufficient documentation

## 2016-02-27 DIAGNOSIS — Z880 Allergy status to penicillin: Secondary | ICD-10-CM | POA: Diagnosis not present

## 2016-02-27 DIAGNOSIS — Z79899 Other long term (current) drug therapy: Secondary | ICD-10-CM | POA: Insufficient documentation

## 2016-02-27 DIAGNOSIS — G894 Chronic pain syndrome: Secondary | ICD-10-CM | POA: Diagnosis present

## 2016-02-27 DIAGNOSIS — D72829 Elevated white blood cell count, unspecified: Secondary | ICD-10-CM | POA: Diagnosis not present

## 2016-02-27 DIAGNOSIS — I639 Cerebral infarction, unspecified: Principal | ICD-10-CM | POA: Diagnosis present

## 2016-02-27 DIAGNOSIS — G8114 Spastic hemiplegia affecting left nondominant side: Secondary | ICD-10-CM | POA: Diagnosis not present

## 2016-02-27 DIAGNOSIS — I2510 Atherosclerotic heart disease of native coronary artery without angina pectoris: Secondary | ICD-10-CM | POA: Diagnosis not present

## 2016-02-27 DIAGNOSIS — Z9841 Cataract extraction status, right eye: Secondary | ICD-10-CM | POA: Diagnosis not present

## 2016-02-27 DIAGNOSIS — I712 Thoracic aortic aneurysm, without rupture: Secondary | ICD-10-CM | POA: Insufficient documentation

## 2016-02-27 DIAGNOSIS — Z800 Family history of malignant neoplasm of digestive organs: Secondary | ICD-10-CM | POA: Insufficient documentation

## 2016-02-27 DIAGNOSIS — K580 Irritable bowel syndrome with diarrhea: Secondary | ICD-10-CM | POA: Diagnosis not present

## 2016-02-27 DIAGNOSIS — I110 Hypertensive heart disease with heart failure: Secondary | ICD-10-CM | POA: Insufficient documentation

## 2016-02-27 DIAGNOSIS — I700 Atherosclerosis of aorta: Secondary | ICD-10-CM | POA: Diagnosis not present

## 2016-02-27 DIAGNOSIS — F482 Pseudobulbar affect: Secondary | ICD-10-CM | POA: Insufficient documentation

## 2016-02-27 DIAGNOSIS — Z8049 Family history of malignant neoplasm of other genital organs: Secondary | ICD-10-CM | POA: Insufficient documentation

## 2016-02-27 DIAGNOSIS — K219 Gastro-esophageal reflux disease without esophagitis: Secondary | ICD-10-CM | POA: Diagnosis present

## 2016-02-27 DIAGNOSIS — Z9049 Acquired absence of other specified parts of digestive tract: Secondary | ICD-10-CM | POA: Insufficient documentation

## 2016-02-27 DIAGNOSIS — K589 Irritable bowel syndrome without diarrhea: Secondary | ICD-10-CM | POA: Diagnosis present

## 2016-02-27 DIAGNOSIS — I7121 Aneurysm of the ascending aorta, without rupture: Secondary | ICD-10-CM

## 2016-02-27 LAB — URINALYSIS, ROUTINE W REFLEX MICROSCOPIC
Bilirubin Urine: NEGATIVE
Glucose, UA: NEGATIVE mg/dL
Ketones, ur: NEGATIVE mg/dL
NITRITE: POSITIVE — AB
PROTEIN: NEGATIVE mg/dL
Specific Gravity, Urine: 1.01 (ref 1.005–1.030)
pH: 6.5 (ref 5.0–8.0)

## 2016-02-27 LAB — I-STAT TROPONIN, ED: TROPONIN I, POC: 0 ng/mL (ref 0.00–0.08)

## 2016-02-27 LAB — CBC
HCT: 44.2 % (ref 39.0–52.0)
HEMOGLOBIN: 14.4 g/dL (ref 13.0–17.0)
MCH: 29.4 pg (ref 26.0–34.0)
MCHC: 32.6 g/dL (ref 30.0–36.0)
MCV: 90.4 fL (ref 78.0–100.0)
PLATELETS: 173 10*3/uL (ref 150–400)
RBC: 4.89 MIL/uL (ref 4.22–5.81)
RDW: 13.8 % (ref 11.5–15.5)
WBC: 13.4 10*3/uL — ABNORMAL HIGH (ref 4.0–10.5)

## 2016-02-27 LAB — TROPONIN I: Troponin I: <0.03 ng/mL (ref <0.03)

## 2016-02-27 LAB — MRSA PCR SCREENING: MRSA BY PCR: NEGATIVE

## 2016-02-27 LAB — BASIC METABOLIC PANEL
ANION GAP: 12 (ref 5–15)
BUN: 18 mg/dL (ref 6–20)
CALCIUM: 8.4 mg/dL — AB (ref 8.9–10.3)
CO2: 21 mmol/L — ABNORMAL LOW (ref 22–32)
Chloride: 106 mmol/L (ref 101–111)
Creatinine, Ser: 0.87 mg/dL (ref 0.61–1.24)
GLUCOSE: 97 mg/dL (ref 65–99)
Potassium: 4 mmol/L (ref 3.5–5.1)
Sodium: 139 mmol/L (ref 135–145)

## 2016-02-27 LAB — URINE MICROSCOPIC-ADD ON

## 2016-02-27 MED ORDER — IOPAMIDOL (ISOVUE-370) INJECTION 76%
INTRAVENOUS | Status: AC
  Administered 2016-02-27: 100 mL
  Filled 2016-02-27: qty 100

## 2016-02-27 MED ORDER — GI COCKTAIL ~~LOC~~
30.0000 mL | Freq: Once | ORAL | Status: AC
  Administered 2016-02-27: 30 mL via ORAL
  Filled 2016-02-27: qty 30

## 2016-02-27 MED ORDER — GI COCKTAIL ~~LOC~~
30.0000 mL | Freq: Four times a day (QID) | ORAL | Status: DC | PRN
  Administered 2016-02-27: 30 mL via ORAL
  Filled 2016-02-27: qty 30

## 2016-02-27 MED ORDER — ACETAMINOPHEN 325 MG PO TABS: 650.0000 mg | ORAL_TABLET | ORAL | Status: DC | PRN

## 2016-02-27 MED ORDER — GABAPENTIN 100 MG PO CAPS
100.0000 mg | ORAL_CAPSULE | Freq: Three times a day (TID) | ORAL | Status: DC
  Administered 2016-02-27 – 2016-02-28 (×2): 100 mg via ORAL
  Filled 2016-02-27 (×3): qty 1

## 2016-02-27 MED ORDER — BACLOFEN 10 MG PO TABS
10.0000 mg | ORAL_TABLET | Freq: Four times a day (QID) | ORAL | Status: DC
  Administered 2016-02-27 – 2016-02-28 (×2): 10 mg via ORAL
  Filled 2016-02-27 (×7): qty 1

## 2016-02-27 MED ORDER — ORAL CARE MOUTH RINSE
15.0000 mL | Freq: Two times a day (BID) | OROMUCOSAL | Status: DC
  Administered 2016-02-27: 15 mL via OROMUCOSAL

## 2016-02-27 MED ORDER — ONDANSETRON HCL 4 MG/2ML IJ SOLN: 4.0000 mg | Freq: Four times a day (QID) | INTRAMUSCULAR | Status: DC | PRN

## 2016-02-27 MED ORDER — MORPHINE SULFATE (PF) 4 MG/ML IV SOLN
4.0000 mg | Freq: Once | INTRAVENOUS | Status: AC
  Administered 2016-02-27: 4 mg via INTRAVENOUS
  Filled 2016-02-27: qty 1

## 2016-02-27 MED ORDER — SERTRALINE HCL 25 MG PO TABS
25.0000 mg | ORAL_TABLET | Freq: Every day | ORAL | Status: DC
  Administered 2016-02-27 – 2016-02-28 (×2): 25 mg via ORAL
  Filled 2016-02-27 (×2): qty 1

## 2016-02-27 MED ORDER — SODIUM CHLORIDE 0.9 % IV SOLN
INTRAVENOUS | Status: DC
  Administered 2016-02-27: 18:00:00 via INTRAVENOUS

## 2016-02-27 MED ORDER — SENNOSIDES-DOCUSATE SODIUM 8.6-50 MG PO TABS: 1.0000 | ORAL_TABLET | Freq: Every evening | ORAL | Status: DC | PRN

## 2016-02-27 MED ORDER — BISACODYL 10 MG RE SUPP: 10.0000 mg | Freq: Every day | RECTAL | Status: DC | PRN

## 2016-02-27 MED ORDER — ASPIRIN-DIPYRIDAMOLE ER 25-200 MG PO CP12
1.0000 | ORAL_CAPSULE | Freq: Two times a day (BID) | ORAL | Status: DC
  Administered 2016-02-27 – 2016-02-28 (×2): 1 via ORAL
  Filled 2016-02-27 (×5): qty 1

## 2016-02-27 MED ORDER — MORPHINE SULFATE (PF) 2 MG/ML IV SOLN
2.0000 mg | INTRAVENOUS | Status: DC | PRN
Start: 2016-02-27 — End: 2016-02-28

## 2016-02-27 MED ORDER — DEXTROMETHORPHAN-QUINIDINE 20-10 MG PO CAPS: 1.0000 | ORAL_CAPSULE | Freq: Every day | ORAL | Status: DC

## 2016-02-27 MED ORDER — SIMVASTATIN 10 MG PO TABS
10.0000 mg | ORAL_TABLET | Freq: Every day | ORAL | Status: DC
  Administered 2016-02-27 – 2016-02-28 (×2): 10 mg via ORAL
  Filled 2016-02-27 (×2): qty 1

## 2016-02-27 MED ORDER — DICYCLOMINE HCL 20 MG PO TABS
20.0000 mg | ORAL_TABLET | Freq: Four times a day (QID) | ORAL | Status: DC
  Administered 2016-02-27 – 2016-02-28 (×2): 20 mg via ORAL
  Filled 2016-02-27 (×7): qty 1

## 2016-02-27 MED ORDER — PROMETHAZINE HCL 25 MG PO TABS: 12.5000 mg | ORAL_TABLET | Freq: Four times a day (QID) | ORAL | Status: DC | PRN

## 2016-02-27 MED ORDER — OXYCODONE HCL 5 MG PO TABS: 5.0000 mg | ORAL_TABLET | Freq: Four times a day (QID) | ORAL | Status: DC | PRN

## 2016-02-27 MED ORDER — MAGNESIUM CITRATE PO SOLN: 1.0000 | Freq: Once | ORAL | Status: DC | PRN

## 2016-02-27 MED ORDER — FAMOTIDINE IN NACL 20-0.9 MG/50ML-% IV SOLN
20.0000 mg | Freq: Two times a day (BID) | INTRAVENOUS | Status: DC
  Administered 2016-02-27 – 2016-02-28 (×2): 20 mg via INTRAVENOUS
  Filled 2016-02-27 (×4): qty 50

## 2016-02-27 MED ORDER — SODIUM CHLORIDE 0.9% FLUSH
3.0000 mL | Freq: Two times a day (BID) | INTRAVENOUS | Status: DC
  Administered 2016-02-27: 3 mL via INTRAVENOUS

## 2016-02-27 MED ORDER — FUROSEMIDE 20 MG PO TABS
10.0000 mg | ORAL_TABLET | Freq: Every day | ORAL | Status: DC
  Administered 2016-02-27 – 2016-02-28 (×2): 10 mg via ORAL
  Filled 2016-02-27 (×2): qty 1

## 2016-02-27 MED ORDER — HYDROCODONE-ACETAMINOPHEN 5-325 MG PO TABS: 1.0000 | ORAL_TABLET | ORAL | Status: DC | PRN

## 2016-02-27 MED ORDER — METOPROLOL TARTRATE 12.5 MG HALF TABLET
12.5000 mg | ORAL_TABLET | Freq: Two times a day (BID) | ORAL | Status: DC
  Administered 2016-02-27 – 2016-02-28 (×2): 12.5 mg via ORAL
  Filled 2016-02-27 (×3): qty 1

## 2016-02-27 NOTE — H&P (Signed)
History and Physical    Gregory Kerr [id number] DOB: 06/02/1940 DOA: 02/27/2016   PCP: Blanchie Serve, MD   Patient coming from:SNF    Chief Complaint: Chest Pain   HPI: Gregory Kerr is a 75 y.o. male with medical history significant for HTN, HLD, prior CVA with residual L sided weakness and dysphagia, thoracic aortic aneurysm, resident of [facility], brought to to the ED after waking up with Left shest pain , severe, unresolving after 3 NTG . EMS gave him ASA 324 and put him on O2, still with symptoms on presentation. He never had similar symptoms. He reported associated diaphoresis with the patient was severe, winnings. 30 chest pain. He denies any fever or chills. He denies any worsening shortness of breath, call for a abdominal pain. He denies any nausea or vomiting. Of note, the patient has chronic right calf pain, without any swelling. The patient is relatively in mobile in the nursing home. No recent long distance trip. No hormonal therapy. He is on chronic Aggrenox. He has never been seen by a cardiologist.  ED Course:  BP 137/83   Pulse 82   Temp 98.6 F (37 C) (Oral)   Resp 20   Ht 5\' 9"  (1.753 m)   Wt 72.6 kg (160 lb)   SpO2 96%   BMI 23.63 kg/m    his last 2D echo was in 2015, ejection fraction 60 to 65%. Never had a catheterization. Troponin is negative. EKG without ACS. Chest x-ray without acute abnormalities. He has atelectasis, which is chronic. CT angiography of the chest was negative for PE. He does show a large hiatal hernia. In addition, a known thoracic aortic aneurysm is noted, measuring 4.8 x 4.5 cm. Stable when compared to prior cell films.  At the ED, he received G.I. cocktail, with significant improvement in his symptoms. CBC remark for WBC 13.4  and CMET  Unremarkable  Review of Systems: As per HPI otherwise 10 point review of systems negative.   Past Medical History:  Diagnosis Date  . GERD (gastroesophageal reflux disease)   . Hernia    "large  one; on my left stomach" (09/22/2014)  . Hiatal hernia 10/24/2012  . HTN (hypertension) 10/24/2012  . Hyperlipidemia   . Hypertension   . 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 Osteoporosis, unspecified 10/24/2012  . 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 Pneumonia    "once or twice" (09/22/2014)  . Stroke [facility]) 2007   "left side doesn't work now" (09/22/2014)    Past Surgical History:  Procedure Laterality Date  . APPENDECTOMY  05/27/2009   Ronnald Collum, MD  . CATARACT EXTRACTION W/ INTRAOCULAR LENS  IMPLANT, BILATERAL Bilateral ?2014  . CHOLECYSTECTOMY N/A 09/26/2014   Procedure: LAPAROSCOPIC CHOLECYSTECTOMY WITH INTRAOPERATIVE CHOLANGIOGRAM;  Surgeon: Donnie Mesa, MD;  Location: [place];  Service: General;  Laterality: N/A;  . COLONOSCOPY WITH PROPOFOL N/A 12/29/2015   Procedure: COLONOSCOPY WITH PROPOFOL;  Surgeon: Wilford Corner, MD;  Location: [facility] ENDOSCOPY;  Service: Endoscopy;  Laterality: N/A;  . ERCP N/A 09/25/2014   Procedure: ENDOSCOPIC RETROGRADE CHOLANGIOPANCREATOGRAPHY (ERCP);  Surgeon: Clarene Essex, MD;  Location: [facility] ENDOSCOPY;  Service: Endoscopy;  Laterality: N/A;    Social History Social History   Social History  . Marital status: Single    Spouse name: N/A  . Number of children: N/A  . Years of education: N/A   Occupational History  . Not on file.   Social History Main Topics  . Smoking status: Former Smoker  Packs/day: 0.50    Types: Cigarettes    Quit date: 12/20/2005  . Smokeless tobacco: Never Used  . Alcohol use Yes     Comment: 09/22/2014 "stopped in 2007; drank occasionally when I did drink"  . Drug use: No  . Sexual activity: No   Other Topics Concern  . Not on file   Social History Narrative  . No narrative on file     Allergies  Allergen Reactions  . Penicillins Rash    Family History  Problem Relation Age of Onset  . Heart attack Mother   . Heart attack Father   . Cancer Maternal Aunt     Stomach  . Cancer Paternal [name]       Prior to Admission medications   Medication Sig Start Date End Date Taking? Authorizing Provider  azelastine (ASTELIN) 0.1 % nasal spray Place 1 spray into both nostrils 2 (two) times daily. Use in each nostril as directed   Yes Historical Provider, MD  baclofen (LIORESAL) 10 MG tablet Take one tablet by mouth four times daily for muscle spasm   Yes Historical Provider, MD  calcium carbonate (OS-CAL - DOSED IN MG OF ELEMENTAL CALCIUM) 1250 MG tablet Take 1 tablet by mouth 2 (two) times daily with a meal. 500mg  elemental   Yes Historical Provider, MD  Cholecalciferol 2000 units TABS Take 2,000 Units by mouth daily.   Yes Historical Provider, MD  Dextromethorphan-Quinidine (NUEDEXTA) 20-10 MG CAPS Take one capsule by mouth once daily for PBA   Yes Historical Provider, MD  dicyclomine (BENTYL) 20 MG tablet Take 20 mg by mouth 4 (four) times daily.    Yes Historical Provider, MD  diphenoxylate-atropine (LOMOTIL) 2.5-0.025 MG tablet Take 1 tablet by mouth every 8 (eight) hours as needed for diarrhea or loose stools.    Yes Historical Provider, MD  dipyridamole-aspirin (AGGRENOX) 200-25 MG per 12 hr capsule Take one capsule by mouth twice daily for CVA   Yes Historical Provider, MD  furosemide (LASIX) 20 MG tablet Take 10 mg by mouth daily.   Yes Historical Provider, MD  gabapentin (NEURONTIN) 100 MG capsule Take 100 mg by mouth 3 (three) times daily.   Yes Historical Provider, MD  metoprolol tartrate (LOPRESSOR) 25 MG tablet Take 0.5 tablets (12.5 mg total) by mouth 2 (two) times daily. 04/06/14  Yes Verlee Monte, MD  nitroGLYCERIN (NITROSTAT) 0.4 MG SL tablet Place 0.4 mg under the tongue every 5 (five) minutes as needed for chest pain.   Yes Historical Provider, MD  oxycodone (OXY-IR) 5 MG capsule Take 5 mg by mouth every 6 (six) hours as needed for pain.    Yes Historical Provider, MD  polyethylene glycol (MIRALAX / GLYCOLAX) packet Take 17 g by mouth daily as needed.    Yes Historical Provider, MD   Prenatal Vit-Fe Fumarate-FA (PREPLUS) 27-1 MG TABS Take 1 tablet by mouth 2 (two) times daily.   Yes Historical Provider, MD  saccharomyces boulardii (FLORASTOR) 250 MG capsule Take 250 mg by mouth daily.    Yes Historical Provider, MD  sertraline (ZOLOFT) 25 MG tablet Take 25 mg by mouth daily.   Yes Historical Provider, MD  simvastatin (ZOCOR) 10 MG tablet Take 10 mg by mouth daily.   Yes Historical Provider, MD  loperamide (IMODIUM) 2 MG capsule Take 2 mg by mouth every 6 (six) hours as needed for diarrhea or loose stools.     Historical Provider, MD    Physical Exam:  Vitals:   02/27/16 1030 02/27/16 1100 02/27/16 1130 02/27/16 1200  BP: 118/65 125/75 136/66 137/83  Pulse: 76 86 85 82  Resp: 18 21 24 20   Temp:      TempSrc:      SpO2: 96% 96% 93% 96%  Weight:      Height:           Constitutional: NAD, calm, comfortable Vitals:   02/27/16 1030 02/27/16 1100 02/27/16 1130 02/27/16 1200  BP: 118/65 125/75 136/66 137/83  Pulse: 76 86 85 82  Resp: 18 21 24 20   Temp:      TempSrc:      SpO2: 96% 96% 93% 96%  Weight:      Height:       Eyes: PERRL, lids and conjunctivae normal ENMT: Mucous membranes are moist. Posterior pharynx clear of any exudate or lesions.Normal dentition.  Neck: normal, supple, no masses, no thyromegaly Respiratory: trace crackles on the LLL with some atelectatic sounds, no wheezing,   Normal respiratory effort. No accessory muscle use.  Cardiovascular: Regular rate and rhythm, no murmurs / rubs / gallops. No extremity edema. 2+ pedal pulses. No carotid bruits.  Abdomen: no tenderness, no masses palpated. No hepatosplenomegaly. Bowel sounds positive.  Musculoskeletal: no clubbing / cyanosis. Left hand contracted. Some muscle wasting is noted. No tenderness or erythema is noted on the left shoulder where flu shot was given  Skin: no rashes, lesions, ulcers.  Neurologic: left-sided weakness, mild dysarthria. Known foot drop, known dysphagia   Psychiatric: Normal judgment and insight. Alert and oriented x 3. Flat affect      Labs on Admission: I have personally reviewed following labs and imaging studies  CBC:  Recent Labs Lab 02/27/16 0646  WBC 13.4*  HGB 14.4  HCT 44.2  MCV 90.4  PLT [id number]    Basic Metabolic Panel:  Recent Labs Lab 02/27/16 0646  NA 139  K 4.0  CL 106  CO2 21*  GLUCOSE 97  BUN 18  CREATININE 0.87  CALCIUM 8.4*    GFR: Estimated Creatinine Clearance: 76.7 mL/min (by C-G formula based on SCr of 0.87 mg/dL).  Liver Function Tests: No results for input(s): AST, ALT, ALKPHOS, BILITOT, PROT, ALBUMIN in the last 168 hours. No results for input(s): LIPASE, AMYLASE in the last 168 hours. No results for input(s): AMMONIA in the last 168 hours.  Coagulation Profile: No results for input(s): INR, PROTIME in the last 168 hours.  Cardiac Enzymes: No results for input(s): CKTOTAL, CKMB, CKMBINDEX, TROPONINI in the last 168 hours.  BNP (last 3 results) No results for input(s): PROBNP in the last 8760 hours.  HbA1C: No results for input(s): HGBA1C in the last 72 hours.  CBG: No results for input(s): GLUCAP in the last 168 hours.  Lipid Profile: No results for input(s): CHOL, HDL, LDLCALC, TRIG, CHOLHDL, LDLDIRECT in the last 72 hours.  Thyroid Function Tests: No results for input(s): TSH, T4TOTAL, FREET4, T3FREE, THYROIDAB in the last 72 hours.  Anemia Panel: No results for input(s): VITAMINB12, FOLATE, FERRITIN, TIBC, IRON, RETICCTPCT in the last 72 hours.  Urine analysis:    Component Value Date/Time   COLORURINE AMBER (A) 09/22/2014 0726   APPEARANCEUR CLEAR 09/22/2014 0726   LABSPEC 1.025 09/22/2014 0726   PHURINE 6.0 09/22/2014 0726   GLUCOSEU NEGATIVE 09/22/2014 0726   HGBUR NEGATIVE 09/22/2014 0726   BILIRUBINUR MODERATE (A) 09/22/2014 0726   KETONESUR 15 (A) 09/22/2014 0726   PROTEINUR NEGATIVE 09/22/2014 0726   UROBILINOGEN 1.0 09/22/2014  [id number]   NITRITE NEGATIVE  09/22/2014 0726   LEUKOCYTESUR TRACE (A) 09/22/2014 0726    Sepsis Labs: @LABRCNTIP (procalcitonin:4,lacticidven:4) )No results found for this or any previous visit (from the past 240 hour(s)).   Radiological Exams on Admission: Dg Chest 2 View  Result Date: 02/27/2016 CLINICAL DATA:  Chest pain and shortness of breath EXAM: CHEST  2 VIEW COMPARISON:  Sep 26, 2014 FINDINGS: There is a large hiatal hernia. There is scarring in the lung bases. There is no frank edema or consolidation. There is mild atelectasis in the left lower lobe region, chronic. Heart is upper normal in size with pulmonary vascularity within normal limits. There is atherosclerotic calcification in the aorta. Bones are osteoporotic. No adenopathy evident. IMPRESSION: Large hiatal hernia. Left lower lobe atelectasis with mild bibasilar scarring. Stable cardiac silhouette. There is aortic atherosclerosis. Bones appear osteoporotic. Electronically Signed   By: Lowella Grip III M.D.   On: 02/27/2016 08:18   Ct Angio Chest Pe W/cm &/or Wo Cm  Result Date: 02/27/2016 CLINICAL DATA:  Shortness of breath and chest pain EXAM: CT ANGIOGRAPHY CHEST WITH CONTRAST TECHNIQUE: Multidetector CT imaging of the chest was performed using the standard protocol during bolus administration of intravenous contrast. Multiplanar CT image reconstructions and MIPs were obtained to evaluate the vascular anatomy. CONTRAST:  100 mL Isovue 370 nonionic COMPARISON:  Chest CT April 04, 2014 and chest radiograph February 27, 2016 FINDINGS: Cardiovascular: There is no demonstrable pulmonary embolus. There is dilatation of the ascending thoracic aorta with a maximum measured diameter of 4.8 x 4.5 cm. On the prior study, ascending thoracic aortic diameter measured 4.8 x 4.5 cm as well. There is no thoracic aortic dissection. The visualized great vessels appear normal except for mild atherosclerotic calcification in their proximal aspects. There are foci of coronary  artery calcification evident. There is left ventricular hypertrophy. The pericardium is not appreciably thickened. Mediastinum/Nodes: Thyroid appears unremarkable. There are scattered subcentimeter lymph nodes without adenopathy by size criteria. There is a large paraesophageal hernia with much of the stomach above the diaphragm. There is extensive mediastinal fat within this paraesophageal hernia. Lungs/Pleura: There is chronic bibasilar atelectatic change which is stable compared to the prior study. There is no frank edema or consolidation evident. There are no appreciable pleural effusions. Upper Abdomen: Visualized upper abdominal structures are unremarkable. Musculoskeletal: There are no evident blastic or lytic bone lesions. Review of the MIP images confirms the above findings. IMPRESSION: No demonstrable pulmonary embolus. Ascending thoracic aorta measures 4.8 x 4.5 cm, essentially stable from prior study. Ascending thoracic aortic aneurysm. Recommend semi-annual imaging followup by CTA or MRA and referral to cardiothoracic surgery if not already obtained. This recommendation follows 2010 ACCF/AHA/AATS/ACR/ASA/SCA/SCAI/SIR/STS/SVM Guidelines for the Diagnosis and Management of Patients With Thoracic Aortic Disease. Circulation. 2010; [id number]. No dissection. Scattered foci of atherosclerotic calcification in the aorta and proximal great vessels. Foci of coronary artery calcification noted. Large paraesophageal hernia. Chronic bibasilar atelectasis, stable. No frank edema or consolidation. No evident adenopathy. Electronically Signed   By: Lowella Grip III M.D.   On: 02/27/2016 10:20    EKG: Independently reviewed.  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 Troponin 0 , EKG without evidence of ACS. unrelieved by nitroglycerin,. Received ASA 324 in transit.  CXR unrevealing. CT angio 2015 EF 60 .   Admit to Telemetry/ Observation Chest pain order set Cycle troponins EKG in am continue ASA, O2 and NTG as needed GI cocktail Check Lipid panel   GERD, with hyiatal hernia as above Pepcid IV BID   Leukocytosis, likely reactive unlikely related to underlying infection. WBC 13.4   CXR unrevealing  Afebrile  Check UA   Repeat CBC in AM   Hypertension BP 137/83   Pulse 82   Temp 98.6 F (37 C) (Oral)   Resp 20   Ht 5\' 9"  (1.753 m)   Wt 72.6 kg (160 lb)   SpO2 96%   BMI 23.63 kg/m  Controlled Continue home anti-hypertensive medications    Hyperlipidemia Continue home statins  Prior CVA with residual L sided weakness and dysphagia SLP prior to hear healthy diet  Ascending Aortic Aneurysm,  Currently at 4.8 x 4.5 cm without rupture  COnsider follow by CVTS as outpatient   IBS, continue Bentyl    Major depression Continue meds   DVT prophylaxis: Aggrenox  Code Status:   Full   Family Communication:  Discussed with patient  Disposition Plan: Expect patient to be discharged to home after condition improves Consults called:    None Admission status:Tele  Obs      Rolinda Impson E, PA-C [facility]   If 7PM-7AM, please contact night-coverage [email or web address] Password [email or web address]  02/27/2016, 12:35 PM

## 2016-02-27 NOTE — ED Provider Notes (Signed)
[facility] DEPT Provider Note   CSN: [id number] Arrival date & time: 02/27/16  0606     History   Chief Complaint Chief Complaint  Patient presents with  . Chest Pain    HPI HODARI MULLALLY is a 75 y.o. male who presents with chest pain. PMH significant for a CVA with residual left sided weakness, HTN, HLD, thoracic aortic aneurysm, and large hiatal hernia. He lives at [facility]. He states that he was getting ready for bed last night at about 11PM when he felt a pain in his left shoulder. He went to sleep and woke up with acute, severe, central chest pain. He was given nitro and a antacid with no relief. EMS was called and gave him 324mg  ASA and put him on O2 which eased the pain. Currently he is still have pain. It is central, non-radiating, but less severe. Reports associated diaphoresis when the pain was severe as well as inspiratory chest pain. He has never had this before. Denies previous cardiac or pulmonary hx. Denies hx of DVT/PE. Denies fever, chills, SOB, cough, abdominal pain, N/V. He also endorses right calf pain which is tender to palpation and has been there a "while". He is relatively immobile - able to transfer only. Echo in 2015: EF 60-65%. Never had a cath.  HPI  Past Medical History:  Diagnosis Date  . GERD (gastroesophageal reflux disease)   . Hernia    "large one; on my left stomach" (09/22/2014)  . Hiatal hernia 10/24/2012  . HTN (hypertension) 10/24/2012  . Hyperlipidemia   . Hypertension   . 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 Osteoporosis, unspecified 10/24/2012  . 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 Pneumonia    "once or twice" (09/22/2014)  . Stroke [facility]) 2007   "left side doesn't work now" (09/22/2014)    Patient Active Problem List   Diagnosis Date Noted  . Major depression, chronic 02/16/2016  . Neuropathic pain 02/16/2016  . D (diarrhea) 12/29/2015  . Coronary artery disease involving native coronary artery of native heart with angina pectoris ([place])  11/12/2015  . History of CVA with residual deficit 10/14/2015  . Hyperlipidemia 07/16/2015  . Hypertensive heart disease with congestive heart failure ([place]) 12/10/2014  . Angina pectoris ([place]) 12/10/2014  . COLD (chronic obstructive lung disease) ([place]) 12/10/2014  . Irritable bowel syndrome with diarrhea 12/10/2014  . Senile osteoporosis 12/10/2014  . CVA (cerebral vascular accident) ([place]) 12/10/2014  . Acute on chronic diastolic CHF (congestive heart failure) ([place]) 09/27/2014  . Hypokalemia 09/23/2014  . Pancreatitis, gallstone 09/22/2014  . Chronic diastolic heart failure ([place]) 09/22/2014  . Acute cholangitis 09/22/2014  . Gallstone pancreatitis 09/22/2014  . Esophageal reflux 08/31/2014  . Vitamin D deficiency 08/03/2014  . Hyperlipidemia LDL goal <100 08/03/2014  . CVA, old, hemiparesis ([place]) 08/03/2014  . Leg edema, left 08/03/2014  . Gastroesophageal reflux disease without esophagitis 06/18/2014  . Essential hypertension, benign 06/18/2014  . Primary generalized hypertrophic osteoarthrosis 06/18/2014  . Spastic hemiplegia affecting left nondominant side ([place]) 06/18/2014  . UTI (urinary tract infection) 04/04/2014  . Essential hypertension 04/03/2014  . Chronic airway obstruction, not elsewhere classified 01/15/2014  . Dermatophytosis of nail 12/18/2013  . Unspecified essential hypertension 12/18/2013  . Generalized osteoarthrosis, unspecified site 12/18/2013  . 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 GERD (gastroesophageal reflux disease) 08/11/2013  . Other osteoporosis 08/11/2013  . Chronic pain syndrome 01/30/2013  . IBS (irritable bowel syndrome) 01/30/2013  . CVA (cerebral infarction) 10/24/2012  . HTN (hypertension) 10/24/2012  .  Osteoporosis 10/24/2012  . 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 Other and unspecified hyperlipidemia 10/24/2012  . Hiatal hernia 10/24/2012    Past Surgical History:  Procedure Laterality Date  .  APPENDECTOMY  05/27/2009   Ronnald Collum, MD  . CATARACT EXTRACTION W/ INTRAOCULAR LENS  IMPLANT, BILATERAL Bilateral ?2014  . CHOLECYSTECTOMY N/A 09/26/2014   Procedure: LAPAROSCOPIC CHOLECYSTECTOMY WITH INTRAOPERATIVE CHOLANGIOGRAM;  Surgeon: Donnie Mesa, MD;  Location: [facility];  Service: General;  Laterality: N/A;  . COLONOSCOPY WITH PROPOFOL N/A 12/29/2015   Procedure: COLONOSCOPY WITH PROPOFOL;  Surgeon: Wilford Corner, MD;  Location: [facility] ENDOSCOPY;  Service: Endoscopy;  Laterality: N/A;  . ERCP N/A 09/25/2014   Procedure: ENDOSCOPIC RETROGRADE CHOLANGIOPANCREATOGRAPHY (ERCP);  Surgeon: Clarene Essex, MD;  Location[facility] ENDOSCOPY;  Service: Endoscopy;  Laterality: N/A;       Home Medications    Prior to Admission medications   Medication Sig Start Date End Date Taking? Authorizing Provider  azelastine (ASTELIN) 0.1 % nasal spray Place 1 spray into both nostrils 2 (two) times daily. Use in each nostril as directed   Yes Historical Provider, MD  baclofen (LIORESAL) 10 MG tablet Take one tablet by mouth four times daily for muscle spasm   Yes Historical Provider, MD  calcium carbonate (OS-CAL - DOSED IN MG OF ELEMENTAL CALCIUM) 1250 MG tablet Take 1 tablet by mouth 2 (two) times daily with a meal. 500mg  elemental   Yes Historical Provider, MD  Cholecalciferol 2000 units TABS Take 2,000 Units by mouth daily.   Yes Historical Provider, MD  Dextromethorphan-Quinidine (NUEDEXTA) 20-10 MG CAPS Take one capsule by mouth once daily for PBA   Yes Historical Provider, MD  dicyclomine (BENTYL) 20 MG tablet Take 20 mg by mouth 4 (four) times daily.    Yes Historical Provider, MD  diphenoxylate-atropine (LOMOTIL) 2.5-0.025 MG tablet Take 1 tablet by mouth every 8 (eight) hours as needed for diarrhea or loose stools.    Yes Historical Provider, MD  dipyridamole-aspirin (AGGRENOX) 200-25 MG per 12 hr capsule Take one capsule by mouth twice daily for CVA   Yes Historical Provider, MD  furosemide (LASIX) 20 MG  tablet Take 10 mg by mouth daily.   Yes Historical Provider, MD  gabapentin (NEURONTIN) 100 MG capsule Take 100 mg by mouth 3 (three) times daily.   Yes Historical Provider, MD  metoprolol tartrate (LOPRESSOR) 25 MG tablet Take 0.5 tablets (12.5 mg total) by mouth 2 (two) times daily. 04/06/14  Yes Verlee Monte, MD  nitroGLYCERIN (NITROSTAT) 0.4 MG SL tablet Place 0.4 mg under the tongue every 5 (five) minutes as needed for chest pain.   Yes Historical Provider, MD  oxycodone (OXY-IR) 5 MG capsule Take 5 mg by mouth every 6 (six) hours as needed for pain.    Yes Historical Provider, MD  polyethylene glycol (MIRALAX / GLYCOLAX) packet Take 17 g by mouth daily as needed.    Yes Historical Provider, MD  Prenatal Vit-Fe Fumarate-FA (PREPLUS) 27-1 MG TABS Take 1 tablet by mouth 2 (two) times daily.   Yes Historical Provider, MD  saccharomyces boulardii (FLORASTOR) 250 MG capsule Take 250 mg by mouth daily.    Yes Historical Provider, MD  sertraline (ZOLOFT) 25 MG tablet Take 25 mg by mouth daily.   Yes Historical Provider, MD  simvastatin (ZOCOR) 10 MG tablet Take 10 mg by mouth daily.   Yes Historical Provider, MD  loperamide (IMODIUM) 2 MG capsule Take 2 mg by mouth every 6 (six)  hours as needed for diarrhea or loose stools.     Historical Provider, MD    Family History Family History  Problem Relation Age of Onset  . Heart attack Mother   . Heart attack Father   . Cancer Maternal Aunt     Stomach  . Cancer Paternal Aunt     Uterine,Colon    Social History Social History  Substance Use Topics  . Smoking status: Former Smoker    Packs/day: 0.50    Types: Cigarettes    Quit date: 12/20/2005  . Smokeless tobacco: Never Used  . Alcohol use Yes     Comment: 09/22/2014 "stopped in 2007; drank occasionally when I did drink"     Allergies   Penicillins   Review of Systems Review of Systems  Constitutional: Positive for diaphoresis. Negative for chills and fever.  Respiratory: Negative  for cough and shortness of breath.   Cardiovascular: Positive for chest pain and leg swelling. Negative for palpitations.  Gastrointestinal: Negative for abdominal pain, nausea and vomiting.  All other systems reviewed and are negative.    Physical Exam Updated Vital Signs BP 122/76 (BP Location: Right Arm)   Pulse 89   Temp 98.6 F (37 C) (Oral)   Resp 18   Ht 5\' 9"  (1.753 m)   Wt 72.6 kg   SpO2 96%   BMI 23.63 kg/m   Physical Exam  Constitutional: He is oriented to person, place, and time. He appears well-developed and well-nourished. No distress.  Left side is contracted. On 4L O2  HENT:  Head: Normocephalic and atraumatic.  Eyes: Conjunctivae are normal. Pupils are equal, round, and reactive to light. Right eye exhibits no discharge. Left eye exhibits no discharge. No scleral icterus.  Neck: Normal range of motion. Neck supple.  Cardiovascular: Normal rate and regular rhythm.  Exam reveals no gallop and no friction rub.   No murmur heard. Pulmonary/Chest: Effort normal and breath sounds normal. No respiratory distress. He has no wheezes. He has no rales. He exhibits no tenderness.  Abdominal: Soft. Bowel sounds are normal. He exhibits no distension and no mass. There is no tenderness. There is no rebound and no guarding. No hernia.  Musculoskeletal: He exhibits no edema.  Bilateral foot drop. Right calf tenderness. - Homan's  Neurological: He is alert and oriented to person, place, and time.  Mild dysarthria  Skin: Skin is warm and dry.  Psychiatric: He has a normal mood and affect.  Nursing note and vitals reviewed.    ED Treatments / Results  Labs (all labs ordered are listed, but only abnormal results are displayed) Labs Reviewed  BASIC METABOLIC PANEL - Abnormal; Notable for the following:       Result Value   CO2 21 (*)    Calcium 8.4 (*)    All other components within normal limits  CBC - Abnormal; Notable for the following:    WBC 13.4 (*)    All other  components within normal limits  TROPONIN I  TROPONIN I  TROPONIN I  URINALYSIS, ROUTINE W REFLEX MICROSCOPIC (NOT AT [facility])  I-STAT TROPOININ, ED    EKG  EKG Interpretation  Date/Time:  Sunday February 27 2016 06:10:17 EDT Ventricular Rate:  90 PR Interval:    QRS Duration: 82 QT Interval:  432 QTC Calculation: 529 R Axis:   9 Text Interpretation:  Sinus rhythm Borderline T abnormalities, lateral leads Prolonged QT interval When compared with ECG of 09/22/2014, No significant change was found Confirmed by  Roxanne Mins  MD, [place] ([id number]) on 02/27/2016 6:14:13 AM       Radiology Dg Chest 2 View  Result Date: 02/27/2016 CLINICAL DATA:  Chest pain and shortness of breath EXAM: CHEST  2 VIEW COMPARISON:  Sep 26, 2014 FINDINGS: There is a large hiatal hernia. There is scarring in the lung bases. There is no frank edema or consolidation. There is mild atelectasis in the left lower lobe region, chronic. Heart is upper normal in size with pulmonary vascularity within normal limits. There is atherosclerotic calcification in the aorta. Bones are osteoporotic. No adenopathy evident. IMPRESSION: Large hiatal hernia. Left lower lobe atelectasis with mild bibasilar scarring. Stable cardiac silhouette. There is aortic atherosclerosis. Bones appear osteoporotic. Electronically Signed   By: Lowella Grip III M.D.   On: 02/27/2016 08:18   Ct Angio Chest Pe W/cm &/or Wo Cm  Result Date: 02/27/2016 CLINICAL DATA:  Shortness of breath and chest pain EXAM: CT ANGIOGRAPHY CHEST WITH CONTRAST TECHNIQUE: Multidetector CT imaging of the chest was performed using the standard protocol during bolus administration of intravenous contrast. Multiplanar CT image reconstructions and MIPs were obtained to evaluate the vascular anatomy. CONTRAST:  100 mL Isovue 370 nonionic COMPARISON:  Chest CT April 04, 2014 and chest radiograph February 27, 2016 FINDINGS: Cardiovascular: There is no demonstrable pulmonary embolus. There is  dilatation of the ascending thoracic aorta with a maximum measured diameter of 4.8 x 4.5 cm. On the prior study, ascending thoracic aortic diameter measured 4.8 x 4.5 cm as well. There is no thoracic aortic dissection. The visualized great vessels appear normal except for mild atherosclerotic calcification in their proximal aspects. There are foci of coronary artery calcification evident. There is left ventricular hypertrophy. The pericardium is not appreciably thickened. Mediastinum/Nodes: Thyroid appears unremarkable. There are scattered subcentimeter lymph nodes without adenopathy by size criteria. There is a large paraesophageal hernia with much of the stomach above the diaphragm. There is extensive mediastinal fat within this paraesophageal hernia. Lungs/Pleura: There is chronic bibasilar atelectatic change which is stable compared to the prior study. There is no frank edema or consolidation evident. There are no appreciable pleural effusions. Upper Abdomen: Visualized upper abdominal structures are unremarkable. Musculoskeletal: There are no evident blastic or lytic bone lesions. Review of the MIP images confirms the above findings. IMPRESSION: No demonstrable pulmonary embolus. Ascending thoracic aorta measures 4.8 x 4.5 cm, essentially stable from prior study. Ascending thoracic aortic aneurysm. Recommend semi-annual imaging followup by CTA or MRA and referral to cardiothoracic surgery if not already obtained. This recommendation follows 2010 ACCF/AHA/AATS/ACR/ASA/SCA/SCAI/SIR/STS/SVM Guidelines for the Diagnosis and Management of Patients With Thoracic Aortic Disease. Circulation. 2010; [id number]. No dissection. Scattered foci of atherosclerotic calcification in the aorta and proximal great vessels. Foci of coronary artery calcification noted. Large paraesophageal hernia. Chronic bibasilar atelectasis, stable. No frank edema or consolidation. No evident adenopathy. Electronically Signed   By: Lowella Grip III M.D.   On: 02/27/2016 10:20    Procedures Procedures (including critical care time)  Medications Ordered in ED Medications  simvastatin (ZOCOR) tablet 10 mg (not administered)  furosemide (LASIX) tablet 10 mg (not administered)  dicyclomine (BENTYL) tablet 20 mg (not administered)  sertraline (ZOLOFT) tablet 25 mg (not administered)  oxycodone (OXY-IR) immediate release capsule 5 mg (not administered)  metoprolol tartrate (LOPRESSOR) tablet 12.5 mg (not administered)  gabapentin (NEURONTIN) capsule 100 mg (not administered)  baclofen (LIORESAL) tablet 10 mg (not administered)  Dextromethorphan-Quinidine 20-10 MG CAPS 1 capsule (not administered)  dipyridamole-aspirin (AGGRENOX) 200-25  MG per 12 hr capsule 1 capsule (not administered)  0.9 %  sodium chloride infusion (not administered)  HYDROcodone-acetaminophen (NORCO/VICODIN) 5-325 MG per tablet 1-2 tablet (not administered)  senna-docusate (Senokot-S) tablet 1 tablet (not administered)  bisacodyl (DULCOLAX) suppository 10 mg (not administered)  magnesium citrate solution 1 Bottle (not administered)  sodium chloride flush (NS) 0.9 % injection 3 mL (not administered)  promethazine (PHENERGAN) tablet 12.5 mg (not administered)  morphine 2 MG/ML injection 2 mg (not administered)  gi cocktail (Maalox,Lidocaine,Donnatal) (not administered)  acetaminophen (TYLENOL) tablet 650 mg (not administered)  ondansetron (ZOFRAN) injection 4 mg (not administered)  famotidine (PEPCID) IVPB 20 mg premix (not administered)  iopamidol (ISOVUE-370) 76 % injection (100 mLs  Contrast Given 02/27/16 0950)  morphine 4 MG/ML injection 4 mg (4 mg Intravenous Given 02/27/16 1059)  gi cocktail (Maalox,Lidocaine,Donnatal) (30 mLs Oral Given 02/27/16 1203)     Initial Impression / Assessment and Plan / ED Course  I have reviewed the triage vital signs and the nursing notes.  Pertinent labs & imaging results that were available during my care of the  patient were reviewed by me and considered in my medical decision making (see chart for details).  Clinical Course   75 year old male presents with chest pain. Patient is afebrile, not tachycardic or tachypneic, normotensive, and not hypoxic. He is having constant pain which has been worsening over the course of his ED stay. EKG is NSR and shows no significant change since last. CXR is remarkable for large hiatal hernia but otherwise is negative. Troponin is 0. Labs are remarkable for leukocytosis of 13.4. HEART score is 5. Due to immobility and calf tenderness with new chest pain CTA obtained which was overall unremarkable. Thoracic aneurysm is stable. Dose of morphine given for pain. GI cocktail given.   Due to risk factors will have patient admitted for chest pain observation. Spoke with Sherrilyn Rist who will come see patient. Appreciate assistance  Final Clinical Impressions(s) / ED Diagnoses   Final diagnoses:  Chest pain, unspecified type    New Prescriptions New Prescriptions   No medications on file     Recardo Evangelist, PA-C 02/27/16 [place], MD 03/03/16 1750

## 2016-02-27 NOTE — ED Triage Notes (Signed)
Pt arrives to D32 at this time.  Per EMS report pt received 324 mg ASA, 4 sublingual NTG PTA.  Per EMS pt received 5 mg Oxycodone at 0315.  Pt reports substernal chest pain rated 3/10 at this time.  Pt reports that his symptoms started last night at 2300.   Chief Complaint  Patient presents with  . Chest Pain   Past Medical History:  Diagnosis Date  . GERD (gastroesophageal reflux disease)   . Hernia    "large one; on my left stomach" (09/22/2014)  . Hiatal hernia 10/24/2012  . HTN (hypertension) 10/24/2012  . Hyperlipidemia   . Hypertension   . 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 Osteoporosis, unspecified 10/24/2012  . 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 Pneumonia    "once or twice" (09/22/2014)  . Stroke [facility]) 2007   "left side doesn't work now" (09/22/2014)

## 2016-02-27 NOTE — Progress Notes (Signed)
This RN was notified by primary RN that pt needed a RN stroke swallow screen. Informed RN that pt has a history of a cva and is not currently admitted for a neurological event. Also informed RN that an order for a speech therapy bedside swallow eval will need to be ordered instead.

## 2016-02-28 DIAGNOSIS — I639 Cerebral infarction, unspecified: Secondary | ICD-10-CM | POA: Diagnosis not present

## 2016-02-28 DIAGNOSIS — K219 Gastro-esophageal reflux disease without esophagitis: Secondary | ICD-10-CM | POA: Diagnosis not present

## 2016-02-28 DIAGNOSIS — R079 Chest pain, unspecified: Secondary | ICD-10-CM | POA: Diagnosis not present

## 2016-02-28 LAB — COMPREHENSIVE METABOLIC PANEL WITH GFR
ALT: 25 U/L (ref 17–63)
AST: 26 U/L (ref 15–41)
Albumin: 2.9 g/dL — ABNORMAL LOW (ref 3.5–5.0)
Alkaline Phosphatase: 80 U/L (ref 38–126)
Anion gap: 8 (ref 5–15)
BUN: 14 mg/dL (ref 6–20)
CO2: 23 mmol/L (ref 22–32)
Calcium: 8 mg/dL — ABNORMAL LOW (ref 8.9–10.3)
Chloride: 107 mmol/L (ref 101–111)
Creatinine, Ser: 0.84 mg/dL (ref 0.61–1.24)
GFR calc Af Amer: >60 mL/min (ref >60)
GFR calc non Af Amer: >60 mL/min (ref >60)
Glucose, Bld: 106 mg/dL — ABNORMAL HIGH (ref 65–99)
Potassium: 4 mmol/L (ref 3.5–5.1)
Sodium: 138 mmol/L (ref 135–145)
Total Bilirubin: 0.4 mg/dL (ref 0.3–1.2)
Total Protein: 6 g/dL — ABNORMAL LOW (ref 6.5–8.1)

## 2016-02-28 LAB — LIPID PANEL
CHOL/HDL RATIO: 3.1 ratio
Cholesterol: 130 mg/dL (ref 0–200)
HDL: 42 mg/dL (ref >40)
LDL CALC: 79 mg/dL (ref 0–99)
TRIGLYCERIDES: 46 mg/dL (ref <150)
VLDL: 9 mg/dL (ref 0–40)

## 2016-02-28 LAB — CBC
HCT: 41.7 % (ref 39.0–52.0)
Hemoglobin: 13.6 g/dL (ref 13.0–17.0)
MCH: 29.6 pg (ref 26.0–34.0)
MCHC: 32.6 g/dL (ref 30.0–36.0)
MCV: 90.7 fL (ref 78.0–100.0)
Platelets: 140 K/uL — ABNORMAL LOW (ref 150–400)
RBC: 4.6 MIL/uL (ref 4.22–5.81)
RDW: 14.3 % (ref 11.5–15.5)
WBC: 7.7 K/uL (ref 4.0–10.5)

## 2016-02-28 MED ORDER — GI COCKTAIL ~~LOC~~: 30.0000 mL | Freq: Four times a day (QID) | ORAL | Status: DC | PRN

## 2016-02-28 MED ORDER — ACETAMINOPHEN 325 MG PO TABS: 650.0000 mg | ORAL_TABLET | ORAL | Status: DC | PRN

## 2016-02-28 MED ORDER — PANTOPRAZOLE SODIUM 40 MG PO TBEC: 40.0000 mg | DELAYED_RELEASE_TABLET | Freq: Two times a day (BID) | ORAL | Status: DC

## 2016-02-28 MED ORDER — OXYCODONE HCL 5 MG PO CAPS: 5.0000 mg | ORAL_CAPSULE | Freq: Four times a day (QID) | ORAL | 0 refills | Status: DC | PRN

## 2016-02-28 NOTE — Progress Notes (Signed)
Report called to Hiram Comber at [facility]

## 2016-02-28 NOTE — Discharge Summary (Signed)
Physician Discharge Summary  Gregory Kerr [id number] DOB: 10-Dec-1940 DOA: 02/27/2016  PCP: Blanchie Serve, MD  Admit date: 02/27/2016 Discharge date: 02/28/2016   Recommendations for Outpatient Follow-Up:   1. Outpatient GI follow up 2. 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  Diet recommendation: Low sodium, heart healthy- GERD  Wound care: None.   History of Present Illness:   Gregory Kerr is a 75 y.o. male with a Past Medical History of old cva w/ left sided paresis and dysphagia (pt states on regular diet), gerd not on trx, chronic diastolic chf, ibs, thoracic AAA  who presents with chest pain. Not resolved w/ ntg x 3.  After gi cocktail in ED, "chest pain resolved".    Hospital Course by Problem:   Chest pain syndrome, appears to be GERD - Troponin 0 , EKG without evidence of ACS. unrelieved by nitroglycerin,. Received ASA 324 in transit.  CXR unrevealing. -needs outpatient GI/surgery follow up if protonix does not relieve pain  GERD, with esophageal hernia  -protonix BID  Leukocytosis, likely reactive unlikely related to underlying infection. WBC 13.4   CXR unrevealing  Afebrile      Medical Consultants:    None.   Discharge Exam:   Vitals:   02/27/16 2033 02/28/16 0545  BP: 122/62 119/61  Pulse: 70 66  Resp: 18 18  Temp: 98.2 F (36.8 C) 98.2 F (36.8 C)   Vitals:   02/27/16 1300 02/27/16 1448 02/27/16 2033 02/28/16 0545  BP: 143/99 (!) 148/73  122/62 119/61  Pulse: 89 88 70 66  Resp: 19  18 18   Temp:  98.6 F (37 C) 98.2 F (36.8 C) 98.2 F (36.8 C)  TempSrc:  Oral Oral Oral  SpO2: 98% 93% 96% 94%  Weight:    73.3 kg (161 lb 11.2 oz)  Height:        Gen:  NAD Family update on phone   The results of significant diagnostics from this hospitalization (including imaging, microbiology, ancillary and laboratory) are listed below for reference.     Procedures and Diagnostic Studies:   Dg Chest 2 View  Result Date: 02/27/2016 CLINICAL DATA:  Chest pain and shortness of breath EXAM: CHEST  2 VIEW COMPARISON:  Sep 26, 2014 FINDINGS: There is a large hiatal hernia. There is scarring in the lung bases. There is no frank edema or consolidation. There is mild atelectasis in the left lower lobe region, chronic. Heart is upper normal in size with pulmonary vascularity within normal limits. There is atherosclerotic calcification in the aorta. Bones are osteoporotic. No adenopathy evident. IMPRESSION: Large hiatal hernia. Left lower lobe atelectasis with mild bibasilar scarring. Stable cardiac silhouette. There is aortic atherosclerosis. Bones appear osteoporotic. Electronically Signed   By: Lowella Grip III M.D.   On: 02/27/2016 08:18   Ct Angio Chest Pe W/cm &/or Wo Cm  Result Date: 02/27/2016 CLINICAL DATA:  Shortness of breath and chest pain EXAM: CT ANGIOGRAPHY CHEST WITH CONTRAST TECHNIQUE: Multidetector CT imaging of the chest  was performed using the standard protocol during bolus administration of intravenous contrast. Multiplanar CT image reconstructions and MIPs were obtained to evaluate the vascular anatomy. CONTRAST:  100 mL Isovue 370 nonionic COMPARISON:  Chest CT April 04, 2014 and chest radiograph February 27, 2016 FINDINGS: Cardiovascular: There is no demonstrable pulmonary embolus. There is dilatation of the ascending thoracic aorta with a maximum measured diameter of 4.8 x 4.5 cm. On the prior study, ascending thoracic  aortic diameter measured 4.8 x 4.5 cm as well. There is no thoracic aortic dissection. The visualized great vessels appear normal except for mild atherosclerotic calcification in their proximal aspects. There are foci of coronary artery calcification evident. There is left ventricular hypertrophy. The pericardium is not appreciably thickened. Mediastinum/Nodes: Thyroid appears unremarkable. There are scattered subcentimeter lymph nodes without adenopathy by size criteria. There is a large paraesophageal hernia with much of the stomach above the diaphragm. There is extensive mediastinal fat within this paraesophageal hernia. Lungs/Pleura: There is chronic bibasilar atelectatic change which is stable compared to the prior study. There is no frank edema or consolidation evident. There are no appreciable pleural effusions. Upper Abdomen: Visualized upper abdominal structures are unremarkable. Musculoskeletal: There are no evident blastic or lytic bone lesions. Review of the MIP images confirms the above findings. IMPRESSION: No demonstrable pulmonary embolus. Ascending thoracic aorta measures 4.8 x 4.5 cm, essentially stable from prior study. Ascending thoracic aortic aneurysm. Recommend semi-annual imaging followup by CTA or MRA and referral to cardiothoracic surgery if not already obtained. This recommendation follows 2010 ACCF/AHA/AATS/ACR/ASA/SCA/SCAI/SIR/STS/SVM Guidelines for the Diagnosis and Management of Patients With Thoracic Aortic Disease. Circulation. 2010; [id number]. No dissection. Scattered foci of atherosclerotic calcification in the aorta and proximal great vessels. Foci of coronary artery calcification noted. Large paraesophageal hernia. Chronic bibasilar atelectasis, stable. No frank edema or consolidation. No evident adenopathy. Electronically Signed   By: Lowella Grip III M.D.   On: 02/27/2016 10:20     Labs:   Basic Metabolic Panel:  Recent Labs Lab 02/27/16 0646 02/28/16 0152    NA 139 138  K 4.0 4.0  CL 106 107  CO2 21* 23  GLUCOSE 97 106*  BUN 18 14  CREATININE 0.87 0.84  CALCIUM 8.4* 8.0*   GFR Estimated Creatinine Clearance: 79.5 mL/min (by C-G formula based on SCr of 0.84 mg/dL). Liver Function Tests:  Recent Labs Lab 02/28/16 0152  AST 26  ALT 25  ALKPHOS 80  BILITOT 0.4  PROT 6.0*  ALBUMIN 2.9*   No results for input(s): LIPASE, AMYLASE in the last 168 hours. No results for input(s): AMMONIA in the last 168 hours. Coagulation profile No results for input(s): INR, PROTIME in the last 168 hours.  CBC:  Recent Labs Lab 02/27/16 0646 02/28/16 0152  WBC 13.4* 7.7  HGB 14.4 13.6  HCT 44.2 41.7  MCV 90.4 90.7  PLT 173 140*   Cardiac Enzymes:  Recent Labs Lab 02/27/16 1407 02/27/16 1814  TROPONINI <0.03 <0.03   BNP: Invalid input(s): POCBNP CBG: No results for input(s): GLUCAP in the last 168 hours. D-Dimer No results for input(s): DDIMER in the last 72 hours. Hgb A1c No results for input(s): HGBA1C in the last 72 hours. Lipid Profile  Recent Labs  02/28/16 0152  CHOL 130  HDL 42  LDLCALC 79  TRIG 46  CHOLHDL 3.1   Thyroid function studies No results for input(s): TSH, T4TOTAL, T3FREE, THYROIDAB in the last 72 hours.  Invalid input(s): FREET3 Anemia work up No results for  input(s): VITAMINB12, FOLATE, FERRITIN, TIBC, IRON, RETICCTPCT in the last 72 hours. Microbiology Recent Results (from the past 240 hour(s))  MRSA PCR Screening     Status: None   Collection Time: 02/27/16  7:09 PM  Result Value Ref Range Status   MRSA by PCR NEGATIVE NEGATIVE Final    Comment:        The GeneXpert MRSA Assay (FDA approved for NASAL specimens only), is one component of a comprehensive MRSA colonization surveillance program. It is not intended to diagnose MRSA infection nor to guide or monitor treatment for MRSA infections.      Discharge Instructions:   Discharge Instructions    Diet - low sodium heart healthy     Complete by:  As directed    GERD   Discharge instructions    Complete by:  As directed    For thoracic aneurysm: semi-annual imaging followup by CTA or MRA and referral to cardiothoracic surgery Will need GI referral and possible surgical referral for Large paraesophageal hernia-- PPI added   Increase activity slowly    Complete by:  As directed        Medication List    STOP taking these medications   loperamide 2 MG capsule Commonly known as:  IMODIUM     TAKE these medications   acetaminophen 325 MG tablet Commonly known as:  TYLENOL Take 2 tablets (650 mg total) by mouth every 4 (four) hours as needed for headache or mild pain.   azelastine 0.1 % nasal spray Commonly known as:  ASTELIN Place 1 spray into both nostrils 2 (two) times daily. Use in each nostril as directed   baclofen 10 MG tablet Commonly known as:  LIORESAL Take one tablet by mouth four times daily for muscle spasm   calcium carbonate 1250 (500 Ca) MG tablet Commonly known as:  OS-CAL - dosed in mg of elemental calcium Take 1 tablet by mouth 2 (two) times daily with a meal. 500mg  elemental   Cholecalciferol 2000 units Tabs Take 2,000 Units by mouth daily.   dicyclomine 20 MG tablet Commonly known as:  BENTYL Take 20 mg by mouth 4 (four) times daily.   diphenoxylate-atropine 2.5-0.025 MG tablet Commonly known as:  LOMOTIL Take 1 tablet by mouth every 8 (eight) hours as needed for diarrhea or loose stools.   dipyridamole-aspirin 200-25 MG 12hr capsule Commonly known as:  AGGRENOX Take one capsule by mouth twice daily for CVA   furosemide 20 MG tablet Commonly known as:  LASIX Take 10 mg by mouth daily.   gabapentin 100 MG capsule Commonly known as:  NEURONTIN Take 100 mg by mouth 3 (three) times daily.   gi cocktail Susp suspension Take 30 mLs by mouth 4 (four) times daily as needed for indigestion (or chest pain). Shake well.   metoprolol tartrate 25 MG tablet Commonly known as:   LOPRESSOR Take 0.5 tablets (12.5 mg total) by mouth 2 (two) times daily.   nitroGLYCERIN 0.4 MG SL tablet Commonly known as:  NITROSTAT Place 0.4 mg under the tongue every 5 (five) minutes as needed for chest pain.   NUEDEXTA 20-10 MG Caps Generic drug:  Dextromethorphan-Quinidine Take one capsule by mouth once daily for PBA   oxycodone 5 MG capsule Commonly known as:  OXY-IR Take 1 capsule (5 mg total) by mouth every 6 (six) hours as needed for pain.   pantoprazole 40 MG tablet Commonly known as:  PROTONIX Take 1 tablet (40 mg total) by mouth 2 (two) times  daily before a meal.   polyethylene glycol packet Commonly known as:  MIRALAX / GLYCOLAX Take 17 g by mouth daily as needed.   PREPLUS 27-1 MG Tabs Take 1 tablet by mouth 2 (two) times daily.   saccharomyces boulardii 250 MG capsule Commonly known as:  FLORASTOR Take 250 mg by mouth daily.   sertraline 25 MG tablet Commonly known as:  ZOLOFT Take 25 mg by mouth daily.   simvastatin 10 MG tablet Commonly known as:  ZOCOR Take 10 mg by mouth daily.      Follow-up Information    PANDEY, MAHIMA, MD Follow up in 1 week(s).   Specialty:  Internal Medicine Why:  needs  GI Referral and possible surgical referral if acid reflux symptoms not controlled with protonix needs CT surgery referral as well Contact information: [place] [address] [phone]            Time coordinating discharge: 35 min  Signed:  Datron Brakebill Alison Stalling   [facility] 02/28/2016, 10:52 AM

## 2016-02-28 NOTE — Clinical Social Work Note (Addendum)
Clinical Social Work Assessment  Patient Details  Name: Gregory Kerr MRN: [id number] Date of Birth: December 20, 1940  Date of referral:  02/28/16               Reason for consult:  Facility Placement                Permission sought to share information with:  Family Supports Permission granted to share information::  Yes, Verbal Permission Granted  Name::     [name] [name]  Agency::     Relationship::  Sister   Contact Information:  [phone]  Housing/Transportation Living arrangements for the past 2 months:  [place] of Information:  Patient Patient Interpreter Needed:  None Criminal Activity/Legal Involvement Pertinent to Current Situation/Hospitalization:  No - Comment as needed Significant Relationships:  Siblings Lives with:  Self Do you feel safe going back to the place where you live?  Yes Need for family participation in patient care:  No (Coment) (Long term SNF: [facility])  Care giving concerns:  No family or friends at bedside at this time. Pt gave verbal consent to reach out to pt's sister if needed.    Social Worker assessment / plan:  CSW met with pt at bedside. Pt was at [facility] (long term) prior to admission. Pt plans to return to [facility] (long term) following discharge. Pt agrees to transport by [facility]. Pt has no concerns at this time. CSW will continue facility discharge needs.   Employment status:  Retired Forensic scientist:   [employer] PT Recommendations:  [place] / Referral to community resources:  [place]  Patient/Family's Response to care: Pt verbalizes understand of CSW role and appreciative of support. Pt is familiar with SNF placement and in agreeable to return to [facility].   Patient/Family's Understanding of and Emotional Response to Diagnosis, Current Treatment, and Prognosis:  Pt is realistic concerning physical limitations. Pt does not express  any concerns at this time.   Emotional Assessment Appearance:  Appears stated age Attitude/Demeanor/Rapport:   (Patient was appropriate) Affect (typically observed):  Accepting, Appropriate, Pleasant Orientation:  Oriented to Self, Oriented to Place, Oriented to  Time, Oriented to Situation Alcohol / Substance use:  Not Applicable Psych involvement (Current and /or in the community):  No (Comment)  Discharge Needs  Concerns to be addressed:  No discharge needs identified Readmission within the last 30 days:  No Current discharge risk:  Dependent with Mobility Barriers to Discharge:  Continued Medical Work up   [employer], [place]

## 2016-02-28 NOTE — Progress Notes (Signed)
Order received to discharge patient.  Patient expresses readiness to discharge.  Telemetry monitor was removed and CCMD notified.  PIV access removed without difficulty.

## 2016-02-28 NOTE — NC FL2 (Signed)
[place] LEVEL OF CARE SCREENING TOOL     IDENTIFICATION  Patient Name: Gregory Kerr Birthdate: 1940-12-09 Sex: male Admission Date (Current Location): 02/27/2016  [facility] and [place] Number:  Herbalist and Address:  [facility] [place], [place] [address], [place], [place] [address]      Provider Number: [id number]  Attending Physician Name and Address:  Geradine Girt, DO  Relative Name and Phone Number:       Current Level of Care: Hospital Recommended Level of Care: [place] Prior Approval Number:    Date Approved/Denied:   PASRR Number:  [id number]   Discharge Plan: SNF    Current Diagnoses: Patient Active Problem List   Diagnosis Date Noted  . Chest pain 02/27/2016  . Thoracic ascending aortic aneurysm ([place]) 02/27/2016  . Leukocytosis 02/27/2016  . Major depression, chronic 02/16/2016  . Neuropathic pain 02/16/2016  . D (diarrhea) 12/29/2015  . Coronary artery disease involving native coronary artery of native heart with angina pectoris ([place]) 11/12/2015  . History of CVA with residual deficit 10/14/2015  . Hyperlipidemia 07/16/2015  . Hypertensive heart disease with congestive heart failure ([place]) 12/10/2014  . Angina pectoris ([place]) 12/10/2014  . Chronic obstructive pulmonary disease ([place]) 12/10/2014  . Irritable bowel syndrome with diarrhea 12/10/2014  . Senile osteoporosis 12/10/2014  . CVA (cerebral vascular accident) ([place]) 12/10/2014  . Acute on chronic diastolic CHF (congestive heart failure) ([place]) 09/27/2014  . Hypokalemia 09/23/2014  . Pancreatitis, gallstone 09/22/2014  . Chronic diastolic heart failure ([place]) 09/22/2014  . Acute cholangitis 09/22/2014  . Gallstone pancreatitis 09/22/2014  . Gastroesophageal reflux disease 08/31/2014  . Vitamin D deficiency 08/03/2014  . Hyperlipidemia LDL goal <100 08/03/2014  . CVA, old, hemiparesis ([place]) 08/03/2014  . Leg edema, left 08/03/2014  .  Gastroesophageal reflux disease without esophagitis 06/18/2014  . Essential hypertension, benign 06/18/2014  . Primary generalized hypertrophic osteoarthrosis 06/18/2014  . Spastic hemiplegia affecting left nondominant side ([place]) 06/18/2014  . UTI (urinary tract infection) 04/04/2014  . Essential hypertension 04/03/2014  . Chronic airway obstruction, not elsewhere classified 01/15/2014  . Dermatophytosis of nail 12/18/2013  . Unspecified essential hypertension 12/18/2013  . Generalized osteoarthrosis, unspecified site 12/18/2013  . 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 GERD (gastroesophageal reflux disease) 08/11/2013  . Other osteoporosis 08/11/2013  . Chronic pain syndrome 01/30/2013  . IBS (irritable bowel syndrome) 01/30/2013  . Cerebral infarct ([place]) 10/24/2012  . HTN (hypertension) 10/24/2012  . Osteoporosis 10/24/2012  . 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 Other and unspecified hyperlipidemia 10/24/2012  . Hiatal hernia 10/24/2012    Orientation RESPIRATION BLADDER Height & Weight     Self, Time, Situation, Place  O2 (Nasal Cannula 2L) Continent Weight: 161 lb 11.2 oz (73.3 kg) Height:  5\' 9"  (175.3 cm)  BEHAVIORAL SYMPTOMS/MOOD NEUROLOGICAL BOWEL NUTRITION STATUS      Continent Diet (Heart Healthy: thin liquids)  AMBULATORY STATUS COMMUNICATION OF NEEDS Skin   Limited Assist Verbally Normal                       Personal Care Assistance Level of Assistance  Bathing, Feeding, Dressing Bathing Assistance: Limited assistance Feeding assistance: Independent Dressing Assistance: Limited assistance     Functional Limitations Info  Sight, Hearing, Speech Sight Info: Adequate Hearing Info: Adequate Speech Info: Adequate    SPECIAL CARE FACTORS FREQUENCY  PT (By licensed PT), OT (By licensed OT)  PT Frequency:  (Not assessed at this time) OT Frequency:  (Not assessed at this time)            Contractures Contractures  Info: Not present    Additional Factors Info  Code Status, Allergies Code Status Info: Full Allergies Info: Penicillins           Current Medications (02/28/2016):  This is the current hospital active medication list Current Facility-Administered Medications  Medication Dose Route Frequency Provider Last Rate Last Dose  . 0.9 %  sodium chloride infusion   Intravenous Continuous Rondel Jumbo, PA-C 50 mL/hr at 02/27/16 1824    . acetaminophen (TYLENOL) tablet 650 mg  650 mg Oral Q4H PRN Rondel Jumbo, PA-C      . baclofen (LIORESAL) tablet 10 mg  10 mg Oral QID Rondel Jumbo, PA-C   10 mg at 02/28/16 1043  . bisacodyl (DULCOLAX) suppository 10 mg  10 mg Rectal Daily PRN Rondel Jumbo, PA-C      . dicyclomine (BENTYL) tablet 20 mg  20 mg Oral QID Rondel Jumbo, PA-C   20 mg at 02/28/16 1044  . dipyridamole-aspirin (AGGRENOX) 200-25 MG per 12 hr capsule 1 capsule  1 capsule Oral Q12H Rondel Jumbo, PA-C   1 capsule at 02/28/16 1045  . famotidine (PEPCID) IVPB 20 mg premix  20 mg Intravenous Q12H Rondel Jumbo, PA-C   20 mg at 02/28/16 1052  . furosemide (LASIX) tablet 10 mg  10 mg Oral Daily Rondel Jumbo, PA-C   10 mg at 02/28/16 1045  . gabapentin (NEURONTIN) capsule 100 mg  100 mg Oral TID Rondel Jumbo, PA-C   100 mg at 02/28/16 1044  . gi cocktail (Maalox,Lidocaine,Donnatal)  30 mL Oral QID PRN Rondel Jumbo, PA-C   30 mL at 02/27/16 2045  . HYDROcodone-acetaminophen (NORCO/VICODIN) 5-325 MG per tablet 1-2 tablet  1-2 tablet Oral Q4H PRN Rondel Jumbo, PA-C      . magnesium citrate solution 1 Bottle  1 Bottle Oral Once PRN Rondel Jumbo, PA-C      . MEDLINE mouth rinse  15 mL Mouth Rinse BID Maren Reamer, MD   15 mL at 02/27/16 2200  . metoprolol tartrate (LOPRESSOR) tablet 12.5 mg  12.5 mg Oral BID Rondel Jumbo, PA-C   12.5 mg at 02/28/16 1044  . morphine 2 MG/ML injection 2 mg  2 mg Intravenous Q2H PRN Rondel Jumbo, PA-C      . ondansetron [facility]) injection 4 mg   4 mg Intravenous Q6H PRN Rondel Jumbo, PA-C      . oxyCODONE (Oxy IR/ROXICODONE) immediate release tablet 5 mg  5 mg Oral Q6H PRN Rondel Jumbo, PA-C      . promethazine (PHENERGAN) tablet 12.5 mg  12.5 mg Oral Q6H PRN Rondel Jumbo, PA-C      . senna-docusate (Senokot-S) tablet 1 tablet  1 tablet Oral QHS PRN Rondel Jumbo, PA-C      . sertraline (ZOLOFT) tablet 25 mg  25 mg Oral Daily Rondel Jumbo, PA-C   25 mg at 02/28/16 1044  . simvastatin (ZOCOR) tablet 10 mg  10 mg Oral Daily Rondel Jumbo, PA-C   10 mg at 02/28/16 1044  . sodium chloride flush (NS) 0.9 % injection 3 mL  3 mL Intravenous Q12H Rondel Jumbo, PA-C   3 mL at 02/27/16 2224     Discharge Medications: Please see discharge summary  for a list of discharge medications.  Relevant Imaging Results:  Relevant Lab Results:   Additional Information SSN: [id number]  Alla German, LCSW

## 2016-02-29 ENCOUNTER — Other Ambulatory Visit: Payer: Self-pay | Admitting: *Deleted

## 2016-02-29 MED ORDER — OXYCODONE HCL 5 MG PO CAPS: ORAL_CAPSULE | ORAL | 0 refills | Status: DC

## 2016-02-29 NOTE — Telephone Encounter (Signed)
[facility] [phone] Fax: [phone]  

## 2016-03-02 ENCOUNTER — Encounter: Payer: Self-pay | Admitting: Internal Medicine

## 2016-03-02 ENCOUNTER — Non-Acute Institutional Stay (SKILLED_NURSING_FACILITY): Payer: Medicare Other | Admitting: Internal Medicine

## 2016-03-02 DIAGNOSIS — I10 Essential (primary) hypertension: Secondary | ICD-10-CM

## 2016-03-02 DIAGNOSIS — I712 Thoracic aortic aneurysm, without rupture, unspecified: Secondary | ICD-10-CM

## 2016-03-02 DIAGNOSIS — E784 Other hyperlipidemia: Secondary | ICD-10-CM

## 2016-03-02 DIAGNOSIS — I5032 Chronic diastolic (congestive) heart failure: Secondary | ICD-10-CM | POA: Diagnosis not present

## 2016-03-02 DIAGNOSIS — K219 Gastro-esophageal reflux disease without esophagitis: Secondary | ICD-10-CM | POA: Diagnosis not present

## 2016-03-02 DIAGNOSIS — M818 Other osteoporosis without current pathological fracture: Secondary | ICD-10-CM

## 2016-03-02 DIAGNOSIS — Z8673 Personal history of transient ischemic attack (TIA), and cerebral infarction without residual deficits: Secondary | ICD-10-CM

## 2016-03-02 DIAGNOSIS — F482 Pseudobulbar affect: Secondary | ICD-10-CM

## 2016-03-02 DIAGNOSIS — E7849 Other hyperlipidemia: Secondary | ICD-10-CM

## 2016-03-02 DIAGNOSIS — K582 Mixed irritable bowel syndrome: Secondary | ICD-10-CM

## 2016-03-02 DIAGNOSIS — G8112 Spastic hemiplegia affecting left dominant side: Secondary | ICD-10-CM | POA: Diagnosis not present

## 2016-03-02 DIAGNOSIS — K449 Diaphragmatic hernia without obstruction or gangrene: Secondary | ICD-10-CM | POA: Diagnosis not present

## 2016-03-02 DIAGNOSIS — G8114 Spastic hemiplegia affecting left nondominant side: Secondary | ICD-10-CM

## 2016-03-02 NOTE — Progress Notes (Signed)
LOCATION: [facility]  PCP: Blanchie Serve, MD   Code Status: Full Code  Goals of care: Advanced Directive information Advanced Directives 02/27/2016  Does patient have an advance directive? Yes  Type of Advance Directive [place]  Does patient want to make changes to advanced directive? No - Patient declined  Copy of advanced directive(s) in chart? No - copy requested  Would patient like information on creating an advanced directive? No - patient declined information       Extended Emergency Contact Information Primary Emergency Contact: [name],[name]          Saenz Spaniel [place] Phone: [phone] Mobile Phone: [phone] Relation: Sister   Allergies  Allergen Reactions  . Penicillins Rash    Chief Complaint  Patient presents with  . Readmit To SNF    Readmision Visit     HPI:  Patient is a 75 y.o. male seen today for long term care post hospital admission from 02/27/16-02/28/16 with chest pain that resolved with GI cocktail in the ED. This was thought to be from GERD with hiatal hernia. His cyclic troponin was negative.   Review of Systems:  Constitutional: Negative for fever, chills, diaphoresis.  HENT: Negative for headache, congestion, nasal discharge Eyes: Negative for double vision and discharge.  Respiratory: Negative for cough, shortness of breath and wheezing.   Cardiovascular: Negative for chest pain, palpitations, leg swelling.  Gastrointestinal: Negative for heartburn, nausea, vomiting, abdominal pain. had a bowel movement 2 days back.  Genitourinary: Negative for dysuria Musculoskeletal: Negative for back pain, falls in facility Skin: Negative for itching, rash.  Neurological: Negative for dizziness Psychiatric/Behavioral: Negative for depression    Past Medical History:  Diagnosis Date  . GERD (gastroesophageal reflux disease)   . Hernia    "large one; on my left stomach" (09/22/2014)  .  Hiatal hernia 10/24/2012  . HTN (hypertension) 10/24/2012  . Hyperlipidemia   . Hypertension   . 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 Osteoporosis, unspecified 10/24/2012  . 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 Pneumonia    "once or twice" (09/22/2014)  . Stroke [facility]) 2007   "left side doesn't work now" (09/22/2014)   Past Surgical History:  Procedure Laterality Date  . APPENDECTOMY  05/27/2009   Ronnald Collum, MD  . CATARACT EXTRACTION W/ INTRAOCULAR LENS  IMPLANT, BILATERAL Bilateral ?2014  . CHOLECYSTECTOMY N/A 09/26/2014   Procedure: LAPAROSCOPIC CHOLECYSTECTOMY WITH INTRAOPERATIVE CHOLANGIOGRAM;  Surgeon: Donnie Mesa, MD;  Location: [facility];  Service: General;  Laterality: N/A;  . COLONOSCOPY WITH PROPOFOL N/A 12/29/2015   Procedure: COLONOSCOPY WITH PROPOFOL;  Surgeon: Wilford Corner, MD;  Location: [facility] ENDOSCOPY;  Service: Endoscopy;  Laterality: N/A;  . ERCP N/A 09/25/2014   Procedure: ENDOSCOPIC RETROGRADE CHOLANGIOPANCREATOGRAPHY (ERCP);  Surgeon: Clarene Essex, MD;  Location: [facility] ENDOSCOPY;  Service: Endoscopy;  Laterality: N/A;   Social History:   reports that he quit smoking about 10 years ago. His smoking use included Cigarettes. He smoked 0.50 packs per day. He has never used smokeless tobacco. He reports that he drinks alcohol. He reports that he does not use drugs.  Family History  Problem Relation Age of Onset  . Heart attack Mother   . Heart attack Father   . Cancer Maternal Aunt     Stomach  . Cancer Paternal [name]    Medications:   Medication List       Accurate as of 03/02/16 12:03 PM. Always use your most  recent med list.          azelastine 0.1 % nasal spray Commonly known as:  ASTELIN Place 1 spray into both nostrils 2 (two) times daily. Use in each nostril as directed   baclofen 10 MG tablet Commonly known as:  LIORESAL Take one tablet by mouth four times daily for muscle spasm   calcium carbonate 1250 (500 Ca) MG tablet Commonly known as:   OS-CAL - dosed in mg of elemental calcium Take 1 tablet by mouth 2 (two) times daily with a meal. 500mg  elemental   Cholecalciferol 2000 units Tabs Take 2,000 Units by mouth daily.   dicyclomine 20 MG tablet Commonly known as:  BENTYL Take 20 mg by mouth 4 (four) times daily.   diphenoxylate-atropine 2.5-0.025 MG tablet Commonly known as:  LOMOTIL Take 1 tablet by mouth every 8 (eight) hours as needed for diarrhea or loose stools.   dipyridamole-aspirin 200-25 MG 12hr capsule Commonly known as:  AGGRENOX Take one capsule by mouth twice daily for CVA   furosemide 20 MG tablet Commonly known as:  LASIX Take 10 mg by mouth daily.   gabapentin 100 MG capsule Commonly known as:  NEURONTIN Take 100 mg by mouth 3 (three) times daily.   loperamide 2 MG capsule Commonly known as:  IMODIUM Take 4 mg by mouth every 6 (six) hours as needed for diarrhea or loose stools.   metoprolol tartrate 25 MG tablet Commonly known as:  LOPRESSOR Take 0.5 tablets (12.5 mg total) by mouth 2 (two) times daily.   nitroGLYCERIN 0.4 MG SL tablet Commonly known as:  NITROSTAT Place 0.4 mg under the tongue every 5 (five) minutes as needed for chest pain.   NUEDEXTA 20-10 MG Caps Generic drug:  Dextromethorphan-Quinidine Take one capsule by mouth once daily for PBA   oxycodone 5 MG capsule Commonly known as:  OXY-IR Take one tablet by mouth every 6 hours as needed for pain   pantoprazole 40 MG tablet Commonly known as:  PROTONIX Take 40 mg by mouth 2 (two) times daily before a meal.   polyethylene glycol packet Commonly known as:  MIRALAX / GLYCOLAX Take 17 g by mouth daily as needed.   PREPLUS 27-1 MG Tabs Take 1 tablet by mouth 2 (two) times daily.   saccharomyces boulardii 250 MG capsule Commonly known as:  FLORASTOR Take 250 mg by mouth daily.   sertraline 25 MG tablet Commonly known as:  ZOLOFT Take 25 mg by mouth daily.   simvastatin 10 MG tablet Commonly known as:  ZOCOR Take  10 mg by mouth daily.       Immunizations: Immunization History  Administered Date(s) Administered  . Influenza-Unspecified 03/25/2013, 02/19/2014, 03/03/2014, 03/03/2015  . PPD Test 01/31/2013, 02/02/2014, 01/26/2015  . Pneumococcal-Unspecified 03/30/2011     Physical Exam: Vitals:   03/02/16 1053  BP: 136/64  Pulse: 67  Resp: 19  Temp: 97.5 F (36.4 C)  TempSrc: Oral  SpO2: 100%  Weight: 162 lb 9.6 oz (73.8 kg)  Height: 5\' 9"  (1.753 m)   Body mass index is 24.01 kg/m.  General- elderly male in no acute distress Head- atraumatic, normocephalic Eyes- PERRLA, EOMI, no pallor, no icterus, no discharge Neck- no cervical lymphadenopathy Nose- no sinus tenderness, no nasal drainage Throat- moist mucus membrane Cardiovascular- normal s1,s2, no murmur, normal dorsalis pedis Respiratory- bilateral clear to auscultation, no wheeze, no rhonchi, no crackles, no use of accessory muscles Abdomen- bowel sounds present, soft, non tender, no guarding or rigidity, no  CVA tenderness Musculoskeletal- left sided hemiplegia, normal strength on right side, trace left leg edema, left hand contracture Neurological- alert and oriented x 3 Skin- warm and dry Psychiatry- normal mood and affect     Labs reviewed: Basic Metabolic Panel:  Recent Labs  08/12/15 02/27/16 0646 02/28/16 0152  NA 141 139 138  K 4.3 4.0 4.0  CL  --  106 107  CO2  --  21* 23  GLUCOSE  --  97 106*  BUN 23* 18 14  CREATININE 0.9 0.87 0.84  CALCIUM  --  8.4* 8.0*   Liver Function Tests:  Recent Labs  02/28/16 0152  AST 26  ALT 25  ALKPHOS 80  BILITOT 0.4  PROT 6.0*  ALBUMIN 2.9*   No results for input(s): LIPASE, AMYLASE in the last 8760 hours. No results for input(s): AMMONIA in the last 8760 hours. CBC:  Recent Labs  07/02/15 02/27/16 0646 02/28/16 0152  WBC 5.8 13.4* 7.7  HGB 14.6 14.4 13.6  HCT 44 44.2 41.7  MCV  --  90.4 90.7  PLT 174 173 140*   Cardiac Enzymes:  Recent Labs   02/27/16 1407 02/27/16 1814  TROPONINI <0.03 <0.03   BNP: Invalid input(s): POCBNP CBG: No results for input(s): GLUCAP in the last 8760 hours.  Radiological Exams: Dg Chest 2 View  Result Date: 02/27/2016 CLINICAL DATA:  Chest pain and shortness of breath EXAM: CHEST  2 VIEW COMPARISON:  Sep 26, 2014 FINDINGS: There is a large hiatal hernia. There is scarring in the lung bases. There is no frank edema or consolidation. There is mild atelectasis in the left lower lobe region, chronic. Heart is upper normal in size with pulmonary vascularity within normal limits. There is atherosclerotic calcification in the aorta. Bones are osteoporotic. No adenopathy evident. IMPRESSION: Large hiatal hernia. Left lower lobe atelectasis with mild bibasilar scarring. Stable cardiac silhouette. There is aortic atherosclerosis. Bones appear osteoporotic. Electronically Signed   By: Lowella Grip III M.D.   On: 02/27/2016 08:18   Ct Angio Chest Pe W/cm &/or Wo Cm  Result Date: 02/27/2016 CLINICAL DATA:  Shortness of breath and chest pain EXAM: CT ANGIOGRAPHY CHEST WITH CONTRAST TECHNIQUE: Multidetector CT imaging of the chest was performed using the standard protocol during bolus administration of intravenous contrast. Multiplanar CT image reconstructions and MIPs were obtained to evaluate the vascular anatomy. CONTRAST:  100 mL Isovue 370 nonionic COMPARISON:  Chest CT April 04, 2014 and chest radiograph February 27, 2016 FINDINGS: Cardiovascular: There is no demonstrable pulmonary embolus. There is dilatation of the ascending thoracic aorta with a maximum measured diameter of 4.8 x 4.5 cm. On the prior study, ascending thoracic aortic diameter measured 4.8 x 4.5 cm as well. There is no thoracic aortic dissection. The visualized great vessels appear normal except for mild atherosclerotic calcification in their proximal aspects. There are foci of coronary artery calcification evident. There is left ventricular  hypertrophy. The pericardium is not appreciably thickened. Mediastinum/Nodes: Thyroid appears unremarkable. There are scattered subcentimeter lymph nodes without adenopathy by size criteria. There is a large paraesophageal hernia with much of the stomach above the diaphragm. There is extensive mediastinal fat within this paraesophageal hernia. Lungs/Pleura: There is chronic bibasilar atelectatic change which is stable compared to the prior study. There is no frank edema or consolidation evident. There are no appreciable pleural effusions. Upper Abdomen: Visualized upper abdominal structures are unremarkable. Musculoskeletal: There are no evident blastic or lytic bone lesions. Review of the MIP images confirms  the above findings. IMPRESSION: No demonstrable pulmonary embolus. Ascending thoracic aorta measures 4.8 x 4.5 cm, essentially stable from prior study. Ascending thoracic aortic aneurysm. Recommend semi-annual imaging followup by CTA or MRA and referral to cardiothoracic surgery if not already obtained. This recommendation follows 2010 ACCF/AHA/AATS/ACR/ASA/SCA/SCAI/SIR/STS/SVM Guidelines for the Diagnosis and Management of Patients With Thoracic Aortic Disease. Circulation. 2010; 121SP:1689793. No dissection. Scattered foci of atherosclerotic calcification in the aorta and proximal great vessels. Foci of coronary artery calcification noted. Large paraesophageal hernia. Chronic bibasilar atelectasis, stable. No frank edema or consolidation. No evident adenopathy. Electronically Signed   By: Lowella Grip III M.D.   On: 02/27/2016 10:20    Assessment/Plan  gerd with hiatal hernia Will have him take protonix 40 mg bid for now and advised to sit upright post meals.  Thoracic aorta aneurysm Noted on ct scan. Get cardiothoracic surgery referral to assess further.   Osteoporosis Noted on imaging. Currently on vitamin d and calcium supplement. With hx of reflux and hiatal hernia, avoid fosamax. Start  him on prolia sq 60 mg q6 month.   Left spastic hemiplegia Post cva, continue baclofen 10 mg qid for spasm. Continue oxycodone 5 mg q6h prn pain  History of cva Continue aggrenox and statin and monitor bo  PBA Continue neudexta for now and monitor, stable mood. Hx of CVA  IBS Continue daily dicyclomine with florastor and prn imodium. Pt to follow with GI  HLD Continue zocor 10 mg daily  HTN Monitor bp,continue lopressor 12.5 mg bid and lasix 20 mg daily, check bmp periodically  Chronic diastolic chf Stable, continue b blocker and lasix, monitor weight and bmp     Goals of care: long term care   Labs/tests ordered: cmp, mg, phos  Family/ staff Communication: reviewed care plan with patient and nursing supervisor    Blanchie Serve, MD Internal Medicine [place], [place] [address] Cell Phone (Monday-Friday 8 am - 5 pm): [phone] On Call: [phone] and follow prompts after 5 pm and on weekends Office Phone: [phone] Office Fax: [phone]

## 2016-03-15 ENCOUNTER — Institutional Professional Consult (permissible substitution) (INDEPENDENT_AMBULATORY_CARE_PROVIDER_SITE_OTHER): Payer: Medicare Other | Admitting: Surgery

## 2016-03-15 VITALS — BP 130/68 | HR 58 | Resp 20 | Ht 69.0 in | Wt 160.0 lb

## 2016-03-15 DIAGNOSIS — I712 Thoracic aortic aneurysm, without rupture, unspecified: Secondary | ICD-10-CM

## 2016-03-19 ENCOUNTER — Encounter: Payer: Self-pay | Admitting: Surgery

## 2016-03-19 NOTE — Progress Notes (Signed)
Cardiothoracic Surgery Consultation   PCP is Blanchie Serve, MD Referring Provider is Lacretia Leigh, MD  Chief Complaint  Patient presents with  . Thoracic Aortic Aneurysm    Surgical eval, CTA Chest 02/27/16    HPI:  The patient is a 75 year old gentleman with hypertension, hyperlipidemia and prior stroke in 09/2014 with left spastic hemiplegia who can transfer but not ambulate. He had a CTA PE study on 02/27/2016 after presenting with chest pain and shortness of breath. This was negative for PE but did show a 4.8 x 4.5 cm fusiform ascending aortic aneurysm which was unchanged from a CT of the chest in 03/2014. There was no dissection. There was a large paraesophageal hernia and left basilar atelectasis. Troponin was negative and his pain resolved with a GI cocktail in the ED.   Past Medical History:  Diagnosis Date  . GERD (gastroesophageal reflux disease)   . Hernia    "large one; on my left stomach" (09/22/2014)  . Hiatal hernia 10/24/2012  . HTN (hypertension) 10/24/2012  . Hyperlipidemia   . Hypertension   . 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 Osteoporosis, unspecified 10/24/2012  . 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 Pneumonia    "once or twice" (09/22/2014)  . Stroke [facility]) 2007   "left side doesn't work now" (09/22/2014)    Past Surgical History:  Procedure Laterality Date  . APPENDECTOMY  05/27/2009   Ronnald Collum, MD  . CATARACT EXTRACTION W/ INTRAOCULAR LENS  IMPLANT, BILATERAL Bilateral ?2014  . CHOLECYSTECTOMY N/A 09/26/2014   Procedure: LAPAROSCOPIC CHOLECYSTECTOMY WITH INTRAOPERATIVE CHOLANGIOGRAM;  Surgeon: Donnie Mesa, MD;  Location: [facility];  Service: General;  Laterality: N/A;  . COLONOSCOPY WITH PROPOFOL N/A 12/29/2015   Procedure: COLONOSCOPY WITH PROPOFOL;  Surgeon: Wilford Corner, MD;  Location: [facility] ENDOSCOPY;  Service: Endoscopy;  Laterality: N/A;  . ERCP N/A 09/25/2014   Procedure: ENDOSCOPIC RETROGRADE CHOLANGIOPANCREATOGRAPHY (ERCP);  Surgeon: Clarene Essex, MD;   Location: [facility] ENDOSCOPY;  Service: Endoscopy;  Laterality: N/A;    Family History  Problem Relation Age of Onset  . Heart attack Mother   . Heart attack Father   . Cancer Maternal Aunt     Stomach  . Cancer Paternal Aunt     Uterine,Colon    Social History Social History  Substance Use Topics  . Smoking status: Former Smoker    Packs/day: 0.50    Types: Cigarettes    Quit date: 12/20/2005  . Smokeless tobacco: Never Used  . Alcohol use Yes     Comment: 09/22/2014 "stopped in 2007; drank occasionally when I did drink"    Current Outpatient Prescriptions  Medication Sig Dispense Refill  . azelastine (ASTELIN) 0.1 % nasal spray Place 1 spray into both nostrils 2 (two) times daily. Use in each nostril as directed    . baclofen (LIORESAL) 10 MG tablet Take one tablet by mouth four times daily for muscle spasm    . calcium carbonate (OS-CAL - DOSED IN MG OF ELEMENTAL CALCIUM) 1250 MG tablet Take 1 tablet by mouth 2 (two) times daily with a meal. 500mg  elemental    . Cholecalciferol 2000 units TABS Take 2,000 Units by mouth daily.    Marland Kitchen Dextromethorphan-Quinidine (NUEDEXTA) 20-10 MG CAPS Take one capsule by mouth once daily for PBA    . dicyclomine (BENTYL) 20 MG tablet Take 20 mg by mouth 4 (four) times daily.     . diphenoxylate-atropine (LOMOTIL) 2.5-0.025 MG tablet Take 1 tablet by mouth every 8 (eight) hours  as needed for diarrhea or loose stools.     . dipyridamole-aspirin (AGGRENOX) 200-25 MG per 12 hr capsule Take one capsule by mouth twice daily for CVA    . furosemide (LASIX) 20 MG tablet Take 10 mg by mouth daily.    Marland Kitchen gabapentin (NEURONTIN) 100 MG capsule Take 100 mg by mouth 3 (three) times daily.    Marland Kitchen loperamide (IMODIUM) 2 MG capsule Take 4 mg by mouth every 6 (six) hours as needed for diarrhea or loose stools.    . metoprolol tartrate (LOPRESSOR) 25 MG tablet Take 0.5 tablets (12.5 mg total) by mouth 2 (two) times daily.    . nitroGLYCERIN (NITROSTAT) 0.4 MG SL tablet Place  0.4 mg under the tongue every 5 (five) minutes as needed for chest pain.    Marland Kitchen oxycodone (OXY-IR) 5 MG capsule Take one tablet by mouth every 6 hours as needed for pain 120 capsule 0  . pantoprazole (PROTONIX) 40 MG tablet Take 40 mg by mouth 2 (two) times daily before a meal.    . polyethylene glycol (MIRALAX / GLYCOLAX) packet Take 17 g by mouth daily as needed.     . Prenatal Vit-Fe Fumarate-FA (PREPLUS) 27-1 MG TABS Take 1 tablet by mouth 2 (two) times daily.    Marland Kitchen saccharomyces boulardii (FLORASTOR) 250 MG capsule Take 250 mg by mouth daily.     . sertraline (ZOLOFT) 25 MG tablet Take 25 mg by mouth daily.    . simvastatin (ZOCOR) 10 MG tablet Take 10 mg by mouth daily.     No current facility-administered medications for this visit.     Allergies  Allergen Reactions  . Penicillins Rash    Review of Systems  Constitutional: Negative for chills, fatigue and fever.       Sedentary since his stroke. Can transfer.  HENT: Negative.   Eyes:       Floaters   Respiratory: Negative.   Cardiovascular: Negative for chest pain.       Some shortness of breath with exertion  Gastrointestinal:       Hiatal hernia and reflux  Endocrine: Negative.   Genitourinary: Positive for dysuria.       Recurrent UTI  Musculoskeletal: Negative.   Skin: Negative.   Allergic/Immunologic: Negative.   Neurological:       Stroke with left spastic hemiplegia  Hematological: Bruises/bleeds easily.  Psychiatric/Behavioral: Negative.     BP 130/68 (BP Location: Right Arm, Patient Position: Sitting, Cuff Size: Normal)   Pulse (!) 58   Resp 20   Ht 5\' 9"  (1.753 m)   Wt 160 lb (72.6 kg)   SpO2 92% Comment: RA  BMI 23.63 kg/m  Physical Exam  Constitutional: He is oriented to person, place, and time. He appears well-developed and well-nourished. No distress.  HENT:  Head: Normocephalic and atraumatic.  Mouth/Throat: Oropharynx is clear and moist.  Eyes: EOM are normal. Pupils are equal, round, and  reactive to light.  Neck: No JVD present. No thyromegaly present.  Cardiovascular: Normal rate, regular rhythm and normal heart sounds.   No murmur heard. Pulmonary/Chest: Effort normal.  Diminished breath sounds left base  Abdominal: Soft. Bowel sounds are normal. He exhibits no distension. There is no tenderness.  Musculoskeletal: He exhibits no edema.  Lymphadenopathy:    He has no cervical adenopathy.  Neurological: He is alert and oriented to person, place, and time.  Left spastic hemiplegia  Skin: Skin is warm and dry.  Psychiatric: He has a normal mood  and affect.    Diagnostic Tests:  CLINICAL DATA:  Shortness of breath and chest pain  EXAM: CT ANGIOGRAPHY CHEST WITH CONTRAST  TECHNIQUE: Multidetector CT imaging of the chest was performed using the standard protocol during bolus administration of intravenous contrast. Multiplanar CT image reconstructions and MIPs were obtained to evaluate the vascular anatomy.  CONTRAST:  100 mL Isovue 370 nonionic  COMPARISON:  Chest CT April 04, 2014 and chest radiograph February 27, 2016  FINDINGS: Cardiovascular: There is no demonstrable pulmonary embolus. There is dilatation of the ascending thoracic aorta with a maximum measured diameter of 4.8 x 4.5 cm. On the prior study, ascending thoracic aortic diameter measured 4.8 x 4.5 cm as well. There is no thoracic aortic dissection. The visualized great vessels appear normal except for mild atherosclerotic calcification in their proximal aspects. There are foci of coronary artery calcification evident. There is left ventricular hypertrophy. The pericardium is not appreciably thickened.  Mediastinum/Nodes: Thyroid appears unremarkable. There are scattered subcentimeter lymph nodes without adenopathy by size criteria. There is a large paraesophageal hernia with much of the stomach above the diaphragm. There is extensive mediastinal fat within this paraesophageal  hernia.  Lungs/Pleura: There is chronic bibasilar atelectatic change which is stable compared to the prior study. There is no frank edema or consolidation evident. There are no appreciable pleural effusions.  Upper Abdomen: Visualized upper abdominal structures are unremarkable.  Musculoskeletal: There are no evident blastic or lytic bone lesions.  Review of the MIP images confirms the above findings.  IMPRESSION: No demonstrable pulmonary embolus.  Ascending thoracic aorta measures 4.8 x 4.5 cm, essentially stable from prior study. Ascending thoracic aortic aneurysm. Recommend semi-annual imaging followup by CTA or MRA and referral to cardiothoracic surgery if not already obtained. This recommendation follows 2010 ACCF/AHA/AATS/ACR/ASA/SCA/SCAI/SIR/STS/SVM Guidelines for the Diagnosis and Management of Patients With Thoracic Aortic Disease. Circulation. 2010; [id number]. No dissection. Scattered foci of atherosclerotic calcification in the aorta and proximal great vessels. Foci of coronary artery calcification noted.  Large paraesophageal hernia. Chronic bibasilar atelectasis, stable. No frank edema or consolidation. No evident adenopathy.   Electronically Signed   By: Lowella Grip III M.D.   On: 02/27/2016 10:20   Impression:  He has a 4.8 x 4.5 cm fusiform ascending aortic aneurysm that has been stable since his prior CT of the chest on 04/04/2014. This is below the 5.5 cm threshold for recommending surgical resection. I have personally reviewed and interpreted his CT scans from 02/27/2016 and 04/04/2014 as well as his echo from 04/03/2014. He appeared to have a trileaflet aortic valve with mild AI and mildly thickened and calcified leaflets without any stenosis. His CT scans do show a large paraesophageal hernia with associated left basilar atelectasis and this could be the cause of his intermittent chest pain and shortness of breath. If he has recurrent  symptoms this could be evaluated by general surgery.   I reviewed the CT scans with the patient and his sister and answered their questions. I have recommended repeating his CT in one year and I will see him back at that time.  Plan:  Follow up in one year with a CTA of the chest.   I spent 60 minutes performing this consultation and > 50% of this time was spent face to face counseling and coordinating the care of this patient's ascending aortic aneurysm.   Gaye Pollack, MD [facility] [phone]

## 2016-03-22 LAB — HEPATIC FUNCTION PANEL
ALT: 18 U/L (ref 10–40)
AST: 21 U/L (ref 14–40)
Alkaline Phosphatase: 107 U/L (ref 25–125)
Bilirubin, Total: 0.3 mg/dL

## 2016-03-22 LAB — BASIC METABOLIC PANEL
BUN: 18 mg/dL (ref 4–21)
CREATININE: 0.8 mg/dL (ref 0.6–1.3)
GLUCOSE: 81 mg/dL
POTASSIUM: 4.1 mmol/L (ref 3.4–5.3)
SODIUM: 141 mmol/L (ref 137–147)

## 2016-04-27 ENCOUNTER — Non-Acute Institutional Stay (SKILLED_NURSING_FACILITY): Payer: Medicare Other | Admitting: Internal Medicine

## 2016-04-27 ENCOUNTER — Encounter: Payer: Self-pay | Admitting: Internal Medicine

## 2016-04-27 DIAGNOSIS — K219 Gastro-esophageal reflux disease without esophagitis: Secondary | ICD-10-CM | POA: Diagnosis not present

## 2016-04-27 DIAGNOSIS — K449 Diaphragmatic hernia without obstruction or gangrene: Secondary | ICD-10-CM | POA: Diagnosis not present

## 2016-04-27 DIAGNOSIS — J302 Other seasonal allergic rhinitis: Secondary | ICD-10-CM

## 2016-04-27 DIAGNOSIS — M818 Other osteoporosis without current pathological fracture: Secondary | ICD-10-CM

## 2016-04-27 DIAGNOSIS — J309 Allergic rhinitis, unspecified: Secondary | ICD-10-CM | POA: Insufficient documentation

## 2016-04-27 NOTE — Progress Notes (Signed)
LOCATION: [facility]  PCP: Blanchie Serve, MD   Code Status: Full Code  Goals of care: Advanced Directive information Advanced Directives 02/27/2016  Does Patient Have a Medical Advance Directive? Yes  Type of Advance Directive [place]  Does patient want to make changes to medical advance directive? No - Patient declined  Copy of [place] in Chart? No - copy requested  Would patient like information on creating a medical advance directive? No - patient declined information       Extended Emergency Contact Information Primary Emergency Contact: [name],[name]          [place] Phone: [phone] Mobile Phone: [phone] Relation: Sister   Allergies  Allergen Reactions  . Penicillins Rash    Chief Complaint  Patient presents with  . Medical Management of Chronic Issues    Routine Visit     HPI:  Patient is a 75 y.o. male seen today for routine visit. He denies any concern. he rarely has had loose stool for past few weeks. His appetite is good.   Review of Systems:  Constitutional: Negative for fever.  HENT: Negative for headache, congestion, nasal discharge Eyes: Negative for double vision and discharge.  Respiratory: Negative for cough, shortness of breath  Cardiovascular: Negative for chest pain, palpitations Gastrointestinal: Negative for heartburn, nausea, vomiting, abdominal pain.  Genitourinary: Negative for dysuria Musculoskeletal: Negative for fall in facility Skin: Negative for itching, rash.  Neurological: Negative for dizziness Psychiatric/Behavioral: Negative for depression    Past Medical History:  Diagnosis Date  . GERD (gastroesophageal reflux disease)   . Hernia    "large one; on my left stomach" (09/22/2014)  . Hiatal hernia 10/24/2012  . HTN (hypertension) 10/24/2012  . Hyperlipidemia   . Hypertension   . 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 Osteoporosis,  unspecified 10/24/2012  . 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 Pneumonia    "once or twice" (09/22/2014)  . Stroke [facility]) 2007   "left side doesn't work now" (09/22/2014)   Past Surgical History:  Procedure Laterality Date  . APPENDECTOMY  05/27/2009   Ronnald Collum, MD  . CATARACT EXTRACTION W/ INTRAOCULAR LENS  IMPLANT, BILATERAL Bilateral ?2014  . CHOLECYSTECTOMY N/A 09/26/2014   Procedure: LAPAROSCOPIC CHOLECYSTECTOMY WITH INTRAOPERATIVE CHOLANGIOGRAM;  Surgeon: Donnie Mesa, MD;  Location: [place];  Service: General;  Laterality: N/A;  . COLONOSCOPY WITH PROPOFOL N/A 12/29/2015   Procedure: COLONOSCOPY WITH PROPOFOL;  Surgeon: Wilford Corner, MD;  Location: [facility] ENDOSCOPY;  Service: Endoscopy;  Laterality: N/A;  . ERCP N/A 09/25/2014   Procedure: ENDOSCOPIC RETROGRADE CHOLANGIOPANCREATOGRAPHY (ERCP);  Surgeon: Clarene Essex, MD;  Location: [facility] ENDOSCOPY;  Service: Endoscopy;  Laterality: N/A;    Medications:   Medication List       Accurate as of 04/27/16  3:38 PM. Always use your most recent med list.          acetaminophen 325 MG tablet Commonly known as:  TYLENOL Take 650 mg by mouth every 4 (four) hours as needed for mild pain or headache.   azelastine 0.1 % nasal spray Commonly known as:  ASTELIN Place 1 spray into both nostrils 2 (two) times daily. Use in each nostril as directed   baclofen 10 MG tablet Commonly known as:  LIORESAL Take one tablet by mouth four times daily for muscle spasm   calcium carbonate 1250 (500 Ca) MG tablet Commonly known as:  OS-CAL - dosed in mg of elemental calcium Take 1  tablet by mouth 2 (two) times daily with a meal. 500mg  elemental   Cholecalciferol 2000 units Tabs Take 2,000 Units by mouth daily.   denosumab 60 MG/ML Soln injection Commonly known as:  PROLIA Inject 60 mg into the skin every 6 (six) months. Administer in upper arm, thigh, or abdomen   dipyridamole-aspirin 200-25 MG 12hr capsule Commonly known as:  AGGRENOX Take one  capsule by mouth twice daily for CVA   furosemide 20 MG tablet Commonly known as:  LASIX Take 10 mg by mouth daily.   gabapentin 100 MG capsule Commonly known as:  NEURONTIN Take 100 mg by mouth 3 (three) times daily.   metoprolol tartrate 25 MG tablet Commonly known as:  LOPRESSOR Take 0.5 tablets (12.5 mg total) by mouth 2 (two) times daily.   nitroGLYCERIN 0.4 MG SL tablet Commonly known as:  NITROSTAT Place 0.4 mg under the tongue every 5 (five) minutes as needed for chest pain.   NUEDEXTA 20-10 MG Caps Generic drug:  Dextromethorphan-Quinidine Take one capsule by mouth once daily for PBA   oxycodone 5 MG capsule Commonly known as:  OXY-IR Take one tablet by mouth every 6 hours as needed for pain   pantoprazole 40 MG tablet Commonly known as:  PROTONIX Take 40 mg by mouth 2 (two) times daily before a meal.   PEPPERMINT OIL PO Take 1 capsule by mouth 2 (two) times daily.   polyethylene glycol packet Commonly known as:  MIRALAX / GLYCOLAX Take 17 g by mouth daily as needed.   PREPLUS 27-1 MG Tabs Take 1 tablet by mouth 2 (two) times daily.   saccharomyces boulardii 250 MG capsule Commonly known as:  FLORASTOR Take 250 mg by mouth daily.   sertraline 25 MG tablet Commonly known as:  ZOLOFT Take 25 mg by mouth daily.   simvastatin 10 MG tablet Commonly known as:  ZOCOR Take 10 mg by mouth daily.       Immunizations: Immunization History  Administered Date(s) Administered  . Influenza-Unspecified 03/25/2013, 02/19/2014, 03/03/2014, 03/03/2015, 02/24/2016  . PPD Test 01/31/2013, 02/02/2014, 01/26/2015  . Pneumococcal-Unspecified 03/30/2011     Physical Exam: Vitals:   04/27/16 1528  BP: (!) 143/65  Pulse: 75  Resp: 18  Temp: 97.9 F (36.6 C)  TempSrc: Oral  SpO2: 95%  Weight: 162 lb 12.8 oz (73.8 kg)  Height: 5\' 9"  (1.753 m)   Body mass index is 24.04 kg/m.  General- elderly male in no acute distress Head- atraumatic, normocephalic Neck-  no cervical lymphadenopathy Throat- moist mucus membrane Cardiovascular- normal s1,s2, no murmur Respiratory- bilateral clear to auscultation Abdomen- bowel sounds present, soft, non tender, no guarding or rigidity, no CVA tenderness Musculoskeletal- left sided hemiplegia, normal strength on right side, trace left leg edema, left hand contracture Neurological- alert and oriented x 3 Skin- warm and dry Psychiatry- normal mood and affect     Labs reviewed: Basic Metabolic Panel:  Recent Labs  02/27/16 0646 02/28/16 0152 03/22/16  NA 139 138 141  K 4.0 4.0 4.1  CL 106 107  --   CO2 21* 23  --   GLUCOSE 97 106*  --   BUN 18 14 18   CREATININE 0.87 0.84 0.8  CALCIUM 8.4* 8.0*  --    Liver Function Tests:  Recent Labs  02/28/16 0152 03/22/16  AST 26 21  ALT 25 18  ALKPHOS 80 107  BILITOT 0.4  --   PROT 6.0*  --   ALBUMIN 2.9*  --  No results for input(s): LIPASE, AMYLASE in the last 8760 hours. No results for input(s): AMMONIA in the last 8760 hours. CBC:  Recent Labs  07/02/15 02/27/16 0646 02/28/16 0152  WBC 5.8 13.4* 7.7  HGB 14.6 14.4 13.6  HCT 44 44.2 41.7  MCV  --  90.4 90.7  PLT 174 173 140*     Assessment/Plan  Disuse osteoporosis Wheelchair bound, no fall reported, no recent pathological fracture. continue prolia injection every 6 month. Continue calcium supplement  Allergic rhinitis Stable, continue azelastine nasal spray and monitor  gerd with hiatal hernia Stable symptom on protonix 40 mg bid. No further chest pain. Monitor and consider decreasing protonix to 40 mg daily next visit.     Family/ staff Communication: reviewed care plan with patient and nursing supervisor    Blanchie Serve, MD Internal Medicine [place], [place] [address] Cell Phone (Monday-Friday 8 am - 5 pm): [phone] On Call: [phone] and follow prompts after 5 pm and on weekends Office Phone:  [phone] Office Fax: [phone]

## 2016-05-18 ENCOUNTER — Other Ambulatory Visit: Payer: Self-pay

## 2016-05-18 MED ORDER — OXYCODONE HCL 5 MG PO CAPS: ORAL_CAPSULE | ORAL | 0 refills | Status: DC

## 2016-05-18 NOTE — Telephone Encounter (Signed)
Prescription request was received from:  [place] [id number]  Phone: [phone]  Fax: [phone]

## 2016-08-10 ENCOUNTER — Non-Acute Institutional Stay (SKILLED_NURSING_FACILITY): Payer: Medicare Other | Admitting: Internal Medicine

## 2016-08-10 ENCOUNTER — Encounter: Payer: Self-pay | Admitting: Internal Medicine

## 2016-08-10 DIAGNOSIS — G8112 Spastic hemiplegia affecting left dominant side: Secondary | ICD-10-CM

## 2016-08-10 DIAGNOSIS — F482 Pseudobulbar affect: Secondary | ICD-10-CM

## 2016-08-10 DIAGNOSIS — K580 Irritable bowel syndrome with diarrhea: Secondary | ICD-10-CM

## 2016-08-10 DIAGNOSIS — E785 Hyperlipidemia, unspecified: Secondary | ICD-10-CM

## 2016-08-10 DIAGNOSIS — J310 Chronic rhinitis: Secondary | ICD-10-CM

## 2016-08-10 DIAGNOSIS — G8114 Spastic hemiplegia affecting left nondominant side: Secondary | ICD-10-CM

## 2016-08-10 NOTE — Progress Notes (Signed)
LOCATION: [facility]  PCP: Blanchie Serve, MD   Code Status: Full Code  Goals of care: Advanced Directive information Advanced Directives 02/27/2016  Does Patient Have a Medical Advance Directive? Yes  Type of Advance Directive [place]  Does patient want to make changes to medical advance directive? No - Patient declined  Copy of [place] in Chart? No - copy requested  Would patient like information on creating a medical advance directive? No - patient declined information       Extended Emergency Contact Information Primary Emergency Contact: [name],[name]          [place] of [place] Phone: [phone] Mobile Phone: [phone] Relation: Sister   Allergies  Allergen Reactions  . Penicillins Rash    Chief Complaint  Patient presents with  . Medical Management of Chronic Issues    Routine Visit      HPI:  Patient is a 76 y.o. male seen today for routine visit. He complaints of clear nasal discharge for several weeks with eating and shower. He denies thick mucus or blood in discharge. Denies cough, dyspnea or sore throat. Denies nasal congestion or post nasal drip. He continues to have 1 large episode of loose stool in am every few days preceded by flatus. Denies abdominal pain or cramp. Denies nausea or vomiting. Appetite has been good. He is out of bed daily. Denies any other concern. He is compliant with his medications.    Review of Systems:  Constitutional: Negative for fever.  HENT: Negative for headache, congestion, earache.  Eyes: Negative for double vision and discharge.  Respiratory: Negative for cough, shortness of breath  Cardiovascular: Negative for chest pain, palpitations Gastrointestinal: Negative for heartburn, nausea, vomiting, abdominal pain.  Genitourinary: Negative for dysuria Musculoskeletal: Negative for fall in facility Skin: Negative for itching, rash.    Neurological: Negative for dizziness Psychiatric/Behavioral: Negative for depression    Past Medical History:  Diagnosis Date  . GERD (gastroesophageal reflux disease)   . Hernia    "large one; on my left stomach" (09/22/2014)  . Hiatal hernia 10/24/2012  . HTN (hypertension) 10/24/2012  . Hyperlipidemia   . Hypertension   . 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 Osteoporosis, unspecified 10/24/2012  . 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 Pneumonia    "once or twice" (09/22/2014)  . Stroke [facility]) 2007   "left side doesn't work now" (09/22/2014)   Past Surgical History:  Procedure Laterality Date  . APPENDECTOMY  05/27/2009   Ronnald Collum, MD  . CATARACT EXTRACTION W/ INTRAOCULAR LENS  IMPLANT, BILATERAL Bilateral ?2014  . CHOLECYSTECTOMY N/A 09/26/2014   Procedure: LAPAROSCOPIC CHOLECYSTECTOMY WITH INTRAOPERATIVE CHOLANGIOGRAM;  Surgeon: Donnie Mesa, MD;  Location: [place];  Service: General;  Laterality: N/A;  . COLONOSCOPY WITH PROPOFOL N/A 12/29/2015   Procedure: COLONOSCOPY WITH PROPOFOL;  Surgeon: Wilford Corner, MD;  Location[facility] ENDOSCOPY;  Service: Endoscopy;  Laterality: N/A;  . ERCP N/A 09/25/2014   Procedure: ENDOSCOPIC RETROGRADE CHOLANGIOPANCREATOGRAPHY (ERCP);  Surgeon: Clarene Essex, MD;  Location: [facility] ENDOSCOPY;  Service: Endoscopy;  Laterality: N/A;    Medications: Allergies as of 08/10/2016      Reactions   Penicillins Rash      Medication List       Accurate as of 08/10/16 11:33 AM. Always use your most recent med list.          acetaminophen 325 MG tablet Commonly known as:  TYLENOL Take 650 mg by mouth every 4 (  four) hours as needed for mild pain or headache.   azelastine 0.1 % nasal spray Commonly known as:  ASTELIN Place 1 spray into both nostrils 2 (two) times daily. Use in each nostril as directed   baclofen 10 MG tablet Commonly known as:  LIORESAL Take one tablet by mouth four times daily for muscle spasm   calcium carbonate 1250 (500 Ca) MG tablet Commonly  known as:  OS-CAL - dosed in mg of elemental calcium Take 1 tablet by mouth 2 (two) times daily with a meal. 500mg  elemental   Cholecalciferol 2000 units Tabs Take 2,000 Units by mouth daily.   denosumab 60 MG/ML Soln injection Commonly known as:  PROLIA Inject 60 mg into the skin every 6 (six) months. Administer in upper arm, thigh, or abdomen   dipyridamole-aspirin 200-25 MG 12hr capsule Commonly known as:  AGGRENOX Take one capsule by mouth twice daily for CVA   furosemide 20 MG tablet Commonly known as:  LASIX Take 10 mg by mouth daily.   gabapentin 100 MG capsule Commonly known as:  NEURONTIN Take 100 mg by mouth 3 (three) times daily.   metoprolol tartrate 25 MG tablet Commonly known as:  LOPRESSOR Take 0.5 tablets (12.5 mg total) by mouth 2 (two) times daily.   nitroGLYCERIN 0.4 MG SL tablet Commonly known as:  NITROSTAT Place 0.4 mg under the tongue every 5 (five) minutes as needed for chest pain.   NUEDEXTA 20-10 MG Caps Generic drug:  Dextromethorphan-Quinidine Take one capsule by mouth once daily for PBA   oxycodone 5 MG capsule Commonly known as:  OXY-IR Take one tablet by mouth every 6 hours as needed for pain   pantoprazole 40 MG tablet Commonly known as:  PROTONIX Take 40 mg by mouth 2 (two) times daily before a meal.   PEPPERMINT OIL PO Take 1 capsule by mouth 2 (two) times daily.   polyethylene glycol packet Commonly known as:  MIRALAX / GLYCOLAX Take 17 g by mouth daily as needed.   PREPLUS 27-1 MG Tabs Take 1 tablet by mouth 2 (two) times daily.   saccharomyces boulardii 250 MG capsule Commonly known as:  FLORASTOR Take 250 mg by mouth daily.   sertraline 25 MG tablet Commonly known as:  ZOLOFT Take 25 mg by mouth daily.   simvastatin 10 MG tablet Commonly known as:  ZOCOR Take 10 mg by mouth daily.       Immunizations: Immunization History  Administered Date(s) Administered  . Influenza-Unspecified 03/25/2013, 02/19/2014,  03/03/2014, 03/03/2015, 02/24/2016  . PPD Test 01/31/2013, 02/02/2014, 01/26/2015  . Pneumococcal-Unspecified 03/30/2011     Physical Exam: Vitals:   08/10/16 1124  BP: 130/79  Pulse: 67  Resp: 18  Temp: 97.3 F (36.3 C)  TempSrc: Oral  SpO2: 93%  Weight: 165 lb 11.2 oz (75.2 kg)  Height: 5\' 9"  (1.753 m)   Body mass index is 24.47 kg/m.  General- elderly male in no acute distress Head- atraumatic, normocephalic Neck- no cervical lymphadenopathy Nose- no nasal discharge at present, no sinus tenderness Throat- moist mucus membrane Cardiovascular- normal s1,s2, no murmur Respiratory- bilateral clear to auscultation Abdomen- bowel sounds present, soft, non tender, no guarding or rigidity, no CVA tenderness Musculoskeletal- left sided hemiplegia, normal strength on right side, trace left leg edema, left hand contracture Neurological- alert and oriented x 3 Skin- warm and dry Psychiatry- normal mood and affect     Labs reviewed: Basic Metabolic Panel:  Recent Labs  02/27/16 0646 02/28/16 0152 03/22/16  NA 139 138 141  K 4.0 4.0 4.1  CL 106 107  --   CO2 21* 23  --   GLUCOSE 97 106*  --   BUN 18 14 18   CREATININE 0.87 0.84 0.8  CALCIUM 8.4* 8.0*  --    Liver Function Tests:  Recent Labs  02/28/16 0152 03/22/16  AST 26 21  ALT 25 18  ALKPHOS 80 107  BILITOT 0.4  --   PROT 6.0*  --   ALBUMIN 2.9*  --    No results for input(s): LIPASE, AMYLASE in the last 8760 hours. No results for input(s): AMMONIA in the last 8760 hours. 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 Denies any nasal congestion or PND. Currently on azelastine spray without much help per patient. Will discontinue this and try ipratropium nasal spray tid for now. If no improvement with  this, get ENT referral  PBA Post cva, stable, continue nuedexta and monitor  IBS with diarrhea Controlled. Continue IBgard and monitor. To follow with Dr Paulita Fujita on need basis  Left hemiplegia with spasticity Post cva, fall precautions, routine botox injection and prn pain medication. Continue statin and aggrenox. Continue baclofen qid. Continue to wear his brace.   Hyperlipidemia Lipid Panel     Component Value Date/Time   CHOL 130 02/28/2016 0152   TRIG 46 02/28/2016 0152   HDL 42 02/28/2016 0152   CHOLHDL 3.1 02/28/2016 0152   VLDL 9 02/28/2016 0152   LDLCALC 79 02/28/2016 0152   LDL close to goal. Continue simvastatin 10 mg daily    Family/ staff Communication: reviewed care plan with patient and nursing supervisor    Blanchie Serve, MD Internal Medicine [place], [place] [address] Cell Phone (Monday-Friday 8 am - 5 pm): [phone] On Call: [phone] and follow prompts after 5 pm and on weekends Office Phone: [phone] Office Fax: [phone]

## 2016-08-15 LAB — BASIC METABOLIC PANEL
BUN: 21 mg/dL (ref 4–21)
CREATININE: 0.8 mg/dL (ref 0.6–1.3)
GLUCOSE: 88 mg/dL
POTASSIUM: 3.9 mmol/L (ref 3.4–5.3)
Sodium: 144 mmol/L (ref 137–147)

## 2016-08-15 LAB — HEPATIC FUNCTION PANEL
ALT: 15 U/L (ref 10–40)
AST: 17 U/L (ref 14–40)
Alkaline Phosphatase: 82 U/L (ref 25–125)
BILIRUBIN, TOTAL: 0.4 mg/dL

## 2016-08-15 LAB — CBC AND DIFFERENTIAL
HCT: 44 % (ref 41–53)
HEMOGLOBIN: 14.5 g/dL (ref 13.5–17.5)
Platelets: 160 10*3/uL (ref 150–399)
WBC: 5.5 10*3/mL

## 2016-08-15 LAB — LIPID PANEL
Cholesterol: 171 mg/dL (ref 0–200)
HDL: 38 mg/dL (ref 35–70)
LDL Cholesterol: 110 mg/dL
Triglycerides: 112 mg/dL (ref 40–160)

## 2016-08-15 LAB — HEMOGLOBIN A1C: Hemoglobin A1C: 5.7

## 2016-08-15 LAB — TSH: TSH: 3.69 u[IU]/mL (ref 0.41–5.90)

## 2016-09-08 LAB — CBC AND DIFFERENTIAL
HCT: 45 % (ref 41–53)
Hemoglobin: 15.3 g/dL (ref 13.5–17.5)
Platelets: 168 10*3/uL (ref 150–399)
WBC: 5.4 10*3/mL

## 2016-09-13 ENCOUNTER — Encounter: Payer: Self-pay | Admitting: Internal Medicine

## 2016-09-13 ENCOUNTER — Non-Acute Institutional Stay (SKILLED_NURSING_FACILITY): Payer: Medicare Other | Admitting: Internal Medicine

## 2016-09-13 DIAGNOSIS — F341 Dysthymic disorder: Secondary | ICD-10-CM

## 2016-09-13 DIAGNOSIS — Z8719 Personal history of other diseases of the digestive system: Secondary | ICD-10-CM

## 2016-09-13 DIAGNOSIS — M818 Other osteoporosis without current pathological fracture: Secondary | ICD-10-CM | POA: Diagnosis not present

## 2016-09-13 DIAGNOSIS — F329 Major depressive disorder, single episode, unspecified: Secondary | ICD-10-CM

## 2016-09-13 NOTE — Progress Notes (Signed)
LOCATION: [facility]  PCP: Blanchie Serve, MD   Code Status: Full Code  Goals of care: Advanced Directive information Advanced Directives 02/27/2016  Does Patient Have a Medical Advance Directive? Yes  Type of Advance Directive [place]  Does patient want to make changes to medical advance directive? No - Patient declined  Copy of [place] in Chart? No - copy requested  Would patient like information on creating a medical advance directive? No - patient declined information       Extended Emergency Contact Information Primary Emergency Contact: [name],[name]          Hislop Spaniel [place] Phone: [phone] Mobile Phone: [phone] Relation: Sister   Allergies  Allergen Reactions  . Penicillins Rash    Chief Complaint  Patient presents with  . Medical Management of Chronic Issues    Routine Visit      HPI:  Patient is a 76 y.o. male seen today for routine visit. He denies any complaints this visit. Of note, last Friday, he noticed blood on his wipes while cleaning his buttock after a bowel movement. He mentions straining that visit and having some rectal pain. Staff noticed a marble size lesion to his anus that was not bleeding. Patient denies any further bleed after that. No blood in stool, rectal pain reported. He is off Zoloft in attempt for GDR and mood has been stable.   Review of Systems:  Constitutional: Negative for fever.  HENT: Negative for headache, congestion Eyes: Negative for double vision and discharge.  Respiratory: Negative for cough, shortness of breath  Cardiovascular: Negative for chest pain, palpitations Gastrointestinal: Negative for heartburn, nausea, vomiting, abdominal pain and bowel straining, constipation.  Genitourinary: Negative for dysuria Musculoskeletal: Negative for fall in facility Skin: Negative for itching, rash.  Neurological: Negative for  dizziness Psychiatric/Behavioral: Negative for depression    Past Medical History:  Diagnosis Date  . GERD (gastroesophageal reflux disease)   . Hernia    "large one; on my left stomach" (09/22/2014)  . Hiatal hernia 10/24/2012  . HTN (hypertension) 10/24/2012  . Hyperlipidemia   . Hypertension   . 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 Osteoporosis, unspecified 10/24/2012  . 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 Pneumonia    "once or twice" (09/22/2014)  . Stroke [facility]) 2007   "left side doesn't work now" (09/22/2014)   Past Surgical History:  Procedure Laterality Date  . APPENDECTOMY  05/27/2009   Ronnald Collum, MD  . CATARACT EXTRACTION W/ INTRAOCULAR LENS  IMPLANT, BILATERAL Bilateral ?2014  . CHOLECYSTECTOMY N/A 09/26/2014   Procedure: LAPAROSCOPIC CHOLECYSTECTOMY WITH INTRAOPERATIVE CHOLANGIOGRAM;  Surgeon: Donnie Mesa, MD;  Location: [facility];  Service: General;  Laterality: N/A;  . COLONOSCOPY WITH PROPOFOL N/A 12/29/2015   Procedure: COLONOSCOPY WITH PROPOFOL;  Surgeon: Wilford Corner, MD;  Location: [facility] ENDOSCOPY;  Service: Endoscopy;  Laterality: N/A;  . ERCP N/A 09/25/2014   Procedure: ENDOSCOPIC RETROGRADE CHOLANGIOPANCREATOGRAPHY (ERCP);  Surgeon: Clarene Essex, MD;  Location: [facility] ENDOSCOPY;  Service: Endoscopy;  Laterality: N/A;    Medications: Allergies as of 09/13/2016      Reactions   Penicillins Rash      Medication List       Accurate as of 09/13/16  4:05 PM. Always use your most recent med list.          acetaminophen 325 MG tablet Commonly known as:  TYLENOL Take 650 mg by mouth every 4 (four) hours as needed for mild  pain or headache.   baclofen 10 MG tablet Commonly known as:  LIORESAL Take one tablet by mouth four times daily for muscle spasm   calcium carbonate 1250 (500 Ca) MG tablet Commonly known as:  OS-CAL - dosed in mg of elemental calcium Take 1 tablet by mouth 2 (two) times daily with a meal. 500mg  elemental   Cholecalciferol 2000 units Tabs Take 2,000  Units by mouth daily.   denosumab 60 MG/ML Soln injection Commonly known as:  PROLIA Inject 60 mg into the skin every 6 (six) months. Administer in upper arm, thigh, or abdomen   dipyridamole-aspirin 200-25 MG 12hr capsule Commonly known as:  AGGRENOX Take one capsule by mouth twice daily for CVA   feeding supplement (PRO-STAT SUGAR FREE 64) Liqd Take 30 mLs by mouth daily. Stop date 09/29/16   furosemide 20 MG tablet Commonly known as:  LASIX Take 10 mg by mouth daily.   gabapentin 100 MG capsule Commonly known as:  NEURONTIN Take 100 mg by mouth 3 (three) times daily.   ipratropium 0.03 % nasal spray Commonly known as:  ATROVENT Place 2 sprays into both nostrils 3 (three) times daily.   metoprolol tartrate 25 MG tablet Commonly known as:  LOPRESSOR Take 0.5 tablets (12.5 mg total) by mouth 2 (two) times daily.   nitroGLYCERIN 0.4 MG SL tablet Commonly known as:  NITROSTAT Place 0.4 mg under the tongue every 5 (five) minutes as needed for chest pain.   NUEDEXTA 20-10 MG Caps Generic drug:  Dextromethorphan-Quinidine Take one capsule by mouth once daily for PBA   oxycodone 5 MG capsule Commonly known as:  OXY-IR Take one tablet by mouth every 6 hours as needed for pain   pantoprazole 40 MG tablet Commonly known as:  PROTONIX Take 40 mg by mouth 2 (two) times daily before a meal.   polyethylene glycol packet Commonly known as:  MIRALAX / GLYCOLAX Take 17 g by mouth daily as needed.   PREPLUS 27-1 MG Tabs Take 1 tablet by mouth 2 (two) times daily.   saccharomyces boulardii 250 MG capsule Commonly known as:  FLORASTOR Take 250 mg by mouth daily.   shark liver oil-cocoa butter 0.25-3-85.5 % suppository Commonly known as:  PREPARATION H Place 1 suppository rectally daily as needed for hemorrhoids.   simvastatin 20 MG tablet Commonly known as:  ZOCOR Take 20 mg by mouth at bedtime.       Immunizations: Immunization History  Administered Date(s)  Administered  . Influenza-Unspecified 03/25/2013, 02/19/2014, 03/03/2014, 03/03/2015, 02/24/2016  . PPD Test 01/31/2013, 02/02/2014, 01/26/2015  . Pneumococcal-Unspecified 03/30/2011     Physical Exam: Vitals:   09/13/16 1406  BP: 118/64  Pulse: 73  Resp: 18  Temp: 98.3 F (36.8 C)  TempSrc: Oral  SpO2: 97%  Weight: 166 lb (75.3 kg)  Height: 5\' 9"  (1.753 m)   Body mass index is 24.51 kg/m.  General- elderly male in no acute distress Head- atraumatic, normocephalic Neck- no cervical lymphadenopathy Nose- no nasal discharge Throat- moist mucus membrane Cardiovascular- normal s1,s2, no murmur Respiratory- bilateral clear to auscultation Abdomen- bowel sounds present, soft, non tender, no guarding or rigidity Musculoskeletal- left sided hemiplegia, normal strength on right side, trace left leg edema, left hand contracture Neurological- alert and oriented x 3 Skin- warm and dry Psychiatry- normal mood and affect     Labs reviewed: Basic Metabolic Panel:  Recent Labs  02/27/16 0646 02/28/16 0152 03/22/16 08/15/16  NA 139 138 141 144  K 4.0 4.0  4.1 3.9  CL 106 107  --   --   CO2 21* 23  --   --   GLUCOSE 97 106*  --   --   BUN 18 14 18 21   CREATININE 0.87 0.84 0.8 0.8  CALCIUM 8.4* 8.0*  --   --    Liver Function Tests:  Recent Labs  02/28/16 0152 03/22/16 08/15/16  AST 26 21 17   ALT 25 18 15   ALKPHOS 80 107 82  BILITOT 0.4  --   --   PROT 6.0*  --   --   ALBUMIN 2.9*  --   --    No results for input(s): LIPASE, AMYLASE in the last 8760 hours. No results for input(s): AMMONIA in the last 8760 hours. CBC:  Recent Labs  02/27/16 0646 02/28/16 0152 08/15/16 09/08/16  WBC 13.4* 7.7 5.5 5.4  HGB 14.4 13.6 14.5 15.3  HCT 44.2 41.7 44 45  MCV 90.4 90.7  --   --   PLT 173 140* 160 168     Assessment/Plan  Rectal bleed Likely from internal hemorrhoids. Denies further constipation. Recently underwent colonoscopy that was normal.monitor clinically.  Stable h&h on review. On preparation H suppository as needed.   Osteoporosis continue with prolia q6 month and monitor  Depression Mood has been stable, off zoloft now. Monitor his mood.     Family/ staff Communication: reviewed care plan with patient and nursing supervisor    Blanchie Serve, MD Internal Medicine [place],  [address] Cell Phone (Monday-Friday 8 am - 5 pm): [phone] On Call: [phone] and follow prompts after 5 pm and on weekends Office Phone: [phone] Office Fax: [phone]

## 2016-10-12 ENCOUNTER — Non-Acute Institutional Stay (SKILLED_NURSING_FACILITY): Payer: Medicare Other

## 2016-10-12 DIAGNOSIS — Z0000 Encounter for general adult medical examination without abnormal findings: Secondary | ICD-10-CM | POA: Diagnosis not present

## 2016-10-12 NOTE — Patient Instructions (Signed)
[facility] , Thank you for taking time to come for your Medicare Wellness Visit. I appreciate your ongoing commitment to your health goals. Please review the following plan we discussed and let me know if I can assist you in the future.   Screening recommendations/referrals: Colonoscopy- long term pt Recommended yearly ophthalmology/optometry visit for glaucoma screening and checkup Recommended yearly dental visit for hygiene and checkup  Vaccinations: Influenza vaccine due 02/23/2017 Pneumococcal vaccine 23 ordered. Tdap vaccine not in records Shingles vaccine not in records  Advanced directives: In chart  Conditions/risks identified: none  Next appointment: none upcoming  Preventive Care 74 Years and Older, Male Preventive care refers to lifestyle choices and visits with your health care provider that can promote health and wellness. What does preventive care include?  A yearly physical exam. This is also called an annual well check.  Dental exams once or twice a year.  Routine eye exams. Ask your health care provider how often you should have your eyes checked.  Personal lifestyle choices, including:  Daily care of your teeth and gums.  Regular physical activity.  Eating a healthy diet.  Avoiding tobacco and drug use.  Limiting alcohol use.  Practicing safe sex.  Taking low doses of aspirin every day.  Taking vitamin and mineral supplements as recommended by your health care provider. What happens during an annual well check? The services and screenings done by your health care provider during your annual well check will depend on your age, overall health, lifestyle risk factors, and family history of disease. Counseling  Your health care provider may ask you questions about your:  Alcohol use.  Tobacco use.  Drug use.  Emotional well-being.  Home and relationship well-being.  Sexual activity.  Eating habits.  History of falls.  Memory and ability  to understand (cognition).  Work and work Statistician. Screening  You may have the following tests or measurements:  Height, weight, and BMI.  Blood pressure.  Lipid and cholesterol levels. These may be checked every 5 years, or more frequently if you are over 37 years old.  Skin check.  Lung cancer screening. You may have this screening every year starting at age 39 if you have a 30-pack-year history of smoking and currently smoke or have quit within the past 15 years.  Fecal occult blood test (FOBT) of the stool. You may have this test every year starting at age 12.  Flexible sigmoidoscopy or colonoscopy. You may have a sigmoidoscopy every 5 years or a colonoscopy every 10 years starting at age 59.  Prostate cancer screening. Recommendations will vary depending on your family history and other risks.  Hepatitis C blood test.  Hepatitis B blood test.  Sexually transmitted disease (STD) testing.  Diabetes screening. This is done by checking your blood sugar (glucose) after you have not eaten for a while (fasting). You may have this done every 1-3 years.  Abdominal aortic aneurysm (AAA) screening. You may need this if you are a current or former smoker.  Osteoporosis. You may be screened starting at age 73 if you are at high risk. Talk with your health care provider about your test results, treatment options, and if necessary, the need for more tests. Vaccines  Your health care provider may recommend certain vaccines, such as:  Influenza vaccine. This is recommended every year.  Tetanus, diphtheria, and acellular pertussis (Tdap, Td) vaccine. You may need a Td booster every 10 years.  Zoster vaccine. You may need this after age 52.  Pneumococcal 13-valent conjugate (PCV13) vaccine. One dose is recommended after age 76.  Pneumococcal polysaccharide (PPSV23) vaccine. One dose is recommended after age 18. Talk to your health care provider about which screenings and vaccines  you need and how often you need them. This information is not intended to replace advice given to you by your health care provider. Make sure you discuss any questions you have with your health care provider. Document Released: 06/04/2015 Document Revised: 01/26/2016 Document Reviewed: 03/09/2015 Elsevier Interactive Patient Education  2017 [place] [employer] Prevention in the Home Falls can cause injuries. They can happen to people of all ages. There are many things you can do to make your home safe and to help prevent falls. What can I do on the outside of my home?  Regularly fix the edges of walkways and driveways and fix any cracks.  Remove anything that might make you trip as you walk through a door, such as a raised step or threshold.  Trim any bushes or trees on the path to your home.  Use bright outdoor lighting.  Clear any walking paths of anything that might make someone trip, such as rocks or tools.  Regularly check to see if handrails are loose or broken. Make sure that both sides of any steps have handrails.  Any raised decks and porches should have guardrails on the edges.  Have any leaves, snow, or ice cleared regularly.  Use sand or salt on walking paths during winter.  Clean up any spills in your garage right away. This includes oil or grease spills. What can I do in the bathroom?  Use night lights.  Install grab bars by the toilet and in the tub and shower. Do not use towel bars as grab bars.  Use non-skid mats or decals in the tub or shower.  If you need to sit down in the shower, use a plastic, non-slip stool.  Keep the floor dry. Clean up any water that spills on the floor as soon as it happens.  Remove soap buildup in the tub or shower regularly.  Attach bath mats securely with double-sided non-slip rug tape.  Do not have throw rugs and other things on the floor that can make you trip. What can I do in the bedroom?  Use night lights.  Make sure  that you have a light by your bed that is easy to reach.  Do not use any sheets or blankets that are too big for your bed. They should not hang down onto the floor.  Have a firm chair that has side arms. You can use this for support while you get dressed.  Do not have throw rugs and other things on the floor that can make you trip. What can I do in the kitchen?  Clean up any spills right away.  Avoid walking on wet floors.  Keep items that you use a lot in easy-to-reach places.  If you need to reach something above you, use a strong step stool that has a grab bar.  Keep electrical cords out of the way.  Do not use floor polish or wax that makes floors slippery. If you must use wax, use non-skid floor wax.  Do not have throw rugs and other things on the floor that can make you trip. What can I do with my stairs?  Do not leave any items on the stairs.  Make sure that there are handrails on both sides of the stairs and use them.  Fix handrails that are broken or loose. Make sure that handrails are as long as the stairways.  Check any carpeting to make sure that it is firmly attached to the stairs. Fix any carpet that is loose or worn.  Avoid having throw rugs at the top or bottom of the stairs. If you do have throw rugs, attach them to the floor with carpet tape.  Make sure that you have a light switch at the top of the stairs and the bottom of the stairs. If you do not have them, ask someone to add them for you. What else can I do to help prevent falls?  Wear shoes that:  Do not have high heels.  Have rubber bottoms.  Are comfortable and fit you well.  Are closed at the toe. Do not wear sandals.  If you use a stepladder:  Make sure that it is fully opened. Do not climb a closed stepladder.  Make sure that both sides of the stepladder are locked into place.  Ask someone to hold it for you, if possible.  Clearly mark and make sure that you can see:  Any grab bars or  handrails.  First and last steps.  Where the edge of each step is.  Use tools that help you move around (mobility aids) if they are needed. These include:  Canes.  Walkers.  Scooters.  Crutches.  Turn on the lights when you go into a dark area. Replace any light bulbs as soon as they burn out.  Set up your furniture so you have a clear path. Avoid moving your furniture around.  If any of your floors are uneven, fix them.  If there are any pets around you, be aware of where they are.  Review your medicines with your doctor. Some medicines can make you feel dizzy. This can increase your chance of falling. Ask your doctor what other things that you can do to help prevent falls. This information is not intended to replace advice given to you by your health care provider. Make sure you discuss any questions you have with your health care provider. Document Released: 03/04/2009 Document Revised: 10/14/2015 Document Reviewed: 06/12/2014 Elsevier Interactive Patient Education  2017 [employer].

## 2016-10-12 NOTE — Progress Notes (Signed)
Subjective:   Gregory Kerr is a 76 y.o. male who presents for an Initial Medicare Annual Wellness Visit at [facility]        Objective:    Today's Vitals   10/12/16 1453 10/12/16 1454  BP: 132/78   Pulse: 65   Temp: 98.1 F (36.7 C)   TempSrc: Oral   SpO2: 97%   Weight: 166 lb (75.3 kg)   Height: 5\' 9"  (1.753 m)   PainSc:  10-Worst pain ever   Body mass index is 24.51 kg/m.  Current Medications (verified) Outpatient Encounter Prescriptions as of 10/12/2016  Medication Sig  . acetaminophen (TYLENOL) 325 MG tablet Take 650 mg by mouth every 4 (four) hours as needed for mild pain or headache.  . Amino Acids-Protein Hydrolys (FEEDING SUPPLEMENT, PRO-STAT SUGAR FREE 64,) LIQD Take 30 mLs by mouth daily. Stop date 09/29/16  . baclofen (LIORESAL) 10 MG tablet Take one tablet by mouth four times daily for muscle spasm  . calcium carbonate (OS-CAL - DOSED IN MG OF ELEMENTAL CALCIUM) 1250 MG tablet Take 1 tablet by mouth 2 (two) times daily with a meal. 500mg  elemental  . Cholecalciferol 2000 units TABS Take 2,000 Units by mouth daily.  Marland Kitchen denosumab (PROLIA) 60 MG/ML SOLN injection Inject 60 mg into the skin every 6 (six) months. Administer in upper arm, thigh, or abdomen  . Dextromethorphan-Quinidine (NUEDEXTA) 20-10 MG CAPS Take one capsule by mouth once daily for PBA  . dipyridamole-aspirin (AGGRENOX) 200-25 MG per 12 hr capsule Take one capsule by mouth twice daily for CVA  . furosemide (LASIX) 20 MG tablet Take 10 mg by mouth daily.  Marland Kitchen gabapentin (NEURONTIN) 100 MG capsule Take 100 mg by mouth 3 (three) times daily.  Marland Kitchen ipratropium (ATROVENT) 0.03 % nasal spray Place 2 sprays into both nostrils 3 (three) times daily.  . metoprolol tartrate (LOPRESSOR) 25 MG tablet Take 0.5 tablets (12.5 mg total) by mouth 2 (two) times daily.  . nitroGLYCERIN (NITROSTAT) 0.4 MG SL tablet Place 0.4 mg under the tongue every 5 (five) minutes as needed for chest pain.  Marland Kitchen oxycodone  (OXY-IR) 5 MG capsule Take one tablet by mouth every 6 hours as needed for pain  . pantoprazole (PROTONIX) 40 MG tablet Take 40 mg by mouth 2 (two) times daily before a meal.  . polyethylene glycol (MIRALAX / GLYCOLAX) packet Take 17 g by mouth daily as needed.   . Prenatal Vit-Fe Fumarate-FA (PREPLUS) 27-1 MG TABS Take 1 tablet by mouth 2 (two) times daily.  Marland Kitchen saccharomyces boulardii (FLORASTOR) 250 MG capsule Take 250 mg by mouth daily.   . shark liver oil-cocoa butter (PREPARATION H) 0.25-3-85.5 % suppository Place 1 suppository rectally daily as needed for hemorrhoids.  . simvastatin (ZOCOR) 20 MG tablet Take 20 mg by mouth at bedtime.   No facility-administered encounter medications on file as of 10/12/2016.     Allergies (verified) Penicillins   History: Past Medical History:  Diagnosis Date  . GERD (gastroesophageal reflux disease)   . Hernia    "large one; on my left stomach" (09/22/2014)  . Hiatal hernia 10/24/2012  . HTN (hypertension) 10/24/2012  . Hyperlipidemia   . Hypertension   . 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 Osteoporosis, unspecified 10/24/2012  . 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 Pneumonia    "once or twice" (09/22/2014)  . Stroke [facility]) 2007   "left side doesn't work now" (09/22/2014)   Past Surgical History:  Procedure Laterality Date  .  APPENDECTOMY  05/27/2009   Ronnald Collum, MD  . CATARACT EXTRACTION W/ INTRAOCULAR LENS  IMPLANT, BILATERAL Bilateral ?2014  . CHOLECYSTECTOMY N/A 09/26/2014   Procedure: LAPAROSCOPIC CHOLECYSTECTOMY WITH INTRAOPERATIVE CHOLANGIOGRAM;  Surgeon: Donnie Mesa, MD;  Location: [facility];  Service: General;  Laterality: N/A;  . COLONOSCOPY WITH PROPOFOL N/A 12/29/2015   Procedure: COLONOSCOPY WITH PROPOFOL;  Surgeon: Wilford Corner, MD;  Location: [facility] ENDOSCOPY;  Service: Endoscopy;  Laterality: N/A;  . ERCP N/A 09/25/2014   Procedure: ENDOSCOPIC RETROGRADE CHOLANGIOPANCREATOGRAPHY (ERCP);  Surgeon: Clarene Essex, MD;  Location: [facility] ENDOSCOPY;   Service: Endoscopy;  Laterality: N/A;   Family History  Problem Relation Age of Onset  . Heart attack Mother   . Heart attack Father   . Cancer Maternal Aunt        Stomach  . Cancer Paternal Aunt        Uterine,Colon   Social History   Occupational History  . Not on file.   Social History Main Topics  . Smoking status: Former Smoker    Packs/day: 1.00    Types: Cigarettes    Quit date: 12/20/2005  . Smokeless tobacco: Never Used  . Alcohol use Yes     Comment: 09/22/2014 "stopped in 2007; drank occasionally when I did drink"  . Drug use: No  . Sexual activity: No   Tobacco Counseling Counseling given: Not Answered   Activities of Daily Living In your present state of health, do you have any difficulty performing the following activities: 10/12/2016 02/27/2016  Hearing? N N  Vision? N N  Difficulty concentrating or making decisions? N N  Walking or climbing stairs? Y Y  Dressing or bathing? N Y  Doing errands, shopping? Y N  Preparing Food and eating ? N -  Using the Toilet? N -  In the past six months, have you accidently leaked urine? N -  Do you have problems with loss of bowel control? N -  Managing your Medications? Y -  Managing your Finances? Y -  Housekeeping or managing your Housekeeping? Y -  Some recent data might be hidden    Immunizations and Health Maintenance Immunization History  Administered Date(s) Administered  . Influenza-Unspecified 03/25/2013, 02/19/2014, 03/03/2014, 03/03/2015, 02/24/2016  . PPD Test 01/31/2013, 02/02/2014, 01/26/2015  . Pneumococcal-Unspecified 03/30/2011   Health Maintenance Due  Topic Date Due  . PNA vac Low Risk Adult (2 of 2 - PCV13) 03/29/2012    Patient Care Team: Blanchie Serve, MD as PCP - General (Internal Medicine)  Indicate any recent Medical Services you may have received from other than [facility] providers in the past year (date may be approximate).    Assessment:   This is a routine wellness examination for  Gregory Kerr.   Hearing/Vision screen No exam data present  Dietary issues and exercise activities discussed: Current Exercise Habits: Home exercise routine, Type of exercise: stretching, Time (Minutes): 10, Frequency (Times/Week): 2, Weekly Exercise (Minutes/Week): 20, Intensity: Mild, Exercise limited by: None identified  Goals    . Maintain Lifestyle          Starting today pt will maintain lifestyle.       Depression Screen PHQ 2/9 Scores 10/12/2016  PHQ - 2 Score 0    Fall Risk Fall Risk  10/12/2016  Falls in the past year? No    Cognitive Function:     6CIT Screen 10/12/2016  What Year? 0 points  What month? 0 points  What time? 0 points  Count back from 20 0  points  Months in reverse 0 points  Repeat phrase 2 points  Total Score 2    Screening Tests Health Maintenance  Topic Date Due  . PNA vac Low Risk Adult (2 of 2 - PCV13) 03/29/2012  . TETANUS/TDAP  05/23/2023 (Originally 05/11/1960)  . INFLUENZA VACCINE  12/20/2016  . COLONOSCOPY  12/28/2025        Plan:    I have personally reviewed and addressed the Medicare Annual Wellness questionnaire and have noted the following in the patient's chart:  A. Medical and social history B. Use of alcohol, tobacco or illicit drugs  C. Current medications and supplements D. Functional ability and status E.  Nutritional status F.  Physical activity G. Advance directives H. List of other physicians I.  Hospitalizations, surgeries, and ER visits in previous 12 months J.  [place] to include hearing, vision, cognitive, depression L. Referrals and appointments - none  In addition, I have reviewed and discussed with patient certain preventive protocols, quality metrics, and best practice recommendations. A written personalized care plan for preventive services as well as general preventive health recommendations were provided to patient.  See attached scanned questionnaire for additional information.    Signed,   Rich Reining, RN Nurse Health Advisor

## 2016-10-12 NOTE — Progress Notes (Signed)
Quick Notes   Health Maintenance: PN 23 ordered     Abnormal Screen: 6 CIT-2     Patient Concerns: None     Nurse Concerns: None

## 2017-02-12 ENCOUNTER — Other Ambulatory Visit: Payer: Self-pay | Admitting: Surgery

## 2017-02-12 DIAGNOSIS — I712 Thoracic aortic aneurysm, without rupture, unspecified: Secondary | ICD-10-CM

## 2017-02-28 IMAGING — CT CT ABD-PELV W/ CM
2 of 5 series · 15 of 46 positions shown, 17 images · IV contrast (APPLIED)
Comparison: 04/11/2012

CLINICAL DATA: Abdominal pain, left side abdominal pain, history of
hiatal hernia

EXAM:
CT ABDOMEN AND PELVIS WITH CONTRAST
TECHNIQUE: Multidetector CT imaging of the abdomen and pelvis was performed
using the standard protocol following bolus administration of
intravenous contrast.
CONTRAST:  80mL OMNIPAQUE IOHEXOL 300 MG/ML  SOLN

[Series 2: abd/ pelvis 5.0 i30f 1 · axial · 0.80mm/px · z∈[+742,+1187]mm · 12 of 99 slices shown, 14 images]
[im 5/99  soft-tissue]
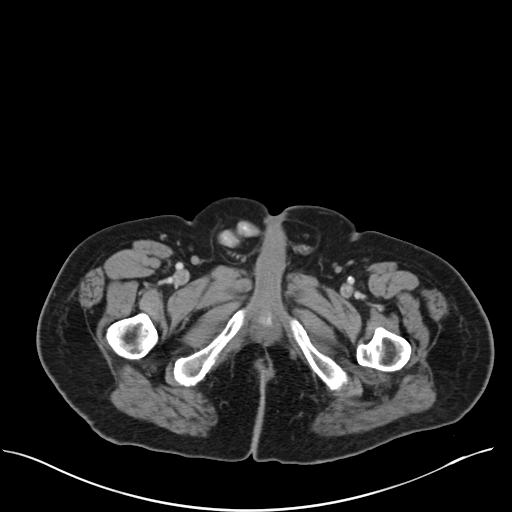
[im 5/99  bone]
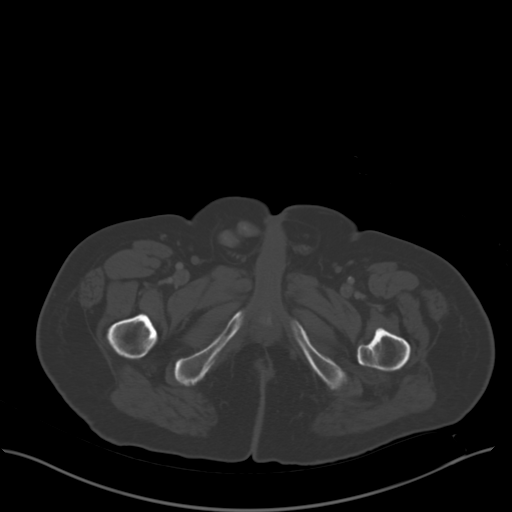
[im 15/99  soft-tissue]
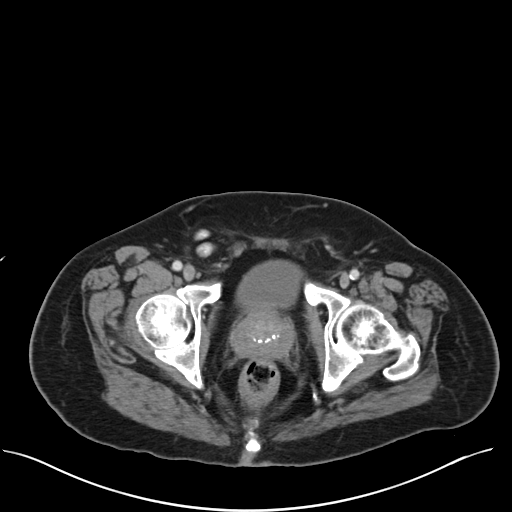
[im 20/99  soft-tissue]
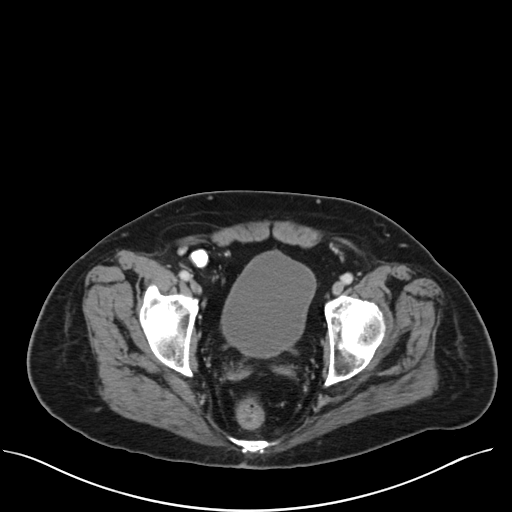
[im 30/99  soft-tissue]
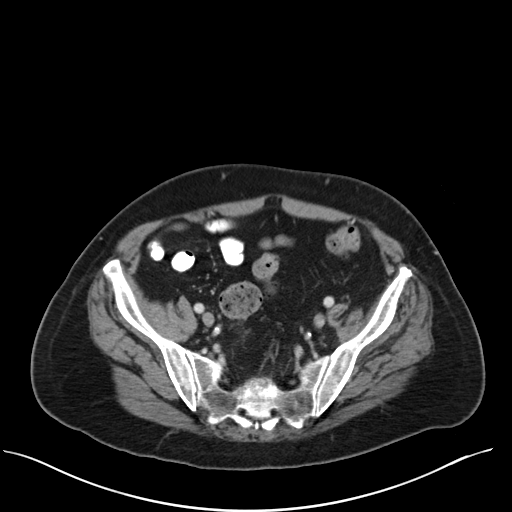
[im 40/99  soft-tissue]
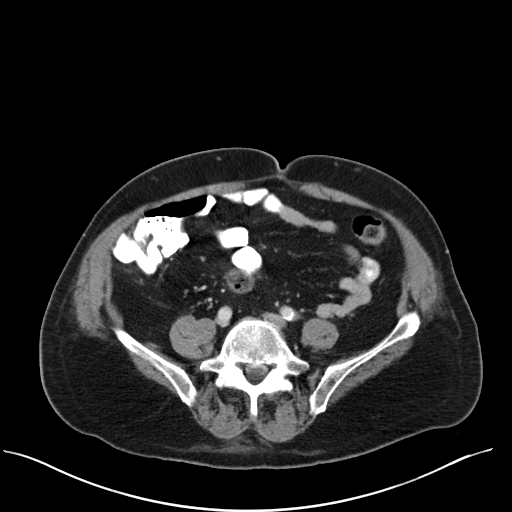
[im 45/99  soft-tissue]
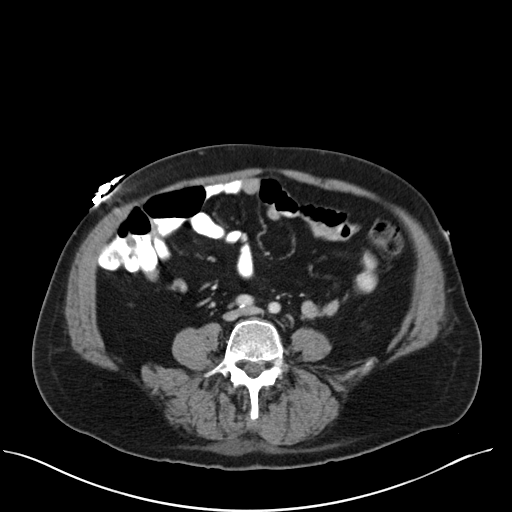
[im 54/99  soft-tissue]
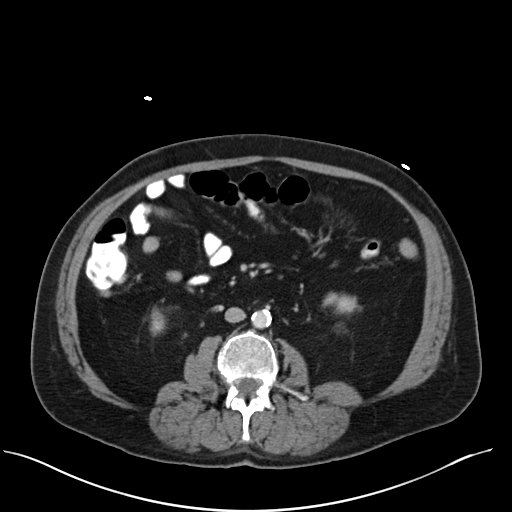
[im 59/99  soft-tissue]
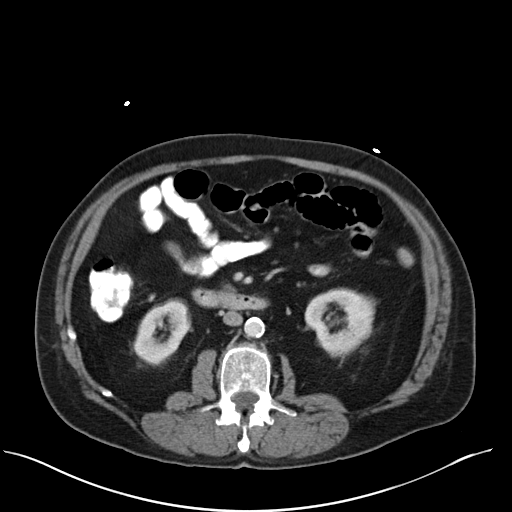
[im 69/99  soft-tissue]
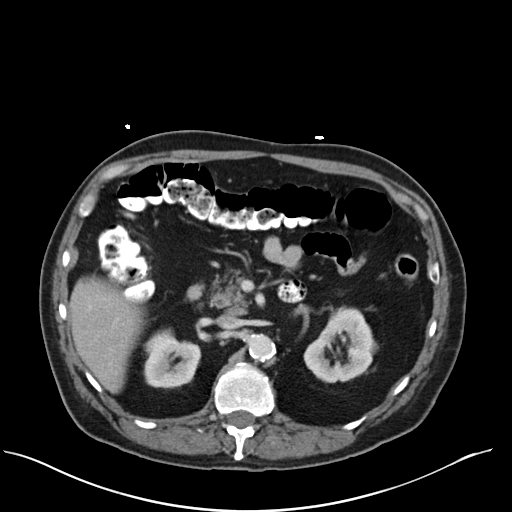
[im 69/99  bone]
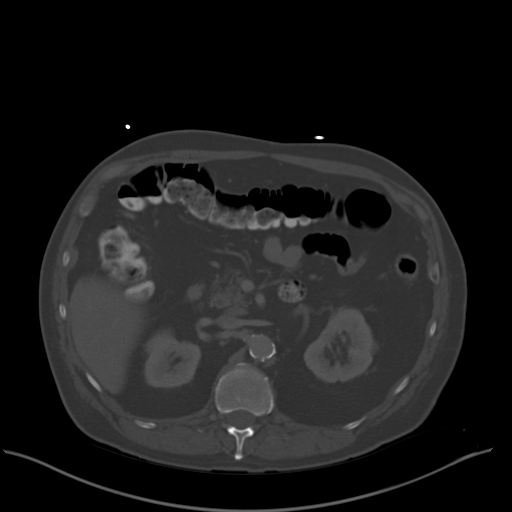
[im 79/99  soft-tissue]
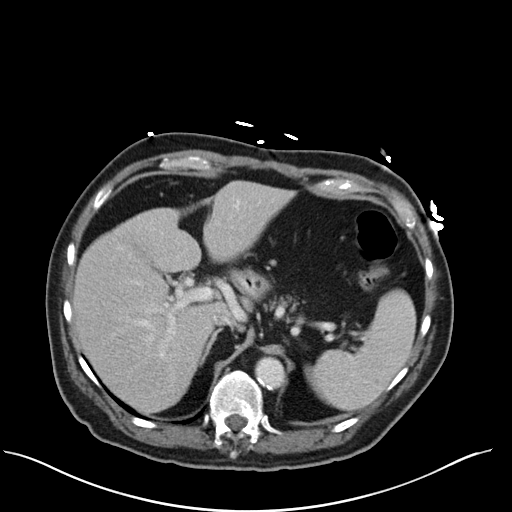
[im 84/99  soft-tissue]
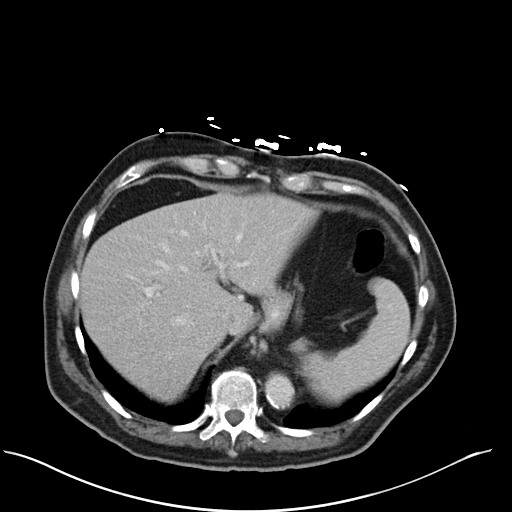
[im 94/99  soft-tissue]
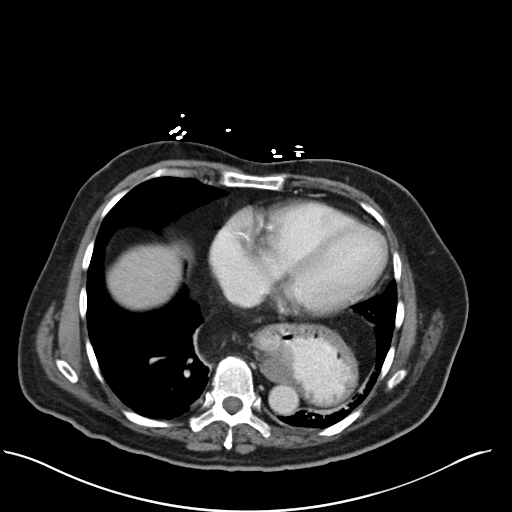

[Series 5: coronal soft tissue · coronal · 0.90mm/px · 3 of 82 slices shown]
[im 28/82  soft-tissue]
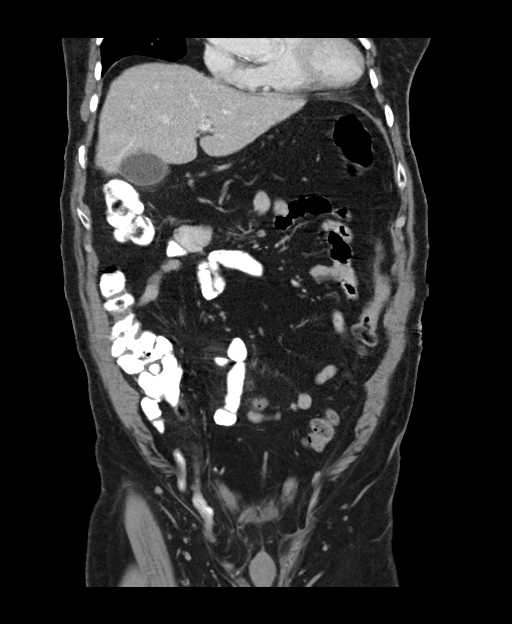
[im 37/82  soft-tissue]
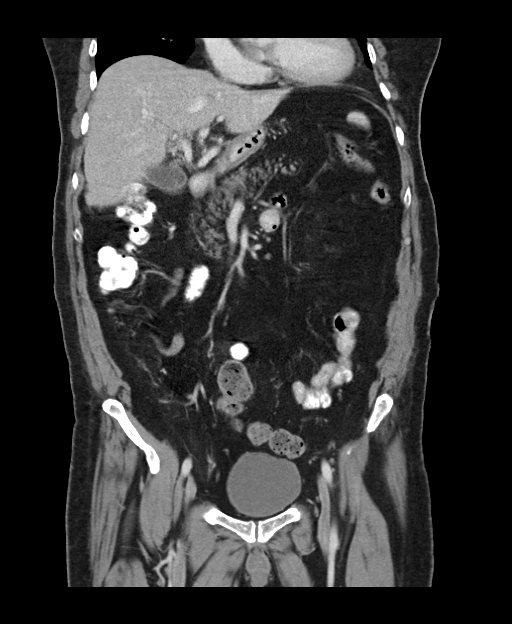
[im 46/82  soft-tissue]
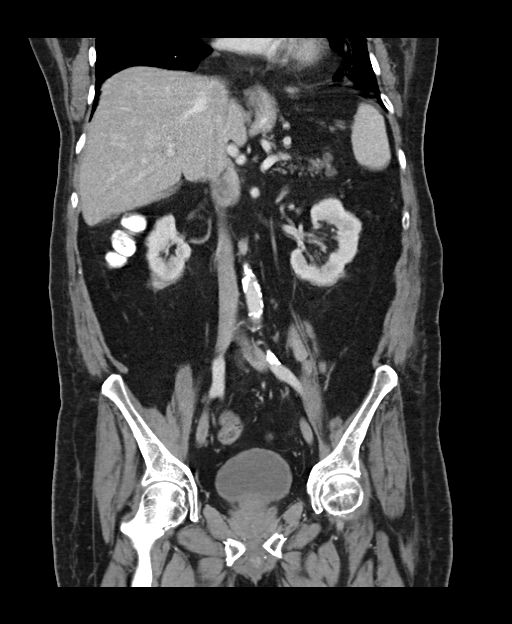

[15 of 46 positions shown; findings below may reference images not displayed]

FINDINGS: There is a large hiatal hernia measuring 10.5 x 8 cm with at least
[DATE] [REDACTED] of proximal stomach in left lower chest retrocardiac
position. There is no evidence of acute gastric volvulus. No gastric
outlet obstruction.

Mild hepatic fatty infiltration. No focal hepatic mass. Small
gallstones are noted in gallbladder neck region the largest measures
6.5 mm. Borderline thickening of gallbladder wall up to 2.9 mm
without evidence of pericholecystic fluid. Again noted mild
pancreatic atrophy with fatty replacement. The spleen and adrenal
glands are unremarkable. Again noted lobulated renal contour. No
hydronephrosis or hydroureter. Cystic lesion along the uncinate
process of the pancreas again noted measures 1.7 cm stable in size
in appearance from prior exam. In a small portal caval lymph node
measures 1.2 by 0.9 cm stable in size in appearance from prior exam.

Atherosclerotic calcifications of abdominal aorta and iliac
arteries. No aortic aneurysm.

There is a cyst in midpole of the right kidney anterolateral aspect
measures 1.2 cm. Delayed renal images shows bilateral renal
symmetrical excretion. A cyst in upper pole of the right kidney
posterior aspect measures 1 cm.

Bilateral visualized proximal ureter is unremarkable.

No small bowel obstruction. No ascites or free air. No adenopathy.
Oral contrast material is noted in distal small bowel terminal ileum
and right colon. There is no pericecal inflammation. The terminal
ileum is unremarkable. The patient is status post appendectomy.
There is some stool in distal left colon and sigmoid colon. No
evidence of colonic obstruction.

There is a right inguinal scrotal canal hernia containing fat and a
loop of terminal small bowel measures 4.4 cm without evidence of
small bowel obstruction. There is a left inguinal scrotal canal
hernia containing fat measures 3.5 cm without evidence of acute
complication. Prostate gland calcifications are noted. Mild enlarged
prostate gland measures 4.9 by 3.5 cm. The urinary bladder is
unremarkable.
IMPRESSION: 1. Again noted large hiatal hernia with at least [DATE] of proximal
stomach in left lower chest retrocardiac position. There is no
evidence of gastric volvulus or gastric outlet obstruction.
2. No small bowel or colonic obstruction.
3. Again noted gallstones within gallbladder. Borderline thickening
of gallbladder wall up to 2.9 mm. No evidence of pericholecystic
fluid.
4. Again noted atrophic pancreas with partial fatty replacement.
Stable cystic lesion along the uncinate process of the pancreas.
5. No small bowel obstruction. Again noted right inguinal scrotal
hernia containing small bowel without evidence of acute complication
or small bowel obstruction. Again noted a left inguinal scrotal
canal hernia containing fat without evidence of acute complication.
6. Again noted mild enlarged prostate gland.
7. Mild hepatic fatty infiltration.
8. No hydronephrosis or hydroureter.  Right renal cysts are noted

## 2017-03-14 ENCOUNTER — Ambulatory Visit
Admission: RE | Admit: 2017-03-14 | Discharge: 2017-03-14 | Disposition: A | Discharge status: Home / Self Care | Payer: Medicare Other | Source: Ambulatory Visit | Attending: Surgery | Admitting: Surgery

## 2017-03-14 ENCOUNTER — Encounter: Payer: Self-pay | Admitting: Surgery

## 2017-03-14 ENCOUNTER — Ambulatory Visit (INDEPENDENT_AMBULATORY_CARE_PROVIDER_SITE_OTHER): Payer: Medicare Other | Admitting: Surgery

## 2017-03-14 VITALS — BP 146/74 | HR 62 | Ht 69.0 in | Wt 160.0 lb

## 2017-03-14 DIAGNOSIS — I712 Thoracic aortic aneurysm, without rupture, unspecified: Secondary | ICD-10-CM

## 2017-03-14 MED ORDER — IOPAMIDOL (ISOVUE-370) INJECTION 76%
75.0000 mL | Freq: Once | INTRAVENOUS | Status: AC | PRN
  Administered 2017-03-14: 75 mL via INTRAVENOUS

## 2017-03-14 NOTE — Progress Notes (Signed)
HPI:  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 Since I last saw him a year ago he says that he feels well and has had no changes in his medical condition.  Current Outpatient Prescriptions  Medication Sig Dispense Refill  . acetaminophen (TYLENOL) 325 MG tablet Take 650 mg by mouth every 4 (four) hours as needed for mild pain or headache.    . Amino Acids-Protein Hydrolys (FEEDING SUPPLEMENT, PRO-STAT SUGAR FREE 64,) LIQD Take 30 mLs by mouth daily. Stop date 09/29/16    . baclofen (LIORESAL) 10 MG tablet Take one tablet by mouth four times daily for muscle spasm    . calcium carbonate (OS-CAL - DOSED IN MG OF ELEMENTAL CALCIUM) 1250 MG tablet Take 1 tablet by mouth 2 (two) times daily with a meal. 500mg  elemental    . Cholecalciferol 2000 units TABS Take 2,000 Units by mouth daily.    Marland Kitchen denosumab (PROLIA) 60 MG/ML SOLN injection Inject 60 mg into the skin every 6 (six) months. Administer in upper arm, thigh, or abdomen    . Dextromethorphan-Quinidine (NUEDEXTA) 20-10 MG CAPS Take one capsule by mouth once daily for PBA    . dipyridamole-aspirin (AGGRENOX) 200-25 MG per 12 hr capsule Take one capsule by mouth twice daily for CVA    . furosemide (LASIX) 20 MG tablet Take 10 mg by mouth daily.    Marland Kitchen gabapentin (NEURONTIN) 100 MG capsule Take 100 mg by mouth 3 (three) times daily.    Marland Kitchen ipratropium (ATROVENT) 0.03 % nasal spray Place 2 sprays into both nostrils 3 (three) times daily.    . metoprolol tartrate (LOPRESSOR) 25 MG tablet Take 0.5 tablets (12.5 mg total) by mouth 2 (two) times daily.    . nitroGLYCERIN (NITROSTAT) 0.4 MG SL tablet Place 0.4 mg under the tongue every 5 (five) minutes as needed for chest pain.    Marland Kitchen oxycodone (OXY-IR) 5 MG capsule Take one tablet by  mouth every 6 hours as needed for pain 120 capsule 0  . pantoprazole (PROTONIX) 40 MG tablet Take 40 mg by mouth 2 (two) times daily before a meal.    . polyethylene glycol (MIRALAX / GLYCOLAX) packet Take 17 g by mouth daily as needed.     . Prenatal Vit-Fe Fumarate-FA (PREPLUS) 27-1 MG TABS Take 1 tablet by mouth 2 (two) times daily.    Marland Kitchen saccharomyces boulardii (FLORASTOR) 250 MG capsule Take 250 mg by mouth daily.     . shark liver oil-cocoa butter (PREPARATION H) 0.25-3-85.5 % suppository Place 1 suppository rectally daily as needed for hemorrhoids.    . simvastatin (ZOCOR) 20 MG tablet Take 20 mg by mouth at bedtime.     No current facility-administered medications for this visit.      Physical Exam: BP (!) 146/74   Pulse 62   Ht 5\' 9"  (1.753 m)   Wt 160 lb (72.6 kg) Comment: per patient  SpO2 94%   BMI 23.63 kg/m  He is in a wheel chair with spastic left hemiplegia Alert and conversant Cardiac exam shows a regular rate and rhythm with normal heart sounds Lungs are clear  Diagnostic Tests:  CLINICAL DATA:  Followup of aneurysmal disease of the ascending thoracic aorta.  EXAM: CT ANGIOGRAPHY CHEST WITH CONTRAST  TECHNIQUE: Multidetector CT imaging of the chest was performed using the standard protocol during bolus administration of intravenous contrast. Multiplanar CT image reconstructions and MIPs were obtained to evaluate the vascular anatomy.  CONTRAST:  75 mL Isovue 370 IV  Creatinine was obtained on site at [place] at [address]  Results: Creatinine 0.8 mg/dL.  Estimated GFR 89 mL/minute.  COMPARISON:  02/27/2016  FINDINGS: Cardiovascular: There is stable aneurysmal disease of the ascending thoracic aorta. The aorta measures approximately 3.7-3.9 cm at the level of the sinuses of Valsalva. The ascending tubular portion measures approximately 4.4 cm in maximum diameter. Previous additional transverse measurement of 4.8 cm is  felt to be inaccurate based on axis of measurement. The proximal arch measures 3.7 cm, the distal arch measures 2.7 cm and the descending thoracic aorta measures 2.2 cm.  No evidence of aneurysm rupture or aortic dissection. Proximal great vessels are normally patent and demonstrate stable tortuosity. The heart is mildly enlarged. Calcified plaque is present in the distribution of the LAD and RCA.  Mediastinum/Nodes: No enlarged mediastinal, hilar, or axillary lymph nodes. Thyroid gland, trachea, and esophagus demonstrate no significant findings. Stable large hiatal hernia.  Lungs/Pleura: Lungs show chronic bibasilar atelectasis and pulmonary venous hypertensive changes without overt airspace edema. No airspace consolidation, pleural fluid or pulmonary nodule identified.  Upper Abdomen: No acute abnormality.  Musculoskeletal: No chest wall abnormality. No acute or significant osseous findings.  Review of the MIP images confirms the above findings.  IMPRESSION: 1. No change in mild aneurysmal dilatation of the ascending thoracic aorta. Accurate maximal diameter is felt to be 4.4 cm. 2. Coronary atherosclerosis with calcified plaque in the distribution of the LAD and RCA. 3. Stable large hiatal hernia.  Aortic aneurysm NOS (ICD10-I71.9).   Electronically Signed   By: Aletta Edouard M.D.   On: 03/14/2017 15:30  Impression:  He has a stable ascending aortic aneurysm with a diameter of 4.4 cm by CTA today. This does not require surgical treatment at this size. I stressed the importance of good BP control. His BP is mildly elevated today but he says that it is usually normal. He is on a beta blocker. I reviewed the images with him and his family and answered their questions.  Plan:  I will see him back in one year with a CTA of the chest   I spent 15 minutes performing this established patient evaluation and > 50% of this time was spent face to face counseling  and coordinating the care of this patient's aortic aneurysm.    Gaye Pollack, MD [facility] [phone]

## 2018-01-30 ENCOUNTER — Other Ambulatory Visit: Payer: Self-pay | Admitting: Surgery

## 2018-01-30 DIAGNOSIS — I712 Thoracic aortic aneurysm, without rupture, unspecified: Secondary | ICD-10-CM

## 2018-01-31 ENCOUNTER — Other Ambulatory Visit: Payer: Self-pay | Admitting: Surgery

## 2018-03-13 ENCOUNTER — Ambulatory Visit: Payer: Medicare Other | Admitting: Surgery

## 2018-03-13 ENCOUNTER — Inpatient Hospital Stay: Admission: RE | Admit: 2018-03-13 | Payer: Medicare Other | Source: Ambulatory Visit

## 2018-03-20 ENCOUNTER — Ambulatory Visit: Payer: Medicare Other | Admitting: Surgery

## 2018-04-25 ENCOUNTER — Ambulatory Visit
Admission: RE | Admit: 2018-04-25 | Discharge: 2018-04-25 | Disposition: A | Discharge status: Home / Self Care | Payer: Medicare Other | Source: Ambulatory Visit | Attending: Surgery | Admitting: Surgery

## 2018-04-25 DIAGNOSIS — I712 Thoracic aortic aneurysm, without rupture, unspecified: Secondary | ICD-10-CM

## 2018-04-25 MED ORDER — IOPAMIDOL (ISOVUE-370) INJECTION 76%
75.0000 mL | Freq: Once | INTRAVENOUS | Status: AC | PRN
  Administered 2018-04-25: 75 mL via INTRAVENOUS

## 2018-05-01 ENCOUNTER — Ambulatory Visit (INDEPENDENT_AMBULATORY_CARE_PROVIDER_SITE_OTHER): Payer: Medicare Other | Admitting: Surgery

## 2018-05-01 ENCOUNTER — Encounter: Payer: Self-pay | Admitting: Surgery

## 2018-05-01 VITALS — BP 120/56 | HR 60 | Resp 20 | Ht 69.0 in | Wt 165.0 lb

## 2018-05-01 DIAGNOSIS — I712 Thoracic aortic aneurysm, without rupture, unspecified: Secondary | ICD-10-CM

## 2018-05-01 NOTE — Progress Notes (Signed)
HPI:  The patient returns for follow up of a 4.4 cm fusiform ascending aortic aneurysm which has been stable by CT dating back to 03/2014. 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 Since I last saw him a year ago he says that he feels well and has had no changes in his medical condition.  Current Outpatient Medications  Medication Sig Dispense Refill  . acetaminophen (TYLENOL) 325 MG tablet Take 650 mg by mouth every 4 (four) hours as needed for mild pain or headache.    . Amino Acids-Protein Hydrolys (FEEDING SUPPLEMENT, PRO-STAT SUGAR FREE 64,) LIQD Take 30 mLs by mouth daily. Stop date 09/29/16    . baclofen (LIORESAL) 10 MG tablet Take one tablet by mouth four times daily for muscle spasm    . calcium carbonate (OS-CAL - DOSED IN MG OF ELEMENTAL CALCIUM) 1250 MG tablet Take 1 tablet by mouth 2 (two) times daily with a meal. 500mg  elemental    . Cholecalciferol 2000 units TABS Take 2,000 Units by mouth daily.    Marland Kitchen denosumab (PROLIA) 60 MG/ML SOLN injection Inject 60 mg into the skin every 6 (six) months. Administer in upper arm, thigh, or abdomen    . Dextromethorphan-Quinidine (NUEDEXTA) 20-10 MG CAPS Take one capsule by mouth once daily for PBA    . dipyridamole-aspirin (AGGRENOX) 200-25 MG per 12 hr capsule Take one capsule by mouth twice daily for CVA    . furosemide (LASIX) 20 MG tablet Take 10 mg by mouth daily.    Marland Kitchen gabapentin (NEURONTIN) 100 MG capsule Take 100 mg by mouth 3 (three) times daily.    Marland Kitchen ipratropium (ATROVENT) 0.03 % nasal spray Place 2 sprays into both nostrils 3 (three) times daily.    . metoprolol tartrate (LOPRESSOR) 25 MG tablet Take 0.5 tablets (12.5 mg total) by mouth 2 (two) times daily.    . nitroGLYCERIN (NITROSTAT) 0.4 MG SL tablet Place 0.4 mg under the tongue every 5 (five) minutes as needed for chest pain.    Marland Kitchen oxycodone (OXY-IR) 5 MG capsule Take one tablet by mouth  every 6 hours as needed for pain 120 capsule 0  . pantoprazole (PROTONIX) 40 MG tablet Take 40 mg by mouth 2 (two) times daily before a meal.    . polyethylene glycol (MIRALAX / GLYCOLAX) packet Take 17 g by mouth daily as needed.     . Prenatal Vit-Fe Fumarate-FA (PREPLUS) 27-1 MG TABS Take 1 tablet by mouth 2 (two) times daily.    Marland Kitchen saccharomyces boulardii (FLORASTOR) 250 MG capsule Take 250 mg by mouth daily.     . shark liver oil-cocoa butter (PREPARATION H) 0.25-3-85.5 % suppository Place 1 suppository rectally daily as needed for hemorrhoids.    . simvastatin (ZOCOR) 20 MG tablet Take 20 mg by mouth at bedtime.     No current facility-administered medications for this visit.      Physical Exam: BP (!) 120/56   Pulse 60   Resp 20   Ht 5\' 9"  (1.753 m)   Wt 165 lb (74.8 kg)   SpO2 95% Comment: RA  BMI 24.37 kg/m  He is in a wheel chair with spastic left hemiplegia Alert and conversant Cardiac exam shows a regular rate and rhythm with normal heart sounds Lungs are clear  Diagnostic Tests:  CLINICAL DATA:  Followup ascending thoracic aortic aneurysm.  EXAM: CT ANGIOGRAPHY CHEST WITH CONTRAST  TECHNIQUE: Multidetector CT  imaging of the chest was performed using the standard protocol during bolus administration of intravenous contrast. Multiplanar CT image reconstructions and MIPs were obtained to evaluate the vascular anatomy.  CONTRAST:  53mL ISOVUE-370 IOPAMIDOL (ISOVUE-370) INJECTION 76%  COMPARISON:  CT scan 03/14/2017  FINDINGS: Cardiovascular: The heart is normal in size and stable. No pericardial effusion. There is prominent pericardial and epicardial fat noted.  Fusiform aneurysmal dilatation of the ascending aorta is again demonstrated. This measures a maximum of 4.4 cm on image number 71 and is unchanged since the prior study. No dissection. The branch vessels are patent. Moderate atherosclerotic calcifications along the aortic arch and at the  branch vessel ostia. Stable scattered coronary artery calcifications.  The pulmonary arteries appear normal.  Mediastinum/Nodes: No mediastinal or hilar mass or adenopathy. Small scattered lymph nodes are stable. Stable large hiatal hernia. Most of the stomach is up in the chest.  Lungs/Pleura: No acute pulmonary findings or worrisome pulmonary lesions. There is vascular crowding and atelectasis around the large hiatal hernia in the left lower lobe. No pleural effusions.  Upper Abdomen: No significant upper abdominal findings. Stable moderate atherosclerotic calcifications involving the upper abdominal aorta but no aneurysm or dissection.  Musculoskeletal: No chest wall mass, supraclavicular or axillary adenopathy. The bony structures are unremarkable and stable.  Review of the MIP images confirms the above findings.  IMPRESSION: 1. 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 No dissection. 2. Stable scattered coronary artery calcifications. 3. Large hiatal hernia again demonstrated but no mediastinal or hilar mass or adenopathy. 4. No acute pulmonary findings.  Aortic aneurysm NOS (ICD10-I71.9).   Electronically Signed   By: Marijo Sanes M.D.   On: 04/25/2018 15:35   Impression:  This 77 year old gentleman has a stable 4.4 cm fusiform ascending aortic aneurysm.  This is still well below the surgical threshold of 5.5 cm.  I reviewed the CTA images with him and answered his questions.  I stressed the importance of good blood pressure control.  I have recommended a follow-up CTA of the chest in 1 year.  Plan:  He will return to see me in 1 year with a CTA of the chest.  I spent 10 minutes performing this established patient evaluation and > 50% of this time was spent face to face counseling and coordinating the care of this patient's aortic aneurysm.    Gaye Pollack, MD [facility] [phone]

## 2018-07-08 ENCOUNTER — Other Ambulatory Visit: Payer: Self-pay | Admitting: Adult Health Nurse Practitioner

## 2018-07-11 ENCOUNTER — Other Ambulatory Visit: Payer: Self-pay | Admitting: Internal Medicine

## 2018-07-11 DIAGNOSIS — R591 Generalized enlarged lymph nodes: Secondary | ICD-10-CM

## 2018-07-29 ENCOUNTER — Inpatient Hospital Stay: Admission: RE | Admit: 2018-07-29 | Payer: Medicare Other | Source: Ambulatory Visit

## 2018-08-16 ENCOUNTER — Other Ambulatory Visit: Payer: Medicare Other

## 2019-01-28 ENCOUNTER — Other Ambulatory Visit: Payer: Self-pay | Admitting: Ophthalmology

## 2019-01-29 ENCOUNTER — Encounter (HOSPITAL_COMMUNITY): Payer: Self-pay | Admitting: *Deleted

## 2019-01-29 NOTE — Pre-Procedure Instructions (Addendum)
   Gregory Kerr  01/29/2019      Your procedure is scheduled on Thursday, January 30, 2019  Report to [facility] Admitting at 11:30 A.M.  Call this number if you have problems the morning of surgery:  [phone]   Remember: Brush your teeth the morning of surgery with your regular toothpaste.  Do not eat or drink after midnight.  Take these medicines the morning of surgery with A SIP OF WATER : baclofen (LIORESAL),  gabapentin (NEURONTIN),  metoprolol tartrate (LOPRESSOR),   pantoprazole (PROTONIX), sertraline (ZOLOFT), Dextromethorphan-Quinidine (NUEDEXTA),  ipratropium (ATROVENT)  nasal spray If needed: acetaminophen (TYLENOL) for pain If needed: carboxymethylcellul-glycerin (REFRESH OPTIVE) eye drops for dry eyes If needed: nitroGLYCERIN (NITROSTAT) for chest pain  Stop taking  vitamins, fish oil and herbal medications. Do not take any NSAIDs ie: Ibuprofen, Advil, Naproxen (Aleve), Motrin, BC and Goody Powder; stop now.   Follow surgeon's instructions regarding dipyridamole-aspirin (AGGRENOX); if no instructions were provided, please call the surgeon's office for pre-op instructions.  Do not wear jewelry, make-up or nail polish.  Do not wear lotions, powders, or perfumes, or deodorant.  Do not shave 48 hours prior to surgery.  Men may shave face and neck.  Do not bring valuables to the hospital.  [facility] is not responsible for any belongings or valuables.  Contacts, dentures or bridgework may not be worn into surgery.  Leave your suitcase in the car.  After surgery it may be brought to your room. For patients admitted to the hospital, discharge time will be determined by your treatment team. Patients discharged the day of surgery will not be allowed to drive home.  Please read over the following fact sheets that you were given.

## 2019-01-29 NOTE — Progress Notes (Signed)
Pre-op instructions faxed to pt nurse Misty Stanley, LPN, at [facility]. Nurse verbalized understanding of all pre-op instructions. Nurse stated that she will contact surgeon for pre-op instructions regarding Aggrenox. Please complete pt assessment on DOS with pt.

## 2019-01-30 ENCOUNTER — Ambulatory Visit (HOSPITAL_COMMUNITY)
Admission: RE | Admit: 2019-01-30 | Discharge: 2019-01-30 | Disposition: A | Discharge status: Home / Self Care | Payer: Medicare Other | Attending: Ophthalmology | Admitting: Ophthalmology

## 2019-01-30 ENCOUNTER — Ambulatory Visit (HOSPITAL_COMMUNITY): Payer: Medicare Other | Admitting: Anesthesiology

## 2019-01-30 ENCOUNTER — Encounter (HOSPITAL_COMMUNITY): Payer: Self-pay | Admitting: Certified Registered"

## 2019-01-30 ENCOUNTER — Encounter (HOSPITAL_COMMUNITY)
Admission: RE | Disposition: A | Discharge status: Home / Self Care | Payer: Self-pay | Source: Home / Self Care | Attending: Ophthalmology

## 2019-01-30 DIAGNOSIS — H3322 Serous retinal detachment, left eye: Secondary | ICD-10-CM | POA: Diagnosis not present

## 2019-01-30 DIAGNOSIS — E785 Hyperlipidemia, unspecified: Secondary | ICD-10-CM | POA: Diagnosis not present

## 2019-01-30 DIAGNOSIS — Z79899 Other long term (current) drug therapy: Secondary | ICD-10-CM | POA: Insufficient documentation

## 2019-01-30 DIAGNOSIS — J449 Chronic obstructive pulmonary disease, unspecified: Secondary | ICD-10-CM | POA: Diagnosis not present

## 2019-01-30 DIAGNOSIS — F329 Major depressive disorder, single episode, unspecified: Secondary | ICD-10-CM | POA: Diagnosis not present

## 2019-01-30 DIAGNOSIS — H33002 Unspecified retinal detachment with retinal break, left eye: Secondary | ICD-10-CM

## 2019-01-30 DIAGNOSIS — Z8673 Personal history of transient ischemic attack (TIA), and cerebral infarction without residual deficits: Secondary | ICD-10-CM | POA: Insufficient documentation

## 2019-01-30 DIAGNOSIS — Z87891 Personal history of nicotine dependence: Secondary | ICD-10-CM | POA: Diagnosis not present

## 2019-01-30 DIAGNOSIS — I110 Hypertensive heart disease with heart failure: Secondary | ICD-10-CM | POA: Diagnosis not present

## 2019-01-30 DIAGNOSIS — M810 Age-related osteoporosis without current pathological fracture: Secondary | ICD-10-CM | POA: Insufficient documentation

## 2019-01-30 DIAGNOSIS — Z7902 Long term (current) use of antithrombotics/antiplatelets: Secondary | ICD-10-CM | POA: Diagnosis not present

## 2019-01-30 DIAGNOSIS — K219 Gastro-esophageal reflux disease without esophagitis: Secondary | ICD-10-CM | POA: Diagnosis not present

## 2019-01-30 DIAGNOSIS — I509 Heart failure, unspecified: Secondary | ICD-10-CM | POA: Insufficient documentation

## 2019-01-30 HISTORY — PX: PHOTOCOAGULATION WITH LASER: SHX6027

## 2019-01-30 HISTORY — DX: Serous retinal detachment, unspecified eye: H33.20

## 2019-01-30 HISTORY — PX: PARS PLANA VITRECTOMY: SHX2166

## 2019-01-30 LAB — BASIC METABOLIC PANEL
Anion gap: 7 (ref 5–15)
BUN: 13 mg/dL (ref 8–23)
CO2: 25 mmol/L (ref 22–32)
Calcium: 8.5 mg/dL — ABNORMAL LOW (ref 8.9–10.3)
Chloride: 105 mmol/L (ref 98–111)
Creatinine, Ser: 0.84 mg/dL (ref 0.61–1.24)
GFR calc Af Amer: >60 mL/min (ref >60)
GFR calc non Af Amer: >60 mL/min (ref >60)
Glucose, Bld: 87 mg/dL (ref 70–99)
Potassium: 3.9 mmol/L (ref 3.5–5.1)
Sodium: 137 mmol/L (ref 135–145)

## 2019-01-30 LAB — CBC
HCT: 42.3 % (ref 39.0–52.0)
Hemoglobin: 13.8 g/dL (ref 13.0–17.0)
MCH: 28.8 pg (ref 26.0–34.0)
MCHC: 32.6 g/dL (ref 30.0–36.0)
MCV: 88.3 fL (ref 80.0–100.0)
Platelets: 154 10*3/uL (ref 150–400)
RBC: 4.79 MIL/uL (ref 4.22–5.81)
RDW: 14 % (ref 11.5–15.5)
WBC: 4.4 10*3/uL (ref 4.0–10.5)
nRBC: 0 % (ref 0.0–0.2)

## 2019-01-30 LAB — SARS CORONAVIRUS 2 BY RT PCR (HOSPITAL ORDER, PERFORMED IN ~~LOC~~ HOSPITAL LAB): SARS Coronavirus 2: NEGATIVE

## 2019-01-30 SURGERY — PARS PLANA VITRECTOMY WITH 25 GAUGE
Anesthesia: Monitor Anesthesia Care | Site: Eye | Laterality: Left

## 2019-01-30 MED ORDER — BSS IO SOLN
INTRAOCULAR | Status: AC
  Filled 2019-01-30: qty 15

## 2019-01-30 MED ORDER — KETOROLAC TROMETHAMINE 30 MG/ML IJ SOLN
INTRAMUSCULAR | Status: DC | PRN
  Administered 2019-01-30: 30 mg via INTRAVENOUS

## 2019-01-30 MED ORDER — VANCOMYCIN SUBCONJUNCTIVAL INJECTION 25 MG/0.5 ML
25.0000 mg | INTRAOCULAR | Status: DC
  Filled 2019-01-30: qty 0.5

## 2019-01-30 MED ORDER — ATROPINE SULFATE 1 % OP SOLN
1.0000 [drp] | OPHTHALMIC | Status: DC | PRN
  Administered 2019-01-30 (×3): 1 [drp] via OPHTHALMIC
  Filled 2019-01-30: qty 2

## 2019-01-30 MED ORDER — TOBRAMYCIN-DEXAMETHASONE 0.3-0.1 % OP OINT
TOPICAL_OINTMENT | OPHTHALMIC | Status: DC | PRN
  Administered 2019-01-30: 1 via OPHTHALMIC

## 2019-01-30 MED ORDER — ONDANSETRON HCL 4 MG/2ML IJ SOLN
INTRAMUSCULAR | Status: AC
  Filled 2019-01-30: qty 2

## 2019-01-30 MED ORDER — OXYCODONE HCL 5 MG PO TABS: 5.0000 mg | ORAL_TABLET | Freq: Once | ORAL | Status: DC | PRN

## 2019-01-30 MED ORDER — TOBRAMYCIN-DEXAMETHASONE 0.3-0.1 % OP OINT
TOPICAL_OINTMENT | OPHTHALMIC | Status: AC
  Filled 2019-01-30: qty 3.5

## 2019-01-30 MED ORDER — OXYCODONE HCL 5 MG/5ML PO SOLN: 5.0000 mg | Freq: Once | ORAL | Status: DC | PRN

## 2019-01-30 MED ORDER — KETOROLAC TROMETHAMINE 30 MG/ML IJ SOLN
INTRAMUSCULAR | Status: AC
  Filled 2019-01-30: qty 1

## 2019-01-30 MED ORDER — DEXAMETHASONE SODIUM PHOSPHATE 10 MG/ML IJ SOLN
INTRAMUSCULAR | Status: DC | PRN
  Administered 2019-01-30: 10 mg

## 2019-01-30 MED ORDER — HYPROMELLOSE (GONIOSCOPIC) 2.5 % OP SOLN
OPHTHALMIC | Status: DC | PRN
  Administered 2019-01-30: 2 [drp] via OPHTHALMIC

## 2019-01-30 MED ORDER — HYALURONIDASE HUMAN 150 UNIT/ML IJ SOLN
INTRAMUSCULAR | Status: AC
  Filled 2019-01-30: qty 1

## 2019-01-30 MED ORDER — EPINEPHRINE PF 1 MG/ML IJ SOLN
INTRAOCULAR | Status: DC | PRN
  Administered 2019-01-30: 500.3 mL

## 2019-01-30 MED ORDER — TETRACAINE HCL 0.5 % OP SOLN
OPHTHALMIC | Status: DC | PRN
  Administered 2019-01-30: 1 [drp] via OPHTHALMIC

## 2019-01-30 MED ORDER — BUPIVACAINE HCL (PF) 0.75 % IJ SOLN
INTRAMUSCULAR | Status: AC
  Filled 2019-01-30: qty 10

## 2019-01-30 MED ORDER — PROPOFOL 10 MG/ML IV BOLUS
INTRAVENOUS | Status: DC | PRN
  Administered 2019-01-30: 30 mg via INTRAVENOUS
  Administered 2019-01-30: 20 mg via INTRAVENOUS

## 2019-01-30 MED ORDER — BSS PLUS IO SOLN
INTRAOCULAR | Status: AC
  Filled 2019-01-30: qty 500

## 2019-01-30 MED ORDER — LIDOCAINE HCL 2 % IJ SOLN
INTRAMUSCULAR | Status: DC | PRN
  Administered 2019-01-30: 6 mL via RETROBULBAR

## 2019-01-30 MED ORDER — PROPOFOL 10 MG/ML IV BOLUS
INTRAVENOUS | Status: AC
  Filled 2019-01-30: qty 20

## 2019-01-30 MED ORDER — SODIUM CHLORIDE 0.9 % IV SOLN
INTRAVENOUS | Status: DC
  Administered 2019-01-30 (×2): via INTRAVENOUS

## 2019-01-30 MED ORDER — ONDANSETRON HCL 4 MG/2ML IJ SOLN
INTRAMUSCULAR | Status: DC | PRN
  Administered 2019-01-30: 4 mg via INTRAVENOUS

## 2019-01-30 MED ORDER — EPINEPHRINE PF 1 MG/ML IJ SOLN
INTRAMUSCULAR | Status: AC
  Filled 2019-01-30: qty 1

## 2019-01-30 MED ORDER — INDOCYANINE GREEN 25 MG IV SOLR
INTRAVENOUS | Status: AC
  Filled 2019-01-30: qty 25

## 2019-01-30 MED ORDER — CEFAZOLIN SUBCONJUNCTIVAL INJECTION 100 MG/0.5 ML
100.0000 mg | INJECTION | SUBCONJUNCTIVAL | Status: DC
  Filled 2019-01-30: qty 5

## 2019-01-30 MED ORDER — ATROPINE SULFATE 1 % OP SOLN
OPHTHALMIC | Status: AC
  Filled 2019-01-30: qty 5

## 2019-01-30 MED ORDER — VANCOMYCIN INTRAVITREAL INJECTION 1 MG/0.1 ML: 1.0000 mg | INTRAOCULAR | Status: DC

## 2019-01-30 MED ORDER — LIDOCAINE HCL 2 % IJ SOLN
INTRAMUSCULAR | Status: AC
  Filled 2019-01-30: qty 20

## 2019-01-30 MED ORDER — DEXAMETHASONE SODIUM PHOSPHATE 10 MG/ML IJ SOLN
INTRAMUSCULAR | Status: AC
  Filled 2019-01-30: qty 1

## 2019-01-30 MED ORDER — HYPROMELLOSE (GONIOSCOPIC) 2.5 % OP SOLN
OPHTHALMIC | Status: AC
  Filled 2019-01-30: qty 15

## 2019-01-30 MED ORDER — PHENYLEPHRINE HCL 2.5 % OP SOLN
1.0000 [drp] | OPHTHALMIC | Status: DC | PRN
  Administered 2019-01-30 (×3): 1 [drp] via OPHTHALMIC
  Filled 2019-01-30: qty 2

## 2019-01-30 MED ORDER — BSS IO SOLN
INTRAOCULAR | Status: DC | PRN
  Administered 2019-01-30: 15 mL via INTRAOCULAR

## 2019-01-30 MED ORDER — DEXAMETHASONE SODIUM PHOSPHATE 10 MG/ML IJ SOLN
INTRAMUSCULAR | Status: DC | PRN
  Administered 2019-01-30: 5 mg via INTRAVENOUS

## 2019-01-30 MED ORDER — TETRACAINE HCL 0.5 % OP SOLN
OPHTHALMIC | Status: AC
  Filled 2019-01-30: qty 4

## 2019-01-30 MED ORDER — VANCOMYCIN SUBCONJUNCTIVAL INJECTION 25 MG/0.5 ML
INTRAOCULAR | Status: DC | PRN
  Administered 2019-01-30: 25 mg via SUBCONJUNCTIVAL

## 2019-01-30 MED ORDER — HYDROMORPHONE HCL 1 MG/ML IJ SOLN: 0.2500 mg | INTRAMUSCULAR | Status: DC | PRN

## 2019-01-30 MED ORDER — PROMETHAZINE HCL 25 MG/ML IJ SOLN: 6.2500 mg | INTRAMUSCULAR | Status: DC | PRN

## 2019-01-30 SURGICAL SUPPLY — 65 items
ACCESSORY FRAGMATOME (MISCELLANEOUS) IMPLANT
APPLICATOR DR MATTHEWS STRL (MISCELLANEOUS) IMPLANT
BLADE MVR KNIFE 20G (BLADE) IMPLANT
BNDG EYE OVAL (GAUZE/BANDAGES/DRESSINGS) IMPLANT
CABLE BIPOLOR RESECTION CORD (MISCELLANEOUS) IMPLANT
CANNULA ANT CHAM MAIN (OPHTHALMIC RELATED) IMPLANT
CANNULA ANTERIOR CHAMBER 27GA (MISCELLANEOUS) IMPLANT
CANNULA DUAL BORE 23G (CANNULA) IMPLANT
CANNULA DUALBORE 25G (CANNULA) IMPLANT
CANNULA VLV SOFT TIP 25GA (OPHTHALMIC) ×2 IMPLANT
CAUTERY EYE LOW TEMP 1300F FIN (OPHTHALMIC RELATED) IMPLANT
CLSR STERI-STRIP ANTIMIC 1/2X4 (GAUZE/BANDAGES/DRESSINGS) ×2 IMPLANT
CORD BIPOLAR FORCEPS 12FT (ELECTRODE) ×2 IMPLANT
COVER MAYO STAND STRL (DRAPES) IMPLANT
COVER WAND RF STERILE (DRAPES) ×2 IMPLANT
DRAPE HALF SHEET 40X57 (DRAPES) ×2 IMPLANT
DRAPE INCISE 51X51 W/FILM STRL (DRAPES) IMPLANT
DRAPE POUCH INSTRU U-SHP 10X18 (DRAPES) ×2 IMPLANT
DRAPE RETRACTOR (MISCELLANEOUS) ×2 IMPLANT
ERASER HMR WETFIELD 23G BP (MISCELLANEOUS) IMPLANT
FILTER BLUE MILLIPORE (MISCELLANEOUS) IMPLANT
FORCEPS ECKARDT ILM 25G SERR (OPHTHALMIC RELATED) IMPLANT
FORCEPS GRIESHABER ILM 25G A (INSTRUMENTS) IMPLANT
GAS AUTO FILL CONSTEL (OPHTHALMIC) ×2
GAS AUTO FILL CONSTELLATION (OPHTHALMIC) ×1 IMPLANT
GLOVE BIO SURGEON STRL SZ7.5 (GLOVE) ×4 IMPLANT
GOWN STRL REUS W/ TWL LRG LVL3 (GOWN DISPOSABLE) ×2 IMPLANT
GOWN STRL REUS W/TWL LRG LVL3 (GOWN DISPOSABLE) ×2
HANDLE PNEUMATIC FOR CONSTEL (OPHTHALMIC) IMPLANT
KIT BASIN OR (CUSTOM PROCEDURE TRAY) ×2 IMPLANT
KIT TURNOVER KIT B (KITS) ×2 IMPLANT
LENS BIOM SUPER VIEW SET DISP (MISCELLANEOUS) ×2 IMPLANT
MICROPICK 25G (MISCELLANEOUS)
NEEDLE 18GX1X1/2 (RX/OR ONLY) (NEEDLE) ×2 IMPLANT
NEEDLE 25GX 5/8IN NON SAFETY (NEEDLE) ×2 IMPLANT
NEEDLE FILTER BLUNT 18X 1/2SAF (NEEDLE)
NEEDLE FILTER BLUNT 18X1 1/2 (NEEDLE) IMPLANT
NEEDLE HYPO 25GX1X1/2 BEV (NEEDLE) IMPLANT
NEEDLE HYPO 30X.5 LL (NEEDLE) ×4 IMPLANT
NEEDLE RETROBULBAR 25GX1.5 (NEEDLE) ×2 IMPLANT
NS IRRIG 1000ML POUR BTL (IV SOLUTION) ×2 IMPLANT
PACK FRAGMATOME (OPHTHALMIC) IMPLANT
PACK VITRECTOMY CUSTOM (CUSTOM PROCEDURE TRAY) ×2 IMPLANT
PAD ARMBOARD 7.5X6 YLW CONV (MISCELLANEOUS) ×4 IMPLANT
PAK PIK VITRECTOMY CVS 25GA (OPHTHALMIC) ×4 IMPLANT
PENCIL BIPOLAR 25GA STR DISP (OPHTHALMIC RELATED) ×2 IMPLANT
PICK MICROPICK 25G (MISCELLANEOUS) IMPLANT
PROBE LASER ILLUM FLEX CVD 25G (OPHTHALMIC) IMPLANT
ROLLS DENTAL (MISCELLANEOUS) IMPLANT
SCRAPER DIAMOND 25GA (OPHTHALMIC RELATED) IMPLANT
SHIELD EYE LENSE ONLY DISP (GAUZE/BANDAGES/DRESSINGS) ×2 IMPLANT
STOCKINETTE IMPERVIOUS 9X36 MD (GAUZE/BANDAGES/DRESSINGS) ×4 IMPLANT
STOCKINETTE IMPERVIOUS LG (DRAPES) ×4 IMPLANT
STOPCOCK 4 WAY LG BORE MALE ST (IV SETS) IMPLANT
SUT ETHILON 8 0 TG100 8 (SUTURE) IMPLANT
SUT VICRYL 7 0 TG140 8 (SUTURE) IMPLANT
SUT VICRYL 8 0 TG140 8 (SUTURE) IMPLANT
SUT VICRYL ABS 6-0 S29 18IN (SUTURE) IMPLANT
SYR 10ML LL (SYRINGE) ×2 IMPLANT
SYR 20ML LL LF (SYRINGE) ×2 IMPLANT
SYR 5ML LL (SYRINGE) ×2 IMPLANT
SYR TB 1ML LUER SLIP (SYRINGE) IMPLANT
TOWEL GREEN STERILE FF (TOWEL DISPOSABLE) ×2 IMPLANT
TUBE CONNECTING 12X1/4 (SUCTIONS) IMPLANT
WATER STERILE IRR 1000ML POUR (IV SOLUTION) ×2 IMPLANT

## 2019-01-30 NOTE — Brief Op Note (Signed)
01/30/2019  3:55 PM  PATIENT:  Gregory Kerr  78 y.o. male  PRE-OPERATIVE DIAGNOSIS:  RETINAL DETACHMENT, LEFT EYE  POST-OPERATIVE DIAGNOSIS:  RETINAL DETACHMENT, LEFT EYE  PROCEDURE:  Procedure(s): PARS PLANA VITRECTOMY WITH 25 GAUGE (Left) PHOTOCOAGULATION WITH LASER (Left) GAS/FLUID EXCHANGE VS SILICONE OIL (Left) INJECTION OF SILICONE OIL VS GAS FLUID EXCHANGE (Left)  SURGEON:  Surgeon(s) and Role:    Sherlynn Stalls, MD - Primary  PHYSICIAN ASSISTANT:   ASSISTANTS: none   ANESTHESIA:   MAC  EBL:  1 mL   BLOOD ADMINISTERED:none  DRAINS: none   LOCAL MEDICATIONS USED:  BUPIVICAINE   SPECIMEN:  No Specimen  DISPOSITION OF SPECIMEN:  N/A  COUNTS:  YES  TOURNIQUET:  * No tourniquets in log *  DICTATION: .Other Dictation: Dictation Number [id number]  PLAN OF CARE: Discharge to home after PACU  PATIENT DISPOSITION:  PACU - hemodynamically stable.   Delay start of Pharmacological VTE agent (>24hrs) due to surgical blood loss or risk of bleeding: not applicable

## 2019-01-30 NOTE — Op Note (Signed)
NAME: Gregory Kerr, Gregory Kerr MEDICAL RECORD [id number] ACCOUNT [id number] DATE OF BIRTH:03-07-1941 FACILITY: [facility] LOCATION: [facility] PHYSICIAN:Almarosa Bohac Greg Cutter, MD  OPERATIVE REPORT  DATE OF PROCEDURE:  01/30/2019  SURGEON:  Sherlynn Stalls, MD  PREOPERATIVE DIAGNOSIS:  Retinal detachment, left eye.  POSTOPERATIVE DIAGNOSIS:  Retinal detachment, left eye, with a complex retinal detachment.  FINDINGS:  There was a complex retinal detachment with greater than 270 degrees of involvement of the retina.  COMPLICATIONS:  None.  FINDINGS:  As stated before, greater than 270-degree retinal detachment in the left eye.    ANESTHESIA:  Local MAC with retrobulbar injection of the left eye.  DESCRIPTION OF PROCEDURE:  The patient was identified in the preoperative holding area.  He was then taken to the operating room where he was sedated by the anesthesia team.  At that point in time, the left eye was anesthetized using a retrobulbar block  consisting of a 1:1 mixture of 0.75% bupivacaine and 1% lidocaine.  The needle was withdrawn from the orbital cone without difficulty.  After excellent akinesia and anesthesia was obtained in the left eye, the left eye was prepped and draped in the usual  sterile fashion for ocular surgery.  A Lieberman speculum was placed between the left upper and lower eyelids for exposure.  25-gauge trocars were used to introduce transconjunctival cannulas in the inferotemporal, superotemporal, and superonasal  quadrants.  The trocars were removed, leaving the cannulas in place.  An infusion cannula was placed in the inferotemporal quadrant and confirmed to be in the vitreous cavity prior to its use.  The eye then underwent a core vitrectomy with a vitreous  cutter and light pipe.  The large retinal break was identified at 12 o'clock.  This retinal break was marked using endocautery.  The eye was inspected with scleral depression.  No other retinal breaks were identified.  A  soft-tipped extrusion cannula was  then used to extrude fluid from underneath the retina through the break as an air fluid exchange was performed.  After this was completed, there was residual posterior subretinal fluid that was identified.  This fluid was accessed by creating a  retinotomy in the superior nasal posterior pole using an endocautery probe.  After the retinotomy was created, a soft-tip extrusion cannula was used to extrude the remaining subretinal fluid from the subretinal space without difficulty.  This allowed the  retina to flatten completely.  Once this was completed, endolaser photocoagulation was placed around the retinal break and the posterior retinotomy.  The retina looked excellent at this point in time.  The eye was then flushed with a 14% concentration  of C3F8 gas.  Once this was completed, the cannulas were removed from the sclerotomies.  Sclerotomies were inspected and found to be watertight.  The eye was then treated with subconjunctival injections of 15 mg of vancomycin and 1 mg dexamethasone.  The  eye was treated topically with 1 drop of 1% atropine and TobraDex ointment.  The speculum was removed.  The eyelids were cleaned and closed and then patched and shielded.  The patient was then taken to recovery in excellent condition, having tolerated  the procedure very well.  LN/NUANCE  D:01/30/2019 T:01/30/2019 JOB:[id number]

## 2019-01-30 NOTE — Anesthesia Postprocedure Evaluation (Signed)
Anesthesia Post Note  Patient: Gregory Kerr  Procedure(s) Performed: PARS PLANA VITRECTOMY WITH 25 GAUGE (Left Eye) PHOTOCOAGULATION WITH LASER (Left Eye) GAS/FLUID EXCHANGE VS SILICONE OIL (Left Eye) INJECTION OF SILICONE OIL VS GAS FLUID EXCHANGE (Left Eye)     Anesthesia Post Evaluation  Last Vitals:  Vitals:   01/30/19 1545 01/30/19 1558  BP: 136/62 134/70  Pulse: 63   Resp: 18   Temp: 36.7 C   SpO2: 97%     Last Pain:  Vitals:   01/30/19 1558  TempSrc:   PainSc: 0-No pain                 Pervis Hocking

## 2019-01-30 NOTE — Anesthesia Preprocedure Evaluation (Addendum)
Anesthesia Evaluation  Patient identified by MRN, date of birth, ID band Patient awake    Reviewed: Allergy & Precautions, NPO status , Patient's Chart, lab work & pertinent test results, reviewed documented beta blocker date and time   Airway Mallampati: II  TM Distance: >3 FB Neck ROM: Full    Dental no notable dental hx. (+) Dental Advisory Given, Chipped,    Pulmonary COPD, former smoker,  No inhalers   Pulmonary exam normal breath sounds clear to auscultation       Cardiovascular hypertension, Pt. on medications +CHF  Normal cardiovascular exam Rhythm:Regular Rate:Normal  Last echo 2015:  - Left ventricle: The cavity size was normal. Wall thickness was  normal. Systolic function was normal. The estimated ejection  fraction was in the range of 60% to 65%. Doppler parameters are  consistent with abnormal left ventricular relaxation (grade 1  diastolic dysfunction).  - Aortic valve: There was mild regurgitation.  - Right ventricle: RV appears mildly dilated. and function appears  mildly depressed. The cavity size was mildly dilated.    CT A/P 04/2018:  1. 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 No dissection. 2. Stable scattered coronary artery calcifications. 3. Large hiatal hernia again demonstrated but no mediastinal or hilar mass or adenopathy. 4. No acute pulmonary findings.   Neuro/Psych PSYCHIATRIC DISORDERS Depression Cerebral infarct CVA    GI/Hepatic Neg liver ROS, GERD  Medicated,IBS    Endo/Other  negative endocrine ROS  Renal/GU negative Renal ROS  negative genitourinary   Musculoskeletal  (+) Arthritis , Osteoarthritis,    Abdominal   Peds negative pediatric ROS (+)  Hematology negative hematology ROS (+)   Anesthesia Other Findings Does not walk  Reproductive/Obstetrics negative OB ROS                             Anesthesia Physical  Anesthesia Plan  ASA: III  Anesthesia Plan: MAC   Post-op Pain Management:    Induction:   PONV Risk Score and Plan: 2 and Ondansetron and Dexamethasone  Airway Management Planned: Natural Airway  Additional Equipment: None  Intra-op Plan:   Post-operative Plan:   Informed Consent: I have reviewed the patients History and Physical, chart, labs and discussed the procedure including the risks, benefits and alternatives for the proposed anesthesia with the patient or authorized representative who has indicated his/her understanding and acceptance.     Dental advisory given  Plan Discussed with: CRNA  Anesthesia Plan Comments:       Anesthesia Quick Evaluation

## 2019-01-30 NOTE — H&P (Signed)
Gregory Kerr is an 78 y.o. male.   Chief Complaint: loss of vision OS  HPI: dx with RD OS  Past Medical History:  Diagnosis Date  . GERD (gastroesophageal reflux disease)   . Hernia    "large one; on my left stomach" (09/22/2014)  . Hiatal hernia 10/24/2012  . HTN (hypertension) 10/24/2012  . Hyperlipidemia   . Hypertension   . 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 Osteoporosis, unspecified 10/24/2012  . 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 Pneumonia    "once or twice" (09/22/2014)  . Retinal detachment    left eye  . Stroke [facility]) 2007   "left side doesn't work now" (09/22/2014)    Past Surgical History:  Procedure Laterality Date  . APPENDECTOMY  05/27/2009   Ronnald Collum, MD  . CATARACT EXTRACTION W/ INTRAOCULAR LENS  IMPLANT, BILATERAL Bilateral ?2014  . CHOLECYSTECTOMY N/A 09/26/2014   Procedure: LAPAROSCOPIC CHOLECYSTECTOMY WITH INTRAOPERATIVE CHOLANGIOGRAM;  Surgeon: Donnie Mesa, MD;  Location: [place];  Service: General;  Laterality: N/A;  . COLONOSCOPY WITH PROPOFOL N/A 12/29/2015   Procedure: COLONOSCOPY WITH PROPOFOL;  Surgeon: Wilford Corner, MD;  Location[facility] ENDOSCOPY;  Service: Endoscopy;  Laterality: N/A;  . ERCP N/A 09/25/2014   Procedure: ENDOSCOPIC RETROGRADE CHOLANGIOPANCREATOGRAPHY (ERCP);  Surgeon: Clarene Essex, MD;  Location[facility] ENDOSCOPY;  Service: Endoscopy;  Laterality: N/A;    Family History  Problem Relation Age of Onset  . Heart attack Mother   . Heart attack Father   . Cancer Maternal Aunt        Stomach  . Cancer Paternal Aunt        Uterine,Colon   Social History:  reports that he quit smoking about 13 years ago. His smoking use included cigarettes. He smoked 1.00 pack per day. He has never used smokeless tobacco. He reports current alcohol use. He reports that he does not use drugs.  Allergies:  Allergies  Allergen Reactions  . Penicillins Rash    ++++Patient in nursing home. Unable to clarify details/severity of the reaction.++++    Medications Prior to  Admission  Medication Sig Dispense Refill  . acetaminophen (TYLENOL) 325 MG tablet Take 650 mg by mouth every 4 (four) hours as needed for mild pain or headache.    . baclofen (LIORESAL) 10 MG tablet Take 10 mg by mouth 4 (four) times daily.     . calcium-vitamin D (OSCAL WITH D) 500-200 MG-UNIT tablet Take 1 tablet by mouth 2 (two) times daily.    . carboxymethylcellul-glycerin (REFRESH OPTIVE) 0.5-0.9 % ophthalmic solution Place 1 drop into both eyes 4 (four) times daily as needed for dry eyes.    . Cholecalciferol 2000 units TABS Take 2,000 Units by mouth daily.    Marland Kitchen denosumab (PROLIA) 60 MG/ML SOLN injection Inject 60 mg into the skin every 6 (six) months. Administer in upper arm, thigh, or abdomen    . Dextromethorphan-Quinidine (NUEDEXTA) 20-10 MG CAPS Take 1 capsule by mouth daily. For Pseudobulbar Affect (PBA)    . dipyridamole-aspirin (AGGRENOX) 200-25 MG per 12 hr capsule Take one capsule by mouth twice daily for CVA    . furosemide (LASIX) 20 MG tablet Take 20 mg by mouth daily.     Marland Kitchen gabapentin (NEURONTIN) 100 MG capsule Take 100 mg by mouth 3 (three) times daily.    Marland Kitchen ipratropium (ATROVENT) 0.06 % nasal spray Place 2 sprays into both nostrils 3 (three) times daily.    Marland Kitchen loperamide (IMODIUM) 2 MG capsule Take 2-4 mg by mouth See  admin instructions. Take 4mg  after the initial loose stool then take 2mg  after each loose stool. Do not exceed 16mg  in a 24 hour period.    . metoprolol tartrate (LOPRESSOR) 25 MG tablet Take 0.5 tablets (12.5 mg total) by mouth 2 (two) times daily.    . nitroGLYCERIN (NITROSTAT) 0.4 MG SL tablet Place 0.4 mg under the tongue every 5 (five) minutes as needed for chest pain.    . pantoprazole (PROTONIX) 40 MG tablet Take 40 mg by mouth daily.     . polyethylene glycol (MIRALAX / GLYCOLAX) packet Take 17 g by mouth daily as needed (constipation).     . Prenatal Vit-Fe Fumarate-FA (PREPLUS) 27-1 MG TABS Take 1 tablet by mouth daily.     . sertraline (ZOLOFT) 50 MG  tablet Take 50 mg by mouth daily.    . simvastatin (ZOCOR) 20 MG tablet Take 20 mg by mouth at bedtime.    . solifenacin (VESICARE) 5 MG tablet Take 5 mg by mouth daily.    . tamsulosin (FLOMAX) 0.4 MG CAPS capsule Take 0.4 mg by mouth at bedtime.      Results for orders placed or performed during the hospital encounter of 01/30/19 (from the past 48 hour(s))  Basic metabolic panel     Status: Abnormal   Collection Time: 01/30/19 11:08 AM  Result Value Ref Range   Sodium 137 135 - 145 mmol/L   Potassium 3.9 3.5 - 5.1 mmol/L   Chloride 105 98 - 111 mmol/L   CO2 25 22 - 32 mmol/L   Glucose, Bld 87 70 - 99 mg/dL   BUN 13 8 - 23 mg/dL   Creatinine, Ser 0.84 0.61 - 1.24 mg/dL   Calcium 8.5 (L) 8.9 - 10.3 mg/dL   GFR calc non Af Amer >60 >60 mL/min   GFR calc Af Amer >60 >60 mL/min   Anion gap 7 5 - 15    Comment: Performed at [facility], [place] [address] [place], [place] [address]  CBC     Status: None   Collection Time: 01/30/19 11:08 AM  Result Value Ref Range   WBC 4.4 4.0 - 10.5 K/uL   RBC 4.79 4.22 - 5.81 MIL/uL   Hemoglobin 13.8 13.0 - 17.0 g/dL   HCT 42.3 39.0 - 52.0 %   MCV 88.3 80.0 - 100.0 fL   MCH 28.8 26.0 - 34.0 pg   MCHC 32.6 30.0 - 36.0 g/dL   RDW 14.0 11.5 - 15.5 %   Platelets 154 150 - 400 K/uL   nRBC 0.0 0.0 - 0.2 %    Comment: Performed at [facility], [place] [address], [place], [place] [address]  SARS Coronavirus 2 [facility] order, Performed in [facility] hospital lab) Nasopharyngeal Nasopharyngeal Swab     Status: None   Collection Time: 01/30/19 11:10 AM   Specimen: Nasopharyngeal Swab  Result Value Ref Range   SARS Coronavirus 2 NEGATIVE NEGATIVE    Comment: (NOTE) If result is NEGATIVE SARS-CoV-2 target nucleic acids are NOT DETECTED. The SARS-CoV-2 RNA is generally detectable in upper and lower  respiratory specimens during the acute phase of infection. The lowest  concentration of SARS-CoV-2 viral copies this assay can detect is 250   copies / mL. A negative result does not preclude SARS-CoV-2 infection  and should not be used as the sole basis for treatment or other  patient management decisions.  A negative result may occur with  improper specimen collection / handling,  submission of specimen other  than nasopharyngeal swab, presence of viral mutation(s) within the  areas targeted by this assay, and inadequate number of viral copies  (<250 copies / mL). A negative result must be combined with clinical  observations, patient history, and epidemiological information. If result is POSITIVE SARS-CoV-2 target nucleic acids are DETECTED. The SARS-CoV-2 RNA is generally detectable in upper and lower  respiratory specimens dur ing the acute phase of infection.  Positive  results are indicative of active infection with SARS-CoV-2.  Clinical  correlation with patient history and other diagnostic information is  necessary to determine patient infection status.  Positive results do  not rule out bacterial infection or co-infection with other viruses. If result is PRESUMPTIVE POSTIVE SARS-CoV-2 nucleic acids MAY BE PRESENT.   A presumptive positive result was obtained on the submitted specimen  and confirmed on repeat testing.  While 2019 novel coronavirus  (SARS-CoV-2) nucleic acids may be present in the submitted sample  additional confirmatory testing may be necessary for epidemiological  and / or clinical management purposes  to differentiate between  SARS-CoV-2 and other Sarbecovirus currently known to infect humans.  If clinically indicated additional testing with an alternate test  methodology [phone]) is advised. The SARS-CoV-2 RNA is generally  detectable in upper and lower respiratory sp ecimens during the acute  phase of infection. The expected result is Negative. Fact Sheet for Patients:  StrictlyIdeas.no Fact Sheet for Healthcare Providers: BankingDealers.co.za  This test is not yet approved or cleared by the [place] FDA and has been authorized for detection and/or diagnosis of SARS-CoV-2 by FDA under an Emergency Use Authorization (EUA).  This EUA will remain in effect (meaning this test can be used) for the duration of the COVID-19 declaration under Section 564(b)(1) of the Act, 21 U.S.C. section 360bbb-3(b)(1), unless the authorization is terminated or revoked sooner. Performed at [facility], [place] [address], [place], [place] [address]    No results found.  Review of Systems  Neurological: Positive for focal weakness.    Blood pressure (!) 148/69, pulse (!) 59, temperature 98.3 F (36.8 C), temperature source Oral, resp. rate 16, height 5\' 9"  (1.753 m), weight 73 kg, SpO2 94 %. Physical Exam  Eyes:       Assessment/Plan 1. RD OS: PPV/EC/EL/GFX OS  Nadezhda Pollitt B, MD 01/30/2019, 2:09 PM

## 2019-01-30 NOTE — Transfer of Care (Signed)
Immediate Anesthesia Transfer of Care Note  Patient: Gregory Kerr  Procedure(s) Performed: PARS PLANA VITRECTOMY WITH 25 GAUGE (Left Eye) PHOTOCOAGULATION WITH LASER (Left Eye) GAS/FLUID EXCHANGE VS SILICONE OIL (Left Eye) INJECTION OF SILICONE OIL VS GAS FLUID EXCHANGE (Left Eye)  Patient Location: PACU  Anesthesia Type:MAC  Level of Consciousness: awake, alert  and oriented  Airway & Oxygen Therapy: Patient Spontanous Breathing  Post-op Assessment: Report given to RN  Post vital signs: Reviewed and stable  Last Vitals:  Vitals Value Taken Time  BP    Temp    Pulse 63 01/30/19 1545  Resp 18 01/30/19 1545  SpO2 97 % 01/30/19 1545  Vitals shown include unvalidated device data.  Last Pain:  Vitals:   01/30/19 1102  TempSrc: Oral         Complications: No apparent anesthesia complications

## 2019-01-31 ENCOUNTER — Encounter (HOSPITAL_COMMUNITY): Payer: Self-pay | Admitting: Ophthalmology

## 2019-04-08 ENCOUNTER — Other Ambulatory Visit: Payer: Self-pay | Admitting: *Deleted

## 2019-04-08 DIAGNOSIS — I712 Thoracic aortic aneurysm, without rupture, unspecified: Secondary | ICD-10-CM

## 2019-05-07 ENCOUNTER — Ambulatory Visit
Admission: RE | Admit: 2019-05-07 | Discharge: 2019-05-07 | Disposition: A | Discharge status: Home / Self Care | Payer: Medicare Other | Source: Ambulatory Visit | Attending: Surgery | Admitting: Surgery

## 2019-05-07 ENCOUNTER — Ambulatory Visit: Payer: Medicare Other | Admitting: Surgery

## 2019-05-07 ENCOUNTER — Encounter: Payer: Self-pay | Admitting: Surgery

## 2019-05-07 ENCOUNTER — Other Ambulatory Visit: Payer: Self-pay

## 2019-05-07 ENCOUNTER — Ambulatory Visit (INDEPENDENT_AMBULATORY_CARE_PROVIDER_SITE_OTHER): Payer: Medicare Other | Admitting: Surgery

## 2019-05-07 VITALS — BP 160/78 | HR 61 | Temp 97.9°F | Resp 20 | Ht 69.0 in | Wt 165.0 lb

## 2019-05-07 DIAGNOSIS — I712 Thoracic aortic aneurysm, without rupture, unspecified: Secondary | ICD-10-CM

## 2019-05-07 MED ORDER — IOPAMIDOL (ISOVUE-370) INJECTION 76%
75.0000 mL | Freq: Once | INTRAVENOUS | Status: AC | PRN
  Administered 2019-05-07: 75 mL via INTRAVENOUS

## 2019-05-07 NOTE — Progress Notes (Signed)
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 He lives at [facility] in [place].  Since I last saw him he has been feeling well with no change in his medical condition.  Current Outpatient Medications  Medication Sig Dispense Refill  . acetaminophen (TYLENOL) 325 MG tablet Take 650 mg by mouth every 4 (four) hours as needed for mild pain or headache.    . baclofen (LIORESAL) 10 MG tablet Take 10 mg by mouth 4 (four) times daily.     . calcium-vitamin D (OSCAL WITH D) 500-200 MG-UNIT tablet Take 1 tablet by mouth 2 (two) times daily.    . carboxymethylcellul-glycerin (REFRESH OPTIVE) 0.5-0.9 % ophthalmic solution Place 1 drop into both eyes 4 (four) times daily as needed for dry eyes.    . Cholecalciferol 2000 units TABS Take 2,000 Units by mouth daily.    Marland Kitchen denosumab (PROLIA) 60 MG/ML SOLN injection Inject 60 mg into the skin every 6 (six) months. Administer in upper arm, thigh, or abdomen    . Dextromethorphan-Quinidine (NUEDEXTA) 20-10 MG CAPS Take 1 capsule by mouth daily. For Pseudobulbar Affect (PBA)    . dipyridamole-aspirin (AGGRENOX) 200-25 MG per 12 hr capsule Take one capsule by mouth twice daily for CVA    . furosemide (LASIX) 20 MG tablet Take 20 mg by mouth daily.     Marland Kitchen gabapentin (NEURONTIN) 100 MG capsule Take 100 mg by mouth 3 (three) times daily.    Marland Kitchen ipratropium (ATROVENT) 0.06 % nasal spray Place 2 sprays into both nostrils 3 (three) times daily.    Marland Kitchen loperamide (IMODIUM) 2 MG capsule Take 2-4 mg by mouth See admin instructions. Take 4mg  after the initial loose stool then take 2mg  after each loose stool. Do not exceed 16mg  in a 24 hour period.    . metoprolol tartrate (LOPRESSOR) 25 MG tablet Take 0.5 tablets (12.5 mg total) by  mouth 2 (two) times daily.    . nitroGLYCERIN (NITROSTAT) 0.4 MG SL tablet Place 0.4 mg under the tongue every 5 (five) minutes as needed for chest pain.    . pantoprazole (PROTONIX) 40 MG tablet Take 40 mg by mouth daily.     . polyethylene glycol (MIRALAX / GLYCOLAX) packet Take 17 g by mouth daily as needed (constipation).     . Prenatal Vit-Fe Fumarate-FA (PREPLUS) 27-1 MG TABS Take 1 tablet by mouth daily.     . sertraline (ZOLOFT) 50 MG tablet Take 50 mg by mouth daily.    . simvastatin (ZOCOR) 20 MG tablet Take 20 mg by mouth at bedtime.    . solifenacin (VESICARE) 5 MG tablet Take 5 mg by mouth daily.    . tamsulosin (FLOMAX) 0.4 MG CAPS capsule Take 0.4 mg by mouth at bedtime.     No current facility-administered medications for this visit.     Physical Exam: BP (!) 160/78 (BP Location: Right Arm)   Pulse 61   Temp 97.9 F (36.6 C) (Skin)   Resp 20   Ht 5\' 9"  (1.753 m)   Wt 165 lb (74.8 kg)   SpO2 94% Comment: RA  BMI 24.37 kg/m  He is in a wheelchair due to his spastic left hemiplegia. Cardiac exam shows regular rate and rhythm with normal heart sounds.  There is no murmur. Lungs are clear.   Diagnostic Tests:  CLINICAL DATA:  Evaluate thoracic aortic aneurysm.  EXAM: CT ANGIOGRAPHY CHEST WITH CONTRAST  TECHNIQUE: Multidetector CT imaging of the chest was performed using the standard protocol during bolus administration of intravenous contrast. Multiplanar CT image reconstructions and MIPs were obtained to evaluate the vascular anatomy.  CONTRAST:  76mL ISOVUE-370 IOPAMIDOL (ISOVUE-370) INJECTION 76%  COMPARISON:  04/25/2018  FINDINGS: Cardiovascular: Aortic atherosclerosis with fusiform dilatation of the ascending thoracic aorta. The ascending thoracic aorta is stable in diameter measuring 4.3 cm, image 78/4. calcifications within the LAD coronary artery noted.  Mediastinum/Nodes: Normal appearance of the thyroid gland. The trachea appears patent  and is midline. Large hiatal hernia. No adenopathy.  Lungs/Pleura: There is compressive type atelectasis and volume loss within the left lower lobe secondary to hiatal hernia. No pleural effusion. No airspace consolidation. No pneumothorax.  Upper Abdomen: No acute abnormality.  Musculoskeletal: No chest wall abnormality. No acute or significant osseous findings.  Review of the MIP images confirms the above findings.  IMPRESSION: 1. Stable appearance of aneurysmal dilatation of the ascending thoracic aorta. Recommend annual imaging followup by CTA or MRA. This recommendation follows 2010 ACCF/AHA/AATS/ACR/ASA/SCA/SCAI/SIR/STS/SVM Guidelines for the Diagnosis and Management of Patients with Thoracic Aortic Disease. Circulation. 2010; 121JN:9224643. Aortic aneurysm NOS (ICD10-I71.9) 2. Aortic Atherosclerosis (ICD10-I70.0). Coronary artery calcifications. 3. Hiatal hernia.   Electronically Signed   By: Kerby Moors M.D.   On: 05/07/2019 15:21   Impression:  He has a stable 4.3 cm fusiform ascending aortic aneurysm which is not changed since 2015.  I reviewed the CT images with him and answered his questions.  I think it is reasonable to do a follow-up scan in 2 years since this is only 4.3 cm, has been stable, and he is 78 years old with a prior stroke with severe deficit.  I discussed the importance of continued good blood pressure control and preventing further enlargement of his aneurysm and acute aortic dissection.     Plan:  I will see him back in 2 years with a CTA of the chest.  I spent 15 minutes performing this established patient evaluation and > 50% of this time was spent face to face counseling and coordinating the care of this patient's aortic aneurysm.    Gaye Pollack, MD [facility] [phone]

## 2019-10-14 ENCOUNTER — Other Ambulatory Visit: Payer: Self-pay | Admitting: Ophthalmology

## 2019-10-22 ENCOUNTER — Encounter (HOSPITAL_COMMUNITY): Payer: Self-pay | Admitting: Ophthalmology

## 2019-10-22 NOTE — Progress Notes (Signed)
Patient resides at [facility] [phone], spoke with Nurse Wells Guiles. Per Dr Baird Cancer Nurse Delsa Sale, patient was suppose to stop Aggrenox 1 week prior to surgery.  Nurse Wells Guiles at [facility] stated last dose of Aggrenox was today at 8 am.  They did not stop this medication.  Surgery cancelled and to be rescheduled next week.  Nurse Delsa Sale to call [facility] and the OR desk to reschedule.   Cardiologist - Dr Cyndia Bent  Chest x-ray - n/a EKG - 01/30/19 Stress Test - 04/05/14 ECHO TEE - 04/03/14 Cardiac Cath - n/a

## 2019-10-22 NOTE — Anesthesia Preprocedure Evaluation (Addendum)
Anesthesia Evaluation  Patient identified by MRN, date of birth, ID band Patient awake    Reviewed: Allergy & Precautions, H&P , NPO status , Patient's Chart, lab work & pertinent test results  Airway Mallampati: II   Neck ROM: full    Dental   Pulmonary COPD, former smoker,    breath sounds clear to auscultation       Cardiovascular hypertension, + CAD and +CHF   Rhythm:regular Rate:Normal     Neuro/Psych PSYCHIATRIC DISORDERS Depression  Neuromuscular disease CVA    GI/Hepatic hiatal hernia, GERD  ,  Endo/Other    Renal/GU      Musculoskeletal  (+) Arthritis ,   Abdominal   Peds  Hematology   Anesthesia Other Findings   Reproductive/Obstetrics                            Anesthesia Physical Anesthesia Plan  ASA: III  Anesthesia Plan: MAC   Post-op Pain Management:    Induction: Intravenous  PONV Risk Score and Plan: 1 and Ondansetron, Propofol infusion and Treatment may vary due to age or medical condition  Airway Management Planned: Simple Face Mask  Additional Equipment:   Intra-op Plan:   Post-operative Plan:   Informed Consent: I have reviewed the patients History and Physical, chart, labs and discussed the procedure including the risks, benefits and alternatives for the proposed anesthesia with the patient or authorized representative who has indicated his/her understanding and acceptance.       Plan Discussed with: CRNA, Anesthesiologist and Surgeon  Anesthesia Plan Comments: (PAT note by Karoline Caldwell, PA-C: Hx of stable 4.3 cm fusiform ascending aortic aneurysm which is not changed since 2015.  This is followed by Dr. Cyndia Bent.  Last seen 05/07/2019 and was recommended to do a follow-up scan in 2 years given stability of the aneurysm.  History of prior CVA 2016 with residual left sided spastic hemiplegia and dysphagia.  We will need day of surgery labs and  eval.  EKG 01/30/2019: Sinus bradycardia.  Rate 58. Minimal voltage criteria for LVH, may be normal variant  CT angio chest 05/07/2019: IMPRESSION: 1. Stable appearance of aneurysmal dilatation of the ascending thoracic aorta. Recommend annual imaging followup by CTA or MRA. This recommendation follows 2010 ACCF/AHA/AATS/ACR/ASA/SCA/SCAI/SIR/STS/SVM Guidelines for the Diagnosis and Management of Patients with Thoracic Aortic Disease. Circulation. 2010; 121: J696-V893. Aortic aneurysm NOS (ICD10-I71.9) 2. Aortic Atherosclerosis (ICD10-I70.0). Coronary artery calcifications. 3. Hiatal hernia.   TTE 04/03/2014: - Left ventricle: The cavity size was normal. Wall thickness was  normal. Systolic function was normal. The estimated ejection  fraction was in the range of 60% to 65%. Doppler parameters are  consistent with abnormal left ventricular relaxation (grade 1  diastolic dysfunction).  - Aortic valve: There was mild regurgitation.  - Right ventricle: RV appears mildly dilated. and function appears  mildly depressed. The cavity size was mildly dilated. )       Anesthesia Quick Evaluation

## 2019-10-22 NOTE — Progress Notes (Addendum)
Anesthesia Chart Review: Same day workup  Hx of stable 4.3 cm fusiform ascending aortic aneurysm which is not changed since 2015.  This is followed by Dr. Cyndia Bent.  Last seen 05/07/2019 and was recommended to do a follow-up scan in 2 years given stability of the aneurysm.  History of prior CVA 2016 with residual left sided spastic hemiplegia and dysphagia.  We will need day of surgery labs and eval.  Addendum: Pt did not stop aggrenox. Per Dr. Baird Cancer, pt needs to be off for 1 week prior to surgery. Surgery will be postponed.   EKG 01/30/2019: Sinus bradycardia.  Rate 58. Minimal voltage criteria for LVH, may be normal variant  CT angio chest 05/07/2019: IMPRESSION: 1. Stable appearance of aneurysmal dilatation of the ascending thoracic aorta. Recommend annual imaging followup by CTA or MRA. This recommendation follows 2010 ACCF/AHA/AATS/ACR/ASA/SCA/SCAI/SIR/STS/SVM Guidelines for the Diagnosis and Management of Patients with Thoracic Aortic Disease. Circulation. 2010; 121JN:9224643. Aortic aneurysm NOS (ICD10-I71.9) 2. Aortic Atherosclerosis (ICD10-I70.0). Coronary artery calcifications. 3. Hiatal hernia.   TTE 04/03/2014: - Left ventricle: The cavity size was normal. Wall thickness was  normal. Systolic function was normal. The estimated ejection  fraction was in the range of 60% to 65%. Doppler parameters are  consistent with abnormal left ventricular relaxation (grade 1  diastolic dysfunction).  - Aortic valve: There was mild regurgitation.  - Right ventricle: RV appears mildly dilated. and function appears  mildly depressed. The cavity size was mildly dilated.    Wynonia Musty [facility]/Anesthesiology Phone [phone] 10/22/2019 1:44 PM

## 2019-10-30 ENCOUNTER — Ambulatory Visit: Admit: 2019-10-30 | Payer: Medicare Other | Admitting: Ophthalmology

## 2019-10-30 SURGERY — PARS PLANA VITRECTOMY WITH 25 GAUGE
Anesthesia: Monitor Anesthesia Care | Laterality: Right

## 2019-11-06 ENCOUNTER — Ambulatory Visit: Payer: Medicare Other | Admitting: Neurology

## 2019-11-11 ENCOUNTER — Encounter (HOSPITAL_COMMUNITY): Payer: Self-pay | Admitting: Ophthalmology

## 2019-11-11 ENCOUNTER — Other Ambulatory Visit: Payer: Self-pay

## 2019-11-11 NOTE — Progress Notes (Signed)
Pt is a resident at [facility]. Spoke with Jacqlyn Larsen, LPN for pre-op call. She states pt is alert, oriented and able to speak for himself. She states if he comes by the facility [facility], pt will be in a wheelchair. If he comes by ambulance, he won't be in a wheelchair. Pt is on Aggrenox and Dr. Baird Cancer had told the SNF to hold it for a week. Jacqlyn Larsen states the last dose was 11/07/19 (PM dose).   I faxed his pre-op instructions to [facility] [place].  Pt will need a Covid test on arrival day of surgery.

## 2019-11-11 NOTE — Pre-Procedure Instructions (Signed)
Gregory Kerr   11/11/2019    Mr. Mcdiarmid procedure is scheduled on Thursday, 11/13/19 at 3:45 PM.   Report to [facility] Entrance "A" Admitting Office at 12:45 PM.   Call this number if you have problems the morning of surgery: [phone]   Remember:  Mr. Panico is not to eat or drink after midnight Wednesday, 11/12/19.  Take these medicines the morning of surgery with A SIP OF WATER: Baclofen (Lioresal), dextromethorphan-Quinidine (Nuedexta), Gabapentin (Neurontin), Metoprolol Tartrate (Lopressor), Pantoprazole (Protonix), Sertraline (Zoloft), Solifenacin (Vesicare), Acetaminophen (Tylenol) - prn, NTG - prn  Please hold Multivitamins as of today prior to surgery. Continue to hold Dipyridamole-Aspirin.   Per instructions from Dr. Ether Griffins, Anesthesiologist/Sallie Maker Stan Head, RN    Do not wear jewelry.  Do not wear lotions, powders, cologne or deodorant.  Men may shave face and neck.  Do not bring valuables to the hospital.  [facility] is not responsible for any belongings or valuables.  Contacts, dentures or bridgework may not be worn into surgery.  Leave your suitcase in the car.  After surgery it may be brought to your room.  For patients admitted to the hospital, discharge time will be determined by your treatment team.  Patients discharged the day of surgery will not be allowed to drive home.   Any questions today, please call me, Lilia Pro, RN at [phone]

## 2019-11-13 ENCOUNTER — Encounter (HOSPITAL_COMMUNITY)
Admission: RE | Disposition: A | Discharge status: Skilled Nursing Facility | Payer: Self-pay | Source: Home / Self Care | Attending: Ophthalmology

## 2019-11-13 ENCOUNTER — Ambulatory Visit (HOSPITAL_COMMUNITY): Payer: Medicare Other | Admitting: Physician Assistant

## 2019-11-13 ENCOUNTER — Ambulatory Visit (HOSPITAL_COMMUNITY)
Admission: RE | Admit: 2019-11-13 | Discharge: 2019-11-13 | Disposition: A | Discharge status: Skilled Nursing Facility | Payer: Medicare Other | Attending: Ophthalmology | Admitting: Ophthalmology

## 2019-11-13 ENCOUNTER — Encounter (HOSPITAL_COMMUNITY): Payer: Self-pay | Admitting: Ophthalmology

## 2019-11-13 ENCOUNTER — Other Ambulatory Visit: Payer: Self-pay

## 2019-11-13 DIAGNOSIS — Z87891 Personal history of nicotine dependence: Secondary | ICD-10-CM | POA: Insufficient documentation

## 2019-11-13 DIAGNOSIS — Z8673 Personal history of transient ischemic attack (TIA), and cerebral infarction without residual deficits: Secondary | ICD-10-CM | POA: Diagnosis not present

## 2019-11-13 DIAGNOSIS — E785 Hyperlipidemia, unspecified: Secondary | ICD-10-CM | POA: Diagnosis not present

## 2019-11-13 DIAGNOSIS — I10 Essential (primary) hypertension: Secondary | ICD-10-CM | POA: Diagnosis not present

## 2019-11-13 DIAGNOSIS — Z7982 Long term (current) use of aspirin: Secondary | ICD-10-CM | POA: Insufficient documentation

## 2019-11-13 DIAGNOSIS — H2640 Unspecified secondary cataract: Secondary | ICD-10-CM | POA: Diagnosis not present

## 2019-11-13 DIAGNOSIS — Z20822 Contact with and (suspected) exposure to covid-19: Secondary | ICD-10-CM | POA: Diagnosis not present

## 2019-11-13 DIAGNOSIS — Z79899 Other long term (current) drug therapy: Secondary | ICD-10-CM | POA: Diagnosis not present

## 2019-11-13 DIAGNOSIS — K219 Gastro-esophageal reflux disease without esophagitis: Secondary | ICD-10-CM | POA: Insufficient documentation

## 2019-11-13 DIAGNOSIS — H43391 Other vitreous opacities, right eye: Secondary | ICD-10-CM | POA: Diagnosis not present

## 2019-11-13 HISTORY — PX: PARS PLANA VITRECTOMY: SHX2166

## 2019-11-13 LAB — BASIC METABOLIC PANEL
Anion gap: 7 (ref 5–15)
BUN: 13 mg/dL (ref 8–23)
CO2: 24 mmol/L (ref 22–32)
Calcium: 8.3 mg/dL — ABNORMAL LOW (ref 8.9–10.3)
Chloride: 108 mmol/L (ref 98–111)
Creatinine, Ser: 0.71 mg/dL (ref 0.61–1.24)
GFR calc Af Amer: >60 mL/min (ref >60)
GFR calc non Af Amer: >60 mL/min (ref >60)
Glucose, Bld: 89 mg/dL (ref 70–99)
Potassium: 3.9 mmol/L (ref 3.5–5.1)
Sodium: 139 mmol/L (ref 135–145)

## 2019-11-13 LAB — SARS CORONAVIRUS 2 BY RT PCR (HOSPITAL ORDER, PERFORMED IN ~~LOC~~ HOSPITAL LAB): SARS Coronavirus 2: NEGATIVE

## 2019-11-13 SURGERY — PARS PLANA VITRECTOMY WITH 25 GAUGE
Anesthesia: Monitor Anesthesia Care | Site: Eye | Laterality: Right

## 2019-11-13 MED ORDER — LIDOCAINE HCL (PF) 2 % IJ SOLN
INTRAMUSCULAR | Status: AC
  Filled 2019-11-13: qty 10

## 2019-11-13 MED ORDER — CHLORHEXIDINE GLUCONATE 0.12 % MT SOLN
15.0000 mL | Freq: Once | OROMUCOSAL | Status: AC
  Administered 2019-11-13: 15 mL via OROMUCOSAL
  Filled 2019-11-13: qty 15

## 2019-11-13 MED ORDER — VANCOMYCIN SUBCONJUNCTIVAL INJECTION 25 MG/0.5 ML
25.0000 mg | INTRAOCULAR | Status: DC
  Filled 2019-11-13: qty 0.5

## 2019-11-13 MED ORDER — SODIUM CHLORIDE 0.9 % IV SOLN: INTRAVENOUS | Status: DC

## 2019-11-13 MED ORDER — ONDANSETRON HCL 4 MG/2ML IJ SOLN
INTRAMUSCULAR | Status: AC
  Filled 2019-11-13: qty 8

## 2019-11-13 MED ORDER — LACTATED RINGERS IV SOLN: INTRAVENOUS | Status: DC

## 2019-11-13 MED ORDER — DEXAMETHASONE SODIUM PHOSPHATE 10 MG/ML IJ SOLN
INTRAMUSCULAR | Status: DC | PRN
  Administered 2019-11-13: 10 mg

## 2019-11-13 MED ORDER — BUPIVACAINE HCL (PF) 0.75 % IJ SOLN
INTRAMUSCULAR | Status: AC
  Filled 2019-11-13: qty 10

## 2019-11-13 MED ORDER — EPINEPHRINE PF 1 MG/ML IJ SOLN
INTRAMUSCULAR | Status: AC
  Filled 2019-11-13: qty 1

## 2019-11-13 MED ORDER — ORAL CARE MOUTH RINSE: 15.0000 mL | Freq: Once | OROMUCOSAL | Status: AC

## 2019-11-13 MED ORDER — EPINEPHRINE PF 1 MG/ML IJ SOLN
INTRAOCULAR | Status: DC | PRN
  Administered 2019-11-13: 500.3 mL

## 2019-11-13 MED ORDER — TOBRAMYCIN-DEXAMETHASONE 0.3-0.1 % OP OINT
TOPICAL_OINTMENT | OPHTHALMIC | Status: DC | PRN
  Administered 2019-11-13: 1 via OPHTHALMIC

## 2019-11-13 MED ORDER — VANCOMYCIN SUBCONJUNCTIVAL INJECTION 25 MG/0.5 ML
INTRAOCULAR | Status: DC | PRN
  Administered 2019-11-13: 25 mg via SUBCONJUNCTIVAL

## 2019-11-13 MED ORDER — ATROPINE SULFATE 1 % OP SOLN
OPHTHALMIC | Status: DC | PRN
  Administered 2019-11-13: 1 [drp] via OPHTHALMIC

## 2019-11-13 MED ORDER — LIDOCAINE HCL 2 % IJ SOLN
INTRAMUSCULAR | Status: DC | PRN
  Administered 2019-11-13: 7 mL via OPHTHALMIC

## 2019-11-13 MED ORDER — HYPROMELLOSE (GONIOSCOPIC) 2.5 % OP SOLN
OPHTHALMIC | Status: DC | PRN
Start: 2019-11-13 — End: 2019-11-13
  Administered 2019-11-13: 10 [drp] via OPHTHALMIC

## 2019-11-13 MED ORDER — ATROPINE SULFATE 1 % OP SOLN
1.0000 [drp] | OPHTHALMIC | Status: DC | PRN
  Administered 2019-11-13: 1 [drp] via OPHTHALMIC
  Filled 2019-11-13: qty 5

## 2019-11-13 MED ORDER — PROPOFOL 10 MG/ML IV BOLUS
INTRAVENOUS | Status: AC
  Filled 2019-11-13: qty 20

## 2019-11-13 MED ORDER — BSS PLUS IO SOLN
INTRAOCULAR | Status: DC | PRN
  Administered 2019-11-13: 1 via INTRAOCULAR

## 2019-11-13 MED ORDER — PHENYLEPHRINE HCL 2.5 % OP SOLN
1.0000 [drp] | OPHTHALMIC | Status: AC | PRN
  Administered 2019-11-13 (×2): 1 [drp] via OPHTHALMIC

## 2019-11-13 MED ORDER — BSS PLUS IO SOLN
INTRAOCULAR | Status: AC
  Filled 2019-11-13: qty 500

## 2019-11-13 MED ORDER — HYALURONIDASE HUMAN 150 UNIT/ML IJ SOLN
INTRAMUSCULAR | Status: AC
  Filled 2019-11-13: qty 1

## 2019-11-13 MED ORDER — HYPROMELLOSE (GONIOSCOPIC) 2.5 % OP SOLN
OPHTHALMIC | Status: AC
  Filled 2019-11-13: qty 15

## 2019-11-13 MED ORDER — TETRACAINE HCL 0.5 % OP SOLN
OPHTHALMIC | Status: AC
  Filled 2019-11-13: qty 4

## 2019-11-13 MED ORDER — BSS IO SOLN
INTRAOCULAR | Status: AC
  Filled 2019-11-13: qty 15

## 2019-11-13 MED ORDER — DEXAMETHASONE SODIUM PHOSPHATE 10 MG/ML IJ SOLN
INTRAMUSCULAR | Status: AC
  Filled 2019-11-13: qty 1

## 2019-11-13 MED ORDER — ATROPINE SULFATE 1 % OP SOLN
OPHTHALMIC | Status: AC
  Filled 2019-11-13: qty 5

## 2019-11-13 MED ORDER — PHENYLEPHRINE HCL 2.5 % OP SOLN
1.0000 [drp] | OPHTHALMIC | Status: DC | PRN
  Administered 2019-11-13: 1 [drp] via OPHTHALMIC
  Filled 2019-11-13: qty 2

## 2019-11-13 MED ORDER — TOBRAMYCIN-DEXAMETHASONE 0.3-0.1 % OP OINT
TOPICAL_OINTMENT | OPHTHALMIC | Status: AC
  Filled 2019-11-13: qty 3.5

## 2019-11-13 MED ORDER — ATROPINE SULFATE 1 % OP SOLN
1.0000 [drp] | OPHTHALMIC | Status: AC | PRN
  Administered 2019-11-13 (×2): 1 [drp] via OPHTHALMIC

## 2019-11-13 MED ORDER — PROPOFOL 10 MG/ML IV BOLUS
INTRAVENOUS | Status: DC | PRN
  Administered 2019-11-13 (×2): 40 mg via INTRAVENOUS

## 2019-11-13 SURGICAL SUPPLY — 59 items
BAND WRIST GAS GREEN (MISCELLANEOUS) IMPLANT
BLADE MVR KNIFE 20G (BLADE) IMPLANT
BNDG EYE OVAL (GAUZE/BANDAGES/DRESSINGS) ×2 IMPLANT
CANNULA ANT CHAM MAIN (OPHTHALMIC RELATED) IMPLANT
CANNULA ANTERIOR CHAMBER 27GA (MISCELLANEOUS) IMPLANT
CANNULA DUAL BORE 23G (CANNULA) IMPLANT
CANNULA DUALBORE 25G (CANNULA) IMPLANT
CANNULA VLV SOFT TIP 25GA (OPHTHALMIC) ×2 IMPLANT
CAUTERY EYE LOW TEMP 1300F FIN (OPHTHALMIC RELATED) IMPLANT
CLSR STERI-STRIP ANTIMIC 1/2X4 (GAUZE/BANDAGES/DRESSINGS) ×2 IMPLANT
CORD BIPOLAR FORCEPS 12FT (ELECTRODE) ×2 IMPLANT
COVER MAYO STAND STRL (DRAPES) IMPLANT
COVER WAND RF STERILE (DRAPES) ×2 IMPLANT
DRAPE HALF SHEET 40X57 (DRAPES) ×2 IMPLANT
DRAPE INCISE 51X51 W/FILM STRL (DRAPES) IMPLANT
DRAPE RETRACTOR (MISCELLANEOUS) ×2 IMPLANT
ERASER HMR WETFIELD 23G BP (MISCELLANEOUS) IMPLANT
FILTER BLUE MILLIPORE (MISCELLANEOUS) IMPLANT
FORCEPS ECKARDT ILM 25G SERR (OPHTHALMIC RELATED) IMPLANT
FORCEPS GRIESHABER ILM 25G A (INSTRUMENTS) IMPLANT
GAS AUTO FILL CONSTEL (OPHTHALMIC)
GAS AUTO FILL CONSTELLATION (OPHTHALMIC) IMPLANT
GAS WRIST BAND GREEN (MISCELLANEOUS)
GLOVE BIO SURGEON STRL SZ7.5 (GLOVE) ×2 IMPLANT
GOWN STRL REUS W/ TWL LRG LVL3 (GOWN DISPOSABLE) ×2 IMPLANT
GOWN STRL REUS W/TWL LRG LVL3 (GOWN DISPOSABLE) ×2
HANDLE PNEUMATIC FOR CONSTEL (OPHTHALMIC) IMPLANT
KIT BASIN OR (CUSTOM PROCEDURE TRAY) ×2 IMPLANT
KIT TURNOVER KIT B (KITS) IMPLANT
LENS BIOM SUPER VIEW SET DISP (MISCELLANEOUS) ×2 IMPLANT
MICROPICK 25G (MISCELLANEOUS)
NEEDLE 18GX1X1/2 (RX/OR ONLY) (NEEDLE) ×2 IMPLANT
NEEDLE 25GX 5/8IN NON SAFETY (NEEDLE) ×2 IMPLANT
NEEDLE FILTER BLUNT 18X 1/2SAF (NEEDLE)
NEEDLE FILTER BLUNT 18X1 1/2 (NEEDLE) IMPLANT
NEEDLE HYPO 25GX1X1/2 BEV (NEEDLE) IMPLANT
NEEDLE HYPO 30X.5 LL (NEEDLE) ×2 IMPLANT
NEEDLE RETROBULBAR 25GX1.5 (NEEDLE) ×2 IMPLANT
NS IRRIG 1000ML POUR BTL (IV SOLUTION) ×2 IMPLANT
PAD ARMBOARD 7.5X6 YLW CONV (MISCELLANEOUS) ×4 IMPLANT
PAK PIK VITRECTOMY CVS 25GA (OPHTHALMIC) ×4 IMPLANT
PENCIL BIPOLAR 25GA STR DISP (OPHTHALMIC RELATED) IMPLANT
PICK MICROPICK 25G (MISCELLANEOUS) IMPLANT
PROBE LASER ILLUM FLEX CVD 25G (OPHTHALMIC) IMPLANT
ROLLS DENTAL (MISCELLANEOUS) IMPLANT
SCRAPER DIAMOND 25GA (OPHTHALMIC RELATED) IMPLANT
SHIELD EYE LENSE ONLY DISP (GAUZE/BANDAGES/DRESSINGS) ×2 IMPLANT
STOCKINETTE IMPERVIOUS 9X36 MD (GAUZE/BANDAGES/DRESSINGS) ×4 IMPLANT
STOPCOCK 4 WAY LG BORE MALE ST (IV SETS) IMPLANT
SUT ETHILON 8 0 TG100 8 (SUTURE) IMPLANT
SUT VICRYL 7 0 TG140 8 (SUTURE) IMPLANT
SUT VICRYL 8 0 TG140 8 (SUTURE) IMPLANT
SUT VICRYL ABS 6-0 S29 18IN (SUTURE) IMPLANT
SYR 10ML LL (SYRINGE) IMPLANT
SYR 20ML LL LF (SYRINGE) ×2 IMPLANT
SYR 5ML LL (SYRINGE) ×2 IMPLANT
SYR TB 1ML LUER SLIP (SYRINGE) IMPLANT
TUBE CONNECTING 12X1/4 (SUCTIONS) IMPLANT
WATER STERILE IRR 1000ML POUR (IV SOLUTION) ×2 IMPLANT

## 2019-11-13 NOTE — Brief Op Note (Signed)
11/13/2019  5:27 PM  PATIENT:  Gregory Kerr  79 y.o. male  PRE-OPERATIVE DIAGNOSIS:  VITREOUS OPACITIES, OTHER SECONDARY CATARACT  POST-OPERATIVE DIAGNOSIS:  VITREOUS OPACITIES, OTHER SECONDARY CATARACT  PROCEDURE:  Procedure(s): PARS PLANA VITRECTOMY WITH 25 GAUGE WITH REMOVAL OF POSTERIOR CAPSULAR OPACIFICATION WITH VITRECTOR (Right)  SURGEON:  Surgeon(s) and Role:    Sherlynn Stalls, MD - Primary  PHYSICIAN ASSISTANT:   ASSISTANTS: none   ANESTHESIA:   MAC  EBL:  Minimal    BLOOD ADMINISTERED:none  DRAINS: none   LOCAL MEDICATIONS USED:  BUPIVICAINE   SPECIMEN:  No Specimen  DISPOSITION OF SPECIMEN:  N/A  COUNTS:  YES  TOURNIQUET:  * No tourniquets in log *  DICTATION: .Other Dictation: Dictation Number [id number]  PLAN OF CARE: Discharge to home after PACU  PATIENT DISPOSITION:  PACU - hemodynamically stable.   Delay start of Pharmacological VTE agent (>24hrs) due to surgical blood loss or risk of bleeding: not applicable

## 2019-11-13 NOTE — Progress Notes (Addendum)
Spoke with [facility] LPN at [facility]. Proctor's caregiver. Per [employer] at [facility] has arranged transport for Gregory Kerr to go back to facility. Transport to call PACU when they arrive at hospital.  Discharge instructions reviewed with [facility] LPN at [facility] and copy sent with patient. Becky aware of pt's appointment with dr. Baird Cancer tomorrow am

## 2019-11-13 NOTE — H&P (Signed)
Gregory Kerr is an 79 y.o. male.   Chief Complaint: decreased vision OD HPI: dx with PCO and dense vitreous opacification OD  Past Medical History:  Diagnosis Date  . GERD (gastroesophageal reflux disease)   . Hernia    "large one; on my left stomach" (09/22/2014)  . Hiatal hernia 10/24/2012  . HTN (hypertension) 10/24/2012  . Hyperlipidemia   . Hypertension   . 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 Osteoporosis, unspecified 10/24/2012  . 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 Pneumonia    "once or twice" (09/22/2014)  . Retinal detachment    left eye  . Stroke [facility]) 2007   "left side doesn't work now" (09/22/2014)    Past Surgical History:  Procedure Laterality Date  . APPENDECTOMY  05/27/2009   Ronnald Collum, MD  . CATARACT EXTRACTION W/ INTRAOCULAR LENS  IMPLANT, BILATERAL Bilateral ?2014  . CHOLECYSTECTOMY N/A 09/26/2014   Procedure: LAPAROSCOPIC CHOLECYSTECTOMY WITH INTRAOPERATIVE CHOLANGIOGRAM;  Surgeon: Donnie Mesa, MD;  Location: [place];  Service: General;  Laterality: N/A;  . COLONOSCOPY WITH PROPOFOL N/A 12/29/2015   Procedure: COLONOSCOPY WITH PROPOFOL;  Surgeon: Wilford Corner, MD;  Location: [facility] ENDOSCOPY;  Service: Endoscopy;  Laterality: N/A;  . ERCP N/A 09/25/2014   Procedure: ENDOSCOPIC RETROGRADE CHOLANGIOPANCREATOGRAPHY (ERCP);  Surgeon: Clarene Essex, MD;  Location: [facility] ENDOSCOPY;  Service: Endoscopy;  Laterality: N/A;  . PARS PLANA VITRECTOMY Left 01/30/2019   Procedure: PARS PLANA VITRECTOMY WITH 25 GAUGE;  Surgeon: Sherlynn Stalls, MD;  Location: [place];  Service: Ophthalmology;  Laterality: Left;  . PHOTOCOAGULATION WITH LASER Left 01/30/2019   Procedure: PHOTOCOAGULATION WITH LASER;  Surgeon: Sherlynn Stalls, MD;  Location: [place];  Service: Ophthalmology;  Laterality: Left;    Family History  Problem Relation Age of Onset  . Heart attack Mother   . Heart attack Father   . Cancer Maternal Aunt        Stomach  . Cancer Paternal Aunt        Uterine,Colon   Social History:   reports that he quit smoking about 13 years ago. His smoking use included cigarettes. He smoked 1.00 pack per day. He has never used smokeless tobacco. He reports previous alcohol use. He reports that he does not use drugs.  Allergies:  Allergies  Allergen Reactions  . Penicillins Rash    ++++Patient in nursing home. Unable to clarify details/severity of the reaction.++++    Medications Prior to Admission  Medication Sig Dispense Refill  . acetaminophen (TYLENOL) 325 MG tablet Take 650 mg by mouth every 4 (four) hours as needed for mild pain or headache. Not to exceed more that 3000 mg in 24 hrs    . baclofen (LIORESAL) 10 MG tablet Take 10 mg by mouth 4 (four) times daily.     . calcium-vitamin D (OSCAL WITH D) 500-200 MG-UNIT tablet Take 1 tablet by mouth 2 (two) times daily.    . Cholecalciferol 2000 units TABS Take 2,000 Units by mouth daily.    Marland Kitchen Dextromethorphan-Quinidine (NUEDEXTA) 20-10 MG CAPS Take 1 capsule by mouth daily. For Pseudobulbar Affect (PBA)    . dipyridamole-aspirin (AGGRENOX) 200-25 MG per 12 hr capsule Take 1 capsule by mouth 2 (two) times daily. for CVA    . gabapentin (NEURONTIN) 100 MG capsule Take 100 mg by mouth 3 (three) times daily.    Marland Kitchen ketorolac (ACULAR) 0.4 % SOLN Place 1 drop into both eyes daily.    Marland Kitchen loperamide (IMODIUM) 2 MG capsule Take 2-4 mg by mouth See  admin instructions. Take 4mg  after the initial loose stool then take 2mg  after each loose stool. Do not exceed 16mg  in a 24 hour period.    . metoprolol tartrate (LOPRESSOR) 25 MG tablet Take 0.5 tablets (12.5 mg total) by mouth 2 (two) times daily.    . pantoprazole (PROTONIX) 40 MG tablet Take 40 mg by mouth daily.     . prednisoLONE acetate (PRED FORTE) 1 % ophthalmic suspension Place 1 drop into both eyes daily.    . Prenatal Vit-Fe Fumarate-FA (PREPLUS) 27-1 MG TABS Take 1 tablet by mouth daily.     . sertraline (ZOLOFT) 50 MG tablet Take 50 mg by mouth daily.    . simvastatin (ZOCOR) 20 MG  tablet Take 20 mg by mouth at bedtime.    . solifenacin (VESICARE) 5 MG tablet Take 5 mg by mouth daily.    . tamsulosin (FLOMAX) 0.4 MG CAPS capsule Take 0.4 mg by mouth at bedtime.    Marland Kitchen denosumab (PROLIA) 60 MG/ML SOLN injection Inject 60 mg into the skin every 6 (six) months. Administer in upper arm, thigh, or abdomen  ON THE 13TH April and Oct.    . nitroGLYCERIN (NITROSTAT) 0.4 MG SL tablet Place 0.4 mg under the tongue every 5 (five) minutes as needed for chest pain.    . polyethylene glycol (MIRALAX / GLYCOLAX) packet Take 17 g by mouth daily as needed (constipation).       Results for orders placed or performed during the hospital encounter of 11/13/19 (from the past 48 hour(s))  SARS Coronavirus 2 by RT PCR (hospital order, performed in [facility] hospital lab) Nasopharyngeal Nasopharyngeal Swab     Status: None   Collection Time: 11/13/19  1:09 PM   Specimen: Nasopharyngeal Swab  Result Value Ref Range   SARS Coronavirus 2 NEGATIVE NEGATIVE    Comment: (NOTE) SARS-CoV-2 target nucleic acids are NOT DETECTED.  The SARS-CoV-2 RNA is generally detectable in upper and lower respiratory specimens during the acute phase of infection. The lowest concentration of SARS-CoV-2 viral copies this assay can detect is 250 copies / mL. A negative result does not preclude SARS-CoV-2 infection and should not be used as the sole basis for treatment or other patient management decisions.  A negative result may occur with improper specimen collection / handling, submission of specimen other than nasopharyngeal swab, presence of viral mutation(s) within the areas targeted by this assay, and inadequate number of viral copies (<250 copies / mL). A negative result must be combined with clinical observations, patient history, and epidemiological information.  Fact Sheet for Patients:   StrictlyIdeas.no  Fact Sheet for Healthcare  Providers: BankingDealers.co.za  This test is not yet approved or  cleared by the [place] FDA and has been authorized for detection and/or diagnosis of SARS-CoV-2 by FDA under an Emergency Use Authorization (EUA).  This EUA will remain in effect (meaning this test can be used) for the duration of the COVID-19 declaration under Section 564(b)(1) of the Act, 21 U.S.C. section 360bbb-3(b)(1), unless the authorization is terminated or revoked sooner.  Performed at [facility], [place] [address], [place], [place] [address]   Basic metabolic panel per protocol     Status: Abnormal   Collection Time: 11/13/19  1:44 PM  Result Value Ref Range   Sodium 139 135 - 145 mmol/L   Potassium 3.9 3.5 - 5.1 mmol/L   Chloride 108 98 - 111 mmol/L   CO2 24 22 - 32 mmol/L  Glucose, Bld 89 70 - 99 mg/dL    Comment: Glucose reference range applies only to samples taken after fasting for at least 8 hours.   BUN 13 8 - 23 mg/dL   Creatinine, Ser 0.71 0.61 - 1.24 mg/dL   Calcium 8.3 (L) 8.9 - 10.3 mg/dL   GFR calc non Af Amer >60 >60 mL/min   GFR calc Af Amer >60 >60 mL/min   Anion gap 7 5 - 15    Comment: Performed at [facility] [address] [place], [place] [address]   No results found.  Review of Systems  Eyes: Positive for visual disturbance.  All other systems reviewed and are negative.   Blood pressure (!) 181/64, pulse 63, temperature (!) 97.1 F (36.2 C), temperature source Tympanic, resp. rate 17, height 5\' 9"  (1.753 m), weight 74.8 kg, SpO2 98 %. Physical Exam  Eyes:    Neurological: He is alert.     Assessment/Plan 1. PCO with dense vitreous opacification OD: PPV/PCco removal OD.  Corliss Parish, MD 11/13/2019, 2:59 PM

## 2019-11-13 NOTE — Transfer of Care (Signed)
Immediate Anesthesia Transfer of Care Note  Patient: Gregory Kerr  Procedure(s) Performed: PARS PLANA VITRECTOMY WITH 25 GAUGE WITH REMOVAL OF POSTERIOR CAPSULAR OPACIFICATION WITH VITRECTOR (Right Eye)  Patient Location: PACU  Anesthesia Type:MAC  Level of Consciousness: awake, alert , oriented and patient cooperative  Airway & Oxygen Therapy: Patient Spontanous Breathing and Patient connected to nasal cannula oxygen  Post-op Assessment: Report given to RN and Post -op Vital signs reviewed and stable  Post vital signs: Reviewed and stable  Last Vitals:  Vitals Value Taken Time  BP 154/73 11/13/19 1726  Temp    Pulse 58 11/13/19 1726  Resp 14 11/13/19 1726  SpO2 94 % 11/13/19 1726  Vitals shown include unvalidated device data.  Last Pain:  Vitals:   11/13/19 1335  TempSrc:   PainSc: 0-No pain         Complications: No complications documented.

## 2019-11-13 NOTE — Discharge Instructions (Signed)
Eye Floaters  Eye floaters are spots in your vision caused by shadows from specks of material that float around inside your eye. This is usually a normal, age-related change in your eye. Floaters may be more obvious when you look up at the sky or at a bright, blank background. Floaters can be annoying, but they do not usually cause vision problems. While floaters may be a normal part of aging, it is important to get an eye exam to make sure that floaters are not a sign of a more serious condition. What are the causes? In most cases, this condition is caused by age-related changes in the eye. As you age, the jelly-like fluid (vitreous) inside the eyeball shrinks and can become stringy. The stringy strands of vitreous cast shadows on the back of the eye (retina), which is where the nerves needed for vision are located. These shadows show up as floaters in your vision. Other possible causes of eye floaters include:  A torn retina.  Injury.  Bleeding inside the eye. Diabetes and other conditions can cause blood vessels in the retina to bleed.  A blood clot in the major vein of the retina or its branches (retinal vein occlusion).  Separation (detachment) of the: ? Retina. ? Vitreous.  Eye inflammation (uveitis).  Eye infection. What increases the risk? You may have a higher risk for floaters if you:  Are older. The risk increases with age.  Are nearsighted.  Have diabetes.  Have had cataracts removed. What are the signs or symptoms? Floaters cause you to see small, shadowy shapes move across your vision. The shapes move as your eyes move. They drift out of your vision when you keep your eyes still. These shapes may look like:  Specks.  Dots.  Circles.  Squiggly lines.  Thread. Sometimes floaters appear along with flashes. Flashes usually occur at the edge of your vision. They may look like:  Bursts of light.  Flashing lights.  Lightning streaks.  What is commonly  referred to as "stars." In some cases, seeing flashes can be a sign of a torn or detached retina, which is a serious condition that requires emergency treatment. How is this diagnosed? Your health care provider may diagnose floaters and flashes based on your symptoms and medical history. An eye care specialist (ophthalmologist) will do an eye exam to determine whether your floaters are a normal part of aging or a warning sign of a more serious eye problem. The specialist may put drops in your eyes to open your pupils wide (dilate) and then use a scope (slit lamp) to look inside your eye. If the specialist is unable to see well enough with a slit lamp, you may need imaging tests such as ultrasound. How is this treated?  If your floaters are the result of aging, you do not need treatment unless they start to affect your vision. Floaters do not go away completely. Most people adapt to the floaters or, over time, the floaters settle below the line of sight.  If a retinal tear or detachment is causing your floaters, you may need surgery.  If you have an underlying condition that is causing floaters, such as an infection, the floaters should go away when that condition is treated.  In rare cases, if the floaters are affecting your vision, surgery to remove the vitreous and replace it with a saltwater solution (vitrectomy) may be considered. Follow these instructions at home:  Take over-the-counter and prescription medicines only as told by your  health care provider.  Do not drive if you have trouble seeing. Ask your health care provider for guidance about when it is and is not safe for you to drive.  Keep all follow-up visits as told by your health care provider. This is important. Contact a health care provider if you:  Have more floaters than usual.  Develop any new symptoms. Get help right away if:  You cannot see well because of your floaters.  You develop flashes along with  floaters.  Your vision suddenly changes, or you lose your vision completely.  It looks as if a curtain is blocking part of your vision. Summary  Eye floaters are spots in your vision caused by specks of material that float around inside your eye.  In most cases, eye floaters are caused by age-related changes in the eye.  Most people do not need treatment for eye floaters unless there is another condition causing the floaters, such as a retinal tear or detachment or an infection. This information is not intended to replace advice given to you by your health care provider. Make sure you discuss any questions you have with your health care provider. Document Revised: 08/29/2018 Document Reviewed: 04/30/2017 Elsevier Patient Education  [place].

## 2019-11-14 ENCOUNTER — Encounter (HOSPITAL_COMMUNITY): Payer: Self-pay | Admitting: Ophthalmology

## 2019-11-14 NOTE — Op Note (Signed)
NAME: Gregory Kerr, Gregory Kerr MEDICAL RECORD VV:[id number] ACCOUNT [id number] DATE OF BIRTH:06/27/1940 FACILITY: [facility] LOCATION: [facility] PHYSICIAN:Tonjua Rossetti Greg Cutter, MD  OPERATIVE REPORT  DATE OF PROCEDURE:  11/13/2019  SURGEON:  Sherlynn Stalls, MD   ANESTHESIA:  Local MAC retrobulbar injection on the right eye.  PREOPERATIVE DIAGNOSES:   1.  Posterior capsule opacification, right eye. 2.  Dense vitreous opacification of the right eye.  POSTOPERATIVE DIAGNOSES:   1.  Posterior capsule opacification, right eye. 2.  Dense vitreous opacification of the right eye.  PROCEDURE:  Pars plana vitrectomy of the right eye with pars plana removal of posterior capsular opacification in the right eye.  COMPLICATIONS:  None.  FINDINGS:  There was dense vitreous opacification and an opacified posterior capsule present in the right eye.  DESCRIPTION OF PROCEDURE:  The patient was identified in the preoperative holding area.  Signed consent was placed on the chart.  He was then taken to the operating room where he was placed under light sedation by the anesthesia team.  At that point in  time, the right eye was anesthetized using a retrobulbar block consisting of a 1:1 mixture of 0.75% bupivacaine and 1% lidocaine with 150 units of Hylenex.  After this was placed, the retrobulbar needle was removed.  After excellent akinesia and  anesthesia was obtained, the right eye was prepped and draped in the usual sterile fashion for ocular surgery.  A Lieberman speculum was placed between the right upper and lower eyelids for exposure.  After this was in place, 25-gauge trocars were used  to introduce transconjunctival cannulas in the superior temporal, superior nasal, and inferior temporal quadrants.  The trocar was removed leaving the cannula in place.  After this was complete, infusion cannula was attached to the inferior temporal  cannula and confirmed to be in the vitreous cavity before it was turned on.  The  infusion cannula was then turned on.  A pars plana vitrector was then introduced through the superotemporal cannula and used to remove the posterior capsular opacification.   Next, a light pipe was placed through the superior nasal cannula.  The BIOM was positioned and the eye underwent a core vitrectomy with the vitreous cutter and light pipe.  Dense vitreous opacities were removed without difficulty.  Once this was  completed, the eye was inspected with scleral depression.  No retinal tears or detachments were seen by careful scleral depressed exam.  Therefore, the cannulas were removed from the sclerotomies.  Sclerotomies were inspected and found to be watertight.   The eye was then treated with subconjunctival injections of 15 mg of vancomycin, 1 mg of dexamethasone.  The eye was then treated topically with 1 drop of 1% atropine and TobraDex ointment.  The speculum was removed.  Eyelids were then cleaned and  closed.  They were then patched and shielded.  The patient was taken to recovery in excellent condition, having tolerated the procedure very well.  CN/NUANCE  D:11/13/2019 T:11/14/2019 JOB:[id number]

## 2019-11-24 NOTE — Anesthesia Postprocedure Evaluation (Signed)
Anesthesia Post Note  Patient: Gregory Kerr  Procedure(s) Performed: PARS PLANA VITRECTOMY WITH 25 GAUGE WITH REMOVAL OF POSTERIOR CAPSULAR OPACIFICATION WITH VITRECTOR (Right Eye)     Patient location during evaluation: PACU Anesthesia Type: MAC Level of consciousness: awake and alert Pain management: pain level controlled Vital Signs Assessment: post-procedure vital signs reviewed and stable Respiratory status: spontaneous breathing, nonlabored ventilation, respiratory function stable and patient connected to nasal cannula oxygen Cardiovascular status: stable and blood pressure returned to baseline Postop Assessment: no apparent nausea or vomiting Anesthetic complications: no   No complications documented.  Last Vitals:  Vitals:   11/13/19 1756 11/13/19 1830  BP: (!) 154/78   Pulse: (!) 59   Resp: 17   Temp:  36.6 C  SpO2: 96%     Last Pain:  Vitals:   11/13/19 1756  TempSrc:   PainSc: 0-No pain                 Kortny Lirette S

## 2019-12-02 ENCOUNTER — Ambulatory Visit (INDEPENDENT_AMBULATORY_CARE_PROVIDER_SITE_OTHER): Payer: Medicare Other | Admitting: Neurology

## 2019-12-02 ENCOUNTER — Other Ambulatory Visit: Payer: Self-pay

## 2019-12-02 ENCOUNTER — Encounter: Payer: Self-pay | Admitting: Neurology

## 2019-12-02 ENCOUNTER — Telehealth: Payer: Self-pay | Admitting: Neurology

## 2019-12-02 VITALS — BP 150/76 | HR 63 | Ht 69.0 in | Wt 165.0 lb

## 2019-12-02 DIAGNOSIS — R29898 Other symptoms and signs involving the musculoskeletal system: Secondary | ICD-10-CM | POA: Diagnosis not present

## 2019-12-02 DIAGNOSIS — I69354 Hemiplegia and hemiparesis following cerebral infarction affecting left non-dominant side: Secondary | ICD-10-CM

## 2019-12-02 NOTE — Telephone Encounter (Signed)
[employer] medicare/medicaid order sent to [facility]. No auth they will reach out to the patient to schedule.  °

## 2019-12-02 NOTE — Progress Notes (Signed)
[facility] [address] [place]. [place] [address] [phone]       OFFICE CONSULT NOTE  [name] Date of Birth:  1941/04/08 Medical Record Number:  [id number]   Referring MD: Gregory Kerr Reason for Referral: Increased left-sided weakness  HPI: Gregory Kerr is a 79 year old pleasant Caucasian male seen today for initial office consultation visit for stroke and increased weakness. History is obtained from the patient and review of referral notes and electronic medical records. No relevant imaging films are available in PACS. He has past medical history of stroke with spastic left hemiplegia, hypertension, hyperlipidemia, benign prostatic hypertrophy, depression and gastroesophageal reflux disease. Patient states he developed right subcortical infarct in 2007 and has had significant residual spastic left hemiplegia. He was in fact seen by Dr. Mary Kerr and received Botox injections which did not show significant benefit and hence he was lost to follow-up since January 2017. He has been living in assisted living section of [facility] for the last 13 years. He is at baseline wheelchair-bound and unable to ambulate. However he has been able to transfer himself out from a wheelchair to his bed and he has noticed that for the last few months he has had increased weakness in his left leg. He is finding it increasing difficulty with activities like standing up, transferring out of bed, pulling his pants up. He feels his left leg may be weaker. He denies any increase slurred speech, double vision, loss of vision, upper extremity weakness or headaches. He has had no recent brain imaging studies or any work-up done for this weakness. He denies any recurrent stroke or TIA symptoms in the last 13 years. He had a transient episode of blurred vision which lasted few days in the past but did not seek medical help. He has had surgery in the right eye for retinal detachment last year which went  well. I was unable to review records from his previous stroke in 2007 or imaging studies today.  ROS:   14 system review of systems is positive for weakness, gait difficulty, spasticity and all other systems negative  PMH:  Past Medical History:  Diagnosis Date  . GERD (gastroesophageal reflux disease)   . Hernia    "large one; on my left stomach" (09/22/2014)  . Hiatal hernia 10/24/2012  . HTN (hypertension) 10/24/2012  . Hyperlipidemia   . Hypertension   . 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 Osteoporosis, unspecified 10/24/2012  . 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 Pneumonia    "once or twice" (09/22/2014)  . Retinal detachment    left eye  . Stroke [facility]) 2007   "left side doesn't work now" (09/22/2014)    Social History:  Social History   Socioeconomic History  . Marital status: Single    Spouse name: Not on file  . Number of children: Not on file  . Years of education: Not on file  . Highest education level: Not on file  Occupational History  . Not on file  Tobacco Use  . Smoking status: Former Smoker    Packs/day: 1.00    Types: Cigarettes    Quit date: 12/20/2005    Years since quitting: 13.9  . Smokeless tobacco: Never Used  Substance and Sexual Activity  . Alcohol use: Not Currently    Comment: 09/22/2014 "stopped in 2007; drank occasionally when I did drink"  . Drug use: No  . Sexual activity: Never  Other Topics Concern  . Not on file  Social History  Narrative  . Not on file   Social Determinants of Health   Financial Resource Strain:   . Difficulty of Paying Living Expenses:   Food Insecurity:   . Worried About Charity fundraiser in the Last Year:   . Arboriculturist in the Last Year:   Transportation Needs:   . Film/video editor (Medical):   Marland Kitchen Lack of Transportation (Non-Medical):   Physical Activity:   . Days of Exercise per Week:   . Minutes of Exercise per Session:   Stress:   . Feeling of Stress :   Social Connections:   . Frequency of  Communication with Friends and Family:   . Frequency of Social Gatherings with Friends and Family:   . Attends Religious Services:   . Active Member of Clubs or Organizations:   . Attends Archivist Meetings:   Marland Kitchen Marital Status:   Intimate Partner Violence:   . Fear of Current or Ex-Partner:   . Emotionally Abused:   Marland Kitchen Physically Abused:   . Sexually Abused:     Medications:   Current Outpatient Medications on File Prior to Visit  Medication Sig Dispense Refill  . acetaminophen (TYLENOL) 325 MG tablet Take 650 mg by mouth every 4 (four) hours as needed for mild pain or headache. Not to exceed more that 3000 mg in 24 hrs    . baclofen (LIORESAL) 10 MG tablet Take 10 mg by mouth 4 (four) times daily.     . calcium-vitamin D (OSCAL WITH D) 500-200 MG-UNIT tablet Take 1 tablet by mouth 2 (two) times daily.    . Cholecalciferol 2000 units TABS Take 2,000 Units by mouth daily.    Marland Kitchen denosumab (PROLIA) 60 MG/ML SOLN injection Inject 60 mg into the skin every 6 (six) months. Administer in upper arm, thigh, or abdomen  ON THE 13TH April and Oct.    . Dextromethorphan-Quinidine (NUEDEXTA) 20-10 MG CAPS Take 1 capsule by mouth daily. For Pseudobulbar Affect (PBA)    . dipyridamole-aspirin (AGGRENOX) 200-25 MG per 12 hr capsule Take 1 capsule by mouth 2 (two) times daily. for CVA    . gabapentin (NEURONTIN) 100 MG capsule Take 100 mg by mouth 3 (three) times daily.    Marland Kitchen ketorolac (ACULAR) 0.4 % SOLN Place 1 drop into both eyes daily.    Marland Kitchen loperamide (IMODIUM) 2 MG capsule Take 2-4 mg by mouth See admin instructions. Take 4mg  after the initial loose stool then take 2mg  after each loose stool. Do not exceed 16mg  in a 24 hour period.    . metoprolol tartrate (LOPRESSOR) 25 MG tablet Take 0.5 tablets (12.5 mg total) by mouth 2 (two) times daily.    . nitroGLYCERIN (NITROSTAT) 0.4 MG SL tablet Place 0.4 mg under the tongue every 5 (five) minutes as needed for chest pain.    . pantoprazole  (PROTONIX) 40 MG tablet Take 40 mg by mouth daily.     . polyethylene glycol (MIRALAX / GLYCOLAX) packet Take 17 g by mouth daily as needed (constipation).     . prednisoLONE acetate (PRED FORTE) 1 % ophthalmic suspension Place 1 drop into both eyes daily.    . Prenatal Vit-Fe Fumarate-FA (PREPLUS) 27-1 MG TABS Take 1 tablet by mouth daily.     . sertraline (ZOLOFT) 50 MG tablet Take 50 mg by mouth daily.    . simvastatin (ZOCOR) 20 MG tablet Take 20 mg by mouth at bedtime.    . solifenacin (VESICARE) 5 MG  tablet Take 5 mg by mouth daily.    . tamsulosin (FLOMAX) 0.4 MG CAPS capsule Take 0.4 mg by mouth at bedtime.     No current facility-administered medications on file prior to visit.    Allergies:   Allergies  Allergen Reactions  . Penicillins Rash    ++++Patient in nursing home. Unable to clarify details/severity of the reaction.++++    Physical Exam General: Pleasant elderly Caucasian male seated, in no evident distress Head: head normocephalic and atraumatic.   Neck: supple with no carotid or supraclavicular bruits Cardiovascular: regular rate and rhythm, no murmurs Musculoskeletal: no deformity Skin:  no rash/petichiae Vascular:  Normal pulses all extremities  Neurologic Exam Mental Status: Awake and fully alert. Oriented to place and time. Recent and remote memory intact. Attention span, concentration and fund of knowledge appropriate. Mood and affect appropriate.  Cranial Nerves: Fundoscopic exam reveals sharp disc margins. Pupils equal, briskly reactive to light. Extraocular movements full without nystagmus. Visual fields full to confrontation. Hearing intact. Facial sensation intact. Mild left lower facial weakness., tongue, palate moves normally and symmetrically.  Motor: Spastic left hemiplegia with 2/5 strength proximally at the shoulders and hips with significant weakness of the left elbow as well as left knee and ankle. Left foot drop with ankle and knee strength 0/5.  Able to bend fingers in the left hand but weakness of the left wrist and elbow. Tone is increased significantly on the left compared to the right. Normal strength on the right side. Left foot drop. Wearing a left ankle brace. Fixed flexion contracture of the left elbow and wrist but not of the fingers Sensory.: intact to touch , pinprick , position and vibratory sensation.  Coordination: Impaired left finger-to-nose and needle coordination. Normal on the right. Gait and Station: Deferred as patient is wheelchair-bound and unable to walk. Reflexes: 2+ and asymmetric and brisker on the left. Toes downgoing.   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 Vascular risk factors of hypertension hyperlipidemia , prior stroke and age     PLAN: I had a long d/w patient about his remote stroke, spastic left hemiplegia with recent increase left leg weakness and differential diagnosis for that, risk for recurrent stroke/TIAs, personally independently reviewed imaging studies and stroke evaluation results and answered questions.Continue Aggrenox twice daily for secondary stroke prevention and maintain strict control of hypertension with blood pressure goal below 130/90, diabetes with hemoglobin A1c goal below 6.5% and lipids with LDL cholesterol goal below 70 mg/dL.  Check MRI scan of the brain, MRA of the brain and neck, lipid profile, hemoglobin A1c, CBC and comprehensive metabolic panel labs. Greater than 50% time during this 45-minute consultation visit was spent on counseling and coordination of care about his spastic left hemiplegia with increased weakness and discussion about differential diagnosis and evaluation and treatment plan and answering questions. Followup in the future with me in 3 months or  call earlier if necessary. Gregory Contras, MD  [facility] [address] [place], [place] [address]  Phone [phone] Fax [phone] Note: This document was prepared with digital dictation and possible smart phrase technology. Any transcriptional errors that result from this process are unintentional.

## 2019-12-02 NOTE — Patient Instructions (Signed)
I had a long d/w patient about his remote stroke, spastic left hemiplegia with recent increase left leg weakness and differential diagnosis for that, risk for recurrent stroke/TIAs, personally independently reviewed imaging studies and stroke evaluation results and answered questions.Continue Aggrenox twice daily for secondary stroke prevention and maintain strict control of hypertension with blood pressure goal below 130/90, diabetes with hemoglobin A1c goal below 6.5% and lipids with LDL cholesterol goal below 70 mg/dL.  Check MRI scan of the brain, MRA of the brain and neck, lipid profile, hemoglobin A1c, CBC and comprehensive metabolic panel labs.  Followup in the future with me in 3 months or call earlier if necessary.

## 2019-12-03 LAB — CBC
Hematocrit: 45 % (ref 37.5–51.0)
Hemoglobin: 14.9 g/dL (ref 13.0–17.7)
MCH: 29.9 pg (ref 26.6–33.0)
MCHC: 33.1 g/dL (ref 31.5–35.7)
MCV: 90 fL (ref 79–97)
Platelets: 163 10*3/uL (ref 150–450)
RBC: 4.98 x10E6/uL (ref 4.14–5.80)
RDW: 13.2 % (ref 11.6–15.4)
WBC: 4.8 10*3/uL (ref 3.4–10.8)

## 2019-12-03 LAB — COMPREHENSIVE METABOLIC PANEL
ALT: 16 IU/L (ref 0–44)
AST: 22 IU/L (ref 0–40)
Albumin/Globulin Ratio: 1.5 (ref 1.2–2.2)
Albumin: 3.9 g/dL (ref 3.7–4.7)
Alkaline Phosphatase: 102 IU/L (ref 48–121)
BUN/Creatinine Ratio: 20 (ref 10–24)
BUN: 16 mg/dL (ref 8–27)
Bilirubin Total: 0.4 mg/dL (ref 0.0–1.2)
CO2: 24 mmol/L (ref 20–29)
Calcium: 9.4 mg/dL (ref 8.6–10.2)
Chloride: 105 mmol/L (ref 96–106)
Creatinine, Ser: 0.8 mg/dL (ref 0.76–1.27)
GFR calc Af Amer: 100 mL/min/{1.73_m2} (ref >59)
GFR calc non Af Amer: 87 mL/min/{1.73_m2} (ref >59)
Globulin, Total: 2.6 g/dL (ref 1.5–4.5)
Glucose: 84 mg/dL (ref 65–99)
Potassium: 4.2 mmol/L (ref 3.5–5.2)
Sodium: 141 mmol/L (ref 134–144)
Total Protein: 6.5 g/dL (ref 6.0–8.5)

## 2019-12-03 LAB — HEMOGLOBIN A1C
Est. average glucose Bld gHb Est-mCnc: 117 mg/dL
Hgb A1c MFr Bld: 5.7 % — ABNORMAL HIGH (ref 4.8–5.6)

## 2019-12-03 LAB — LIPID PANEL
Chol/HDL Ratio: 4.2 ratio (ref 0.0–5.0)
Cholesterol, Total: 172 mg/dL (ref 100–199)
HDL: 41 mg/dL (ref >39)
LDL Chol Calc (NIH): 107 mg/dL — ABNORMAL HIGH (ref 0–99)
Triglycerides: 136 mg/dL (ref 0–149)
VLDL Cholesterol Cal: 24 mg/dL (ref 5–40)

## 2019-12-07 ENCOUNTER — Other Ambulatory Visit: Payer: Self-pay | Admitting: Neurology

## 2019-12-07 NOTE — Progress Notes (Signed)
Kindly inform the patient that his lab work for metabolic panel, CBC blood count and screening for diabetes were all satisfactory however his bad cholesterol is still elevated and I recommend he increase the dose of simvastatin to 40 mg daily.

## 2019-12-09 ENCOUNTER — Encounter: Payer: Self-pay | Admitting: Neurology

## 2019-12-09 ENCOUNTER — Telehealth: Payer: Self-pay | Admitting: Neurology

## 2019-12-09 NOTE — Telephone Encounter (Signed)
Called the patient to discuss lab results. There was no answer. LVM for the patient to call back. Didn't see a DPR on file

## 2019-12-09 NOTE — Telephone Encounter (Signed)
-----   Message from Garvin Fila, MD sent at 12/07/2019  3:28 PM EDT ----- [facility] inform the patient that his lab work for metabolic panel, CBC blood count and screening for diabetes were all satisfactory however his bad cholesterol is still elevated and I recommend he increase the dose of simvastatin to 40 mg daily.

## 2019-12-10 NOTE — Telephone Encounter (Signed)
Attempted to call pt, LVM for call back  °

## 2019-12-16 ENCOUNTER — Encounter: Payer: Self-pay | Admitting: Neurology

## 2020-01-06 ENCOUNTER — Ambulatory Visit
Admission: RE | Admit: 2020-01-06 | Discharge: 2020-01-06 | Disposition: A | Discharge status: Home / Self Care | Payer: Medicare Other | Source: Ambulatory Visit | Attending: Neurology | Admitting: Neurology

## 2020-01-06 ENCOUNTER — Other Ambulatory Visit: Payer: Self-pay

## 2020-01-06 DIAGNOSIS — I69354 Hemiplegia and hemiparesis following cerebral infarction affecting left non-dominant side: Secondary | ICD-10-CM | POA: Diagnosis not present

## 2020-01-06 MED ORDER — GADOBENATE DIMEGLUMINE 529 MG/ML IV SOLN
15.0000 mL | Freq: Once | INTRAVENOUS | Status: AC | PRN
  Administered 2020-01-06: 15 mL via INTRAVENOUS

## 2020-01-09 NOTE — Progress Notes (Signed)
Kindly inform the patient that MRI scan of the brain shows evidence of a few small old strokes in the deep portions of the brain as well as changes of hardening of the arteries appropriate to his age.  No new or unexpected findings and no significant change compared with previous MRI

## 2020-01-09 NOTE — Progress Notes (Signed)
Kindly inform the patient that MR angiogram of the brain shows no significant blockage of the major blood vessels inside the brain or aneurysms.

## 2020-01-09 NOTE — Progress Notes (Signed)
Kindly inform the patient that MR angiogram of the neck shows no significant stenosis of both internal carotid arteries.  There is mild narrowing of the right external carotid artery but this does not increase stroke risk

## 2020-01-12 ENCOUNTER — Telehealth: Payer: Self-pay | Admitting: *Deleted

## 2020-01-12 NOTE — Telephone Encounter (Signed)
LVM for  Sister: patient's MRI scan of the brain shows evidence of a few small old strokes in the deep portions of the brain as well as changes of hardening of the arteries appropriate to his age. No new or unexpected findings and no significant change compared with previous MRI. Left # for questions.

## 2020-01-12 NOTE — Telephone Encounter (Signed)
Called sister, [name] on [place] and informed her that patient's MR angiogram of the neck shows no significant stenosis of both internal carotid arteries. There is mild narrowing of the right external carotid artery but this does not increase stroke risk. Informed her his MR angiogram of the brain shows no significant blockage of the major blood vessels inside the brain or aneurysms.  She verbalized understanding, appreciation. She will inform her brother.

## 2020-03-08 ENCOUNTER — Ambulatory Visit: Payer: Medicare Other | Admitting: Neurology

## 2020-05-12 ENCOUNTER — Ambulatory Visit: Payer: Medicare Other | Admitting: Adult Health

## 2020-05-20 ENCOUNTER — Ambulatory Visit: Payer: Medicare Other | Admitting: Neurology

## 2020-07-14 ENCOUNTER — Ambulatory Visit (INDEPENDENT_AMBULATORY_CARE_PROVIDER_SITE_OTHER): Payer: Medicare Other | Admitting: Neurology

## 2020-07-14 ENCOUNTER — Encounter: Payer: Self-pay | Admitting: Neurology

## 2020-07-14 VITALS — BP 154/78 | HR 71 | Ht 69.0 in | Wt 170.0 lb

## 2020-07-14 DIAGNOSIS — I69354 Hemiplegia and hemiparesis following cerebral infarction affecting left non-dominant side: Secondary | ICD-10-CM

## 2020-07-14 NOTE — Patient Instructions (Signed)
I had a long discussion with the patient with regards to his remote stroke and increased left leg weakness and discussed the results of MRI scans MRAs and lab results.  I recommend we check follow-up lipid profile today and continue Aggrenox for stroke prevention with aggressive risk factor modification with strict control of hypertension with blood pressure goal below 130/90, lipids with LDL cholesterol goal below 70 mg percent and diabetes with hemoglobin A1c goal below 6.5%.  He will return for follow-up in the future in a year or call earlier if necessary.

## 2020-07-14 NOTE — Progress Notes (Signed)
[facility] [address] [place]. [place] [address] [phone]       OFFICE FOLLOW UP VISIT NOTE  [name] Date of Birth:  [birth year] Medical Record Number:  [id number]   Referring MD: Virgel Bouquet Reason for Referral: Increased left-sided weakness  MPN:[id number] visit 12/02/2019 Gregory Kerr is a 80 year old pleasant Caucasian male seen today for initial office consultation visit for stroke and increased weakness. History is obtained from the patient and review of referral notes and electronic medical records. No relevant imaging films are available in PACS. He has past medical history of stroke with spastic left hemiplegia, hypertension, hyperlipidemia, benign prostatic hypertrophy, depression and gastroesophageal reflux disease. Patient states he developed right subcortical infarct in 2007 and has had significant residual spastic left hemiplegia. He was in fact seen by Dr. Mary Sella and received Botox injections which did not show significant benefit and hence he was lost to follow-up since January 2017. He has been living in assisted living section of [facility] for the last 13 years. He is at baseline wheelchair-bound and unable to ambulate. However he has been able to transfer himself out from a wheelchair to his bed and he has noticed that for the last few months he has had increased weakness in his left leg. He is finding it increasing difficulty with activities like standing up, transferring out of bed, pulling his pants up. He feels his left leg may be weaker. He denies any increase slurred speech, double vision, loss of vision, upper extremity weakness or headaches. He has had no recent brain imaging studies or any work-up done for this weakness. He denies any recurrent stroke or TIA symptoms in the last 13 years. He had a transient episode of blurred vision which lasted few days in the past but did not seek medical help. He has had surgery in the right eye for retinal  detachment last year which went well. I was unable to review records from his previous stroke in 2007 or imaging studies today. Update 07/14/2020 : He returns for follow-up after last visit 6 months ago.  Patient continues to have left leg weakness which has not improved.  He in fact has even trouble transferring out of the wheelchair and and pulling up his pants.  He continues to stay at [facility] and needs help for these activities of daily living.  He is finished physical Occupational Therapy and is no longer getting them.  He had lab work done at last visit which showed elevated LDL of 107 mg percent and I increase his simvastatin dose to 40 mg daily but is not yet had any follow-up labs.  His hemoglobin A1c at that visit was 5.7.  MRI scan of the brain done on 01/08/2020 showed old lacunar infarcts in the right thalamus, left pons and right medulla and changes of small vessel disease.  No acute abnormality was noted.  MRI of the brain and neck both did not show significant large vessel extracranial intracranial stenosis.  He has no new complaints.  He remains on Aggrenox which is tolerating well without side effects.  His blood pressure is usually under good control today slightly elevated 154/78. ROS:   14 system review of systems is positive for left leg weakness, gait difficulty, spasticity and all other systems negative  PMH:  Past Medical History:  Diagnosis Date  . GERD (gastroesophageal reflux disease)   . Hernia    "large one; on my left stomach" (09/22/2014)  .  Hiatal hernia 10/24/2012  . HTN (hypertension) 10/24/2012  . Hyperlipidemia   . Hypertension   . 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 Osteoporosis, unspecified 10/24/2012  . 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 Pneumonia    "once or twice" (09/22/2014)  . Retinal detachment    left eye  . Stroke [facility]) 2007   "left side doesn't work now" (09/22/2014)    Social History:  Social History   Socioeconomic History  . Marital  status: Single    Spouse name: Not on file  . Number of children: Not on file  . Years of education: Not on file  . Highest education level: Not on file  Occupational History  . Not on file  Tobacco Use  . Smoking status: Former Smoker    Packs/day: 1.00    Types: Cigarettes    Quit date: 12/20/2005    Years since quitting: 14.5  . Smokeless tobacco: Never Used  Substance and Sexual Activity  . Alcohol use: Not Currently    Comment: 09/22/2014 "stopped in 2007; drank occasionally when I did drink"  . Drug use: No  . Sexual activity: Never  Other Topics Concern  . Not on file  Social History Narrative   Full time resident at [facility]   Right handed   Drinks caffeine occassionally   Social Determinants of Health   Financial Resource Strain: Not on file  Food Insecurity: Not on file  Transportation Needs: Not on file  Physical Activity: Not on file  Stress: Not on file  Social Connections: Not on file  Intimate Partner Violence: Not on file    Medications:   Current Outpatient Medications on File Prior to Visit  Medication Sig Dispense Refill  . acetaminophen (TYLENOL) 325 MG tablet Take 650 mg by mouth every 4 (four) hours as needed for mild pain or headache. Not to exceed more that 3000 mg in 24 hrs    . baclofen (LIORESAL) 10 MG tablet Take 10 mg by mouth 4 (four) times daily.     . calcium-vitamin D (OSCAL WITH D) 500-200 MG-UNIT tablet Take 1 tablet by mouth 2 (two) times daily.    Marland Kitchen denosumab (PROLIA) 60 MG/ML SOLN injection Inject 60 mg into the skin every 6 (six) months. Administer in upper arm, thigh, or abdomen  ON THE 13TH April and Oct.    . Dextromethorphan-quiNIDine (NUEDEXTA) 20-10 MG capsule Take 1 capsule by mouth daily. For Pseudobulbar Affect (PBA)    . dipyridamole-aspirin (AGGRENOX) 200-25 MG per 12 hr capsule Take 1 capsule by mouth 2 (two) times daily. for CVA    . gabapentin (NEURONTIN) 100 MG capsule Take 100 mg by mouth 3 (three) times daily.     Marland Kitchen ketorolac (ACULAR) 0.4 % SOLN Place 1 drop into both eyes daily.    Marland Kitchen lisinopril (ZESTRIL) 10 MG tablet Take 10 mg by mouth daily.    Marland Kitchen loperamide (IMODIUM) 2 MG capsule Take 2-4 mg by mouth See admin instructions. Take 4mg  after the initial loose stool then take 2mg  after each loose stool. Do not exceed 16mg  in a 24 hour period.    Marland Kitchen loratadine (CLARITIN) 10 MG tablet Take 10 mg by mouth daily.    . mirabegron ER (MYRBETRIQ) 50 MG TB24 tablet Take 50 mg by mouth daily.    . nitroGLYCERIN (NITROSTAT) 0.4 MG SL tablet Place 0.4 mg under the tongue every 5 (five) minutes as needed for chest pain.    . pantoprazole (PROTONIX) 40 MG tablet Take  40 mg by mouth daily.     . polyethylene glycol (MIRALAX / GLYCOLAX) packet Take 17 g by mouth daily as needed (constipation).     . prednisoLONE acetate (PRED FORTE) 1 % ophthalmic suspension Place 1 drop into both eyes daily.    . Prenatal Vit-Fe Fumarate-FA (PREPLUS) 27-1 MG TABS Take 1 tablet by mouth daily.     . sertraline (ZOLOFT) 50 MG tablet Take 50 mg by mouth daily.    . simvastatin (ZOCOR) 20 MG tablet Take 40 mg by mouth at bedtime.    . tamsulosin (FLOMAX) 0.4 MG CAPS capsule Take 0.4 mg by mouth at bedtime.    . Cholecalciferol 2000 units TABS Take 2,000 Units by mouth daily.    . metoprolol tartrate (LOPRESSOR) 25 MG tablet Take 0.5 tablets (12.5 mg total) by mouth 2 (two) times daily.    . solifenacin (VESICARE) 5 MG tablet Take 5 mg by mouth daily.     No current facility-administered medications on file prior to visit.    Allergies:   Allergies  Allergen Reactions  . Penicillins Rash    ++++Patient in nursing home. Unable to clarify details/severity of the reaction.++++    Physical Exam General: Pleasant elderly Caucasian male seated, in no evident distress Head: head normocephalic and atraumatic.   Neck: supple with no carotid or supraclavicular bruits Cardiovascular: regular rate and rhythm, no murmurs Musculoskeletal: no  deformity Skin:  no rash/petichiae Vascular:  Normal pulses all extremities  Neurologic Exam Mental Status: Awake and fully alert. Oriented to place and time. Recent and remote memory intact. Attention span, concentration and fund of knowledge appropriate. Mood and affect appropriate.  Cranial Nerves: Fundoscopic exam not done pupils equal, briskly reactive to light. Extraocular movements full without nystagmus. Visual fields full to confrontation. Hearing intact. Facial sensation intact. Mild left lower facial weakness., tongue, palate moves normally and symmetrically.  Motor: Spastic left hemiplegia with 2/5 strength proximally at the shoulders and hips with significant weakness of the left elbow as well as left knee and ankle. Left foot drop with ankle and knee strength 0/5. Able to bend fingers in the left hand but weakness of the left wrist and elbow. Tone is increased significantly on the left compared to the right. Normal strength on the right side. Left foot drop. Wearing a left ankle brace. Fixed flexion contracture of the left elbow and wrist but not of the fingers Sensory.: intact to touch , pinprick , position and vibratory sensation.  Coordination: Impaired left finger-to-nose and needle coordination. Normal on the right. Gait and Station: Deferred as patient is wheelchair-bound and unable to walk. Reflexes: 2+ and asymmetric and brisker on the left. Toes downgoing.   NIHSS  7 Modified Rankin  4   ASSESSMENT: 80 year old Caucasian male with spastic left hemiplegia following right brain subcortical infarct in 2007 with recent worsening of his left lower extremity strength of unclear etiology possibly small infarct not visualized on MRI as it was done several months later.. Vascular risk factors of hypertension hyperlipidemia , prior stroke and age     PLAN: I had a long discussion with the patient with regards to his remote stroke and increased left leg weakness and discussed the  results of MRI scans MRAs and lab results.  I recommend we check follow-up lipid profile today and continue Aggrenox for stroke prevention with aggressive risk factor modification with strict control of hypertension with blood pressure goal below 130/90, lipids with LDL cholesterol goal below 70 mg  percent and diabetes with hemoglobin A1c goal below 6.5%.  He will return for follow-up in the future in a year or call earlier if necessary.Greater than 50% time during this 25-minute  visit was spent on counseling and coordination of care about his spastic left hemiplegia with increased weakness and discussion about differential diagnosis and evaluation and treatment plan and answering questions.  Antony Contras, MD  [facility] [address] [place], [place] [address]  Phone [phone] Fax [phone] Note: This document was prepared with digital dictation and possible smart phrase technology. Any transcriptional errors that result from this process are unintentional.

## 2020-07-15 LAB — LIPID PANEL
Chol/HDL Ratio: 4.8 ratio (ref 0.0–5.0)
Cholesterol, Total: 192 mg/dL (ref 100–199)
HDL: 40 mg/dL (ref >39)
LDL Chol Calc (NIH): 119 mg/dL — ABNORMAL HIGH (ref 0–99)
Triglycerides: 189 mg/dL — ABNORMAL HIGH (ref 0–149)
VLDL Cholesterol Cal: 33 mg/dL (ref 5–40)

## 2020-07-20 NOTE — Progress Notes (Signed)
Kindly inform the patient that the bad cholesterol was above goal and I recommend he increase the dose of simvastatin or Zocor to 80 mg daily

## 2020-07-22 ENCOUNTER — Telehealth: Payer: Self-pay | Admitting: *Deleted

## 2020-07-22 NOTE — Telephone Encounter (Signed)
LVM to  inform the patient that the bad cholesterol was above goal. Dr Leonie Man recommend he increase the dose of simvastatin or Zocor to 80 mg daily. Left # for questions.

## 2020-11-18 ENCOUNTER — Observation Stay (HOSPITAL_COMMUNITY): Payer: Medicare Other

## 2020-11-18 ENCOUNTER — Encounter (HOSPITAL_COMMUNITY): Payer: Self-pay

## 2020-11-18 ENCOUNTER — Emergency Department (HOSPITAL_COMMUNITY): Payer: Medicare Other

## 2020-11-18 ENCOUNTER — Inpatient Hospital Stay (HOSPITAL_COMMUNITY)
Admission: EM | Admit: 2020-11-18 | Discharge: 2020-11-21 | DRG: 065 | Disposition: A | Discharge status: Skilled Nursing Facility | Payer: Medicare Other | Attending: Internal Medicine | Admitting: Internal Medicine

## 2020-11-18 DIAGNOSIS — Z993 Dependence on wheelchair: Secondary | ICD-10-CM

## 2020-11-18 DIAGNOSIS — I639 Cerebral infarction, unspecified: Secondary | ICD-10-CM

## 2020-11-18 DIAGNOSIS — Z7401 Bed confinement status: Secondary | ICD-10-CM

## 2020-11-18 DIAGNOSIS — I6381 Other cerebral infarction due to occlusion or stenosis of small artery: Principal | ICD-10-CM | POA: Diagnosis present

## 2020-11-18 DIAGNOSIS — Z8249 Family history of ischemic heart disease and other diseases of the circulatory system: Secondary | ICD-10-CM

## 2020-11-18 DIAGNOSIS — R531 Weakness: Secondary | ICD-10-CM | POA: Diagnosis not present

## 2020-11-18 DIAGNOSIS — I6930 Unspecified sequelae of cerebral infarction: Secondary | ICD-10-CM

## 2020-11-18 DIAGNOSIS — I10 Essential (primary) hypertension: Secondary | ICD-10-CM | POA: Diagnosis present

## 2020-11-18 DIAGNOSIS — I69354 Hemiplegia and hemiparesis following cerebral infarction affecting left non-dominant side: Secondary | ICD-10-CM

## 2020-11-18 DIAGNOSIS — G8191 Hemiplegia, unspecified affecting right dominant side: Secondary | ICD-10-CM | POA: Diagnosis present

## 2020-11-18 DIAGNOSIS — Z87891 Personal history of nicotine dependence: Secondary | ICD-10-CM

## 2020-11-18 DIAGNOSIS — R471 Dysarthria and anarthria: Secondary | ICD-10-CM | POA: Diagnosis present

## 2020-11-18 DIAGNOSIS — N400 Enlarged prostate without lower urinary tract symptoms: Secondary | ICD-10-CM | POA: Diagnosis present

## 2020-11-18 DIAGNOSIS — R29810 Facial weakness: Secondary | ICD-10-CM | POA: Diagnosis present

## 2020-11-18 DIAGNOSIS — I872 Venous insufficiency (chronic) (peripheral): Secondary | ICD-10-CM | POA: Diagnosis present

## 2020-11-18 DIAGNOSIS — E785 Hyperlipidemia, unspecified: Secondary | ICD-10-CM | POA: Diagnosis present

## 2020-11-18 DIAGNOSIS — K219 Gastro-esophageal reflux disease without esophagitis: Secondary | ICD-10-CM | POA: Diagnosis present

## 2020-11-18 DIAGNOSIS — I6502 Occlusion and stenosis of left vertebral artery: Secondary | ICD-10-CM | POA: Diagnosis present

## 2020-11-18 DIAGNOSIS — I2510 Atherosclerotic heart disease of native coronary artery without angina pectoris: Secondary | ICD-10-CM | POA: Diagnosis present

## 2020-11-18 DIAGNOSIS — M21372 Foot drop, left foot: Secondary | ICD-10-CM | POA: Diagnosis present

## 2020-11-18 DIAGNOSIS — N3281 Overactive bladder: Secondary | ICD-10-CM | POA: Diagnosis present

## 2020-11-18 DIAGNOSIS — Z20822 Contact with and (suspected) exposure to covid-19: Secondary | ICD-10-CM | POA: Diagnosis present

## 2020-11-18 DIAGNOSIS — Z880 Allergy status to penicillin: Secondary | ICD-10-CM

## 2020-11-18 DIAGNOSIS — B965 Pseudomonas (aeruginosa) (mallei) (pseudomallei) as the cause of diseases classified elsewhere: Secondary | ICD-10-CM | POA: Diagnosis present

## 2020-11-18 DIAGNOSIS — Z9049 Acquired absence of other specified parts of digestive tract: Secondary | ICD-10-CM

## 2020-11-18 DIAGNOSIS — Z800 Family history of malignant neoplasm of digestive organs: Secondary | ICD-10-CM

## 2020-11-18 DIAGNOSIS — K2289 Other specified disease of esophagus: Secondary | ICD-10-CM | POA: Diagnosis present

## 2020-11-18 DIAGNOSIS — F32A Depression, unspecified: Secondary | ICD-10-CM | POA: Diagnosis present

## 2020-11-18 DIAGNOSIS — I6523 Occlusion and stenosis of bilateral carotid arteries: Secondary | ICD-10-CM | POA: Diagnosis present

## 2020-11-18 DIAGNOSIS — E119 Type 2 diabetes mellitus without complications: Secondary | ICD-10-CM | POA: Diagnosis present

## 2020-11-18 DIAGNOSIS — Z79899 Other long term (current) drug therapy: Secondary | ICD-10-CM

## 2020-11-18 DIAGNOSIS — M810 Age-related osteoporosis without current pathological fracture: Secondary | ICD-10-CM | POA: Diagnosis present

## 2020-11-18 DIAGNOSIS — N390 Urinary tract infection, site not specified: Secondary | ICD-10-CM | POA: Diagnosis present

## 2020-11-18 DIAGNOSIS — I63312 Cerebral infarction due to thrombosis of left middle cerebral artery: Secondary | ICD-10-CM

## 2020-11-18 LAB — RAPID URINE DRUG SCREEN, HOSP PERFORMED
Amphetamines: NOT DETECTED
Barbiturates: NOT DETECTED
Benzodiazepines: NOT DETECTED
Cocaine: NOT DETECTED
Opiates: NOT DETECTED
Tetrahydrocannabinol: NOT DETECTED

## 2020-11-18 LAB — DIFFERENTIAL
Abs Immature Granulocytes: 0.03 10*3/uL (ref 0.00–0.07)
Basophils Absolute: 0.1 10*3/uL (ref 0.0–0.1)
Basophils Relative: 1 %
Eosinophils Absolute: 0.1 10*3/uL (ref 0.0–0.5)
Eosinophils Relative: 1 %
Immature Granulocytes: 0 %
Lymphocytes Relative: 10 %
Lymphs Abs: 0.9 10*3/uL (ref 0.7–4.0)
Monocytes Absolute: 0.6 10*3/uL (ref 0.1–1.0)
Monocytes Relative: 6 %
Neutro Abs: 7.5 10*3/uL (ref 1.7–7.7)
Neutrophils Relative %: 82 %

## 2020-11-18 LAB — RESP PANEL BY RT-PCR (FLU A&B, COVID) ARPGX2
Influenza A by PCR: NEGATIVE
Influenza B by PCR: NEGATIVE
SARS Coronavirus 2 by RT PCR: NEGATIVE

## 2020-11-18 LAB — LIPID PANEL
Cholesterol: 164 mg/dL (ref 0–200)
HDL: 42 mg/dL (ref >40)
LDL Cholesterol: 94 mg/dL (ref 0–99)
Total CHOL/HDL Ratio: 3.9 RATIO
Triglycerides: 138 mg/dL (ref <150)
VLDL: 28 mg/dL (ref 0–40)

## 2020-11-18 LAB — COMPREHENSIVE METABOLIC PANEL
ALT: 18 U/L (ref 0–44)
AST: 23 U/L (ref 15–41)
Albumin: 3.2 g/dL — ABNORMAL LOW (ref 3.5–5.0)
Alkaline Phosphatase: 81 U/L (ref 38–126)
Anion gap: 10 (ref 5–15)
BUN: 20 mg/dL (ref 8–23)
CO2: 24 mmol/L (ref 22–32)
Calcium: 9.2 mg/dL (ref 8.9–10.3)
Chloride: 106 mmol/L (ref 98–111)
Creatinine, Ser: 1.03 mg/dL (ref 0.61–1.24)
GFR, Estimated: >60 mL/min (ref >60)
Glucose, Bld: 108 mg/dL — ABNORMAL HIGH (ref 70–99)
Potassium: 3.8 mmol/L (ref 3.5–5.1)
Sodium: 140 mmol/L (ref 135–145)
Total Bilirubin: 0.4 mg/dL (ref 0.3–1.2)
Total Protein: 6.4 g/dL — ABNORMAL LOW (ref 6.5–8.1)

## 2020-11-18 LAB — URINALYSIS, ROUTINE W REFLEX MICROSCOPIC
Bilirubin Urine: NEGATIVE
Glucose, UA: NEGATIVE mg/dL
Hgb urine dipstick: NEGATIVE
Ketones, ur: NEGATIVE mg/dL
Nitrite: POSITIVE — AB
Protein, ur: NEGATIVE mg/dL
Specific Gravity, Urine: 1.018 (ref 1.005–1.030)
pH: 6 (ref 5.0–8.0)

## 2020-11-18 LAB — CBC
HCT: 43.2 % (ref 39.0–52.0)
Hemoglobin: 14.5 g/dL (ref 13.0–17.0)
MCH: 29.8 pg (ref 26.0–34.0)
MCHC: 33.6 g/dL (ref 30.0–36.0)
MCV: 88.9 fL (ref 80.0–100.0)
Platelets: 160 10*3/uL (ref 150–400)
RBC: 4.86 MIL/uL (ref 4.22–5.81)
RDW: 13.4 % (ref 11.5–15.5)
WBC: 9.1 10*3/uL (ref 4.0–10.5)
nRBC: 0 % (ref 0.0–0.2)

## 2020-11-18 LAB — PROTIME-INR
INR: 1 (ref 0.8–1.2)
Prothrombin Time: 13.5 seconds (ref 11.4–15.2)

## 2020-11-18 LAB — ETHANOL: Alcohol, Ethyl (B): <10 mg/dL (ref <10)

## 2020-11-18 LAB — APTT: aPTT: 27 seconds (ref 24–36)

## 2020-11-18 MED ORDER — SERTRALINE HCL 50 MG PO TABS
50.0000 mg | ORAL_TABLET | Freq: Every day | ORAL | Status: DC
  Administered 2020-11-19 – 2020-11-21 (×3): 50 mg via ORAL
  Filled 2020-11-18 (×4): qty 1

## 2020-11-18 MED ORDER — POLYETHYLENE GLYCOL 3350 17 G PO PACK
17.0000 g | PACK | Freq: Every day | ORAL | Status: DC
  Filled 2020-11-18: qty 1

## 2020-11-18 MED ORDER — ACETAMINOPHEN 650 MG RE SUPP: 650.0000 mg | RECTAL | Status: DC | PRN

## 2020-11-18 MED ORDER — ASPIRIN-DIPYRIDAMOLE ER 25-200 MG PO CP12
1.0000 | ORAL_CAPSULE | Freq: Two times a day (BID) | ORAL | Status: DC
  Administered 2020-11-19: 1 via ORAL
  Filled 2020-11-18 (×2): qty 1

## 2020-11-18 MED ORDER — ASPIRIN 325 MG PO TABS
325.0000 mg | ORAL_TABLET | Freq: Once | ORAL | Status: AC
  Administered 2020-11-18: 325 mg via ORAL
  Filled 2020-11-18: qty 1

## 2020-11-18 MED ORDER — ACETAMINOPHEN 325 MG PO TABS
650.0000 mg | ORAL_TABLET | ORAL | Status: DC | PRN
Start: 2020-11-18 — End: 2020-11-22
  Administered 2020-11-19 – 2020-11-21 (×2): 650 mg via ORAL
  Filled 2020-11-18 (×2): qty 2

## 2020-11-18 MED ORDER — MIRABEGRON ER 25 MG PO TB24
50.0000 mg | ORAL_TABLET | Freq: Every day | ORAL | Status: DC
  Administered 2020-11-19 – 2020-11-21 (×3): 50 mg via ORAL
  Filled 2020-11-18: qty 2
  Filled 2020-11-18: qty 1
  Filled 2020-11-18 (×2): qty 2

## 2020-11-18 MED ORDER — SODIUM CHLORIDE 0.9 % IV BOLUS
500.0000 mL | Freq: Once | INTRAVENOUS | Status: AC
  Administered 2020-11-18: 500 mL via INTRAVENOUS

## 2020-11-18 MED ORDER — ATORVASTATIN CALCIUM 40 MG PO TABS
40.0000 mg | ORAL_TABLET | Freq: Every day | ORAL | Status: DC
  Administered 2020-11-19 – 2020-11-21 (×3): 40 mg via ORAL
  Filled 2020-11-18 (×3): qty 1

## 2020-11-18 MED ORDER — PANTOPRAZOLE SODIUM 40 MG PO TBEC
40.0000 mg | DELAYED_RELEASE_TABLET | Freq: Every day | ORAL | Status: DC
  Administered 2020-11-19 – 2020-11-21 (×3): 40 mg via ORAL
  Filled 2020-11-18 (×4): qty 1

## 2020-11-18 MED ORDER — TAMSULOSIN HCL 0.4 MG PO CAPS
0.4000 mg | ORAL_CAPSULE | Freq: Every day | ORAL | Status: DC
  Administered 2020-11-18 – 2020-11-21 (×4): 0.4 mg via ORAL
  Filled 2020-11-18 (×4): qty 1

## 2020-11-18 MED ORDER — ACETAMINOPHEN 160 MG/5ML PO SOLN: 650.0000 mg | ORAL | Status: DC | PRN

## 2020-11-18 MED ORDER — ENOXAPARIN SODIUM 40 MG/0.4ML IJ SOSY
40.0000 mg | PREFILLED_SYRINGE | INTRAMUSCULAR | Status: DC
  Administered 2020-11-18 – 2020-11-21 (×4): 40 mg via SUBCUTANEOUS
  Filled 2020-11-18 (×4): qty 0.4

## 2020-11-18 MED ORDER — STROKE: EARLY STAGES OF RECOVERY BOOK
Freq: Once | Status: AC
  Filled 2020-11-18: qty 1

## 2020-11-18 MED ORDER — GABAPENTIN 100 MG PO CAPS
100.0000 mg | ORAL_CAPSULE | Freq: Three times a day (TID) | ORAL | Status: DC
  Administered 2020-11-18 – 2020-11-21 (×10): 100 mg via ORAL
  Filled 2020-11-18 (×10): qty 1

## 2020-11-18 MED ORDER — DEXTROMETHORPHAN-QUINIDINE 20-10 MG PO CAPS
1.0000 | ORAL_CAPSULE | Freq: Every day | ORAL | Status: DC
  Administered 2020-11-19 – 2020-11-21 (×3): 1 via ORAL
  Filled 2020-11-18 (×3): qty 1

## 2020-11-18 MED ORDER — TAMSULOSIN HCL 0.4 MG PO CAPS: 0.4000 mg | ORAL_CAPSULE | Freq: Every day | ORAL | Status: DC

## 2020-11-18 MED ORDER — BACLOFEN 10 MG PO TABS
10.0000 mg | ORAL_TABLET | Freq: Four times a day (QID) | ORAL | Status: DC
  Administered 2020-11-18 – 2020-11-21 (×12): 10 mg via ORAL
  Filled 2020-11-18 (×13): qty 1

## 2020-11-18 MED ORDER — PREDNISOLONE ACETATE 1 % OP SUSP: 1.0000 [drp] | Freq: Every day | OPHTHALMIC | Status: DC

## 2020-11-18 MED ORDER — DARIFENACIN HYDROBROMIDE ER 7.5 MG PO TB24
7.5000 mg | ORAL_TABLET | Freq: Every day | ORAL | Status: DC
  Administered 2020-11-19 – 2020-11-21 (×3): 7.5 mg via ORAL
  Filled 2020-11-18 (×3): qty 1

## 2020-11-18 MED ORDER — SODIUM CHLORIDE 0.9 % IV SOLN
100.0000 mL/h | INTRAVENOUS | Status: AC
  Administered 2020-11-18: 100 mL/h via INTRAVENOUS

## 2020-11-18 MED ORDER — CALCIUM CARBONATE-VITAMIN D 500-200 MG-UNIT PO TABS
1.0000 | ORAL_TABLET | Freq: Two times a day (BID) | ORAL | Status: DC
  Administered 2020-11-19 – 2020-11-21 (×6): 1 via ORAL
  Filled 2020-11-18 (×6): qty 1

## 2020-11-18 MED ORDER — SENNOSIDES-DOCUSATE SODIUM 8.6-50 MG PO TABS
1.0000 | ORAL_TABLET | Freq: Every day | ORAL | Status: DC
  Filled 2020-11-18: qty 1

## 2020-11-18 MED ORDER — KETOROLAC TROMETHAMINE 0.4 % OP SOLN: 1.0000 [drp] | Freq: Every day | OPHTHALMIC | Status: DC

## 2020-11-18 MED ORDER — CHOLECALCIFEROL 50 MCG (2000 UT) PO TABS: 2000.0000 [IU] | ORAL_TABLET | Freq: Every day | ORAL | Status: DC

## 2020-11-18 NOTE — H&P (Signed)
Date: 11/18/2020               Patient Name:  Gregory Kerr MRN: [id number]  DOB: August 08, 1940 Age / Sex: 80 y.o., male   PCP: Virgel Bouquet, MD         Medical Service: Internal Medicine Teaching Service         Attending Physician: Dr. Velna Ochs, MD    First Contact: Dr. Coy Saunas Pager: [phone]  Second Contact: Dr. Court Joy Pager: [phone]       After Hours (After 5p/  First Contact Pager: [phone]  weekends / holidays): Second Contact Pager: [phone]   Chief Complaint: Right sided weakness  History of Present Illness:  Mr.Gregory Kerr is a 80yo M w/ PMH of r subcortical infarct (2007) w/ residual spastic left hemiplegia, BPH, HTN, HLD, Depression, GERD presenting to [facility] with right sided weakness. He was in his usual state of health until 3pm yesterday when he had acute onset right sided weakness of both his arm and leg without obvious inciting event. He mentions at baseline he is able to mobilize in bed and transfer himself to chair independently but he was unable to do so and had to ask for help from staff at [facility]. He also mentions significant motor deficit resulting in being unable to feed himself as he could not lift his fork at mealtime. He was evaluated by a provider the next day who transferred him to [facility] right away. He denies any recent change or missed doses in medications, he denies any falls or trauma to the head. He denies any palpitations or leg pain prior to onset of symptoms.  On review of systems, he denies any chest pain, dyspnea, cough, nausea, vomiting, diarrhea. He does endorses frequent episodes of constipation although last bowel movement was yesterday. He denies any dysuria, urgency, frequency, fevers, chills. He denies any headache, blurry vision, numbness or tingling  Chart review shows prior hx of CVA in 2007 resulting in spastic left hemiplegia which has made him unable to perform IADLs, most ADLs independently. His stroke was suspected to be caused by  small vessel disease due to tobacco history, pre-diabetes and hyperlipidemia. He is followed by Dr.Sethi. Currently on simvastatin for HLD and Aggrenox for anti-platelet therapy.  Meds: Lisinopril 10mg  daily Loratidine 10mg  daily Mirabegron 50mg  daily Prednisolone both eyes daily Ketorolac 0.4% both eyes daily Calcium-vitamin D 500-200mg  daily Loperamide 2-4mg  daily Sertraline 50mg  daily Solifenacin 5mg  daily Tamsulosin 0.4mg  daily Simvastatin 40mg  daily Denosumab 60mg  q6 months Pantoprazole 40mg  daily Miralax PRN Tylenol PRN Nitroglycerin PRN Metoprolol tartrate 12.5mg  BID Gabapentin 100mg  TID Nuedexta 1 capsule daily Dipyridamole-aspirin 200-25mg  BID Baclofen 10mg  4 times daily  Allergies: Allergies as of 11/18/2020 - Review Complete 11/18/2020  Allergen Reaction Noted   Penicillins Rash 04/06/2012   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 Sister lives nearby although currently in rehab for foot fracture. Used to smoke 1 pack daily for 40 years until 2007. Used to drink for social events until 2007. Denies any illicit substance use  Review of Systems: A complete ROS was negative except as per HPI.   Physical Exam: Blood pressure (!) 161/81, pulse 86, temperature 98.1 F (36.7 C), temperature source Oral, resp. rate 19, height 5\' 9"  (1.753 m), weight 74.8 kg, SpO2 96 %.  Gen:  Well-developed, well nourished, NAD HEENT: NCAT head, hearing intact, EOMI, PERRL, bilateral peri-orbital pallor Neck: supple, ROM intact, CV: RRR, S1, S2 normal, No rubs, no murmurs, no gallops Pulm: CTAB, No rales, no wheezes, increased respiratory effort Abd: Soft, BS+, NTND, No rebound, no guarding Extm: chronic RUE contracture, LLE pitting edema up to mid thigh Skin: Dry, Warm, LLE venous stasis dermatitis, abrasion on lateral side of L leg Neurologic exam: Mental status: A&Ox3 Cranial Nerves:             II: PERRL             III, IV, VI: Extra-occular motions intact bilaterally             V, VII: Face symmetric, sensation intact in all 3 divisions               VIII: hearing normal to rubbing fingers bilaterally               IX, X: palate rises symmetrically             XI: Head turn and shoulder shrug normal bilaterally               XII: tongue midline    Motor: LUE atrophy with stable contracture, Strength 2/5 LUE, 1/5 LLE, 4/5 RUE, 3/5 RLE Sensory: Light touch intact and symmetric bilaterally  Coordination: There is mild dysmetria on finger-to-nose Psychiatric: Normal mood and affect  EKG: personally reviewed my interpretation is normal sinus, + motion artifact, no ischemic changes  CXR: N/A  Assessment & Plan by Problem: Active Problems:   CVA (cerebral vascular accident) ([place])  Mr.Gregory Kerr is a 80yo M w/ PMH of r subcortical infarct (2007) w/ residual spastic left hemiplegia, BPH, CAD, HTN, HLD, Depression, GERD presenting w/ R sided weakness suspicious for CVA vs TIA  R sided weakness 2/2 Cva vs TIA Hx of R subcortical infarct w/ residual L spastic hemiplegia Strength decreased on Right side. Prior hx of CVA due to small vessel disease. Risk factors include pre-diabetes, prior stroke, hyperlipidemia. Follows with Dr.Sethi. On Aggrenox, simvastatin at home - Appreciate neurology recs - Allow for permissive HTN in the setting of CVA vs TIA (systolic < 209 and diastolic  < 470)  - F/u MRI - ASA 325 mg - Atorvastatin 40mg  - Echocardiogram  - Neck imaging based on MRI findings - A1C, Lipid panel  - Telemetry - SLP / PT/OT - C/w home med: Dipyridamole-aspirin 200-25mg  BID, gabapentin, baclofen, Nuedexta  CAD HTN Admit bp 161/81. Currently chest pain free. Prior stress test in 2015 w/o significant findings. On nitroglycerin, metoprolol, Aggrenox, simvastatin - Hold bp meds in setting of permissive htn - C/w home statin, anti-platelet  GERD - C/w home pantoprazole  Depression - C/w home sertraline  BPH - C/w home tamsulosin, solifenacin, mirabegron  DVT prophx: lovenox Diet: NPO til S&S Bowel: Miralax Code: Full  Prior to Admission Living Arrangement: ALF, [facility] Anticipated Discharge Location: SNF Barriers to Discharge: Medical work-up  Dispo: Admit patient to Observation with expected length of stay less than 2 midnights.  Signed: Mosetta Anis, MD 11/18/2020, 1:46 PM Pager: [phone] After 5pm on weekdays and 1pm on weekends: On Call Pager: [phone]

## 2020-11-18 NOTE — Consult Note (Signed)
[place] Nurse Consult Note: Patient receiving care in [facility] [id number] Reason for Consult: LLE abrasion Wound type: Chronic LLE venous stasis dermatitis, per patient this is not new and has been this way since last CVA along with the foot drop on the same side.  Pressure Injury POA: NA Drainage (amount, consistency, odor) None Periwound: Red, dry, cracked skin Dressing procedure/placement/frequency: No treatment recommended at this time, however it would be appropriate to wash the LLE with soap and water, rinse and dry then apply Sween moisturizing lotion daily. (Pink top in clean supply)  Monitor the wound area(s) for worsening of condition such as: Signs/symptoms of infection, increase in size, development of or worsening of odor, development of pain, or increased pain at the affected locations.   Notify the medical team if any of these develop.  Thank you for the consult. [place] nurse will not follow at this time.   Please re-consult the [place] team if needed.  Cathlean Marseilles Tamala Julian, MSN, RN, [place], [place]le Pearl, [facility] Wound Treatment Associate Pager [phone]

## 2020-11-18 NOTE — ED Notes (Signed)
Patient was repositioned and pulled up on stretcher. Pillow was placed under left leg and right leg was crossed over left leg per patient's request. HOB in High fowlers position. SR up X 2 for safety.Call bell within reach.

## 2020-11-18 NOTE — ED Provider Notes (Signed)
[facility] EMERGENCY DEPARTMENT Provider Note   CSN: [id number] Arrival date & time: 11/18/20  1010     History Chief Complaint  Patient presents with   Weakness    Gregory Kerr is a 80 y.o. male.   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  Patient states yesterday he started having difficulty with weakness and decreased coordination on his right side.  Last time normal was yesterday around 1500.  Patient states normally he has to use his right side in order to transfer from the bed to a chair in a chair to the commode.  Since yesterday he has not been able to do that because of weakness on his right side.  It is involving his arm and leg and he also feels like his coordination is off.  He is also feeling numbness as well.  Denies any other recent injuries.  No fevers or chills.  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   Pneumonia    "once or twice" (09/22/2014)   Retinal detachment    left eye   Stroke [facility]) 2007   "left side doesn't work now" (09/22/2014)    Patient Active Problem List   Diagnosis Date Noted   Allergic rhinitis due to allergen 04/27/2016   Chest pain 02/27/2016   Thoracic ascending aortic aneurysm ([place]) 02/27/2016   Leukocytosis 02/27/2016   Major depression, chronic 02/16/2016   Neuropathic pain 02/16/2016   D (diarrhea) 12/29/2015   Coronary artery disease involving native coronary artery of native heart with angina pectoris ([place]) 11/12/2015   History of CVA with residual deficit 10/14/2015   Hyperlipidemia 07/16/2015   Hypertensive heart disease with congestive heart  failure ([place]) 12/10/2014   Angina pectoris ([place]) 12/10/2014   Chronic obstructive pulmonary disease ([place]) 12/10/2014   Irritable bowel syndrome with diarrhea 12/10/2014   Senile osteoporosis 12/10/2014   CVA (cerebral vascular accident) ([place]) 12/10/2014   Acute on chronic diastolic CHF (congestive heart failure) ([place]) 09/27/2014   Hypokalemia 09/23/2014   Pancreatitis, gallstone 09/22/2014   Chronic diastolic heart failure ([place]) 09/22/2014   Acute cholangitis 09/22/2014   Gallstone pancreatitis 09/22/2014   Hiatal hernia with GERD 08/31/2014   Vitamin D deficiency 08/03/2014   Hyperlipidemia LDL goal <100 08/03/2014   CVA, old, hemiparesis ([place]) 08/03/2014   Leg edema, left 08/03/2014   Gastroesophageal reflux disease without esophagitis 06/18/2014   Essential hypertension, benign 06/18/2014   Primary generalized hypertrophic osteoarthrosis 06/18/2014   Left spastic hemiplegia ([place]) 06/18/2014   UTI (urinary tract infection) 04/04/2014   Essential hypertension 04/03/2014   Chronic airway obstruction, not elsewhere classified 01/15/2014   Dermatophytosis of nail 12/18/2013   Unspecified essential hypertension 12/18/2013   Generalized osteoarthrosis, unspecified site 12/18/2013   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   GERD (gastroesophageal reflux disease) 08/11/2013   Disuse osteoporosis 08/11/2013   Chronic pain syndrome 01/30/2013   IBS (irritable bowel syndrome) 01/30/2013   Cerebral infarct ([place]) 10/24/2012   HTN (hypertension) 10/24/2012   Osteoporosis 10/24/2012   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   Other and unspecified hyperlipidemia 10/24/2012   Hiatal hernia 10/24/2012  Past Surgical History:  Procedure Laterality Date   APPENDECTOMY  05/27/2009   Ronnald Collum, MD   CATARACT EXTRACTION W/ INTRAOCULAR LENS  IMPLANT, BILATERAL Bilateral ?2014   CHOLECYSTECTOMY N/A 09/26/2014   Procedure: LAPAROSCOPIC CHOLECYSTECTOMY WITH INTRAOPERATIVE  CHOLANGIOGRAM;  Surgeon: Donnie Mesa, MD;  Location: [place];  Service: General;  Laterality: N/A;   COLONOSCOPY WITH PROPOFOL N/A 12/29/2015   Procedure: COLONOSCOPY WITH PROPOFOL;  Surgeon: Wilford Corner, MD;  Location: [facility] ENDOSCOPY;  Service: Endoscopy;  Laterality: N/A;   ERCP N/A 09/25/2014   Procedure: ENDOSCOPIC RETROGRADE CHOLANGIOPANCREATOGRAPHY (ERCP);  Surgeon: Clarene Essex, MD;  Location: [facility] ENDOSCOPY;  Service: Endoscopy;  Laterality: N/A;   PARS PLANA VITRECTOMY Left 01/30/2019   Procedure: PARS PLANA VITRECTOMY WITH 25 GAUGE;  Surgeon: Sherlynn Stalls, MD;  Location: [place];  Service: Ophthalmology;  Laterality: Left;   PARS PLANA VITRECTOMY Right 11/13/2019   Procedure: PARS PLANA VITRECTOMY WITH 25 GAUGE WITH REMOVAL OF POSTERIOR CAPSULAR OPACIFICATION WITH VITRECTOR;  Surgeon: Sherlynn Stalls, MD;  Location: [place];  Service: Ophthalmology;  Laterality: Right;   PHOTOCOAGULATION WITH LASER Left 01/30/2019   Procedure: PHOTOCOAGULATION WITH LASER;  Surgeon: Sherlynn Stalls, MD;  Location: [place];  Service: Ophthalmology;  Laterality: Left;       Family History  Problem Relation Age of Onset   Heart attack Mother    Heart attack Father    Cancer Maternal Aunt        Stomach   Cancer Paternal Aunt        Uterine,Colon    Social History   Tobacco Use   Smoking status: Former    Packs/day: 1.00    Pack years: 0.00    Types: Cigarettes    Quit date: 12/20/2005    Years since quitting: 14.9   Smokeless tobacco: Never  Substance Use Topics   Alcohol use: Not Currently    Comment: 09/22/2014 "stopped in 2007; drank occasionally when I did drink"   Drug use: No    Home Medications Prior to Admission medications   Medication Sig Start Date End Date Taking? Authorizing Provider  acetaminophen (TYLENOL) 325 MG tablet Take 650 mg by mouth every 4 (four) hours as needed for mild pain or headache. Not to exceed more that 3000 mg in 24 hrs    [provider]  baclofen  (LIORESAL) 10 MG tablet Take 10 mg by mouth 4 (four) times daily.     [provider]  calcium-vitamin D (OSCAL WITH D) 500-200 MG-UNIT tablet Take 1 tablet by mouth 2 (two) times daily.    [provider]  Cholecalciferol 2000 units TABS Take 2,000 Units by mouth daily.    [provider]  denosumab (PROLIA) 60 MG/ML SOLN injection Inject 60 mg into the skin every 6 (six) months. Administer in upper arm, thigh, or abdomen  ON THE 13TH April and Oct.    [provider]  Dextromethorphan-quiNIDine (NUEDEXTA) 20-10 MG capsule Take 1 capsule by mouth daily. For Pseudobulbar Affect (PBA)    [provider]  dipyridamole-aspirin (AGGRENOX) 200-25 MG per 12 hr capsule Take 1 capsule by mouth 2 (two) times daily. for CVA    [provider]  gabapentin (NEURONTIN) 100 MG capsule Take 100 mg by mouth 3 (three) times daily.    [provider]  ketorolac (ACULAR) 0.4 % SOLN Place 1 drop into both eyes daily. 09/28/19   [provider]  lisinopril (ZESTRIL) 10 MG tablet Take 10 mg by mouth daily.  [provider]  loperamide (IMODIUM) 2 MG capsule Take 2-4 mg by mouth See admin instructions. Take 4mg  after the initial loose stool then take 2mg  after each loose stool. Do not exceed 16mg  in a 24 hour period.    [provider]  loratadine (CLARITIN) 10 MG tablet Take 10 mg by mouth daily.    [provider]  metoprolol tartrate (LOPRESSOR) 25 MG tablet Take 0.5 tablets (12.5 mg total) by mouth 2 (two) times daily. 04/06/14   Verlee Monte, MD  mirabegron ER (MYRBETRIQ) 50 MG TB24 tablet Take 50 mg by mouth daily.    [provider]  nitroGLYCERIN (NITROSTAT) 0.4 MG SL tablet Place 0.4 mg under the tongue every 5 (five) minutes as needed for chest pain.    [provider]  pantoprazole (PROTONIX) 40 MG tablet Take 40 mg by mouth daily.     [provider]  polyethylene glycol (MIRALAX /  GLYCOLAX) packet Take 17 g by mouth daily as needed (constipation).     [provider]  prednisoLONE acetate (PRED FORTE) 1 % ophthalmic suspension Place 1 drop into both eyes daily. 09/28/19   [provider]  Prenatal Vit-Fe Fumarate-FA (PREPLUS) 27-1 MG TABS Take 1 tablet by mouth daily.     [provider]  sertraline (ZOLOFT) 50 MG tablet Take 50 mg by mouth daily.    [provider]  simvastatin (ZOCOR) 20 MG tablet Take 40 mg by mouth at bedtime.    [provider]  solifenacin (VESICARE) 5 MG tablet Take 5 mg by mouth daily.    [provider]  tamsulosin (FLOMAX) 0.4 MG CAPS capsule Take 0.4 mg by mouth at bedtime.    [provider]    Allergies    Penicillins  Review of Systems   Review of Systems  Neurological:  Positive for weakness.  All other systems reviewed and are negative.  Physical Exam Updated Vital Signs BP (!) 158/76   Pulse 84   Temp 98.1 F (36.7 C) (Oral)   Resp 17   Ht 1.753 m (5\' 9" )   Wt 74.8 kg   SpO2 95%   BMI 24.37 kg/m   Physical Exam Vitals and nursing note reviewed.  Constitutional:      General: He is not in acute distress.    Appearance: He is well-developed.  HENT:     Head: Normocephalic and atraumatic.     Right Ear: External ear normal.     Left Ear: External ear normal.  Eyes:     General: No scleral icterus.       Right eye: No discharge.        Left eye: No discharge.     Conjunctiva/sclera: Conjunctivae normal.  Neck:     Trachea: No tracheal deviation.  Cardiovascular:     Rate and Rhythm: Normal rate and regular rhythm.  Pulmonary:     Effort: Pulmonary effort is normal. No respiratory distress.     Breath sounds: Normal breath sounds. No stridor. No wheezing or rales.  Abdominal:     General: Bowel sounds are normal. There is no distension.     Palpations: Abdomen is soft.     Tenderness: There is no abdominal tenderness. There is no guarding or rebound.   Musculoskeletal:        General: No tenderness.     Cervical back: Neck supple.  Skin:    General: Skin is warm and dry.     Findings:  No rash.  Neurological:     Mental Status: He is alert and oriented to person, place, and time.     Cranial Nerves: No cranial nerve deficit (No facial droop, extraocular movements intact, tongue midline ).     Sensory: Sensory deficit present.     Motor: No abnormal muscle tone or seizure activity.     Coordination: Coordination abnormal.     Comments: Left-sided hemiparesis, difficulty lifting right arm and right leg off the bed no visual field cuts, no left or right sided neglect, , no nystagmus noted     ED Results / Procedures / Treatments   Labs (all labs ordered are listed, but only abnormal results are displayed) Labs Reviewed  COMPREHENSIVE METABOLIC PANEL - Abnormal; Notable for the following components:      Result Value   Glucose, Bld 108 (*)    Total Protein 6.4 (*)    Albumin 3.2 (*)    All other components within normal limits  RESP PANEL BY RT-PCR (FLU A&B, COVID) ARPGX2  ETHANOL  PROTIME-INR  APTT  CBC  DIFFERENTIAL  RAPID URINE DRUG SCREEN, HOSP PERFORMED  URINALYSIS, ROUTINE W REFLEX MICROSCOPIC    EKG EKG Interpretation  Date/Time:  Thursday November 18 2020 10:54:15 EDT Ventricular Rate:  89 PR Interval:  169 QRS Duration: 118 QT Interval:  334 QTC Calculation: 407 R Axis:   15 Text Interpretation: Sinus rhythm Nonspecific intraventricular conduction delay Abnormal T, consider ischemia, lateral leads Since last tracing rate faster Artifact Confirmed by Dorie Rank [phone]) on 11/18/2020 11:39:45 AM  Radiology CT HEAD WO CONTRAST  Result Date: 11/18/2020 CLINICAL DATA:  Right-sided weakness beginning yesterday. EXAM: CT HEAD WITHOUT CONTRAST TECHNIQUE: Contiguous axial images were obtained from the base of the skull through the vertex without intravenous contrast. COMPARISON:  03/19/2007 CT.  MRI 01/06/2020. FINDINGS:  Brain: Age related volume loss. Chronic small-vessel ischemic changes of the hemispheric white matter. Old lacunar infarction of the right thalamus. No evidence of recent infarction, mass lesion, hemorrhage, hydrocephalus or extra-axial collection. Congenital hypoplasia the corpus callosum. Vascular: There is atherosclerotic calcification of the major vessels at the base of the brain. Skull: Negative Sinuses/Orbits: Clear except for small amount of fluid and retention cyst in the left maxillary sinus. Orbits negative. Other: None IMPRESSION: No acute finding. Age related atrophy. Chronic small-vessel change of the white matter. 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 Congenital hypoplasia of the corpus callosum. Electronically Signed   By: Nelson Chimes M.D.   On: 11/18/2020 12:08    Procedures Procedures   Medications Ordered in ED Medications  sodium chloride 0.9 % bolus 500 mL (has no administration in time range)    Followed by  0.9 %  sodium chloride infusion (has no administration in time range)    ED Course  I have reviewed the triage vital signs and the nursing notes.  Pertinent labs & imaging results that were available during my care of the patient were reviewed by me and considered in my medical decision making (see chart for details).  Clinical Course as of 11/18/20 1248  Thu Nov 18, 2020  1238 Head CT does not show acute finding [JK]  1244 D/w Dr Rory Percy.  Pt is followed by Dr Leonie Man as outpatient.  Will need MRI in the hospital.   Agrees with admission considering his deficits. [JK]    Clinical Course User Index [JK] Dorie Rank, MD   MDM Rules/Calculators/A&P  Patient presents with acute weakness.  Symptoms concerning for the possibility of a new stroke.  Patient at baseline has severe deficits from previous stroke resulting in left-sided hemiparesis.  Initial CT scan does not show any acute abnormality.  Laboratory tests are unremarkable.  I do think  the patient will require further work-up including MRI and neurology evaluation.  We will consult the medical service for admission. Final Clinical Impression(s) / ED Diagnoses Final diagnoses:  Weakness     Dorie Rank, MD 11/18/20 1250

## 2020-11-18 NOTE — ED Triage Notes (Signed)
Yesterday experiencing right side weakness, sensation with lack of coordination. LKW 1500 yesterday.

## 2020-11-18 NOTE — Progress Notes (Addendum)
Pt admitted to 3W29 at this time from the ER.  He is alert and oriented x 4.  Denies pain at present. Pt unable to move left leg and left arm is contracted.  Tele placed on patient and CCMD called and second verification completed.  Call bell within reach and bed alarm on.  Pt verbalizes understanding to call and no attempts to get out of bed. Per Alvis Lemmings, ER RN, patient had all daily meds at facility today and she notified pharmacy.

## 2020-11-18 NOTE — ED Notes (Signed)
Patient reports he started having weakness in the right arm and leg yesterday. States he "had a hard time controlling his fork to eat snd I could hardly lift my leg". Patient states he reported these symptoms to the nurse at the nursing home yesterday and was told he could see the doctor today. Patient is noted to have contracture to the LUE and L footdrop from previous stroke. Otherwise patient is A/O.

## 2020-11-18 NOTE — Consult Note (Signed)
Neurology Consultation  Reason for Consult: right sided weakness. Referring Physician: Mechele Dawley, MD.   CC: new right sided weakness.   History is obtained from: patient, chart.   HPI: Gregory Kerr is a 80 y.o. 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 PMHx also includes BPH, HTN, HLD, depression, and borderline DM.  Patient is on a statin and Aggrenox as secondary stroke prevention. Patient reports that on 11/17/20 am, while eating breakfast, he noted he could not hold his fork with right hand. His arm and leg felt weak as well around 1500 hours 11/17/20. Today, after seeing his PCP, he was sent to the ED.   Patient denies HA, confusion, dysphagia, dysarthria, aphasia, blurry vision, diplopia, or worsening of left sided weakness. + right hand numbness and right sided weakness. No known history of Afib.   Workup in ED + for UTI, culture pending. LDL of 94 and statin changed to Lipitor from Zocor. Patient received ASA 325mg  once.   Patient is followed by Dr. Leonie Man outpatient with last OV 07/14/20. No changes were made to his treatment plan or medications per OV note.   At ALF, he requires assistance with toileting, dressing. Can bathe himself after staff sets it up. Does not ambulate. Wheelchair or bed bound.   Neurology asked to consult for right sided weakness and ? stroke.   ROS: A robust ROS was performed and is negative except as noted in the HPI.   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 His smoking use included cigarettes. He smoked an average of 1.00 packs per day. He has never used smokeless tobacco. He reports previous alcohol use. He reports that he does not use drugs.  Medications  Current Facility-Administered Medications:     stroke: mapping our early stages of recovery book, , Does not apply, Once, Mosetta Anis, MD   [COMPLETED] sodium chloride 0.9 % bolus 500 mL, 500 mL, Intravenous, Once, Last Rate: 500 mL/hr at 11/18/20 1353, 500 mL at 11/18/20 1353 **FOLLOWED BY** 0.9 %  sodium chloride infusion, 100 mL/hr, Intravenous, Continuous, Mosetta Anis, MD, Last Rate: 100 mL/hr at 11/18/20 1405, 100 mL/hr at 11/18/20 1405   acetaminophen (TYLENOL) tablet 650 mg, 650 mg, Oral, Q4H PRN **OR** acetaminophen (TYLENOL) 160 MG/5ML solution 650 mg, 650 mg, Per Tube, Q4H PRN **OR** acetaminophen (TYLENOL) suppository 650 mg, 650 mg, Rectal, Q4H PRN, Mosetta Anis, MD   aspirin tablet 325 mg, 325 mg, Oral, Once, Mosetta Anis, MD   atorvastatin (LIPITOR) tablet  40 mg, 40 mg, Oral, Daily, Mosetta Anis, MD   baclofen (LIORESAL) tablet 10 mg, 10 mg, Oral, QID, Mosetta Anis, MD   [START ON 11/19/2020] calcium-vitamin D (OSCAL WITH D) 500-200 MG-UNIT per tablet 1 tablet, 1 tablet, Oral, BID, Mosetta Anis, MD   [START ON 11/19/2020] darifenacin (ENABLEX) 24 hr tablet 7.5 mg, 7.5 mg, Oral, Daily, Mosetta Anis, MD   [START ON 11/19/2020] Dextromethorphan-quiNIDine (NUEDEXTA) 20-10 MG per capsule 1 capsule, 1 capsule, Oral, Daily, Mosetta Anis, MD   [START ON 11/19/2020] dipyridamole-aspirin (AGGRENOX) 200-25 MG per 12 hr capsule 1 capsule, 1 capsule, Oral, BID, Mosetta Anis, MD   enoxaparin  (LOVENOX) injection 40 mg, 40 mg, Subcutaneous, Q24H, Mosetta Anis, MD   gabapentin (NEURONTIN) capsule 100 mg, 100 mg, Oral, TID, Mosetta Anis, MD   mirabegron ER [facility]) tablet 50 mg, 50 mg, Oral, Daily, Mosetta Anis, MD   pantoprazole (PROTONIX) EC tablet 40 mg, 40 mg, Oral, Daily, Mosetta Anis, MD   polyethylene glycol (MIRALAX / GLYCOLAX) packet 17 g, 17 g, Oral, Daily, Mosetta Anis, MD   senna-docusate (Senokot-S) tablet 1 tablet, 1 tablet, Oral, QHS, Mosetta Anis, MD   [START ON 11/19/2020] sertraline (ZOLOFT) tablet 50 mg, 50 mg, Oral, Daily, Mosetta Anis, MD   tamsulosin [facility]) capsule 0.4 mg, 0.4 mg, Oral, QHS, Mosetta Anis, MD  Exam: Current vital signs: BP 131/67 (BP Location: Left Arm)   Pulse 78   Temp 98.2 F (36.8 C) (Oral)   Resp 16   Ht 5\' 9"  (1.753 m)   Wt 74.8 kg   SpO2 96%   BMI 24.37 kg/m  Vital signs in last 24 hours: Temp:  [98.1 F (36.7 C)-98.2 F (36.8 C)] 98.2 F (36.8 C) (06/30 1700) Pulse Rate:  [78-91] 78 (06/30 1700) Resp:  [13-20] 16 (06/30 1700) BP: (131-176)/(67-88) 131/67 (06/30 1700) SpO2:  [95 %-96 %] 96 % (06/30 1700) Weight:  [74.8 kg] 74.8 kg (06/30 1028)  PE: GENERAL: Chronically ill frail appearing male. Awake, alert in NAD.  HEENT: normocephalic and atraumatic. LUNGS - Normal respiratory effort.  CV - RRR. ABDOMEN - Soft, nontender. Ext: warm, well perfused. Psych: affect   NEURO:  Mental Status: Alert and oriented x 3.  Speech/Language: speech is without dysarthria or aphasia.  Naming, repetition, fluency, and comprehension intact.  Cranial Nerves:  II: PERRL  3 mm/brisk. visual fields full.  III, IV, VI: EOMI. Lid elevation symmetric and full.  V: sensation is intact and symmetrical to face.  VII: Smile is symmetrical.  VIII:hearing intact to voice. IX, X: palate elevation is symmetric. Phonation normal.  XI: normal sternocleidomastoid and trapezius muscle strength. GGE:ZMOQHU is symmetrical without  fasciculations.   Motor:  RUE: grips  4/5       triceps 4/5     biceps  4/5      LUE: grips  0/5      triceps  0/5      biceps   0/5 RLE:  knee  5/5  thigh  3+/5   plantar flexion  4+/5  dorsiflexion   4+/5 LLE:  knee  0/5   thigh  0/5     plantar flexion   0/5  dorsiflexion   0/5 Tone is rigid with noted contractures to LU/LLE.  Sensation- Intact to light touch bilaterally in all four extremities. Extinction absent to light touch to DSS.  Coordination: Unable to perform bilateral FNF. Unable to  perform HKS.  DTRs:  RUE: brachioradialis  2+ RLE:  patella 1+ LUE:  0 LLE: patella  0     Gait- deferred.  NIHSS:  1a Level of Consciousness: 0 1b LOC Questions: 0 1c LOC Commands: 0 2 Best Gaze: 0 3 Visual: 0 4 Facial Palsy: 0                            Motor LUE/LLE is chronic.  5a Motor Arm - left: 3 5b Motor Arm - Right: 0 6a Motor Leg - Left: 4 6b Motor Leg - Right: 1 7 Limb Ataxia: 0 8 Sensory: 0 9 Best Language: 0 10 Dysarthria: 0 11 Extinction and Inattention: 0 TOTAL: 8  MRS: 4.  Labs I have reviewed labs in epic and the results pertinent to this consultation are: INR 1.  aPTT 27 . UA +. Glucose 108. UDS -. ETOH < 10.   CBC    Component Value Date/Time   WBC 9.1 11/18/2020 1055   RBC 4.86 11/18/2020 1055   HGB 14.5 11/18/2020 1055   HGB 14.9 12/02/2019 1052   HCT 43.2 11/18/2020 1055   HCT 45.0 12/02/2019 1052   PLT 160 11/18/2020 1055   PLT 163 12/02/2019 1052   MCV 88.9 11/18/2020 1055   MCV 90 12/02/2019 1052   MCH 29.8 11/18/2020 1055   MCHC 33.6 11/18/2020 1055   RDW 13.4 11/18/2020 1055   RDW 13.2 12/02/2019 1052   LYMPHSABS 0.9 11/18/2020 1055   MONOABS 0.6 11/18/2020 1055   EOSABS 0.1 11/18/2020 1055   BASOSABS 0.1 11/18/2020 1055    CMP     Component Value Date/Time   NA 140 11/18/2020 1055   NA 141 12/02/2019 1052   K 3.8 11/18/2020 1055   CL 106 11/18/2020 1055   CO2 24 11/18/2020 1055   GLUCOSE 108 (H) 11/18/2020 1055   BUN 20  11/18/2020 1055   BUN 16 12/02/2019 1052   CREATININE 1.03 11/18/2020 1055   CALCIUM 9.2 11/18/2020 1055   PROT 6.4 (L) 11/18/2020 1055   PROT 6.5 12/02/2019 1052   ALBUMIN 3.2 (L) 11/18/2020 1055   ALBUMIN 3.9 12/02/2019 1052   AST 23 11/18/2020 1055   ALT 18 11/18/2020 1055   ALKPHOS 81 11/18/2020 1055   BILITOT 0.4 11/18/2020 1055   BILITOT 0.4 12/02/2019 1052   GFRNONAA >60 11/18/2020 1055   GFRAA 100 12/02/2019 1052    Lipid Panel     Component Value Date/Time   CHOL 164 11/18/2020 1331   CHOL 192 07/14/2020 1321   TRIG 138 11/18/2020 1331   HDL 42 11/18/2020 1331   HDL 40 07/14/2020 1321   CHOLHDL 3.9 11/18/2020 1331   VLDL 28 11/18/2020 1331   LDLCALC 94 11/18/2020 1331   LDLCALC 119 (H) 07/14/2020 1321   Imaging MD reviewed the images obtained.  CT head No acute finding. Age related atrophy. Chronic small-vessel change of the white matter. 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 Congenital hypoplasia of the corpus callosum.  Assessment: 80 yo male who presented to ED today after experiencing new right sided weakness yesterday at his ALF. Sequelae from old stroke is left spastic hemiplegia. No new stroke on CTH. Given his right sided weakness, stroke is certainly in the realm of possibilities, so MRI brain is prudent. Nothing about what occurred at ALF or his presentation is suspicious for seizures. He has a UTI which could certainly contribute to his new  symptoms and urine culture is ordered. His stroke risk factors include prior stroke, chronic small vessel ischemic disease, HTN, and HLD.   Impression: -New right sided weakness ?  New stroke vs some sort of recrudescence although the old stroke was affecting the left side. -Left spastic hemiplegia as a result of 2007 stroke.  -UTI.   Recommendations/Plan:  -MRI brain. If positive for stroke, patient should have a complete stroke workup.  -Some of stroke workup is completed.  -Agree with change to Lipitor  given LDL over 70.  -Check HbA1c. Goal < 7.  -He is outside the window for permissive HTN, so normotensive is goal.  -Continue Aggrenox.  -urine culture ordered and treat UTI per medicine team.  -aggressive secondary stoke prevention.  -Consider PT/OT as needed.  -Recommend infectious workup.  -f/up with Dr. Leonie Man as scheduled, or if MRI brain positive for new stroke, have patient see Dr. Leonie Man 4 weeks after discharge.   Pt seen by Clance Boll, NP/Neuro and later by MD. Note/plan to be edited by MD as needed.  Pager: [phone]  Attending Neurohospitalist Addendum Patient seen and examined with APP/Resident. Agree with the history and physical as documented above. Agree with the plan as documented, which I helped formulate. I have independently reviewed the chart, obtained history, review of systems and examined the patient.I have personally reviewed pertinent head/neck/spine imaging (CT/MRI). Please feel free to call with any questions.  -- Amie Portland, MD Neurologist [facility] Pager: [phone]

## 2020-11-18 NOTE — ED Triage Notes (Signed)
Not on blood thinner

## 2020-11-19 ENCOUNTER — Inpatient Hospital Stay (HOSPITAL_COMMUNITY): Payer: Medicare Other

## 2020-11-19 ENCOUNTER — Observation Stay (HOSPITAL_COMMUNITY): Payer: Medicare Other

## 2020-11-19 DIAGNOSIS — I2510 Atherosclerotic heart disease of native coronary artery without angina pectoris: Secondary | ICD-10-CM | POA: Diagnosis present

## 2020-11-19 DIAGNOSIS — R531 Weakness: Secondary | ICD-10-CM | POA: Diagnosis present

## 2020-11-19 DIAGNOSIS — E119 Type 2 diabetes mellitus without complications: Secondary | ICD-10-CM | POA: Diagnosis present

## 2020-11-19 DIAGNOSIS — I639 Cerebral infarction, unspecified: Secondary | ICD-10-CM | POA: Diagnosis present

## 2020-11-19 DIAGNOSIS — B965 Pseudomonas (aeruginosa) (mallei) (pseudomallei) as the cause of diseases classified elsewhere: Secondary | ICD-10-CM | POA: Diagnosis present

## 2020-11-19 DIAGNOSIS — I6381 Other cerebral infarction due to occlusion or stenosis of small artery: Secondary | ICD-10-CM | POA: Diagnosis present

## 2020-11-19 DIAGNOSIS — I69354 Hemiplegia and hemiparesis following cerebral infarction affecting left non-dominant side: Secondary | ICD-10-CM | POA: Diagnosis not present

## 2020-11-19 DIAGNOSIS — K219 Gastro-esophageal reflux disease without esophagitis: Secondary | ICD-10-CM | POA: Diagnosis present

## 2020-11-19 DIAGNOSIS — R471 Dysarthria and anarthria: Secondary | ICD-10-CM | POA: Diagnosis present

## 2020-11-19 DIAGNOSIS — M810 Age-related osteoporosis without current pathological fracture: Secondary | ICD-10-CM | POA: Diagnosis present

## 2020-11-19 DIAGNOSIS — Z9049 Acquired absence of other specified parts of digestive tract: Secondary | ICD-10-CM | POA: Diagnosis not present

## 2020-11-19 DIAGNOSIS — I63312 Cerebral infarction due to thrombosis of left middle cerebral artery: Secondary | ICD-10-CM | POA: Diagnosis not present

## 2020-11-19 DIAGNOSIS — I6502 Occlusion and stenosis of left vertebral artery: Secondary | ICD-10-CM | POA: Diagnosis present

## 2020-11-19 DIAGNOSIS — N400 Enlarged prostate without lower urinary tract symptoms: Secondary | ICD-10-CM | POA: Diagnosis present

## 2020-11-19 DIAGNOSIS — R29810 Facial weakness: Secondary | ICD-10-CM | POA: Diagnosis present

## 2020-11-19 DIAGNOSIS — I82C12 Acute embolism and thrombosis of left internal jugular vein: Secondary | ICD-10-CM | POA: Diagnosis not present

## 2020-11-19 DIAGNOSIS — I10 Essential (primary) hypertension: Secondary | ICD-10-CM

## 2020-11-19 DIAGNOSIS — F32A Depression, unspecified: Secondary | ICD-10-CM | POA: Diagnosis present

## 2020-11-19 DIAGNOSIS — E785 Hyperlipidemia, unspecified: Secondary | ICD-10-CM | POA: Diagnosis present

## 2020-11-19 DIAGNOSIS — Z8673 Personal history of transient ischemic attack (TIA), and cerebral infarction without residual deficits: Secondary | ICD-10-CM | POA: Diagnosis not present

## 2020-11-19 DIAGNOSIS — I6523 Occlusion and stenosis of bilateral carotid arteries: Secondary | ICD-10-CM | POA: Diagnosis present

## 2020-11-19 DIAGNOSIS — G8191 Hemiplegia, unspecified affecting right dominant side: Secondary | ICD-10-CM | POA: Diagnosis present

## 2020-11-19 DIAGNOSIS — E7800 Pure hypercholesterolemia, unspecified: Secondary | ICD-10-CM

## 2020-11-19 DIAGNOSIS — M21372 Foot drop, left foot: Secondary | ICD-10-CM | POA: Diagnosis present

## 2020-11-19 DIAGNOSIS — I6389 Other cerebral infarction: Secondary | ICD-10-CM | POA: Diagnosis not present

## 2020-11-19 DIAGNOSIS — K2289 Other specified disease of esophagus: Secondary | ICD-10-CM | POA: Diagnosis present

## 2020-11-19 DIAGNOSIS — Z8249 Family history of ischemic heart disease and other diseases of the circulatory system: Secondary | ICD-10-CM | POA: Diagnosis not present

## 2020-11-19 DIAGNOSIS — Z20822 Contact with and (suspected) exposure to covid-19: Secondary | ICD-10-CM | POA: Diagnosis present

## 2020-11-19 DIAGNOSIS — N3281 Overactive bladder: Secondary | ICD-10-CM | POA: Diagnosis present

## 2020-11-19 DIAGNOSIS — I872 Venous insufficiency (chronic) (peripheral): Secondary | ICD-10-CM | POA: Diagnosis present

## 2020-11-19 DIAGNOSIS — N3000 Acute cystitis without hematuria: Secondary | ICD-10-CM | POA: Diagnosis not present

## 2020-11-19 DIAGNOSIS — N390 Urinary tract infection, site not specified: Secondary | ICD-10-CM | POA: Diagnosis present

## 2020-11-19 LAB — CBC WITH DIFFERENTIAL/PLATELET
Abs Immature Granulocytes: 0.01 10*3/uL (ref 0.00–0.07)
Basophils Absolute: 0 10*3/uL (ref 0.0–0.1)
Basophils Relative: 0 %
Eosinophils Absolute: 0.1 10*3/uL (ref 0.0–0.5)
Eosinophils Relative: 2 %
HCT: 41.5 % (ref 39.0–52.0)
Hemoglobin: 13.7 g/dL (ref 13.0–17.0)
Immature Granulocytes: 0 %
Lymphocytes Relative: 21 %
Lymphs Abs: 1.1 10*3/uL (ref 0.7–4.0)
MCH: 29.3 pg (ref 26.0–34.0)
MCHC: 33 g/dL (ref 30.0–36.0)
MCV: 88.9 fL (ref 80.0–100.0)
Monocytes Absolute: 0.5 10*3/uL (ref 0.1–1.0)
Monocytes Relative: 10 %
Neutro Abs: 3.5 10*3/uL (ref 1.7–7.7)
Neutrophils Relative %: 67 %
Platelets: 148 10*3/uL — ABNORMAL LOW (ref 150–400)
RBC: 4.67 MIL/uL (ref 4.22–5.81)
RDW: 13.4 % (ref 11.5–15.5)
WBC: 5.2 10*3/uL (ref 4.0–10.5)
nRBC: 0 % (ref 0.0–0.2)

## 2020-11-19 LAB — BASIC METABOLIC PANEL
Anion gap: 6 (ref 5–15)
BUN: 16 mg/dL (ref 8–23)
CO2: 25 mmol/L (ref 22–32)
Calcium: 8.3 mg/dL — ABNORMAL LOW (ref 8.9–10.3)
Chloride: 107 mmol/L (ref 98–111)
Creatinine, Ser: 0.89 mg/dL (ref 0.61–1.24)
GFR, Estimated: >60 mL/min (ref >60)
Glucose, Bld: 84 mg/dL (ref 70–99)
Potassium: 4.3 mmol/L (ref 3.5–5.1)
Sodium: 138 mmol/L (ref 135–145)

## 2020-11-19 LAB — HEMOGLOBIN A1C
Hgb A1c MFr Bld: 5.6 % (ref 4.8–5.6)
Mean Plasma Glucose: 114 mg/dL

## 2020-11-19 LAB — CBC
HCT: 40 % (ref 39.0–52.0)
Hemoglobin: 13.3 g/dL (ref 13.0–17.0)
MCH: 29.4 pg (ref 26.0–34.0)
MCHC: 33.3 g/dL (ref 30.0–36.0)
MCV: 88.3 fL (ref 80.0–100.0)
Platelets: 136 10*3/uL — ABNORMAL LOW (ref 150–400)
RBC: 4.53 MIL/uL (ref 4.22–5.81)
RDW: 13.3 % (ref 11.5–15.5)
WBC: 4.6 10*3/uL (ref 4.0–10.5)
nRBC: 0 % (ref 0.0–0.2)

## 2020-11-19 LAB — ECHOCARDIOGRAM COMPLETE
Area-P 1/2: 4.26 cm2
Height: 69 in
S' Lateral: 2.7 cm
Weight: 2640 oz

## 2020-11-19 MED ORDER — IOHEXOL 350 MG/ML SOLN
100.0000 mL | Freq: Once | INTRAVENOUS | Status: AC | PRN
  Administered 2020-11-19: 100 mL via INTRAVENOUS

## 2020-11-19 MED ORDER — SULFAMETHOXAZOLE-TRIMETHOPRIM 800-160 MG PO TABS
1.0000 | ORAL_TABLET | Freq: Two times a day (BID) | ORAL | Status: DC
  Administered 2020-11-19 – 2020-11-20 (×2): 1 via ORAL
  Filled 2020-11-19 (×3): qty 1

## 2020-11-19 MED ORDER — CLOPIDOGREL BISULFATE 75 MG PO TABS
75.0000 mg | ORAL_TABLET | Freq: Every day | ORAL | Status: DC
  Administered 2020-11-19 – 2020-11-21 (×3): 75 mg via ORAL
  Filled 2020-11-19 (×3): qty 1

## 2020-11-19 MED ORDER — ASPIRIN EC 81 MG PO TBEC
81.0000 mg | DELAYED_RELEASE_TABLET | Freq: Every day | ORAL | Status: DC
  Administered 2020-11-20 – 2020-11-21 (×2): 81 mg via ORAL
  Filled 2020-11-19 (×2): qty 1

## 2020-11-19 NOTE — Care Management Obs Status (Signed)
[facility] NOTIFICATION   Patient Details  Name: Gregory Kerr MRN: [id number] Date of Birth: 04/20/1941   Medicare Observation Status Notification Given:  Yes    Geralynn Ochs, LCSW 11/19/2020, 4:01 PM

## 2020-11-19 NOTE — Hospital Course (Addendum)
Gregory Kerr is a 80 y.o. male with a PMHx of  CAD, HLD, HTN , BPH and stroke with residual left spastic hemiplegia who was admitted for new onset  right sided weakness with MRI findings consistent with acute CVA. Also found to have a UTI.  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

## 2020-11-19 NOTE — Evaluation (Signed)
Physical Therapy Evaluation Patient Details Name: Gregory Kerr MRN: [id number] DOB: [birth year] Today's Date: 11/19/2020   History of Present Illness  Pt is a 80 y.o. male admitted 6/30 with new onset R side weakness. MRI revealed small focus of acute ischemia within the left centrum semiovale.  PMH: R thalamic infarction (2007) with L spastic hemiplegia, BPH, HTN, HLD, depression, and borderline DM.   Clinical Impression  Pt admitted with above diagnosis. PTA pt resided in ALF at [facility]. He was mod I mobility at w/c level (nonambulatory). On eval, he required +2 max assist bed mobility and max assist to maintain sitting balance EOB. Pt currently with functional limitations due to the deficits listed below (see PT Problem List). Pt will benefit from skilled PT to increase their independence and safety with mobility to allow discharge to the venue listed below.       Follow Up Recommendations SNF    Equipment Recommendations  None recommended by PT    Recommendations for Other Services       Precautions / Restrictions Precautions Precautions: Fall;Other (comment) Precaution Comments: spastic hemiplegia LUE/LE      Mobility  Bed Mobility Overal bed mobility: Needs Assistance Bed Mobility: Rolling;Supine to Sit;Sit to Supine Rolling: Max assist   Supine to sit: HOB elevated;+2 for physical assistance;Max assist Sit to supine: +2 for physical assistance;Max assist   General bed mobility comments: heavy push posteriorly, difficulty sequencing/motor planning, assist with all aspects of mobility    Transfers                 General transfer comment: unable to safely transition beyond EOB, will initially need maximove  Ambulation/Gait             General Gait Details: nonambulatory at baseline  Stairs            Wheelchair Mobility    Modified Rankin (Stroke Patients Only) Modified Rankin (Stroke Patients Only) Pre-Morbid Rankin Score: Moderate  disability Modified Rankin: Severe disability     Balance Overall balance assessment: Needs assistance Sitting-balance support: Single extremity supported;Feet supported Sitting balance-Leahy Scale: Zero Sitting balance - Comments: max assist to maintain sitting balance EOB                                     Pertinent Vitals/Pain Pain Assessment: Faces Faces Pain Scale: Hurts little more Pain Location: LUE/LE Pain Descriptors / Indicators: Spasm Pain Intervention(s): Limited activity within patient's tolerance;Monitored during session;Repositioned;RN gave pain meds during session    Home Living Family/patient expects to be discharged to:: Assisted living               Home Equipment: Wheelchair - manual;Shower seat - built in;Grab bars - tub/shower;Grab bars - toilet      Prior Function Level of Independence: Needs assistance   Gait / Transfers Assistance Needed: Mod I at wheelchair level.  ADL's / Homemaking Assistance Needed: reports mod I for basic ADLs        Hand Dominance   Dominant Hand: Right    Extremity/Trunk Assessment   Upper Extremity Assessment Upper Extremity Assessment: LUE deficits/detail LUE Deficits / Details: flexion contractures    Lower Extremity Assessment Lower Extremity Assessment: LLE deficits/detail;RLE deficits/detail RLE Deficits / Details: 3/5 grossly graded LLE Deficits / Details: spasticity in hip/knee extensors and hip adductors. Able to passively move through partial ROM. Fixed contracture L foot in PF  Communication   Communication: No difficulties  Cognition Arousal/Alertness: Awake/alert Behavior During Therapy: WFL for tasks assessed/performed Overall Cognitive Status: Within Functional Limits for tasks assessed                                        General Comments      Exercises     Assessment/Plan    PT Assessment Patient needs continued PT services  PT Problem  List Decreased strength;Decreased mobility;Decreased activity tolerance;Decreased balance;Pain       PT Treatment Interventions DME instruction;Therapeutic activities;Therapeutic exercise;Patient/family education;Balance training;Functional mobility training    PT Goals (Current goals can be found in the Care Plan section)  Acute Rehab PT Goals Patient Stated Goal: get in his wheelchair PT Goal Formulation: With patient Time For Goal Achievement: 12/03/20 Potential to Achieve Goals: Good    Frequency Min 3X/week   Barriers to discharge        Co-evaluation               AM-PAC PT "6 Clicks" Mobility  Outcome Measure Help needed turning from your back to your side while in a flat bed without using bedrails?: A Lot Help needed moving from lying on your back to sitting on the side of a flat bed without using bedrails?: A Lot Help needed moving to and from a bed to a chair (including a wheelchair)?: Total Help needed standing up from a chair using your arms (e.g., wheelchair or bedside chair)?: Total Help needed to walk in hospital room?: Total Help needed climbing 3-5 steps with a railing? : Total 6 Click Score: 8    End of Session   Activity Tolerance: Patient tolerated treatment well Patient left: in bed;Other (comment) (transport arrived to take pt to CT) Nurse Communication: Mobility status;Need for lift equipment PT Visit Diagnosis: Other abnormalities of gait and mobility (R26.89);Pain    Time: 2683-4196 PT Time Calculation (min) (ACUTE ONLY): 17 min   Charges:   PT Evaluation $PT Eval Moderate Complexity: 1 Mod          Lorrin Goodell, PT  Office # [phone] Pager [phone]   Lorriane Shire 11/19/2020, 1:02 PM

## 2020-11-19 NOTE — Progress Notes (Addendum)
STROKE TEAM PROGRESS NOTE   SUBJECTIVE (INTERVAL HISTORY) His RN is at the bedside.  Overall his condition is stable.  Patient lying in bed, stated that he has new right-sided weakness, not able to feed himself in the nursing home.  He still has significant left-sided weakness with contracture from previous stroke.  MRI showed left CR infarct.   OBJECTIVE Temp:  [97.6 F (36.4 C)-98.6 F (37 C)] 98.1 F (36.7 C) (07/01 1527) Pulse Rate:  [75-88] 78 (07/01 1527) Cardiac Rhythm: Normal sinus rhythm (07/01 0700) Resp:  [16-20] 18 (07/01 1527) BP: (131-163)/(67-85) 154/84 (07/01 1527) SpO2:  [95 %-97 %] 95 % (07/01 1527)  No results for input(s): GLUCAP in the last 168 hours. Recent Labs  Lab 11/18/20 1055 11/19/20 0334  NA 140 138  K 3.8 4.3  CL 106 107  CO2 24 25  GLUCOSE 108* 84  BUN 20 16  CREATININE 1.03 0.89  CALCIUM 9.2 8.3*   Recent Labs  Lab 11/18/20 1055  AST 23  ALT 18  ALKPHOS 81  BILITOT 0.4  PROT 6.4*  ALBUMIN 3.2*   Recent Labs  Lab 11/18/20 1055 11/19/20 0334  WBC 9.1 4.6  NEUTROABS 7.5  --   HGB 14.5 13.3  HCT 43.2 40.0  MCV 88.9 88.3  PLT 160 136*   No results for input(s): CKTOTAL, CKMB, CKMBINDEX, TROPONINI in the last 168 hours. Recent Labs    11/18/20 1055  LABPROT 13.5  INR 1.0   Recent Labs    11/18/20 1044  COLORURINE YELLOW  LABSPEC 1.018  PHURINE 6.0  GLUCOSEU NEGATIVE  HGBUR NEGATIVE  BILIRUBINUR NEGATIVE  KETONESUR NEGATIVE  PROTEINUR NEGATIVE  NITRITE POSITIVE*  LEUKOCYTESUR LARGE*       Component Value Date/Time   CHOL 164 11/18/2020 1331   CHOL 192 07/14/2020 1321   TRIG 138 11/18/2020 1331   HDL 42 11/18/2020 1331   HDL 40 07/14/2020 1321   CHOLHDL 3.9 11/18/2020 1331   VLDL 28 11/18/2020 1331   LDLCALC 94 11/18/2020 1331   LDLCALC 119 (H) 07/14/2020 1321   Lab Results  Component Value Date   HGBA1C 5.6 11/18/2020      Component Value Date/Time   LABOPIA NONE DETECTED 11/18/2020 1044    COCAINSCRNUR NONE DETECTED 11/18/2020 1044   LABBENZ NONE DETECTED 11/18/2020 1044   AMPHETMU NONE DETECTED 11/18/2020 1044   THCU NONE DETECTED 11/18/2020 1044   LABBARB NONE DETECTED 11/18/2020 1044    Recent Labs  Lab 11/18/20 1055  ETH <10    I have personally reviewed the radiological images below and agree with the radiology interpretations.  CT ANGIO HEAD NECK W WO CM  Addendum Date: 11/19/2020   ADDENDUM REPORT: 11/19/2020 13:26 ADDENDUM: Findings discussed with Dr. Erlinda Hong via telephone at 1:04 p.m. Electronically Signed   By: Margaretha Sheffield MD   On: 11/19/2020 13:26   Result Date: 11/19/2020 CLINICAL DATA:  Stroke follow-up. EXAM: CT ANGIOGRAPHY HEAD AND NECK TECHNIQUE: Multidetector CT imaging of the head and neck was performed using the standard protocol during bolus administration of intravenous contrast. Multiplanar CT image reconstructions and MIPs were obtained to evaluate the vascular anatomy. Carotid stenosis measurements (when applicable) are obtained utilizing NASCET criteria, using the distal internal carotid diameter as the denominator. CONTRAST:  179mL OMNIPAQUE IOHEXOL 350 MG/ML SOLN COMPARISON:  MRI and CT November 18, 2020.  MRA January 06, 2020. FINDINGS: CT HEAD FINDINGS Brain: Known small acute infarct in the left centrum semiovale was better characterized on  recent MRI head. Similar chronic microvascular ischemic disease and atrophy. No acute hemorrhage. No interval acute large vascular territory infarct. No mass lesion, extra-axial fluid collection, hydrocephalus, mass lesion, or abnormal mass effect. Vascular: See below. Skull: No acute fracture. Sinuses: Left maxillary sinus retention cyst. Orbits: No acute orbital findings. Review of the MIP images confirms the above findings CTA NECK FINDINGS Aortic arch: Atherosclerosis of the aorta and its branch vessels. The aortic arch is dilated, measuring 3.6 cm. Right carotid system: Calcific and noncalcific atherosclerosis at the  carotid bifurcation with approximately 30-40% stenosis. Left carotid system: Predominately calcific atherosclerosis with approximately 30% stenosis. Vertebral arteries: Co dominant. No evidence of greater than 50% stenosis. Mild atherosclerotic narrowing of the left vertebral artery origin. Bilateral vertebral arteries are tortuous. Skeleton: Severe degenerative disease at C4-C5, C5-C6, and C6-C7 with disc height loss, endplate sclerosis and endplate spurring. Other neck: Patulous esophagus with debris. Upper chest: Rounded opacity in the lateral left midlung (series 7, image 172) Review of the MIP images confirms the above findings CTA HEAD FINDINGS Evaluation limited by venous contamination Anterior circulation: Poor opacification limits evaluation with bilateral proximal carotid canal narrowing at the skull base that may be severe. Lateralized course of both carotid canals with preserved bony covering along the middle ear. Calcific atherosclerosis of bilateral cavernous and paraclinoid ICAs with mild-to-moderate narrowing bilaterally. Bilateral MCAs and ACAs are patent. Moderate left M1 MCA stenosis. Weight limited evaluation of and 2 MCA branches due to venous contamination. No aneurysm identified. Posterior circulation: Bilateral intradural vertebral arteries and, basilar artery, and bilateral none posterior arteries are patent without evidence of a proximal flow limiting stenosis. Multifocal mild narrowing of the PCAs bilaterally. No aneurysm identified. Venous sinuses: Non opacification of the small left sigmoid sinus and jugular bulb. Review of the MIP images confirms the above findings IMPRESSION: CT Head: 1. Known small acute infarct in the left centrum semiovale was better characterized on recent MRI head. 2. No evidence of other acute intracranial abnormality. CTA Head: 1. Likely severe bilateral proximal carotid canal narrowing at the skull base. Lateralized course of both carotid canals with preserved  bony covering along the middle ear. 2. Moderate left M1 MCA stenosis. Limited evaluation of the M2 MCA branches due to venous contamination. 3. Mild-to-moderate narrowing of the cavernous and paraclinoid ICAs. 4. Non opacification of a small left sigmoid sinus and jugular bulb. While this could be due to contrast timing and narrowing of the sinus, dural sinus thrombosis is not excluded given the right sided sigmoid sinus and jugular bulb are opacified on this study. Postcontrast MRI could further evaluate for dural sinus thrombosis if clinically indicated. CTA Neck: 1. Bilateral carotid bifurcation atherosclerosis with approximately 30-40% stenosis on the right and 30% stenosis on the left. 2. Rounded opacity in the lateral left midlung (series 7, image 172) which is incompletely imaged on this study. Recommend dedicated chest imaging to further characterize. 3. Patulous esophagus with debris, placing the patient at increased risk for aspiration. 4. Dilated aortic arch, measuring up to 3.6 cm. Recommend annual imaging followup by CTA or MRA. This recommendation follows 2010 ACCF/AHA/AATS/ACR/ASA/SCA/SCAI/SIR/STS/SVM Guidelines for the Diagnosis and Management of Patients with Thoracic Aortic Disease. Circulation.2010; 121: G836-O294. Aortic aneurysm NOS (ICD10-I71.9) Electronically Signed: By: Margaretha Sheffield MD On: 11/19/2020 12:45   CT HEAD WO CONTRAST  Result Date: 11/18/2020 CLINICAL DATA:  Right-sided weakness beginning yesterday. EXAM: CT HEAD WITHOUT CONTRAST TECHNIQUE: Contiguous axial images were obtained from the base of the skull through the  vertex without intravenous contrast. COMPARISON:  03/19/2007 CT.  MRI 01/06/2020. FINDINGS: Brain: Age related volume loss. Chronic small-vessel ischemic changes of the hemispheric white matter. Old lacunar infarction of the right thalamus. No evidence of recent infarction, mass lesion, hemorrhage, hydrocephalus or extra-axial collection. Congenital hypoplasia  the corpus callosum. Vascular: There is atherosclerotic calcification of the major vessels at the base of the brain. Skull: Negative Sinuses/Orbits: Clear except for small amount of fluid and retention cyst in the left maxillary sinus. Orbits negative. Other: None IMPRESSION: No acute finding. Age related atrophy. Chronic small-vessel change of the white matter. 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 Congenital hypoplasia of the corpus callosum. Electronically Signed   By: Nelson Chimes M.D.   On: 11/18/2020 12:08   MR BRAIN WO CONTRAST  Result Date: 11/18/2020 CLINICAL DATA:  Stroke follow-up EXAM: MRI HEAD WITHOUT CONTRAST TECHNIQUE: Multiplanar, multiecho pulse sequences of the brain and surrounding structures were obtained without intravenous contrast. COMPARISON:  01/06/2020 FINDINGS: Brain: Small focus of abnormal diffusion restriction within the left centrum semiovale. No acute or chronic hemorrhage. There is multifocal hyperintense T2-weighted signal within the white matter. Advanced atrophy for age. The midline structures are normal. Vascular: Major flow voids are preserved. Skull and upper cervical spine: Normal calvarium and skull base. Visualized upper cervical spine and soft tissues are normal. Sinuses/Orbits:Left maxillary retention cyst.  Normal orbits. IMPRESSION: 1. Small focus of acute ischemia within the left centrum semiovale. No hemorrhage or mass effect. 2. Advanced atrophy for age. Electronically Signed   By: Ulyses Jarred M.D.   On: 11/18/2020 23:00   ECHOCARDIOGRAM COMPLETE  Result Date: 11/19/2020    ECHOCARDIOGRAM REPORT   Patient Name:   Gregory Kerr Date of Exam: 11/19/2020 Medical Rec #:  [id number]     Height:       69.0 in Accession #:    [id number]    Weight:       165.0 lb Date of Birth:  1940-08-10    BSA:          1.904 m Patient Age:    80 years      BP:           154/78 mmHg Patient Gender: M             HR:           75 bpm. Exam Location:  Inpatient Procedure: 2D  Echo, Cardiac Doppler and Color Doppler Indications:    Stroke I63.9  History:        Patient has prior history of Echocardiogram examinations, most                 recent 04/03/2014. Stroke; Risk Factors:Hypertension and                 Dyslipidemia. Left spastic hemiplegia.  Sonographer:    Darlina Sicilian RDCS Referring Phys: [place]  Sonographer Comments: No apical window. IMPRESSIONS  1. Left ventricular ejection fraction, by estimation, is 60 to 65%. The left ventricle has normal function. The left ventricle has no regional wall motion abnormalities. Left ventricular diastolic parameters are consistent with Grade II diastolic dysfunction (pseudonormalization). Elevated left ventricular end-diastolic pressure.  2. Right ventricular systolic function is mildly reduced. The right ventricular size is normal.  3. The mitral valve is grossly normal. No evidence of mitral valve regurgitation. No evidence of mitral stenosis.  4. The aortic valve is normal in structure. Aortic valve regurgitation is trivial.  5. Aortic  dilatation noted. There is mild dilatation of the ascending aorta, measuring 39 mm. FINDINGS  Left Ventricle: Left ventricular ejection fraction, by estimation, is 60 to 65%. The left ventricle has normal function. The left ventricle has no regional wall motion abnormalities. The left ventricular internal cavity size was normal in size. There is  no left ventricular hypertrophy. Left ventricular diastolic parameters are consistent with Grade II diastolic dysfunction (pseudonormalization). Elevated left ventricular end-diastolic pressure. Right Ventricle: The right ventricular size is normal. Right vetricular wall thickness was not well visualized. Right ventricular systolic function is mildly reduced. Left Atrium: Left atrial size was normal in size. Right Atrium: Right atrial size was normal in size. Pericardium: There is no evidence of pericardial effusion. Mitral Valve: The mitral valve is  grossly normal. No evidence of mitral valve regurgitation. No evidence of mitral valve stenosis. Tricuspid Valve: The tricuspid valve is grossly normal. Tricuspid valve regurgitation is not demonstrated. Aortic Valve: The aortic valve is normal in structure. Aortic valve regurgitation is trivial. Pulmonic Valve: The pulmonic valve was normal in structure. Pulmonic valve regurgitation is not visualized. Aorta: Aortic dilatation noted. There is mild dilatation of the ascending aorta, measuring 39 mm. IAS/Shunts: The atrial septum is grossly normal.  LEFT VENTRICLE PLAX 2D LVIDd:         4.30 cm  Diastology LVIDs:         2.70 cm  LV e' medial:    5.65 cm/s LV PW:         0.80 cm  LV E/e' medial:  15.6 LV IVS:        0.80 cm  LV e' lateral:   3.79 cm/s LVOT diam:     2.20 cm  LV E/e' lateral: 23.3 LV SV:         29 LV SV Index:   15 LVOT Area:     3.80 cm  RIGHT VENTRICLE RV S prime:     12.60 cm/s TAPSE (M-mode): 1.6 cm LEFT ATRIUM           Index LA diam:      3.60 cm 1.89 cm/m LA Vol (A4C): 25.5 ml 13.39 ml/m  AORTIC VALVE LVOT Vmax:   50.20 cm/s LVOT Vmean:  32.900 cm/s LVOT VTI:    0.077 m  AORTA Ao Root diam: 3.50 cm Ao Asc diam:  3.90 cm MITRAL VALVE MV Area (PHT): 4.26 cm    SHUNTS MV Decel Time: 178 msec    Systemic VTI:  0.08 m MV E velocity: 88.40 cm/s  Systemic Diam: 2.20 cm MV A velocity: 64.20 cm/s MV E/A ratio:  1.38 Mertie Moores MD Electronically signed by Mertie Moores MD Signature Date/Time: 11/19/2020/1:06:49 PM    Final    VAS [place] UPPER EXTREMITY VENOUS DUPLEX  Result Date: 11/19/2020 UPPER VENOUS STUDY  Patient Name:  Gregory Kerr  Date of Exam:   11/19/2020 Medical Rec #: [id number]      Accession #:    [id number] Date of Birth: [birth year]     Patient Gender: M Patient Age:   79Y Exam Location:  [facility] Procedure:      VAS [place] UPPER EXTREMITY VENOUS DUPLEX Referring Phys: [id number] [place] --------------------------------------------------------------------------------   Indications: left IJ occlusion on CTA but patent on MRI Comparison Study: no prior Performing Technologist: Archie Patten RVS  Examination Guidelines: A complete evaluation includes B-mode imaging, spectral Doppler, color Doppler, and power Doppler as needed of all accessible portions of each vessel. Bilateral testing is  considered an integral part of a complete examination. Limited examinations for reoccurring indications may be performed as noted.  Right Findings: +----------+------------+---------+-----------+----------+-------+ RIGHT     CompressiblePhasicitySpontaneousPropertiesSummary +----------+------------+---------+-----------+----------+-------+ Subclavian    Full       Yes       Yes                      +----------+------------+---------+-----------+----------+-------+  Left Findings: +----------+------------+---------+-----------+----------+-------+ LEFT      CompressiblePhasicitySpontaneousPropertiesSummary +----------+------------+---------+-----------+----------+-------+ IJV           Full       Yes       Yes                      +----------+------------+---------+-----------+----------+-------+ Subclavian    Full       Yes       Yes                      +----------+------------+---------+-----------+----------+-------+ Axillary      Full       Yes       Yes                      +----------+------------+---------+-----------+----------+-------+ Brachial      Full       Yes       Yes                      +----------+------------+---------+-----------+----------+-------+ Radial        Full                                          +----------+------------+---------+-----------+----------+-------+ Ulnar         Full                                          +----------+------------+---------+-----------+----------+-------+ Cephalic      Full                                           +----------+------------+---------+-----------+----------+-------+ Basilic       Full                                          +----------+------------+---------+-----------+----------+-------+  Summary:  Right: No evidence of thrombosis in the subclavian.  Left: No evidence of deep vein thrombosis in the upper extremity. No evidence of superficial vein thrombosis in the upper extremity. No evidence of thrombosis in the subclavian.  *See table(s) above for measurements and observations.    Preliminary      PHYSICAL EXAM  Temp:  [97.6 F (36.4 C)-98.6 F (37 C)] 98.1 F (36.7 C) (07/01 1527) Pulse Rate:  [75-88] 78 (07/01 1527) Resp:  [16-20] 18 (07/01 1527) BP: (131-163)/(67-85) 154/84 (07/01 1527) SpO2:  [95 %-97 %] 95 % (07/01 1527)  General - Well nourished, well developed, in no apparent distress.  Ophthalmologic - fundi not visualized due to noncooperation.  Cardiovascular - Regular rhythm and rate.  Mental Status -  Level of arousal and orientation to  time, place, and person were intact. Language including expression, naming, repetition, comprehension was assessed and found intact.  Mild dysarthria  Cranial Nerves II - XII - II - Visual field intact OU. III, IV, VI - Extraocular movements intact. V - Facial sensation intact bilaterally. VII - mild right facial droop VIII - Hearing & vestibular intact bilaterally. X - Palate elevates symmetrically.  Mild dysarthria XI - Chin turning & shoulder shrug intact bilaterally. XII - Tongue protrusion intact.  Motor Strength - The patient's LUE with elbow and wrist chronic contracture, finger grip 3/5. LLE with knee and ankle chronic contracture, 0/5 movement. RUE 4/5 proximal and distal, RLE 3/5 proximal and distal. Motor Tone - Muscle tone was assessed at the neck and appendages and was increase on the LUE and LLE with contracture  Reflexes - The patient's reflexes were symmetrical in RUE and RLE and he had no pathological  reflexes.  Sensory - Light touch, temperature/pinprick were assessed and were symmetrical.    Coordination - The patient had normal movements in the right hand with no ataxia or dysmetria.  Tremor was absent.  Gait and Station - deferred.   ASSESSMENT/PLAN [name] is a 80 y.o. male with history of hypertension, hyperlipidemia, diabetes, stroke in 2007 admitted for right hand weakness, right arm and leg weakness. No tPA given due to outside window.    Stroke:  left CR infarct likely secondary to small vessel disease source CT head no acute abnormality MRI left CR infarct.  Chronic right thalamic infarct MRA head and neck unremarkable CTA head and neck likely severe bilateral proximal carotid canal narrowing at the skull base, moderate left M1 stenosis, mild to moderate narrowing of the cavernous and paraclinoid ICAs.  Bilateral carotid bulb atherosclerosis. 2D Echo EF 60 to 65% LDL 94 HgbA1c 5.6 UDS negative Lovenox for VTE prophylaxis dipyridamole SR 250 mg/aspirin 25 mg twice a day prior to admission, now on aspirin 81 mg daily and clopidogrel 75 mg daily DAPT for 3 weeks and then plavix alone.  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 Leonie Man at [facility] On Aggrenox and Zocor 20 for stroke prevention  Intracranial stenosis MRA head and neck no significant finding However, CTA head and neck showed likely severe bilateral proximal carotid canal narrowing at the skull base, moderate left M1 stenosis, mild to moderate narrowing of the cavernous and paraclinoid ICAs.  Bilateral carotid bulb atherosclerosis. On aspirin and Plavix DVT for 3 weeks and then Plavix alone. Avoid low BP Long-term BP goal 130-150  Diabetes HgbA1c  5.6 goal < 7.0 Controlled CBG monitoring SSI DM education and close PCP follow up  Hypertension Stable Permissive hypertension (OK if <220/120) for 24-48 hours post stroke and then gradually normalized within 2-3 days. Long term BP goal 130-150 given intracranial stenosis  Hyperlipidemia Home meds: Zocor 20 LDL 94, goal < 70 Now on Lipitor 40 Continue statin at discharge  Left sigmoid sinus and IJ not showing up on CTA, likely artifact CT head and neck showed left sigmoid sinus and IJ no opacification However MRI with contrast showed patent left sigmoid sinus and IJ Venous ultrasound of left upper extremity showed patent left IJ Likely imaging saw on CTA to be artifact  Other Stroke Risk Factors Advanced age  Other Active Problems Nursing home resident  CTA neck showed rounded opacity in the lateral left midlung, recommend chest CT to further categorize - per primary team.  Hospital day # 0  Neurology will sign off. Please call with questions. Pt will follow up with Dr. Leonie Man at [facility] in about 4 weeks. Thanks for the consult.   Rosalin Hawking, MD PhD Stroke Neurology 11/19/2020 3:30 PM    To contact Stroke Continuity provider, please refer to [email or web address] After hours, contact General Neurology

## 2020-11-19 NOTE — Progress Notes (Signed)
Subjective: Patient says he is not feeling any different with regards to his weakness symptoms since admission. Says care team came in at the wrong time to finish watching a show on the TV but he cooperated with team. Reports urinary problems due to BPH and has been seeing a doctor. Objective: Vital signs in last 24 hours: Vitals:   11/18/20 2315 11/19/20 0100 11/19/20 0327 11/19/20 0746  BP: (!) 161/78 (!) 150/82 (!) 163/83 (!) 154/78  Pulse: 82 78 76 75  Resp: 20 18 17 18   Temp: 97.6 F (36.4 C) 98.5 F (36.9 C) 98.1 F (36.7 C) 98.3 F (36.8 C)  TempSrc: Oral Oral Oral Oral  SpO2: 96% 97% 96%   Weight:      Height:       Weight change:   Intake/Output Summary (Last 24 hours) at 11/19/2020 1318 Last data filed at 11/18/2020 2310 Gross per 24 hour  Intake 150 ml  Output --  Net 150 ml   Lab Results: CBC Latest Ref Rng & Units 11/19/2020 11/18/2020 12/02/2019  WBC 4.0 - 10.5 K/uL 4.6 9.1 4.8  Hemoglobin 13.0 - 17.0 g/dL 13.3 14.5 14.9  Hematocrit 39.0 - 52.0 % 40.0 43.2 45.0  Platelets 150 - 400 K/uL 136(L) 160 163      CMP Latest Ref Rng & Units 11/19/2020 11/18/2020 12/02/2019  Glucose 70 - 99 mg/dL 84 108(H) 84  BUN 8 - 23 mg/dL 16 20 16   Creatinine 0.61 - 1.24 mg/dL 0.89 1.03 0.80  Sodium 135 - 145 mmol/L 138 140 141  Potassium 3.5 - 5.1 mmol/L 4.3 3.8 4.2  Chloride 98 - 111 mmol/L 107 106 105  CO2 22 - 32 mmol/L 25 24 24   Calcium 8.9 - 10.3 mg/dL 8.3(L) 9.2 9.4  Total Protein 6.5 - 8.1 g/dL - 6.4(L) 6.5  Total Bilirubin 0.3 - 1.2 mg/dL - 0.4 0.4  Alkaline Phos 38 - 126 U/L - 81 102  AST 15 - 41 U/L - 23 22  ALT 0 - 44 U/L - 18 16   Micro Results:  Medications:  Scheduled Meds:  atorvastatin  40 mg Oral Daily   baclofen  10 mg Oral QID   calcium-vitamin D  1 tablet Oral BID   darifenacin  7.5 mg Oral Daily   Dextromethorphan-quiNIDine  1 capsule Oral Daily   dipyridamole-aspirin  1 capsule Oral BID   enoxaparin (LOVENOX) injection  40 mg Subcutaneous Q24H    gabapentin  100 mg Oral TID   mirabegron ER  50 mg Oral Daily   pantoprazole  40 mg Oral Daily   polyethylene glycol  17 g Oral Daily   senna-docusate  1 tablet Oral QHS   sertraline  50 mg Oral Daily   tamsulosin  0.4 mg Oral QHS   Continuous Infusions: PRN Meds:.acetaminophen **OR** acetaminophen (TYLENOL) oral liquid 160 mg/5 mL **OR** acetaminophen  Exam General: Pleasant middle-age male laying in bed diagonally with obvious left sided hemiplegia and contracture of LUE. Watching TV Head: Normocephalic. Atraumatic. CV: RRR. No murmurs, rubs, or gallops. No LE edema Pulmonary: Lungs CTAB. Normal effort. No wheezing or rales. Abdominal: Soft, nontender, nondistended. Normal bowel sounds. Extremities: Palpable pulses. Normal ROM. Skin: Warm and dry. No obvious rash or lesions. Neuro:  Mental Status: Alert and oriented x 3.  Speech/Language: speech is without dysarthria or aphasia. Cranial Nerves: II: PERRL  3 mm/brisk. visual fields full. III, IV, VI: EOMI. Lid elevation symmetric and full. V: sensation is intact and symmetrical to  face. VII: Smile is symmetrical. VIII:hearing intact to voice. IX, X: palate elevation is symmetric. Phonation normal. XI: normal sternocleidomastoid and trapezius muscle strength. OMB:TDHRCB is symmetrical without fasciculations.   Motor: RUE: grips  4/5       triceps 4/5     biceps  4/5      LUE: grips  0/5      triceps  0/5      biceps   0/5 RLE:  knee  5/5  thigh  3+/5   plantar flexion  4+/5  dorsiflexion   4+/5 LLE:  knee  0/5   thigh  0/5     plantar flexion   0/5  dorsiflexion   0/5 Tone is rigid with noted contractures to LU/LLE.  Sensation- Intact to light touch bilaterally in all four extremities. Extinction absent to light touch to DSS. Coordination: Unable to perform bilateral FNF Psych: Normal mood and affect  Assessment/Plan:  Gregory Kerr is a 80 y.o. male with a PMHx of  CAD, HLD, HTN , BPH and stroke with residual left spastic  hemiplegia who was admitted for new onset  right sided weakness with MRI findings consistent with acute CVA. Also found to have a UTI  Active Problems:   CVA (cerebral vascular accident) ([place])  Right sided weakness due to acute CVA Presented with new right sided weakness and MRI head revealed acute infarct of centrum semiovale. Now undergoing medical optimization for secondary stroke prevention. Neurology and stroke team following, appreciate recs. -Continue atorvastatin 40 mg PO daily, target LDL < 70 -continue home agrenox  -CTA head and neck. No large vessel pathology. Mild atherosclerotic narrowing of the left vertebral artery origin. Bilateral carotid bifurcation atherosclerosis with approximately 30-40% stenosis on the right and 30% stenosis on the left. --echo completed with normal results, EF 60-65 % - -PT/OT  UTI Dirty UA, urine culture result pending. Patient reports no dysuria but endorsed bladder control issues. Patient  lives in a nursing facility and is dependent on assistance with ADLs, would treat as symptomatic UTI. - bactrim 800/160 mg PO BID x 3 days -Bladder scan   Lung Opacity  possible Aspiration Pneumonitis CTA shows rounded opacity in the lateral left mid lung. Dilated esophagus with residue also noted . Lung opacity my be due to aspiration pneumonitis or due to another pathology.  -f/u dedicated CT chest to better characterize lung opacity.  CAD  HTN Hx of CAD. Prior stress test in 2015 w/o significant findings. On nitroglycerin, metoprolol, Aggrenox, simvastatin. BP 638G systolic. Resume home BP meds given he has passed the window for permissive hypertension in the setting of acute CVA. - resume home bp meds  - C/w home statin, anti-platelet  BPH Bladder scan to check post-void residual volume in setting of UTI -Continue home tamsulosin, solifenacin, mirabegron -Bladder Scan, consider in and out cath if residual volume high  Disposition: Anticipate  discharge after stroke work up in 1-2 days.  This is a Careers information officer Note.  The care of the patient was discussed with Dr. Velna Ochs, MD and the assessment and plan formulated with their assistance.  Please see their attached note for official documentation of the daily encounter.   LOS: 0 days   AsempahCatalina Pizza, Medical Student 11/19/2020, 1:18 PM

## 2020-11-19 NOTE — NC FL2 (Signed)
[place] LEVEL OF CARE SCREENING TOOL     IDENTIFICATION  Patient Name: Gregory Kerr Birthdate: July 20, 1940 Sex: male Admission Date (Current Location): 11/18/2020  [facility] and [place] Number:  Herbalist and Address:  [facility] [place] [address] [place], [place] [address]      Provider Number: [id number]  Attending Physician Name and Address:  Velna Ochs, MD  Relative Name and Phone Number:       Current Level of Care: Hospital Recommended Level of Care: [place] Prior Approval Number:    Date Approved/Denied:   PASRR Number:    Discharge Plan: SNF    Current Diagnoses: Patient Active Problem List   Diagnosis Date Noted   Allergic rhinitis due to allergen 04/27/2016   Chest pain 02/27/2016   Thoracic ascending aortic aneurysm ([place]) 02/27/2016   Leukocytosis 02/27/2016   Major depression, chronic 02/16/2016   Neuropathic pain 02/16/2016   D (diarrhea) 12/29/2015   Coronary artery disease involving native coronary artery of native heart with angina pectoris ([place]) 11/12/2015   History of CVA with residual deficit 10/14/2015   Hyperlipidemia 07/16/2015   Hypertensive heart disease with congestive heart failure ([place]) 12/10/2014   Angina pectoris ([place]) 12/10/2014   Chronic obstructive pulmonary disease ([place]) 12/10/2014   Irritable bowel syndrome with diarrhea 12/10/2014   Senile osteoporosis 12/10/2014   CVA (cerebral vascular accident) ([place]) 12/10/2014   Acute on chronic diastolic CHF (congestive heart failure) ([place]) 09/27/2014   Hypokalemia 09/23/2014   Pancreatitis, gallstone 09/22/2014   Chronic diastolic heart failure ([place]) 09/22/2014   Acute cholangitis 09/22/2014   Gallstone pancreatitis 09/22/2014   Hiatal hernia with GERD 08/31/2014   Vitamin D deficiency 08/03/2014   Hyperlipidemia LDL goal <100 08/03/2014   CVA, old, hemiparesis ([place]) 08/03/2014   Leg edema, left 08/03/2014    Gastroesophageal reflux disease without esophagitis 06/18/2014   Essential hypertension, benign 06/18/2014   Primary generalized hypertrophic osteoarthrosis 06/18/2014   Left spastic hemiplegia ([place]) 06/18/2014   UTI (urinary tract infection) 04/04/2014   Essential hypertension 04/03/2014   Chronic airway obstruction, not elsewhere classified 01/15/2014   Dermatophytosis of nail 12/18/2013   Unspecified essential hypertension 12/18/2013   Generalized osteoarthrosis, unspecified site 12/18/2013   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   GERD (gastroesophageal reflux disease) 08/11/2013   Disuse osteoporosis 08/11/2013   Chronic pain syndrome 01/30/2013   IBS (irritable bowel syndrome) 01/30/2013   Cerebral infarct ([place]) 10/24/2012   HTN (hypertension) 10/24/2012   Osteoporosis 10/24/2012   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   Other and unspecified hyperlipidemia 10/24/2012   Hiatal hernia 10/24/2012    Orientation RESPIRATION BLADDER Height & Weight     Self, Time, Situation, Place  Normal Incontinent Weight: 165 lb (74.8 kg) Height:  5\' 9"  (175.3 cm)  BEHAVIORAL SYMPTOMS/MOOD NEUROLOGICAL BOWEL NUTRITION STATUS      Incontinent Diet (regular)  AMBULATORY STATUS COMMUNICATION OF NEEDS Skin   Extensive Assist Verbally Normal                       Personal Care Assistance Level of Assistance  Bathing, Feeding, Dressing Bathing Assistance: Maximum assistance Feeding assistance: Maximum assistance Dressing Assistance: Maximum assistance     Functional Limitations Info             SPECIAL CARE FACTORS FREQUENCY  PT (By licensed PT), OT (By licensed OT)     PT Frequency: 5x/wk OT Frequency: 5x/wk  Contractures Contractures Info: Not present    Additional Factors Info  Code Status, Allergies, Psychotropic Code Status Info: Full Allergies Info: Penicillins Psychotropic Info: Zoloft 50mg  daily         Current  Medications (11/19/2020):  This is the current hospital active medication list Current Facility-Administered Medications  Medication Dose Route Frequency Provider Last Rate Last Admin   acetaminophen (TYLENOL) tablet 650 mg  650 mg Oral Q4H PRN Mosetta Anis, MD   650 mg at 11/19/20 1424   Or   acetaminophen (TYLENOL) 160 MG/5ML solution 650 mg  650 mg Per Tube Q4H PRN Mosetta Anis, MD       Or   acetaminophen (TYLENOL) suppository 650 mg  650 mg Rectal Q4H PRN Mosetta Anis, MD       [START ON 11/20/2020] aspirin EC tablet 81 mg  81 mg Oral Daily Rosalin Hawking, MD       atorvastatin (LIPITOR) tablet 40 mg  40 mg Oral Daily Mosetta Anis, MD   40 mg at 11/19/20 1048   baclofen (LIORESAL) tablet 10 mg  10 mg Oral QID Mosetta Anis, MD   10 mg at 11/19/20 1423   calcium-vitamin D (OSCAL WITH D) 500-200 MG-UNIT per tablet 1 tablet  1 tablet Oral BID Mosetta Anis, MD   1 tablet at 11/19/20 1048   clopidogrel (PLAVIX) tablet 75 mg  75 mg Oral Daily Rosalin Hawking, MD       darifenacin (ENABLEX) 24 hr tablet 7.5 mg  7.5 mg Oral Daily Mosetta Anis, MD   7.5 mg at 11/19/20 1047   Dextromethorphan-quiNIDine (NUEDEXTA) 20-10 MG per capsule 1 capsule  1 capsule Oral Daily Mosetta Anis, MD   1 capsule at 11/19/20 1047   enoxaparin (LOVENOX) injection 40 mg  40 mg Subcutaneous Q24H Mosetta Anis, MD   40 mg at 11/18/20 2327   gabapentin (NEURONTIN) capsule 100 mg  100 mg Oral TID Mosetta Anis, MD   100 mg at 11/19/20 1047   mirabegron ER (MYRBETRIQ) tablet 50 mg  50 mg Oral Daily Mosetta Anis, MD   50 mg at 11/19/20 1047   pantoprazole (PROTONIX) EC tablet 40 mg  40 mg Oral Daily Mosetta Anis, MD   40 mg at 11/19/20 1048   polyethylene glycol (MIRALAX / GLYCOLAX) packet 17 g  17 g Oral Daily Mosetta Anis, MD       senna-docusate (Senokot-S) tablet 1 tablet  1 tablet Oral QHS Mosetta Anis, MD       sertraline (ZOLOFT) tablet 50 mg  50 mg Oral Daily Mosetta Anis, MD   50 mg at 11/19/20 1048   tamsulosin  (FLOMAX) capsule 0.4 mg  0.4 mg Oral QHS Mosetta Anis, MD   0.4 mg at 11/18/20 2327     Discharge Medications: Please see discharge summary for a list of discharge medications.  Relevant Imaging Results:  Relevant Lab Results:   Additional Information SS#: [id number]  Geralynn Ochs, LCSW

## 2020-11-19 NOTE — TOC Initial Note (Signed)
Transition of Care [facility]) - Initial/Assessment Note    Patient Details  Name: Gregory Kerr MRN: [id number] Date of Birth: Aug 24, 1940  Transition of [facility]) CM/SW Contact:    Geralynn Ochs, LCSW Phone Number: 11/19/2020, 4:05 PM  Clinical Narrative:        CSW met with patient to confirm plan to return to [facility]. CSW discussed with patient his baseline mobility status, and he is weaker than his baseline, would like to try to get approval for rehab upon return to [place]. CSW requested submission for SNF insurance authorization. CSW to continue to follow.           Expected Discharge Plan: [place] Barriers to Discharge: Continued Medical Work up, Ship broker   Patient Goals and CMS Choice Patient states their goals for this hospitalization and ongoing recovery are:: to get back to [place] with therapy CMS Medicare.gov Compare Post Acute Care list provided to:: Patient Choice offered to / list presented to : Patient  Expected Discharge Plan and Services Expected Discharge Plan: [place] Choice: [place] Living arrangements for the past 2 months: [address]                                      Prior Living Arrangements/Services Living arrangements for the past 2 months: [place] Lives with:: Facility Resident Patient language and need for interpreter reviewed:: No Do you feel safe going back to the place where you live?: Yes      Need for Family Participation in Patient Care: No (Comment) Care giver support system in place?: Yes (comment)   Criminal Activity/Legal Involvement Pertinent to Current Situation/Hospitalization: No - Comment as needed  Activities of Daily Living      Permission Sought/Granted Permission sought to share information with : Facility Art therapist granted to share information with : Yes, Verbal Permission  Granted     Permission granted to share info w AGENCY: [facility]        Emotional Assessment Appearance:: Appears stated age Attitude/Demeanor/Rapport: Engaged Affect (typically observed): Appropriate Orientation: : Oriented to Self, Oriented to Place, Oriented to  Time, Oriented to Situation Alcohol / Substance Use: Not Applicable Psych Involvement: No (comment)  Admission diagnosis:  Weakness [R53.1] CVA (cerebral vascular accident) [facility]) [I63.9] Patient Active Problem List   Diagnosis Date Noted   Allergic rhinitis due to allergen 04/27/2016   Chest pain 02/27/2016   Thoracic ascending aortic aneurysm ([place]) 02/27/2016   Leukocytosis 02/27/2016   Major depression, chronic 02/16/2016   Neuropathic pain 02/16/2016   D (diarrhea) 12/29/2015   Coronary artery disease involving native coronary artery of native heart with angina pectoris ([place]) 11/12/2015   History of CVA with residual deficit 10/14/2015   Hyperlipidemia 07/16/2015   Hypertensive heart disease with congestive heart failure ([place]) 12/10/2014   Angina pectoris ([place]) 12/10/2014   Chronic obstructive pulmonary disease ([place]) 12/10/2014   Irritable bowel syndrome with diarrhea 12/10/2014   Senile osteoporosis 12/10/2014   CVA (cerebral vascular accident) ([place]) 12/10/2014   Acute on chronic diastolic CHF (congestive heart failure) ([place]) 09/27/2014   Hypokalemia 09/23/2014   Pancreatitis, gallstone 09/22/2014   Chronic diastolic heart failure ([place]) 09/22/2014   Acute cholangitis 09/22/2014   Gallstone pancreatitis 09/22/2014   Hiatal hernia with GERD 08/31/2014   Vitamin D deficiency 08/03/2014   Hyperlipidemia  LDL goal <100 08/03/2014   CVA, old, hemiparesis ([place]) 08/03/2014   Leg edema, left 08/03/2014   Gastroesophageal reflux disease without esophagitis 06/18/2014   Essential hypertension, benign 06/18/2014   Primary generalized hypertrophic osteoarthrosis 06/18/2014   Left spastic hemiplegia ([place]) 06/18/2014    UTI (urinary tract infection) 04/04/2014   Essential hypertension 04/03/2014   Chronic airway obstruction, not elsewhere classified 01/15/2014   Dermatophytosis of nail 12/18/2013   Unspecified essential hypertension 12/18/2013   Generalized osteoarthrosis, unspecified site 12/18/2013   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   GERD (gastroesophageal reflux disease) 08/11/2013   Disuse osteoporosis 08/11/2013   Chronic pain syndrome 01/30/2013   IBS (irritable bowel syndrome) 01/30/2013   Cerebral infarct ([place]) 10/24/2012   HTN (hypertension) 10/24/2012   Osteoporosis 10/24/2012   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   Other and unspecified hyperlipidemia 10/24/2012   Hiatal hernia 10/24/2012   PCP:  Virgel Bouquet, MD Pharmacy:  No Pharmacies Listed    Social Determinants of Health (SDOH) Interventions    Readmission Risk Interventions No flowsheet data found.

## 2020-11-19 NOTE — Evaluation (Signed)
Occupational Therapy Evaluation Patient Details Name: Gregory Kerr MRN: [id number] DOB: 1940/09/14 Today's Date: 11/19/2020    History of Present Illness Pt is a 80 y.o. male admitted 6/30 with new onset R side weakness. MRI revealed small focus of acute ischemia within the left centrum semiovale.  PMH: R thalamic infarction (2007) with L spastic hemiplegia, BPH, HTN, HLD, depression, and borderline DM.   Clinical Impression   Patient admitted for the diagnosis above.  Unfortunately the current CVA has impacted his prior strong side.  PTA he lived in the ALF side of a facility.  He was able to transfer himself from surface to surface, but it did not sound safe.  In addition, he could perform sponge baths, but needed assist with showers.  Barriers are listed below.  Currently he is needing near dependent care with all mobility and aspects of self care.  OT provided bendable utensils from him, and informed staff how to manipulate them to his liking.  OT will follow him in the acute setting, but SNF will be recommended for most likely long term care.      Follow Up Recommendations  SNF    Equipment Recommendations  None recommended by OT    Recommendations for Other Services       Precautions / Restrictions Precautions Precautions: Fall;Other (comment) Precaution Comments: spastic hemiplegia LUE/LE Restrictions Weight Bearing Restrictions: No      Mobility Bed Mobility Overal bed mobility: Needs Assistance Bed Mobility: Rolling Rolling: Max assist;+2 for physical assistance   Supine to sit: HOB elevated;+2 for physical assistance;Max assist Sit to supine: +2 for physical assistance;Max assist   General bed mobility comments: heavy push posteriorly, difficulty sequencing/motor planning, assist with all aspects of mobility Patient Response: Cooperative  Transfers                 General transfer comment: unable to safely transition beyond EOB, will initially need  maximove    Balance Overall balance assessment: Needs assistance Sitting-balance support: Single extremity supported;Feet supported Sitting balance-Leahy Scale: Zero Sitting balance - Comments: max assist to maintain sitting balance EOB                                   ADL either performed or assessed with clinical judgement   ADL Overall ADL's : Needs assistance/impaired Eating/Feeding: Total assistance;Bed level   Grooming: Total assistance;Bed level   Upper Body Bathing: Total assistance;Bed level   Lower Body Bathing: Total assistance;Bed level   Upper Body Dressing : Total assistance;Bed level   Lower Body Dressing: Total assistance;Bed level   Toilet Transfer: Total assistance   Toileting- Clothing Manipulation and Hygiene: Total assistance;Bed level               Vision Patient Visual Report: No change from baseline       Perception     Praxis      Pertinent Vitals/Pain Pain Assessment: Faces Faces Pain Scale: Hurts a little bit Pain Location: LUE/LE Pain Descriptors / Indicators: Spasm Pain Intervention(s): Monitored during session     Hand Dominance Right   Extremity/Trunk Assessment Upper Extremity Assessment Upper Extremity Assessment: RUE deficits/detail;LUE deficits/detail RUE Deficits / Details: Generalized weakness RUE Sensation: WNL RUE Coordination: WNL LUE Deficits / Details: flexion contractures LUE: Unable to fully assess due to immobilization LUE Sensation: WNL LUE Coordination: decreased fine motor;decreased gross motor   Lower Extremity Assessment Lower Extremity Assessment: Defer  to PT evaluation RLE Deficits / Details: 3/5 grossly graded LLE Deficits / Details: spasticity in hip/knee extensors and hip adductors. Able to passively move through partial ROM. Fixed contracture L foot in PF   Cervical / Trunk Assessment Cervical / Trunk Assessment: Kyphotic   Communication Communication Communication: No  difficulties   Cognition Arousal/Alertness: Awake/alert Behavior During Therapy: WFL for tasks assessed/performed Overall Cognitive Status: Within Functional Limits for tasks assessed                                                      Home Living Family/patient expects to be discharged to:: [place]: Wheelchair - manual;Shower seat - built in;Grab bars - tub/shower;Grab bars - toilet          Prior Functioning/Environment Level of Independence: Needs assistance  Gait / Transfers Assistance Needed: Mod I at wheelchair level. ADL's / Homemaking Assistance Needed: reports mod I for basic ADLs from supine to sit level.  Increased time and effort.  Assist with showers from staff.            OT Problem List: Decreased strength;Decreased range of motion;Decreased activity tolerance;Impaired balance (sitting and/or standing);Decreased knowledge of use of DME or AE;Impaired tone;Impaired UE functional use;Pain      OT Treatment/Interventions: Self-care/ADL training;Neuromuscular education;DME and/or AE instruction;Balance training;Therapeutic activities    OT Goals(Current goals can be found in the care plan section) Acute Rehab OT Goals Patient Stated Goal: Hoping to sit up at some point OT Goal Formulation: With patient Time For Goal Achievement: 12/03/20 Potential to Achieve Goals: Good ADL Goals Pt Will Perform Eating: with min assist;sitting Pt Will Perform Grooming: with min assist;sitting Additional ADL Goal #1: Patient will roll to the L side with Mod A of one to increase indepedence with toileting and for caregiver support.  OT Frequency: Min 2X/week   Barriers to D/C:    none noted       Co-evaluation              AM-PAC OT "6 Clicks" Daily Activity     Outcome Measure Help from another person eating meals?: Total Help from another person taking care of personal  grooming?: Total Help from another person toileting, which includes using toliet, bedpan, or urinal?: Total Help from another person bathing (including washing, rinsing, drying)?: Total Help from another person to put on and taking off regular upper body clothing?: Total Help from another person to put on and taking off regular lower body clothing?: Total 6 Click Score: 6   End of Session Nurse Communication: Other (comment) (Bendable utensils.)  Activity Tolerance: Patient tolerated treatment well Patient left: in bed;with call bell/phone within reach;with nursing/sitter in room  OT Visit Diagnosis: Other abnormalities of gait and mobility (R26.89);Muscle weakness (generalized) (M62.81);Feeding difficulties (R63.3);Pain Pain - Right/Left: Left Pain - part of body: Leg                Time: 1335-1415 OT Time Calculation (min): 40 min Charges:  OT General Charges $OT Visit: 1 Visit OT Evaluation $OT Eval Moderate Complexity: 1 Mod OT Treatments $Self Care/Home Management : 23-37 mins  11/19/2020  [place], OTR/L  Acute Rehabilitation Services  Office:  [place] 11/19/2020, 3:38 PM

## 2020-11-19 NOTE — Progress Notes (Signed)
Upper extremity venous has been completed.   Preliminary results in CV Proc.   Abram Sander 11/19/2020 1:52 PM

## 2020-11-19 NOTE — Progress Notes (Signed)
  Echocardiogram 2D Echocardiogram has been performed.  Gregory Kerr M 11/19/2020, 9:49 AM

## 2020-11-20 DIAGNOSIS — R531 Weakness: Secondary | ICD-10-CM

## 2020-11-20 DIAGNOSIS — N3000 Acute cystitis without hematuria: Secondary | ICD-10-CM

## 2020-11-20 LAB — BASIC METABOLIC PANEL
Anion gap: 8 (ref 5–15)
BUN: 17 mg/dL (ref 8–23)
CO2: 24 mmol/L (ref 22–32)
Calcium: 8.8 mg/dL — ABNORMAL LOW (ref 8.9–10.3)
Chloride: 103 mmol/L (ref 98–111)
Creatinine, Ser: 0.78 mg/dL (ref 0.61–1.24)
GFR, Estimated: >60 mL/min (ref >60)
Glucose, Bld: 95 mg/dL (ref 70–99)
Potassium: 4 mmol/L (ref 3.5–5.1)
Sodium: 135 mmol/L (ref 135–145)

## 2020-11-20 MED ORDER — CIPROFLOXACIN HCL 500 MG PO TABS
250.0000 mg | ORAL_TABLET | Freq: Two times a day (BID) | ORAL | Status: DC
  Administered 2020-11-20 – 2020-11-21 (×3): 250 mg via ORAL
  Filled 2020-11-20 (×3): qty 1

## 2020-11-20 NOTE — TOC Progression Note (Signed)
Transition of Care [facility]) - Progression Note    Patient Details  Name: Gregory Kerr MRN: [id number] Date of Birth: Nov 22, 1940  Transition of Care [facility]) CM/SW Contact  Coralee Pesa, [place] Phone Number: 11/20/2020, 12:21 PM  Clinical Narrative:    CSW was notified by MD that pt could DC today. Pt's insurance authorization is still pending, it was started by the facility. Facility noted they were unsure if pt could come back with SNF auth pending. They did not feel comfortable accepting pt without approval from DON. They stated that the DON may be back tomorrow or early next week. CSW will continue to follow to see when pt can DC.   Expected Discharge Plan: [place] Barriers to Discharge: Continued Medical Work up, Ship broker  Expected Discharge Plan and Services Expected Discharge Plan: [place] Choice: [place] arrangements for the past 2 months: [place]                                       Social Determinants of Health (SDOH) Interventions    Readmission Risk Interventions No flowsheet data found.

## 2020-11-20 NOTE — Evaluation (Signed)
Speech Language Pathology Evaluation Patient Details Name: Gregory Kerr MRN: [id number] DOB: 04/05/1941 Today's Date: 11/20/2020 Time: 0277-4128 SLP Time Calculation (min) (ACUTE ONLY): 14 min  Problem List:  Patient Active Problem List   Diagnosis Date Noted   Acute CVA (cerebrovascular accident) ([place]) 11/19/2020   Allergic rhinitis due to allergen 04/27/2016   Chest pain 02/27/2016   Thoracic ascending aortic aneurysm ([place]) 02/27/2016   Leukocytosis 02/27/2016   Major depression, chronic 02/16/2016   Neuropathic pain 02/16/2016   D (diarrhea) 12/29/2015   Coronary artery disease involving native coronary artery of native heart with angina pectoris ([place]) 11/12/2015   History of CVA with residual deficit 10/14/2015   Hyperlipidemia 07/16/2015   Hypertensive heart disease with congestive heart failure ([place]) 12/10/2014   Angina pectoris ([place]) 12/10/2014   Chronic obstructive pulmonary disease ([place]) 12/10/2014   Irritable bowel syndrome with diarrhea 12/10/2014   Senile osteoporosis 12/10/2014   CVA (cerebral vascular accident) ([place]) 12/10/2014   Acute on chronic diastolic CHF (congestive heart failure) ([place]) 09/27/2014   Hypokalemia 09/23/2014   Pancreatitis, gallstone 09/22/2014   Chronic diastolic heart failure ([place]) 09/22/2014   Acute cholangitis 09/22/2014   Gallstone pancreatitis 09/22/2014   Hiatal hernia with GERD 08/31/2014   Vitamin D deficiency 08/03/2014   Hyperlipidemia LDL goal <100 08/03/2014   CVA, old, hemiparesis ([place]) 08/03/2014   Leg edema, left 08/03/2014   Gastroesophageal reflux disease without esophagitis 06/18/2014   Essential hypertension, benign 06/18/2014   Primary generalized hypertrophic osteoarthrosis 06/18/2014   Left spastic hemiplegia ([place]) 06/18/2014   UTI (urinary tract infection) 04/04/2014   Essential hypertension 04/03/2014   Chronic airway obstruction, not elsewhere classified 01/15/2014   Dermatophytosis of nail 12/18/2013    Unspecified essential hypertension 12/18/2013   Generalized osteoarthrosis, unspecified site 12/18/2013   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   GERD (gastroesophageal reflux disease) 08/11/2013   Disuse osteoporosis 08/11/2013   Chronic pain syndrome 01/30/2013   IBS (irritable bowel syndrome) 01/30/2013   Cerebral infarct ([place]) 10/24/2012   HTN (hypertension) 10/24/2012   Osteoporosis 10/24/2012   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   Other and unspecified hyperlipidemia 10/24/2012   Hiatal hernia 10/24/2012   Past Medical History:  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   Pneumonia    "once or twice" (09/22/2014)   Retinal detachment    left eye   Stroke [facility]) 2007   "left side doesn't work now" (09/22/2014)   Past Surgical History:  Past Surgical History:  Procedure Laterality Date   APPENDECTOMY  05/27/2009   Ronnald Collum, MD   CATARACT EXTRACTION W/ INTRAOCULAR LENS  IMPLANT, BILATERAL Bilateral ?2014   CHOLECYSTECTOMY N/A 09/26/2014   Procedure: LAPAROSCOPIC CHOLECYSTECTOMY WITH INTRAOPERATIVE CHOLANGIOGRAM;  Surgeon: Donnie Mesa, MD;  Location: [place];  Service: General;  Laterality: N/A;   COLONOSCOPY WITH PROPOFOL N/A 12/29/2015   Procedure: COLONOSCOPY WITH PROPOFOL;  Surgeon: Wilford Corner, MD;  Location[facility] ENDOSCOPY;  Service: Endoscopy;  Laterality: N/A;   ERCP N/A 09/25/2014   Procedure: ENDOSCOPIC RETROGRADE CHOLANGIOPANCREATOGRAPHY (ERCP);  Surgeon: Clarene Essex, MD;  Location: [facility] ENDOSCOPY;  Service: Endoscopy;  Laterality: N/A;   PARS PLANA VITRECTOMY Left 01/30/2019   Procedure:  PARS PLANA VITRECTOMY WITH 25 GAUGE;  Surgeon: Sherlynn Stalls, MD;   Location: [place];  Service: Ophthalmology;  Laterality: Left;   PARS PLANA VITRECTOMY Right 11/13/2019   Procedure: PARS PLANA VITRECTOMY WITH 25 GAUGE WITH REMOVAL OF POSTERIOR CAPSULAR OPACIFICATION WITH VITRECTOR;  Surgeon: Sherlynn Stalls, MD;  Location: [place];  Service: Ophthalmology;  Laterality: Right;   PHOTOCOAGULATION WITH LASER Left 01/30/2019   Procedure: PHOTOCOAGULATION WITH LASER;  Surgeon: Sherlynn Stalls, MD;  Location: [place];  Service: Ophthalmology;  Laterality: Left;   HPI:  80 yo male who presented to ED today after experiencing new right sided weakness yesterday at his ALF. Sequelae from old stroke is left spastic hemiplegia.MRI shows Small focus of acute ischemia within the left centrum semiovale.   Assessment / Plan / Recommendation Clinical Impression  Pt presents with grossly intact cognitive skills; he also reports this to be at baseline. Pt fully oriented and able to recall details of hospitalization. Pt has been a [facility] of [facility] for the past 15 years. Receptive and expressive language skills also appeared intact. Pt presents with dysarthria of speech with main component of deficit being poor breath support for speech. Pt would frequently run out of air during communication attempts and impair communication intelligibility at both word and phrase level. He states this has worsened since hospital admission. Pt an excellent candidate for respiratory muscle strength training as he is motivated and with fairly intact cognitive abilities. SLP to follow up for RMT intervention.    SLP Assessment  SLP Recommendation/Assessment: Patient needs continued Speech Lanaguage Pathology Services SLP Visit Diagnosis: Dysarthria and anarthria (R47.1)    Follow Up Recommendations  Skilled Nursing facility    Frequency and Duration min 2x/week  1 week      SLP Evaluation Cognition  Overall Cognitive Status: Within Functional Limits for tasks assessed Arousal/Alertness:  Awake/alert Orientation Level: Oriented X4 Attention: Focused;Sustained Focused Attention: Appears intact Sustained Attention: Appears intact Memory: Appears intact Awareness: Appears intact Problem Solving: Appears intact Executive Function: Self Monitoring;Organizing Organizing: Appears intact Self Monitoring: Appears intact Safety/Judgment: Appears intact       Comprehension  Auditory Comprehension Overall Auditory Comprehension: Appears within functional limits for tasks assessed    Expression Expression Primary Mode of Expression: Verbal Verbal Expression Overall Verbal Expression: Appears within functional limits for tasks assessed Written Expression Dominant Hand: Right   Oral / Motor  Oral Motor/Sensory Function Overall Oral Motor/Sensory Function: Within functional limits Motor Speech Overall Motor Speech: Impaired Respiration: Impaired Level of Impairment: Word Phonation: Low vocal intensity Resonance: Within functional limits Articulation: Within functional limitis Intelligibility: Intelligibility reduced Word: 75-100% accurate Phrase: 50-74% accurate Sentence: 50-74% accurate Conversation: 25-49% accurate Motor Planning: Witnin functional limits Effective Techniques: Increased vocal intensity (fully upright positioning)   GO                    Han Vejar H. MA, CCC-SLP Acute Rehabilitation Services   11/20/2020, 11:39 AM

## 2020-11-20 NOTE — Progress Notes (Signed)
Subjective: Patient says he slept well. Has had not significant improvement in right extremities weakness symptoms since admission. Says he is ready to go back to [facility] to continue working with physical therapy. Objective: Vital signs in last 24 hours: Vitals:   11/19/20 2345 11/20/20 0414 11/20/20 0853 11/20/20 1242  BP: 137/66 (!) 153/75 (!) 166/88 (!) 157/91  Pulse: 72 69 77 82  Resp: 17 16 20 18   Temp: 98.2 F (36.8 C) 97.8 F (36.6 C) 97.8 F (36.6 C) 98.8 F (37.1 C)  TempSrc: Oral Oral Oral Oral  SpO2: 98% 98% 94% 96%  Weight:      Height:       Weight change:   Intake/Output Summary (Last 24 hours) at 11/20/2020 1457 Last data filed at 11/20/2020 0500 Gross per 24 hour  Intake --  Output 500 ml  Net -500 ml   Lab Results: CBC Latest Ref Rng & Units 11/19/2020 11/19/2020 11/18/2020  WBC 4.0 - 10.5 K/uL 5.2 4.6 9.1  Hemoglobin 13.0 - 17.0 g/dL 13.7 13.3 14.5  Hematocrit 39.0 - 52.0 % 41.5 40.0 43.2  Platelets 150 - 400 K/uL 148(L) 136(L) 160      CMP Latest Ref Rng & Units 11/20/2020 11/19/2020 11/18/2020  Glucose 70 - 99 mg/dL 95 84 108(H)  BUN 8 - 23 mg/dL 17 16 20   Creatinine 0.61 - 1.24 mg/dL 0.78 0.89 1.03  Sodium 135 - 145 mmol/L 135 138 140  Potassium 3.5 - 5.1 mmol/L 4.0 4.3 3.8  Chloride 98 - 111 mmol/L 103 107 106  CO2 22 - 32 mmol/L 24 25 24   Calcium 8.9 - 10.3 mg/dL 8.8(L) 8.3(L) 9.2  Total Protein 6.5 - 8.1 g/dL - - 6.4(L)  Total Bilirubin 0.3 - 1.2 mg/dL - - 0.4  Alkaline Phos 38 - 126 U/L - - 81  AST 15 - 41 U/L - - 23  ALT 0 - 44 U/L - - 18   Micro Results:  Medications:  Scheduled Meds:  aspirin EC  81 mg Oral Daily   atorvastatin  40 mg Oral Daily   baclofen  10 mg Oral QID   calcium-vitamin D  1 tablet Oral BID   clopidogrel  75 mg Oral Daily   darifenacin  7.5 mg Oral Daily   Dextromethorphan-quiNIDine  1 capsule Oral Daily   enoxaparin (LOVENOX) injection  40 mg Subcutaneous Q24H   gabapentin  100 mg Oral TID   mirabegron ER  50 mg  Oral Daily   pantoprazole  40 mg Oral Daily   polyethylene glycol  17 g Oral Daily   senna-docusate  1 tablet Oral QHS   sertraline  50 mg Oral Daily   sulfamethoxazole-trimethoprim  1 tablet Oral Q12H   tamsulosin  0.4 mg Oral QHS   Continuous Infusions: PRN Meds:.acetaminophen **OR** acetaminophen (TYLENOL) oral liquid 160 mg/5 mL **OR** acetaminophen  Exam General: Pleasant middle-age male laying in bed diagonally with obvious left sided hemiplegia and contracture of LUE. Watching TV Head: Normocephalic. Atraumatic. CV: RRR. No murmurs, rubs, or gallops. No LE edema Pulmonary: Lungs CTAB. Normal effort. No wheezing or rales. Abdominal: Soft, nontender, nondistended. Normal bowel sounds. Extremities: Palpable pulses. Normal ROM. Skin: Warm and dry. No obvious rash or lesions. Neuro:  Mental Status: Alert and oriented x 3.  Speech/Language: speech is without dysarthria or aphasia. Cranial Nerves: II: PERRL  3 mm/brisk. visual fields full. III, IV, VI: EOMI. Lid elevation symmetric and full. V: sensation is intact and symmetrical to face.  VII: Smile is symmetrical. VIII:hearing intact to voice. IX, X: palate elevation is symmetric. Phonation normal. XI: normal sternocleidomastoid and trapezius muscle strength. QVZ:DGLOVF is symmetrical without fasciculations.   Motor: RUE: grips  4/5       triceps 4/5     biceps  4/5      LUE: grips  0/5      triceps  0/5      biceps   0/5 RLE:  knee  5/5  thigh  3+/5   plantar flexion  4+/5  dorsiflexion   4+/5 LLE:  knee  0/5   thigh  0/5     plantar flexion   0/5  dorsiflexion   0/5 Tone is rigid with noted contractures to LU/LLE.  Sensation- Intact to light touch bilaterally in all four extremities. Extinction absent to light touch to DSS. Coordination: Unable to perform bilateral FNF Psych: Normal mood and affect  Assessment/Plan:  Gregory Kerr is a 80 y.o. male with a PMHx of  CAD, HLD, HTN , BPH and stroke with residual left spastic  hemiplegia who was admitted for new onset  right sided weakness with MRI findings consistent with acute CVA. Also found to have a UTI. He is status post stroke work-up and medical optimization for secondary stroke prevention, now awaiting upgrade to SNF status at [facility].   Active Problems:   CVA (cerebral vascular accident) ([place])   Acute CVA (cerebrovascular accident) ([place])  Right sided weakness due to acute CVA Presented with new right sided weakness and MRI head revealed acute infarct of centrum semiovale. S/p stroke work up and medical optimization for secondary prevention of stroke.  Most recent A1c is 5.6. Echocardiogram completed-left ventricular EF of 64-33%, grade 2 diastolic dysfunction, and mildly reduced right ventricular systolic function. No evidence of thrombus. CTA head and neck. No large vessel pathology. Mild atherosclerotic narrowing of the left vertebral artery origin. Bilateral carotid bifurcation atherosclerosis with approximately 30-40% stenosis on the right and 30% stenosis on the left. Upper extremity venous Doppler without evidence of thrombosis in the subclavian, superficial or deep veins. Medications optimized by neurology. Appreciate their involvement. -Continue atorvastatin 40 mg PO daily, target LDL < 70 -Aspirin 81 mg daily and clopidogrel 75 mg daily DAPT for 3 weeks and then plavix alone.  -Long term BP goal 295-188 systolic  UTI Dirty UA, urine culture result pending. Patient reports no dysuria but endorsed bladder control issues. Patient  lives in a nursing facility and is dependent on assistance with ADLs, would treat as symptomatic UTI.  -Continue  bactrim 800/160 mg PO BID x 3 days  Lung Opacity  possible Aspiration Pneumonitis CTA shows rounded opacity in the lateral left mid lung. Dedicated CT chest shows large hiatal hernia containing much of the stomach, passive atelectatic changes adjacent the hiatal hernia in the left lung base and stable  ascending thoracic aortic dilatation to 4.3 cm. Lung opacity was likely due to chronic aspiration events given patulous esophagus with debris and large hiatal hernia. Speech evaluated patient.  CAD  HTN Hx of CAD. Prior stress test in 2015 w/o significant findings. On nitroglycerin, metoprolol, Aggrenox, simvastatin. BP 416S systolic during admission. Long term BP goal 130-150 given intracranial stenosis. -Continue Lipitor 40, target LDL <70  Disposition: Anticipate discharge after stroke work up in 1-2 days.  This is a Careers information officer Note.  The care of the patient was discussed with Dr. Velna Ochs, MD and the assessment and plan formulated with their assistance.  Please see their attached note for official documentation of the daily encounter.   LOS: 1 day   AsempahCatalina Pizza, Medical Student 11/20/2020, 2:57 PM

## 2020-11-21 LAB — URINE CULTURE: Culture: 70000 — AB

## 2020-11-21 MED ORDER — ASPIRIN 81 MG PO TBEC: 81.0000 mg | DELAYED_RELEASE_TABLET | Freq: Every day | ORAL | Status: AC

## 2020-11-21 MED ORDER — CIPROFLOXACIN HCL 250 MG PO TABS: 250.0000 mg | ORAL_TABLET | Freq: Two times a day (BID) | ORAL | 0 refills | Status: DC

## 2020-11-21 MED ORDER — TAMSULOSIN HCL 0.4 MG PO CAPS: 0.4000 mg | ORAL_CAPSULE | Freq: Every day | ORAL | 0 refills | Status: AC

## 2020-11-21 MED ORDER — CLOPIDOGREL BISULFATE 75 MG PO TABS: 75.0000 mg | ORAL_TABLET | Freq: Every day | ORAL | 0 refills | Status: DC

## 2020-11-21 MED ORDER — SOLIFENACIN SUCCINATE 5 MG PO TABS: 5.0000 mg | ORAL_TABLET | Freq: Every day | ORAL | 0 refills | Status: DC

## 2020-11-21 MED ORDER — CLOPIDOGREL BISULFATE 75 MG PO TABS: 75.0000 mg | ORAL_TABLET | Freq: Every day | ORAL | 0 refills | Status: AC

## 2020-11-21 MED ORDER — ASPIRIN 81 MG PO TBEC: 81.0000 mg | DELAYED_RELEASE_TABLET | Freq: Every day | ORAL | 11 refills | Status: DC

## 2020-11-21 MED ORDER — ATORVASTATIN CALCIUM 40 MG PO TABS: 40.0000 mg | ORAL_TABLET | Freq: Every day | ORAL | 0 refills | Status: DC

## 2020-11-21 NOTE — Progress Notes (Signed)
Patient left the unit with PTAR at 2120 pm to [facility]. Patient belongings send with patient.

## 2020-11-21 NOTE — Progress Notes (Signed)
    Subjective:  Overnight Events: No overnight events  No acute concerns this morning on exam.  Patient has not noticed any Gregory neurological deficits overnight.  Reports nursing is helping him with eating.  He is aware we are waiting discharge planning  Objective:  Vital signs in last 24 hours: Vitals:   11/20/20 2030 11/20/20 2306 11/21/20 0412 11/21/20 0832  BP:  (!) 154/91 (!) 143/76 (!) 161/81  Pulse: 88 76 73 75  Resp: 16 16 16 18   Temp: 98 F (36.7 C) (!) 97.5 F (36.4 C) 97.7 F (36.5 C) 97.7 F (36.5 C)  TempSrc: Oral Oral Oral Oral  SpO2: 93% 95% 95% 96%  Weight:      Height:       Supplemental O2: Room Air SpO2: 96 %   Physical Exam:   General: Chronically ill-appearing man with contracture left upper extremity, no acute distress, lying in bed Cardiovascular: Normal rate, regular rhythm.  No murmurs, rubs, or gallops Pulmonary : Effort normal, breath sounds normal. No wheezes, rales, or rhonchi Abdominal: soft, nontender,  bowel sounds present Neuro: Mental Status: Patient is awake, alert, oriented x3  Mild dysarthria  Cranial Nerves: II: Pupils equal, round, and reactive to light.   III,IV, VI: EOMI without ptosis or diploplia.  V: Facial sensation is symmetric to light touch and  Temperature. VII: right facial droop VIII: hearing is intact to voice X: Uvula elevates symmetrically, mild dysarthria  XI: Shoulder shrug is symmetric. XII: tongue is midline without atrophy or fasciculations.  Motor: Chronic contractures of left upper extremity and left lower extremity, 0 out of 5 movement.  4 out of 5 right upper extremity and right lower extremity, right grip strength is 3/5 Sensory: Sensation is grossly intact bilateral UEs & LEs     Assessment/Plan:   Active Problems:   CVA (cerebral vascular accident) ([place])   Acute CVA (cerebrovascular accident) ([place])   Weakness   Patient Summary: Hospital day 3 for Gregory Kerr Gregory Kerr a 80 y.o. person living with  CAD, HLD, HTN, BPH, history of stroke with residual left spastic hemiplegia admitted for acute CVA.    Right-sided weakness secondary to acute CVA -Prior history of right thalamic/subcortical infarct in 2007 with significant residual spastic left hemiplegia -Gregory left CR infarct secondary to small vessel disease found on MRI -Neurology has recommended aspirin 81 mg daily and clopidogrel 75 mg daily for 3 weeks ( through July 23rd) and then Plavix alone, can follow-up with Dr. Leonie Man in [place] Risk modification:  Type 2 diabetes Hemoglobin A1c 5.6, at goal.    Hyperlipidemia: LDL 94, goal less than 70 . Lipitor 20 , increased to 40 mg daily   Hypertension: Allow permissive hypertension, recommended goal 1 30-1 50 to 3 days.  Patient 143-176 in                            last 24 hours. Will restart lisinopril at 10 mg daily  Pseudomonal UTI Urine cultures: Pseudomonas aeruginosa, 70k colonies/ml , susceptibility still pending today.  External catheter in place. Patient was started on ciprofloxacin 11/20/2020, stop date 12/04/2020  Diet:  Regular VTE: Enoxaparin Code: Full PT/OT recs: SNF  Dispo: Anticipated discharge to Skilled nursing facility in 1 days pending SNF placement. Medically stable for discharge.  Tamsen Snider, MD PGY3 Internal Medicine Pager: [phone] Please contact the on call pager after 5 pm and on weekends at [phone].

## 2020-11-21 NOTE — Discharge Summary (Addendum)
Name: Gregory Kerr MRN: [id number] DOB: [birth year] 80 y.o. PCP: Virgel Bouquet, MD  Date of Admission: 11/18/2020 10:10 AM Date of Discharge:  Attending Physician: Velna Ochs, MD  Discharge Diagnosis: 1. Ischemic Stroke - Left centrum semioval 2.  Bladder colonization with Pseudomonas  Discharge Medications: Allergies as of 11/21/2020       Reactions   Penicillins Rash   ++++Patient in nursing home. Unable to clarify details/severity of the reaction.++++        Medication List     STOP taking these medications    dipyridamole-aspirin 200-25 MG 12hr capsule Commonly known as: AGGRENOX   simvastatin 20 MG tablet Commonly known as: ZOCOR       TAKE these medications    aspirin 81 MG EC tablet Take 1 tablet (81 mg total) by mouth daily. Swallow whole. Start taking on: November 22, 2020   atorvastatin 40 MG tablet Commonly known as: LIPITOR Take 1 tablet (40 mg total) by mouth daily. Start taking on: November 22, 2020   baclofen 10 MG tablet Commonly known as: LIORESAL Take 10 mg by mouth 4 (four) times daily.   BOOST PO Take 1 Container by mouth daily. Lactose-Reduced 0..04 g/1 kcal/ml liquid   calcium-vitamin D 500-200 MG-UNIT tablet Commonly known as: OSCAL WITH D Take 1 tablet by mouth 2 (two) times daily.   ciprofloxacin 250 MG tablet Commonly known as: CIPRO Take 1 tablet (250 mg total) by mouth 2 (two) times daily.   clopidogrel 75 MG tablet Commonly known as: PLAVIX Take 1 tablet (75 mg total) by mouth daily for 19 days. Start taking on: November 22, 2020   Dextromethorphan-quiNIDine 20-10 MG capsule Commonly known as: NUEDEXTA Take 1 capsule by mouth daily. For Pseudobulbar Affect (PBA)   finasteride 5 MG tablet Commonly known as: PROSCAR Take 5 mg by mouth daily.   gabapentin 100 MG capsule Commonly known as: NEURONTIN Take 100 mg by mouth 3 (three) times daily.   hydroxypropyl methylcellulose / hypromellose 2.5 % ophthalmic  solution Commonly known as: ISOPTO TEARS / GONIOVISC Place 1 drop into both eyes.   lisinopril 10 MG tablet Commonly known as: ZESTRIL Take 10 mg by mouth daily.   loperamide 2 MG tablet Commonly known as: IMODIUM A-D Take 4 mg by mouth 2 (two) times daily as needed for diarrhea or loose stools.   loratadine 10 MG tablet Commonly known as: CLARITIN Take 10 mg by mouth daily.   mirabegron ER 50 MG Tb24 tablet Commonly known as: MYRBETRIQ Take 50 mg by mouth at bedtime.   nitroGLYCERIN 0.4 MG SL tablet Commonly known as: NITROSTAT Place 0.4 mg under the tongue every 5 (five) minutes as needed for chest pain.   pantoprazole 20 MG tablet Commonly known as: PROTONIX Take 20 mg by mouth daily.   polyethylene glycol 17 g packet Commonly known as: MIRALAX / GLYCOLAX Take 17 g by mouth daily as needed (constipation).   PrePLUS 27-1 MG Tabs Take 1 tablet by mouth daily.   Prolia 60 MG/ML Sosy injection Generic drug: denosumab Inject 60 mg into the skin every 6 (six) months.   sertraline 50 MG tablet Commonly known as: ZOLOFT Take 50 mg by mouth daily.   solifenacin 5 MG tablet Commonly known as: VESICARE Take 1 tablet (5 mg total) by mouth daily.   tamsulosin 0.4 MG Caps capsule Commonly known as: FLOMAX Take 1 capsule (0.4 mg total) by mouth at bedtime.  Discharge Care Instructions  (From admission, onward)           Start     Ordered   11/21/20 0000  Discharge wound care:       Comments: Continue LLE with soap and water, rinse and dry then apply Sween moisturizing lotion daily.   11/21/20 1328            Disposition and follow-up:   Gregory Kerr was discharged from [facility] in Stable condition.  At the hospital follow up visit please address:  1.  Acute CVA: Patient should be on aspirin 81 mg daily and clopidogrel 75 mg daily for 3 weeks, until July 23, and then Plavix alone.  Can have follow-up with Dr.  Leonie Man  Pseudomonal growing in urine: Susceptibilities listed with labs.  Patient was having weakness which improved .  Also on review of chart has had bladder issues and no other signs or symptoms of infection.  Likely this is due to colonization. Decided not to treat.   Hypertension: Permissive hypertension right after stroke, then goal of 130-150.  Restarted home lisinopril on day of discharge to achieve goal of 130-150. Titrate antihypertensives as needed to maintain this goal.  2.  Labs / imaging needed at time of follow-up: none 3.  Pending labs/ test needing follow-up: none   Follow-up Appointments:  Contact information for follow-up providers     Garvin Fila, MD Follow up in 1 month(s).   Specialties: Neurology, Radiology Why: stroke clinic Contact information: [address] [place] [address] [phone]              Contact information for after-discharge care     Destination     [facility] .   Service: Skilled Nursing Contact information: [address] [place] [facility] Course by problem list: 1. Right sided weakness due to acute CVA Imaging results at the bottom of this report.  Found to have left CR infarct likely secondary to small vessel disease.  Neurology recommended aspirin 81 mg daily and clopidogrel 75 mg daily for 3 weeks and then Plavix alone.   - Risk modification was addressed as follows:  Type 2 diabetes Hemoglobin A1c 5.6, at goal.   Hyperlipidemia: LDL 94, goal less than 70 . Lipitor 20 , increased to 40 mg daily   Hypertension: Allow permissive hypertension, recommended goal 130-150 to 3 days.  Patient 143-176 in                           last 24 hours.  Started lisinopril 10 mg on 7/3 and patient's facility is ready for discharge.   Pseudomonal in urine Urine cultures: Pseudomonas aeruginosa, 70k colonies/ml , susceptibility  below. External catheter in place. Patient was started on ciprofloxacin 11/20/2020, stopped.  Symptoms likely secondary to chronic overactive bladder and stroke.  Will recommend continued monitoring at skilled nursing facility, but likely chronically colonized and does not need treating.  Patient was afebrile and normal white count on admission   Discharge Exam:   BP 137/80 (BP Location: Right Arm)   Pulse 79   Temp (!) 97.5 F (36.4 C) (Oral)   Resp 18   Ht 5\' 9"  (1.753 m)   Wt 74.8 kg   SpO2 96%   BMI  24.37 kg/m  Discharge exam:  General: Chronically ill-appearing man with contracture left upper extremity, no acute distress, lying in bed Cardiovascular: Normal rate, regular rhythm.  No murmurs, rubs, or gallops Pulmonary : Effort normal, breath sounds normal. No wheezes, rales, or rhonchi Abdominal: soft, nontender,  bowel sounds present Neuro: Mental Status: Patient is awake, alert, oriented x3  Mild dysarthria  Cranial Nerves: II: Pupils equal, round, and reactive to light.   III,IV, VI: EOMI without ptosis or diploplia. V: Facial sensation is symmetric to light touch and  Temperature. VII: right facial droop VIII: hearing is intact to voice X: Uvula elevates symmetrically, mild dysarthria  XI: Shoulder shrug is symmetric. XII: tongue is midline without atrophy or fasciculations. Motor: Chronic contractures of left upper extremity and left lower extremity, 0 out of 5 movement.  4 out of 5 right upper extremity and right lower extremity, right grip strength is 3/5 Sensory: Sensation is grossly intact bilateral UEs & LEs  Pertinent Labs, Studies, and Procedures:  CBC Latest Ref Rng & Units 11/19/2020 11/19/2020 11/18/2020  WBC 4.0 - 10.5 K/uL 5.2 4.6 9.1  Hemoglobin 13.0 - 17.0 g/dL 13.7 13.3 14.5  Hematocrit 39.0 - 52.0 % 41.5 40.0 43.2  Platelets 150 - 400 K/uL 148(L) 136(L) 160   CMP Latest Ref Rng & Units 11/20/2020 11/19/2020 11/18/2020  Glucose 70 - 99 mg/dL 95 84 108(H)  BUN  8 - 23 mg/dL 17 16 20   Creatinine 0.61 - 1.24 mg/dL 0.78 0.89 1.03  Sodium 135 - 145 mmol/L 135 138 140  Potassium 3.5 - 5.1 mmol/L 4.0 4.3 3.8  Chloride 98 - 111 mmol/L 103 107 106  CO2 22 - 32 mmol/L 24 25 24   Calcium 8.9 - 10.3 mg/dL 8.8(L) 8.3(L) 9.2  Total Protein 6.5 - 8.1 g/dL - - 6.4(L)  Total Bilirubin 0.3 - 1.2 mg/dL - - 0.4  Alkaline Phos 38 - 126 U/L - - 81  AST 15 - 41 U/L - - 23  ALT 0 - 44 U/L - - 18   Component 3 d ago   Specimen Description URINE, CATHETERIZED   Special Requests NONE  Performed at [facility] [address], [place], [place] [address]   Culture 70,000 COLONIES/mL PSEUDOMONAS AERUGINOSA Abnormal    Report Status 11/21/2020 FINAL   Organism ID, Bacteria PSEUDOMONAS AERUGINOSA Abnormal    Resulting Agency [facility]      Susceptibility   Pseudomonas aeruginosa    MIC    CEFEPIME 2 SENSITIVE  Sensitive    CEFTAZIDIME <=1 SENSITIVE  Sensitive    CIPROFLOXACIN >=4 RESISTANT  Resistant    GENTAMICIN 2 SENSITIVE  Sensitive    IMIPENEM >=16 RESIST... Resistant    PIP/TAZO <=4 SENSITIVE  Sensitive             CT ANGIO HEAD NECK W WO CM  Addendum Date: 11/19/2020   ADDENDUM REPORT: 11/19/2020 13:26 ADDENDUM: Findings discussed with Dr. Erlinda Hong via telephone at 1:04 p.m. Electronically Signed   By: Margaretha Sheffield MD   On: 11/19/2020 13:26   Result Date: 11/19/2020 CLINICAL DATA:  Stroke follow-up. EXAM: CT ANGIOGRAPHY HEAD AND NECK TECHNIQUE: Multidetector CT imaging of the head and neck was performed using the standard protocol during bolus administration of intravenous contrast. Multiplanar CT image reconstructions and MIPs were obtained to evaluate the vascular anatomy. Carotid stenosis measurements (when applicable) are obtained utilizing NASCET criteria, using the distal internal carotid diameter as the denominator. CONTRAST:  182mL OMNIPAQUE  IOHEXOL 350 MG/ML SOLN COMPARISON:  MRI and CT November 18, 2020.  MRA January 06, 2020. FINDINGS: CT  HEAD FINDINGS Brain: Known small acute infarct in the left centrum semiovale was better characterized on recent MRI head. Similar chronic microvascular ischemic disease and atrophy. No acute hemorrhage. No interval acute large vascular territory infarct. No mass lesion, extra-axial fluid collection, hydrocephalus, mass lesion, or abnormal mass effect. Vascular: See below. Skull: No acute fracture. Sinuses: Left maxillary sinus retention cyst. Orbits: No acute orbital findings. Review of the MIP images confirms the above findings CTA NECK FINDINGS Aortic arch: Atherosclerosis of the aorta and its branch vessels. The aortic arch is dilated, measuring 3.6 cm. Right carotid system: Calcific and noncalcific atherosclerosis at the carotid bifurcation with approximately 30-40% stenosis. Left carotid system: Predominately calcific atherosclerosis with approximately 30% stenosis. Vertebral arteries: Co dominant. No evidence of greater than 50% stenosis. Mild atherosclerotic narrowing of the left vertebral artery origin. Bilateral vertebral arteries are tortuous. Skeleton: Severe degenerative disease at C4-C5, C5-C6, and C6-C7 with disc height loss, endplate sclerosis and endplate spurring. Other neck: Patulous esophagus with debris. Upper chest: Rounded opacity in the lateral left midlung (series 7, image 172) Review of the MIP images confirms the above findings CTA HEAD FINDINGS Evaluation limited by venous contamination Anterior circulation: Poor opacification limits evaluation with bilateral proximal carotid canal narrowing at the skull base that may be severe. Lateralized course of both carotid canals with preserved bony covering along the middle ear. Calcific atherosclerosis of bilateral cavernous and paraclinoid ICAs with mild-to-moderate narrowing bilaterally. Bilateral MCAs and ACAs are patent. Moderate left M1 MCA stenosis. Weight limited evaluation of and 2 MCA branches due to venous contamination. No aneurysm  identified. Posterior circulation: Bilateral intradural vertebral arteries and, basilar artery, and bilateral none posterior arteries are patent without evidence of a proximal flow limiting stenosis. Multifocal mild narrowing of the PCAs bilaterally. No aneurysm identified. Venous sinuses: Non opacification of the small left sigmoid sinus and jugular bulb. Review of the MIP images confirms the above findings IMPRESSION: CT Head: 1. Known small acute infarct in the left centrum semiovale was better characterized on recent MRI head. 2. No evidence of other acute intracranial abnormality. CTA Head: 1. Likely severe bilateral proximal carotid canal narrowing at the skull base. Lateralized course of both carotid canals with preserved bony covering along the middle ear. 2. Moderate left M1 MCA stenosis. Limited evaluation of the M2 MCA branches due to venous contamination. 3. Mild-to-moderate narrowing of the cavernous and paraclinoid ICAs. 4. Non opacification of a small left sigmoid sinus and jugular bulb. While this could be due to contrast timing and narrowing of the sinus, dural sinus thrombosis is not excluded given the right sided sigmoid sinus and jugular bulb are opacified on this study. Postcontrast MRI could further evaluate for dural sinus thrombosis if clinically indicated. CTA Neck: 1. Bilateral carotid bifurcation atherosclerosis with approximately 30-40% stenosis on the right and 30% stenosis on the left. 2. Rounded opacity in the lateral left midlung (series 7, image 172) which is incompletely imaged on this study. Recommend dedicated chest imaging to further characterize. 3. Patulous esophagus with debris, placing the patient at increased risk for aspiration. 4. Dilated aortic arch, measuring up to 3.6 cm. Recommend annual imaging followup by CTA or MRA. This recommendation follows 2010 ACCF/AHA/AATS/ACR/ASA/SCA/SCAI/SIR/STS/SVM Guidelines for the Diagnosis and Management of Patients with Thoracic Aortic  Disease. Circulation.2010; 121: Z001-V494. Aortic aneurysm NOS (ICD10-I71.9) Electronically Signed: By: Margaretha Sheffield MD On: 11/19/2020 12:45  CT HEAD WO CONTRAST  Result Date: 11/18/2020 CLINICAL DATA:  Right-sided weakness beginning yesterday. EXAM: CT HEAD WITHOUT CONTRAST TECHNIQUE: Contiguous axial images were obtained from the base of the skull through the vertex without intravenous contrast. COMPARISON:  03/19/2007 CT.  MRI 01/06/2020. FINDINGS: Brain: Age related volume loss. Chronic small-vessel ischemic changes of the hemispheric white matter. Old lacunar infarction of the right thalamus. No evidence of recent infarction, mass lesion, hemorrhage, hydrocephalus or extra-axial collection. Congenital hypoplasia the corpus callosum. Vascular: There is atherosclerotic calcification of the major vessels at the base of the brain. Skull: Negative Sinuses/Orbits: Clear except for small amount of fluid and retention cyst in the left maxillary sinus. Orbits negative. Other: None IMPRESSION: No acute finding. Age related atrophy. Chronic small-vessel change of the white matter. 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 Congenital hypoplasia of the corpus callosum. Electronically Signed   By: Nelson Chimes M.D.   On: 11/18/2020 12:08   CT CHEST WO CONTRAST  Result Date: 11/19/2020 CLINICAL DATA:  Weakness EXAM: CT CHEST WITHOUT CONTRAST TECHNIQUE: Multidetector CT imaging of the chest was performed following the standard protocol without IV contrast. COMPARISON:  CT 05/07/2019, radiograph FINDINGS: Cardiovascular: Limited evaluation the absence of contrast media. 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 Aorta returns to a caliber by the of the distal aortic arch of approximately 2.9 cm. Atherosclerotic plaque throughout the aorta. No hyperdense mural thickening or plaque displacement to suggest intramural hematoma. No periaortic stranding or other gross  acute abnormality. Calcifications in the normally branching great vessels. Cardiac size within normal limits. Calcifications within the coronary arteries, most prominently in the LAD and right coronary. No pericardial effusion. Central pulmonary arteries are normal caliber. No major venous abnormality. Mediastinum/Nodes: Large hiatal hernia containing much of the stomach. Slight organo-axial rotation without evidence of obstruction. Similar configuration to comparison exam. Thoracic esophagus is otherwise unremarkable. Some posterior bowing of the trachea may be related to imaging during exhalation. Trachea is otherwise unremarkable. Normal thyroid gland and thoracic inlet. No concerning mediastinal or axillary adenopathy. Hilar nodes difficult to fully assess in the absence of contrast media. Lungs/Pleura: Passive atelectatic changes adjacent the hiatal hernia in the left lung base. These are quite similar to comparison prior. Some additional dependent atelectatic ground-glass noted posteriorly as well. No clear superimposed acute consolidative process is seen. Mild bronchitic changes are present however with diffuse airways thickening and scattered secretions. No pneumothorax. No layering pleural effusion. No concerning pulmonary nodules or masses. Upper Abdomen: Prior cholecystectomy. Partial fatty replacement of the pancreas. No pancreatic ductal dilatation or surrounding inflammatory changes. Hiatal hernia as above. Musculoskeletal: No acute osseous abnormality or suspicious osseous lesion. Degenerative changes are present in the imaged spine and shoulders. Included portions of the upper extremity demonstrate degenerative changes in the left wrist and hand without worrisome or acute osseous abnormality. No conspicuous chest wall mass or lesion. IMPRESSION: 1. Large hiatal hernia with organo-axial rotation albeit without evidence of obstruction. Similar configuration to prior. Adjacent areas of passive  atelectatic change in the left lung base. 2. Additional dependent atelectatic features bilaterally. 3. Mild airways thickening and scattered secretions could reflect acute or chronic airways inflammation/bronchitic change or aspiration given the presence of hernia. 4. Coronary artery atherosclerosis. 5. Stable ascending thoracic aortic dilatation to 4.3 cm. Recommend annual imaging followup by CTA or MRA. This recommendation follows 2010 ACCF/AHA/AATS/ACR/ASA/SCA/SCAI/SIR/STS/SVM Guidelines for the Diagnosis and Management of Patients with Thoracic Aortic Disease. Circulation. 2010; 121: G992-E268. Aortic  aneurysm NOS (ICD10-I71.9) 6.  Aortic Atherosclerosis (ICD10-I70.0). Electronically Signed   By: Lovena Le M.D.   On: 11/19/2020 22:30   MR BRAIN WO CONTRAST  Result Date: 11/18/2020 CLINICAL DATA:  Stroke follow-up EXAM: MRI HEAD WITHOUT CONTRAST TECHNIQUE: Multiplanar, multiecho pulse sequences of the brain and surrounding structures were obtained without intravenous contrast. COMPARISON:  01/06/2020 FINDINGS: Brain: Small focus of abnormal diffusion restriction within the left centrum semiovale. No acute or chronic hemorrhage. There is multifocal hyperintense T2-weighted signal within the white matter. Advanced atrophy for age. The midline structures are normal. Vascular: Major flow voids are preserved. Skull and upper cervical spine: Normal calvarium and skull base. Visualized upper cervical spine and soft tissues are normal. Sinuses/Orbits:Left maxillary retention cyst.  Normal orbits. IMPRESSION: 1. Small focus of acute ischemia within the left centrum semiovale. No hemorrhage or mass effect. 2. Advanced atrophy for age. Electronically Signed   By: Ulyses Jarred M.D.   On: 11/18/2020 23:00   ECHOCARDIOGRAM COMPLETE  Result Date: 11/19/2020    ECHOCARDIOGRAM REPORT   Patient Name:   Gregory Kerr Date of Exam: 11/19/2020 Medical Rec #:  [id number]     Height:       69.0 in Accession #:    [id number]     Weight:       165.0 lb Date of Birth:  [birth year]    BSA:          1.904 m Patient Age:    42 years      BP:           154/78 mmHg Patient Gender: M             HR:           75 bpm. Exam Location:  Inpatient Procedure: 2D Echo, Cardiac Doppler and Color Doppler Indications:    Stroke I63.9  History:        Patient has prior history of Echocardiogram examinations, most                 recent 04/03/2014. Stroke; Risk Factors:Hypertension and                 Dyslipidemia. Left spastic hemiplegia.  Sonographer:    Darlina Sicilian RDCS Referring Phys: [place]  Sonographer Comments: No apical window. IMPRESSIONS  1. Left ventricular ejection fraction, by estimation, is 60 to 65%. The left ventricle has normal function. The left ventricle has no regional wall motion abnormalities. Left ventricular diastolic parameters are consistent with Grade II diastolic dysfunction (pseudonormalization). Elevated left ventricular end-diastolic pressure.  2. Right ventricular systolic function is mildly reduced. The right ventricular size is normal.  3. The mitral valve is grossly normal. No evidence of mitral valve regurgitation. No evidence of mitral stenosis.  4. The aortic valve is normal in structure. Aortic valve regurgitation is trivial.  5. Aortic dilatation noted. There is mild dilatation of the ascending aorta, measuring 39 mm. FINDINGS  Left Ventricle: Left ventricular ejection fraction, by estimation, is 60 to 65%. The left ventricle has normal function. The left ventricle has no regional wall motion abnormalities. The left ventricular internal cavity size was normal in size. There is  no left ventricular hypertrophy. Left ventricular diastolic parameters are consistent with Grade II diastolic dysfunction (pseudonormalization). Elevated left ventricular end-diastolic pressure. Right Ventricle: The right ventricular size is normal. Right vetricular wall thickness was not well visualized. Right ventricular systolic  function is mildly reduced. Left Atrium: Left atrial size was  normal in size. Right Atrium: Right atrial size was normal in size. Pericardium: There is no evidence of pericardial effusion. Mitral Valve: The mitral valve is grossly normal. No evidence of mitral valve regurgitation. No evidence of mitral valve stenosis. Tricuspid Valve: The tricuspid valve is grossly normal. Tricuspid valve regurgitation is not demonstrated. Aortic Valve: The aortic valve is normal in structure. Aortic valve regurgitation is trivial. Pulmonic Valve: The pulmonic valve was normal in structure. Pulmonic valve regurgitation is not visualized. Aorta: Aortic dilatation noted. There is mild dilatation of the ascending aorta, measuring 39 mm. IAS/Shunts: The atrial septum is grossly normal.  LEFT VENTRICLE PLAX 2D LVIDd:         4.30 cm  Diastology LVIDs:         2.70 cm  LV e' medial:    5.65 cm/s LV PW:         0.80 cm  LV E/e' medial:  15.6 LV IVS:        0.80 cm  LV e' lateral:   3.79 cm/s LVOT diam:     2.20 cm  LV E/e' lateral: 23.3 LV SV:         29 LV SV Index:   15 LVOT Area:     3.80 cm  RIGHT VENTRICLE RV S prime:     12.60 cm/s TAPSE (M-mode): 1.6 cm LEFT ATRIUM           Index LA diam:      3.60 cm 1.89 cm/m LA Vol (A4C): 25.5 ml 13.39 ml/m  AORTIC VALVE LVOT Vmax:   50.20 cm/s LVOT Vmean:  32.900 cm/s LVOT VTI:    0.077 m  AORTA Ao Root diam: 3.50 cm Ao Asc diam:  3.90 cm MITRAL VALVE MV Area (PHT): 4.26 cm    SHUNTS MV Decel Time: 178 msec    Systemic VTI:  0.08 m MV E velocity: 88.40 cm/s  Systemic Diam: 2.20 cm MV A velocity: 64.20 cm/s MV E/A ratio:  1.38 Mertie Moores MD Electronically signed by Mertie Moores MD Signature Date/Time: 11/19/2020/1:06:49 PM    Final    VAS [place] UPPER EXTREMITY VENOUS DUPLEX  Result Date: 11/20/2020 UPPER VENOUS STUDY  Patient Name:  Gregory Kerr  Date of Exam:   11/19/2020 Medical Rec #: [id number]      Accession #:    [id number] Date of Birth: 28-May-1940     Patient Gender: M Patient Age:    79Y Exam Location:  [facility] Procedure:      VAS [place] UPPER EXTREMITY VENOUS DUPLEX Referring Phys: [id number] [place] --------------------------------------------------------------------------------  Indications: left IJ occlusion on CTA but patent on MRI Comparison Study: no prior Performing Technologist: Archie Patten RVS  Examination Guidelines: A complete evaluation includes B-mode imaging, spectral Doppler, color Doppler, and power Doppler as needed of all accessible portions of each vessel. Bilateral testing is considered an integral part of a complete examination. Limited examinations for reoccurring indications may be performed as noted.  Right Findings: +----------+------------+---------+-----------+----------+-------+ RIGHT     CompressiblePhasicitySpontaneousPropertiesSummary +----------+------------+---------+-----------+----------+-------+ Subclavian    Full       Yes       Yes                      +----------+------------+---------+-----------+----------+-------+  Left Findings: +----------+------------+---------+-----------+----------+-------+ LEFT      CompressiblePhasicitySpontaneousPropertiesSummary +----------+------------+---------+-----------+----------+-------+ IJV           Full       Yes  Yes                      +----------+------------+---------+-----------+----------+-------+ Subclavian    Full       Yes       Yes                      +----------+------------+---------+-----------+----------+-------+ Axillary      Full       Yes       Yes                      +----------+------------+---------+-----------+----------+-------+ Brachial      Full       Yes       Yes                      +----------+------------+---------+-----------+----------+-------+ Radial        Full                                          +----------+------------+---------+-----------+----------+-------+ Ulnar         Full                                           +----------+------------+---------+-----------+----------+-------+ Cephalic      Full                                          +----------+------------+---------+-----------+----------+-------+ Basilic       Full                                          +----------+------------+---------+-----------+----------+-------+  Summary:  Right: No evidence of thrombosis in the subclavian.  Left: No evidence of deep vein thrombosis in the upper extremity. No evidence of superficial vein thrombosis in the upper extremity. No evidence of thrombosis in the subclavian.  *See table(s) above for measurements and observations.  Diagnosing physician: Ruta Hinds MD Electronically signed by Ruta Hinds MD on 11/20/2020 at 9:23:36 AM.    Final      Discharge Instructions: Discharge Instructions     Ambulatory referral to Neurology   Complete by: As directed    Follow up with Dr. Leonie Man at [facility] in 4-6 weeks.  This patient is Dr. Clydene Fake patient. Thanks.   Diet - low sodium heart healthy   Complete by: As directed    Discharge wound care:   Complete by: As directed    Continue LLE with soap and water, rinse and dry then apply Sween moisturizing lotion daily.   Increase activity slowly   Complete by: As directed        Signed: Madalyn Rob, MD 11/21/2020, 1:29 PM   Pager: [phone]

## 2020-11-21 NOTE — TOC Transition Note (Signed)
Transition of Care [facility]) - CM/SW Discharge Note   Patient Details  Name: Gregory Kerr MRN: [id number] Date of Birth: 17-Jan-1941  [facility] of [facility] CM/SW Contact:  Coralee Pesa, [place] Phone Number: 11/21/2020, 1:27 PM   Clinical Narrative:     Pt to be transported back to [facility] via [place]. Nurse to call report to [phone]  Final next level of care: [place] Barriers to Discharge: Barriers Resolved   Patient Goals and CMS Choice Patient states their goals for this hospitalization and ongoing recovery are:: to get back to [facility] with therapy CMS Medicare.gov Compare Post Acute Care list provided to:: Patient Choice offered to / list presented to : Patient  Discharge Placement              Patient chooses bed at: [facility] Patient to be transferred to facility by: [place] Name of family member notified: [name] Patient and family notified of of transfer: 11/21/20  Discharge Plan and Services     Post Acute Care Choice: [place]                               Social Determinants of Health (SDOH) Interventions     Readmission Risk Interventions No flowsheet data found.

## 2020-12-01 ENCOUNTER — Ambulatory Visit: Payer: Medicare Other | Admitting: Internal Medicine

## 2021-02-14 ENCOUNTER — Other Ambulatory Visit (HOSPITAL_COMMUNITY): Payer: Self-pay | Admitting: Urology

## 2021-02-14 DIAGNOSIS — R339 Retention of urine, unspecified: Secondary | ICD-10-CM

## 2021-02-15 ENCOUNTER — Encounter (HOSPITAL_COMMUNITY): Payer: Self-pay | Admitting: Radiology

## 2021-02-15 NOTE — Progress Notes (Signed)
Patient Name  Doak, Mah Legal Sex  Male DOB  15-Sep-1940 SSN  [id number] Address  [address]  [address]  [place] [address] Phone  [phone] [facility])  [phone] (Work) *Preferred*  [phone] (Mobile)    RE: CT Drain / Suprapubic tube placement Received: Today Suttle, Rosanne Ashing, MD  Garth Bigness D Approved for CT guided suprapubic catheter placement.   [place]        Previous Messages   ----- Message -----  From: Garth Bigness D  Sent: 02/14/2021   5:59 PM EDT  To: Ir Procedure Requests  Subject: CT Drain / Suprapubic tube placement           Procedure:  CT Drain / Suprapubic tube placement   Reason:  Incomplete bladder emptying,  Suprapubic tube placement, patient has hx of CVA x2, recurrent UTI's and incomplete bladder emptying   History:  CT in computer   Provider:  Ceasar Mons   Provider Contact:  [phone]

## 2021-02-24 ENCOUNTER — Encounter: Payer: Self-pay | Admitting: Neurology

## 2021-02-24 ENCOUNTER — Ambulatory Visit (INDEPENDENT_AMBULATORY_CARE_PROVIDER_SITE_OTHER): Payer: Medicare Other | Admitting: Neurology

## 2021-02-24 VITALS — BP 144/81 | HR 73 | Ht 69.5 in | Wt 178.0 lb

## 2021-02-24 DIAGNOSIS — G8110 Spastic hemiplegia affecting unspecified side: Secondary | ICD-10-CM | POA: Diagnosis not present

## 2021-02-24 DIAGNOSIS — I6381 Other cerebral infarction due to occlusion or stenosis of small artery: Secondary | ICD-10-CM | POA: Diagnosis not present

## 2021-02-24 NOTE — Progress Notes (Signed)
[facility] [address] [place]. [place] [address] [phone]       OFFICE FOLLOW UP VISIT NOTE  Mr. TRAYSON STITELY Date of Birth:  April 05, 1941 Medical Record Number:  [id number]   Referring MD: Virgel Bouquet Reason for Referral: Increased left-sided weakness  MPN:TIRWERX visit 12/02/2019 Mr. Hibberd is a 80 year old pleasant Caucasian male seen today for initial office consultation visit for stroke and increased weakness. History is obtained from the patient and review of referral notes and electronic medical records. No relevant imaging films are available in PACS. He has past medical history of stroke with spastic left hemiplegia, hypertension, hyperlipidemia, benign prostatic hypertrophy, depression and gastroesophageal reflux disease. Patient states he developed right subcortical infarct in 2007 and has had significant residual spastic left hemiplegia. He was in fact seen by Dr. Mary Sella and received Botox injections which did not show significant benefit and hence he was lost to follow-up since January 2017. He has been living in assisted living section [facility] for the last 13 years. He is at baseline wheelchair-bound and unable to ambulate. However he has been able to transfer himself out from a wheelchair to his bed and he has noticed that for the last few months he has had increased weakness in his left leg. He is finding it increasing difficulty with activities like standing up, transferring out of bed, pulling his pants up. He feels his left leg may be weaker. He denies any increase slurred speech, double vision, loss of vision, upper extremity weakness or headaches. He has had no recent brain imaging studies or any work-up done for this weakness. He denies any recurrent stroke or TIA symptoms in the last 13 years. He had a transient episode of blurred vision which lasted few days in the past but did not seek medical help. He has had surgery in the right eye for retinal  detachment last year which went well. I was unable to review records from his previous stroke in 2007 or imaging studies today. Update 07/14/2020 : He returns for follow-up after last visit 6 months ago.  Patient continues to have left leg weakness which has not improved.  He in fact has even trouble transferring out of the wheelchair and and pulling up his pants.  He continues to stay at [facility] and needs help for these activities of daily living.  He is finished physical Occupational Therapy and is no longer getting them.  He had lab work done at last visit which showed elevated LDL of 107 mg percent and I increase his simvastatin dose to 40 mg daily but is not yet had any follow-up labs.  His hemoglobin A1c at that visit was 5.7.  MRI scan of the brain done on 01/08/2020 showed old lacunar infarcts in the right thalamus, left pons and right medulla and changes of small vessel disease.  No acute abnormality was noted.  MRI of the brain and neck both did not show significant large vessel extracranial intracranial stenosis.  He has no new complaints.  He remains on Aggrenox which is tolerating well without side effects.  His blood pressure is usually under good control today slightly elevated 154/78. Update 02/24/2021 : He returns for follow-up after last visit 6 7-1/2 months ago.  He was readmitted on 11/18/2020 with new onset of right hemiplegia.  MRI scan showed a left corona radiata acute lacunar infarct.  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  Echocardiogram showed ejection  fraction of 60 to 65%.  LDL cholesterol was 84 mg percent and hemoglobin A1c was 5.6.  Urine drug screen was negative.  He was started on dual antiplatelet therapy aspirin Plavix and subsequently switched to Plavix.  He is back in the nursing home.  He is getting physical Occupational Therapy but is no longer able to stand or walk which she was previously able to do with some difficulty.   He can help a little bit with transfers.  His right upper extremity strength appears to be improving and he has good strength in the right grip though he still has some weakness.  Right leg strength is still not back and he can barely move it against gravity.  Is tolerating Plavix well without bruising or bleeding.  His blood pressure is well controlled today it is 144/81.  He is tolerating Lipitor well without muscle aches and pains. 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   Pneumonia    "once or twice" (09/22/2014)   Retinal detachment    left eye   Stroke [facility]) 2007   "left side doesn't work now" (09/22/2014)    Social History:  Social History   Socioeconomic History   Marital status: Single    Spouse name: Not on file   Number of children: Not on file   Years of education: Not on file   Highest education level: Not on file  Occupational History   Not on file  Tobacco Use   Smoking status: Former    Packs/day: 1.00    Types: Cigarettes    Quit date: 12/20/2005    Years since quitting: 15.1   Smokeless tobacco: Never  Substance and Sexual Activity   Alcohol use: Not Currently    Comment: 09/22/2014 "stopped in 2007; drank occasionally when I did drink"   Drug use: No   Sexual activity: Never  Other Topics Concern   Not on file  Social History Narrative   Full time resident at [facility]   Right handed   Drinks caffeine occassionally   Social Determinants of Health   Financial Resource Strain: Not on file  Food Insecurity: Not on file   Transportation Needs: Not on file  Physical Activity: Not on file  Stress: Not on file  Social Connections: Not on file  Intimate Partner Violence: Not on file    Medications:   Current Outpatient Medications on File Prior to Visit  Medication Sig Dispense Refill   atorvastatin (LIPITOR) 40 MG tablet Take 1 tablet (40 mg total) by mouth daily. 30 tablet 0   baclofen (LIORESAL) 10 MG tablet Take 10 mg by mouth 4 (four) times daily.      bisacodyl (DULCOLAX) 10 MG suppository Place 10 mg rectally as needed for moderate constipation.     calcium-vitamin D (OSCAL WITH D) 500-200 MG-UNIT tablet Take 1 tablet by mouth 2 (two) times daily.     clopidogrel (PLAVIX) 75 MG tablet Take 1 tablet (75 mg total) by mouth daily. 30 tablet 0   Dextromethorphan-quiNIDine (NUEDEXTA) 20-10  MG capsule Take 1 capsule by mouth daily. For Pseudobulbar Affect (PBA)     doxycycline (VIBRAMYCIN) 100 MG capsule Take 100 mg by mouth 2 (two) times daily.     finasteride (PROSCAR) 5 MG tablet Take 5 mg by mouth daily.     gabapentin (NEURONTIN) 100 MG capsule Take 100 mg by mouth 3 (three) times daily.     hydroxypropyl methylcellulose / hypromellose (ISOPTO TEARS / GONIOVISC) 2.5 % ophthalmic solution Place 1 drop into both eyes.     lisinopril (ZESTRIL) 10 MG tablet Take 10 mg by mouth daily.     loperamide (IMODIUM A-D) 2 MG tablet Take 4 mg by mouth 2 (two) times daily as needed for diarrhea or loose stools.     loratadine (CLARITIN) 10 MG tablet Take 10 mg by mouth daily.     mirabegron ER (MYRBETRIQ) 50 MG TB24 tablet Take 50 mg by mouth at bedtime.     nitroGLYCERIN (NITROSTAT) 0.4 MG SL tablet Place 0.4 mg under the tongue every 5 (five) minutes as needed for chest pain.     Nutritional Supplements (BOOST PO) Take 1 Container by mouth daily. Lactose-Reduced 0..04 g/1 kcal/ml liquid     nystatin powder Apply 1 application topically 3 (three) times daily.     pantoprazole (PROTONIX) 20 MG tablet Take 20 mg by  mouth daily.     polyethylene glycol (MIRALAX / GLYCOLAX) packet Take 17 g by mouth daily as needed (constipation).      Prenatal Vit-Fe Fumarate-FA (PREPLUS) 27-1 MG TABS Take 1 tablet by mouth daily.      PROLIA 60 MG/ML SOSY injection Inject 60 mg into the skin every 6 (six) months.     sertraline (ZOLOFT) 50 MG tablet Take 50 mg by mouth daily.     solifenacin (VESICARE) 5 MG tablet Take 1 tablet (5 mg total) by mouth daily. 30 tablet 0   tamsulosin (FLOMAX) 0.4 MG CAPS capsule Take 1 capsule (0.4 mg total) by mouth at bedtime. 30 capsule 0   traMADol (ULTRAM) 50 MG tablet Take by mouth every 6 (six) hours as needed.     No current facility-administered medications on file prior to visit.    Allergies:   Allergies  Allergen Reactions   Penicillins Rash    ++++Patient in nursing home. Unable to clarify details/severity of the reaction.++++    Physical Exam General: Pleasant elderly Caucasian male seated, in electric wheelchair.  In no evident distress Head: head normocephalic and atraumatic.   Neck: supple with no carotid or supraclavicular bruits Cardiovascular: regular rate and rhythm, no murmurs Musculoskeletal: no deformity Skin:  no rash/petichiae Vascular:  Normal pulses all extremities  Neurologic Exam Mental Status: Awake and fully alert. Oriented to place and time. Recent and remote memory intact. Attention span, concentration and fund of knowledge appropriate. Mood and affect appropriate.  Cranial Nerves: Fundoscopic exam not done pupils equal, briskly reactive to light. Extraocular movements full without nystagmus. Visual fields full to confrontation. Hearing intact. Facial sensation intact. Mild left lower facial weakness., tongue, palate moves normally and symmetrically.  Motor: Spastic left hemiplegia with 2/5 strength proximally at the shoulders and hips with significant weakness of the left elbow as well as left knee and ankle. Left foot drop with ankle and knee  strength 0/5. Able to bend fingers in the left hand but weakness of the left wrist and elbow. Tone is increased significantly on the left compared to the right.  Right upper extremity strength 4/5 with  weakness of right grip and intrinsic hand muscles.  Right lower extremity strength is 2/5 proximally and 3/5 distally.. Left foot drop. Wearing a left ankle brace. Fixed flexion contracture of the left elbow and wrist but not of the fingers Sensory.: intact to touch , pinprick , position and vibratory sensation.  Coordination: Impaired left finger-to-nose and needle coordination. Normal on the right. Gait and Station: Deferred as patient is wheelchair-bound and unable to walk. Reflexes: 2+ and asymmetric and brisker on the left. Toes downgoing.   NIHSS  12 Modified Rankin  4   ASSESSMENT: 80 year old Caucasian male with spastic left hemiplegia following right brain subcortical infarct in 2007 with recent left subcortical infarct in June 2022 with new right hemiparesis.  Vascular risk factors of hypertension hyperlipidemia , prior stroke and age     PLAN: I had a long d/w patient about his recent lacunar stroke, spastic hemiplegia from his previous stroke risk for recurrent stroke/TIAs, personally independently reviewed imaging studies and stroke evaluation results and answered questions.Continue clopidogrel 75 mg daily  for secondary stroke prevention and maintain strict control of hypertension with blood pressure goal below 130/90, diabetes with hemoglobin A1c goal below 6.5% and lipids with LDL cholesterol goal below 70 mg/dL. I also advised the patient to eat a healthy diet with plenty of whole grains, cereals, fruits and vegetables, exercise regularly and maintain ideal body weight.  Continue ongoing physical and occupational therapy.  Followup in the future with me only as needed and no schedule appointment was made.Greater than 50% time during this 35-minute  visit was spent on counseling and  coordination of care about his spastic left hemiplegia   and evaluation and treatment plan and answering questions.  Antony Contras, MD  [facility] [address] [place], [place] [address]  Phone [phone] Fax [phone] Note: This document was prepared with digital dictation and possible smart phrase technology. Any transcriptional errors that result from this process are unintentional.

## 2021-02-24 NOTE — Patient Instructions (Signed)
I had a long d/w patient about his recent lacunar stroke, spastic hemiplegia from his previous stroke risk for recurrent stroke/TIAs, personally independently reviewed imaging studies and stroke evaluation results and answered questions.Continue clopidogrel 75 mg daily  for secondary stroke prevention and maintain strict control of hypertension with blood pressure goal below 130/90, diabetes with hemoglobin A1c goal below 6.5% and lipids with LDL cholesterol goal below 70 mg/dL. I also advised the patient to eat a healthy diet with plenty of whole grains, cereals, fruits and vegetables, exercise regularly and maintain ideal body weight.  Continue ongoing physical and occupational therapy.  Followup in the future with me only as needed and no schedule appointment was made.   Stroke Prevention Some medical conditions and behaviors are associated with a higher chance of having a stroke. You can help prevent a stroke by making nutrition, lifestyle, and other changes, including managing any medical conditions you may have. What nutrition changes can be made?  Eat healthy foods. You can do this by: Choosing foods high in fiber, such as fresh fruits and vegetables and whole grains. Eating at least 5 or more servings of fruits and vegetables a day. Try to fill half of your plate at each meal with fruits and vegetables. Choosing lean protein foods, such as lean cuts of meat, poultry without skin, fish, tofu, beans, and nuts. Eating low-fat dairy products. Avoiding foods that are high in salt (sodium). This can help lower blood pressure. Avoiding foods that have saturated fat, trans fat, and cholesterol. This can help prevent high cholesterol. Avoiding processed and premade foods. Follow your health care provider's specific guidelines for losing weight, controlling high blood pressure (hypertension), lowering high cholesterol, and managing diabetes. These may include: Reducing your daily calorie  intake. Limiting your daily sodium intake to 1,500 milligrams (mg). Using only healthy fats for cooking, such as olive oil, canola oil, or sunflower oil. Counting your daily carbohydrate intake. What lifestyle changes can be made? Maintain a healthy weight. Talk to your health care provider about your ideal weight. Get at least 30 minutes of moderate physical activity at least 5 days a week. Moderate activity includes brisk walking, biking, and swimming. Do not use any products that contain nicotine or tobacco, such as cigarettes and e-cigarettes. If you need help quitting, ask your health care provider. It may also be helpful to avoid exposure to secondhand smoke. Limit alcohol intake to no more than 1 drink a day for nonpregnant women and 2 drinks a day for men. One drink equals 12 oz of beer, 5 oz of wine, or 1 oz of hard liquor. Stop any illegal drug use. Avoid taking birth control pills. Talk to your health care provider about the risks of taking birth control pills if: You are over 69 years old. You smoke. You get migraines. You have ever had a blood clot. What other changes can be made? Manage your cholesterol levels. Eating a healthy diet is important for preventing high cholesterol. If cholesterol cannot be managed through diet alone, you may also need to take medicines. Take any prescribed medicines to control your cholesterol as told by your health care provider. Manage your diabetes. Eating a healthy diet and exercising regularly are important parts of managing your blood sugar. If your blood sugar cannot be managed through diet and exercise, you may need to take medicines. Take any prescribed medicines to control your diabetes as told by your health care provider. Control your hypertension. To reduce your risk of stroke,  try to keep your blood pressure below 130/80. Eating a healthy diet and exercising regularly are an important part of controlling your blood pressure. If your  blood pressure cannot be managed through diet and exercise, you may need to take medicines. Take any prescribed medicines to control hypertension as told by your health care provider. Ask your health care provider if you should monitor your blood pressure at home. Have your blood pressure checked every year, even if your blood pressure is normal. Blood pressure increases with age and some medical conditions. Get evaluated for sleep disorders (sleep apnea). Talk to your health care provider about getting a sleep evaluation if you snore a lot or have excessive sleepiness. Take over-the-counter and prescription medicines only as told by your health care provider. Aspirin or blood thinners (antiplatelets or anticoagulants) may be recommended to reduce your risk of forming blood clots that can lead to stroke. Make sure that any other medical conditions you have, such as atrial fibrillation or atherosclerosis, are managed. What are the warning signs of a stroke? The warning signs of a stroke can be easily remembered as BEFAST. B is for balance. Signs include: Dizziness. Loss of balance or coordination. Sudden trouble walking. E is for eyes. Signs include: A sudden change in vision. Trouble seeing. F is for face. Signs include: Sudden weakness or numbness of the face. The face or eyelid drooping to one side. A is for arms. Signs include: Sudden weakness or numbness of the arm, usually on one side of the body. S is for speech. Signs include: Trouble speaking (aphasia). Trouble understanding. T is for time. These symptoms may represent a serious problem that is an emergency. Do not wait to see if the symptoms will go away. Get medical help right away. Call your local emergency services (911 in the [place] Do not drive yourself to the hospital. Other signs of stroke may include: A sudden, severe headache with no known cause. Nausea or vomiting. Seizure. Where to find more information For more  information, visit: American Stroke Association: www.strokeassociation.org National Stroke Association: www.stroke.org Summary You can prevent a stroke by eating healthy, exercising, not smoking, limiting alcohol intake, and managing any medical conditions you may have. Do not use any products that contain nicotine or tobacco, such as cigarettes and e-cigarettes. If you need help quitting, ask your health care provider. It may also be helpful to avoid exposure to secondhand smoke. Remember BEFAST for warning signs of stroke. Get help right away if you or a loved one has any of these signs. This information is not intended to replace advice given to you by your health care provider. Make sure you discuss any questions you have with your health care provider. Document Revised: 04/20/2017 Document Reviewed: 06/13/2016 Elsevier Patient Education  2021 [employer].

## 2021-02-25 ENCOUNTER — Telehealth: Payer: Self-pay | Admitting: Radiation Oncology

## 2021-02-25 NOTE — Telephone Encounter (Signed)
[place] to schedule consultation with Dr. Isidore Moos for Mr. [place]. No answer, LM for a return call.

## 2021-03-04 NOTE — Progress Notes (Signed)
Oncology Nurse Navigator Documentation   I called Mr. Walkowiak at [facility] and left a voice mail with their transportation services to confirm that they were aware that he had an appointment here at [facility] for consult on 10/18 at 9:30. I left my direct contact information to call me back.  I also called Mr. Mazor sister, [name] to introduce myself as his nurse navigator and left a voice mail with my direct contact information as well.  Harlow Asa RN, BSN, OCN Head & Neck Oncology Nurse [place] at [facility] Phone # [phone]  Fax # [phone]

## 2021-03-07 NOTE — Progress Notes (Signed)
Radiation Oncology         [phone] ________________________________  Initial Outpatient Consultation  Name: Gregory Kerr MRN: [id number]  Date: 03/08/2021  DOB: 1940/09/22  NG:[id number], Amy, MD  Gregory Miner, MD   REFERRING PHYSICIAN: Griselda Miner, MD  DIAGNOSIS:    ICD-10-CM   1. 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 Cancer of skin of face  C44.300       Skin Cancer - Basal Cell Carcinoma of right mid preauricular area  Cancer Staging 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 Staging form: Cutaneous Carcinoma of the Head and Neck, AJCC 8th Edition - Clinical stage from 03/08/2021: Stage II (cT2, cN0, cM0) - Signed by Eppie Gibson, MD on 03/08/2021 Extraosseous extension: Absent  CHIEF COMPLAINT: Here to discuss management of skin cancer  HISTORY OF PRESENT ILLNESS::Gregory Kerr is a 80 y.o. male who presents with a fairly extensive history of skin cancer (detailed below). He presents today for consideration of radiation therapy for new site of infiltrative BCC.  Skin biopsy of the right mid preauricular area on 10/26/20 revealed basal cell carcinoma, nodular pattern.  He then proceeded with Mohs surgery by Dr. Sarajane Jews on 01/03/2021.  There were positive deep margins and due to the patient's other medical issues and potential difficulty in surgically clearing the deep margin further surgery was not advised.  I do not have a path report available but the patient's medical chart has been reviewed in detail, including Dr. Everett Graff notes.  Intraoperative path report communicated to Dr. Sarajane Jews indicated that margins were persistently positive.  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  He presented to the [facility] ED on 11/18/20 with right sided weakness involving his right arm and leg, decreased  coordination, and numbness. During ED evaluation, CT scan did not show any acute abnormality and labs were unremarkable. The patient was accordingly admitted for further workup. MRI scan showed a left corona radiata acute lacunar infarct.  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 Discharge diagnosis was for acute CVA and the patient was discharged home on 11/21/20.   Of note: patient lives in nursing home.  He presents in a wheelchair today.  He has bilateral paralysis.  He is alert, provides a good history, pleasant to speak with.  He declines having family be called on speaker phone to discuss his medical issues today.  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    PAST SURGICAL HISTORY: Past Surgical History:  Procedure Laterality Date   APPENDECTOMY  05/27/2009   Ronnald Collum, MD   CATARACT EXTRACTION W/ INTRAOCULAR LENS  IMPLANT, BILATERAL Bilateral ?2014   CHOLECYSTECTOMY N/A 09/26/2014   Procedure: LAPAROSCOPIC CHOLECYSTECTOMY WITH INTRAOPERATIVE CHOLANGIOGRAM;  Surgeon: Donnie Mesa, MD;  Location: [place];  Service: General;  Laterality: N/A;   COLONOSCOPY WITH PROPOFOL N/A 12/29/2015   Procedure: COLONOSCOPY WITH PROPOFOL;  Surgeon: Wilford Corner, MD;  Location: [facility] ENDOSCOPY;  Service: Endoscopy;  Laterality: N/A;   ERCP N/A 09/25/2014  Procedure: ENDOSCOPIC RETROGRADE CHOLANGIOPANCREATOGRAPHY (ERCP);  Surgeon: Clarene Essex, MD;  Location: [facility] ENDOSCOPY;  Service: Endoscopy;  Laterality: N/A;   PARS PLANA VITRECTOMY Left 01/30/2019   Procedure: PARS PLANA VITRECTOMY WITH  25 GAUGE;  Surgeon: Sherlynn Stalls, MD;  Location: [place];  Service: Ophthalmology;  Laterality: Left;   PARS PLANA VITRECTOMY Right 11/13/2019   Procedure: PARS PLANA VITRECTOMY WITH 25 GAUGE WITH REMOVAL OF POSTERIOR CAPSULAR OPACIFICATION WITH VITRECTOR;  Surgeon: Sherlynn Stalls, MD;  Location: [place];  Service: Ophthalmology;  Laterality: Right;   PHOTOCOAGULATION WITH LASER Left 01/30/2019   Procedure: PHOTOCOAGULATION WITH LASER;  Surgeon: Sherlynn Stalls, MD;  Location: [place];  Service: Ophthalmology;  Laterality: Left;    FAMILY HISTORY: family history includes Cancer in his maternal aunt and paternal aunt; Heart attack in his father and mother.  SOCIAL HISTORY:  reports that he quit smoking about 15 years ago. His smoking use included cigarettes. He smoked an average of 1 pack per day. He has never used smokeless tobacco. He reports that he does not currently use alcohol. He reports that he does not use drugs.  ALLERGIES: Penicillins  MEDICATIONS:  Current Outpatient Medications  Medication Sig Dispense Refill   atorvastatin (LIPITOR) 40 MG tablet Take 1 tablet (40 mg total) by mouth daily. 30 tablet 0   baclofen (LIORESAL) 10 MG tablet Take 10 mg by mouth 4 (four) times daily.      bisacodyl (DULCOLAX) 10 MG suppository Place 10 mg rectally as needed for moderate constipation.     calcium-vitamin D (OSCAL WITH D) 500-200 MG-UNIT tablet Take 1 tablet by mouth 2 (two) times daily.     clopidogrel (PLAVIX) 75 MG tablet Take 1 tablet (75 mg total) by mouth daily. 30 tablet 0   Dextromethorphan-quiNIDine (NUEDEXTA) 20-10 MG capsule Take 1 capsule by mouth daily. For Pseudobulbar Affect (PBA)     finasteride (PROSCAR) 5 MG tablet Take 5 mg by mouth daily.     gabapentin (NEURONTIN) 100 MG capsule Take 100 mg by mouth 3 (three) times daily.     hydroxypropyl methylcellulose / hypromellose (ISOPTO TEARS / GONIOVISC) 2.5 % ophthalmic solution Place 1 drop into both eyes.     lisinopril (ZESTRIL)  10 MG tablet Take 10 mg by mouth daily.     loperamide (IMODIUM A-D) 2 MG tablet Take 4 mg by mouth 2 (two) times daily as needed for diarrhea or loose stools.     loratadine (CLARITIN) 10 MG tablet Take 10 mg by mouth daily.     mirabegron ER (MYRBETRIQ) 50 MG TB24 tablet Take 50 mg by mouth at bedtime.     nitroGLYCERIN (NITROSTAT) 0.4 MG SL tablet Place 0.4 mg under the tongue every 5 (five) minutes as needed for chest pain.     Nutritional Supplements (BOOST PO) Take 1 Container by mouth daily. Lactose-Reduced 0..04 g/1 kcal/ml liquid     nystatin (MYCOSTATIN/NYSTOP) powder Apply 1 application topically 3 (three) times daily.     pantoprazole (PROTONIX) 20 MG tablet Take 20 mg by mouth daily.     polyethylene glycol (MIRALAX / GLYCOLAX) packet Take 17 g by mouth daily as needed (constipation).      prednisoLONE acetate (PRED FORTE) 1 % ophthalmic suspension Place 1 drop into both eyes 3 (three) times daily.     Prenatal Vit-Fe Fumarate-FA (PREPLUS) 27-1 MG TABS Take 1 tablet by mouth daily.      PROLIA 60 MG/ML SOSY injection Inject 60 mg into the skin every 6 (  six) months.     sertraline (ZOLOFT) 50 MG tablet Take 50 mg by mouth daily.     solifenacin (VESICARE) 5 MG tablet Take 1 tablet (5 mg total) by mouth daily. 30 tablet 0   tamsulosin (FLOMAX) 0.4 MG CAPS capsule Take 1 capsule (0.4 mg total) by mouth at bedtime. 30 capsule 0   traMADol (ULTRAM) 50 MG tablet Take by mouth every 6 (six) hours as needed.     doxycycline (VIBRAMYCIN) 100 MG capsule Take 100 mg by mouth 2 (two) times daily. (Patient not taking: Reported on 03/08/2021)     No current facility-administered medications for this encounter.    REVIEW OF SYSTEMS:  Notable for that above.   PHYSICAL EXAM:  temperature is 97.7 F (36.5 C). His blood pressure is 139/94 (abnormal) and his pulse is 76. His respiration is 24 (abnormal) and oxygen saturation is 94%.   General: Alert and oriented, in no acute distress  HEENT:  Notable for healing tissue at right preauricular area.  The patient still has moist desquamation at the surgical site.  Bandage was removed and site was cleaned by nursing.  Patient has healed adequately to start planning radiation next week. Neck: Neck is supple, no palpable cervical or supraclavicular lymphadenopathy.  He does not have any palpable adenopathy in the pre or postauricular regions either Heart: Regular in rate and rhythm with no murmurs, rubs, or gallops. Chest: Clear to auscultation bilaterally, with no rhonchi, wheezes, or rales. Lymphatics: see Neck Exam Skin: See HEENT Musculoskeletal: Patient presents in a wheelchair and is not moving his extremities. Neurologic: Bilateral paralysis secondary to strokes Psychiatric: Judgment and insight are intact. Affect is appropriate.   ECOG = 4  0 - Asymptomatic (Fully active, able to carry on all predisease activities without restriction)  1 - Symptomatic but completely ambulatory (Restricted in physically strenuous activity but ambulatory and able to carry out work of a light or sedentary nature. For example, light housework, office work)  2 - Symptomatic, <50% in bed during the day (Ambulatory and capable of all self care but unable to carry out any work activities. Up and about more than 50% of waking hours)  3 - Symptomatic, >50% in bed, but not bedbound (Capable of only limited self-care, confined to bed or chair 50% or more of waking hours)  4 - Bedbound (Completely disabled. Cannot carry on any self-care. Totally confined to bed or chair)  5 - Death   Eustace Pen MM, Creech RH, Tormey DC, et al. [phone]). "Toxicity and response criteria of the [facility]". [place] Oncol. 5 (6): 649-55   LABORATORY DATA:  Lab Results  Component Value Date   WBC 5.2 11/19/2020   HGB 13.7 11/19/2020   HCT 41.5 11/19/2020   MCV 88.9 11/19/2020   PLT 148 (L) 11/19/2020   CMP     Component Value Date/Time   NA 135  11/20/2020 0348   NA 141 12/02/2019 1052   K 4.0 11/20/2020 0348   CL 103 11/20/2020 0348   CO2 24 11/20/2020 0348   GLUCOSE 95 11/20/2020 0348   BUN 17 11/20/2020 0348   BUN 16 12/02/2019 1052   CREATININE 0.78 11/20/2020 0348   CALCIUM 8.8 (L) 11/20/2020 0348   PROT 6.4 (L) 11/18/2020 1055   PROT 6.5 12/02/2019 1052   ALBUMIN 3.2 (L) 11/18/2020 1055   ALBUMIN 3.9 12/02/2019 1052   AST 23 11/18/2020 1055   ALT 18 11/18/2020 1055   ALKPHOS 81  11/18/2020 1055   BILITOT 0.4 11/18/2020 1055   BILITOT 0.4 12/02/2019 1052   GFRNONAA >60 11/20/2020 0348   GFRAA 100 12/02/2019 1052        RADIOGRAPHY: No results found.    IMPRESSION/PLAN:Today, I talked to the patient about the findings and work-up thus far.  We discussed the patient's diagnosis of basal cell carcinoma of the face, with positive margins, and general treatment for this, highlighting the role of radiotherapy in the management.  We discussed the available radiation techniques, and focused on the details of logistics and delivery.     Given his comorbidities I recommend that he receive his treatment twice a week for 5 weeks.  This hypofractionated course has a good chance of curing his disease and I think it will be well-tolerated.  Electron therapy will be used.  We discussed the risks, benefits, and side effects of radiotherapy. Side effects may include but not necessarily be limited to: Skin irritation, fatigue, local hair loss.  We discussed less common side effects including permanent injury to the soft tissues, bone, and cartilage in the irradiated fields.  There is a possibility of having some dry mouth or salivary changes in the future given the proximity of the parotid gland to the treatment field.  No guarantees of treatment were given. A consent form was signed and placed in the patient's medical record. The patient was encouraged to ask questions that I answered to the best of my ability.  He is enthusiastic to  proceed with treatment.  Treatment planning will be scheduled for next week.   Forms filled out today to communicate plan with his healthcare providers at [facility].  On date of service, in total, I spent 45 minutes on this encounter. Patient was seen in person.   __________________________________________   Eppie Gibson, MD  This document serves as a record of services personally performed by Eppie Gibson, MD. It was created on her behalf by Roney Mans, a trained medical scribe. The creation of this record is based on the scribe's personal observations and the provider's statements to them. This document has been checked and approved by the attending provider.

## 2021-03-07 NOTE — Progress Notes (Addendum)
Histology and Location of Primary Skin Cancer:  Basal cell carcinoma of right mid preauricular area, with positive infiltrative margins  Gregory Kerr presented with the following signs/symptoms: patient reports area of concern on right side of head has been present for several months (unable to recall exact timeframe)  Past/Anticipated interventions by patient's surgeon/dermatologist for current problematic lesion, if any:  02/23/2021 Dr. Griselda Miner (office F/U)  01/03/2021 --Dr. Griselda Miner Mohs surgery with Burow's full thickness skin graft  10/26/2020 --Dr. Harriett Sine Skin biopsy  right mid preauricular and left mid lateral zygoma  Past skin cancers, if any:   History of Blistering sunburns, if any: Reports occurences during his youth  SAFETY ISSUES: Prior radiation? No Pacemaker/ICD? No Possible current pregnancy? N/A Is the patient on methotrexate? No  Current Complaints / other details:  Patient has resided at [facility] for the past 15 years. Sustained a stroke back in 11/2020 which resulted in right sided waekness. Reports his sister [name] is his POA

## 2021-03-08 ENCOUNTER — Ambulatory Visit
Admission: RE | Admit: 2021-03-08 | Discharge: 2021-03-08 | Disposition: A | Discharge status: Home / Self Care | Payer: Medicare Other | Source: Ambulatory Visit | Attending: Radiation Oncology | Admitting: Radiation Oncology

## 2021-03-08 ENCOUNTER — Encounter: Payer: Self-pay | Admitting: Radiation Oncology

## 2021-03-08 ENCOUNTER — Other Ambulatory Visit: Payer: Self-pay

## 2021-03-08 VITALS — BP 139/94 | HR 76 | Temp 97.7°F | Resp 24

## 2021-03-08 DIAGNOSIS — C44310 Basal cell carcinoma of skin of unspecified parts of face: Secondary | ICD-10-CM | POA: Insufficient documentation

## 2021-03-08 DIAGNOSIS — C44300 Unspecified malignant neoplasm of skin of unspecified part of face: Secondary | ICD-10-CM

## 2021-03-08 DIAGNOSIS — Z8673 Personal history of transient ischemic attack (TIA), and cerebral infarction without residual deficits: Secondary | ICD-10-CM | POA: Diagnosis not present

## 2021-03-08 DIAGNOSIS — C44319 Basal cell carcinoma of skin of other parts of face: Secondary | ICD-10-CM

## 2021-03-08 NOTE — Progress Notes (Signed)
Oncology Nurse Navigator Documentation   Met with patient during initial consult with Dr. Isidore Moos.  I introduced myself as his Navigator, explained my role as a member of the Care Team. I provided him with my card to call for any questions, concerns that he may develop.  Assisted with post-consult appt scheduling. He verbalized understanding of information provided. I will call Diane in transportation at [facility] with his planning session appointments and future radiation appointments.  I encouraged him to call with questions/concerns moving forward.  Harlow Asa, RN, BSN, OCN Head & Neck Oncology Nurse [place] at [place] [phone]

## 2021-03-18 ENCOUNTER — Ambulatory Visit
Admission: RE | Admit: 2021-03-18 | Discharge: 2021-03-18 | Disposition: A | Discharge status: Home / Self Care | Payer: Medicare Other | Source: Ambulatory Visit | Attending: Radiation Oncology | Admitting: Radiation Oncology

## 2021-03-18 ENCOUNTER — Other Ambulatory Visit: Payer: Self-pay

## 2021-03-18 DIAGNOSIS — C44319 Basal cell carcinoma of skin of other parts of face: Secondary | ICD-10-CM | POA: Diagnosis not present

## 2021-03-18 NOTE — Progress Notes (Signed)
Oncology Nurse Navigator Documentation   To provide support, encouragement and care continuity, met with Mr. Wakefield before his CT SIM.  He tolerated procedure without difficulty, denied questions/concerns.   Transportation has been arranged with his Nursing facility.   I encouraged him to call me prior to 03/25/21     [place] if he had any questions.   Harlow Asa RN, BSN, OCN Head & Neck Oncology Nurse [place] at [facility] Phone # [phone]  Fax # [phone]

## 2021-03-22 DIAGNOSIS — C44319 Basal cell carcinoma of skin of other parts of face: Secondary | ICD-10-CM | POA: Insufficient documentation

## 2021-03-25 ENCOUNTER — Ambulatory Visit
Admission: RE | Admit: 2021-03-25 | Discharge: 2021-03-25 | Disposition: A | Discharge status: Home / Self Care | Payer: Medicare Other | Source: Ambulatory Visit | Attending: Radiation Oncology | Admitting: Radiation Oncology

## 2021-03-25 DIAGNOSIS — C44319 Basal cell carcinoma of skin of other parts of face: Secondary | ICD-10-CM | POA: Diagnosis not present

## 2021-03-28 ENCOUNTER — Ambulatory Visit: Payer: Medicare Other

## 2021-03-28 ENCOUNTER — Ambulatory Visit
Admission: RE | Admit: 2021-03-28 | Discharge: 2021-03-28 | Disposition: A | Discharge status: Home / Self Care | Payer: Medicare Other | Source: Ambulatory Visit | Attending: Radiation Oncology | Admitting: Radiation Oncology

## 2021-03-28 DIAGNOSIS — C44319 Basal cell carcinoma of skin of other parts of face: Secondary | ICD-10-CM | POA: Diagnosis not present

## 2021-03-29 ENCOUNTER — Ambulatory Visit: Payer: Medicare Other

## 2021-03-30 ENCOUNTER — Ambulatory Visit: Payer: Medicare Other

## 2021-03-31 ENCOUNTER — Ambulatory Visit: Payer: Medicare Other

## 2021-04-01 ENCOUNTER — Other Ambulatory Visit: Payer: Self-pay

## 2021-04-01 ENCOUNTER — Ambulatory Visit
Admission: RE | Admit: 2021-04-01 | Discharge: 2021-04-01 | Disposition: A | Discharge status: Home / Self Care | Payer: Medicare Other | Source: Ambulatory Visit | Attending: Radiation Oncology | Admitting: Radiation Oncology

## 2021-04-01 DIAGNOSIS — C44319 Basal cell carcinoma of skin of other parts of face: Secondary | ICD-10-CM | POA: Diagnosis not present

## 2021-04-04 ENCOUNTER — Ambulatory Visit
Admission: RE | Admit: 2021-04-04 | Discharge: 2021-04-04 | Disposition: A | Discharge status: Home / Self Care | Payer: Medicare Other | Source: Ambulatory Visit | Attending: Radiation Oncology | Admitting: Radiation Oncology

## 2021-04-04 ENCOUNTER — Other Ambulatory Visit: Payer: Self-pay

## 2021-04-04 DIAGNOSIS — C44319 Basal cell carcinoma of skin of other parts of face: Secondary | ICD-10-CM | POA: Diagnosis not present

## 2021-04-05 ENCOUNTER — Ambulatory Visit: Payer: Medicare Other

## 2021-04-06 ENCOUNTER — Ambulatory Visit: Payer: Medicare Other

## 2021-04-07 ENCOUNTER — Other Ambulatory Visit (HOSPITAL_COMMUNITY): Payer: Self-pay | Admitting: Urology

## 2021-04-07 ENCOUNTER — Ambulatory Visit: Payer: Medicare Other

## 2021-04-07 DIAGNOSIS — R339 Retention of urine, unspecified: Secondary | ICD-10-CM

## 2021-04-08 ENCOUNTER — Ambulatory Visit: Payer: Medicare Other

## 2021-04-11 ENCOUNTER — Ambulatory Visit: Payer: Medicare Other

## 2021-04-15 ENCOUNTER — Ambulatory Visit: Payer: Medicare Other

## 2021-04-18 ENCOUNTER — Other Ambulatory Visit: Payer: Self-pay

## 2021-04-18 ENCOUNTER — Ambulatory Visit
Admission: RE | Admit: 2021-04-18 | Discharge: 2021-04-18 | Disposition: A | Discharge status: Home / Self Care | Payer: Medicare Other | Source: Ambulatory Visit | Attending: Radiation Oncology | Admitting: Radiation Oncology

## 2021-04-18 DIAGNOSIS — C44319 Basal cell carcinoma of skin of other parts of face: Secondary | ICD-10-CM | POA: Diagnosis not present

## 2021-04-19 ENCOUNTER — Other Ambulatory Visit: Payer: Self-pay | Admitting: Radiology

## 2021-04-20 ENCOUNTER — Other Ambulatory Visit: Payer: Self-pay

## 2021-04-20 ENCOUNTER — Ambulatory Visit (HOSPITAL_COMMUNITY)
Admission: RE | Admit: 2021-04-20 | Discharge: 2021-04-20 | Disposition: A | Discharge status: Home / Self Care | Payer: Medicare Other | Source: Ambulatory Visit | Attending: Urology | Admitting: Urology

## 2021-04-20 DIAGNOSIS — K449 Diaphragmatic hernia without obstruction or gangrene: Secondary | ICD-10-CM | POA: Insufficient documentation

## 2021-04-20 DIAGNOSIS — Z87440 Personal history of urinary (tract) infections: Secondary | ICD-10-CM | POA: Diagnosis not present

## 2021-04-20 DIAGNOSIS — R339 Retention of urine, unspecified: Secondary | ICD-10-CM | POA: Insufficient documentation

## 2021-04-20 DIAGNOSIS — I10 Essential (primary) hypertension: Secondary | ICD-10-CM | POA: Diagnosis not present

## 2021-04-20 DIAGNOSIS — R3914 Feeling of incomplete bladder emptying: Secondary | ICD-10-CM | POA: Diagnosis not present

## 2021-04-20 DIAGNOSIS — I6789 Other cerebrovascular disease: Secondary | ICD-10-CM | POA: Diagnosis not present

## 2021-04-20 LAB — CBC
HCT: 44.6 % (ref 39.0–52.0)
Hemoglobin: 14.5 g/dL (ref 13.0–17.0)
MCH: 28.5 pg (ref 26.0–34.0)
MCHC: 32.5 g/dL (ref 30.0–36.0)
MCV: 87.6 fL (ref 80.0–100.0)
Platelets: 171 10*3/uL (ref 150–400)
RBC: 5.09 MIL/uL (ref 4.22–5.81)
RDW: 13.9 % (ref 11.5–15.5)
WBC: 5.6 10*3/uL (ref 4.0–10.5)
nRBC: 0 % (ref 0.0–0.2)

## 2021-04-20 LAB — PROTIME-INR
INR: 1 (ref 0.8–1.2)
Prothrombin Time: 12.9 seconds (ref 11.4–15.2)

## 2021-04-20 MED ORDER — SODIUM CHLORIDE 0.9 % IV SOLN: INTRAVENOUS | Status: DC

## 2021-04-20 MED ORDER — FENTANYL CITRATE (PF) 100 MCG/2ML IJ SOLN
INTRAMUSCULAR | Status: AC | PRN
  Administered 2021-04-20: 50 ug via INTRAVENOUS
  Administered 2021-04-20: 25 ug via INTRAVENOUS

## 2021-04-20 MED ORDER — FENTANYL CITRATE (PF) 100 MCG/2ML IJ SOLN
INTRAMUSCULAR | Status: AC
  Filled 2021-04-20: qty 4

## 2021-04-20 MED ORDER — MIDAZOLAM HCL 2 MG/2ML IJ SOLN
INTRAMUSCULAR | Status: AC | PRN
  Administered 2021-04-20: .5 mg via INTRAVENOUS
  Administered 2021-04-20: 1 mg via INTRAVENOUS

## 2021-04-20 MED ORDER — MIDAZOLAM HCL 2 MG/2ML IJ SOLN
INTRAMUSCULAR | Status: AC
  Filled 2021-04-20: qty 4

## 2021-04-20 NOTE — Procedures (Signed)
Pre procedural Dx: Suprapubic catheter Post procedural Dx: Same  Technically successful CT guided placed of a 14 Fr drainage catheter placement into the urinary bladder yielding return of clear urine.   Suprapubic catheter connected to foley bag.   EBL: Trace Complications: None immediate  Ronny Bacon, MD Pager #: [phone]

## 2021-04-20 NOTE — H&P (Signed)
Chief Complaint: Patient was seen in consultation today for suprapubic catheter placement  Referring Physician(s): Winter,Christopher Marjory Lies  Supervising Physician: Sandi Mariscal  Patient Status: [facility] - Out-pt  History of Present Illness: Gregory Kerr is a 80 y.o. male with a medical history significant for HTN, hiatal hernia, left eye retinal detachment and a stroke x2. Post-stroke sequela includes incomplete bladder emptying with recurrent UTIs. His urology team has requested for Interventional Radiology to evaluate this patient for an image-guided suprapubic catheter. This case was reviewed and procedure approved by Dr. Serafina Royals.   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   Pneumonia    "once or twice" (09/22/2014)   Retinal detachment    left eye   Stroke [facility]) 2007   "left side doesn't work now" (09/22/2014)    Past Surgical History:  Procedure Laterality Date   APPENDECTOMY  05/27/2009   Ronnald Collum, MD   CATARACT EXTRACTION W/ INTRAOCULAR LENS  IMPLANT, BILATERAL Bilateral ?2014   CHOLECYSTECTOMY N/A 09/26/2014   Procedure: LAPAROSCOPIC CHOLECYSTECTOMY WITH INTRAOPERATIVE CHOLANGIOGRAM;  Surgeon: Donnie Mesa, MD;  Location: [place];  Service: General;  Laterality: N/A;   COLONOSCOPY WITH PROPOFOL N/A 12/29/2015   Procedure: COLONOSCOPY WITH PROPOFOL;  Surgeon: Wilford Corner, MD;  Location: [facility] ENDOSCOPY;  Service: Endoscopy;  Laterality: N/A;   ERCP N/A 09/25/2014   Procedure: ENDOSCOPIC RETROGRADE CHOLANGIOPANCREATOGRAPHY (ERCP);  Surgeon: Clarene Essex, MD;  Location: [facility] ENDOSCOPY;  Service: Endoscopy;  Laterality: N/A;   PARS PLANA VITRECTOMY Left 01/30/2019   Procedure: PARS PLANA  VITRECTOMY WITH 25 GAUGE;  Surgeon: Sherlynn Stalls, MD;  Location: [place];  Service: Ophthalmology;  Laterality: Left;   PARS PLANA VITRECTOMY Right 11/13/2019   Procedure: PARS PLANA VITRECTOMY WITH 25 GAUGE WITH REMOVAL OF POSTERIOR CAPSULAR OPACIFICATION WITH VITRECTOR;  Surgeon: Sherlynn Stalls, MD;  Location: [place];  Service: Ophthalmology;  Laterality: Right;   PHOTOCOAGULATION WITH LASER Left 01/30/2019   Procedure: PHOTOCOAGULATION WITH LASER;  Surgeon: Sherlynn Stalls, MD;  Location: [place];  Service: Ophthalmology;  Laterality: Left;    Allergies: Penicillins  Medications: Prior to Admission medications   Medication Sig Start Date End Date Taking? Authorizing Provider  atorvastatin (LIPITOR) 40 MG tablet Take 1 tablet (40 mg total) by mouth daily. Patient taking differently: Take 40 mg by mouth every evening. 11/22/20  Yes Madalyn Rob, MD  baclofen (LIORESAL) 20 MG tablet Take 20 mg by mouth 3 (three) times daily.   Yes [provider]  bisacodyl (DULCOLAX) 10 MG suppository Place 10 mg rectally as needed for moderate constipation.   Yes [provider]  calcium-vitamin D (OSCAL WITH D) 500-200 MG-UNIT tablet Take 1 tablet by mouth 2 (two) times daily.   Yes [provider]  ciclopirox (PENLAC) 8 % solution Apply 1 application topically daily. Paint toenails with solution once daily   Yes [provider]  Dextromethorphan-quiNIDine (NUEDEXTA) 20-10 MG capsule Take 1 capsule by mouth daily. For Pseudobulbar Affect (PBA)   Yes [provider]  diclofenac Sodium (VOLTAREN) 1 % GEL Apply 2 g topically in the morning, at noon, and at bedtime. To right shoulder   Yes [provider]  finasteride (PROSCAR) 5 MG  tablet Take 5 mg by mouth daily. 11/08/20  Yes [provider]  gabapentin (NEURONTIN) 100 MG capsule Take 100 mg by mouth 3 (three) times daily.   Yes [provider]  lisinopril (ZESTRIL) 10 MG tablet Take 10 mg by  mouth daily.   Yes [provider]  loperamide (IMODIUM A-D) 2 MG tablet Take 4 mg by mouth 4 (four) times daily as needed for diarrhea or loose stools.   Yes [provider]  loratadine (CLARITIN) 10 MG tablet Take 10 mg by mouth daily.   Yes [provider]  nitroGLYCERIN (NITROSTAT) 0.4 MG SL tablet Place 0.4 mg under the tongue every 5 (five) minutes as needed for chest pain.   Yes [provider]  Nutritional Supplements (BOOST PO) Take 1 Container by mouth daily. Lactose-Reduced 0..04 g/1 kcal/ml liquid   Yes [provider]  pantoprazole (PROTONIX) 20 MG tablet Take 20 mg by mouth daily. 11/17/20  Yes [provider]  polyethylene glycol (MIRALAX / GLYCOLAX) packet Take 17 g by mouth daily as needed (constipation).    Yes [provider]  polyvinyl alcohol (LIQUIFILM TEARS) 1.4 % ophthalmic solution Place 1 drop into both eyes 3 (three) times daily.   Yes [provider]  Prenatal Vit-Fe Fumarate-FA (PREPLUS) 27-1 MG TABS Take 1 tablet by mouth daily.    Yes [provider]  PROLIA 60 MG/ML SOSY injection Inject 60 mg into the skin every 6 (six) months. 09/07/20  Yes [provider]  sertraline (ZOLOFT) 100 MG tablet Take 100 mg by mouth daily.   Yes [provider]  tamsulosin (FLOMAX) 0.4 MG CAPS capsule Take 1 capsule (0.4 mg total) by mouth at bedtime. Patient taking differently: Take 0.4 mg by mouth 2 (two) times daily. 11/21/20  Yes Madalyn Rob, MD  traMADol (ULTRAM) 50 MG tablet Take 50 mg by mouth See admin instructions. Take 50 mg twice daily, may take a 3rd 50 mg dose as needed for pain   Yes [provider]  clopidogrel (PLAVIX) 75 MG tablet Take 1 tablet (75 mg total) by mouth daily. Patient not taking: Reported on 04/18/2021 11/22/20   Madalyn Rob, MD  hydroxypropyl methylcellulose / hypromellose (ISOPTO TEARS / GONIOVISC) 2.5 % ophthalmic solution Place 1 drop into both eyes.     [provider]  solifenacin (VESICARE) 5 MG tablet Take 1 tablet (5 mg total) by mouth daily. Patient not taking: Reported on 04/18/2021 11/21/20   Madalyn Rob, MD     Family History  Problem Relation Age of Onset   Heart attack Mother    Heart attack Father    Cancer Maternal Aunt        Stomach   Cancer Paternal Aunt        Uterine,Colon    Social History   Socioeconomic History   Marital status: Single    Spouse name: Not on file   Number of children: Not on file   Years of education: Not on file   Highest education level: Not on file  Occupational History   Not on file  Tobacco Use   Smoking status: Former    Packs/day: 1.00    Types: Cigarettes    Quit date: 12/20/2005    Years since quitting: 15.3   Smokeless tobacco: Never  Vaping Use   Vaping Use: Never used  Substance and Sexual Activity   Alcohol use: Not Currently    Comment: 09/22/2014 "stopped in 2007; drank occasionally when I  did drink"   Drug use: No   Sexual activity: Never  Other Topics Concern   Not on file  Social History Narrative   Full time resident at [facility]   Right handed   Drinks caffeine occassionally   Social Determinants of Health   Financial Resource Strain: Not on file  Food Insecurity: Not on file  Transportation Needs: Not on file  Physical Activity: Not on file  Stress: Not on file  Social Connections: Not on file    Review of Systems: A 12 point ROS discussed and pertinent positives are indicated in the HPI above.  All other systems are negative.  Review of Systems  Constitutional:  Negative for appetite change and fever.  Respiratory:  Negative for cough and shortness of breath.   Cardiovascular:  Negative for chest pain and leg swelling.  Gastrointestinal:  Negative for abdominal pain, diarrhea, nausea and vomiting.  Genitourinary:  Positive for difficulty urinating.  Musculoskeletal:        Left arm contracted  Neurological:  Negative for dizziness and  headaches.   Vital Signs: BP (!) 143/71   Pulse 67   Temp 98 F (36.7 C) (Oral)   Resp 20   Ht 5' 9.5" (1.765 m)   Wt 175 lb (79.4 kg)   SpO2 97%   BMI 25.47 kg/m   Physical Exam Constitutional:      General: He is not in acute distress.    Appearance: He is not ill-appearing.  HENT:     Mouth/Throat:     Mouth: Mucous membranes are moist.     Pharynx: Oropharynx is clear.  Cardiovascular:     Rate and Rhythm: Normal rate and regular rhythm.     Pulses: Normal pulses.     Heart sounds: Normal heart sounds.  Pulmonary:     Effort: Pulmonary effort is normal.     Breath sounds: Normal breath sounds.  Abdominal:     General: Bowel sounds are normal.     Palpations: Abdomen is soft.     Tenderness: There is no abdominal tenderness.  Musculoskeletal:        General: Deformity present.     Comments: Left arm contracted  Skin:    General: Skin is warm and dry.     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   Neurological:     Mental Status: He is alert and oriented to person, place, and time.    Imaging: No results found.  Labs:  CBC: Recent Labs    11/18/20 1055 11/19/20 0334 11/19/20 1945  WBC 9.1 4.6 5.2  HGB 14.5 13.3 13.7  HCT 43.2 40.0 41.5  PLT 160 136* 148*    COAGS: Recent Labs    11/18/20 1055 04/20/21 0902  INR 1.0 1.0  APTT 27  --     BMP: Recent Labs    11/18/20 1055 11/19/20 0334 11/20/20 0348  NA 140 138 135  K 3.8 4.3 4.0  CL 106 107 103  CO2 24 25 24   GLUCOSE 108* 84 95  BUN 20 16 17   CALCIUM 9.2 8.3* 8.8*  CREATININE 1.03 0.89 0.78  GFRNONAA >60 >60 >60    LIVER FUNCTION TESTS: Recent Labs    11/18/20 1055  BILITOT 0.4  AST 23  ALT 18  ALKPHOS 81  PROT 6.4*  ALBUMIN 3.2*    TUMOR MARKERS: No results for input(s): AFPTM, CEA, CA199, CHROMGRNA in the last 8760 hours.  Assessment and Plan:  History of CVA with incomplete bladder emptying and recurrent UTIs: Gregory Kerr,  80 year old male, presents today to the [facility] department for an image-guided suprapubic catheter.   Risks and benefits discussed with the patient including bleeding, infection, damage to adjacent structures and sepsis.  All of the patient's questions were answered, patient is agreeable to proceed. He has been NPO. Last dose of Plavix was 04/15/21.   Consent signed and in chart.  Thank you for this interesting consult.  I greatly enjoyed meeting Gregory Kerr and look forward to participating in their care.  A copy of this report was sent to the requesting provider on this date.  Electronically Signed: Soyla Dryer, AGACNP-BC [phone] 04/20/2021, 10:31 AM   I spent a total of  30 Minutes   in face to face in clinical consultation, greater than 50% of which was counseling/coordinating care for suprapubic catheter placement.

## 2021-04-22 ENCOUNTER — Ambulatory Visit: Payer: Medicare Other | Attending: Radiation Oncology

## 2021-04-22 DIAGNOSIS — C44319 Basal cell carcinoma of skin of other parts of face: Secondary | ICD-10-CM | POA: Insufficient documentation

## 2021-04-25 ENCOUNTER — Ambulatory Visit: Payer: Medicare Other

## 2021-04-29 ENCOUNTER — Ambulatory Visit: Payer: Medicare Other

## 2021-04-29 NOTE — Progress Notes (Signed)
Oncology Nurse Navigator Documentation   [name] has missed multiple radiation appointments over the past several weeks. I called and spoke with his unit nurse manager last week and at that time she had planned to talk with him about the missed treatments and his future plans. She took my name and number but has not called me back. [name] did not call or come for his appointment today as well so I again called to speak to the unit manager at his Nursing facility and left a voice mail for a return call. I have also left several messages with his primary contact Gregory Kerr (his sister) and have not received a call back.   Harlow Asa RN, BSN, OCN Head & Neck Oncology Nurse [place] at [facility] Phone # [phone]  Fax # [phone]

## 2021-05-02 ENCOUNTER — Ambulatory Visit: Payer: Medicare Other

## 2021-05-02 ENCOUNTER — Encounter: Payer: Self-pay | Admitting: Radiation Oncology

## 2021-05-05 NOTE — Progress Notes (Signed)
Oncology Nurse Navigator Documentation   Mr. Colson nurse at his Nursing facility notified Dr. Isidore Moos nurse Althia Forts that Mr. Erber had decided not to continue his radiation treatment as recommended by Dr. Isidore Moos. He has been removed from the radiation schedule. I have called and left a voice mail with Dr. Sarajane Jews who originally referred him to Dr. Isidore Moos to inform his office of Mr. Okray decision. I left my direct contact information to call me if they had any further questions.  Harlow Asa RN, BSN, OCN Head & Neck Oncology Nurse [place] [facility] Phone # [phone]  Fax # [phone]

## 2021-05-06 ENCOUNTER — Ambulatory Visit: Payer: Medicare Other

## 2021-05-09 ENCOUNTER — Ambulatory Visit: Payer: Medicare Other

## 2021-05-13 ENCOUNTER — Ambulatory Visit: Payer: Medicare Other

## 2021-05-16 ENCOUNTER — Ambulatory Visit: Payer: Medicare Other

## 2021-05-17 ENCOUNTER — Ambulatory Visit: Payer: Medicare Other

## 2021-05-24 ENCOUNTER — Emergency Department (HOSPITAL_COMMUNITY): Payer: Medicare Other

## 2021-05-24 ENCOUNTER — Other Ambulatory Visit: Payer: Self-pay

## 2021-05-24 ENCOUNTER — Emergency Department (HOSPITAL_COMMUNITY)
Admission: EM | Admit: 2021-05-24 | Discharge: 2021-05-25 | Disposition: A | Discharge status: Home / Self Care | Payer: Medicare Other | Attending: Emergency Medicine | Admitting: Emergency Medicine

## 2021-05-24 DIAGNOSIS — G8191 Hemiplegia, unspecified affecting right dominant side: Secondary | ICD-10-CM | POA: Diagnosis not present

## 2021-05-24 DIAGNOSIS — T83028A Displacement of other indwelling urethral catheter, initial encounter: Secondary | ICD-10-CM | POA: Insufficient documentation

## 2021-05-24 DIAGNOSIS — Z7902 Long term (current) use of antithrombotics/antiplatelets: Secondary | ICD-10-CM | POA: Diagnosis not present

## 2021-05-24 DIAGNOSIS — N3090 Cystitis, unspecified without hematuria: Secondary | ICD-10-CM | POA: Insufficient documentation

## 2021-05-24 DIAGNOSIS — Y846 Urinary catheterization as the cause of abnormal reaction of the patient, or of later complication, without mention of misadventure at the time of the procedure: Secondary | ICD-10-CM | POA: Insufficient documentation

## 2021-05-24 DIAGNOSIS — T83010A Breakdown (mechanical) of cystostomy catheter, initial encounter: Secondary | ICD-10-CM

## 2021-05-24 HISTORY — PX: IR CATHETER TUBE CHANGE: IMG717

## 2021-05-24 LAB — COMPREHENSIVE METABOLIC PANEL
ALT: 69 U/L — ABNORMAL HIGH (ref 0–44)
AST: 52 U/L — ABNORMAL HIGH (ref 15–41)
Albumin: 3.2 g/dL — ABNORMAL LOW (ref 3.5–5.0)
Alkaline Phosphatase: 105 U/L (ref 38–126)
Anion gap: 9 (ref 5–15)
BUN: 33 mg/dL — ABNORMAL HIGH (ref 8–23)
CO2: 23 mmol/L (ref 22–32)
Calcium: 9.3 mg/dL (ref 8.9–10.3)
Chloride: 107 mmol/L (ref 98–111)
Creatinine, Ser: 1.19 mg/dL (ref 0.61–1.24)
GFR, Estimated: >60 mL/min (ref >60)
Glucose, Bld: 111 mg/dL — ABNORMAL HIGH (ref 70–99)
Potassium: 4.9 mmol/L (ref 3.5–5.1)
Sodium: 139 mmol/L (ref 135–145)
Total Bilirubin: 0.6 mg/dL (ref 0.3–1.2)
Total Protein: 7.2 g/dL (ref 6.5–8.1)

## 2021-05-24 LAB — URINALYSIS, ROUTINE W REFLEX MICROSCOPIC

## 2021-05-24 LAB — CBC WITH DIFFERENTIAL/PLATELET
Abs Immature Granulocytes: 0.02 10*3/uL (ref 0.00–0.07)
Basophils Absolute: 0.1 10*3/uL (ref 0.0–0.1)
Basophils Relative: 1 %
Eosinophils Absolute: 0.1 10*3/uL (ref 0.0–0.5)
Eosinophils Relative: 1 %
HCT: 48.2 % (ref 39.0–52.0)
Hemoglobin: 15.9 g/dL (ref 13.0–17.0)
Immature Granulocytes: 0 %
Lymphocytes Relative: 19 %
Lymphs Abs: 1.8 10*3/uL (ref 0.7–4.0)
MCH: 29.4 pg (ref 26.0–34.0)
MCHC: 33 g/dL (ref 30.0–36.0)
MCV: 89.1 fL (ref 80.0–100.0)
Monocytes Absolute: 0.7 10*3/uL (ref 0.1–1.0)
Monocytes Relative: 7 %
Neutro Abs: 6.9 10*3/uL (ref 1.7–7.7)
Neutrophils Relative %: 72 %
Platelets: 220 10*3/uL (ref 150–400)
RBC: 5.41 MIL/uL (ref 4.22–5.81)
RDW: 14.6 % (ref 11.5–15.5)
WBC: 9.5 10*3/uL (ref 4.0–10.5)
nRBC: 0 % (ref 0.0–0.2)

## 2021-05-24 LAB — URINALYSIS, MICROSCOPIC (REFLEX)

## 2021-05-24 MED ORDER — METHOCARBAMOL 1000 MG/10ML IJ SOLN
500.0000 mg | Freq: Once | INTRAVENOUS | Status: AC
  Administered 2021-05-24: 500 mg via INTRAVENOUS
  Filled 2021-05-24: qty 5

## 2021-05-24 MED ORDER — IOHEXOL 300 MG/ML  SOLN
100.0000 mL | Freq: Once | INTRAMUSCULAR | Status: AC | PRN
  Administered 2021-05-24: 10 mL

## 2021-05-24 MED ORDER — METHOCARBAMOL 1000 MG/10ML IJ SOLN: 500.0000 mg | Freq: Once | INTRAMUSCULAR | Status: DC

## 2021-05-24 MED ORDER — LIDOCAINE VISCOUS HCL 2 % MT SOLN
OROMUCOSAL | Status: DC | PRN
  Administered 2021-05-24: 15 mL via OROMUCOSAL

## 2021-05-24 MED ORDER — SODIUM CHLORIDE 0.9 % IV BOLUS
1000.0000 mL | Freq: Once | INTRAVENOUS | Status: AC
  Administered 2021-05-24: 1000 mL via INTRAVENOUS

## 2021-05-24 MED ORDER — LIDOCAINE VISCOUS HCL 2 % MT SOLN
OROMUCOSAL | Status: AC
  Filled 2021-05-24: qty 15

## 2021-05-24 MED ORDER — CIPROFLOXACIN HCL 500 MG PO TABS: 500.0000 mg | ORAL_TABLET | Freq: Two times a day (BID) | ORAL | 0 refills | Status: AC

## 2021-05-24 MED ORDER — CEFTRIAXONE SODIUM 1 G IJ SOLR: 1.0000 g | Freq: Once | INTRAMUSCULAR | Status: DC

## 2021-05-24 MED ORDER — CIPROFLOXACIN IN D5W 400 MG/200ML IV SOLN
400.0000 mg | Freq: Two times a day (BID) | INTRAVENOUS | Status: DC
  Administered 2021-05-24: 400 mg via INTRAVENOUS
  Filled 2021-05-24: qty 200

## 2021-05-24 NOTE — ED Notes (Signed)
To IR

## 2021-05-24 NOTE — ED Provider Notes (Signed)
[facility] EMERGENCY DEPARTMENT Provider Note   CSN: [id number] Arrival date & time: 05/24/21  8588     History  Chief Complaint  Patient presents with   Catheter issues   Dysuria    Gregory Kerr is a 81 y.o. 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  Patient states that since Monday he has had painful urination with blood present.  Patient states that the pain comes and goes.  Patient describes the pain as "spasming and burning".  Patient has indwelling suprapubic catheter that he states was originally placed by his urology office 2 to 3 weeks ago.  Patient states that initially he had no issues with the catheter.  Patient states that the issues began after his nursing home replaced the catheter this last Sunday.  Patient reports after his nursing home replaced the catheter he had "issues with output". Patient reports many UTIs in the past which was initial reason for catheter placement. Patient endorses difficulty urinating, penile discharge, muscle spasms, hematuria, abdominal tenderness. Patient denies fevers, nausea, vomiting, diarrhea.    Dysuria Presenting symptoms: dysuria, penile discharge and penile pain   Associated symptoms: abdominal pain and hematuria   Associated symptoms: no diarrhea, no fever, no flank pain, no nausea and no vomiting       Home Medications Prior to Admission medications   Medication Sig Start Date End Date Taking? Authorizing Provider  atorvastatin (LIPITOR) 40 MG tablet Take 1 tablet (40 mg total) by mouth daily. Patient taking differently: Take 40 mg by mouth every evening. 11/22/20   Madalyn Rob, MD  baclofen (LIORESAL) 20 MG tablet Take 20 mg by mouth 3 (three) times daily.    [provider]  bisacodyl (DULCOLAX) 10 MG suppository Place 10 mg rectally as needed for moderate constipation.    [provider]  calcium-vitamin D (OSCAL WITH D) 500-200  MG-UNIT tablet Take 1 tablet by mouth 2 (two) times daily.    [provider]  ciclopirox (PENLAC) 8 % solution Apply 1 application topically daily. Paint toenails with solution once daily    [provider]  clopidogrel (PLAVIX) 75 MG tablet Take 1 tablet (75 mg total) by mouth daily. Patient not taking: Reported on 04/18/2021 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 To right shoulder    [provider]  finasteride (PROSCAR) 5 MG tablet Take 5 mg by mouth daily. 11/08/20   [provider]  gabapentin (NEURONTIN) 100 MG capsule Take 100 mg by mouth 3 (three) times daily.    [provider]  hydroxypropyl methylcellulose / hypromellose (ISOPTO TEARS / GONIOVISC) 2.5 % ophthalmic solution Place 1 drop into both eyes.    [provider]  lisinopril (ZESTRIL) 10 MG tablet Take 10 mg by mouth daily.    [provider]  loperamide (IMODIUM A-D) 2 MG tablet Take 4 mg by mouth 4 (four) times daily as needed for diarrhea or loose stools.    [provider]  loratadine (CLARITIN) 10 MG tablet Take 10 mg by mouth daily.    [provider]  nitroGLYCERIN (NITROSTAT) 0.4 MG SL tablet Place 0.4 mg under the tongue every 5 (five) minutes as needed for chest pain.  [provider]  Nutritional Supplements (BOOST PO) Take 1 Container by mouth daily. Lactose-Reduced 0..04 g/1 kcal/ml liquid    [provider]  pantoprazole (PROTONIX) 20 MG tablet Take 20 mg by mouth daily. 11/17/20   [provider]  polyethylene glycol (MIRALAX / GLYCOLAX) packet Take 17 g by mouth daily as needed (constipation).     [provider]  polyvinyl alcohol (LIQUIFILM TEARS) 1.4 % ophthalmic  solution Place 1 drop into both eyes 3 (three) times daily.    [provider]  Prenatal Vit-Fe Fumarate-FA (PREPLUS) 27-1 MG TABS Take 1 tablet by mouth daily.     [provider]  PROLIA 60 MG/ML SOSY injection Inject 60 mg into the skin every 6 (six) months. 09/07/20   [provider]  sertraline (ZOLOFT) 100 MG tablet Take 100 mg by mouth daily.    [provider]  solifenacin (VESICARE) 5 MG tablet Take 1 tablet (5 mg total) by mouth daily. Patient not taking: Reported on 04/18/2021 11/21/20   Madalyn Rob, MD  tamsulosin (FLOMAX) 0.4 MG CAPS capsule Take 1 capsule (0.4 mg total) by mouth at bedtime. Patient taking differently: Take 0.4 mg by mouth 2 (two) times daily. 11/21/20   Madalyn Rob, MD  traMADol (ULTRAM) 50 MG tablet Take 50 mg by mouth See admin instructions. Take 50 mg twice daily, may take a 3rd 50 mg dose as needed for pain    [provider]      Allergies    Penicillins    Review of Systems   Review of Systems  Constitutional:  Negative for chills and fever.  Gastrointestinal:  Positive for abdominal pain. Negative for diarrhea, nausea and vomiting.  Genitourinary:  Positive for decreased urine volume, difficulty urinating, dysuria, hematuria, penile discharge and penile pain. Negative for flank pain and testicular pain.  All other systems reviewed and are negative.  Physical Exam Updated Vital Signs BP (!) 140/108    Pulse (!) 51    Temp 97.9 F (36.6 C) (Oral)    Resp (!) 24    SpO2 97%  Physical Exam Vitals and nursing note reviewed. Exam conducted with a chaperone present.  Constitutional:      General: He is not in acute distress.    Appearance: He is not ill-appearing or toxic-appearing.  HENT:     Head: Normocephalic.     Nose: Nose normal.     Mouth/Throat:     Mouth: Mucous membranes are moist.  Eyes:     Pupils: Pupils are equal, round, and reactive to light.  Cardiovascular:     Rate and Rhythm: Normal rate  and regular rhythm.  Pulmonary:     Effort: Pulmonary effort is normal.     Breath sounds: Normal breath sounds.  Abdominal:     General: Abdomen is flat. There is no distension.     Palpations: Abdomen is soft.     Tenderness: There is abdominal tenderness. There is no right CVA tenderness, left CVA tenderness, guarding or rebound.     Comments: Suprapubic tenderness around catheter insertion site.   Genitourinary:    Penis: Circumcised. Erythema and discharge present. No tenderness or swelling.      Testes:        Right: Tenderness or swelling not present.        Left: Tenderness or swelling not present.     Epididymis:     Right: No tenderness.     Left: No tenderness.  Comments: Suprapubic catheter noted to patient abdomen. No discharge or drainage noted from site. There is erythema surrounding the site.  Musculoskeletal:     Cervical back: Normal range of motion and neck supple. No rigidity or tenderness.  Neurological:     Mental Status: He is alert.    ED Results / Procedures / Treatments   Labs (all labs ordered are listed, but only abnormal results are displayed) Labs Reviewed  COMPREHENSIVE METABOLIC PANEL - Abnormal; Notable for the following components:      Result Value   Glucose, Bld 111 (*)    BUN 33 (*)    Albumin 3.2 (*)    AST 52 (*)    ALT 69 (*)    All other components within normal limits  URINALYSIS, ROUTINE W REFLEX MICROSCOPIC - Abnormal; Notable for the following components:   Color, Urine AMBER (*)    APPearance TURBID (*)    All other components within normal limits  URINALYSIS, MICROSCOPIC (REFLEX) - Abnormal; Notable for the following components:   Bacteria, UA MANY (*)    All other components within normal limits  URINE CULTURE  CBC WITH DIFFERENTIAL/PLATELET    EKG None  Radiology No results found.  Procedures Procedures    Medications Ordered in ED Medications  ciprofloxacin (CIPRO) IVPB 400 mg (400 mg Intravenous New  Bag/Given 05/24/21 1340)  methocarbamol (ROBAXIN) 500 mg in dextrose 5 % 50 mL IVPB (0 mg Intravenous Stopped 05/24/21 1239)  sodium chloride 0.9 % bolus 1,000 mL (1,000 mLs Intravenous Bolus 05/24/21 1200)    ED Course/ Medical Decision Making/ A&P                           Medical Decision Making   80 year old male with left-sided hemiparesis due to stroke presents due to issue with suprapubic catheter.  Patient had suprapubic catheter changed on 12/20 and since this time has had issues with urination.  On examination, there is a significant odor significant for a UTI.  Patient has pus draining from suprapubic catheter site as well as penis.  There is abdominal tenderness.  Patient denies any fever, nausea, vomiting or diarrhea.  Patient is afebrile here.  Patient suprapubic catheter site is not draining, his urine is being expressed through his penis.  I have began a 1 liter of fluid on this patient and given him a dose of ciprofloxacin.   Patient is currently being seen by Dr. Lovena Neighbours of [facility].  Made contact with patient urologist who stated, "Does he have a 17 still in? It looks like we replaced his S/P tube in office on 05/10/21. He may need it upsized, or just the tube exchanged if he has a UTI and a lot of sediment and blood.  You could also have the nurse flush the S/P tube and see if that clears it, if not and his tube is not draining, you could probably replace the tube with a 14 or 16".  I have directed nurse to try and flush this patient's suprapubic catheter. This was unsuccessful.   I consulted interventional radiology who placed initial SP catheter. IR states that they will take him to fluoroscopy suite later this afternoon with plan to replace SP catheter and ensure it is positioned correctly. This plan has been explained to the patient who voices agreement with this plan. The patient has been handed off at my end of shift to Suella Broad PA-C who will continue to  care for  patient. At this point, plan is to have new SP catheter placed by IR and then plan for patient to be discharged on oral antibiotics for UTI. Plan has been discussed with oncoming provider.    Final Clinical Impression(s) / ED Diagnoses Final diagnoses:  Cystitis  Suprapubic catheter dysfunction, initial encounter [facility])    Rx / DC Orders ED Discharge Orders     None         Azucena Cecil, PA-C 05/24/21 1519    Pattricia Boss, MD 05/25/21 1426

## 2021-05-24 NOTE — ED Provider Notes (Signed)
Plan is for IR to replace suprapubic catheter. Initially placed in November, sees Dr. Lovena Neighbours with [facility]. Unable to irrigate at bedside.  Physical Exam  BP 111/62    Pulse (!) 52    Temp 97.9 F (36.6 C) (Oral)    Resp (!) 25    SpO2 99%   Physical Exam  ED Course/Procedures     Procedures  MDM  Plan for IR change catheter. Given IV abx. If exchanged, plan for dc home on Cipro. Successful catheter exchange, patient feeling improved. Discharge to [facility] with rx for Cipro.      Tacy Learn, PA-C 05/24/21 1700    Pattricia Boss, MD 05/25/21 1426

## 2021-05-24 NOTE — ED Notes (Signed)
[facility] was called, 3-4 ahead of PT

## 2021-05-24 NOTE — ED Triage Notes (Signed)
Pt BIB EMS from [facility]. Per EMS pt reports painful urination and decreased output for the last 5-6 days - Pt has a catheter in place but facility reports some leaking. Pt also reports spasms in genital area with 10/10 pain  PT has hx of UTI and stroke (contracted on left side)  VSS with EMS

## 2021-05-24 NOTE — ED Notes (Signed)
Attempted to flush suprapubic foley and with multiple attempts no saline would flush through.  The insertion site drains pus and the saline just splashes out of the top where the flush is inserted

## 2021-05-24 NOTE — ED Notes (Signed)
PA at bedside.

## 2021-05-24 NOTE — ED Notes (Addendum)
Pt catheter site is leaking - redness, drainage, and blood noted - pt reporting tenderness to touch- MD and PA made aware

## 2021-05-24 NOTE — Procedures (Signed)
Interventional Radiology Procedure Note  Procedure: Exchange of suprapubic tube under fluoroscopy  Complications: None  Estimated Blood Loss: None  Findings: Current SP tube occluded and suspected to be in tract just outside bladder. After tube removal and guidewire advancement into bladder, 16 Fr Council-tip Foley catheter advanced into bladder with good return of foul-smelling urine and debris. Retention balloon inflated with 10 mL of saline. Attached to new gravity bag.  Venetia Night. Kathlene Cote, M.D Pager:  [phone]

## 2021-05-24 NOTE — Discharge Instructions (Signed)
Cipro as prescribed.  Follow-up with urology.

## 2021-05-24 NOTE — ED Notes (Signed)
PA at bedside. Pt groin, genital area is getting more red and swollen

## 2021-05-24 NOTE — ED Notes (Signed)
MD to bedside with changing pt because pt continues to urinate through his penis not the suprapubic his genitals and lower abdominal area is increasing in inflammation.  NT on bladder scan got zero.

## 2021-05-25 LAB — URINE CULTURE

## 2021-05-25 NOTE — Progress Notes (Signed)
End of Treatment Note  Diagnosis:C44.319 - Basal cell carcinoma of skin of other parts of face, Diagnosed 03/08/2021 (Active)  Cancer Staging:  Cancer Staging  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 Staging form: Cutaneous Carcinoma of the Head and Neck, AJCC 8th Edition - Clinical stage from 03/08/2021: Stage II (cT2, cN0, cM0) - Signed by Eppie Gibson, MD on 03/08/2021 Extraosseous extension: Absent   Indication for Treatment: curative  Radiation Treatment Dates: 03-25-21 to 04-18-21  Total Dose:  22Gy out of a planned 44 Gy Dose per Fraction:  4.4Gy Number of Fractions:  5 of 10 planned fractions   Technique: electrons  Energy: 6MeV  Narrative: The patient tolerated radiation therapy relatively well. However, he elected to stop therapy prematurely against medical advice.  Plan:  He will followup with rad/onc PRN. Dr. Everett Graff office has been informed by our navigator of the above. -----------------------------------  Eppie Gibson, MD

## 2021-06-01 ENCOUNTER — Other Ambulatory Visit: Payer: Self-pay

## 2021-06-01 ENCOUNTER — Emergency Department (HOSPITAL_COMMUNITY)
Admission: EM | Admit: 2021-06-01 | Discharge: 2021-06-01 | Disposition: A | Discharge status: Home / Self Care | Payer: Medicare Other | Attending: Emergency Medicine | Admitting: Emergency Medicine

## 2021-06-01 DIAGNOSIS — R1030 Lower abdominal pain, unspecified: Secondary | ICD-10-CM | POA: Insufficient documentation

## 2021-06-01 DIAGNOSIS — T83010A Breakdown (mechanical) of cystostomy catheter, initial encounter: Secondary | ICD-10-CM

## 2021-06-01 DIAGNOSIS — T83098A Other mechanical complication of other indwelling urethral catheter, initial encounter: Secondary | ICD-10-CM | POA: Insufficient documentation

## 2021-06-01 DIAGNOSIS — Z7902 Long term (current) use of antithrombotics/antiplatelets: Secondary | ICD-10-CM | POA: Diagnosis not present

## 2021-06-01 MED ORDER — OXYCODONE-ACETAMINOPHEN 5-325 MG PO TABS
1.0000 | ORAL_TABLET | Freq: Once | ORAL | Status: AC
  Administered 2021-06-01: 1 via ORAL
  Filled 2021-06-01: qty 1

## 2021-06-01 NOTE — ED Notes (Addendum)
Pt had episode of urination from urethra, no urine noted in the drainage bag from suprapubic catheter

## 2021-06-01 NOTE — ED Triage Notes (Addendum)
Pt here via [facility] from [facility] for suprapubic catheter displacement. Per staff pt was dx w/ UTI on 1/3, is on antibiotics. Staff went to place a new catheter today and had difficulty advancing catheter, they tried to take it out but pt was screaming in pain, so they left it in. Aox4, hx stroke w/ L side deficits. 136/80, 94% RA, 84HR, pt has no complaints.

## 2021-06-01 NOTE — ED Notes (Signed)
Provider and RN at bedside for suprapubic catheter replacement.

## 2021-06-01 NOTE — ED Notes (Signed)
PTAR called by orange sec. Report given to [place] RN in green. Report to [place] attempted.

## 2021-06-01 NOTE — ED Provider Notes (Signed)
[facility] EMERGENCY DEPARTMENT Provider Note   CSN: [id number] Arrival date & time: 06/01/21  1433     History  No chief complaint on file.   Gregory Kerr is a 81 y.o. male.  Patient presents from [facility] for concern of suprapubic catheter displacement/dysfunction.  Per staff patient has been on antibiotics for urine infection since January 3.  Staff went to place a new catheter today and had difficulty advancing and patient was in pain during attempt.  Patient has history of significant stroke with deficits and bedbound.  Patient [place] any fevers or vomiting.  No abdominal pain.      Home Medications Prior to Admission medications   Medication Sig Start Date End Date Taking? Authorizing Provider  atorvastatin (LIPITOR) 40 MG tablet Take 1 tablet (40 mg total) by mouth daily. Patient taking differently: Take 40 mg by mouth every evening. 11/22/20   Madalyn Rob, MD  baclofen (LIORESAL) 20 MG tablet Take 20 mg by mouth 3 (three) times daily.    [provider]  bisacodyl (DULCOLAX) 10 MG suppository Place 10 mg rectally as needed for moderate constipation.    [provider]  calcium-vitamin D (OSCAL WITH D) 500-200 MG-UNIT tablet Take 1 tablet by mouth 2 (two) times daily.    [provider]  ciclopirox (PENLAC) 8 % solution Apply 1 application topically daily. Paint toenails with solution once daily    [provider]  clopidogrel (PLAVIX) 75 MG tablet Take 1 tablet (75 mg total) by mouth daily. Patient not taking: Reported on 04/18/2021 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 To right shoulder    [provider]  finasteride (PROSCAR) 5 MG tablet Take 5 mg by mouth daily. 11/08/20    [provider]  gabapentin (NEURONTIN) 100 MG capsule Take 100 mg by mouth 3 (three) times daily.    [provider]  hydroxypropyl methylcellulose / hypromellose (ISOPTO TEARS / GONIOVISC) 2.5 % ophthalmic solution Place 1 drop into both eyes.    [provider]  lisinopril (ZESTRIL) 10 MG tablet Take 10 mg by mouth daily.    [provider]  loperamide (IMODIUM A-D) 2 MG tablet Take 4 mg by mouth 4 (four) times daily as needed for diarrhea or loose stools.    [provider]  loratadine (CLARITIN) 10 MG tablet Take 10 mg by mouth daily.    [provider]  nitroGLYCERIN (NITROSTAT) 0.4 MG SL tablet Place 0.4 mg under the tongue every 5 (five) minutes as needed for chest pain.    [provider]  Nutritional Supplements (BOOST PO) Take 1 Container by mouth daily. Lactose-Reduced 0..04 g/1 kcal/ml liquid    [provider]  pantoprazole (PROTONIX) 20 MG tablet Take 20 mg by mouth daily. 11/17/20   [provider]  polyethylene glycol (MIRALAX / GLYCOLAX) packet Take 17 g by mouth daily as needed (constipation).     [provider]  polyvinyl alcohol (LIQUIFILM TEARS) 1.4 % ophthalmic solution Place 1 drop into both eyes 3 (three) times daily.    [provider]  Prenatal Vit-Fe Fumarate-FA (PREPLUS) 27-1 MG TABS Take 1 tablet by mouth daily.     [provider]  PROLIA 60 MG/ML SOSY injection  Inject 60 mg into the skin every 6 (six) months. 09/07/20   [provider]  sertraline (ZOLOFT) 100 MG tablet Take 100 mg by mouth daily.    [provider]  solifenacin (VESICARE) 5 MG tablet Take 1 tablet (5 mg total) by mouth daily. Patient not taking: Reported on 04/18/2021 11/21/20   Madalyn Rob, MD  tamsulosin (FLOMAX) 0.4 MG CAPS capsule Take 1 capsule (0.4 mg total) by mouth at bedtime. Patient taking differently: Take 0.4 mg by mouth 2 (two) times daily. 11/21/20   Madalyn Rob, MD   traMADol (ULTRAM) 50 MG tablet Take 50 mg by mouth See admin instructions. Take 50 mg twice daily, may take a 3rd 50 mg dose as needed for pain    [provider]      Allergies    Penicillins    Review of Systems   Review of Systems  Constitutional:  Negative for chills and fever.  HENT:  Negative for congestion.   Eyes:  Negative for visual disturbance.  Respiratory:  Negative for shortness of breath.   Cardiovascular:  Negative for chest pain.  Gastrointestinal:  Negative for abdominal pain and vomiting.  Genitourinary:  Negative for dysuria and flank pain.  Musculoskeletal:  Negative for back pain, neck pain and neck stiffness.  Skin:  Negative for rash.  Neurological:  Negative for light-headedness and headaches.   Physical Exam Updated Vital Signs BP (!) 141/75    Pulse 84    Temp 98.9 F (37.2 C) (Oral)    Resp 15    SpO2 94%  Physical Exam Vitals and nursing note reviewed.  Constitutional:      General: He is not in acute distress.    Appearance: He is well-developed.  HENT:     Head: Normocephalic and atraumatic.     Mouth/Throat:     Mouth: Mucous membranes are moist.  Eyes:     General:        Right eye: No discharge.        Left eye: No discharge.     Conjunctiva/sclera: Conjunctivae normal.  Neck:     Trachea: No tracheal deviation.  Cardiovascular:     Rate and Rhythm: Normal rate.  Pulmonary:     Effort: Pulmonary effort is normal.  Abdominal:     General: There is no distension.     Palpations: Abdomen is soft.     Tenderness: There is no abdominal tenderness. There is no guarding.  Genitourinary:    Comments: Patient has mild erythema and warmth anterior scrotum and penis, suprapubic catheter without surrounding erythema or induration, minimal output in the bag. Musculoskeletal:        General: No swelling.     Cervical back: Normal range of motion and neck supple.  Skin:    General: Skin is warm.     Capillary Refill: Capillary refill  takes less than 2 seconds.     Findings: Rash present.  Neurological:     Mental Status: He is alert and oriented to person, place, and time.     Comments: Patient answers questions appropriate, general weakness on exam.  Psychiatric:        Mood and Affect: Mood normal.    ED Results / Procedures / Treatments   Labs (all labs ordered are listed, but only abnormal results are displayed) Labs Reviewed - No data to display  EKG None  Radiology No results found.  Procedures BLADDER CATHETERIZATION  Date/Time: 06/01/2021 4:09 PM Performed by:  Elnora Morrison, MD Authorized by: Elnora Morrison, MD   Consent:    Consent obtained:  Verbal   Consent given by:  Patient   Risks, benefits, and alternatives were discussed: yes     Risks discussed:  False passage, infection, urethral injury, pain and incomplete procedure   Alternatives discussed:  No treatment Universal protocol:    Procedure explained and questions answered to patient or proxy's satisfaction: yes     Patient identity confirmed:  Verbally with patient Pre-procedure details:    Procedure purpose:  Therapeutic   Preparation: Patient was prepped and draped in usual sterile fashion   Anesthesia:    Anesthesia method:  None Procedure details:    Provider performed due to:  Complicated insertion   Catheter type:  Triple lumen   Catheter size:  16 Fr   Bladder irrigation: no     Number of attempts:  1   Urine characteristics:  Mildly cloudy Post-procedure details:    Procedure completion:  Tolerated Comments:     16 F suprapubic catheter placed and exchanged    Medications Ordered in ED Medications  oxyCODONE-acetaminophen (PERCOCET/ROXICET) 5-325 MG per tablet 1 tablet (has no administration in time range)    ED Course/ Medical Decision Making/ A&P                           Medical Decision Making  Patient with known stroke history and currently being treated for urine infection presents for suprapubic  catheter dysfunction.  No significant drainage on review at bedside.  Nursing to obtain size and we will attempt to exchange here.  Pain meds ordered.  Urine culture to be sent.  Patient has no vomiting, abdominal or flank pain or fevers to suggest kidney infection or more significant pathology.  We will hold blood work at this time.  With nursing assistance suprapubic 44F Foley catheter exchanged without difficulty.  Urine flowing normally. Plan discussed with nursing facility for transfer back.         Final Clinical Impression(s) / ED Diagnoses Final diagnoses:  Suprapubic catheter dysfunction, initial encounter [facility])    Rx / DC Orders ED Discharge Orders     None         Elnora Morrison, MD 06/01/21 1611

## 2021-06-01 NOTE — Discharge Instructions (Addendum)
Return for new concerns such as fever, abdominal pain, no urine output, vomiting or new concerns.

## 2021-06-01 NOTE — ED Notes (Signed)
[facility] called for transport to [facility]

## 2021-06-28 ENCOUNTER — Inpatient Hospital Stay (HOSPITAL_COMMUNITY)
Admission: EM | Admit: 2021-06-28 | Discharge: 2021-07-02 | DRG: 698 | Disposition: A | Discharge status: Skilled Nursing Facility | Payer: Medicare Other | Source: Skilled Nursing Facility | Attending: Internal Medicine | Admitting: Internal Medicine

## 2021-06-28 ENCOUNTER — Emergency Department (HOSPITAL_COMMUNITY): Payer: Medicare Other

## 2021-06-28 DIAGNOSIS — I69359 Hemiplegia and hemiparesis following cerebral infarction affecting unspecified side: Secondary | ICD-10-CM | POA: Diagnosis not present

## 2021-06-28 DIAGNOSIS — N3289 Other specified disorders of bladder: Secondary | ICD-10-CM | POA: Diagnosis present

## 2021-06-28 DIAGNOSIS — N1831 Acute kidney failure, unspecified: Secondary | ICD-10-CM | POA: Diagnosis present

## 2021-06-28 DIAGNOSIS — Z7401 Bed confinement status: Secondary | ICD-10-CM

## 2021-06-28 DIAGNOSIS — N320 Bladder-neck obstruction: Secondary | ICD-10-CM | POA: Diagnosis present

## 2021-06-28 DIAGNOSIS — N179 Acute kidney failure, unspecified: Secondary | ICD-10-CM | POA: Diagnosis present

## 2021-06-28 DIAGNOSIS — I130 Hypertensive heart and chronic kidney disease with heart failure and stage 1 through stage 4 chronic kidney disease, or unspecified chronic kidney disease: Secondary | ICD-10-CM | POA: Diagnosis present

## 2021-06-28 DIAGNOSIS — R252 Cramp and spasm: Secondary | ICD-10-CM | POA: Diagnosis present

## 2021-06-28 DIAGNOSIS — E785 Hyperlipidemia, unspecified: Secondary | ICD-10-CM | POA: Diagnosis present

## 2021-06-28 DIAGNOSIS — R32 Unspecified urinary incontinence: Secondary | ICD-10-CM | POA: Diagnosis present

## 2021-06-28 DIAGNOSIS — K5900 Constipation, unspecified: Secondary | ICD-10-CM | POA: Diagnosis not present

## 2021-06-28 DIAGNOSIS — Z87891 Personal history of nicotine dependence: Secondary | ICD-10-CM | POA: Diagnosis not present

## 2021-06-28 DIAGNOSIS — I5032 Chronic diastolic (congestive) heart failure: Secondary | ICD-10-CM | POA: Diagnosis present

## 2021-06-28 DIAGNOSIS — Z9049 Acquired absence of other specified parts of digestive tract: Secondary | ICD-10-CM | POA: Diagnosis not present

## 2021-06-28 DIAGNOSIS — R9431 Abnormal electrocardiogram [ECG] [EKG]: Secondary | ICD-10-CM | POA: Diagnosis present

## 2021-06-28 DIAGNOSIS — Z7902 Long term (current) use of antithrombotics/antiplatelets: Secondary | ICD-10-CM

## 2021-06-28 DIAGNOSIS — R531 Weakness: Secondary | ICD-10-CM

## 2021-06-28 DIAGNOSIS — I69354 Hemiplegia and hemiparesis following cerebral infarction affecting left non-dominant side: Secondary | ICD-10-CM | POA: Diagnosis not present

## 2021-06-28 DIAGNOSIS — T83518A Infection and inflammatory reaction due to other urinary catheter, initial encounter: Principal | ICD-10-CM | POA: Diagnosis present

## 2021-06-28 DIAGNOSIS — A419 Sepsis, unspecified organism: Secondary | ICD-10-CM | POA: Diagnosis present

## 2021-06-28 DIAGNOSIS — Z20822 Contact with and (suspected) exposure to covid-19: Secondary | ICD-10-CM | POA: Diagnosis present

## 2021-06-28 DIAGNOSIS — K5641 Fecal impaction: Secondary | ICD-10-CM | POA: Diagnosis present

## 2021-06-28 DIAGNOSIS — K219 Gastro-esophageal reflux disease without esophagitis: Secondary | ICD-10-CM | POA: Diagnosis present

## 2021-06-28 DIAGNOSIS — Y738 Miscellaneous gastroenterology and urology devices associated with adverse incidents, not elsewhere classified: Secondary | ICD-10-CM | POA: Diagnosis present

## 2021-06-28 DIAGNOSIS — D649 Anemia, unspecified: Secondary | ICD-10-CM | POA: Diagnosis present

## 2021-06-28 DIAGNOSIS — N401 Enlarged prostate with lower urinary tract symptoms: Secondary | ICD-10-CM | POA: Diagnosis present

## 2021-06-28 DIAGNOSIS — R6520 Severe sepsis without septic shock: Secondary | ICD-10-CM | POA: Diagnosis present

## 2021-06-28 DIAGNOSIS — I10 Essential (primary) hypertension: Secondary | ICD-10-CM | POA: Diagnosis present

## 2021-06-28 DIAGNOSIS — R000 Tachycardia, unspecified: Secondary | ICD-10-CM | POA: Diagnosis present

## 2021-06-28 DIAGNOSIS — N390 Urinary tract infection, site not specified: Secondary | ICD-10-CM | POA: Diagnosis present

## 2021-06-28 DIAGNOSIS — Z79899 Other long term (current) drug therapy: Secondary | ICD-10-CM | POA: Diagnosis not present

## 2021-06-28 DIAGNOSIS — Z8249 Family history of ischemic heart disease and other diseases of the circulatory system: Secondary | ICD-10-CM

## 2021-06-28 LAB — COMPREHENSIVE METABOLIC PANEL
ALT: 33 U/L (ref 0–44)
AST: 33 U/L (ref 15–41)
Albumin: 3.2 g/dL — ABNORMAL LOW (ref 3.5–5.0)
Alkaline Phosphatase: 78 U/L (ref 38–126)
Anion gap: 13 (ref 5–15)
BUN: 29 mg/dL — ABNORMAL HIGH (ref 8–23)
CO2: 18 mmol/L — ABNORMAL LOW (ref 22–32)
Calcium: 9.3 mg/dL (ref 8.9–10.3)
Chloride: 109 mmol/L (ref 98–111)
Creatinine, Ser: 1.08 mg/dL (ref 0.61–1.24)
GFR, Estimated: >60 mL/min (ref >60)
Glucose, Bld: 107 mg/dL — ABNORMAL HIGH (ref 70–99)
Potassium: 4.6 mmol/L (ref 3.5–5.1)
Sodium: 140 mmol/L (ref 135–145)
Total Bilirubin: 0.5 mg/dL (ref 0.3–1.2)
Total Protein: 6.9 g/dL (ref 6.5–8.1)

## 2021-06-28 LAB — CBC WITH DIFFERENTIAL/PLATELET
Abs Immature Granulocytes: 0.03 10*3/uL (ref 0.00–0.07)
Basophils Absolute: 0 10*3/uL (ref 0.0–0.1)
Basophils Relative: 0 %
Eosinophils Absolute: 0.1 10*3/uL (ref 0.0–0.5)
Eosinophils Relative: 1 %
HCT: 43 % (ref 39.0–52.0)
Hemoglobin: 14 g/dL (ref 13.0–17.0)
Immature Granulocytes: 0 %
Lymphocytes Relative: 18 %
Lymphs Abs: 2 10*3/uL (ref 0.7–4.0)
MCH: 29.2 pg (ref 26.0–34.0)
MCHC: 32.6 g/dL (ref 30.0–36.0)
MCV: 89.6 fL (ref 80.0–100.0)
Monocytes Absolute: 0.6 10*3/uL (ref 0.1–1.0)
Monocytes Relative: 5 %
Neutro Abs: 8.3 10*3/uL — ABNORMAL HIGH (ref 1.7–7.7)
Neutrophils Relative %: 76 %
Platelets: 271 10*3/uL (ref 150–400)
RBC: 4.8 MIL/uL (ref 4.22–5.81)
RDW: 15.4 % (ref 11.5–15.5)
WBC: 11.1 10*3/uL — ABNORMAL HIGH (ref 4.0–10.5)
nRBC: 0 % (ref 0.0–0.2)

## 2021-06-28 LAB — URINALYSIS, ROUTINE W REFLEX MICROSCOPIC
Bilirubin Urine: NEGATIVE
Glucose, UA: NEGATIVE mg/dL
Ketones, ur: NEGATIVE mg/dL
Nitrite: POSITIVE — AB
Protein, ur: >=300 mg/dL — AB
RBC / HPF: >50 RBC/hpf — ABNORMAL HIGH (ref 0–5)
Specific Gravity, Urine: 1.02 (ref 1.005–1.030)
WBC, UA: >50 WBC/hpf — ABNORMAL HIGH (ref 0–5)
pH: 8 (ref 5.0–8.0)

## 2021-06-28 LAB — I-STAT CHEM 8, ED
BUN: 29 mg/dL — ABNORMAL HIGH (ref 8–23)
Calcium, Ion: 1.23 mmol/L (ref 1.15–1.40)
Chloride: 110 mmol/L (ref 98–111)
Creatinine, Ser: 0.9 mg/dL (ref 0.61–1.24)
Glucose, Bld: 101 mg/dL — ABNORMAL HIGH (ref 70–99)
HCT: 42 % (ref 39.0–52.0)
Hemoglobin: 14.3 g/dL (ref 13.0–17.0)
Potassium: 4.4 mmol/L (ref 3.5–5.1)
Sodium: 142 mmol/L (ref 135–145)
TCO2: 22 mmol/L (ref 22–32)

## 2021-06-28 LAB — LACTIC ACID, PLASMA: Lactic Acid, Venous: 4.5 mmol/L (ref 0.5–1.9)

## 2021-06-28 LAB — LIPASE, BLOOD: Lipase: 32 U/L (ref 11–51)

## 2021-06-28 LAB — PROTIME-INR
INR: 1.1 (ref 0.8–1.2)
Prothrombin Time: 13.9 seconds (ref 11.4–15.2)

## 2021-06-28 LAB — CREATININE, URINE, RANDOM: Creatinine, Urine: 87.02 mg/dL

## 2021-06-28 LAB — RESP PANEL BY RT-PCR (FLU A&B, COVID) ARPGX2
Influenza A by PCR: NEGATIVE
Influenza B by PCR: NEGATIVE
SARS Coronavirus 2 by RT PCR: NEGATIVE

## 2021-06-28 LAB — APTT: aPTT: 29 seconds (ref 24–36)

## 2021-06-28 LAB — OSMOLALITY, URINE: Osmolality, Ur: 470 mOsm/kg (ref 300–900)

## 2021-06-28 LAB — SODIUM, URINE, RANDOM: Sodium, Ur: 107 mmol/L

## 2021-06-28 MED ORDER — IOHEXOL 300 MG/ML  SOLN
80.0000 mL | Freq: Once | INTRAMUSCULAR | Status: AC | PRN
  Administered 2021-06-28: 80 mL via INTRAVENOUS

## 2021-06-28 MED ORDER — PHENAZOPYRIDINE HCL 100 MG PO TABS
100.0000 mg | ORAL_TABLET | Freq: Three times a day (TID) | ORAL | Status: DC | PRN
  Administered 2021-06-30 – 2021-07-02 (×3): 100 mg via ORAL
  Filled 2021-06-28 (×4): qty 1

## 2021-06-28 MED ORDER — BACLOFEN 20 MG PO TABS: 20.0000 mg | ORAL_TABLET | Freq: Three times a day (TID) | ORAL | Status: DC

## 2021-06-28 MED ORDER — DEXTROMETHORPHAN-QUINIDINE 20-10 MG PO CAPS
1.0000 | ORAL_CAPSULE | Freq: Every day | ORAL | Status: DC
  Administered 2021-06-30 – 2021-07-02 (×3): 1 via ORAL
  Filled 2021-06-28 (×3): qty 1

## 2021-06-28 MED ORDER — BISACODYL 5 MG PO TBEC: 5.0000 mg | DELAYED_RELEASE_TABLET | Freq: Every day | ORAL | Status: DC | PRN

## 2021-06-28 MED ORDER — SODIUM CHLORIDE 0.9 % IV SOLN
75.0000 mL/h | INTRAVENOUS | Status: AC
  Administered 2021-06-28: 75 mL/h via INTRAVENOUS

## 2021-06-28 MED ORDER — MILK AND MOLASSES ENEMA
1.0000 | Freq: Once | RECTAL | Status: AC
  Administered 2021-06-29: 240 mL via RECTAL
  Filled 2021-06-28: qty 240

## 2021-06-28 MED ORDER — ATORVASTATIN CALCIUM 40 MG PO TABS
40.0000 mg | ORAL_TABLET | Freq: Every evening | ORAL | Status: DC
  Administered 2021-06-29 – 2021-07-01 (×3): 40 mg via ORAL
  Filled 2021-06-28 (×3): qty 1

## 2021-06-28 MED ORDER — LACTATED RINGERS IV BOLUS (SEPSIS)
1000.0000 mL | Freq: Once | INTRAVENOUS | Status: AC
  Administered 2021-06-28: 1000 mL via INTRAVENOUS

## 2021-06-28 MED ORDER — LACTATED RINGERS IV BOLUS (SEPSIS)
500.0000 mL | Freq: Once | INTRAVENOUS | Status: AC
  Administered 2021-06-28: 500 mL via INTRAVENOUS

## 2021-06-28 MED ORDER — BISACODYL 10 MG RE SUPP
10.0000 mg | Freq: Once | RECTAL | Status: AC
  Administered 2021-06-29: 10 mg via RECTAL
  Filled 2021-06-28: qty 1

## 2021-06-28 MED ORDER — HYDROMORPHONE HCL 1 MG/ML IJ SOLN
1.0000 mg | Freq: Once | INTRAMUSCULAR | Status: AC
  Administered 2021-06-28: 1 mg via INTRAVENOUS
  Filled 2021-06-28: qty 1

## 2021-06-28 MED ORDER — HYDROMORPHONE HCL 1 MG/ML IJ SOLN
1.0000 mg | Freq: Once | INTRAMUSCULAR | Status: AC
Start: 2021-06-28 — End: 2021-06-28
  Administered 2021-06-28: 1 mg via INTRAVENOUS
  Filled 2021-06-28: qty 1

## 2021-06-28 MED ORDER — GABAPENTIN 100 MG PO CAPS
100.0000 mg | ORAL_CAPSULE | Freq: Three times a day (TID) | ORAL | Status: DC
  Administered 2021-06-28 – 2021-07-02 (×11): 100 mg via ORAL
  Filled 2021-06-28 (×11): qty 1

## 2021-06-28 MED ORDER — VANCOMYCIN HCL IN DEXTROSE 1-5 GM/200ML-% IV SOLN
1000.0000 mg | Freq: Once | INTRAVENOUS | Status: AC
  Administered 2021-06-28: 1000 mg via INTRAVENOUS
  Filled 2021-06-28: qty 200

## 2021-06-28 MED ORDER — CLOPIDOGREL BISULFATE 75 MG PO TABS
75.0000 mg | ORAL_TABLET | Freq: Every day | ORAL | Status: DC
  Administered 2021-06-29 – 2021-07-02 (×4): 75 mg via ORAL
  Filled 2021-06-28 (×4): qty 1

## 2021-06-28 MED ORDER — ACETAMINOPHEN 325 MG PO TABS
650.0000 mg | ORAL_TABLET | Freq: Once | ORAL | Status: AC
Start: 2021-06-28 — End: 2021-06-28
  Administered 2021-06-28: 650 mg via ORAL
  Filled 2021-06-28: qty 2

## 2021-06-28 MED ORDER — TRAMADOL HCL 50 MG PO TABS: 50.0000 mg | ORAL_TABLET | ORAL | Status: DC

## 2021-06-28 MED ORDER — PANTOPRAZOLE SODIUM 20 MG PO TBEC
20.0000 mg | DELAYED_RELEASE_TABLET | Freq: Every day | ORAL | Status: DC
Start: 2021-06-29 — End: 2021-07-02
  Administered 2021-06-29 – 2021-07-02 (×4): 20 mg via ORAL
  Filled 2021-06-28 (×4): qty 1

## 2021-06-28 MED ORDER — ACETAMINOPHEN 325 MG PO TABS: 650.0000 mg | ORAL_TABLET | Freq: Four times a day (QID) | ORAL | Status: DC | PRN

## 2021-06-28 MED ORDER — SODIUM CHLORIDE 0.9 % IV SOLN
2.0000 g | Freq: Three times a day (TID) | INTRAVENOUS | Status: DC
  Administered 2021-06-28 – 2021-06-30 (×6): 2 g via INTRAVENOUS
  Filled 2021-06-28 (×6): qty 2

## 2021-06-28 MED ORDER — MIRABEGRON ER 25 MG PO TB24
25.0000 mg | ORAL_TABLET | Freq: Every evening | ORAL | Status: DC
  Administered 2021-06-30 – 2021-07-01 (×2): 25 mg via ORAL
  Filled 2021-06-28 (×4): qty 1

## 2021-06-28 MED ORDER — SODIUM CHLORIDE 0.9 % IV BOLUS
500.0000 mL | Freq: Once | INTRAVENOUS | Status: AC
  Administered 2021-06-28: 500 mL via INTRAVENOUS

## 2021-06-28 MED ORDER — BACLOFEN 20 MG PO TABS
20.0000 mg | ORAL_TABLET | Freq: Three times a day (TID) | ORAL | Status: DC
  Administered 2021-06-29 – 2021-07-02 (×11): 20 mg via ORAL
  Filled 2021-06-28 (×12): qty 1

## 2021-06-28 MED ORDER — ACETAMINOPHEN 650 MG RE SUPP: 650.0000 mg | Freq: Four times a day (QID) | RECTAL | Status: DC | PRN

## 2021-06-28 MED ORDER — HYDROCODONE-ACETAMINOPHEN 5-325 MG PO TABS
1.0000 | ORAL_TABLET | ORAL | Status: DC | PRN
  Administered 2021-07-01: 2 via ORAL
  Filled 2021-06-28: qty 2

## 2021-06-28 MED ORDER — LACTATED RINGERS IV SOLN: INTRAVENOUS | Status: DC

## 2021-06-28 MED ORDER — TRAMADOL HCL 50 MG PO TABS
50.0000 mg | ORAL_TABLET | Freq: Two times a day (BID) | ORAL | Status: DC
  Administered 2021-06-28 – 2021-07-02 (×8): 50 mg via ORAL
  Filled 2021-06-28 (×8): qty 1

## 2021-06-28 MED ORDER — TRAMADOL HCL 50 MG PO TABS: 50.0000 mg | ORAL_TABLET | Freq: Every day | ORAL | Status: DC | PRN

## 2021-06-28 NOTE — Progress Notes (Signed)
Code Sepsis monitoring discontinued due to cancellation.  

## 2021-06-28 NOTE — Progress Notes (Signed)
Pharmacy Antibiotic Note  Gregory Kerr is a 81 y.o. male admitted on 06/28/2021 with sepsis.  Pharmacy has been consulted for cefepime dosing.  Plan: Cefepime IV 2g q8h Trend WBC, temp, renal function (dose adjustment required for CrCl <60, patient current CrCl=61) F/U infectious work-up and narrowing of antibiotic regimen   Height: 5\' 6"  (167.6 cm) Weight: 74.8 kg (165 lb) IBW/kg (Calculated) : 63.8  Temp (24hrs), Avg:100.3 F (37.9 C), Min:100.3 F (37.9 C), Max:100.3 F (37.9 C)  Recent Labs  Lab 06/28/21 1403 06/28/21 1531  WBC 11.1*  --   CREATININE 1.08 0.90  LATICACIDVEN 4.5*  --     Estimated Creatinine Clearance: 61 mL/min (by C-G formula based on SCr of 0.9 mg/dL).    Allergies  Allergen Reactions   Penicillins Rash    ++++Patient in nursing home. Unable to clarify details/severity of the reaction.++++    Antimicrobials this admission: 2/7 Vancomycin x1  Dose adjustments this admission: None  Microbiology results: 2/7 BCx: IP 2/7 UCx: IP   Thank you for allowing pharmacy to be a part of this patients care.  Joseph Art, Pharm.D. PGY-1 Pharmacy Resident [phone] 06/28/2021 10:40 PM

## 2021-06-28 NOTE — ED Provider Notes (Signed)
Pt's care assumeded at 3:30 pm from [facility].  Pt is a resident of [facility]. He has a history of cva and has a suprapubic catheter due to chronic incontinent.  Pt sent here today due to fever and foul urine.  Pt had purulent drainage in bag.  Pt given IV fluids and antibiotics per Sepsis criteria.  Pt had prolonged evaluation due to chemistry delay and no urine output.   Rn reports no urine output.   I replaced suprapubic catheter and was able to obtain UA.   UA shows greater than 50 wbc's and many bacteria.  Ct abdomen shows impaction, enlarged prostate and bladder wall thickening.    I spoke with Hospitalist who will admit       Gregory Kerr 06/28/21 2215    Regan Lemming, MD 06/28/21 2317

## 2021-06-28 NOTE — Assessment & Plan Note (Signed)
Order bowel regime

## 2021-06-28 NOTE — Assessment & Plan Note (Signed)
restart home meds 

## 2021-06-28 NOTE — Assessment & Plan Note (Signed)
-   will monitor on tele avoid QT prolonging medications, rehydrate correct electrolytes ? ?

## 2021-06-28 NOTE — ED Provider Notes (Signed)
[facility] EMERGENCY DEPARTMENT Provider Note   CSN: [id number] Arrival date & time: 06/28/21  1340     History  Chief Complaint  Patient presents with   Abdominal Pain    Gregory Kerr is a 81 y.o. male with a past medical history of CVA, indwelling suprapubic catheter, congestive heart failure, hypertension and hyperlipidemia presenting today with pain.  He says that over the past week he has been experiencing abdominal pain that has been worsening.  Also with severe pain to his penis.  Denies any nausea or vomiting.  No changes in his bowel movements.  He reports that he has an indwelling suprapubic catheter because he was wetting the bed in his SNF.  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  Has had a cholecystectomy however no other abdominal surgeries.   Home Medications Prior to Admission medications   Medication Sig Start Date End Date Taking? Authorizing Provider  atorvastatin (LIPITOR) 40 MG tablet Take 1 tablet (40 mg total) by mouth daily. Patient taking differently: Take 40 mg by mouth every evening. 11/22/20   Madalyn Rob, MD  baclofen (LIORESAL) 20 MG tablet Take 20 mg by mouth 3 (three) times daily.    [provider]  bisacodyl (DULCOLAX) 10 MG suppository Place 10 mg rectally as needed for moderate constipation.    [provider]  calcium-vitamin D (OSCAL WITH D) 500-200 MG-UNIT tablet Take 1 tablet by mouth 2 (two) times daily.    [provider]  ciclopirox (PENLAC) 8 % solution Apply 1 application topically daily. Paint toenails with solution once daily    [provider]  clopidogrel (PLAVIX) 75 MG tablet Take 1 tablet (75 mg total) by mouth daily. Patient not taking: Reported on 04/18/2021 11/22/20   Madalyn Rob, MD  Dextromethorphan-quiNIDine (NUEDEXTA) 20-10 MG capsule Take 1 capsule by mouth daily. For Pseudobulbar Affect (PBA)    [provider]  diclofenac Sodium  (VOLTAREN) 1 % GEL Apply 2 g topically in the morning, at noon, and at bedtime. To right shoulder    [provider]  finasteride (PROSCAR) 5 MG tablet Take 5 mg by mouth daily. 11/08/20   [provider]  gabapentin (NEURONTIN) 100 MG capsule Take 100 mg by mouth 3 (three) times daily.    [provider]  hydroxypropyl methylcellulose / hypromellose (ISOPTO TEARS / GONIOVISC) 2.5 % ophthalmic solution Place 1 drop into both eyes.    [provider]  lisinopril (ZESTRIL) 10 MG tablet Take 10 mg by mouth daily.    [provider]  loperamide (IMODIUM A-D) 2 MG tablet Take 4 mg by mouth 4 (four) times daily as needed for diarrhea or loose stools.    [provider]  loratadine (CLARITIN) 10 MG tablet Take 10 mg by mouth daily.    [provider]  nitroGLYCERIN (NITROSTAT) 0.4 MG SL tablet Place 0.4 mg under the tongue every 5 (five) minutes as needed for chest pain.    [provider]  Nutritional Supplements (BOOST PO) Take 1 Container by mouth daily. Lactose-Reduced 0..04 g/1 kcal/ml liquid    [provider]  pantoprazole (PROTONIX) 20 MG tablet Take 20 mg by mouth daily. 11/17/20   [provider]  polyethylene glycol (MIRALAX / GLYCOLAX) packet Take 17 g by mouth daily as needed (constipation).     [provider]  polyvinyl alcohol (LIQUIFILM TEARS) 1.4 % ophthalmic solution Place 1 drop into both eyes 3 (  three) times daily.    [provider]  Prenatal Vit-Fe Fumarate-FA (PREPLUS) 27-1 MG TABS Take 1 tablet by mouth daily.     [provider]  PROLIA 60 MG/ML SOSY injection Inject 60 mg into the skin every 6 (six) months. 09/07/20   [provider]  sertraline (ZOLOFT) 100 MG tablet Take 100 mg by mouth daily.    [provider]  solifenacin (VESICARE) 5 MG tablet Take 1 tablet (5 mg total) by mouth daily. Patient not taking: Reported on 04/18/2021 11/21/20   Madalyn Rob, MD  tamsulosin (FLOMAX) 0.4 MG CAPS capsule Take 1 capsule (0.4 mg total) by mouth at bedtime. Patient taking differently: Take 0.4 mg by mouth 2 (two) times daily. 11/21/20   Madalyn Rob, MD  traMADol (ULTRAM) 50 MG tablet Take 50 mg by mouth See admin instructions. Take 50 mg twice daily, may take a 3rd 50 mg dose as needed for pain    [provider]      Allergies    Penicillins    Review of Systems   Review of Systems  Gastrointestinal:  Positive for abdominal pain.  See HPI  Physical Exam Updated Vital Signs There were no vitals taken for this visit. Physical Exam Vitals and nursing note reviewed.  Constitutional:      Appearance: Normal appearance. He is ill-appearing, toxic-appearing and diaphoretic.  HENT:     Head: Normocephalic and atraumatic.  Eyes:     General: No scleral icterus.    Conjunctiva/sclera: Conjunctivae normal.  Cardiovascular:     Rate and Rhythm: Regular rhythm. Tachycardia present.  Pulmonary:     Effort: Pulmonary effort is normal. No respiratory distress.  Abdominal:     General: Abdomen is flat.     Palpations: Abdomen is soft.     Tenderness: There is abdominal tenderness in the suprapubic area.  Genitourinary:    Testes:        Right: Mass not present.        Left: Mass not present.     Rectum: Normal.     Comments: GU exam performed with nursing and nurse tech.  Suprapubic catheter in place, orange urine with clots in the catheter tube.  Patient with large amounts of yeastlike discharge around the penis.  Erythema and apparent weeping from the perineum.  No obvious sores or ulcers. Skin:    Findings: No rash.  Neurological:     Mental Status: He is alert.  Psychiatric:        Mood and Affect: Mood normal.    ED Results / Procedures / Treatments   Labs (all labs ordered are listed, but only abnormal results are displayed) Labs Reviewed  COMPREHENSIVE METABOLIC PANEL - Abnormal; Notable for the following components:       Result Value   CO2 18 (*)    Glucose, Bld 107 (*)    BUN 29 (*)    Albumin 3.2 (*)    All other components within normal limits  CBC WITH DIFFERENTIAL/PLATELET - Abnormal; Notable for the following components:   WBC 11.1 (*)    Neutro Abs 8.3 (*)    All other components within normal limits  LACTIC ACID, PLASMA - Abnormal; Notable for the following components:   Lactic Acid, Venous 4.5 (*)    All other components within normal limits  I-STAT CHEM 8, ED - Abnormal; Notable for the following components:   BUN 29 (*)    Glucose, Bld 101 (*)  All other components within normal limits  RESP PANEL BY RT-PCR (FLU A&B, COVID) ARPGX2  CULTURE, BLOOD (ROUTINE X 2)  CULTURE, BLOOD (ROUTINE X 2)  URINE CULTURE  LIPASE, BLOOD  URINALYSIS, ROUTINE W REFLEX MICROSCOPIC  LACTIC ACID, PLASMA  PROTIME-INR  APTT    EKG None  Radiology DG Chest Port 1 View  Result Date: 06/28/2021 CLINICAL DATA:  Questionable sepsis. EXAM: PORTABLE CHEST 1 VIEW COMPARISON:  CT chest dated November 19, 2020. Chest x-ray dated February 27, 2016. FINDINGS: Normal heart size. Unchanged large hiatal hernia. Chronic left basilar atelectasis/scarring. No focal consolidation, pleural effusion, or pneumothorax. No acute osseous abnormality. IMPRESSION: 1. No active disease. 2. Unchanged large hiatal hernia. Electronically Signed   By: Titus Dubin M.D.   On: 06/28/2021 14:19    Procedures Procedures  Patient in sinus tach on the monitor  Medications Ordered in ED Medications  sodium chloride 0.9 % bolus 500 mL (has no administration in time range)  HYDROmorphone (DILAUDID) injection 1 mg (has no administration in time range)  vancomycin (VANCOCIN) IVPB 1000 mg/200 mL premix (has no administration in time range)  lactated ringers infusion (has no administration in time range)  lactated ringers bolus 1,000 mL (has no administration in time range)    And  lactated ringers bolus 1,000 mL (has no administration in  time range)    And  lactated ringers bolus 500 mL (has no administration in time range)    ED Course/ Medical Decision Making/ A&P                           Medical Decision Making Amount and/or Complexity of Data Reviewed Labs: ordered. Radiology: ordered.  Risk Prescription drug management.   Patient presents to the ED for concern of abdominal pain. The differential diagnosis for generalized abdominal pain includes, but is not limited to AAA, gastroenteritis, appendicitis, Bowel obstruction, Bowel perforation. Gastroparesis, DKA, Hernia, Inflammatory bowel disease, mesenteric ischemia, pancreatitis, peritonitis SBP, volvulus.  All of these were considered throughout the evaluation of the patient.  Co morbidities that complicate the patient evaluation include: Hemiplegia, indwelling suprapubic catheter  Additional history obtained from internal and external records: Per chart review, patient has had multiple visits for catheter associated UTI.  In November, IR had to perform a procedure due to a dysfunctional suprapubic catheter.   Upon initial evaluation, patient was tachycardic and diaphoretic.  Sepsis work-up initiated.  Temporal temperature 98 however rectal temperature 100.3.  Patient had been receiving antipyretics at his SNF, unsure baseline temperature prior to administration.  Patient presented with paperwork, he is a full code.  Patient was bladder scanned twice with 0 cc of urine noted in the bladder.   Workup:  Lab Tests:  I Ordered, and personally interpreted labs.   The pertinent results include:  Lactic 4.5 White blood cell count only 11.1 Normal kidney function  2. Imaging Studies ordered:  I ordered imaging studies including chest x-ray, in line with sepsis order set.. I independently visualized and interpreted these and agree with the radiologist that there are no signs of acute process.  CT abdomen pelvis pending at this time.  3. Cardiac  Monitoring:  The patient was maintained on a cardiac monitor.  I personally viewed and interpreted the cardiac monitored which showed sinus tachycardia  4. Critical Interventions: Sepsis work-up initiated on arrival   Treatment:  Medications:  I ordered medication including Dilaudid for pain, vancomycin for presumed urinary infection. IVF  running.  Dispostion:  Patient's full work-up is pending at time of shift change.  Patient signed out to Alyse Low, please see her note for results and ultimate disposition.  I suspect patient will be admitted to the hospital.  Final Clinical Impression(s) / ED Diagnoses Final diagnoses:  None    Rx / DC Orders Patient's work-up pending at time of shift change.  Patient signed out to Marcene Brawn, please see her note for further work-up and ultimate disposition.  I suspect the patient will be admitted regardless of findings on CT scanning.     Rhae Hammock, PA-C 06/28/21 1547    Lorelle Gibbs, DO 06/30/21 0479

## 2021-06-28 NOTE — Assessment & Plan Note (Signed)
Continue Plavix if able to tolerate

## 2021-06-28 NOTE — Subjective & Objective (Signed)
brought by EMS from [facility] sharp abdominal pain. Reported increased abdominal distention. Increased  bladder spasms Pt with indwelling suprapubic catheter  severe pain to his penis.  Denies any nausea or vomiting.   Hx of chronic CVA with  left-sided hemiplegia in 2016.

## 2021-06-28 NOTE — H&P (Signed)
Gregory Kerr JXB:[id number] DOB: January 29, 1941 DOA: 06/28/2021     PCP: Virgel Bouquet, MD   Outpatient Specialists:   NEurology    Dr. Leonie Man   Patient arrived to ER on 06/28/21 at 1340 Referred by Attending Regan Lemming, MD   Patient coming from:    From facility [facility] place  Chief Complaint:  Chief Complaint  Patient presents with   Abdominal Pain    HPI: Gregory Kerr is a 81 y.o. male with medical history significant of HTN,   medical history of CVA, indwelling suprapubic catheter, congestive heart failure, hypertension and hyperlipidemia   Presented with   abdominal pain   brought by EMS from [facility] sharp abdominal pain. Reported increased abdominal distention. Increased  bladder spasms Pt with indwelling suprapubic catheter  severe pain to his penis.  Denies any nausea or vomiting.   Hx of chronic CVA with  left-sided hemiplegia in 2016.     Initial COVID TEST  NEGATIVE   Lab Results  Component Value Date   SARSCOV2NAA NEGATIVE 06/28/2021   [place] NEGATIVE 11/18/2020   [place] NEGATIVE 11/13/2019   [place] NEGATIVE 01/30/2019     Regarding pertinent Chronic problems:     Hyperlipidemia -  on statins lipitor Lipid Panel     Component Value Date/Time   CHOL 164 11/18/2020 1331   CHOL 192 07/14/2020 1321   TRIG 138 11/18/2020 1331   HDL 42 11/18/2020 1331   HDL 40 07/14/2020 1321   CHOLHDL 3.9 11/18/2020 1331   VLDL 28 11/18/2020 1331   LDLCALC 94 11/18/2020 1331   LDLCALC 119 (H) 07/14/2020 1321   LABVLDL 33 07/14/2020 1321     HTN on lisinopril   chronic CHF diastolic  - last echo July 2022       Hx of CVA -  with residual deficits on  Plavix    BPH - on Proscar flomax      While in ER: Noted to be tachycardic diaphoretic febrile up to 100.3  Appeared that suprapubic catheter was obstructed and not draining  It was exchanged in ER no urine in the bladder.  Found to have UTI Lactic acid 4.5 Meeting criteria  for sepsis started on aggressive IV fluid rehydration and given a dose of vancomycin  Ordered    CXR -  NON acute  CTabd/pelvis -large inguinal hernia with no incarceration or obstruction.  Suspect chronic bladder outlet obstruction cannot rule out neoplasm.  Obstipation with fecal impaction    Following Medications were ordered in ER: Medications  lactated ringers infusion (0 mLs Intravenous Stopped 06/28/21 2000)  sodium chloride 0.9 % bolus 500 mL (0 mLs Intravenous Stopped 06/28/21 2000)  HYDROmorphone (DILAUDID) injection 1 mg (1 mg Intravenous Given 06/28/21 1408)  vancomycin (VANCOCIN) IVPB 1000 mg/200 mL premix (0 mg Intravenous Stopped 06/28/21 2000)  lactated ringers bolus 1,000 mL (0 mLs Intravenous Stopped 06/28/21 1629)    And  lactated ringers bolus 1,000 mL (0 mLs Intravenous Stopped 06/28/21 1851)    And  lactated ringers bolus 500 mL (0 mLs Intravenous Stopped 06/28/21 1850)  acetaminophen (TYLENOL) tablet 650 mg (650 mg Oral Given 06/28/21 1635)  iohexol (OMNIPAQUE) 300 MG/ML solution 80 mL (80 mLs Intravenous Contrast Given 06/28/21 1610)  HYDROmorphone (DILAUDID) injection 1 mg (1 mg Intravenous Given 06/28/21 1628)  HYDROmorphone (DILAUDID) injection 1 mg (1 mg Intravenous Given 06/28/21 1854)     ED Triage Vitals  Enc Vitals Group     BP 06/28/21  1420 (!) 142/84     Pulse Rate 06/28/21 1420 93     Resp 06/28/21 1420 18     Temp 06/28/21 1420 100.3 F (37.9 C)     Temp Source 06/28/21 1420 Rectal     SpO2 06/28/21 1420 93 %     Weight 06/28/21 1423 165 lb (74.8 kg)     Height 06/28/21 1423 '5\' 6"'  (1.676 m)     Head Circumference --      Peak Flow --      Pain Score 06/28/21 1446 0     Pain Loc --      Pain Edu? --      Excl. in [place]? --   TMAX(24)@     _________________________________________ Significant initial  Findings: Abnormal Labs Reviewed  COMPREHENSIVE METABOLIC PANEL - Abnormal; Notable for the following components:      Result Value   CO2 18 (*)    Glucose,  Bld 107 (*)    BUN 29 (*)    Albumin 3.2 (*)    All other components within normal limits  CBC WITH DIFFERENTIAL/PLATELET - Abnormal; Notable for the following components:   WBC 11.1 (*)    Neutro Abs 8.3 (*)    All other components within normal limits  URINALYSIS, ROUTINE W REFLEX MICROSCOPIC - Abnormal; Notable for the following components:   Color, Urine AMBER (*)    APPearance CLOUDY (*)    Hgb urine dipstick MODERATE (*)    Protein, ur >=300 (*)    Nitrite POSITIVE (*)    Leukocytes,Ua MODERATE (*)    RBC / HPF >50 (*)    WBC, UA >50 (*)    Bacteria, UA MANY (*)    Non Squamous Epithelial 0-5 (*)    All other components within normal limits  LACTIC ACID, PLASMA - Abnormal; Notable for the following components:   Lactic Acid, Venous 4.5 (*)    All other components within normal limits  I-STAT CHEM 8, ED - Abnormal; Notable for the following components:   BUN 29 (*)    Glucose, Bld 101 (*)    All other components within normal limits    ECG: Ordered Personally reviewed by me showing: HR : 88 Rhythm:  NSR,   no evidence of ischemic changes QTC 564   ____________________ This patient meets SIRS Criteria and may be septic.     The recent clinical data is shown below. Vitals:   06/28/21 1945 06/28/21 2000 06/28/21 2030 06/28/21 2100  BP: (!) 121/54 (!) 141/99 (!) 113/53 (!) 151/78  Pulse: 82 69 73 90  Resp:  (!) 22 (!) 22 (!) 22  Temp:      TempSrc:      SpO2: 94% 90% 99% 98%  Weight:      Height:           WBC     Component Value Date/Time   WBC 11.1 (H) 06/28/2021 1403   LYMPHSABS 2.0 06/28/2021 1403   MONOABS 0.6 06/28/2021 1403   EOSABS 0.1 06/28/2021 1403   BASOSABS 0.0 06/28/2021 1403     Lactic Acid, Venous    Component Value Date/Time   LATICACIDVEN 4.5 (HH) 06/28/2021 1403     Procalcitonin   Ordered       UA  evidence of UTI     Urine analysis:    Component Value Date/Time   COLORURINE AMBER (A) 06/28/2021 2104    APPEARANCEUR CLOUDY (A) 06/28/2021 2104   LABSPEC 1.020 06/28/2021  2104   PHURINE 8.0 06/28/2021 2104   GLUCOSEU NEGATIVE 06/28/2021 2104   HGBUR MODERATE (A) 06/28/2021 2104   BILIRUBINUR NEGATIVE 06/28/2021 2104   KETONESUR NEGATIVE 06/28/2021 2104   PROTEINUR >=300 (A) 06/28/2021 2104   UROBILINOGEN 1.0 09/22/2014 0726   NITRITE POSITIVE (A) 06/28/2021 2104   LEUKOCYTESUR MODERATE (A) 06/28/2021 2104    Results for orders placed or performed during the hospital encounter of 05/24/21  Urine Culture     Status: Abnormal   Collection Time: 05/24/21 10:24 AM   Specimen: Urine, Suprapubic  Result Value Ref Range Status   Specimen Description URINE, SUPRAPUBIC  Final   Special Requests   Final    NONE Performed at [facility], [place] [address] [place], [place] [address]    Culture MULTIPLE SPECIES PRESENT, SUGGEST RECOLLECTION (A)  Final   Report Status 05/25/2021 FINAL  Final     _______________________________________________ Hospitalist was called for admission for sepsis due to UTI  The following Work up has been ordered so far:  Orders Placed This Encounter  Procedures   Resp Panel by RT-PCR (Flu A&B, Covid) Nasopharyngeal Swab   Blood culture (routine x 2)   Urine Culture   DG Chest Port 1 View   CT ABDOMEN PELVIS W CONTRAST   Comprehensive metabolic panel   Lipase, blood   CBC with Diff   Urinalysis, Routine w reflex microscopic   Lactic acid, plasma   Protime-INR   APTT   Lactic acid, plasma   Diet NPO time specified   Initiate Carrier Fluid Protocol   Check Rectal Temperature   Cardiac monitoring   Document height and weight   Assess and Document Glasgow Coma Scale   Document vital signs within 1-hour of fluid bolus completion. Notify provider of abnormal vital signs despite fluid resuscitation.   DO NOT delay antibiotics if unable to obtain blood culture.   Refer to Sidebar Report: Sepsis Sidebar ED/IP   Notify provider for difficulties obtaining  IV access.   Bladder scan   Nursing Communication Ua please and check on covid   pharmacy consult   Consult for [facility] Admission   Pulse oximetry, continuous   I-stat chem 8, ED (not at [facility] or [facility])   ED EKG   EKG 12-Lead   Insert peripheral IV     OTHER Significant initial  Findings:  labs showing:    Recent Labs  Lab 06/28/21 1403 06/28/21 1531  NA 140 142  K 4.6 4.4  CO2 18*  --   GLUCOSE 107* 101*  BUN 29* 29*  CREATININE 1.08 0.90  CALCIUM 9.3  --     Cr    stable,    Lab Results  Component Value Date   CREATININE 0.90 06/28/2021   CREATININE 1.08 06/28/2021   CREATININE 1.19 05/24/2021    Recent Labs  Lab 06/28/21 1403  AST 33  ALT 33  ALKPHOS 78  BILITOT 0.5  PROT 6.9  ALBUMIN 3.2*   Lab Results  Component Value Date   CALCIUM 9.3 06/28/2021          Plt: Lab Results  Component Value Date   PLT 271 06/28/2021     ABG    Component Value Date/Time   PHART 7.363 09/26/2014 1815   PCO2ART 32.0 (L) 09/26/2014 1815   PO2ART 68.4 (L) 09/26/2014 1815   HCO3 17.8 (L) 09/26/2014 1815   TCO2 22 06/28/2021 1531   ACIDBASEDEF 6.6 (H) 09/26/2014 1815   O2SAT  95.1 09/26/2014 1815         Recent Labs  Lab 06/28/21 1403 06/28/21 1531  WBC 11.1*  --   NEUTROABS 8.3*  --   HGB 14.0 14.3  HCT 43.0 42.0  MCV 89.6  --   PLT 271  --     HG/HCT * stable,  Down *Up from baseline see below    Component Value Date/Time   HGB 14.3 06/28/2021 1531   HGB 14.9 12/02/2019 1052   HCT 42.0 06/28/2021 1531   HCT 45.0 12/02/2019 1052   MCV 89.6 06/28/2021 1403   MCV 90 12/02/2019 1052      Recent Labs  Lab 06/28/21 1403  LIPASE 32   No results for input(s): AMMONIA in the last 168 hours.    Cardiac Panel (last 3 results) No results for input(s): CKTOTAL, CKMB, TROPONINI, RELINDX in the last 72 hours.  .car BNP (last 3 results) No results for input(s): BNP in the last 8760 hours.    DM  labs:  HbA1C: Recent Labs     11/18/20 1331  HGBA1C 5.6       CBG (last 3)  No results for input(s): GLUCAP in the last 72 hours.        Cultures:    Component Value Date/Time   SDES URINE, SUPRAPUBIC 05/24/2021 1024   SPECREQUEST  05/24/2021 1024    NONE Performed at [facility] [address], [place], [place] [address]    CULT MULTIPLE SPECIES PRESENT, SUGGEST RECOLLECTION (A) 05/24/2021 1024   REPTSTATUS 05/25/2021 FINAL 05/24/2021 1024     Radiological Exams on Admission: CT ABDOMEN PELVIS W CONTRAST  Result Date: 06/28/2021 CLINICAL DATA:  Acute abdominal pain EXAM: CT ABDOMEN AND PELVIS WITH CONTRAST TECHNIQUE: Multidetector CT imaging of the abdomen and pelvis was performed using the standard protocol following bolus administration of intravenous contrast. RADIATION DOSE REDUCTION: This exam was performed according to the departmental dose-optimization program which includes automated exposure control, adjustment of the mA and/or kV according to patient size and/or use of iterative reconstruction technique. CONTRAST:  50m OMNIPAQUE IOHEXOL 300 MG/ML  SOLN COMPARISON:  09/22/2014 FINDINGS: Lower chest: No acute pleural or parenchymal lung disease. Large hiatal hernia unchanged. Hepatobiliary: No focal liver abnormality is seen. Status post cholecystectomy. No biliary dilatation. Evaluation limited by respiratory motion. Pancreas: Unremarkable. No pancreatic ductal dilatation or surrounding inflammatory changes. Spleen: Normal in size without focal abnormality. Adrenals/Urinary Tract: Evaluation of the right kidney is limited due to respiratory motion. Bilateral renal cortical thinning. Small left renal cortical cyst. No urinary tract calculi or obstructive uropathy. There is irregular bladder wall thickening measuring up to 1.6 cm posteriorly, with indwelling suprapubic catheter identified. There are either calcified concretions, mural calcifications, or bladder calculi identified adjacent to the tip of  the Foley catheter, measuring up to 1.2 cm in size. The adrenals are unremarkable. Stomach/Bowel: No bowel obstruction or ileus. Moderate retained stool throughout the colon, with prominent stool in the rectal vault insistent with fecal impaction. The appendix is surgically absent. No bowel wall thickening or inflammatory change. Vascular/Lymphatic: Aortic atherosclerosis. No enlarged abdominal or pelvic lymph nodes. Reproductive: Stable enlargement the prostate measuring proximally 5.3 x 3.8 cm. Other: No free fluid or free gas. There are large bilateral inguinal hernias, with the right inguinal hernia containing multiple loops of distal ileum. No evidence of incarceration or bowel obstruction. Musculoskeletal: No acute or destructive bony lesions. Reconstructed images demonstrate no additional findings. IMPRESSION: 1. Large right inguinal hernia  containing a segment of distal ileum. No incarceration or bowel obstruction. 2. Fat containing left inguinal hernia. 3. Large hiatal hernia. 4. Irregular bladder wall thickening measuring up to 1.6 cm. This may be due to chronic bladder outlet obstruction given enlarged prostate and indwelling suprapubic catheter. If neoplasm is a concern, cystoscopy could be performed. 5. Calcified concretions within the bladder loop, surrounding the tip of the Foley catheter. 6. Moderate retained stool throughout the colon, with probable fecal impaction within the rectal vault. 7.  Aortic Atherosclerosis (ICD10-I70.0). Electronically Signed   By: Randa Ngo M.D.   On: 06/28/2021 16:19   DG Chest Port 1 View  Result Date: 06/28/2021 CLINICAL DATA:  Questionable sepsis. EXAM: PORTABLE CHEST 1 VIEW COMPARISON:  CT chest dated November 19, 2020. Chest x-ray dated February 27, 2016. FINDINGS: Normal heart size. Unchanged large hiatal hernia. Chronic left basilar atelectasis/scarring. No focal consolidation, pleural effusion, or pneumothorax. No acute osseous abnormality. IMPRESSION: 1. No  active disease. 2. Unchanged large hiatal hernia. Electronically Signed   By: Titus Dubin M.D.   On: 06/28/2021 14:19   _______________________________________________________________________________________________________ Latest  Blood pressure (!) 151/78, pulse 90, temperature 100.3 F (37.9 C), temperature source Rectal, resp. rate (!) 22, height '5\' 6"'  (1.676 m), weight 74.8 kg, SpO2 98 %.   Vitals  labs and radiology finding personally reviewed  Review of Systems:    Pertinent positives include:  Fevers, chills, fatigue,  abdominal pain, Constitutional:  No weight loss, night sweats,  weight loss  HEENT:  No headaches, Difficulty swallowing,Tooth/dental problems,Sore throat,  No sneezing, itching, ear ache, nasal congestion, post nasal drip,  Cardio-vascular:  No chest pain, Orthopnea, PND, anasarca, dizziness, palpitations.no Bilateral lower extremity swelling  GI:  No heartburn, indigestion, nausea, vomiting, diarrhea, change in bowel habits, loss of appetite, melena, blood in stool, hematemesis Resp:  no shortness of breath at rest. No dyspnea on exertion, No excess mucus, no productive cough, No non-productive cough, No coughing up of blood.No change in color of mucus.No wheezing. Skin:  no rash or lesions. No jaundice GU:  no dysuria, change in color of urine, no urgency or frequency. No straining to urinate.  No flank pain.  Musculoskeletal:  No joint pain or no joint swelling. No decreased range of motion. No back pain.  Psych:  No change in mood or affect. No depression or anxiety. No memory loss.  Neuro: no localizing neurological complaints, no tingling, no weakness, no double vision, no gait abnormality, no slurred speech, no confusion  All systems reviewed and apart from [place] all are negative _______________________________________________________________________________________________ Past Medical History:   Past Medical History:  Diagnosis Date   GERD  (gastroesophageal reflux disease)    Hernia    "large one; on my left stomach" (09/22/2014)   Hiatal hernia 10/24/2012   HTN (hypertension) 10/24/2012   Hyperlipidemia    Hypertension    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   Osteoporosis, unspecified 10/24/2012   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   Pneumonia    "once or twice" (09/22/2014)   Retinal detachment    left eye   Stroke [facility]) 2007   "left side doesn't work now" (09/22/2014)      Past Surgical History:  Procedure Laterality Date   APPENDECTOMY  05/27/2009   Ronnald Collum, MD   CATARACT EXTRACTION W/ INTRAOCULAR LENS  IMPLANT, BILATERAL Bilateral ?2014   CHOLECYSTECTOMY N/A 09/26/2014   Procedure: LAPAROSCOPIC CHOLECYSTECTOMY WITH INTRAOPERATIVE CHOLANGIOGRAM;  Surgeon: Donnie Mesa, MD;  Location: [place];  Service: General;  Laterality: N/A;   COLONOSCOPY WITH PROPOFOL N/A 12/29/2015   Procedure: COLONOSCOPY WITH PROPOFOL;  Surgeon: Wilford Corner, MD;  Location: [facility] ENDOSCOPY;  Service: Endoscopy;  Laterality: N/A;   ERCP N/A 09/25/2014   Procedure: ENDOSCOPIC RETROGRADE CHOLANGIOPANCREATOGRAPHY (ERCP);  Surgeon: Clarene Essex, MD;  Location: [facility] ENDOSCOPY;  Service: Endoscopy;  Laterality: N/A;   IR CATHETER TUBE CHANGE  05/24/2021   PARS PLANA VITRECTOMY Left 01/30/2019   Procedure: PARS PLANA VITRECTOMY WITH 25 GAUGE;  Surgeon: Sherlynn Stalls, MD;  Location: [place];  Service: Ophthalmology;  Laterality: Left;   PARS PLANA VITRECTOMY Right 11/13/2019   Procedure: PARS PLANA VITRECTOMY WITH 25 GAUGE WITH REMOVAL OF POSTERIOR CAPSULAR OPACIFICATION WITH VITRECTOR;  Surgeon: Sherlynn Stalls, MD;  Location: [place];  Service: Ophthalmology;  Laterality: Right;   PHOTOCOAGULATION WITH LASER Left 01/30/2019   Procedure: PHOTOCOAGULATION WITH LASER;  Surgeon: Sherlynn Stalls, MD;  Location: [place];  Service: Ophthalmology;  Laterality: Left;    Social History:  Ambulatory  bed bound     reports that he quit smoking about 15 years ago. His smoking use  included cigarettes. He smoked an average of 1 pack per day. He has never used smokeless tobacco. He reports that he does not currently use alcohol. He reports that he does not use drugs.     Family History:   Family History  Problem Relation Age of Onset   Heart attack Mother    Heart attack Father    Cancer Maternal Aunt        Stomach   Cancer Paternal Aunt        Uterine,Colon   ______________________________________________________________________________________________ Allergies: Allergies  Allergen Reactions   Penicillins Rash    ++++Patient in nursing home. Unable to clarify details/severity of the reaction.++++     Prior to Admission medications   Medication Sig Start Date End Date Taking? Authorizing Provider  atorvastatin (LIPITOR) 40 MG tablet Take 1 tablet (40 mg total) by mouth daily. Patient taking differently: Take 40 mg by mouth every evening. 11/22/20  Yes Madalyn Rob, MD  baclofen (LIORESAL) 20 MG tablet Take 20 mg by mouth 3 (three) times daily.   Yes [provider]  bisacodyl (DULCOLAX) 10 MG suppository Place 10 mg rectally daily as needed for moderate constipation.   Yes [provider]  calcium-vitamin D (OSCAL WITH D) 500-200 MG-UNIT tablet Take 1 tablet by mouth 2 (two) times daily.   Yes [provider]  clopidogrel (PLAVIX) 75 MG tablet Take 1 tablet (75 mg total) by mouth daily. 11/22/20  Yes Madalyn Rob, MD  Dextromethorphan-quiNIDine (NUEDEXTA) 20-10 MG capsule Take 1 capsule by mouth daily. For Pseudobulbar Affect (PBA)   Yes [provider]  diclofenac Sodium (VOLTAREN) 1 % GEL Apply 2 g topically in the morning, at noon, and at bedtime. To right shoulder   Yes [provider]  gabapentin (NEURONTIN) 100 MG capsule Take 100 mg by mouth 3 (three) times daily.   Yes [provider]  guaiFENesin (MUCINEX) 600 MG 12 hr tablet Take 600 mg by mouth 2 (two) times daily.   Yes [provider]   hydroxypropyl methylcellulose / hypromellose (ISOPTO TEARS / GONIOVISC) 2.5 % ophthalmic solution Place 1 drop into both eyes 3 (three) times daily.   Yes [provider]  lisinopril (ZESTRIL) 10 MG tablet Take 10 mg by mouth daily.   Yes [provider]  loperamide (IMODIUM A-D) 2 MG tablet Take 4 mg by mouth 4 (four) times daily  as needed for diarrhea or loose stools.   Yes [provider]  loratadine (CLARITIN) 10 MG tablet Take 10 mg by mouth daily.   Yes [provider]  LORazepam (ATIVAN) 0.5 MG tablet Take 0.5 mg by mouth 2 (two) times daily as needed for anxiety.   Yes [provider]  Miconazole-Zinc Oxide-Petrolat 0.25-15-81.35 % OINT Apply 1 application topically every 4 (four) hours. To penis and groin area   Yes [provider]  mirabegron ER (MYRBETRIQ) 25 MG TB24 tablet Take 25 mg by mouth every evening.   Yes [provider]  mupirocin ointment (BACTROBAN) 2 % Apply 1 application topically 2 (two) times daily. Suprapubic site   Yes [provider]  nitroGLYCERIN (NITROSTAT) 0.4 MG SL tablet Place 0.4 mg under the tongue every 5 (five) minutes as needed for chest pain.   Yes [provider]  Nutritional Supplements (BOOST PO) Take 1 Container by mouth daily. Lactose-Reduced 0..04 g/1 kcal/ml liquid   Yes [provider]  nystatin (MYCOSTATIN/NYSTOP) powder Apply 1 application topically 3 (three) times daily. To bilateral groin, penis, and abdominal folds 06/21/21 07/04/21 Yes [provider]  pantoprazole (PROTONIX) 20 MG tablet Take 20 mg by mouth daily. 11/17/20  Yes [provider]  phenazopyridine (PYRIDIUM) 100 MG tablet Take 100 mg by mouth every 8 (eight) hours as needed (bladder spasms).   Yes [provider]  polyethylene glycol (MIRALAX / GLYCOLAX) packet Take 17 g by mouth daily as needed (constipation).    Yes [provider]  Prenatal Vit-Fe  Fumarate-FA (PREPLUS) 27-1 MG TABS Take 1 tablet by mouth daily.    Yes [provider]  PROLIA 60 MG/ML SOSY injection Inject 60 mg into the skin every 6 (six) months. 09/07/20  Yes [provider]  sertraline (ZOLOFT) 100 MG tablet Take 100 mg by mouth daily.   Yes [provider]  solifenacin (VESICARE) 5 MG tablet Take 1 tablet (5 mg total) by mouth daily. 11/21/20  Yes Madalyn Rob, MD  sulfamethoxazole-trimethoprim (BACTRIM DS) 800-160 MG tablet Take 1 tablet by mouth daily. Continuous   Yes [provider]  traMADol (ULTRAM) 50 MG tablet Take 50 mg by mouth See admin instructions. Take 50 mg twice daily scheduled, may take a 3rd 50 mg dose as needed for pain   Yes [provider]  Vitamins A & D (VITAMIN A & D) ointment Apply 1 application topically in the morning, at noon, in the evening, and at bedtime. To abdominal folds, penis and scrotum along with nystatin   Yes [provider]  tamsulosin (FLOMAX) 0.4 MG CAPS capsule Take 1 capsule (0.4 mg total) by mouth at bedtime. Patient not taking: Reported on 06/28/2021 11/21/20   Madalyn Rob, MD    ___________________________________________________________________________________________________ Physical Exam: Vitals with BMI 06/28/2021 06/28/2021 06/28/2021  Height - - -  Weight - - -  BMI - - -  Systolic 224 497 530  Diastolic 78 53 99  Pulse 90 73 69     1. General:  in No  Acute distress   Chronically ill   -appearing 2. Psychological: Alert and   Oriented 3. Head/ENT:    Dry Mucous Membranes                          Head Non traumatic, neck supple  Poor Dentition 4. SKIN:  decreased Skin turgor,  Skin clean Dry and intact no rash 5. Heart: Regular rate and rhythm no  Murmur, no Rub or gallop 6. Lungs:   no wheezes or crackles   7. Abdomen: Soft,  non-tender, Non distended bowel sounds present, 8. Lower extremities: no clubbing, cyanosis, no  edema 9.  Neurologically left side weakness chronic with spasticity Generalized increased tonus noted 10. MSK: Normal range of motion    Chart has been reviewed  ______________________________________________________________________________________________  Assessment/Plan  81 y.o. male with medical history significant of HTN,   medical history of CVA, indwelling suprapubic catheter, congestive heart failure, hypertension and hyperlipidemia    Admitted for sepsis due to UTI  Present on Admission:  HTN (hypertension)  GERD (gastroesophageal reflux disease)  Sepsis ([place])  Spasticity  Prolonged QT interval     HTN (hypertension) Allow permissive hypertension  Sepsis ([place])  -SIRS criteria met with elevated white blood cell count,       Component Value Date/Time   WBC 11.1 (H) 06/28/2021 1403   LYMPHSABS 2.0 06/28/2021 1403   fever   RR >20 Today's Vitals   06/28/21 2030 06/28/21 2100 06/28/21 2115 06/28/21 2215  BP: (!) 113/53 (!) 151/78 119/64 (!) 108/55  Pulse: 73 90 77 66  Resp: (!) 22 (!) 22  (!) 8  Temp:      TempSrc:      SpO2: 99% 98% 96% 93%  Weight:      Height:      PainSc:          Vitals:   06/28/21 2030 06/28/21 2100 06/28/21 2115 06/28/21 2215  BP: (!) 113/53 (!) 151/78 119/64 (!) 108/55  Pulse: 73 90 77 66  Resp: (!) 22 (!) 22  (!) 8  Temp:      TempSrc:      SpO2: 99% 98% 96% 93%  Weight:      Height:        -Most likely source being: urinary,      Patient meeting criteria for Severe sepsis with    evidence of end organ damage/organ dysfunction such as     elevated lactic acid >2     Component Value Date/Time   LATICACIDVEN 4.5 (HH) 06/28/2021 1403       - Obtain serial lactic acid and procalcitonin level.  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  DVT prophylaxis:  SCD      Code Status:    Code Status: Prior FULL CODE    as per patient   I had personally discussed CODE STATUS with patient     Family Communication:   Family not at  Bedside  Disposition Plan:     Following barriers for discharge:                            Electrolytes corrected                               Anemia corrected                             Pain controlled with PO medications                               Afebrile, white count improving able to transition to PO antibiotics                             Will need to be able to tolerate PO                            Will likely need home health, home O2, set up                           Will need consultants to evaluate patient prior to discharge                       Would benefit from PT/OT eval prior to DC  Ordered                                     Transition of care consulted                   Nutrition    consulted                                      Consults called: none  Admission status:  ED Disposition     ED Disposition  Admit   Condition  --   [place]: [place] [[id number]]  Level of Care: Progressive [102]  Admit to Progressive based on following criteria: CARDIOVASCULAR & THORACIC of moderate stability with acute coronary syndrome symptoms/low risk myocardial infarction/hypertensive urgency/arrhythmias/heart failure potentially compromising  stability and stable post cardiovascular intervention patients.  May admit patient to [facility] or [facility] if equivalent level of care is available:: No  Covid Evaluation: Asymptomatic Screening Protocol (No Symptoms)  Diagnosis: Sepsis [facility]) [[id number]]  Admitting Physician: Toy Baker [[id number]]  Attending Physician: Toy Baker [[id number]]  Estimated length of stay: past midnight tomorrow  Certification:: I certify this patient will need inpatient services for at least 2 midnights               inpatient     I Expect 2 midnight stay secondary to severity of patient's current illness need for inpatient interventions justified by the following:     Severe lab/radiological/exam abnormalities including:    sepsis and extensive comorbidities including:  history of stroke with residual deficits    That are currently affecting medical management.   I expect  patient to  be hospitalized for 2 midnights requiring inpatient medical care.  Patient is at high risk for adverse outcome (such as loss of life or disability) if not treated.  Indication for inpatient stay as follows:    Hemodynamic instability despite maximal medical therapy,    severe pain requiring acute inpatient management,      Need for IV antibiotics, IV fluids      Level of care  rogressive tele indefinitely please discontinue once patient no longer qualifies COVID-19 Labs    Lab Results  Component Value Date   [place] 06/28/2021     Precautions: admitted as   Covid Negative    Keyira Mondesir 06/28/2021, 11:44 PM    [facility]     after 2 AM please page floor coverage PA If 7AM-7PM, please contact the day team taking care of the patient using [email or web address]   Patient was evaluated in the context of the global COVID-19 pandemic, which necessitated consideration that the patient might be at risk for infection with the SARS-CoV-2 virus that causes COVID-19.  Institutional protocols and algorithms that pertain to the evaluation of patients at risk for COVID-19 are in a state of rapid change based on information released by regulatory bodies including the CDC and federal and state organizations. These policies and algorithms were followed during the patient's care.

## 2021-06-28 NOTE — ED Notes (Signed)
0 ml for bladder scan

## 2021-06-28 NOTE — Progress Notes (Signed)
Elink following code sepsis °

## 2021-06-28 NOTE — ED Triage Notes (Signed)
Pt here via EMS from [facility] sharp abdominal pain. Reported increased abdominal distention. Reports of increased bladder spasms. Facility noticed urine dark in color. Pt appears to be in distress dt pain during triage.  10/10  154/84 HR 84 RR 22 95% RA

## 2021-06-28 NOTE — Assessment & Plan Note (Signed)
Allow permissive hypertension 

## 2021-06-28 NOTE — Assessment & Plan Note (Signed)
-  SIRS criteria met with elevated white blood cell count,       Component Value Date/Time   WBC 11.1 (H) 06/28/2021 1403   LYMPHSABS 2.0 06/28/2021 1403   fever   RR >20 Today's Vitals   06/28/21 2030 06/28/21 2100 06/28/21 2115 06/28/21 2215  BP: (!) 113/53 (!) 151/78 119/64 (!) 108/55  Pulse: 73 90 77 66  Resp: (!) 22 (!) 22  (!) 8  Temp:      TempSrc:      SpO2: 99% 98% 96% 93%  Weight:      Height:      PainSc:          Vitals:   06/28/21 2030 06/28/21 2100 06/28/21 2115 06/28/21 2215  BP: (!) 113/53 (!) 151/78 119/64 (!) 108/55  Pulse: 73 90 77 66  Resp: (!) 22 (!) 22  (!) 8  Temp:      TempSrc:      SpO2: 99% 98% 96% 93%  Weight:      Height:        -Most likely source being: urinary,      Patient meeting criteria for Severe sepsis with    evidence of end organ damage/organ dysfunction such as     elevated lactic acid >2     Component Value Date/Time   LATICACIDVEN 4.5 (HH) 06/28/2021 1403       - Obtain serial lactic acid and procalcitonin level.  - Initiated IV antibiotics in ER: Antibiotics Given (last 72 hours)    Date/Time Action Medication Dose Rate   06/28/21 1858 New Bag/Given   vancomycin (VANCOCIN) IVPB 1000 mg/200 mL premix 1,000 mg 200 mL/hr   06/28/21 2252 New Bag/Given   ceFEPIme (MAXIPIME) 2 g in sodium chloride 0.9 % 100 mL IVPB 2 g 200 mL/hr      Will continue  on :cefepime   - await results of blood and urine culture  - Rehydrate aggressively  Intravenous fluids were administered    30cc/kg fluid   11:09 PM

## 2021-06-29 ENCOUNTER — Other Ambulatory Visit: Payer: Self-pay

## 2021-06-29 ENCOUNTER — Encounter (HOSPITAL_COMMUNITY): Payer: Self-pay | Admitting: Internal Medicine

## 2021-06-29 DIAGNOSIS — N3289 Other specified disorders of bladder: Secondary | ICD-10-CM | POA: Diagnosis not present

## 2021-06-29 DIAGNOSIS — N179 Acute kidney failure, unspecified: Secondary | ICD-10-CM

## 2021-06-29 DIAGNOSIS — N1831 Chronic kidney disease, stage 3a: Secondary | ICD-10-CM

## 2021-06-29 DIAGNOSIS — R531 Weakness: Secondary | ICD-10-CM

## 2021-06-29 DIAGNOSIS — A419 Sepsis, unspecified organism: Secondary | ICD-10-CM | POA: Diagnosis not present

## 2021-06-29 DIAGNOSIS — K5900 Constipation, unspecified: Secondary | ICD-10-CM | POA: Diagnosis not present

## 2021-06-29 LAB — COMPREHENSIVE METABOLIC PANEL
ALT: 30 U/L (ref 0–44)
AST: 30 U/L (ref 15–41)
Albumin: 2.7 g/dL — ABNORMAL LOW (ref 3.5–5.0)
Alkaline Phosphatase: 62 U/L (ref 38–126)
Anion gap: 7 (ref 5–15)
BUN: 22 mg/dL (ref 8–23)
CO2: 21 mmol/L — ABNORMAL LOW (ref 22–32)
Calcium: 8.2 mg/dL — ABNORMAL LOW (ref 8.9–10.3)
Chloride: 111 mmol/L (ref 98–111)
Creatinine, Ser: 0.82 mg/dL (ref 0.61–1.24)
GFR, Estimated: >60 mL/min (ref >60)
Glucose, Bld: 82 mg/dL (ref 70–99)
Potassium: 3.7 mmol/L (ref 3.5–5.1)
Sodium: 139 mmol/L (ref 135–145)
Total Bilirubin: 0.7 mg/dL (ref 0.3–1.2)
Total Protein: 5.6 g/dL — ABNORMAL LOW (ref 6.5–8.1)

## 2021-06-29 LAB — CBC WITH DIFFERENTIAL/PLATELET
Abs Immature Granulocytes: 0.04 10*3/uL (ref 0.00–0.07)
Basophils Absolute: 0.1 10*3/uL (ref 0.0–0.1)
Basophils Relative: 1 %
Eosinophils Absolute: 0.2 10*3/uL (ref 0.0–0.5)
Eosinophils Relative: 3 %
HCT: 37.2 % — ABNORMAL LOW (ref 39.0–52.0)
Hemoglobin: 12.1 g/dL — ABNORMAL LOW (ref 13.0–17.0)
Immature Granulocytes: 1 %
Lymphocytes Relative: 21 %
Lymphs Abs: 1.5 10*3/uL (ref 0.7–4.0)
MCH: 29.5 pg (ref 26.0–34.0)
MCHC: 32.5 g/dL (ref 30.0–36.0)
MCV: 90.7 fL (ref 80.0–100.0)
Monocytes Absolute: 0.5 10*3/uL (ref 0.1–1.0)
Monocytes Relative: 7 %
Neutro Abs: 5 10*3/uL (ref 1.7–7.7)
Neutrophils Relative %: 67 %
Platelets: 206 10*3/uL (ref 150–400)
RBC: 4.1 MIL/uL — ABNORMAL LOW (ref 4.22–5.81)
RDW: 15.7 % — ABNORMAL HIGH (ref 11.5–15.5)
WBC: 7.4 10*3/uL (ref 4.0–10.5)
nRBC: 0 % (ref 0.0–0.2)

## 2021-06-29 LAB — TSH: TSH: 3.371 u[IU]/mL (ref 0.350–4.500)

## 2021-06-29 LAB — LACTIC ACID, PLASMA: Lactic Acid, Venous: 0.7 mmol/L (ref 0.5–1.9)

## 2021-06-29 LAB — PHOSPHORUS: Phosphorus: 2.5 mg/dL (ref 2.5–4.6)

## 2021-06-29 LAB — MAGNESIUM: Magnesium: 1.7 mg/dL (ref 1.7–2.4)

## 2021-06-29 MED ORDER — SODIUM CHLORIDE 0.9 % IV SOLN
75.0000 mL/h | INTRAVENOUS | Status: AC
  Administered 2021-06-29: 75 mL/h via INTRAVENOUS

## 2021-06-29 NOTE — Progress Notes (Addendum)
Patient arrived to room 5W12 from ED.  Assessment complete, VS obtained, and Admission database began.

## 2021-06-29 NOTE — Assessment & Plan Note (Signed)
Continue PPI ?

## 2021-06-29 NOTE — Evaluation (Signed)
Occupational Therapy Evaluation Patient Details Name: Gregory Kerr MRN: [id number] DOB: 09/12/1940 Today's Date: 06/29/2021   History of Present Illness 81 yr old man who presents with complaints of abdominal pain. CT abdomen and pelvis has demonstrated obstipation with fecal impaction.  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 Workup ongoing for UTI. PMH: HTN, CVA with L spastic hemiplegia, indwelling suprapubic catheter, CHF.   Clinical Impression   Pt is [facility] term [facility] of [facility] since his stroke 11 yrs ago. Pt states since his last recent stroke he has been essentially bed bound and transfers occasionally to his wc with use of a hoyer. Staff assists with all ADL tasks however pt is usually able to feed himself. 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 Pt states that the OT at the snf has ordered an elbow splint. Will follow up acutely for contracture management to reduce pain and improve skin integrity. Will also issue AE to help pt with self feeding given the increased weakness of his R hand since being "sick". Plan is for pt to DC back to his SNF long term care.     Recommendations for follow up therapy are one component of a multi-disciplinary discharge planning process, led by the attending physician.  Recommendations may be updated based on patient status, additional functional criteria and insurance authorization.   Follow Up Recommendations  OT at Long-term acute care hospital (for positioning and contracture management)    Assistance Recommended at Discharge Frequent or constant Supervision/Assistance  Patient can return home with the following      Functional Status Assessment  Patient has not had a recent decline in their functional status  Equipment Recommendations       Recommendations for Other Services        Precautions / Restrictions Precautions Precautions: Other (comment) (suprapubic catheter)      Mobility Bed Mobility Overal bed mobility: Needs Assistance             General bed mobility comments: total A at baseline    Transfers Overall transfer level:  (uses [employer])                        Balance                                           ADL either performed or assessed with clinical judgement   ADL Overall ADL's : At baseline                                             Vision         Perception     Praxis      Pertinent Vitals/Pain Pain Assessment Pain Assessment: Faces Faces Pain Scale: Hurts little more Pain Descriptors / Indicators:  (abdomen) Pain Intervention(s): Limited activity within patient's tolerance     Hand Dominance     Extremity/Trunk Assessment Upper Extremity Assessment Upper Extremity Assessment: RUE deficits/detail;LUE deficits/detail RUE Deficits / Details: limitations in shoulder movement , making ADL tasks difficult; weak grasp but using functionally to help feed self RUE Coordination: decreased  fine motor;decreased gross motor LUE Deficits / Details: spastic hemiplegia with flexion contractures; MASD elbow, axilladn hand LUE Coordination: decreased gross motor;decreased fine motor (non-functional)   Lower Extremity Assessment Lower Extremity Assessment: Defer to PT evaluation (LLE in extension with ankle inverted adn plantarfexed)   Cervical / Trunk Assessment Cervical / Trunk Assessment: Other exceptions (trunk shortening L)   Communication     Cognition Arousal/Alertness: Awake/alert Behavior During Therapy: WFL for tasks assessed/performed Overall Cognitive Status: History of cognitive impairments - at baseline                                       General Comments  odor adn excoriated area L anticubital fossa    Exercises     Shoulder  Instructions      Home Living Family/patient expects to be discharged to:: Skilled nursing facility                                        Prior Functioning/Environment Prior Level of Function : Needs assist             Mobility Comments: total lift with hoyer ADLs Comments: a with all ADL with exception of feeding; Has not been out of bed in @ 6 wks per pt due to abdominal pain        OT Problem List: Decreased strength;Decreased range of motion;Impaired sensation;Impaired tone;Impaired UE functional use;Obesity;Pain      OT Treatment/Interventions: DME and/or AE instruction;Splinting;Therapeutic activities;Patient/family education;Other (comment) (contracture management)    OT Goals(Current goals can be found in the care plan section) Acute Rehab OT Goals Patient Stated Goal: to not have pain OT Goal Formulation: With patient Time For Goal Achievement: 07/13/21 Potential to Achieve Goals: Good  OT Frequency: Min 2X/week    Co-evaluation              AM-PAC OT "6 Clicks" Daily Activity     Outcome Measure Help from another person eating meals?: A Little Help from another person taking care of personal grooming?: A Lot Help from another person toileting, which includes using toliet, bedpan, or urinal?: Total Help from another person bathing (including washing, rinsing, drying)?: A Lot Help from another person to put on and taking off regular upper body clothing?: Total Help from another person to put on and taking off regular lower body clothing?: Total 6 Click Score: 10   End of Session Nurse Communication: Mobility status;Other (comment) (skin care)  Activity Tolerance: Patient tolerated treatment well Patient left: in bed;with call bell/phone within reach (stretcher)  OT Visit Diagnosis: Pain;Muscle weakness (generalized) (M62.81);Other (comment) (contractures) Pain - part of body:  (abdomen)                Time: 1314-1340 OT Time  Calculation (min): 26 min Charges:  OT General Charges $OT Visit: 1 Visit OT Evaluation $OT Eval Moderate Complexity: [place], OT/L   Acute OT Clinical Specialist [place] Pager [phone] Office [phone]   [facility] 06/29/2021, 3:33 PM

## 2021-06-29 NOTE — Progress Notes (Signed)
Progress Note   Patient: Gregory Kerr UOH:[id number] DOB: 09-11-1940 DOA: 06/28/2021     1 DOS: the patient was seen and examined on 06/29/2021   Brief hospital course: The patient is a 81 yr old man who presented to [facility] ED on 06/28/2021 with complaints of abdominal pain. 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 In the ED he was found to have a fever of 100.3, tachycardia, lactic acid of 4.5, tachypnea, and metabolic acidosis. This was felt to be due to UTI. Urinalysis is suggestive of UTI. Urine culture has been collected, but is pending. Blood cultures x 2 have been collected and have had no growth.   CT abdomen and pelvis has demonstrated obstipation with fecal impaction.   [facility] were consulted to admit the patient for further evaluation and care.  The patient is receiving IV fluids and IV cefepime and vancomycin. Will screen for MRSA.   The patient has had one enema without strong results the enema will be repeated.  Severe Sepsis appears to be improving.  Assessment and Plan: HTN (hypertension) Allow permissive hypertension  Sepsis ([place])  -SIRS criteria met with elevated white blood cell count,       Component Value Date/Time   WBC 11.1 (H) 06/28/2021 1403   LYMPHSABS 2.0 06/28/2021 1403   fever   RR >20 Today's Vitals   06/28/21 2030 06/28/21 2100 06/28/21 2115 06/28/21 2215  BP: (!) 113/53 (!) 151/78 119/64 (!) 108/55  Pulse: 73 90 77 66  Resp: (!) 22 (!) 22  (!) 8  Temp:      TempSrc:      SpO2: 99% 98% 96% 93%  Weight:      Height:      PainSc:          Vitals:   06/28/21 2030 06/28/21 2100 06/28/21 2115 06/28/21 2215  BP: (!) 113/53 (!) 151/78 119/64 (!) 108/55  Pulse: 73 90 77 66  Resp: (!) 22 (!) 22  (!) 8  Temp:      TempSrc:      SpO2: 99% 98% 96% 93%  Weight:      Height:        -Most likely source being: urinary,      Patient meeting criteria for Severe sepsis with     evidence of end organ damage/organ dysfunction such as     elevated lactic acid >2     Component Value Date/Time   LATICACIDVEN 4.5 (HH) 06/28/2021 1403       - Obtain serial lactic acid and procalcitonin level.  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 Upon presentation creatinine was increased to 1.09. With IV fluids it has improved. Will continue to avoid nephrotoxic medications and hypotension. Monitor creatinine, electrolytes, and volume status.   Constipation The patient has had one enema without strong results the enema will be repeated.  CVA, old, hemiparesis ([place]) Noted. UTI is due to chronic suprapubic catheter. The suprapubic catheter was replaced in the ED. Urine culture is pending. The patient is receiving IV cefepime and vancomycin. MRSA screen has been ordered. Will stop vancomycin if it is negative. Continue lipitor and plavix. Lisinopril is held due to low normal blood pressures. Continue to monitor.   GERD (gastroesophageal reflux disease) Continue PPI.  HTN (hypertension) The patient is currently normotensive on no  antihypertensives. Lisionpril held. Continue to monitor.  Prolonged QT interval Avoid medications that may further prolong QT. Monitor with EKG. Monitor electrolytes.  Sepsis ([place])  In the ED he was found to have a fever of 100.3, tachycardia, lactic acid of 4.5, tachypnea, and metabolic acidosis. This was felt to be due to UTI. Blood pressures have been low normal. IV fluids have been continued. It is resolving.  Weakness Noted. Will order PT/OT.  Bladder spasm Due to UTI. Will use oxybutinin for furtehr complaints.   Subjective: The patient is resting comfortably. No new complaints.   Physical Exam: Vitals:   06/29/21 1000 06/29/21 1100 06/29/21 1133 06/29/21 1300  BP: 110/63 113/63  (!) 117/59  Pulse: 70 65 69 66  Resp: _0 Temp:      TempSrc:      SpO2: 95% 96% 96% 96%  Weight:      Height:       Exam:  Constitutional:  The patient is awake, alert, and oriented x 3. No acute distress. Respiratory:  No increased work of breathing. No wheezes, rales, or rhonchi No tactile fremitus Cardiovascular:  Regular rate and rhythm No murmurs, ectopy, or gallups. No lateral PMI. No thrills. Abdomen:  Abdomen is soft, non-tender, non-distended No hernias, masses, or organomegaly Normoactive bowel sounds.  Musculoskeletal:  No cyanosis, clubbing, or edema Skin:  No rashes, lesions, ulcers palpation of skin: no induration or nodules Neurologic:  CN 2-12 intact Left sided weakness. Psychiatric:  Mental status Mood, affect appropriate Orientation to person, place, time  judgment and insight appear intact   Data Reviewed:  Vitals  CBC, BMP  Culture results  EKG  CT Abdomen and Pelvis  Family Communication: None available  I have seen and examined this patient myself. I have spent 34 minutes in his evaluation and care.  Disposition: Status is: Inpatient Remains inpatient appropriate because: IV antibiotics, fecal impaction with obstipation,  generalized weakness.  PT/OT to eval and treat.   Planned Discharge Destination:  Likely to return to facility from which he presented.  DVT Prophylaxis:  SCD's CODE STATUS: Full Code Family Communication: None available Disposition:  Return to original facility.  Author: Charly Holcomb, DO 06/29/2021 1:19 PM  For on call review [email or web address].

## 2021-06-29 NOTE — Assessment & Plan Note (Addendum)
Resolved. The patient has had 3 BM's in the last 24 hours.

## 2021-06-29 NOTE — Assessment & Plan Note (Addendum)
Due to UTI. Patient states that it is no longer bothering him today.

## 2021-06-29 NOTE — Assessment & Plan Note (Signed)
The patient is currently normotensive on no antihypertensives. Lisionpril held. Continue to monitor.

## 2021-06-29 NOTE — ED Notes (Signed)
Breakfast Orders Placed °

## 2021-06-29 NOTE — Progress Notes (Signed)
Physical Therapy Evaluation Patient Details Name: Gregory Kerr MRN: [id number] DOB: 07-08-1940 Today's Date: 06/29/2021  History of Present Illness  81 yr old man who presents with complaints of abdominal pain. CT abdomen and pelvis has demonstrated obstipation with fecal impaction.  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 Workup ongoing for UTI. PMH: HTN, CVA with L spastic hemiplegia, indwelling suprapubic catheter, CHF.  Clinical Impression  Pt was seen for mobility on side of gurney, with pt demonstrating some difficulty tolerating sitting due to stiffness and restrictions of L LE extension tone.  He was stretched gently but has basically a PF contracture on ankle on L but potential to increase L knee flexion.  Pt is expected to follow up in SNF for care but will work to avoid further functional losses incurred from suprapubic catheter creating a UTI.  Pt has been in bed due to catheter discomfort, and hopes to work toward eventually returning to chair time out of bed upon return home.         Recommendations for follow up therapy are one component of a multi-disciplinary discharge planning process, led by the attending physician.  Recommendations may be updated based on patient status, additional functional criteria and insurance authorization.  Follow Up Recommendations Skilled nursing-short term rehab (<3 hours/day)    Assistance Recommended at Discharge Frequent or constant Supervision/Assistance  Patient can return home with the following  Two people to help with walking and/or transfers;Two people to help with bathing/dressing/bathroom    Equipment Recommendations None recommended by PT  Recommendations for Other Services       Functional Status Assessment Patient has had a recent decline in their functional status and/or demonstrates limited ability to make significant improvements in function in a reasonable  and predictable amount of time     Precautions / Restrictions Precautions Precautions: Other (comment) (suprapubic cath, painful) Required Braces or Orthoses: Other Brace (had Prevalon boots in SNF) Other Brace: prevalon boots in SNF Restrictions Weight Bearing Restrictions: No      Mobility  Bed Mobility Overal bed mobility: Needs Assistance Bed Mobility: Supine to Sit, Sit to Supine     Supine to sit: Total assist Sit to supine: Total assist        Transfers Overall transfer level: Needs assistance Equipment used: None               General transfer comment: unable to safely attempt to stand, not a baseline skill    Ambulation/Gait               General Gait Details: not ambulatory  Stairs            Wheelchair Mobility    Modified Rankin (Stroke Patients Only)       Balance Overall balance assessment: Needs assistance Sitting-balance support: Bilateral upper extremity supported, Single extremity supported Sitting balance-Leahy Scale: Zero Sitting balance - Comments: ROM on hips restricts his ability to sit as well as trunk mm shortening                                     Pertinent Vitals/Pain Pain Assessment Pain Assessment: Faces Faces Pain Scale: Hurts little more Pain Location: abdomen, L side generally Pain Descriptors / Indicators: Guarding, Grimacing, Tender Pain Intervention(s): Limited activity within patient's tolerance, Monitored during session, Repositioned    Home  Living Family/patient expects to be discharged to:: Skilled nursing facility                   Additional Comments: pt was in SNF care at wheelchair level for mobility, hoyer lift    Prior Function Prior Level of Function : Needs assist             Mobility Comments: prior to suprapubic catheter infection was self mobilizing on wheelchair ADLs Comments: required help with all bathing and dressing     Hand Dominance    Dominant Hand: Right (LUE in flexion contracture)    Extremity/Trunk Assessment   Upper Extremity Assessment Upper Extremity Assessment: Defer to OT evaluation RUE Deficits / Details: limitations in shoulder movement , making ADL tasks difficult; weak grasp but using functionally to help feed self RUE Coordination: decreased fine motor;decreased gross motor LUE Deficits / Details: spastic hemiplegia with flexion contractures; MASD elbow, axilladn hand LUE Coordination: decreased gross motor;decreased fine motor (non-functional)    Lower Extremity Assessment Lower Extremity Assessment: LLE deficits/detail LLE Deficits / Details: spastic hemiplegia with PF contracture L ankle and limited knee flexion due to strong extension tone LLE Coordination: decreased gross motor;decreased fine motor    Cervical / Trunk Assessment Cervical / Trunk Assessment: Other exceptions (postural tightness from habitual tone on LLE, in R sidebent posture) Cervical / Trunk Exceptions: struggling to sit midline, strong lean to R side with pt maintaining sitting only with min to mod assist  Communication   Communication: No difficulties  Cognition Arousal/Alertness: Awake/alert Behavior During Therapy: Anxious, WFL for tasks assessed/performed Overall Cognitive Status: History of cognitive impairments - at baseline                                 General Comments: pt is able to talk about PLOF but with some issues of exact timeframe        General Comments General comments (skin integrity, edema, etc.): pt has generally poor quality movement on LLE with Assisted ROM only, no active L ankle movement noted.  Assisted flexion to hip and knee on LLE were done with some repositioning to avoid skin pressure for the adduction tone that had both feet pressed together when PT arrived.    Exercises     Assessment/Plan    PT Assessment Patient needs continued PT services  PT Problem List Decreased  strength;Decreased range of motion;Decreased activity tolerance;Decreased balance;Decreased mobility;Decreased coordination;Decreased cognition;Decreased skin integrity;Pain;Decreased knowledge of precautions       PT Treatment Interventions DME instruction;Therapeutic exercise;Therapeutic activities;Patient/family education    PT Goals (Current goals can be found in the Care Plan section)  Acute Rehab PT Goals Patient Stated Goal: to be able to get up to wheelchair again PT Goal Formulation: With patient Time For Goal Achievement: 07/13/21 Potential to Achieve Goals: Fair    Frequency Min 2X/week     Co-evaluation               AM-PAC PT "6 Clicks" Mobility  Outcome Measure Help needed turning from your back to your side while in a flat bed without using bedrails?: Total Help needed moving from lying on your back to sitting on the side of a flat bed without using bedrails?: Total Help needed moving to and from a bed to a chair (including a wheelchair)?: Total Help needed standing up from a chair using your arms (e.g., wheelchair or bedside chair)?: Total  Help needed to walk in hospital room?: Total Help needed climbing 3-5 steps with a railing? : Total 6 Click Score: 6    End of Session   Activity Tolerance: Patient limited by fatigue;Treatment limited secondary to medical complications (Comment);Patient limited by pain Patient left: in bed;with call bell/phone within reach Nurse Communication: Mobility status;Other (comment) (discussed need for maintaining skin protection on feet but Prevalon boots are in SNF) PT Visit Diagnosis: Muscle weakness (generalized) (M62.81);Other symptoms and signs involving the nervous system (R29.898);Hemiplegia and hemiparesis;Pain Hemiplegia - Right/Left: Left Hemiplegia - dominant/non-dominant: Non-dominant Hemiplegia - caused by: Unspecified Pain - Right/Left: Left Pain - part of body: Ankle and joints of foot (sensitive to ROM)     Time: 2446-2863 PT Time Calculation (min) (ACUTE ONLY): 31 min   Charges:   PT Evaluation $PT Eval Moderate Complexity: 1 Mod PT Treatments $Therapeutic Activity: 8-22 mins       Ramond Dial 06/29/2021, 4:35 PM  Mee Hives, PT PhD Acute Rehab Dept. Number: [place] and [place]

## 2021-06-29 NOTE — Assessment & Plan Note (Addendum)
It appears that the baseline creatinine is 0.78 as of 11/2020. Creatinine today is 0.79. Upon presentation creatinine was increased to 1.09. With IV fluids it has improved. Will continue to avoid nephrotoxic medications and hypotension. Monitor creatinine, electrolytes, and volume status.

## 2021-06-29 NOTE — Assessment & Plan Note (Signed)
Noted. Will order PT/OT.

## 2021-06-29 NOTE — Assessment & Plan Note (Addendum)
Noted. UTI is due to chronic suprapubic catheter. The suprapubic catheter was replaced in the ED. Urine culture is pending. The patient is receiving IV cefepime and vancomycin. MRSA screen has been ordered. Will stop vancomycin if it is negative. Continue lipitor and plavix. Lisinopril is held due to normal blood pressures. Continue to monitor.

## 2021-06-29 NOTE — Assessment & Plan Note (Signed)
Avoid medications that may further prolong QT. Monitor with EKG. Monitor electrolytes.

## 2021-06-29 NOTE — Assessment & Plan Note (Addendum)
In the ED he was found to have a fever of 100.3, tachycardia, lactic acid of 4.5, tachypnea, and metabolic acidosis. This was felt to be due to UTI. Blood pressures have been low normal. IV fluids have been continued. It is resolved.

## 2021-06-29 NOTE — ED Notes (Signed)
Pt refused the soap enema, stated he already went twice , he does not need it anymore.

## 2021-06-30 LAB — MAGNESIUM: Magnesium: 1.8 mg/dL (ref 1.7–2.4)

## 2021-06-30 LAB — CBC WITH DIFFERENTIAL/PLATELET
Abs Immature Granulocytes: 0.02 10*3/uL (ref 0.00–0.07)
Basophils Absolute: 0 10*3/uL (ref 0.0–0.1)
Basophils Relative: 1 %
Eosinophils Absolute: 0.2 10*3/uL (ref 0.0–0.5)
Eosinophils Relative: 4 %
HCT: 33.9 % — ABNORMAL LOW (ref 39.0–52.0)
Hemoglobin: 10.8 g/dL — ABNORMAL LOW (ref 13.0–17.0)
Immature Granulocytes: 0 %
Lymphocytes Relative: 22 %
Lymphs Abs: 1.4 10*3/uL (ref 0.7–4.0)
MCH: 28.7 pg (ref 26.0–34.0)
MCHC: 31.9 g/dL (ref 30.0–36.0)
MCV: 90.2 fL (ref 80.0–100.0)
Monocytes Absolute: 0.5 10*3/uL (ref 0.1–1.0)
Monocytes Relative: 8 %
Neutro Abs: 4 10*3/uL (ref 1.7–7.7)
Neutrophils Relative %: 65 %
Platelets: 167 10*3/uL (ref 150–400)
RBC: 3.76 MIL/uL — ABNORMAL LOW (ref 4.22–5.81)
RDW: 15.3 % (ref 11.5–15.5)
WBC: 6.1 10*3/uL (ref 4.0–10.5)
nRBC: 0 % (ref 0.0–0.2)

## 2021-06-30 LAB — BASIC METABOLIC PANEL
Anion gap: 5 (ref 5–15)
BUN: 17 mg/dL (ref 8–23)
CO2: 21 mmol/L — ABNORMAL LOW (ref 22–32)
Calcium: 7.9 mg/dL — ABNORMAL LOW (ref 8.9–10.3)
Chloride: 111 mmol/L (ref 98–111)
Creatinine, Ser: 0.74 mg/dL (ref 0.61–1.24)
GFR, Estimated: >60 mL/min (ref >60)
Glucose, Bld: 93 mg/dL (ref 70–99)
Potassium: 3.7 mmol/L (ref 3.5–5.1)
Sodium: 137 mmol/L (ref 135–145)

## 2021-06-30 LAB — URINE CULTURE: Culture: >=100000 — AB

## 2021-06-30 LAB — OSMOLALITY: Osmolality: 289 mOsm/kg (ref 275–295)

## 2021-06-30 LAB — CK: Total CK: 66 U/L (ref 49–397)

## 2021-06-30 MED ORDER — ADULT MULTIVITAMIN W/MINERALS CH
1.0000 | ORAL_TABLET | Freq: Every day | ORAL | Status: DC
  Administered 2021-06-30 – 2021-07-02 (×3): 1 via ORAL
  Filled 2021-06-30 (×3): qty 1

## 2021-06-30 MED ORDER — CEFDINIR 300 MG PO CAPS
300.0000 mg | ORAL_CAPSULE | Freq: Two times a day (BID) | ORAL | Status: DC
  Administered 2021-06-30 – 2021-07-02 (×4): 300 mg via ORAL
  Filled 2021-06-30 (×4): qty 1

## 2021-06-30 MED ORDER — ENSURE ENLIVE PO LIQD
1.0000 | ORAL | Status: DC
  Administered 2021-06-30 – 2021-07-01 (×2): 237 mL via ORAL

## 2021-06-30 NOTE — TOC Initial Note (Signed)
Transition of Care [facility]) - Initial/Assessment Note    Patient Details  Name: Gregory Kerr MRN: [id number] Date of Birth: 06-10-1940  Transition of Care [facility]) CM/SW Contact:    Benard Halsted, LCSW Phone Number: 06/30/2021, 2:05 PM  Clinical Narrative:                 Patient reside at [facility]. CSW will continue to follow.   Expected Discharge Plan: Skilled Nursing Facility Barriers to Discharge: Continued Medical Work up   Patient Goals and CMS Choice   CMS Medicare.gov Compare Post Acute Care list provided to:: Patient Choice offered to / list presented to : Patient  Expected Discharge Plan and Services Expected Discharge Plan: [place] In-house Referral: Clinical Social Work   Post Acute Care Choice: [place] Living arrangements for the past 2 months: [place]                                      Prior Living Arrangements/Services Living arrangements for the past 2 months: [place] Lives with:: Facility Resident Patient language and need for interpreter reviewed:: Yes Do you feel safe going back to the place where you live?: Yes      Need for Family Participation in Patient Care: Yes (Comment) Care giver support system in place?: Yes (comment) Current home services: DME Criminal Activity/Legal Involvement Pertinent to Current Situation/Hospitalization: No - Comment as needed  Activities of Daily Living      Permission Sought/Granted Permission sought to share information with : Facility Sport and exercise psychologist, Family Supports Permission granted to share information with : Yes, Verbal Permission Granted     Permission granted to share info w AGENCY: [facility]        Emotional Assessment Appearance:: Appears stated age     Orientation: : Oriented to Self, Oriented to Place, Oriented to  Time, Oriented to Situation Alcohol / Substance Use: Not Applicable Psych Involvement: No  (comment)  Admission diagnosis:  Sepsis ([place]) [A41.9] Sepsis, due to unspecified organism, unspecified whether acute organ dysfunction present [facility]) [A41.9] Patient Active Problem List   Diagnosis Date Noted   Acute renal failure superimposed on stage 3a chronic kidney disease ([place]) 06/29/2021   Bladder spasm 06/29/2021   Sepsis ([place]) 06/28/2021   Spasticity 06/28/2021   Prolonged QT interval 06/28/2021   Constipation 06/28/2021   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 03/08/2021   Weakness    Acute CVA (cerebrovascular accident) ([place]) 11/19/2020   Allergic rhinitis due to allergen 04/27/2016   Chest pain 02/27/2016   Thoracic ascending aortic aneurysm 02/27/2016   Leukocytosis 02/27/2016   Major depression, chronic 02/16/2016   Neuropathic pain 02/16/2016   D (diarrhea) 12/29/2015   Coronary artery disease involving native coronary artery of native heart with angina pectoris ([place]) 11/12/2015   History of CVA with residual deficit 10/14/2015   Hyperlipidemia 07/16/2015   Hypertensive heart disease with congestive heart failure ([place]) 12/10/2014   Angina pectoris ([place]) 12/10/2014   Chronic obstructive pulmonary disease ([place]) 12/10/2014   Irritable bowel syndrome with diarrhea 12/10/2014   Senile osteoporosis 12/10/2014   CVA (cerebral vascular accident) ([place]) 12/10/2014   Acute on chronic diastolic CHF (congestive heart failure) ([place]) 09/27/2014   Hypokalemia 09/23/2014   Pancreatitis, gallstone 09/22/2014   Chronic diastolic heart failure ([place]) 09/22/2014   Acute cholangitis 09/22/2014   Gallstone pancreatitis 09/22/2014  Hiatal hernia with GERD 08/31/2014   Vitamin D deficiency 08/03/2014   Hyperlipidemia LDL goal <100 08/03/2014   CVA, old, hemiparesis ([place]) 08/03/2014   Leg edema, left 08/03/2014   Gastroesophageal reflux disease without esophagitis 06/18/2014   Essential hypertension, benign 06/18/2014   Primary generalized hypertrophic osteoarthrosis 06/18/2014    Left spastic hemiplegia ([place]) 06/18/2014   UTI (urinary tract infection) 04/04/2014   Essential hypertension 04/03/2014   Chronic airway obstruction, not elsewhere classified 01/15/2014   Dermatophytosis of nail 12/18/2013   Unspecified essential hypertension 12/18/2013   Generalized osteoarthrosis, unspecified site 12/18/2013   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

## 2021-06-30 NOTE — Progress Notes (Signed)
Initial Nutrition Assessment  DOCUMENTATION CODES:  Not applicable  INTERVENTION:  Continue current dies as ordered, encourage PO intake Ensure Enlive po 1x/d, each supplement provides 350 kcal and 20 grams of protein.  NUTRITION DIAGNOSIS:  Increased nutrient needs related to acute illness (infection/UTI) as evidenced by estimated needs.  GOAL:  Patient will meet greater than or equal to 90% of their needs  MONITOR:  PO intake  REASON FOR ASSESSMENT:  Consult Assessment of nutrition requirement/status  ASSESSMENT:  81 y.o. male with medical history of CVA, indwelling suprapubic catheter, CHF, HTN, and HLD presented to ED from his SNF with abdominal and suprapubic pain. In ER, concern for sepsis and noted to have dark urine with clots and a rash/discharge around the penis.   Imaging in ED showed constipation with fecal impaction. Aggressive bowel regimen was initiated with multiple enemas.   Pt resting in bed at the time of assessment. Reports feeling much better today. Denies GI distress or pain. Noted pt has foam noodles on tray for his utensils. States he does not use them at his facility, but they have been helpful so far. Contracted at baseline, particularly the left arm/hand.  Discussed recent nutrition hx. Pt reports that his intake is at baseline. Has not had any trouble ordering meals or getting food he enjoys while admitted. States that at his facility, he eats 3x/d and will have 1-2 snacks. Also drinks an ensure daily. Prefers vanilla ensure or chocolate boost. Will add 1x/d as this is what he consumes at baseline.    Stable weight noted x 1 year in chart, but most weights are stated. Pt denies any weight loss. Some mild muscle deficits noted, however, these are likely more a result of inactivity/decreased function. Pt is wheelchair bound at baseline.   Nutritionally Relevant Medications: Scheduled Meds:  atorvastatin  40 mg Oral QPM   baclofen  20 mg Oral TID    pantoprazole  20 mg Oral Daily   Continuous Infusions:  ceFEPime (MAXIPIME) IV 2 g (06/30/21 0530)   PRN Meds: bisacodyl  Labs Reviewed  NUTRITION - FOCUSED PHYSICAL EXAM: Flowsheet Row Most Recent Value  Orbital Region No depletion  Upper Arm Region Mild depletion  Thoracic and Lumbar Region No depletion  Buccal Region No depletion  Temple Region Mild depletion  Clavicle Bone Region No depletion  Clavicle and Acromion Bone Region No depletion  Scapular Bone Region No depletion  Dorsal Hand No depletion  Patellar Region Mild depletion  Anterior Thigh Region Mild depletion  Posterior Calf Region Mild depletion  Edema (RD Assessment) None  Hair Reviewed  Eyes Reviewed  Mouth Reviewed  Skin Reviewed  Nails Reviewed   Diet Order:   Diet Order             Diet Heart Room service appropriate? Yes; Fluid consistency: Thin  Diet effective now                   EDUCATION NEEDS:  No education needs have been identified at this time  Skin:  Skin Assessment: Reviewed RN Assessment  Last BM:  2/9 - type 4  Height:  Ht Readings from Last 1 Encounters:  06/28/21 5\' 6"  (1.676 m)    Weight:  Wt Readings from Last 1 Encounters:  06/28/21 74.8 kg    Ideal Body Weight:  64.5 kg  BMI:  Body mass index is 26.63 kg/m.  Estimated Nutritional Needs:  Kcal:  1800-2000 kcal/d Protein:  90-100g/d Fluid:  1.8-2 L/d  Ranell Patrick, RD, LDN Clinical Dietitian RD pager # available in [place]  After hours/weekend pager # available in [facility]

## 2021-06-30 NOTE — NC FL2 (Signed)
[place] LEVEL OF CARE SCREENING TOOL     IDENTIFICATION  Patient Name: Gregory Kerr Birthdate: 1941-03-11 Sex: male Admission Date (Current Location): 06/28/2021  [facility] and [place] Number:  Herbalist and Address:  [facility] [place] [place] [address], [place], [place] [address]      Provider Number: [id number]  Attending Physician Name and Address:  Karie Kirks, DO  Relative Name and Phone Number:       Current Level of Care: Hospital Recommended Level of Care: [place] Prior Approval Number:    Date Approved/Denied:   PASRR Number: [id number]  Discharge Plan: SNF    Current Diagnoses: Patient Active Problem List   Diagnosis Date Noted   Acute renal failure superimposed on stage 3a chronic kidney disease ([place]) 06/29/2021   Bladder spasm 06/29/2021   Sepsis ([place]) 06/28/2021   Spasticity 06/28/2021   Prolonged QT interval 06/28/2021   Constipation 06/28/2021   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 03/08/2021   Weakness    Acute CVA (cerebrovascular accident) ([place]) 11/19/2020   Allergic rhinitis due to allergen 04/27/2016   Chest pain 02/27/2016   Thoracic ascending aortic aneurysm 02/27/2016   Leukocytosis 02/27/2016   Major depression, chronic 02/16/2016   Neuropathic pain 02/16/2016   D (diarrhea) 12/29/2015   Coronary artery disease involving native coronary artery of native heart with angina pectoris ([place]) 11/12/2015   History of CVA with residual deficit 10/14/2015   Hyperlipidemia 07/16/2015   Hypertensive heart disease with congestive heart failure ([place]) 12/10/2014   Angina pectoris ([place]) 12/10/2014   Chronic obstructive pulmonary disease ([place]) 12/10/2014   Irritable bowel syndrome with diarrhea 12/10/2014   Senile osteoporosis 12/10/2014   CVA (cerebral vascular accident) ([place]) 12/10/2014   Acute on chronic diastolic CHF (congestive heart failure) ([place]) 09/27/2014   Hypokalemia  09/23/2014   Pancreatitis, gallstone 09/22/2014   Chronic diastolic heart failure ([place]) 09/22/2014   Acute cholangitis 09/22/2014   Gallstone pancreatitis 09/22/2014   Hiatal hernia with GERD 08/31/2014   Vitamin D deficiency 08/03/2014   Hyperlipidemia LDL goal <100 08/03/2014   CVA, old, hemiparesis ([place]) 08/03/2014   Leg edema, left 08/03/2014   Gastroesophageal reflux disease without esophagitis 06/18/2014   Essential hypertension, benign 06/18/2014   Primary generalized hypertrophic osteoarthrosis 06/18/2014   Left spastic hemiplegia ([place]) 06/18/2014   UTI (urinary tract infection) 04/04/2014   Essential hypertension 04/03/2014   Chronic airway obstruction, not elsewhere classified 01/15/2014   Dermatophytosis of nail 12/18/2013   Unspecified essential hypertension 12/18/2013   Generalized osteoarthrosis, unspecified site 12/18/2013   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   GERD (gastroesophageal reflux disease) 08/11/2013   Disuse osteoporosis 08/11/2013   Chronic pain syndrome 01/30/2013   IBS (irritable bowel syndrome) 01/30/2013   Cerebral infarct ([place]) 10/24/2012   HTN (hypertension) 10/24/2012   Osteoporosis 10/24/2012   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   Other and unspecified hyperlipidemia 10/24/2012   Hiatal hernia 10/24/2012    Orientation RESPIRATION BLADDER Height & Weight     Self, Time, Place, Situation  Normal Incontinent, Indwelling catheter Weight: 165 lb (74.8 kg) Height:  5\' 6"  (167.6 cm)  BEHAVIORAL SYMPTOMS/MOOD NEUROLOGICAL BOWEL NUTRITION STATUS      Continent Diet (see dc summary)  AMBULATORY STATUS COMMUNICATION OF NEEDS Skin   Extensive Assist Verbally Normal                       Personal  Care Assistance Level of Assistance  Bathing, Feeding, Dressing Bathing Assistance: Maximum assistance Feeding assistance: Maximum assistance Dressing Assistance: Limited assistance     Functional Limitations Info              SPECIAL CARE FACTORS FREQUENCY                       Contractures Contractures Info: Not present    Additional Factors Info  Code Status, Allergies Code Status Info: Full Allergies Info: Penicillins           Current Medications (06/30/2021):  This is the current hospital active medication list Current Facility-Administered Medications  Medication Dose Route Frequency Provider Last Rate Last Admin   acetaminophen (TYLENOL) tablet 650 mg  650 mg Oral Q6H PRN Toy Baker, MD       Or   acetaminophen (TYLENOL) suppository 650 mg  650 mg Rectal Q6H PRN Doutova, Anastassia, MD       atorvastatin (LIPITOR) tablet 40 mg  40 mg Oral QPM Doutova, Anastassia, MD   40 mg at 06/29/21 1733   baclofen (LIORESAL) tablet 20 mg  20 mg Oral TID Toy Baker, MD   20 mg at 06/30/21 0837   bisacodyl (DULCOLAX) EC tablet 5 mg  5 mg Oral Daily PRN Toy Baker, MD       ceFEPIme (MAXIPIME) 2 g in sodium chloride 0.9 % 100 mL IVPB  2 g Intravenous Q8H Doutova, Anastassia, MD 200 mL/hr at 06/30/21 1303 2 g at 06/30/21 1303   clopidogrel (PLAVIX) tablet 75 mg  75 mg Oral Daily Doutova, Nyoka Lint, MD   75 mg at 06/30/21 7494   Dextromethorphan-quiNIDine (NUEDEXTA) 20-10 MG per capsule 1 capsule  1 capsule Oral Daily Toy Baker, MD   1 capsule at 06/30/21 0838   feeding supplement (ENSURE ENLIVE / ENSURE PLUS) liquid 237 mL  1 Bottle Oral Q24H Swayze, Ava, DO   237 mL at 06/30/21 1305   gabapentin (NEURONTIN) capsule 100 mg  100 mg Oral TID Toy Baker, MD   100 mg at 06/30/21 4967   HYDROcodone-acetaminophen (NORCO/VICODIN) 5-325 MG per tablet 1-2 tablet  1-2 tablet Oral Q4H PRN Toy Baker, MD       mirabegron ER (MYRBETRIQ) tablet 25 mg  25 mg Oral QPM Doutova, Anastassia, MD       multivitamin with minerals tablet 1 tablet  1 tablet Oral Daily Swayze, Ava, DO   1 tablet at 06/30/21 1259   pantoprazole (PROTONIX) EC tablet 20 mg  20 mg Oral  Daily Doutova, Anastassia, MD   20 mg at 06/30/21 5916   phenazopyridine (PYRIDIUM) tablet 100 mg  100 mg Oral Q8H PRN Toy Baker, MD   100 mg at 06/30/21 0838   traMADol (ULTRAM) tablet 50 mg  50 mg Oral BID Toy Baker, MD   50 mg at 06/30/21 0837   traMADol (ULTRAM) tablet 50 mg  50 mg Oral Daily PRN Toy Baker, MD         Discharge Medications: Please see discharge summary for a list of discharge medications.  Relevant Imaging Results:  Relevant Lab Results:   Additional Information SS#: [id number]  Benard Halsted, LCSW

## 2021-06-30 NOTE — Care Management Important Message (Signed)
Important Message  Patient Details  Name: Gregory Kerr MRN: [id number] Date of Birth: [birth year]   Medicare Important Message Given:  Yes     [name] 06/30/2021, 4:22 PM

## 2021-06-30 NOTE — Progress Notes (Signed)
Progress Note   Patient: Gregory Kerr NLG:[id number] DOB: [birth year] DOA: 06/28/2021     2 DOS: the patient was seen and examined on 06/30/2021   Brief hospital course: The patient is a 81 yr old man who presented to [facility] ED on 06/28/2021 with complaints of abdominal pain. 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 In the ED he was found to have a fever of 100.3, tachycardia, lactic acid of 4.5, tachypnea, and metabolic acidosis. This was felt to be due to UTI. Urinalysis is suggestive of UTI. Urine culture has been collected, but is pending. Blood cultures x 2 have been collected and have had no growth.   CT abdomen and pelvis has demonstrated obstipation with fecal impaction.   [facility] hospitalists were consulted to admit the patient for further evaluation and care.  The patient is receiving IV fluids and IV cefepime and vancomycin. This has been converted to oral cefdini.    The patient has had one enema without strong results the enema will be repeated.  Severe Sepsis resolved.  Patient denies bladder spasms.  Assessment and Plan: HTN (hypertension) Allow permissive hypertension  Sepsis ([place])  -SIRS criteria met with elevated white blood cell count,       Component Value Date/Time   WBC 11.1 (H) 06/28/2021 1403   LYMPHSABS 2.0 06/28/2021 1403   fever   RR >20 Today's Vitals   06/28/21 2030 06/28/21 2100 06/28/21 2115 06/28/21 2215  BP: (!) 113/53 (!) 151/78 119/64 (!) 108/55  Pulse: 73 90 77 66  Resp: (!) 22 (!) 22  (!) 8  Temp:      TempSrc:      SpO2: 99% 98% 96% 93%  Weight:      Height:      PainSc:          Vitals:   06/28/21 2030 06/28/21 2100 06/28/21 2115 06/28/21 2215  BP: (!) 113/53 (!) 151/78 119/64 (!) 108/55  Pulse: 73 90 77 66  Resp: (!) 22 (!) 22  (!) 8  Temp:      TempSrc:      SpO2: 99% 98% 96% 93%  Weight:      Height:        -Most likely source being: urinary,      Patient  meeting criteria for Severe sepsis with    evidence of end organ damage/organ dysfunction such as     elevated lactic acid >2     Component Value Date/Time   LATICACIDVEN 4.5 (HH) 06/28/2021 1403       - Obtain serial lactic acid and procalcitonin level.  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 Creatinine today is 0.79. Upon presentation creatinine was increased to 1.09. With IV fluids it has improved. Will continue to avoid nephrotoxic medications and hypotension. Monitor creatinine, electrolytes, and volume status.   Constipation Resolved. The patient has had 3 BM's in the last 24 hours.  CVA, old, hemiparesis ([place]) Noted. UTI is due to chronic suprapubic catheter. The suprapubic catheter was replaced in the ED. Urine culture is pending. The patient is receiving IV cefepime and vancomycin. MRSA screen has been ordered. Will stop vancomycin if it is negative. Continue lipitor and plavix. Lisinopril is held due to normal blood pressures. Continue to monitor.   GERD (gastroesophageal reflux disease) Continue PPI.  HTN (hypertension) The patient is  currently normotensive on no antihypertensives. Lisionpril held. Continue to monitor.  Prolonged QT interval Avoid medications that may further prolong QT. Monitor with EKG. Monitor electrolytes.  Sepsis ([place])  In the ED he was found to have a fever of 100.3, tachycardia, lactic acid of 4.5, tachypnea, and metabolic acidosis. This was felt to be due to UTI. Blood pressures have been low normal. IV fluids have been continued. It is resolved.  Weakness Noted. Will order PT/OT.  Bladder spasm Due to UTI. Patient states that it is no longer bothering him today.  Subjective: The patient is resting comfortably. No new complaints.   Physical Exam: Vitals:   06/30/21 0316 06/30/21 0737 06/30/21 1218 06/30/21 1618  BP: (!) 118/57 (!) 113/58 123/65 122/62  Pulse: 64 61 65 71  Resp: _0 Temp: 98.6 F (37 C) 97.6 F (36.4 C) (!) 97.5 F (36.4 C) 98.5 F (36.9 C)  TempSrc: Oral Oral Oral Oral  SpO2: 96% 95% 94% 94%  Weight:      Height:       Exam:  Constitutional:  The patient is awake, alert, and oriented x 3. No acute distress. Respiratory:  No increased work of breathing. No wheezes, rales, or rhonchi No tactile fremitus Cardiovascular:  Regular rate and rhythm No murmurs, ectopy, or gallups. No lateral PMI. No thrills. Abdomen:  Abdomen is soft, non-tender, non-distended No hernias, masses, or organomegaly Normoactive bowel sounds.  Musculoskeletal:  No cyanosis, clubbing, or edema Skin:  No rashes, lesions, ulcers palpation of skin: no induration or nodules Neurologic:  CN 2-12 intact Left sided weakness. Psychiatric:  Mental status Mood, affect appropriate Orientation to person, place, time  judgment and insight appear intact   Data Reviewed:  Vitals  CBC, BMP  Culture results  EKG  CT Abdomen and Pelvis  Family Communication: None available  I have seen and examined this patient myself. I have spent 34 minutes in his evaluation and  care.  Disposition: Status is: Inpatient Remains inpatient appropriate because: IV antibiotics, fecal impaction with obstipation, generalized weakness.  PT/OT to eval and treat.   Planned Discharge Destination:  Likely to return to facility from which he presented.  DVT Prophylaxis:  SCD's CODE STATUS: Full Code Family Communication: None available Disposition:  Return to original facility.  Author: Lindzee Gouge, DO 06/30/2021 6:44 PM  For on call review [email or web address].

## 2021-07-01 LAB — CBC WITH DIFFERENTIAL/PLATELET
Abs Immature Granulocytes: 0.01 10*3/uL (ref 0.00–0.07)
Basophils Absolute: 0 10*3/uL (ref 0.0–0.1)
Basophils Relative: 1 %
Eosinophils Absolute: 0.2 10*3/uL (ref 0.0–0.5)
Eosinophils Relative: 3 %
HCT: 34.8 % — ABNORMAL LOW (ref 39.0–52.0)
Hemoglobin: 11.5 g/dL — ABNORMAL LOW (ref 13.0–17.0)
Immature Granulocytes: 0 %
Lymphocytes Relative: 20 %
Lymphs Abs: 1.2 10*3/uL (ref 0.7–4.0)
MCH: 29 pg (ref 26.0–34.0)
MCHC: 33 g/dL (ref 30.0–36.0)
MCV: 87.9 fL (ref 80.0–100.0)
Monocytes Absolute: 0.5 10*3/uL (ref 0.1–1.0)
Monocytes Relative: 8 %
Neutro Abs: 4 10*3/uL (ref 1.7–7.7)
Neutrophils Relative %: 68 %
Platelets: 176 10*3/uL (ref 150–400)
RBC: 3.96 MIL/uL — ABNORMAL LOW (ref 4.22–5.81)
RDW: 14.7 % (ref 11.5–15.5)
WBC: 5.9 10*3/uL (ref 4.0–10.5)
nRBC: 0 % (ref 0.0–0.2)

## 2021-07-01 LAB — BASIC METABOLIC PANEL
Anion gap: 6 (ref 5–15)
BUN: 18 mg/dL (ref 8–23)
CO2: 22 mmol/L (ref 22–32)
Calcium: 8.5 mg/dL — ABNORMAL LOW (ref 8.9–10.3)
Chloride: 109 mmol/L (ref 98–111)
Creatinine, Ser: 0.54 mg/dL — ABNORMAL LOW (ref 0.61–1.24)
GFR, Estimated: >60 mL/min (ref >60)
Glucose, Bld: 115 mg/dL — ABNORMAL HIGH (ref 70–99)
Potassium: 3.9 mmol/L (ref 3.5–5.1)
Sodium: 137 mmol/L (ref 135–145)

## 2021-07-01 MED ORDER — CEFDINIR 300 MG PO CAPS: 300.0000 mg | ORAL_CAPSULE | Freq: Two times a day (BID) | ORAL | 0 refills | Status: AC

## 2021-07-01 NOTE — Discharge Summary (Addendum)
Physician Discharge Summary   Patient: Gregory Kerr MRN: [id number] DOB: Feb 19, 1941  Admit date:     06/28/2021  Discharge date: 07/02/2021  Discharge Physician: Karie Kirks   PCP: Virgel Bouquet, MD   Recommendations at discharge:    Severe sepsis Catheter acquired UTI (POA) CHF Hypertension Hyperlipidemia Prolonged QTc Bladder spasticity  Discharge Diagnoses: Principal Problem:   Sepsis ([place]) Active Problems:   HTN (hypertension)   GERD (gastroesophageal reflux disease)   CVA, old, hemiparesis ([place])   Weakness   Prolonged QT interval   Constipation   Acute renal failure superimposed on stage 3a chronic kidney disease (HCC)   Bladder spasm  Resolved Problems:   * No resolved hospital problems. [facility] Course: The patient is a 81 yr old man who presented to [place] ED on 06/28/2021 with complaints of abdominal pain. 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 In the ED he was found to have a fever of 100.3, tachycardia, lactic acid of 4.5, tachypnea, and metabolic acidosis. This was felt to be due to UTI. Urinalysis is suggestive of UTI. Urine culture has been collected, but is pending. Blood cultures x 2 have been collected and have had no growth.    CT abdomen and pelvis has demonstrated obstipation with fecal impaction.    [facility] were consulted to admit the patient for further evaluation and care.   The patient is receiving IV fluids and IV cefepime and vancomycin. Will screen for MRSA.    The patient has had one enema without strong results the enema will be repeated.   Severe Sepsis resolved.  Urine culture has grown out > 100 CFU Corynebacterium. Could be considered contaminant, but the patient was severely symptomatic and has greatly improved with antibiotics. I have discussed with pharmacy. Will discharge the patient on 4 more days of cefdinir by mouth.   Assessment and Plan: *  Sepsis ([place])- (present on admission)  In the ED he was found to have a fever of 100.3, tachycardia, lactic acid of 4.5, tachypnea, and metabolic acidosis. This was felt to be due to UTI. Blood pressures have been low normal. IV fluids have been continued. It is resolved.  Bladder spasm- (present on admission) Due to UTI. Patient states that it is no longer bothering him today.  Acute renal failure superimposed on stage 3a chronic kidney disease ([place])- (present on admission) It appears that the baseline creatinine is 0.78 as of 11/2020. Creatinine today is 0.79. Upon presentation creatinine was increased to 1.09. With IV fluids it has improved. Will continue to avoid nephrotoxic medications and hypotension. Monitor creatinine, electrolytes, and volume status.   Constipation- (present on admission) Resolved. The patient has had 3 BM's in the last 24 hours.  Prolonged QT interval- (present on admission) Avoid medications that may further prolong QT. Monitor with EKG. Monitor electrolytes.  Weakness Noted. Will order PT/OT.  CVA, old, hemiparesis ([place]) Noted. UTI is due to chronic suprapubic catheter. The suprapubic catheter was replaced in the ED. Urine culture is pending. The patient is receiving IV cefepime and vancomycin. MRSA screen has been ordered. Will stop vancomycin if it is negative. Continue lipitor and plavix. Lisinopril is held due to normal blood pressures. Continue to monitor.   GERD (gastroesophageal reflux disease)- (present on admission) Continue PPI.  HTN (hypertension)- (present on admission) The patient is currently normotensive on no antihypertensives. Lisionpril held. Continue to monitor.   BMI 26.63.  Consultants: None Procedures performed: SPC replaced  Disposition: Long term care facility Diet recommendation:  Discharge Diet Orders (From admission, onward)     Start     Ordered   07/01/21 0000  Diet - low sodium heart healthy        07/01/21 1251            Cardiac diet  DISCHARGE MEDICATION: Allergies as of 07/01/2021       Reactions   Penicillins Rash   ++++Patient in nursing home. Unable to clarify details/severity of the reaction.++++        Medication List     STOP taking these medications    lisinopril 10 MG tablet Commonly known as: ZESTRIL   mirabegron ER 25 MG Tb24 tablet Commonly known as: MYRBETRIQ   sulfamethoxazole-trimethoprim 800-160 MG tablet Commonly known as: BACTRIM DS       TAKE these medications    atorvastatin 40 MG tablet Commonly known as: LIPITOR Take 1 tablet (40 mg total) by mouth daily. What changed: when to take this   baclofen 20 MG tablet Commonly known as: LIORESAL Take 20 mg by mouth 3 (three) times daily.   bisacodyl 10 MG suppository Commonly known as: DULCOLAX Place 10 mg rectally daily as needed for moderate constipation.   BOOST PO Take 1 Container by mouth daily. Lactose-Reduced 0..04 g/1 kcal/ml liquid   calcium-vitamin D 500-200 MG-UNIT tablet Commonly known as: OSCAL WITH D Take 1 tablet by mouth 2 (two) times daily.   cefdinir 300 MG capsule Commonly known as: OMNICEF Take 1 capsule (300 mg total) by mouth every 12 (twelve) hours for 4 days.   clopidogrel 75 MG tablet Commonly known as: PLAVIX Take 1 tablet (75 mg total) by mouth daily.   Dextromethorphan-quiNIDine 20-10 MG capsule Commonly known as: NUEDEXTA Take 1 capsule by mouth daily. For Pseudobulbar Affect (PBA)   diclofenac Sodium 1 % Gel Commonly known as: VOLTAREN Apply 2 g topically in the morning, at noon, and at bedtime. To right shoulder   gabapentin 100 MG capsule Commonly known as: NEURONTIN Take 100 mg by mouth 3 (three) times daily.   guaiFENesin 600 MG 12 hr tablet Commonly known as: MUCINEX Take 600 mg by mouth 2 (two) times daily.   hydroxypropyl methylcellulose / hypromellose 2.5 % ophthalmic solution Commonly known as: ISOPTO TEARS / GONIOVISC Place 1 drop into both eyes  3 (three) times daily.   loperamide 2 MG tablet Commonly known as: IMODIUM A-D Take 4 mg by mouth 4 (four) times daily as needed for diarrhea or loose stools.   loratadine 10 MG tablet Commonly known as: CLARITIN Take 10 mg by mouth daily.   LORazepam 0.5 MG tablet Commonly known as: ATIVAN Take 0.5 mg by mouth 2 (two) times daily as needed for anxiety.   Miconazole-Zinc Oxide-Petrolat 0.25-15-81.35 % Oint Apply 1 application topically every 4 (four) hours. To penis and groin area   mupirocin ointment 2 % Commonly known as: BACTROBAN Apply 1 application topically 2 (two) times daily. Suprapubic site   nitroGLYCERIN 0.4 MG SL tablet Commonly known as: NITROSTAT Place 0.4 mg under the tongue every 5 (five) minutes as needed for chest pain.   nystatin powder Commonly known as: MYCOSTATIN/NYSTOP Apply 1 application topically 3 (three) times daily. To bilateral groin, penis, and abdominal folds   pantoprazole 20 MG tablet Commonly known as: PROTONIX Take 20 mg by mouth daily.   phenazopyridine 100 MG tablet Commonly known as: PYRIDIUM Take 100 mg by  mouth every 8 (eight) hours as needed (bladder spasms).   polyethylene glycol 17 g packet Commonly known as: MIRALAX / GLYCOLAX Take 17 g by mouth daily as needed (constipation).   PrePLUS 27-1 MG Tabs Take 1 tablet by mouth daily.   Prolia 60 MG/ML Sosy injection Generic drug: denosumab Inject 60 mg into the skin every 6 (six) months.   sertraline 100 MG tablet Commonly known as: ZOLOFT Take 100 mg by mouth daily.   solifenacin 5 MG tablet Commonly known as: VESICARE Take 1 tablet (5 mg total) by mouth daily.   tamsulosin 0.4 MG Caps capsule Commonly known as: FLOMAX Take 1 capsule (0.4 mg total) by mouth at bedtime.   traMADol 50 MG tablet Commonly known as: ULTRAM Take 50 mg by mouth See admin instructions. Take 50 mg twice daily scheduled, may take a 3rd 50 mg dose as needed for pain   vitamin A & D  ointment Apply 1 application topically in the morning, at noon, in the evening, and at bedtime. To abdominal folds, penis and scrotum along with nystatin         Discharge Exam: Filed Weights   06/28/21 1423  Weight: 74.8 kg   Vitals:   07/01/21 0743 07/01/21 1154  BP: (!) 117/59 (!) 130/52  Pulse: 65 70  Resp: 19 19  Temp: 97.6 F (36.4 C) 98 F (36.7 C)  SpO2: 98% 94%   Exam:  Constitutional:  The patient is awake, alert, and oriented x 3. No acute distress. Respiratory:  No increased work of breathing. No wheezes, rales, or rhonchi No tactile fremitus Cardiovascular:  Regular rate and rhythm No murmurs, ectopy, or gallups. No lateral PMI. No thrills. Abdomen:  Abdomen is soft, non-tender, non-distended No hernias, masses, or organomegaly Normoactive bowel sounds.  Musculoskeletal:  No cyanosis, clubbing, or edema Skin:  No rashes, lesions, ulcers palpation of skin: no induration or nodules Neurologic:  CN 2-12 intact Sensation all 4 extremities intact Psychiatric:  Mental status Mood, affect appropriate Orientation to person, place, time  judgment and insight appear intact   Condition at discharge: fair  The results of significant diagnostics from this hospitalization (including imaging, microbiology, ancillary and laboratory) are listed below for reference.   Imaging Studies: CT ABDOMEN PELVIS W CONTRAST  Result Date: 06/28/2021 CLINICAL DATA:  Acute abdominal pain EXAM: CT ABDOMEN AND PELVIS WITH CONTRAST TECHNIQUE: Multidetector CT imaging of the abdomen and pelvis was performed using the standard protocol following bolus administration of intravenous contrast. RADIATION DOSE REDUCTION: This exam was performed according to the departmental dose-optimization program which includes automated exposure control, adjustment of the mA and/or kV according to patient size and/or use of iterative reconstruction technique. CONTRAST:  52mL OMNIPAQUE IOHEXOL 300  MG/ML  SOLN COMPARISON:  09/22/2014 FINDINGS: Lower chest: No acute pleural or parenchymal lung disease. Large hiatal hernia unchanged. Hepatobiliary: No focal liver abnormality is seen. Status post cholecystectomy. No biliary dilatation. Evaluation limited by respiratory motion. Pancreas: Unremarkable. No pancreatic ductal dilatation or surrounding inflammatory changes. Spleen: Normal in size without focal abnormality. Adrenals/Urinary Tract: Evaluation of the right kidney is limited due to respiratory motion. Bilateral renal cortical thinning. Small left renal cortical cyst. No urinary tract calculi or obstructive uropathy. There is irregular bladder wall thickening measuring up to 1.6 cm posteriorly, with indwelling suprapubic catheter identified. There are either calcified concretions, mural calcifications, or bladder calculi identified adjacent to the tip of the Foley catheter, measuring up to 1.2 cm in size. The adrenals are  unremarkable. Stomach/Bowel: No bowel obstruction or ileus. Moderate retained stool throughout the colon, with prominent stool in the rectal vault insistent with fecal impaction. The appendix is surgically absent. No bowel wall thickening or inflammatory change. Vascular/Lymphatic: Aortic atherosclerosis. No enlarged abdominal or pelvic lymph nodes. Reproductive: Stable enlargement the prostate measuring proximally 5.3 x 3.8 cm. Other: No free fluid or free gas. There are large bilateral inguinal hernias, with the right inguinal hernia containing multiple loops of distal ileum. No evidence of incarceration or bowel obstruction. Musculoskeletal: No acute or destructive bony lesions. Reconstructed images demonstrate no additional findings. IMPRESSION: 1. Large right inguinal hernia containing a segment of distal ileum. No incarceration or bowel obstruction. 2. Fat containing left inguinal hernia. 3. Large hiatal hernia. 4. Irregular bladder wall thickening measuring up to 1.6 cm. This may be  due to chronic bladder outlet obstruction given enlarged prostate and indwelling suprapubic catheter. If neoplasm is a concern, cystoscopy could be performed. 5. Calcified concretions within the bladder loop, surrounding the tip of the Foley catheter. 6. Moderate retained stool throughout the colon, with probable fecal impaction within the rectal vault. 7.  Aortic Atherosclerosis (ICD10-I70.0). Electronically Signed   By: Randa Ngo M.D.   On: 06/28/2021 16:19   DG Chest Port 1 View  Result Date: 06/28/2021 CLINICAL DATA:  Questionable sepsis. EXAM: PORTABLE CHEST 1 VIEW COMPARISON:  CT chest dated November 19, 2020. Chest x-ray dated February 27, 2016. FINDINGS: Normal heart size. Unchanged large hiatal hernia. Chronic left basilar atelectasis/scarring. No focal consolidation, pleural effusion, or pneumothorax. No acute osseous abnormality. IMPRESSION: 1. No active disease. 2. Unchanged large hiatal hernia. Electronically Signed   By: Titus Dubin M.D.   On: 06/28/2021 14:19    Microbiology: Results for orders placed or performed during the hospital encounter of 06/28/21  Blood culture (routine x 2)     Status: None (Preliminary result)   Collection Time: 06/28/21  2:03 PM   Specimen: BLOOD RIGHT HAND  Result Value Ref Range Status   Specimen Description BLOOD RIGHT HAND  Final   Special Requests   Final    BOTTLES DRAWN AEROBIC AND ANAEROBIC Blood Culture results may not be optimal due to an excessive volume of blood received in culture bottles   Culture   Final    NO GROWTH 3 DAYS Performed at [facility], [place] [address] [place], [place] [address]    Report Status PENDING  Incomplete  Resp Panel by RT-PCR (Flu A&B, Covid) Nasopharyngeal Swab     Status: None   Collection Time: 06/28/21  8:40 PM   Specimen: Nasopharyngeal Swab; Nasopharyngeal(NP) swabs in vial transport medium  Result Value Ref Range Status   SARS Coronavirus 2 by RT PCR NEGATIVE NEGATIVE Final    Comment:  (NOTE) SARS-CoV-2 target nucleic acids are NOT DETECTED.  The SARS-CoV-2 RNA is generally detectable in upper respiratory specimens during the acute phase of infection. The lowest concentration of SARS-CoV-2 viral copies this assay can detect is 138 copies/mL. A negative result does not preclude SARS-Cov-2 infection and should not be used as the sole basis for treatment or other patient management decisions. A negative result may occur with  improper specimen collection/handling, submission of specimen other than nasopharyngeal swab, presence of viral mutation(s) within the areas targeted by this assay, and inadequate number of viral copies(<138 copies/mL). A negative result must be combined with clinical observations, patient history, and epidemiological information. The expected result is Negative.  Fact Sheet for Patients:  EntrepreneurPulse.com.au  Fact Sheet for Healthcare Providers:  IncredibleEmployment.be  This test is no t yet approved or cleared by the [place] FDA and  has been authorized for detection and/or diagnosis of SARS-CoV-2 by FDA under an Emergency Use Authorization (EUA). This EUA will remain  in effect (meaning this test can be used) for the duration of the COVID-19 declaration under Section 564(b)(1) of the Act, 21 U.S.C.section 360bbb-3(b)(1), unless the authorization is terminated  or revoked sooner.       Influenza A by PCR NEGATIVE NEGATIVE Final   Influenza B by PCR NEGATIVE NEGATIVE Final    Comment: (NOTE) The Xpert Xpress SARS-CoV-2/FLU/RSV plus assay is intended as an aid in the diagnosis of influenza from Nasopharyngeal swab specimens and should not be used as a sole basis for treatment. Nasal washings and aspirates are unacceptable for Xpert Xpress SARS-CoV-2/FLU/RSV testing.  Fact Sheet for Patients: EntrepreneurPulse.com.au  Fact Sheet for Healthcare  Providers: IncredibleEmployment.be  This test is not yet approved or cleared by the [place] FDA and has been authorized for detection and/or diagnosis of SARS-CoV-2 by FDA under an Emergency Use Authorization (EUA). This EUA will remain in effect (meaning this test can be used) for the duration of the COVID-19 declaration under Section 564(b)(1) of the Act, 21 U.S.C. section 360bbb-3(b)(1), unless the authorization is terminated or revoked.  Performed at [facility], [place] [address] [place], [place] [address]   Urine Culture     Status: Abnormal   Collection Time: 06/28/21  9:04 PM   Specimen: Urine, Clean Catch  Result Value Ref Range Status   Specimen Description URINE, CLEAN CATCH  Final   Special Requests NONE  Final   Culture (A)  Final    >=100,000 COLONIES/mL DIPHTHEROIDS(CORYNEBACTERIUM SPECIES) Standardized susceptibility testing for this organism is not available. Performed at [facility], [place] [address] [place], [place] [address]    Report Status 06/30/2021 FINAL  Final  Blood culture (routine x 2)     Status: None (Preliminary result)   Collection Time: 06/28/21 10:21 PM   Specimen: BLOOD  Result Value Ref Range Status   Specimen Description BLOOD RIGHT ANTECUBITAL  Final   Special Requests AEROBIC BOTTLE ONLY Blood Culture adequate volume  Final   Culture   Final    NO GROWTH 3 DAYS Performed at [place] [address], [place],  [address]    Report Status PENDING  Incomplete    Labs: CBC: Recent Labs  Lab 06/28/21 1403 06/28/21 1531 06/29/21 0530 06/30/21 0311 07/01/21 0225  WBC 11.1*  --  7.4 6.1 5.9  NEUTROABS 8.3*  --  5.0 4.0 4.0  HGB 14.0 14.3 12.1* 10.8* 11.5*  HCT 43.0 42.0 37.2* 33.9* 34.8*  MCV 89.6  --  90.7 90.2 87.9  PLT 271  --  206 167 644   Basic Metabolic Panel: Recent Labs  Lab 06/28/21 1403 06/28/21 1531 06/29/21 0530 06/30/21 0311 07/01/21 0225  NA 140 142 139 137  137  K 4.6 4.4 3.7 3.7 3.9  CL 109 110 111 111 109  CO2 18*  --  21* 21* 22  GLUCOSE 107* 101* 82 93 115*  BUN 29* 29* 22 17 18   CREATININE 1.08 0.90 0.82 0.74 0.54*  CALCIUM 9.3  --  8.2* 7.9* 8.5*  MG  --   --  1.7 1.8  --   PHOS  --   --  2.5  --   --  Liver Function Tests: Recent Labs  Lab 06/28/21 1403 06/29/21 0530  AST 33 30  ALT 33 30  ALKPHOS 78 62  BILITOT 0.5 0.7  PROT 6.9 5.6*  ALBUMIN 3.2* 2.7*   CBG: No results for input(s): GLUCAP in the last 168 hours.  Discharge time spent: greater than 30 minutes.  Signed: Norman Bier, DO [facility] 07/02/2021 10:16 AM

## 2021-07-01 NOTE — Progress Notes (Signed)
Attempted to call report 2x to the [facility].  Awaiting call back.

## 2021-07-01 NOTE — TOC Transition Note (Addendum)
Transition of Care [facility]) - CM/SW Discharge Note   Patient Details  Name: Gregory Kerr MRN: [id number] Date of Birth: 1940-08-23  [facility] CM/SW Contact:  Benard Halsted, LCSW Phone Number: 07/01/2021, 1:34 PM   Clinical Narrative:    Patient will DC to: [facility] Anticipated DC date: 07/01/21 Family notified: Pt notifying sister Transport by: Corey Harold   Per MD patient ready for DC to [facility] [place] [facility]. RN to call report prior to discharge [phone] room 106). RN, patient, patient's family, and facility notified of DC. Discharge Summary and FL2 sent to facility. DC packet on chart. Ambulance transport requested for patient.   CSW will sign off for now as social work intervention is no longer needed. Please consult [place] again if new needs arise.     Final next level of care: Skilled Nursing Facility Barriers to Discharge: Barriers Resolved   Patient Goals and CMS Choice Patient states their goals for this hospitalization and ongoing recovery are:: Rehab CMS Medicare.gov Compare Post Acute Care list provided to:: Patient Choice offered to / list presented to : Patient  Discharge Placement   Existing PASRR number confirmed : 07/01/21          Patient chooses bed at: [facility] Patient to be transferred to facility by: [facility]   Patient and family notified of of transfer: 07/01/21  Discharge Plan and Services In-house Referral: Clinical Social Work   Post Acute Care Choice: [place]                               Social Determinants of Health (SDOH) Interventions     Readmission Risk Interventions No flowsheet data found.

## 2021-07-02 NOTE — Progress Notes (Signed)
Progress Note   Patient: Gregory Kerr IWP:[id number] DOB: February 01, 1941 DOA: 06/28/2021     4 DOS: the patient was seen and examined on 07/02/2021   Brief hospital course: The patient is a 81 yr old man who presented to [facility] ED on 06/28/2021 with complaints of abdominal pain. 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 In the ED he was found to have a fever of 100.3, tachycardia, lactic acid of 4.5, tachypnea, and metabolic acidosis. This was felt to be due to UTI. Urinalysis is suggestive of UTI. Urine culture has been collected, but is pending. Blood cultures x 2 have been collected and have had no growth.   CT abdomen and pelvis has demonstrated obstipation with fecal impaction.   [facility] hospitalists were consulted to admit the patient for further evaluation and care.  The patient is receiving IV fluids and IV cefepime and vancomycin. This has been converted to oral cefdini.    The patient has had one enema without strong results the enema will be repeated.  Severe Sepsis resolved.  Patient denies bladder spasms.  Assessment and Plan: HTN (hypertension) Allow permissive hypertension  Sepsis ([place])  -SIRS criteria met with elevated white blood cell count,       Component Value Date/Time   WBC 11.1 (H) 06/28/2021 1403   LYMPHSABS 2.0 06/28/2021 1403   fever   RR >20 Today's Vitals   06/28/21 2030 06/28/21 2100 06/28/21 2115 06/28/21 2215  BP: (!) 113/53 (!) 151/78 119/64 (!) 108/55  Pulse: 73 90 77 66  Resp: (!) 22 (!) 22  (!) 8  Temp:      TempSrc:      SpO2: 99% 98% 96% 93%  Weight:      Height:      PainSc:          Vitals:   06/28/21 2030 06/28/21 2100 06/28/21 2115 06/28/21 2215  BP: (!) 113/53 (!) 151/78 119/64 (!) 108/55  Pulse: 73 90 77 66  Resp: (!) 22 (!) 22  (!) 8  Temp:      TempSrc:      SpO2: 99% 98% 96% 93%  Weight:      Height:        -Most likely source being: urinary,      Patient  meeting criteria for Severe sepsis with    evidence of end organ damage/organ dysfunction such as     elevated lactic acid >2     Component Value Date/Time   LATICACIDVEN 4.5 (HH) 06/28/2021 1403       - Obtain serial lactic acid and procalcitonin level.  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 Creatinine today is 0.79. Upon presentation creatinine was increased to 1.09. With IV fluids it has improved. Will continue to avoid nephrotoxic medications and hypotension. Monitor creatinine, electrolytes, and volume status.   Constipation Resolved. The patient has had 3 BM's in the last 24 hours.  CVA, old, hemiparesis ([place]) Noted. UTI is due to chronic suprapubic catheter. The suprapubic catheter was replaced in the ED. Urine culture is pending. The patient is receiving IV cefepime and vancomycin. MRSA screen has been ordered. Will stop vancomycin if it is negative. Continue lipitor and plavix. Lisinopril is held due to normal blood pressures. Continue to monitor.   GERD (gastroesophageal reflux disease) Continue PPI.  HTN (hypertension) The patient is  currently normotensive on no antihypertensives. Lisionpril held. Continue to monitor.  Prolonged QT interval Avoid medications that may further prolong QT. Monitor with EKG. Monitor electrolytes.  Sepsis ([place])  In the ED he was found to have a fever of 100.3, tachycardia, lactic acid of 4.5, tachypnea, and metabolic acidosis. This was felt to be due to UTI. Blood pressures have been low normal. IV fluids have been continued. It is resolved.  Weakness Noted. Will order PT/OT.  Bladder spasm Due to UTI. Patient states that it is no longer bothering him today.  Subjective: The patient is resting comfortably. No new complaints.   Physical Exam: Vitals:   07/01/21 2000 07/01/21 2330 07/02/21 0315 07/02/21 0751  BP: (!) 149/79 (!) 150/62 132/66 135/65  Pulse: 84 70 63 62  Resp: _0 Temp: 98.5 F (36.9 C) 98.4 F (36.9 C) 97.6 F (36.4 C) (!) 97.5 F (36.4 C)  TempSrc: Oral Oral Axillary Oral  SpO2: 92% 93% 91% 92%  Weight:      Height:       Exam:  Constitutional:  The patient is awake, alert, and oriented x 3. No acute distress. Respiratory:  No increased work of breathing. No wheezes, rales, or rhonchi No tactile fremitus Cardiovascular:  Regular rate and rhythm No murmurs, ectopy, or gallups. No lateral PMI. No thrills. Abdomen:  Abdomen is soft, non-tender, non-distended No hernias, masses, or organomegaly Normoactive bowel sounds.  Musculoskeletal:  No cyanosis, clubbing, or edema Skin:  No rashes, lesions, ulcers palpation of skin: no induration or nodules Neurologic:  CN 2-12 intact Left sided weakness. Psychiatric:  Mental status Mood, affect appropriate Orientation to person, place, time  judgment and insight appear intact   Data Reviewed:  Vitals  CBC, BMP  Culture results  EKG  CT Abdomen and Pelvis  Family Communication: None available  I have seen and examined this patient myself. I have spent 34 minutes in his evaluation  and care.  Disposition: Status is: Inpatient Remains inpatient appropriate because: IV antibiotics, fecal impaction with obstipation, generalized weakness.  PT/OT to eval and treat.   Planned Discharge Destination:  Likely to return to facility from which he presented.  DVT Prophylaxis:  SCD's CODE STATUS: Full Code Family Communication: None available Disposition:  Return to original facility.  Author: Jerrell Hart, DO 07/01/2021 11:55 AM  For on call review [email or web address].

## 2021-07-03 LAB — CULTURE, BLOOD (ROUTINE X 2)
Culture: NO GROWTH
Culture: NO GROWTH
Special Requests: ADEQUATE

## 2021-07-14 ENCOUNTER — Ambulatory Visit: Payer: Medicare Other | Admitting: Neurology

## 2021-08-01 ENCOUNTER — Other Ambulatory Visit: Payer: Self-pay

## 2021-08-01 ENCOUNTER — Encounter (HOSPITAL_COMMUNITY): Payer: Self-pay | Admitting: Emergency Medicine

## 2021-08-01 ENCOUNTER — Emergency Department (HOSPITAL_COMMUNITY)
Admission: EM | Admit: 2021-08-01 | Discharge: 2021-08-01 | Disposition: A | Discharge status: Skilled Nursing Facility | Payer: Medicare Other | Attending: Emergency Medicine | Admitting: Emergency Medicine

## 2021-08-01 DIAGNOSIS — I10 Essential (primary) hypertension: Secondary | ICD-10-CM | POA: Diagnosis not present

## 2021-08-01 DIAGNOSIS — X58XXXA Exposure to other specified factors, initial encounter: Secondary | ICD-10-CM | POA: Insufficient documentation

## 2021-08-01 DIAGNOSIS — T83010A Breakdown (mechanical) of cystostomy catheter, initial encounter: Secondary | ICD-10-CM

## 2021-08-01 DIAGNOSIS — Z79899 Other long term (current) drug therapy: Secondary | ICD-10-CM | POA: Diagnosis not present

## 2021-08-01 DIAGNOSIS — T83098A Other mechanical complication of other indwelling urethral catheter, initial encounter: Secondary | ICD-10-CM | POA: Insufficient documentation

## 2021-08-01 NOTE — ED Provider Notes (Signed)
?[facility] ?Provider Note ? ? ?CSN: [id number] ?Arrival date & time: 08/01/21  0200 ? ?  ? ?History ? ?Chief Complaint  ?Patient presents with  ? Dislodged Suprapubic Urinary Catheter  ? ? ?Gregory Kerr is a 81 y.o. male. ? ?The history is provided by the patient.  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 ?  ?Home Medications ?Prior to Admission medications   ?Medication Sig Start Date End Date Taking? Authorizing Provider  ?atorvastatin (LIPITOR) 40 MG tablet Take 1 tablet (40 mg total) by mouth daily. ?Patient taking differently: Take 40 mg by mouth every evening. 11/22/20   Madalyn Rob, MD  ?baclofen (LIORESAL) 20 MG tablet Take 20 mg by mouth 3 (three) times daily.    [provider]  ?bisacodyl (DULCOLAX) 10 MG suppository Place 10 mg rectally daily as needed for moderate constipation.    [provider]  ?calcium-vitamin D (OSCAL WITH D) 500-200 MG-UNIT tablet Take 1 tablet by mouth 2 (two) times daily.    [provider]  ?clopidogrel (PLAVIX) 75 MG tablet Take 1 tablet (75 mg total) by mouth daily. 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 For Pseudobulbar Affect (PBA)    [provider]  ?diclofenac Sodium (VOLTAREN) 1 % GEL Apply 2 g topically in the morning, at noon, and at bedtime. To right shoulder    [provider]  ?gabapentin (NEURONTIN) 100 MG capsule Take 100 mg by mouth 3 (three) times daily.    [provider]  ?guaiFENesin (MUCINEX) 600 MG 12 hr tablet Take 600 mg by mouth 2 (two) times daily.    [provider]  ?hydroxypropyl methylcellulose / hypromellose (ISOPTO TEARS / GONIOVISC) 2.5 % ophthalmic solution Place 1 drop into both eyes 3 (three) times daily.    [provider]  ?loperamide (IMODIUM A-D) 2 MG tablet Take 4 mg by mouth 4 (four) times daily as needed for diarrhea or loose stools.    [provider]  ?loratadine (CLARITIN) 10 MG tablet Take 10 mg by mouth daily.    [provider]  ?LORazepam (ATIVAN) 0.5 MG tablet Take 0.5 mg by mouth 2 (two) times daily as needed for anxiety.    [provider]  ?Miconazole-Zinc Oxide-Petrolat 0.25-15-81.35 % OINT Apply 1 application topically every 4 (four) hours. To penis and groin area    [provider]  ?mupirocin ointment (BACTROBAN) 2 % Apply 1 application topically 2 (two) times daily. Suprapubic site    [provider]  ?nitroGLYCERIN (NITROSTAT) 0.4 MG SL tablet Place 0.4 mg under the tongue every 5 (five) minutes as needed for chest pain.    [provider]  ?Nutritional Supplements (BOOST PO) Take 1 Container by mouth daily. Lactose-Reduced 0..04 g/1 kcal/ml liquid    [provider]  ?pantoprazole (PROTONIX) 20 MG tablet Take 20 mg by mouth daily. 11/17/20   [provider]  ?phenazopyridine (PYRIDIUM) 100 MG tablet Take 100 mg by mouth every 8 (eight) hours as needed (bladder spasms).    [provider]  ?polyethylene glycol (MIRALAX / GLYCOLAX) packet Take 17 g by mouth daily as needed (constipation).     [provider]  ?Prenatal Vit-Fe Fumarate-FA (PREPLUS) 27-1 MG TABS Take 1 tablet by mouth daily.     [provider]  ?  PROLIA 60 MG/ML SOSY injection Inject 60 mg into the skin every 6 (six) months. 09/07/20   [provider]  ?sertraline (ZOLOFT) 100 MG tablet Take 100 mg by mouth daily.    [provider]  ?solifenacin (VESICARE) 5 MG tablet Take 1 tablet (5 mg total) by mouth daily. 11/21/20   Madalyn Rob, MD  ?tamsulosin (FLOMAX) 0.4 MG CAPS capsule Take 1 capsule (0.4 mg total) by mouth at bedtime. ?Patient not taking: Reported on 06/28/2021 11/21/20   Madalyn Rob, MD  ?traMADol (ULTRAM) 50 MG tablet Take  50 mg by mouth See admin instructions. Take 50 mg twice daily scheduled, may take a 3rd 50 mg dose as needed for pain    [provider]  ?Vitamins A & D (VITAMIN A & D) ointment Apply 1 application topically in the morning, at noon, in the evening, and at bedtime. To abdominal folds, penis and scrotum along with nystatin    [provider]  ?   ? ?Allergies    ?Penicillins   ? ?Review of Systems   ?Review of Systems  ?All other systems reviewed and are negative. ? ?Physical Exam ?Updated Vital Signs ?BP 135/64   Pulse 77   Temp 97.7 ?F (36.5 ?C) (Temporal)   Resp 18   SpO2 96%  ?Physical Exam ?Vitals and nursing note reviewed.  ?81 year old male, resting comfortably and in no acute distress. Vital signs are normal. Oxygen saturation is 96%, which is normal. ?Head is normocephalic and atraumatic. PERRLA, EOMI. Oropharynx is clear. ?Neck is nontender. ?Back is nontender and there is no CVA tenderness. ?Lungs are clear without rales, wheezes, or rhonchi. ?Chest is nontender. ?Heart has regular rate and rhythm without murmur. ?Abdomen is soft, flat, nontender.  Suprapubic catheter site is identified with a small amount of blood present. ?Extremities have no cyanosis or edema,. ?Skin is warm and dry without rash. ?Neurologic: Awake and alert, spastic left hemiparesis present. ? ?ED Results / Procedures / Treatments   ? ? ?Procedures ?SUPRAPUBIC TUBE PLACEMENT ? ?Date/Time: 08/01/2021 6:56 AM ?Performed by: Delora Fuel, MD ?Authorized by: Delora Fuel, MD  ? ?Consent:  ?  Consent obtained:  Verbal ?  Consent given by:  Patient ?  Risks, benefits, and alternatives were discussed: yes   ?  Risks discussed:  Bleeding, infection and pain ?  Alternatives discussed:  No treatment ?Universal protocol:  ?  Procedure explained and questions answered to patient or proxy's satisfaction: yes   ?  Relevant documents present and verified: yes   ?  Site/side marked: yes   ?  Immediately prior to procedure, a time  out was called: yes   ?  Patient identity confirmed:  Verbally with patient and arm band ?Sedation:  ?  Sedation type:  None ?Anesthesia:  ?  Anesthesia method:  None ?Procedure details:  ?  Complexity:  Simple ?  Catheter type:  Foley ?  Catheter size:  16 Fr ?  Ultrasound guidance: no   ?  Number of attempts:  1 ?  Urine characteristics:  Blood-tinged ?Post-procedure details:  ?  Procedure completion:  Tolerated well, no immediate complications  ? ? ?Medications Ordered in ED ?Medications - No data to display ? ?ED Course/ Medical Decision Making/ A&P ?  ?                        ?Medical Decision Making ? ?Dislodged suprapubic catheter replaced in the emergency department.  Replacement catheter did require initial irrigation, and is now draining blood-tinged urine.  Old records are reviewed, and he does have prior ED visits for dislodged suprapubic catheter. ? ? ? ? ? ? ? ?Final Clinical Impression(s) / ED Diagnoses ?Final diagnoses:  ?Suprapubic catheter dysfunction, initial encounter ([place])  ? ? ?Rx / DC Orders ?ED Discharge Orders   ? ? None  ? ?  ? ? ?  ?Delora Fuel, MD ?25/42/70 [phone] ? ?

## 2021-08-01 NOTE — ED Notes (Signed)
Report given to [facility] on patient's discharge/return to SNF , [facility] notified by Network engineer for transport.  ?

## 2021-08-01 NOTE — ED Triage Notes (Signed)
Patient arrived with EMS from [facility] reports dislodged supra pubic urinary catheter this evening , no bleeding noted at insertion site .  ?

## 2021-08-01 NOTE — Discharge Instructions (Signed)
Continue routine suprapubic catheter care. ?

## 2021-08-01 NOTE — ED Notes (Signed)
Dr. Roxanne Mins (EDP) inserted EY.22 foley suprapubic catheter . ?

## 2021-08-07 ENCOUNTER — Emergency Department (HOSPITAL_COMMUNITY)
Admission: EM | Admit: 2021-08-07 | Discharge: 2021-08-07 | Disposition: A | Discharge status: Home / Self Care | Payer: Medicare Other | Attending: Emergency Medicine | Admitting: Emergency Medicine

## 2021-08-07 ENCOUNTER — Other Ambulatory Visit: Payer: Self-pay

## 2021-08-07 DIAGNOSIS — T83098A Other mechanical complication of other indwelling urethral catheter, initial encounter: Secondary | ICD-10-CM | POA: Insufficient documentation

## 2021-08-07 DIAGNOSIS — T83011A Breakdown (mechanical) of indwelling urethral catheter, initial encounter: Secondary | ICD-10-CM

## 2021-08-07 DIAGNOSIS — R300 Dysuria: Secondary | ICD-10-CM | POA: Insufficient documentation

## 2021-08-07 MED ORDER — OXYCODONE HCL 5 MG PO TABS
5.0000 mg | ORAL_TABLET | Freq: Once | ORAL | Status: AC
  Administered 2021-08-07: 5 mg via ORAL
  Filled 2021-08-07: qty 1

## 2021-08-07 MED ORDER — NYSTATIN 100000 UNIT/GM EX POWD
Freq: Once | CUTANEOUS | Status: AC
  Filled 2021-08-07: qty 15

## 2021-08-07 NOTE — ED Notes (Signed)
RN bladder scannd pt, 71m total volume. RN met resistance when attempting to flush suprapubic catheter. Trifan MD aware. ?

## 2021-08-07 NOTE — Discharge Instructions (Signed)
Please consider flushing the foley catheter with 50 cc's of sterile water or saline twice a day to help wash out the blood and crystals.   ? ?We sent a urine culture to the lab to ensure there is no infection.  Please follow up on the results in 2-4 days.   If he is growing an infection, our pharmacy team will likely contact you with antibiotic recommendations. ? ?Follow up with the urologists as well for this ongoing issue. ?

## 2021-08-07 NOTE — ED Notes (Signed)
E-signature pad unavailable at time of pt discharge. This RN discussed discharge materials with pt and answered all pt questions. Pt stated understanding of discharge material. ? ?

## 2021-08-07 NOTE — ED Notes (Signed)
Called [facility] to transport patient to [facility] ?

## 2021-08-07 NOTE — ED Triage Notes (Signed)
Pt here via [facility] from [facility] for displaced suprapubic catheter since this morning. Staff stated they have been giving him pyridium due to "crystals" in his urine, they gave it to him this am before realizing catheter was displaced. ?

## 2021-08-07 NOTE — ED Provider Notes (Signed)
?[facility] ?Provider Note ? ? ?CSN: [id number] ?Arrival date & time: 08/07/21  1518 ? ?  ? ?History ? ?Chief Complaint  ?Patient presents with  ? Suprapubic Catheter Displaced  ? ? ?IVAL PACER is a 81 y.o. male with a history of suprapubic catheter presenting from his nursing facility with concern for suprapubic catheter displacement.  Patient had his catheter replaced 5 days ago in the ER with a 16 [place] Foley. ? ?HPI ? ?  ? ?Home Medications ?Prior to Admission medications   ?Medication Sig Start Date End Date Taking? Authorizing Provider  ?atorvastatin (LIPITOR) 40 MG tablet Take 1 tablet (40 mg total) by mouth daily. ?Patient taking differently: Take 40 mg by mouth every evening. 11/22/20   Madalyn Rob, MD  ?baclofen (LIORESAL) 20 MG tablet Take 20 mg by mouth 3 (three) times daily.    [provider]  ?bisacodyl (DULCOLAX) 10 MG suppository Place 10 mg rectally daily as needed for moderate constipation.    [provider]  ?calcium-vitamin D (OSCAL WITH D) 500-200 MG-UNIT tablet Take 1 tablet by mouth 2 (two) times daily.    [provider]  ?clopidogrel (PLAVIX) 75 MG tablet Take 1 tablet (75 mg total) by mouth daily. 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 For Pseudobulbar Affect (PBA)    [provider]  ?diclofenac Sodium (VOLTAREN) 1 % GEL Apply 2 g topically in the morning, at noon, and at bedtime. To right shoulder    [provider]  ?gabapentin (NEURONTIN) 100 MG capsule Take 100 mg by mouth 3 (three) times daily.    [provider]  ?guaiFENesin (MUCINEX) 600 MG 12 hr tablet Take 600 mg by mouth 2 (two) times daily.    [provider]  ?hydroxypropyl methylcellulose / hypromellose (ISOPTO TEARS / GONIOVISC) 2.5 % ophthalmic solution Place 1 drop into both eyes 3 (three) times daily.    [provider]  ?loperamide  (IMODIUM A-D) 2 MG tablet Take 4 mg by mouth 4 (four) times daily as needed for diarrhea or loose stools.    [provider]  ?loratadine (CLARITIN) 10 MG tablet Take 10 mg by mouth daily.    [provider]  ?LORazepam (ATIVAN) 0.5 MG tablet Take 0.5 mg by mouth 2 (two) times daily as needed for anxiety.    [provider]  ?Miconazole-Zinc Oxide-Petrolat 0.25-15-81.35 % OINT Apply 1 application topically every 4 (four) hours. To penis and groin area    [provider]  ?mupirocin ointment (BACTROBAN) 2 % Apply 1 application topically 2 (two) times daily. Suprapubic site    [provider]  ?nitroGLYCERIN (NITROSTAT) 0.4 MG SL tablet Place 0.4 mg under the tongue every 5 (five) minutes as needed for chest pain.    [provider]  ?Nutritional Supplements (BOOST PO) Take 1 Container by mouth daily. Lactose-Reduced 0..04 g/1 kcal/ml liquid    [provider]  ?pantoprazole (PROTONIX) 20 MG tablet Take 20 mg by mouth daily. 11/17/20   [provider]  ?phenazopyridine (PYRIDIUM) 100 MG tablet Take 100 mg by mouth every 8 (eight) hours as needed (bladder spasms).    [provider]  ?polyethylene glycol (MIRALAX / GLYCOLAX) packet Take 17 g by mouth daily as needed (constipation).     [provider]  ?Prenatal Vit-Fe Fumarate-FA (PREPLUS) 27-1 MG TABS Take 1 tablet by mouth daily.  [provider]  ?PROLIA 60 MG/ML SOSY injection Inject 60 mg into the skin every 6 (six) months. 09/07/20   [provider]  ?sertraline (ZOLOFT) 100 MG tablet Take 100 mg by mouth daily.    [provider]  ?solifenacin (VESICARE) 5 MG tablet Take 1 tablet (5 mg total) by mouth daily. 11/21/20   Madalyn Rob, MD  ?tamsulosin (FLOMAX) 0.4 MG CAPS capsule Take 1 capsule (0.4 mg total) by mouth at bedtime. ?Patient not taking: Reported on 06/28/2021 11/21/20   Madalyn Rob, MD  ?traMADol (ULTRAM) 50 MG tablet Take 50 mg by mouth  See admin instructions. Take 50 mg twice daily scheduled, may take a 3rd 50 mg dose as needed for pain    [provider]  ?Vitamins A & D (VITAMIN A & D) ointment Apply 1 application topically in the morning, at noon, in the evening, and at bedtime. To abdominal folds, penis and scrotum along with nystatin    [provider]  ?   ? ?Allergies    ?Penicillins   ? ?Review of Systems   ?Review of Systems ? ?Physical Exam ?Updated Vital Signs ?BP (!) 161/80   Pulse 89   Temp 98.1 ?F (36.7 ?C) (Oral)   Resp 14   SpO2 93%  ?Physical Exam ?Constitutional:   ?   General: He is not in acute distress. ?HENT:  ?   Head: Normocephalic and atraumatic.  ?Eyes:  ?   Conjunctiva/sclera: Conjunctivae normal.  ?   Pupils: Pupils are equal, round, and reactive to light.  ?Cardiovascular:  ?   Rate and Rhythm: Normal rate and regular rhythm.  ?Pulmonary:  ?   Effort: Pulmonary effort is normal. No respiratory distress.  ?Abdominal:  ?   General: There is no distension.  ?   Tenderness: There is no abdominal tenderness.  ?Skin: ?   General: Skin is warm and dry.  ?   Comments: Yeast infection of the inguinal region and suprapubic region, catheter appears to be in the correct suprapubic location and is easily passing back-and-forth, no active leakage  ?Neurological:  ?   General: No focal deficit present.  ?   Mental Status: He is alert. Mental status is at baseline.  ? ? ?ED Results / Procedures / Treatments   ?Labs ?(all labs ordered are listed, but only abnormal results are displayed) ?Labs Reviewed  ?URINE CULTURE  ? ? ?EKG ?None ? ?Radiology ?No results found. ? ?Procedures ?SUPRAPUBIC TUBE PLACEMENT ? ?Date/Time: 08/07/2021 9:52 PM ?Performed by: Wyvonnia Dusky, MD ?Authorized by: Wyvonnia Dusky, MD  ? ?Consent:  ?  Consent obtained:  Verbal ?  Consent given by:  Patient ?  Risks, benefits, and alternatives were discussed: yes   ?  Risks discussed:  Bleeding and infection ?Universal protocol:  ?   Procedure explained and questions answered to patient or proxy's satisfaction: yes   ?  Immediately prior to procedure, a time out was called: yes   ?  Patient identity confirmed:  Arm band ?Sedation:  ?  Sedation type:  None ?Anesthesia:  ?  Anesthesia method:  Topical application ?  Topical anesthetic:  Lidocaine gel ?Procedure details:  ?  Complexity:  Simple ?  Catheter type:  Foley ?  Catheter size:  16 Fr ?  Ultrasound guidance: no   ?  Number of attempts:  1 ?  Urine characteristics:  Blood-tinged ?Post-procedure details:  ?  Procedure completion:  Tolerated well, no immediate complications  ? ? ?  Medications Ordered in ED ?Medications  ?nystatin (MYCOSTATIN/NYSTOP) topical powder ( Topical Given 08/07/21 1911)  ?oxyCODONE (Oxy IR/ROXICODONE) immediate release tablet 5 mg (5 mg Oral Given 08/07/21 1836)  ? ? ?ED Course/ Medical Decision Making/ A&P ?Clinical Course as of 08/07/21 2151  ?Sun Aug 07, 2021  ?1826 Foley catheter exchanged with another 79 [place].  After removal of the old Foley catheter insertion of new, there was immediate drainage of approximately 150 to 200 cc of bloody, pink-tinged urine. [MT]  ?  ?Clinical Course User Index ?[MT] Wyvonnia Dusky, MD  ? ?                        ?Medical Decision Making ?Amount and/or Complexity of Data Reviewed ?Labs: ordered. ? ?Risk ?Prescription drug management. ? ? ?Suprapubic catheter malfunction ?Catheter does appear to be draining urine in ED but was leaking around site and causing discomfort ?Catheter removed and new 16French inserted ?Nystatin ordered for fungal rash ?Urine culture will be sent - last urine cx had mixed flora, would await results.  Urine overall does not appear cloudy, but was blood-tinged, likely related to trauma of insertion ?Patient is needing urology f/u.  Also advised nursing facility to flush catheter with clean saline or water BID to prevent clots ? ? ? ? ? ? ? ?Final Clinical Impression(s) / ED Diagnoses ?Final diagnoses:   ?Malfunction of Foley catheter, initial encounter ([place])  ? ? ?Rx / DC Orders ?ED Discharge Orders   ? ? None  ? ?  ? ? ?  ?Wyvonnia Dusky, MD ?08/07/21 2154 ? ?

## 2021-08-10 ENCOUNTER — Other Ambulatory Visit: Payer: Self-pay

## 2021-08-10 ENCOUNTER — Emergency Department (HOSPITAL_COMMUNITY)
Admission: EM | Admit: 2021-08-10 | Discharge: 2021-08-11 | Disposition: A | Discharge status: Home / Self Care | Payer: Medicare Other | Attending: Emergency Medicine | Admitting: Emergency Medicine

## 2021-08-10 ENCOUNTER — Encounter (HOSPITAL_COMMUNITY): Payer: Self-pay

## 2021-08-10 ENCOUNTER — Emergency Department (HOSPITAL_COMMUNITY): Payer: Medicare Other

## 2021-08-10 DIAGNOSIS — Z85828 Personal history of other malignant neoplasm of skin: Secondary | ICD-10-CM | POA: Diagnosis not present

## 2021-08-10 DIAGNOSIS — R1030 Lower abdominal pain, unspecified: Secondary | ICD-10-CM | POA: Diagnosis present

## 2021-08-10 DIAGNOSIS — N390 Urinary tract infection, site not specified: Secondary | ICD-10-CM | POA: Insufficient documentation

## 2021-08-10 LAB — CBC WITH DIFFERENTIAL/PLATELET
Abs Immature Granulocytes: 0.02 10*3/uL (ref 0.00–0.07)
Basophils Absolute: 0.1 10*3/uL (ref 0.0–0.1)
Basophils Relative: 1 %
Eosinophils Absolute: 0.1 10*3/uL (ref 0.0–0.5)
Eosinophils Relative: 2 %
HCT: 43.8 % (ref 39.0–52.0)
Hemoglobin: 14.2 g/dL (ref 13.0–17.0)
Immature Granulocytes: 0 %
Lymphocytes Relative: 19 %
Lymphs Abs: 1.4 10*3/uL (ref 0.7–4.0)
MCH: 28.7 pg (ref 26.0–34.0)
MCHC: 32.4 g/dL (ref 30.0–36.0)
MCV: 88.7 fL (ref 80.0–100.0)
Monocytes Absolute: 0.6 10*3/uL (ref 0.1–1.0)
Monocytes Relative: 8 %
Neutro Abs: 5.1 10*3/uL (ref 1.7–7.7)
Neutrophils Relative %: 70 %
Platelets: 192 10*3/uL (ref 150–400)
RBC: 4.94 MIL/uL (ref 4.22–5.81)
RDW: 14 % (ref 11.5–15.5)
WBC: 7.3 10*3/uL (ref 4.0–10.5)
nRBC: 0 % (ref 0.0–0.2)

## 2021-08-10 LAB — COMPREHENSIVE METABOLIC PANEL
ALT: 74 U/L — ABNORMAL HIGH (ref 0–44)
AST: 57 U/L — ABNORMAL HIGH (ref 15–41)
Albumin: 2.9 g/dL — ABNORMAL LOW (ref 3.5–5.0)
Alkaline Phosphatase: 103 U/L (ref 38–126)
Anion gap: 6 (ref 5–15)
BUN: 17 mg/dL (ref 8–23)
CO2: 24 mmol/L (ref 22–32)
Calcium: 9.1 mg/dL (ref 8.9–10.3)
Chloride: 112 mmol/L — ABNORMAL HIGH (ref 98–111)
Creatinine, Ser: 0.8 mg/dL (ref 0.61–1.24)
GFR, Estimated: >60 mL/min (ref >60)
Glucose, Bld: 103 mg/dL — ABNORMAL HIGH (ref 70–99)
Potassium: 4.1 mmol/L (ref 3.5–5.1)
Sodium: 142 mmol/L (ref 135–145)
Total Bilirubin: 0.5 mg/dL (ref 0.3–1.2)
Total Protein: 6.7 g/dL (ref 6.5–8.1)

## 2021-08-10 LAB — URINALYSIS, ROUTINE W REFLEX MICROSCOPIC
Glucose, UA: NEGATIVE mg/dL
Ketones, ur: NEGATIVE mg/dL
Nitrite: POSITIVE — AB
Protein, ur: >=300 mg/dL — AB
Specific Gravity, Urine: 1.015 (ref 1.005–1.030)
WBC, UA: >50 WBC/hpf — ABNORMAL HIGH (ref 0–5)
pH: 8 (ref 5.0–8.0)

## 2021-08-10 LAB — LIPASE, BLOOD: Lipase: 27 U/L (ref 11–51)

## 2021-08-10 MED ORDER — IOHEXOL 300 MG/ML  SOLN
100.0000 mL | Freq: Once | INTRAMUSCULAR | Status: AC | PRN
  Administered 2021-08-10: 100 mL via INTRAVENOUS

## 2021-08-10 MED ORDER — FENTANYL CITRATE PF 50 MCG/ML IJ SOSY
50.0000 ug | PREFILLED_SYRINGE | Freq: Once | INTRAMUSCULAR | Status: AC
  Administered 2021-08-10: 50 ug via INTRAVENOUS
  Filled 2021-08-10: qty 1

## 2021-08-10 MED ORDER — FENTANYL CITRATE PF 50 MCG/ML IJ SOSY
50.0000 ug | PREFILLED_SYRINGE | Freq: Once | INTRAMUSCULAR | Status: AC
Start: 2021-08-10 — End: 2021-08-10
  Administered 2021-08-10: 50 ug via INTRAVENOUS
  Filled 2021-08-10: qty 1

## 2021-08-10 MED ORDER — FOSFOMYCIN TROMETHAMINE 3 G PO PACK
3.0000 g | PACK | Freq: Once | ORAL | Status: AC
  Administered 2021-08-10: 3 g via ORAL
  Filled 2021-08-10: qty 3

## 2021-08-10 MED ORDER — CEPHALEXIN 500 MG PO CAPS: 500.0000 mg | ORAL_CAPSULE | Freq: Four times a day (QID) | ORAL | 0 refills | Status: AC

## 2021-08-10 MED ORDER — CEFTRIAXONE SODIUM 1 G IJ SOLR
1.0000 g | Freq: Once | INTRAMUSCULAR | Status: AC
  Administered 2021-08-10: 1 g via INTRAVENOUS
  Filled 2021-08-10: qty 10

## 2021-08-10 NOTE — Discharge Instructions (Signed)
Advise follow-up with urology and your primary care doctor.  Take antibiotic as prescribed for your suspected urinary tract infection.  If you have worsening pain, fever, vomiting or other new concerning symptom, come back to ER for reassessment. ?

## 2021-08-10 NOTE — ED Triage Notes (Signed)
Pt having bladder spasms for a week. BIB EMS for continued bladder spasms. ?

## 2021-08-10 NOTE — ED Provider Notes (Signed)
[facility] EMERGENCY DEPARTMENT Provider Note   CSN: [id number] Arrival date & time: 08/10/21  1924     History  Chief Complaint  Patient presents with   Spasms    Pt having bladder spasms since yesterday     Gregory Kerr is a 81 y.o. male.  Presenting to the emergency department with concern for bladder spasm.  Patient reports that he is having increasing lower abdominal pain, feels like his bladder is spasming at times.  Some nausea but no vomiting.  No fever.  Patient believes his catheter is still functioning.  Reviewed EMS report.  Bladder spasms for 1 week.  Reviewed chart, extensive medical history including history of stroke, spastic hemiparesis, basal cell carcinoma, s/p suprapubic catheter  Last ER visit for Foley catheter malfunction, replaced.  Most recent admission for sepsis from suspected UTI.  HPI     Home Medications Prior to Admission medications   Medication Sig Start Date End Date Taking? Authorizing Provider  cephALEXin (KEFLEX) 500 MG capsule Take 1 capsule (500 mg total) by mouth 4 (four) times daily for 7 days. 08/10/21 08/17/21 Yes Macaria Bias, Quitman Livings, MD  atorvastatin (LIPITOR) 40 MG tablet Take 1 tablet (40 mg total) by mouth daily. Patient taking differently: Take 40 mg by mouth every evening. 11/22/20   Albertha Ghee, MD  baclofen (LIORESAL) 20 MG tablet Take 20 mg by mouth 3 (three) times daily.    [provider]  bisacodyl (DULCOLAX) 10 MG suppository Place 10 mg rectally daily as needed for moderate constipation.    [provider]  calcium-vitamin D (OSCAL WITH D) 500-200 MG-UNIT tablet Take 1 tablet by mouth 2 (two) times daily.    [provider]  clopidogrel (PLAVIX) 75 MG tablet Take 1 tablet (75 mg total) by mouth daily. 11/22/20   Albertha Ghee, MD  Dextromethorphan-quiNIDine (NUEDEXTA) 20-10 MG capsule Take 1 capsule by mouth daily. For Pseudobulbar Affect (PBA)    [provider]   diclofenac Sodium (VOLTAREN) 1 % GEL Apply 2 g topically in the morning, at noon, and at bedtime. To right shoulder    [provider]  gabapentin (NEURONTIN) 100 MG capsule Take 100 mg by mouth 3 (three) times daily.    [provider]  guaiFENesin (MUCINEX) 600 MG 12 hr tablet Take 600 mg by mouth 2 (two) times daily.    [provider]  hydroxypropyl methylcellulose / hypromellose (ISOPTO TEARS / GONIOVISC) 2.5 % ophthalmic solution Place 1 drop into both eyes 3 (three) times daily.    [provider]  loperamide (IMODIUM A-D) 2 MG tablet Take 4 mg by mouth 4 (four) times daily as needed for diarrhea or loose stools.    [provider]  loratadine (CLARITIN) 10 MG tablet Take 10 mg by mouth daily.    [provider]  LORazepam (ATIVAN) 0.5 MG tablet Take 0.5 mg by mouth 2 (two) times daily as needed for anxiety.    [provider]  Miconazole-Zinc Oxide-Petrolat 0.25-15-81.35 % OINT Apply 1 application topically every 4 (four) hours. To penis and groin area    [provider]  mupirocin ointment (BACTROBAN) 2 % Apply 1 application topically 2 (two) times daily. Suprapubic site    [provider]  nitroGLYCERIN (NITROSTAT) 0.4 MG SL tablet Place 0.4 mg under the tongue every 5 (five) minutes as needed for chest pain.    [provider]  Nutritional Supplements (BOOST PO) Take 1 Container by mouth daily.  Lactose-Reduced 0..04 g/1 kcal/ml liquid    [provider]  pantoprazole (PROTONIX) 20 MG tablet Take 20 mg by mouth daily. 11/17/20   [provider]  phenazopyridine (PYRIDIUM) 100 MG tablet Take 100 mg by mouth every 8 (eight) hours as needed (bladder spasms).    [provider]  polyethylene glycol (MIRALAX / GLYCOLAX) packet Take 17 g by mouth daily as needed (constipation).     [provider]  Prenatal Vit-Fe Fumarate-FA (PREPLUS) 27-1 MG TABS Take 1 tablet by mouth  daily.     [provider]  PROLIA 60 MG/ML SOSY injection Inject 60 mg into the skin every 6 (six) months. 09/07/20   [provider]  sertraline (ZOLOFT) 100 MG tablet Take 100 mg by mouth daily.    [provider]  solifenacin (VESICARE) 5 MG tablet Take 1 tablet (5 mg total) by mouth daily. 11/21/20   Albertha Ghee, MD  tamsulosin (FLOMAX) 0.4 MG CAPS capsule Take 1 capsule (0.4 mg total) by mouth at bedtime. Patient not taking: Reported on 06/28/2021 11/21/20   Albertha Ghee, MD  traMADol (ULTRAM) 50 MG tablet Take 50 mg by mouth See admin instructions. Take 50 mg twice daily scheduled, may take a 3rd 50 mg dose as needed for pain    [provider]  Vitamins A & D (VITAMIN A & D) ointment Apply 1 application topically in the morning, at noon, in the evening, and at bedtime. To abdominal folds, penis and scrotum along with nystatin    [provider]      Allergies    Penicillins    Review of Systems   Review of Systems  Constitutional:  Negative for chills and fever.  HENT:  Negative for ear pain and sore throat.   Eyes:  Negative for pain and visual disturbance.  Respiratory:  Negative for cough and shortness of breath.   Cardiovascular:  Negative for chest pain and palpitations.  Gastrointestinal:  Positive for abdominal pain and nausea. Negative for vomiting.  Genitourinary:  Positive for urgency. Negative for dysuria and hematuria.  Musculoskeletal:  Negative for arthralgias and back pain.  Skin:  Negative for color change and rash.  Neurological:  Negative for seizures and syncope.  All other systems reviewed and are negative.  Physical Exam Updated Vital Signs BP (!) 149/67   Pulse 83   Temp 98 F (36.7 C) (Oral)   SpO2 97%  Physical Exam Vitals and nursing note reviewed.  Constitutional:      General: He is not in acute distress.    Appearance: He is well-developed.     Comments: Chronically ill-appearing but no distress  HENT:      Head: Normocephalic and atraumatic.  Eyes:     Conjunctiva/sclera: Conjunctivae normal.  Cardiovascular:     Rate and Rhythm: Normal rate and regular rhythm.     Heart sounds: No murmur heard. Pulmonary:     Effort: Pulmonary effort is normal. No respiratory distress.     Breath sounds: Normal breath sounds.  Abdominal:     Palpations: Abdomen is soft.     Tenderness: There is abdominal tenderness.     Comments: Some tenderness in right lower quadrant and left lower quadrant, suprapubic catheter is intact, appears to be functioning properly  Musculoskeletal:        General: No swelling.     Cervical back: Neck supple.  Skin:    General: Skin is warm and dry.     Capillary  Refill: Capillary refill takes less than 2 seconds.  Neurological:     Mental Status: He is alert.     Comments: At baseline  Psychiatric:        Mood and Affect: Mood normal.    ED Results / Procedures / Treatments   Labs (all labs ordered are listed, but only abnormal results are displayed) Labs Reviewed  URINALYSIS, ROUTINE W REFLEX MICROSCOPIC - Abnormal; Notable for the following components:      Result Value   APPearance TURBID (*)    Hgb urine dipstick SMALL (*)    Bilirubin Urine SMALL (*)    Protein, ur >=300 (*)    Nitrite POSITIVE (*)    Leukocytes,Ua MODERATE (*)    WBC, UA >50 (*)    Bacteria, UA MANY (*)    All other components within normal limits  COMPREHENSIVE METABOLIC PANEL - Abnormal; Notable for the following components:   Chloride 112 (*)    Glucose, Bld 103 (*)    Albumin 2.9 (*)    AST 57 (*)    ALT 74 (*)    All other components within normal limits  URINE CULTURE  CBC WITH DIFFERENTIAL/PLATELET  LIPASE, BLOOD    EKG None  Radiology CT ABDOMEN PELVIS W CONTRAST  Result Date: 08/10/2021 CLINICAL DATA:  Left lower quadrant abdominal pain EXAM: CT ABDOMEN AND PELVIS WITH CONTRAST TECHNIQUE: Multidetector CT imaging of the abdomen and pelvis was performed using the  standard protocol following bolus administration of intravenous contrast. RADIATION DOSE REDUCTION: This exam was performed according to the departmental dose-optimization program which includes automated exposure control, adjustment of the mA and/or kV according to patient size and/or use of iterative reconstruction technique. CONTRAST:  OMNIPAQUE IOHEXOL 300 MG/ML  SOLN COMPARISON:  06/28/2021 FINDINGS: Lower chest: Lung bases are clear. Coronary atherosclerosis of the LAD and right coronary artery. Hepatobiliary: Liver is within normal limits. Status post cholecystectomy. No intrahepatic or extrahepatic ductal dilatation. Pancreas: Within normal limits. Spleen: Within normal limits. Adrenals/Urinary Tract: Adrenal glands are within normal limits. Subcentimeter bilateral renal cysts. 3 mm nonobstructing right lower pole renal calculus (series 3/image 37). No hydronephrosis. Thick-walled bladder with layering 10 mm calculus (series 3/image 78) and indwelling Foley catheter. Stomach/Bowel: Large hiatal hernia. No evidence of bowel obstruction. Status post appendectomy. Mild to moderate left colonic stool burden, suggesting mild constipation. Mild rectal impaction (series 3/image 36). Vascular/Lymphatic: No evidence of abdominal aortic aneurysm. Atherosclerotic calcifications of the abdominal aorta and branch vessels. No suspicious abdominopelvic lymphadenopathy. Reproductive: Prostate is notable for dystrophic calcifications. Other: No abdominopelvic ascites. Small fat containing left inguinal hernia. Small to moderate right inguinal hernia containing loops of nondilated small bowel (series 3/image 89). Musculoskeletal: Mild degenerative changes of the lumbar spine. IMPRESSION: Mild constipation with mild rectal impaction. 3 mm nonobstructing right lower pole renal calculus. Layering bladder calculi. Indwelling Foley catheter. Small moderate right inguinal hernia containing nondilated loops of small bowel. No  evidence of bowel obstruction. Large hiatal hernia. Electronically Signed   By: Charline Bills M.D.   On: 08/10/2021 21:43    Procedures Procedures    Medications Ordered in ED Medications  fentaNYL (SUBLIMAZE) injection 50 mcg (has no administration in time range)  fosfomycin (MONUROL) packet 3 g (has no administration in time range)  fentaNYL (SUBLIMAZE) injection 50 mcg (50 mcg Intravenous Given 08/10/21 2207)  cefTRIAXone (ROCEPHIN) 1 g in sodium chloride 0.9 % 100 mL IVPB (1 g Intravenous New Bag/Given 08/10/21 2207)  iohexol (OMNIPAQUE) 300  MG/ML solution 100 mL (100 mLs Intravenous Contrast Given 08/10/21 2132)    ED Course/ Medical Decision Making/ A&P                           Medical Decision Making Amount and/or Complexity of Data Reviewed Labs: ordered. Radiology: ordered.  Risk Prescription drug management.   81 year old gentleman with extensive past medical history including history of CVA, spastic hemiparesis, from nursing facility presenting for lower abdominal pain, bladder spasms.  Recent ER visit for Foley catheter malfunction, suprapubic catheter was replaced and urine culture was sent.  Culture grew Proteus mirabilis, Enterococcus faecalis, susceptible to cephalosporins, resistant to Macrobid, ciprofloxacin.  Urine today still appears infected, remainder of labs grossly stable.  No fever, no evidence for sepsis at present.  CT scan negative for acute pathology.  Demonstrated nonobstructing right lower pole renal calculus and small moderate right inguinal hernia.  Findings similar on recent CT in February.  Suspect patient's symptoms are from UTI.  Provided dose of Rocephin in ER.  Will give Rx for cephalexin.  I discussed the ED faecalis with Christiane Ha with pharmacy, states the preference would be Augmentin or ampicillin but due to penicillin allergy would recommend giving dose of fosfomycin and still do the course of cephalexin.    Reviewed return precautions,  advised follow-up with PCP, urology, discharged back to facility.       Final Clinical Impression(s) / ED Diagnoses Final diagnoses:  Urinary tract infection without hematuria, site unspecified    Rx / DC Orders ED Discharge Orders          Ordered    cephALEXin (KEFLEX) 500 MG capsule  4 times daily        08/10/21 2306              Milagros Loll, MD 08/10/21 2337

## 2021-08-11 NOTE — ED Provider Notes (Signed)
Alerted by nursing staff that pain is increased and a large amount of urine is leaking around the catheter. ? ?Physical Exam  ?BP (!) 167/85   Pulse 83   Temp 98.2 ?F (36.8 ?C) (Oral)   Resp 18   SpO2 94%  ? ?Physical Exam ?Abdominal:  ?   Tenderness: There is abdominal tenderness.  ?Skin: ?   General: Skin is warm and dry.  ?   Findings: No erythema.  ? ? ?Procedures  ?SUPRAPUBIC TUBE PLACEMENT ? ?Date/Time: 08/11/2021 6:08 AM ?Performed by: Orpah Greek, MD ?Authorized by: Orpah Greek, MD  ? ?Consent:  ?  Consent obtained:  Verbal ?  Consent given by:  Patient ?  Risks, benefits, and alternatives were discussed: yes   ?  Risks discussed:  Bleeding and pain ?Universal protocol:  ?  Procedure explained and questions answered to patient or proxy's satisfaction: yes   ?  Site/side marked: yes   ?  Immediately prior to procedure, a time out was called: yes   ?  Patient identity confirmed:  Verbally with patient ?Sedation:  ?  Sedation type:  None ?Anesthesia:  ?  Anesthesia method:  Topical application ?Procedure details:  ?  Complexity:  Simple ?  Catheter type:  Foley ?  Catheter size:  16 Fr ?  Ultrasound guidance: no   ?  Number of attempts:  1 ?  Urine characteristics:  Cloudy ?Post-procedure details:  ?  Procedure completion:  Tolerated well, no immediate complications ? ?ED Course / MDM  ?  ?Medical Decision Making ?Amount and/or Complexity of Data Reviewed ?Labs: ordered. ?Radiology: ordered. ? ?Risk ?Prescription drug management. ? ? ?Foley appears to be blocked, patient with increased bladder spasms. Catheter changed by myself with improved symptoms. ? ? ? ? ?  ?Orpah Greek, MD ?08/11/21 [phone] ? ?

## 2021-08-11 NOTE — ED Notes (Signed)
[facility] called, no ETA given ?

## 2021-08-11 NOTE — ED Notes (Signed)
This RN went to assess pt needs while he was awake. This RN found pt soaked in urine although catheter appeared in place - MD Pollina notified and catheter to be changed  ?

## 2021-08-11 NOTE — ED Notes (Signed)
[facility] at bedside for transport.  

## 2021-08-12 LAB — URINE CULTURE: Culture: >=100000 — AB

## 2021-08-13 ENCOUNTER — Telehealth (HOSPITAL_BASED_OUTPATIENT_CLINIC_OR_DEPARTMENT_OTHER): Payer: Self-pay | Admitting: *Deleted

## 2021-08-13 NOTE — Telephone Encounter (Signed)
Post ED Visit - Positive Culture Follow-up ? ?Culture report reviewed by antimicrobial stewardship pharmacist: ?[place] Team ?'[]'$  Elenor Quinones, Pharm.D. ?'[]'$  Heide Guile, Pharm.D., BCPS AQ-ID ?'[]'$  Parks Neptune, Pharm.D., BCPS ?'[]'$  Alycia Rossetti, Pharm.D., BCPS ?'[]'$  [place], Pharm.D., BCPS, AAHIVP ?'[]'$  Legrand Como, Pharm.D., BCPS, AAHIVP ?'[]'$  Salome Arnt, PharmD, BCPS ?'[]'$  Johnnette Gourd, PharmD, BCPS ?'[]'$  Hughes Better, PharmD, BCPS ?'[]'$  Leeroy Cha, PharmD ?'[]'$  Laqueta Linden, PharmD, BCPS ?'[x]'$  Lorelei Pont, PharmD ? ?[place] Team ?'[]'$  Leodis Sias, PharmD ?'[]'$  Lindell Spar, PharmD ?'[]'$  Royetta Asal, PharmD ?'[]'$  Graylin Shiver, Rph ?'[]'$  Rema Fendt) Glennon Mac, PharmD ?'[]'$  Arlyn Dunning, PharmD ?'[]'$  Netta Cedars, PharmD ?'[]'$  Dia Sitter, PharmD ?'[]'$  Leone Haven, PharmD ?'[]'$  Gretta Arab, PharmD ?'[]'$  Theodis Shove, PharmD ?'[]'$  Peggyann Juba, PharmD ?'[]'$  Reuel Boom, PharmD ? ? ?Positive urine culture ?Treated with Fosfomycin and cephalexin, organism sensitive to the same and no further patient follow-up is required at this time. ? ?Gregory Kerr ?08/13/2021, 3:22 PM ?  ?

## 2021-08-31 ENCOUNTER — Other Ambulatory Visit: Payer: Self-pay

## 2021-08-31 ENCOUNTER — Encounter (HOSPITAL_COMMUNITY): Payer: Self-pay

## 2021-08-31 ENCOUNTER — Emergency Department (HOSPITAL_COMMUNITY): Payer: Medicare Other

## 2021-08-31 ENCOUNTER — Emergency Department (HOSPITAL_COMMUNITY)
Admission: EM | Admit: 2021-08-31 | Discharge: 2021-09-01 | Disposition: A | Discharge status: Home / Self Care | Payer: Medicare Other | Attending: Emergency Medicine | Admitting: Emergency Medicine

## 2021-08-31 DIAGNOSIS — K449 Diaphragmatic hernia without obstruction or gangrene: Secondary | ICD-10-CM | POA: Diagnosis not present

## 2021-08-31 DIAGNOSIS — Z79899 Other long term (current) drug therapy: Secondary | ICD-10-CM | POA: Diagnosis not present

## 2021-08-31 DIAGNOSIS — K4020 Bilateral inguinal hernia, without obstruction or gangrene, not specified as recurrent: Secondary | ICD-10-CM | POA: Diagnosis not present

## 2021-08-31 DIAGNOSIS — D72829 Elevated white blood cell count, unspecified: Secondary | ICD-10-CM | POA: Insufficient documentation

## 2021-08-31 DIAGNOSIS — I110 Hypertensive heart disease with heart failure: Secondary | ICD-10-CM | POA: Diagnosis not present

## 2021-08-31 DIAGNOSIS — T83010A Breakdown (mechanical) of cystostomy catheter, initial encounter: Secondary | ICD-10-CM

## 2021-08-31 DIAGNOSIS — N390 Urinary tract infection, site not specified: Secondary | ICD-10-CM

## 2021-08-31 DIAGNOSIS — I509 Heart failure, unspecified: Secondary | ICD-10-CM | POA: Diagnosis not present

## 2021-08-31 DIAGNOSIS — R339 Retention of urine, unspecified: Secondary | ICD-10-CM | POA: Diagnosis present

## 2021-08-31 DIAGNOSIS — Z85828 Personal history of other malignant neoplasm of skin: Secondary | ICD-10-CM | POA: Insufficient documentation

## 2021-08-31 DIAGNOSIS — N210 Calculus in bladder: Secondary | ICD-10-CM | POA: Diagnosis not present

## 2021-08-31 LAB — CBC WITH DIFFERENTIAL/PLATELET
Abs Immature Granulocytes: 0.02 10*3/uL (ref 0.00–0.07)
Basophils Absolute: 0.1 10*3/uL (ref 0.0–0.1)
Basophils Relative: 1 %
Eosinophils Absolute: 0.2 10*3/uL (ref 0.0–0.5)
Eosinophils Relative: 2 %
HCT: 41.3 % (ref 39.0–52.0)
Hemoglobin: 13.6 g/dL (ref 13.0–17.0)
Immature Granulocytes: 0 %
Lymphocytes Relative: 18 %
Lymphs Abs: 1.5 10*3/uL (ref 0.7–4.0)
MCH: 29.1 pg (ref 26.0–34.0)
MCHC: 32.9 g/dL (ref 30.0–36.0)
MCV: 88.2 fL (ref 80.0–100.0)
Monocytes Absolute: 0.5 10*3/uL (ref 0.1–1.0)
Monocytes Relative: 7 %
Neutro Abs: 6 10*3/uL (ref 1.7–7.7)
Neutrophils Relative %: 72 %
Platelets: 167 10*3/uL (ref 150–400)
RBC: 4.68 MIL/uL (ref 4.22–5.81)
RDW: 13.4 % (ref 11.5–15.5)
WBC: 8.2 10*3/uL (ref 4.0–10.5)
nRBC: 0 % (ref 0.0–0.2)

## 2021-08-31 LAB — BASIC METABOLIC PANEL
Anion gap: 5 (ref 5–15)
BUN: 19 mg/dL (ref 8–23)
CO2: 25 mmol/L (ref 22–32)
Calcium: 8.8 mg/dL — ABNORMAL LOW (ref 8.9–10.3)
Chloride: 110 mmol/L (ref 98–111)
Creatinine, Ser: 0.77 mg/dL (ref 0.61–1.24)
GFR, Estimated: >60 mL/min (ref >60)
Glucose, Bld: 102 mg/dL — ABNORMAL HIGH (ref 70–99)
Potassium: 3.9 mmol/L (ref 3.5–5.1)
Sodium: 140 mmol/L (ref 135–145)

## 2021-08-31 MED ORDER — IOHEXOL 300 MG/ML  SOLN
100.0000 mL | Freq: Once | INTRAMUSCULAR | Status: AC | PRN
  Administered 2021-08-31: 100 mL via INTRAVENOUS

## 2021-08-31 MED ORDER — FENTANYL CITRATE PF 50 MCG/ML IJ SOSY
25.0000 ug | PREFILLED_SYRINGE | Freq: Once | INTRAMUSCULAR | Status: AC
  Administered 2021-08-31: 25 ug via INTRAVENOUS
  Filled 2021-08-31: qty 1

## 2021-08-31 MED ORDER — NITROFURANTOIN MONOHYD MACRO 100 MG PO CAPS: 100.0000 mg | ORAL_CAPSULE | Freq: Two times a day (BID) | ORAL | 0 refills | Status: AC

## 2021-08-31 MED ORDER — SODIUM CHLORIDE 0.9 % IV SOLN: 1.0000 g | Freq: Once | INTRAVENOUS | Status: DC

## 2021-08-31 MED ORDER — SODIUM CHLORIDE 0.9 % IV SOLN
1.0000 g | Freq: Once | INTRAVENOUS | Status: AC
  Administered 2021-09-01: 1 g via INTRAVENOUS
  Filled 2021-08-31: qty 10

## 2021-08-31 MED ORDER — CEPHALEXIN 500 MG PO CAPS: 500.0000 mg | ORAL_CAPSULE | Freq: Two times a day (BID) | ORAL | 0 refills | Status: AC

## 2021-08-31 NOTE — Discharge Instructions (Addendum)
Please take the antibiotics as directed until finished.  Please follow-up with your primary care doctor.  As discussed, your CT showed some fairly significant inguinal hernias, please return if you have worsening abdominal pain, nausea, vomiting.  Your CT scan also showed some constipation, please take MiraLAX or other over-the-counter stool softeners.   ?

## 2021-08-31 NOTE — ED Provider Notes (Addendum)
?[facility] ?Provider Note ? ? ?CSN: [id number] ?Arrival date & time: 08/31/21  1658 ? ?  ? ?History ? ?Chief Complaint  ?Patient presents with  ? Urinary Retention  ? ? ?Gregory Kerr is a 81 y.o. male. ? ?HPI ?81 year old male with a extensive medical history including CHF, hypertension, stroke, spastic hemiparesis, basal cell carcinoma this to the ER from his facility with concerns for clogged suprapubic catheter.Discussed w/ RN at [facility] and she states that the foley has been clogged x 1 day.  Facility tried to flush it several times with no success.  Patient denies any significant abdominal pain or spasms.  He was seen here on 3/22 with similar complaint in addition to bladder spasms.  He was treated for UTI, cultures from 3/19 with Proteus and Enterococcus.  He was treated with Keflex and fosfomycin. Denies any nausea, vomiting, fevers ?  ? ?Home Medications ?Prior to Admission medications   ?Medication Sig Start Date End Date Taking? Authorizing Provider  ?cephALEXin (KEFLEX) 500 MG capsule Take 1 capsule (500 mg total) by mouth 2 (two) times daily for 7 days. 08/31/21 09/07/21 Yes Garald Balding, PA-C  ?nitrofurantoin, macrocrystal-monohydrate, (MACROBID) 100 MG capsule Take 1 capsule (100 mg total) by mouth 2 (two) times daily for 7 days. 08/31/21 09/07/21 Yes Garald Balding, PA-C  ?atorvastatin (LIPITOR) 40 MG tablet Take 1 tablet (40 mg total) by mouth daily. ?Patient taking differently: Take 40 mg by mouth every evening. 11/22/20   Madalyn Rob, MD  ?baclofen (LIORESAL) 20 MG tablet Take 20 mg by mouth 3 (three) times daily.    [provider]  ?bisacodyl (DULCOLAX) 10 MG suppository Place 10 mg rectally daily as needed for moderate constipation.    [provider]  ?calcium-vitamin D (OSCAL WITH D) 500-200 MG-UNIT tablet Take 1 tablet by mouth 2 (two) times daily.    [provider]  ?clopidogrel (PLAVIX) 75 MG tablet Take 1 tablet (75  mg total) by mouth daily. 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 For Pseudobulbar Affect (PBA)    [provider]  ?diclofenac Sodium (VOLTAREN) 1 % GEL Apply 2 g topically in the morning, at noon, and at bedtime. To right shoulder    [provider]  ?gabapentin (NEURONTIN) 100 MG capsule Take 100 mg by mouth 3 (three) times daily.    [provider]  ?guaiFENesin (MUCINEX) 600 MG 12 hr tablet Take 600 mg by mouth 2 (two) times daily.    [provider]  ?hydroxypropyl methylcellulose / hypromellose (ISOPTO TEARS / GONIOVISC) 2.5 % ophthalmic solution Place 1 drop into both eyes 3 (three) times daily.    [provider]  ?loperamide (IMODIUM A-D) 2 MG tablet Take 4 mg by mouth 4 (four) times daily as needed for diarrhea or loose stools.    [provider]  ?loratadine (CLARITIN) 10 MG tablet Take 10 mg by mouth daily.    [provider]  ?LORazepam (ATIVAN) 0.5 MG tablet Take 0.5 mg by mouth 2 (two) times daily as needed for anxiety.    [provider]  ?Miconazole-Zinc Oxide-Petrolat 0.25-15-81.35 % OINT Apply 1 application topically every 4 (four) hours. To penis and groin area    [provider]  ?mupirocin ointment (BACTROBAN) 2 % Apply 1 application topically 2 (two) times daily. Suprapubic site    [provider]  ?nitroGLYCERIN (NITROSTAT) 0.4 MG SL  tablet Place 0.4 mg under the tongue every 5 (five) minutes as needed for chest pain.    [provider]  ?Nutritional Supplements (BOOST PO) Take 1 Container by mouth daily. Lactose-Reduced 0..04 g/1 kcal/ml liquid    [provider]  ?pantoprazole (PROTONIX) 20 MG tablet Take 20 mg by mouth daily. 11/17/20   [provider]  ?phenazopyridine (PYRIDIUM) 100 MG tablet Take 100 mg by mouth every 8 (eight) hours as needed (bladder spasms).    [provider]   ?polyethylene glycol (MIRALAX / GLYCOLAX) packet Take 17 g by mouth daily as needed (constipation).     [provider]  ?Prenatal Vit-Fe Fumarate-FA (PREPLUS) 27-1 MG TABS Take 1 tablet by mouth daily.     [provider]  ?PROLIA 60 MG/ML SOSY injection Inject 60 mg into the skin every 6 (six) months. 09/07/20   [provider]  ?sertraline (ZOLOFT) 100 MG tablet Take 100 mg by mouth daily.    [provider]  ?solifenacin (VESICARE) 5 MG tablet Take 1 tablet (5 mg total) by mouth daily. 11/21/20   Madalyn Rob, MD  ?tamsulosin (FLOMAX) 0.4 MG CAPS capsule Take 1 capsule (0.4 mg total) by mouth at bedtime. ?Patient not taking: Reported on 06/28/2021 11/21/20   Madalyn Rob, MD  ?traMADol (ULTRAM) 50 MG tablet Take 50 mg by mouth See admin instructions. Take 50 mg twice daily scheduled, may take a 3rd 50 mg dose as needed for pain    [provider]  ?Vitamins A & D (VITAMIN A & D) ointment Apply 1 application topically in the morning, at noon, in the evening, and at bedtime. To abdominal folds, penis and scrotum along with nystatin    [provider]  ?   ? ?Allergies    ?Penicillins   ? ?Review of Systems   ?Review of Systems ?Ten systems reviewed and are negative for acute change, except as noted in the HPI.  ? ?Physical Exam ?Updated Vital Signs ?BP (!) 145/72 (BP Location: Right Arm)   Pulse 72   Temp 97.8 ?F (36.6 ?C) (Oral)   Resp 18   Ht '5\' 6"'$  (1.676 m)   SpO2 99%   BMI 26.63 kg/m?  ?Physical Exam ?Vitals and nursing note reviewed. Exam conducted with a chaperone present.  ?Constitutional:   ?   General: He is not in acute distress. ?   Appearance: He is well-developed.  ?HENT:  ?   Head: Normocephalic and atraumatic.  ?Eyes:  ?   Conjunctiva/sclera: Conjunctivae normal.  ?Cardiovascular:  ?   Rate and Rhythm: Normal rate and regular rhythm.  ?   Heart sounds: No murmur heard. ?Pulmonary:  ?   Effort: Pulmonary effort is normal. No respiratory distress.   ?   Breath sounds: Normal breath sounds.  ?Abdominal:  ?   Palpations: Abdomen is soft.  ?   Tenderness: There is no abdominal tenderness.  ?Genitourinary: ?   Comments: Suprapubic catheter dislodged, surrounding skin erythematous with questionable pus-like discharge from orifice (see photo) ?Musculoskeletal:     ?   General: No swelling.  ?   Cervical back: Neck supple.  ?Skin: ?   General: Skin is warm and dry.  ?   Capillary Refill: Capillary refill takes less than 2 seconds.  ?Neurological:  ?   Mental Status: He is alert.  ?Psychiatric:     ?   Mood and Affect: Mood normal.  ? ? ? ?ED Results / Procedures / Treatments   ?  Labs ?(all labs ordered are listed, but only abnormal results are displayed) ?Labs Reviewed  ?BASIC METABOLIC PANEL - Abnormal; Notable for the following components:  ?    Result Value  ? Glucose, Bld 102 (*)   ? Calcium 8.8 (*)   ? All other components within normal limits  ?CBC WITH DIFFERENTIAL/PLATELET  ?URINALYSIS, ROUTINE W REFLEX MICROSCOPIC  ? ? ?EKG ?None ? ?Radiology ?CT PELVIS WO CONTRAST ? ?Result Date: 08/31/2021 ?CLINICAL DATA:  Evaluate for suprapubic Foley catheter placement EXAM: CT PELVIS WITHOUT CONTRAST TECHNIQUE: Multidetector CT imaging of the pelvis was performed following the standard protocol without intravenous contrast. RADIATION DOSE REDUCTION: This exam was performed according to the departmental dose-optimization program which includes automated exposure control, adjustment of the mA and/or kV according to patient size and/or use of iterative reconstruction technique. COMPARISON:  CT from earlier in the same day FINDINGS: Urinary Tract: Bladder is decompressed by Foley catheter which is in satisfactory position. Some injected contrast was placed as well which lies within the bladder. Previously seen bladder calculi are not well appreciated on today's exam due to the contrast material. Bowel: Prominent fecal material is noted within the rectum which may represent a  degree of rectal impaction. No other obstructive or inflammatory changes are seen. Vascular/Lymphatic: Atherosclerotic calcifications are noted. No significant lymphadenopathy is noted. Reproductive:  Prostate is with

## 2021-08-31 NOTE — ED Triage Notes (Signed)
Patient arrives via EMS from [facility] with complaints of a clogged suprapubic catheter. Staff at facility states they attempted to flush catheter with no success.  ?

## 2021-09-01 DIAGNOSIS — N390 Urinary tract infection, site not specified: Secondary | ICD-10-CM | POA: Diagnosis not present

## 2021-09-01 LAB — URINALYSIS, ROUTINE W REFLEX MICROSCOPIC
Bilirubin Urine: NEGATIVE
Glucose, UA: NEGATIVE mg/dL
Ketones, ur: NEGATIVE mg/dL
Nitrite: POSITIVE — AB
Protein, ur: 100 mg/dL — AB
RBC / HPF: >50 RBC/hpf — ABNORMAL HIGH (ref 0–5)
Specific Gravity, Urine: >1.046 — ABNORMAL HIGH (ref 1.005–1.030)
WBC, UA: >50 WBC/hpf — ABNORMAL HIGH (ref 0–5)
pH: 7 (ref 5.0–8.0)

## 2021-09-01 NOTE — ED Notes (Signed)
[facility] at bedside for transport. Report given. ?

## 2021-09-01 NOTE — ED Notes (Signed)
[facility] contacted for transport 

## 2021-09-24 ENCOUNTER — Other Ambulatory Visit: Payer: Self-pay

## 2021-09-24 ENCOUNTER — Emergency Department (HOSPITAL_COMMUNITY)
Admission: EM | Admit: 2021-09-24 | Discharge: 2021-09-25 | Disposition: A | Discharge status: Skilled Nursing Facility | Payer: Medicare Other | Attending: Emergency Medicine | Admitting: Emergency Medicine

## 2021-09-24 ENCOUNTER — Encounter (HOSPITAL_COMMUNITY): Payer: Self-pay

## 2021-09-24 DIAGNOSIS — T83090A Other mechanical complication of cystostomy catheter, initial encounter: Secondary | ICD-10-CM | POA: Insufficient documentation

## 2021-09-24 DIAGNOSIS — Y828 Other medical devices associated with adverse incidents: Secondary | ICD-10-CM | POA: Diagnosis not present

## 2021-09-24 DIAGNOSIS — T83010A Breakdown (mechanical) of cystostomy catheter, initial encounter: Secondary | ICD-10-CM

## 2021-09-24 NOTE — ED Triage Notes (Signed)
PT BIB EMS from [facility] after his Suprapubic catheter was dislodged and taken out by the facility. Pt here to get a new one placed.  ?

## 2021-09-25 DIAGNOSIS — T83090A Other mechanical complication of cystostomy catheter, initial encounter: Secondary | ICD-10-CM | POA: Diagnosis not present

## 2021-09-25 NOTE — Discharge Instructions (Addendum)
Your suprapubic catheter was replaced with a 14 French catheter.  You will likely need to follow-up with urology to have a larger one placed in the future.  Please call [facility] to schedule a follow-up appointment. ?

## 2021-09-25 NOTE — ED Notes (Signed)
Ptar called 

## 2021-09-25 NOTE — ED Provider Notes (Signed)
?[facility] ?Provider Note ? ? ?CSN: [id number] ?Arrival date & time: 09/24/21  2228 ? ?  ? ?History ? ?Chief Complaint  ?Patient presents with  ? Catheter Displacement   ? ? ?RONRICO DUPIN is a 81 y.o. male. ? ?The history is provided by the patient and the EMS personnel.  ?LI FRAGOSO is a 81 y.o. male who presents to the Emergency Department complaining of catheter dislodgment.  He has a history of suprapubic catheter in place.  He states that it became dislodged sometime around lunchtime.  He thinks that it got pulled out.  No fevers, vomiting, difficulty breathing, abdominal pain.  He states that this is a recurrent problem with the catheter. ?  ? ?Home Medications ?Prior to Admission medications   ?Medication Sig Start Date End Date Taking? Authorizing Provider  ?atorvastatin (LIPITOR) 40 MG tablet Take 1 tablet (40 mg total) by mouth daily. ?Patient taking differently: Take 40 mg by mouth every evening. 11/22/20   Madalyn Rob, MD  ?baclofen (LIORESAL) 20 MG tablet Take 20 mg by mouth 3 (three) times daily.    [provider]  ?bisacodyl (DULCOLAX) 10 MG suppository Place 10 mg rectally daily as needed for moderate constipation.    [provider]  ?calcium-vitamin D (OSCAL WITH D) 500-200 MG-UNIT tablet Take 1 tablet by mouth 2 (two) times daily.    [provider]  ?clopidogrel (PLAVIX) 75 MG tablet Take 1 tablet (75 mg total) by mouth daily. 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 For Pseudobulbar Affect (PBA)    [provider]  ?diclofenac Sodium (VOLTAREN) 1 % GEL Apply 2 g topically in the morning, at noon, and at bedtime. To right shoulder    [provider]  ?gabapentin (NEURONTIN) 100 MG capsule Take 100 mg by mouth 3 (three) times daily.    [provider]  ?guaiFENesin (MUCINEX) 600 MG 12 hr tablet Take 600 mg by mouth 2 (two) times  daily.    [provider]  ?hydroxypropyl methylcellulose / hypromellose (ISOPTO TEARS / GONIOVISC) 2.5 % ophthalmic solution Place 1 drop into both eyes 3 (three) times daily.    [provider]  ?loperamide (IMODIUM A-D) 2 MG tablet Take 4 mg by mouth 4 (four) times daily as needed for diarrhea or loose stools.    [provider]  ?loratadine (CLARITIN) 10 MG tablet Take 10 mg by mouth daily.    [provider]  ?LORazepam (ATIVAN) 0.5 MG tablet Take 0.5 mg by mouth 2 (two) times daily as needed for anxiety.    [provider]  ?Miconazole-Zinc Oxide-Petrolat 0.25-15-81.35 % OINT Apply 1 application topically every 4 (four) hours. To penis and groin area    [provider]  ?mupirocin ointment (BACTROBAN) 2 % Apply 1 application topically 2 (two) times daily. Suprapubic site    [provider]  ?nitroGLYCERIN (NITROSTAT) 0.4 MG SL tablet Place 0.4 mg under the tongue every 5 (five) minutes as needed for chest pain.    [provider]  ?Nutritional Supplements (BOOST PO) Take 1 Container by mouth daily. Lactose-Reduced 0..04 g/1 kcal/ml liquid    [provider]  ?pantoprazole (PROTONIX) 20 MG tablet Take 20 mg by mouth daily. 11/17/20   [provider]  ?phenazopyridine (PYRIDIUM) 100 MG tablet Take 100 mg by mouth every 8 (eight) hours as needed (bladder spasms).    [provider]  ?polyethylene glycol (MIRALAX / GLYCOLAX) packet Take 17 g by mouth daily as needed (constipation).     [provider]  ?Prenatal Vit-Fe Fumarate-FA (PREPLUS) 27-1 MG TABS Take 1 tablet by mouth daily.     [provider]  ?PROLIA 60 MG/ML SOSY injection Inject 60 mg into the skin every 6 (six) months. 09/07/20   [provider]  ?sertraline (ZOLOFT) 100 MG tablet Take 100 mg by mouth daily.    [provider]  ?solifenacin (VESICARE) 5 MG tablet Take 1 tablet (5 mg total) by mouth daily. 11/21/20    Madalyn Rob, MD  ?tamsulosin (FLOMAX) 0.4 MG CAPS capsule Take 1 capsule (0.4 mg total) by mouth at bedtime. ?Patient not taking: Reported on 06/28/2021 11/21/20   Madalyn Rob, MD  ?traMADol (ULTRAM) 50 MG tablet Take 50 mg by mouth See admin instructions. Take 50 mg twice daily scheduled, may take a 3rd 50 mg dose as needed for pain    [provider]  ?Vitamins A & D (VITAMIN A & D) ointment Apply 1 application topically in the morning, at noon, in the evening, and at bedtime. To abdominal folds, penis and scrotum along with nystatin    [provider]  ?   ? ?Allergies    ?Penicillins   ? ?Review of Systems   ?Review of Systems  ?All other systems reviewed and are negative. ? ?Physical Exam ?Updated Vital Signs ?BP (!) 171/75   Pulse 73   Temp 98 ?F (36.7 ?C) (Oral)   Resp 16   SpO2 95%  ?Physical Exam ?Vitals and nursing note reviewed.  ?Constitutional:   ?   Appearance: He is well-developed.  ?   Comments: Chronically ill-appearing  ?HENT:  ?   Head: Normocephalic and atraumatic.  ?Cardiovascular:  ?   Rate and Rhythm: Normal rate and regular rhythm.  ?Pulmonary:  ?   Effort: Pulmonary effort is normal. No respiratory distress.  ?Abdominal:  ?   Palpations: Abdomen is soft.  ?   Tenderness: There is no abdominal tenderness. There is no guarding or rebound.  ?Genitourinary: ?   Comments: There is a stoma in the suprapubic region.  There is local surrounding skin changes with erythema and chronic appearing thickening.  No local tenderness ?Musculoskeletal:     ?   General: No tenderness.  ?Skin: ?   General: Skin is warm and dry.  ?Neurological:  ?   Mental Status: He is alert and oriented to person, place, and time.  ?Psychiatric:     ?   Behavior: Behavior normal.  ? ? ?ED Results / Procedures / Treatments   ?Labs ?(all labs ordered are listed, but only abnormal results are displayed) ?Labs Reviewed - No data to display ? ?EKG ?None ? ?Radiology ?No results found. ? ?Procedures ?SUPRAPUBIC  TUBE PLACEMENT ? ?Date/Time: 09/25/2021 7:24 AM ?Performed by: Quintella Reichert, MD ?Authorized by: Quintella Reichert, MD  ? ?Consent:  ?  Consent obtained:  Verbal ?  Consent given by:  Patient ?  Risks discussed:  Bleeding, infection and pain ?Universal protocol:  ?  Patient identity confirmed:  Verbally with patient ?Sedation:  ?  Sedation type:  None ?Anesthesia:  ?  Anesthesia method:  None ?Procedure details:  ?  Complexity:  Simple ?  Catheter type:  Foley ?  Number of attempts:  2 ?  Urine characteristics:  Yellow ?Post-procedure details:  ?  Procedure completion:  Tolerated well, no immediate complications ?Comments:  ?  Suprapubic catheter was placed through prior tract.  Initially attempted with 63 French catheter, unable to place due to significant resistance.  Second attempt performed with a 14 French catheter, placed without difficulty with return of yellow urine.  ? ? ?Medications Ordered in ED ?Medications - No data to display ? ?ED Course/ Medical Decision Making/ A&P ?  ?                        ?Medical Decision Making ? ? ?Patient with chronic suprapubic catheter in place here for evaluation of dislodgment.  He thinks it came out around lunchtime today.  He would prefer for this not to be replaced but after discussion of risks and benefits patient is in agreement with having it replaced.  Unable to place a 33 French catheter, which he most recently had.  A 14 French was placed with return of yellow urine.  There is a small amount of blood externally after placement of the catheter with yellow urine in the catheter bag.  Discussed with patient that he will need to follow-up with urology, as he may need a larger catheter placed in the future.  Plan to discharge back to facility.  He does have some erythema around the stoma site but this appears chronic in nature.  Does not appear to have local cellulitis or acute infectious process. ? ? ? ? ? ? ? ?Final Clinical Impression(s) / ED Diagnoses ?Final  diagnoses:  ?Suprapubic catheter dysfunction, initial encounter ([place])  ? ? ?Rx / DC Orders ?ED Discharge Orders   ? ? None  ? ?  ? ? ?  ?Quintella Reichert, MD ?09/25/21 0725 ? ?

## 2021-10-07 ENCOUNTER — Emergency Department (HOSPITAL_COMMUNITY)
Admission: EM | Admit: 2021-10-07 | Discharge: 2021-10-08 | Disposition: A | Discharge status: Skilled Nursing Facility | Payer: Medicare Other | Attending: Emergency Medicine | Admitting: Emergency Medicine

## 2021-10-07 ENCOUNTER — Other Ambulatory Visit: Payer: Self-pay

## 2021-10-07 DIAGNOSIS — Z79899 Other long term (current) drug therapy: Secondary | ICD-10-CM | POA: Diagnosis not present

## 2021-10-07 DIAGNOSIS — X58XXXA Exposure to other specified factors, initial encounter: Secondary | ICD-10-CM | POA: Insufficient documentation

## 2021-10-07 DIAGNOSIS — R32 Unspecified urinary incontinence: Secondary | ICD-10-CM | POA: Diagnosis not present

## 2021-10-07 DIAGNOSIS — I69398 Other sequelae of cerebral infarction: Secondary | ICD-10-CM | POA: Insufficient documentation

## 2021-10-07 DIAGNOSIS — Z87891 Personal history of nicotine dependence: Secondary | ICD-10-CM | POA: Insufficient documentation

## 2021-10-07 DIAGNOSIS — I10 Essential (primary) hypertension: Secondary | ICD-10-CM | POA: Insufficient documentation

## 2021-10-07 DIAGNOSIS — Z5329 Procedure and treatment not carried out because of patient's decision for other reasons: Secondary | ICD-10-CM | POA: Insufficient documentation

## 2021-10-07 DIAGNOSIS — T83018A Breakdown (mechanical) of other indwelling urethral catheter, initial encounter: Secondary | ICD-10-CM | POA: Insufficient documentation

## 2021-10-07 DIAGNOSIS — T83010S Breakdown (mechanical) of cystostomy catheter, sequela: Secondary | ICD-10-CM

## 2021-10-07 NOTE — ED Triage Notes (Signed)
Pt BIB [facility] from [facility] d/t suprapubic catheter being dislodged. Pt stated it's from the continuous transfers from staff members. New cath will need to be placed.

## 2021-10-08 DIAGNOSIS — T83018A Breakdown (mechanical) of other indwelling urethral catheter, initial encounter: Secondary | ICD-10-CM | POA: Diagnosis not present

## 2021-10-08 LAB — I-STAT CHEM 8, ED
BUN: 18 mg/dL (ref 8–23)
Calcium, Ion: 1.05 mmol/L — ABNORMAL LOW (ref 1.15–1.40)
Chloride: 110 mmol/L (ref 98–111)
Creatinine, Ser: 0.5 mg/dL — ABNORMAL LOW (ref 0.61–1.24)
Glucose, Bld: 118 mg/dL — ABNORMAL HIGH (ref 70–99)
HCT: 41 % (ref 39.0–52.0)
Hemoglobin: 13.9 g/dL (ref 13.0–17.0)
Potassium: 4 mmol/L (ref 3.5–5.1)
Sodium: 142 mmol/L (ref 135–145)
TCO2: 24 mmol/L (ref 22–32)

## 2021-10-08 LAB — CBC WITH DIFFERENTIAL/PLATELET
Abs Immature Granulocytes: 0.02 10*3/uL (ref 0.00–0.07)
Basophils Absolute: 0.1 10*3/uL (ref 0.0–0.1)
Basophils Relative: 1 %
Eosinophils Absolute: 0.2 10*3/uL (ref 0.0–0.5)
Eosinophils Relative: 2 %
HCT: 43.9 % (ref 39.0–52.0)
Hemoglobin: 13.8 g/dL (ref 13.0–17.0)
Immature Granulocytes: 0 %
Lymphocytes Relative: 20 %
Lymphs Abs: 1.3 10*3/uL (ref 0.7–4.0)
MCH: 28.4 pg (ref 26.0–34.0)
MCHC: 31.4 g/dL (ref 30.0–36.0)
MCV: 90.3 fL (ref 80.0–100.0)
Monocytes Absolute: 0.4 10*3/uL (ref 0.1–1.0)
Monocytes Relative: 7 %
Neutro Abs: 4.5 10*3/uL (ref 1.7–7.7)
Neutrophils Relative %: 70 %
Platelets: 164 10*3/uL (ref 150–400)
RBC: 4.86 MIL/uL (ref 4.22–5.81)
RDW: 13.8 % (ref 11.5–15.5)
WBC: 6.4 10*3/uL (ref 4.0–10.5)
nRBC: 0 % (ref 0.0–0.2)

## 2021-10-08 LAB — PROTIME-INR
INR: 1 (ref 0.8–1.2)
Prothrombin Time: 12.9 seconds (ref 11.4–15.2)

## 2021-10-08 NOTE — ED Notes (Signed)
Pt given water and coke to drink.

## 2021-10-08 NOTE — ED Provider Notes (Signed)
[facility] EMERGENCY DEPARTMENT Provider Note   CSN: [id number] Arrival date & time: 10/07/21  2203     History  Chief Complaint  Patient presents with   suprapubic cath replacement    Gregory Kerr is a 81 y.o. male.  The history is provided by the patient and the EMS personnel.  Illness Location:  Suprapubic catheter Quality:  Pulled out Severity:  Severe Onset quality:  Sudden Duration:  8 hours Timing:  Constant Progression:  Unchanged Chronicity:  Recurrent Context:  Has catheter in place Relieved by:  Nothing Worsened by:  Nothing Ineffective treatments:  None Associated symptoms: no fever, no vomiting and no wheezing       Home Medications Prior to Admission medications   Medication Sig Start Date End Date Taking? Authorizing Provider  atorvastatin (LIPITOR) 40 MG tablet Take 1 tablet (40 mg total) by mouth daily. Patient taking differently: Take 40 mg by mouth every evening. 11/22/20   Madalyn Rob, MD  baclofen (LIORESAL) 20 MG tablet Take 20 mg by mouth 3 (three) times daily.    [provider]  bisacodyl (DULCOLAX) 10 MG suppository Place 10 mg rectally daily as needed for moderate constipation.    [provider]  calcium-vitamin D (OSCAL WITH D) 500-200 MG-UNIT tablet Take 1 tablet by mouth 2 (two) times daily.    [provider]  clopidogrel (PLAVIX) 75 MG tablet Take 1 tablet (75 mg total) by mouth daily. 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 To right shoulder    [provider]  gabapentin (NEURONTIN) 100 MG capsule Take 100 mg by mouth 3 (three) times daily.    [provider]  guaiFENesin (MUCINEX) 600 MG 12 hr tablet Take 600 mg by mouth 2 (two) times daily.     [provider]  hydroxypropyl methylcellulose / hypromellose (ISOPTO TEARS / GONIOVISC) 2.5 % ophthalmic solution Place 1 drop into both eyes 3 (three) times daily.    [provider]  loperamide (IMODIUM A-D) 2 MG tablet Take 4 mg by mouth 4 (four) times daily as needed for diarrhea or loose stools.    [provider]  loratadine (CLARITIN) 10 MG tablet Take 10 mg by mouth daily.    [provider]  LORazepam (ATIVAN) 0.5 MG tablet Take 0.5 mg by mouth 2 (two) times daily as needed for anxiety.    [provider]  Miconazole-Zinc Oxide-Petrolat 0.25-15-81.35 % OINT Apply 1 application topically every 4 (four) hours. To penis and groin area    [provider]  mupirocin ointment (BACTROBAN) 2 % Apply 1 application topically 2 (two) times daily. Suprapubic site    [provider]  nitroGLYCERIN (NITROSTAT) 0.4 MG SL tablet Place 0.4 mg under the tongue every 5 (five) minutes as needed for chest pain.    [provider]  Nutritional Supplements (BOOST PO) Take 1 Container by mouth daily. Lactose-Reduced 0..04 g/1 kcal/ml liquid    [provider]  pantoprazole (PROTONIX) 20 MG tablet Take 20 mg by mouth daily. 11/17/20   [provider]  phenazopyridine (PYRIDIUM) 100 MG tablet Take 100 mg by mouth every 8 (eight) hours as needed (bladder spasms).    [provider]  polyethylene glycol (MIRALAX / GLYCOLAX)  packet Take 17 g by mouth daily as needed (constipation).     [provider]  Prenatal Vit-Fe Fumarate-FA (PREPLUS) 27-1 MG TABS Take 1 tablet by mouth daily.     [provider]  PROLIA 60 MG/ML SOSY injection Inject 60 mg into the skin every 6 (six) months. 09/07/20   [provider]  sertraline (ZOLOFT) 100 MG tablet Take 100 mg by mouth daily.    [provider]  solifenacin (VESICARE) 5 MG tablet Take 1 tablet (5 mg total) by mouth daily. 11/21/20   Madalyn Rob,  MD  tamsulosin (FLOMAX) 0.4 MG CAPS capsule Take 1 capsule (0.4 mg total) by mouth at bedtime. Patient not taking: Reported on 06/28/2021 11/21/20   Madalyn Rob, MD  traMADol (ULTRAM) 50 MG tablet Take 50 mg by mouth See admin instructions. Take 50 mg twice daily scheduled, may take a 3rd 50 mg dose as needed for pain    [provider]  Vitamins A & D (VITAMIN A & D) ointment Apply 1 application topically in the morning, at noon, in the evening, and at bedtime. To abdominal folds, penis and scrotum along with nystatin    [provider]      Allergies    Penicillins    Review of Systems   Review of Systems  Constitutional:  Negative for fever.  HENT:  Negative for facial swelling.   Respiratory:  Negative for wheezing.   Gastrointestinal:  Negative for vomiting.  Genitourinary:  Negative for dysuria and flank pain.  All other systems reviewed and are negative.  Physical Exam Updated Vital Signs BP (!) 164/73   Pulse 82   Temp 98.1 F (36.7 C) (Oral)   Resp 16   Ht '5\' 9"'$  (1.753 m)   Wt 74.8 kg   SpO2 94%   BMI 24.37 kg/m  Physical Exam Vitals and nursing note reviewed.  Constitutional:      General: He is not in acute distress.    Appearance: Normal appearance.  HENT:     Head: Normocephalic and atraumatic.     Nose: Nose normal.  Eyes:     Conjunctiva/sclera: Conjunctivae normal.     Pupils: Pupils are equal, round, and reactive to light.  Cardiovascular:     Rate and Rhythm: Normal rate and regular rhythm.     Pulses: Normal pulses.     Heart sounds: Normal heart sounds.  Pulmonary:     Effort: Pulmonary effort is normal.     Breath sounds: Normal breath sounds.  Abdominal:     General: Bowel sounds are normal.     Palpations: Abdomen is soft.     Tenderness: There is no abdominal tenderness. There is no guarding.     Comments: Catheter sited sealed, no lumen into bladder   Musculoskeletal:        General: Normal range of motion.     Cervical  back: Normal range of motion and neck supple.  Skin:    General: Skin is warm and dry.     Capillary Refill: Capillary refill takes less than 2 seconds.  Neurological:     General: No focal deficit present.     Mental Status: He is alert and oriented to person, place, and time.     Deep Tendon Reflexes: Reflexes normal.  Psychiatric:        Mood and Affect: Mood normal.        Behavior: Behavior normal.    ED Results / Procedures /  Treatments   Labs (all labs ordered are listed, but only abnormal results are displayed) Results for orders placed or performed during the hospital encounter of 10/07/21  CBC with Differential  Result Value Ref Range   WBC 6.4 4.0 - 10.5 K/uL   RBC 4.86 4.22 - 5.81 MIL/uL   Hemoglobin 13.8 13.0 - 17.0 g/dL   HCT 43.9 39.0 - 52.0 %   MCV 90.3 80.0 - 100.0 fL   MCH 28.4 26.0 - 34.0 pg   MCHC 31.4 30.0 - 36.0 g/dL   RDW 13.8 11.5 - 15.5 %   Platelets 164 150 - 400 K/uL   nRBC 0.0 0.0 - 0.2 %   Neutrophils Relative % 70 %   Neutro Abs 4.5 1.7 - 7.7 K/uL   Lymphocytes Relative 20 %   Lymphs Abs 1.3 0.7 - 4.0 K/uL   Monocytes Relative 7 %   Monocytes Absolute 0.4 0.1 - 1.0 K/uL   Eosinophils Relative 2 %   Eosinophils Absolute 0.2 0.0 - 0.5 K/uL   Basophils Relative 1 %   Basophils Absolute 0.1 0.0 - 0.1 K/uL   Immature Granulocytes 0 %   Abs Immature Granulocytes 0.02 0.00 - 0.07 K/uL  Protime-INR  Result Value Ref Range   Prothrombin Time 12.9 11.4 - 15.2 seconds   INR 1.0 0.8 - 1.2  I-stat chem 8, ED (not at [facility] or [facility])  Result Value Ref Range   Sodium 142 135 - 145 mmol/L   Potassium 4.0 3.5 - 5.1 mmol/L   Chloride 110 98 - 111 mmol/L   BUN 18 8 - 23 mg/dL   Creatinine, Ser 0.50 (L) 0.61 - 1.24 mg/dL   Glucose, Bld 118 (H) 70 - 99 mg/dL   Calcium, Ion 1.05 (L) 1.15 - 1.40 mmol/L   TCO2 24 22 - 32 mmol/L   Hemoglobin 13.9 13.0 - 17.0 g/dL   HCT 41.0 39.0 - 52.0 %   No results found.   Radiology No results  found.  Procedures Procedures    Medications Ordered in ED Medications - No data to display  ED Course/ Medical Decision Making/ A&P                           Medical Decision Making Suprapubic catheter out lumen is closed off   Amount and/or Complexity of Data Reviewed Independent Historian: EMS    Details: see above External Data Reviewed: notes.    Details: previous notes reviewed Labs: ordered.    Details: all labs reviewed:   normal sodium 142 and creatinine .5, normal PT and INR.  Normal CBC with normal white count 6.4 and normal hemoglobin 13.8 and normal platelet count Discussion of management or test interpretation with external provider(s): Case d/w Dr. Gloriann Loan, attempt foley from below, if unable to pass then call back and Dr. Gloriann Loan will discuss new suprapubic catheter with IR  Seen by Dr. Gloriann Loan at bedside patient refused repeat suprapubic and also foley catheter.  Patient urinated for Dr. Gloriann Loan.  Please watch and ensure that patient is urinating and if so dc  Risk Risk Details: Patient is urinating in the ED and has been bladder scanned multiple times without retention.  I have spoken to patient at length and he is refusing foley catheter and reinsertion of suprapubic cath.  Stable for discharge    Final Clinical Impression(s) / ED Diagnoses Final diagnoses:  None    Return for intractable cough, coughing up  blood, fevers > 100.4 unrelieved by medication, shortness of breath, intractable vomiting, chest pain, shortness of breath, weakness, numbness, changes in speech, facial asymmetry, abdominal pain, passing out, Inability to tolerate liquids or food, cough, altered mental status or any concerns. No signs of systemic illness or infection. The patient is nontoxic-appearing on exam and vital signs are within normal limits.  I have reviewed the triage vital signs and the nursing notes. Pertinent labs & imaging results that were available during my care of the patient were  reviewed by me and considered in my medical decision making (see chart for details). After history, exam, and medical workup I feel the patient has been appropriately medically screened and is safe for discharge home. Pertinent diagnoses were discussed with the patient. Patient was given return precautions.   Rx / DC Orders ED Discharge Orders     None         Khalil Belote, MD 10/08/21 0600

## 2021-10-08 NOTE — ED Notes (Signed)
Per urologist, pt refusing any of the care that urologist offered. This RN verified with pt and that is the case.

## 2021-10-08 NOTE — Consult Note (Signed)
H&P Physician requesting consult: April Palumbo, MD  Chief Complaint: Displaced suprapubic tube, difficult Foley catheter, urinary incontinence  History of Present Illness: 81 year old male followed by Dr. Lovena Neighbours at [facility].  He has multiple medical comorbidities.  He has had a couple of strokes.  This resulted in severe incontinence.  He elected to manage his bladder with a suprapubic tube.  He has been having a lot of issues with displacement of the suprapubic tube and is tired of it.  Today, he presented after the tube was displaced.  It was displaced several hours prior to arrival.  Replacement was attempted and it seemed like the tract had closed.  Foley catheter was also reportedly tried but the patient states that it was not.  Regardless, the patient has been incontinent.  I personally offered to try to replace his suprapubic tube and try to get access with a wire, etc.  Also personally offered to place Foley catheter.  Patient was adamantly against any further attempts.  He stated that he was fine just being incontinent and wearing depends.  He is tired of going back and forth to the emergency room.  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   Pneumonia    "once or twice" (09/22/2014)   Retinal detachment    left eye   Stroke [facility]) 2007   "left side doesn't work now" (09/22/2014)   Past Surgical History:  Procedure Laterality Date   APPENDECTOMY  05/27/2009   Ronnald Collum, MD   CATARACT EXTRACTION W/ INTRAOCULAR LENS  IMPLANT, BILATERAL Bilateral ?2014   CHOLECYSTECTOMY N/A 09/26/2014   Procedure: LAPAROSCOPIC CHOLECYSTECTOMY WITH INTRAOPERATIVE CHOLANGIOGRAM;  Surgeon: Donnie Mesa, MD;   Location: [place];  Service: General;  Laterality: N/A;   COLONOSCOPY WITH PROPOFOL N/A 12/29/2015   Procedure: COLONOSCOPY WITH PROPOFOL;  Surgeon: Wilford Corner, MD;  Location: [facility] ENDOSCOPY;  Service: Endoscopy;  Laterality: N/A;   ERCP N/A 09/25/2014   Procedure: ENDOSCOPIC RETROGRADE CHOLANGIOPANCREATOGRAPHY (ERCP);  Surgeon: Clarene Essex, MD;  Location: [facility] ENDOSCOPY;  Service: Endoscopy;  Laterality: N/A;   IR CATHETER TUBE CHANGE  05/24/2021   PARS PLANA VITRECTOMY Left 01/30/2019   Procedure: PARS PLANA VITRECTOMY WITH 25 GAUGE;  Surgeon: Sherlynn Stalls, MD;  Location: [place];  Service: Ophthalmology;  Laterality: Left;   PARS PLANA VITRECTOMY Right 11/13/2019   Procedure: PARS PLANA VITRECTOMY WITH 25 GAUGE WITH REMOVAL OF POSTERIOR CAPSULAR OPACIFICATION WITH VITRECTOR;  Surgeon: Sherlynn Stalls, MD;  Location: [place];  Service: Ophthalmology;  Laterality: Right;   PHOTOCOAGULATION WITH LASER Left 01/30/2019   Procedure: PHOTOCOAGULATION WITH LASER;  Surgeon: Sherlynn Stalls, MD;  Location: [place];  Service: Ophthalmology;  Laterality: Left;    Home Medications:  (Not in a hospital admission)  Allergies:  Allergies  Allergen Reactions   Penicillins Rash    ++++Patient in nursing home. Unable to clarify details/severity of the reaction.++++    Family History  Problem Relation Age of Onset   Heart attack Mother    Heart attack Father    Cancer Maternal Aunt        Stomach   Cancer Paternal Aunt        Uterine,Colon   Social History:  reports that he quit smoking about 15 years ago. His smoking use included cigarettes. He smoked an average of 1 pack per day. He has never used smokeless tobacco. He reports that he does not currently use alcohol. He reports that he does not use drugs.  ROS: A complete review of systems was performed.  All systems are negative except for pertinent findings as noted. ROS   Physical Exam:  Vital signs in last 24 hours: Temp:  [98.1 F (36.7 C)] 98.1 F  (36.7 C) (05/19 2214) Pulse Rate:  [73-97] 73 (05/20 0600) Resp:  [16] 16 (05/20 0600) BP: (150-173)/(72-103) 154/75 (05/20 0600) SpO2:  [92 %-96 %] 94 % (05/20 0600) Weight:  [74.8 kg] 74.8 kg (05/19 2211) General:  Alert and oriented, No acute distress Patient is contracted in the upper extremities Patient has a circumcised phallus.  There is some chronic erythema likely secondary to his incontinence.  Does not appear infected.  Suprapubic tube tract in place, unclear if any access remains to the bladder.  Laboratory Data:  Results for orders placed or performed during the hospital encounter of 10/07/21 (from the past 24 hour(s))  CBC with Differential     Status: None   Collection Time: 10/07/21 11:55 PM  Result Value Ref Range   WBC 6.4 4.0 - 10.5 K/uL   RBC 4.86 4.22 - 5.81 MIL/uL   Hemoglobin 13.8 13.0 - 17.0 g/dL   HCT 43.9 39.0 - 52.0 %   MCV 90.3 80.0 - 100.0 fL   MCH 28.4 26.0 - 34.0 pg   MCHC 31.4 30.0 - 36.0 g/dL   RDW 13.8 11.5 - 15.5 %   Platelets 164 150 - 400 K/uL   nRBC 0.0 0.0 - 0.2 %   Neutrophils Relative % 70 %   Neutro Abs 4.5 1.7 - 7.7 K/uL   Lymphocytes Relative 20 %   Lymphs Abs 1.3 0.7 - 4.0 K/uL   Monocytes Relative 7 %   Monocytes Absolute 0.4 0.1 - 1.0 K/uL   Eosinophils Relative 2 %   Eosinophils Absolute 0.2 0.0 - 0.5 K/uL   Basophils Relative 1 %   Basophils Absolute 0.1 0.0 - 0.1 K/uL   Immature Granulocytes 0 %   Abs Immature Granulocytes 0.02 0.00 - 0.07 K/uL  Protime-INR     Status: None   Collection Time: 10/07/21 11:55 PM  Result Value Ref Range   Prothrombin Time 12.9 11.4 - 15.2 seconds   INR 1.0 0.8 - 1.2  I-stat chem 8, ED (not at [facility] or [facility])     Status: Abnormal   Collection Time: 10/08/21 12:25 AM  Result Value Ref Range   Sodium 142 135 - 145 mmol/L   Potassium 4.0 3.5 - 5.1 mmol/L   Chloride 110 98 - 111 mmol/L   BUN 18 8 - 23 mg/dL   Creatinine, Ser 0.50 (L) 0.61 - 1.24 mg/dL   Glucose, Bld 118 (H) 70 - 99 mg/dL    Calcium, Ion 1.05 (L) 1.15 - 1.40 mmol/L   TCO2 24 22 - 32 mmol/L   Hemoglobin 13.9 13.0 - 17.0 g/dL   HCT 41.0 39.0 - 52.0 %   No results found for this or any previous visit (from the past 240 hour(s)). Creatinine: Recent Labs    10/08/21 0025  CREATININE 0.50*    Impression/Assessment:  Severe urinary incontinence Displaced suprapubic tube Difficult Foley catheter placement?  Plan:  Patient declined any further attempts at catheter replacement.  Multiple bladder scans have  demonstrated that he is emptying his bladder appropriately but is incontinent.  He wants to remain this way rather than replace his tube.  Marton Redwood, III 10/08/2021, 11:11 AM

## 2021-10-12 ENCOUNTER — Inpatient Hospital Stay (HOSPITAL_COMMUNITY)
Admission: EM | Admit: 2021-10-12 | Discharge: 2021-10-18 | DRG: 872 | Disposition: A | Discharge status: Skilled Nursing Facility | Payer: Medicare Other | Source: Skilled Nursing Facility | Attending: Family Medicine | Admitting: Family Medicine

## 2021-10-12 ENCOUNTER — Emergency Department (HOSPITAL_COMMUNITY): Payer: Medicare Other

## 2021-10-12 DIAGNOSIS — A4159 Other Gram-negative sepsis: Principal | ICD-10-CM | POA: Diagnosis present

## 2021-10-12 DIAGNOSIS — N1831 Chronic kidney disease, stage 3a: Secondary | ICD-10-CM | POA: Diagnosis present

## 2021-10-12 DIAGNOSIS — E872 Acidosis, unspecified: Secondary | ICD-10-CM | POA: Diagnosis present

## 2021-10-12 DIAGNOSIS — Z880 Allergy status to penicillin: Secondary | ICD-10-CM | POA: Diagnosis not present

## 2021-10-12 DIAGNOSIS — F32A Depression, unspecified: Secondary | ICD-10-CM | POA: Diagnosis present

## 2021-10-12 DIAGNOSIS — I5032 Chronic diastolic (congestive) heart failure: Secondary | ICD-10-CM | POA: Diagnosis present

## 2021-10-12 DIAGNOSIS — B964 Proteus (mirabilis) (morganii) as the cause of diseases classified elsewhere: Secondary | ICD-10-CM | POA: Diagnosis not present

## 2021-10-12 DIAGNOSIS — N3289 Other specified disorders of bladder: Secondary | ICD-10-CM | POA: Diagnosis present

## 2021-10-12 DIAGNOSIS — E876 Hypokalemia: Secondary | ICD-10-CM | POA: Diagnosis present

## 2021-10-12 DIAGNOSIS — K589 Irritable bowel syndrome without diarrhea: Secondary | ICD-10-CM | POA: Diagnosis present

## 2021-10-12 DIAGNOSIS — M810 Age-related osteoporosis without current pathological fracture: Secondary | ICD-10-CM | POA: Diagnosis present

## 2021-10-12 DIAGNOSIS — Z808 Family history of malignant neoplasm of other organs or systems: Secondary | ICD-10-CM

## 2021-10-12 DIAGNOSIS — F419 Anxiety disorder, unspecified: Secondary | ICD-10-CM | POA: Diagnosis present

## 2021-10-12 DIAGNOSIS — J449 Chronic obstructive pulmonary disease, unspecified: Secondary | ICD-10-CM | POA: Diagnosis present

## 2021-10-12 DIAGNOSIS — Z800 Family history of malignant neoplasm of digestive organs: Secondary | ICD-10-CM

## 2021-10-12 DIAGNOSIS — Z7902 Long term (current) use of antithrombotics/antiplatelets: Secondary | ICD-10-CM | POA: Diagnosis not present

## 2021-10-12 DIAGNOSIS — K219 Gastro-esophageal reflux disease without esophagitis: Secondary | ICD-10-CM | POA: Diagnosis present

## 2021-10-12 DIAGNOSIS — R7881 Bacteremia: Secondary | ICD-10-CM

## 2021-10-12 DIAGNOSIS — Z79899 Other long term (current) drug therapy: Secondary | ICD-10-CM | POA: Diagnosis not present

## 2021-10-12 DIAGNOSIS — Z20822 Contact with and (suspected) exposure to covid-19: Secondary | ICD-10-CM | POA: Diagnosis present

## 2021-10-12 DIAGNOSIS — G8929 Other chronic pain: Secondary | ICD-10-CM | POA: Diagnosis present

## 2021-10-12 DIAGNOSIS — N179 Acute kidney failure, unspecified: Secondary | ICD-10-CM | POA: Diagnosis not present

## 2021-10-12 DIAGNOSIS — A419 Sepsis, unspecified organism: Secondary | ICD-10-CM | POA: Diagnosis present

## 2021-10-12 DIAGNOSIS — I2510 Atherosclerotic heart disease of native coronary artery without angina pectoris: Secondary | ICD-10-CM | POA: Diagnosis present

## 2021-10-12 DIAGNOSIS — I69354 Hemiplegia and hemiparesis following cerebral infarction affecting left non-dominant side: Secondary | ICD-10-CM | POA: Diagnosis not present

## 2021-10-12 DIAGNOSIS — N12 Tubulo-interstitial nephritis, not specified as acute or chronic: Secondary | ICD-10-CM

## 2021-10-12 DIAGNOSIS — F482 Pseudobulbar affect: Secondary | ICD-10-CM | POA: Diagnosis present

## 2021-10-12 DIAGNOSIS — N390 Urinary tract infection, site not specified: Secondary | ICD-10-CM | POA: Diagnosis not present

## 2021-10-12 DIAGNOSIS — I130 Hypertensive heart and chronic kidney disease with heart failure and stage 1 through stage 4 chronic kidney disease, or unspecified chronic kidney disease: Secondary | ICD-10-CM | POA: Diagnosis present

## 2021-10-12 DIAGNOSIS — I7121 Aneurysm of the ascending aorta, without rupture: Secondary | ICD-10-CM | POA: Diagnosis present

## 2021-10-12 DIAGNOSIS — R319 Hematuria, unspecified: Secondary | ICD-10-CM | POA: Diagnosis not present

## 2021-10-12 DIAGNOSIS — R490 Dysphonia: Secondary | ICD-10-CM

## 2021-10-12 DIAGNOSIS — Z87891 Personal history of nicotine dependence: Secondary | ICD-10-CM

## 2021-10-12 DIAGNOSIS — Z8249 Family history of ischemic heart disease and other diseases of the circulatory system: Secondary | ICD-10-CM

## 2021-10-12 DIAGNOSIS — E785 Hyperlipidemia, unspecified: Secondary | ICD-10-CM | POA: Diagnosis present

## 2021-10-12 DIAGNOSIS — R6520 Severe sepsis without septic shock: Secondary | ICD-10-CM | POA: Diagnosis present

## 2021-10-12 LAB — PROTIME-INR
INR: 1.2 (ref 0.8–1.2)
Prothrombin Time: 15.4 seconds — ABNORMAL HIGH (ref 11.4–15.2)

## 2021-10-12 LAB — LACTIC ACID, PLASMA
Lactic Acid, Venous: 3.2 mmol/L (ref 0.5–1.9)
Lactic Acid, Venous: 2.6 mmol/L (ref 0.5–1.9)

## 2021-10-12 LAB — CBC WITH DIFFERENTIAL/PLATELET
Abs Immature Granulocytes: 0.22 10*3/uL — ABNORMAL HIGH (ref 0.00–0.07)
Basophils Absolute: 0.1 10*3/uL (ref 0.0–0.1)
Basophils Relative: 0 %
Eosinophils Absolute: 0 10*3/uL (ref 0.0–0.5)
Eosinophils Relative: 0 %
HCT: 45 % (ref 39.0–52.0)
Hemoglobin: 15.1 g/dL (ref 13.0–17.0)
Immature Granulocytes: 1 %
Lymphocytes Relative: 3 %
Lymphs Abs: 0.7 10*3/uL (ref 0.7–4.0)
MCH: 29.4 pg (ref 26.0–34.0)
MCHC: 33.6 g/dL (ref 30.0–36.0)
MCV: 87.7 fL (ref 80.0–100.0)
Monocytes Absolute: 0.8 10*3/uL (ref 0.1–1.0)
Monocytes Relative: 3 %
Neutro Abs: 23.6 10*3/uL — ABNORMAL HIGH (ref 1.7–7.7)
Neutrophils Relative %: 93 %
Platelets: UNDETERMINED 10*3/uL (ref 150–400)
RBC: 5.13 MIL/uL (ref 4.22–5.81)
RDW: 14.6 % (ref 11.5–15.5)
WBC: 25.4 10*3/uL — ABNORMAL HIGH (ref 4.0–10.5)
nRBC: 0 % (ref 0.0–0.2)

## 2021-10-12 LAB — COMPREHENSIVE METABOLIC PANEL
ALT: 58 U/L — ABNORMAL HIGH (ref 0–44)
AST: 57 U/L — ABNORMAL HIGH (ref 15–41)
Albumin: 2.7 g/dL — ABNORMAL LOW (ref 3.5–5.0)
Alkaline Phosphatase: 117 U/L (ref 38–126)
Anion gap: 10 (ref 5–15)
BUN: 38 mg/dL — ABNORMAL HIGH (ref 8–23)
CO2: 19 mmol/L — ABNORMAL LOW (ref 22–32)
Calcium: 8.8 mg/dL — ABNORMAL LOW (ref 8.9–10.3)
Chloride: 111 mmol/L (ref 98–111)
Creatinine, Ser: 1.22 mg/dL (ref 0.61–1.24)
GFR, Estimated: >60 mL/min (ref >60)
Glucose, Bld: 142 mg/dL — ABNORMAL HIGH (ref 70–99)
Potassium: 3.4 mmol/L — ABNORMAL LOW (ref 3.5–5.1)
Sodium: 140 mmol/L (ref 135–145)
Total Bilirubin: 1.4 mg/dL — ABNORMAL HIGH (ref 0.3–1.2)
Total Protein: 6.4 g/dL — ABNORMAL LOW (ref 6.5–8.1)

## 2021-10-12 LAB — URINALYSIS, ROUTINE W REFLEX MICROSCOPIC
Bilirubin Urine: NEGATIVE
Glucose, UA: NEGATIVE mg/dL
Ketones, ur: NEGATIVE mg/dL
Nitrite: POSITIVE — AB
Protein, ur: 30 mg/dL — AB
Specific Gravity, Urine: 1.024 (ref 1.005–1.030)
WBC, UA: >50 WBC/hpf — ABNORMAL HIGH (ref 0–5)
pH: 6 (ref 5.0–8.0)

## 2021-10-12 LAB — RESP PANEL BY RT-PCR (FLU A&B, COVID) ARPGX2
Influenza A by PCR: NEGATIVE
Influenza B by PCR: NEGATIVE
SARS Coronavirus 2 by RT PCR: NEGATIVE

## 2021-10-12 LAB — MAGNESIUM: Magnesium: 1.5 mg/dL — ABNORMAL LOW (ref 1.7–2.4)

## 2021-10-12 MED ORDER — SODIUM CHLORIDE 0.9 % IV SOLN: 2.0000 g | Freq: Once | INTRAVENOUS | Status: DC

## 2021-10-12 MED ORDER — LACTATED RINGERS IV BOLUS
1000.0000 mL | Freq: Once | INTRAVENOUS | Status: AC
  Administered 2021-10-12: 1000 mL via INTRAVENOUS

## 2021-10-12 MED ORDER — METRONIDAZOLE 500 MG/100ML IV SOLN
500.0000 mg | Freq: Once | INTRAVENOUS | Status: AC
  Administered 2021-10-12: 500 mg via INTRAVENOUS
  Filled 2021-10-12: qty 100

## 2021-10-12 MED ORDER — BACLOFEN 10 MG PO TABS
20.0000 mg | ORAL_TABLET | Freq: Three times a day (TID) | ORAL | Status: DC
  Administered 2021-10-12 – 2021-10-18 (×19): 20 mg via ORAL
  Filled 2021-10-12 (×13): qty 2
  Filled 2021-10-12: qty 1
  Filled 2021-10-12 (×5): qty 2

## 2021-10-12 MED ORDER — VANCOMYCIN HCL 1750 MG/350ML IV SOLN
1750.0000 mg | Freq: Once | INTRAVENOUS | Status: AC
  Administered 2021-10-12: 1750 mg via INTRAVENOUS
  Filled 2021-10-12: qty 350

## 2021-10-12 MED ORDER — SODIUM CHLORIDE 0.9 % IV SOLN
2.0000 g | Freq: Two times a day (BID) | INTRAVENOUS | Status: DC
  Administered 2021-10-12 – 2021-10-13 (×2): 2 g via INTRAVENOUS
  Filled 2021-10-12 (×2): qty 12.5

## 2021-10-12 MED ORDER — ACETAMINOPHEN 325 MG PO TABS: 650.0000 mg | ORAL_TABLET | Freq: Four times a day (QID) | ORAL | Status: DC | PRN

## 2021-10-12 MED ORDER — IOHEXOL 300 MG/ML  SOLN
75.0000 mL | Freq: Once | INTRAMUSCULAR | Status: AC | PRN
  Administered 2021-10-12: 75 mL via INTRAVENOUS

## 2021-10-12 MED ORDER — VANCOMYCIN HCL IN DEXTROSE 1-5 GM/200ML-% IV SOLN: 1000.0000 mg | Freq: Once | INTRAVENOUS | Status: DC

## 2021-10-12 MED ORDER — GABAPENTIN 100 MG PO CAPS
100.0000 mg | ORAL_CAPSULE | Freq: Three times a day (TID) | ORAL | Status: DC
  Administered 2021-10-12 – 2021-10-18 (×19): 100 mg via ORAL
  Filled 2021-10-12 (×20): qty 1

## 2021-10-12 MED ORDER — TRAMADOL HCL 50 MG PO TABS: 50.0000 mg | ORAL_TABLET | Freq: Two times a day (BID) | ORAL | Status: DC

## 2021-10-12 MED ORDER — MAGNESIUM SULFATE 2 GM/50ML IV SOLN
2.0000 g | Freq: Once | INTRAVENOUS | Status: AC
  Administered 2021-10-12: 2 g via INTRAVENOUS
  Filled 2021-10-12: qty 50

## 2021-10-12 MED ORDER — LACTATED RINGERS IV BOLUS
500.0000 mL | Freq: Once | INTRAVENOUS | Status: AC
Start: 2021-10-12 — End: 2021-10-12
  Administered 2021-10-12: 500 mL via INTRAVENOUS

## 2021-10-12 MED ORDER — LORAZEPAM 0.5 MG PO TABS: 0.5000 mg | ORAL_TABLET | Freq: Two times a day (BID) | ORAL | Status: DC | PRN

## 2021-10-12 MED ORDER — ENOXAPARIN SODIUM 40 MG/0.4ML IJ SOSY
40.0000 mg | PREFILLED_SYRINGE | INTRAMUSCULAR | Status: DC
  Administered 2021-10-12 – 2021-10-18 (×7): 40 mg via SUBCUTANEOUS
  Filled 2021-10-12 (×7): qty 0.4

## 2021-10-12 MED ORDER — ACETAMINOPHEN 650 MG RE SUPP: 650.0000 mg | Freq: Four times a day (QID) | RECTAL | Status: DC | PRN

## 2021-10-12 MED ORDER — LACTATED RINGERS IV SOLN: INTRAVENOUS | Status: AC

## 2021-10-12 MED ORDER — VANCOMYCIN HCL 1250 MG/250ML IV SOLN
1250.0000 mg | INTRAVENOUS | Status: DC
  Filled 2021-10-12: qty 250

## 2021-10-12 MED ORDER — TRAMADOL HCL 50 MG PO TABS
50.0000 mg | ORAL_TABLET | ORAL | Status: DC
  Administered 2021-10-12: 50 mg via ORAL
  Filled 2021-10-12: qty 1

## 2021-10-12 NOTE — H&P (Cosign Needed)
[facility] Admission History and Physical Service Pager: [phone]  Patient name: Gregory Kerr Medical record number: [id number] Date of birth: [birth year] Age: 81 y.o. Gender: male  Primary Care Provider: Virgel Bouquet, MD Consultants: N/A Code Status: Full  Preferred Emergency Contact: Lennie Muckle (sister): [phone]  Chief Complaint: "Feeling weird"  Assessment and Plan: SMITH POTENZA is a 81 y.o. male presenting with 2 days of decreased appetite, difficulty speaking, disorientation, lethargy, and difficulty moving found to be septic on admission. 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  Urosepsis Leukocytosis Patient with decreased appetite, feeling disoriented, difficulty moving and speaking, and overall feeling "weird" for the past 2 days.  Reports similar symptomology when he has had recurrent cystitis in the past.  Patient afebrile, and largely normotensive.  CTA head negative for acute ischemic or hemorrhagic changes.  CT soft tissue neck with ascending thoracic aorta aneurysm but no changes of recent pathology to affect his pharynx or larynx. WBC 25.4.  UA significant for nitrites, moderate leukocytes, greater than 50 white blood cells, and many bacteria.  Lactic acid 3.2 on admission, and repeat 2.6 s/p 2.5 L fluid resuscitation.  Physical exam significant for diaphoresis and suprapubic tenderness to palpation.  Urine in catheter bag with reddish hue.  Likely diagnosis urosepsis 2/2 acute cystitis, less likely 2/2 pyelonephritis. - admit to [facility] attending, Dr. Ardelia Mems. - Continue broad-spectrum antibiotics initiated in ED: IV vancomycin 1.25 g every 24 hours and IV cefepime 2 g twice daily. - Continue maintenance fluids: LR 150 mL/h - Follow-up urine culture - Follow-up blood cultures  Mildly elevated Liver Enzymes AST  57, ALT 58.  Elevated since admission 08/10/2021, at which time AST 57, ALT 74.  Patient denies abdominal pain, no hepatomegaly appreciated on exam.  Unknown etiology. - Continue to monitor - Advised outpatient work-up for source.  Chronic Urinary Incontinence Bladder spasms, chronic Follows with [facility].  Recent admission 5/20 in which he was admitted for suprapubic catheter dislodging.  Multiple admissions since January for similar chief complaint.  Patient advised that he did not want catheter replaced and would rather remain incontinent.  Patient denies incomplete bladder emptying since removal of catheter.  Bladder scan in ED with 58 mL.  Condom catheter in place.  Home medication: Tamsulosin 0.4 mg nightly. - Resume condom catheter use - Strict I's and O's  Muscle and spasms, chronic Chronic Pain -Resume home baclofen 20 mg 3 times daily, gabapentin 100 mg 3 times daily, and tramadol 50 mg twice daily  HTN BP has ranged from 103-143/51-105 Home medication: Lisinopril which was held after last hospitalization  -Continue to monitor  HLD Chronic, stable.  -Resume home atorvastatin when patient passes swallow screen  Hx of HFpEF Last echo July 2022 with LVEF 60 to 65%, no regional wall abnormalities, G2 DD, and aortic dilatation. 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 - Resume home Plavix when patient passes swallow screen.  Aortic aneurysm  CT neck performed today which showed ascending thoracic aorta is aneurysmal and measures up to 4.3 cm in diameter. Atherosclerotic plaque also present within the proximal major branch vessels of the neck and carotid arteries. -Recommend annual imaging followup by CTA or MRA.   FEN/GI: Pending SLP evaluation Prophylaxis: SQ Lovenox  Disposition: Admit to MedSurg  History  of Present Illness:  Gregory Kerr is a 81 y.o. male  presenting with voice changes.   Patient states he felt disoriented this morning and could not think clearly, eat, nor move very well causing significant discomfort; overall he felt "weird."  The loss of appetite began yesterday where patient would chew his food indefinitely but would not swallow.  Additionally, he states he lost his voice and could only whisper.  He reports feeling this way when he had a bladder infection in the past.  He endorses burning pain with urination but is unable to say exactly when it started; it is unclear as to whether this was before or after removal of the catheter.  He does endorse improvement in suprapubic pain since catheter was removed last hospitalization.  He denies urinary urge or frequency.  As well, he feels it was a relief to have catheter removed because it kept dislodging (multiple ED visits this year for this reason).  Denies trouble breathing, fevers, chills, abdominal pain. Has endorsed bladder spasms. Has BMs every other day unsure of last BM. Denies diarrhea, constipation, nausea, emesis.   Review Of Systems: Per HPI with the following additions:   Review of Systems  Constitutional:  Positive for appetite change and fatigue. Negative for chills, diaphoresis and fever.  HENT:  Positive for voice change. Negative for trouble swallowing.   Respiratory:  Negative for chest tightness and shortness of breath.   Cardiovascular:  Negative for chest pain.  Gastrointestinal:  Positive for abdominal pain. Negative for constipation, diarrhea, nausea and vomiting.  Genitourinary:  Negative for decreased urine volume, difficulty urinating, dysuria, frequency, hematuria and urgency.  Musculoskeletal:  Positive for myalgias. Negative for back pain.       Muscle spasms  Neurological:  Positive for facial asymmetry, speech difficulty, weakness and headaches. Negative for dizziness, tremors, seizures and syncope.       Chronic changes from previous strokes     Patient Active Problem List   Diagnosis Date Noted   Acute renal failure superimposed on stage 3a chronic kidney disease ([place]) 06/29/2021   Bladder spasm 06/29/2021   Sepsis ([place]) 06/28/2021   Spasticity 06/28/2021   Prolonged QT interval 06/28/2021   Constipation 06/28/2021   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 03/08/2021   Weakness    Acute CVA (cerebrovascular accident) ([place]) 11/19/2020   Allergic rhinitis due to allergen 04/27/2016   Chest pain 02/27/2016   Thoracic ascending aortic aneurysm ([place]) 02/27/2016   Leukocytosis 02/27/2016   Major depression, chronic 02/16/2016   Neuropathic pain 02/16/2016   D (diarrhea) 12/29/2015   Coronary artery disease involving native coronary artery of native heart with angina pectoris ([place]) 11/12/2015   History of CVA with residual deficit 10/14/2015   Hyperlipidemia 07/16/2015   Hypertensive heart disease with congestive heart failure ([place]) 12/10/2014   Angina pectoris ([place]) 12/10/2014   Chronic obstructive pulmonary disease ([place]) 12/10/2014   Irritable bowel syndrome with diarrhea 12/10/2014   Senile osteoporosis 12/10/2014   CVA (cerebral vascular accident) ([place]) 12/10/2014   Acute on chronic diastolic CHF (congestive heart failure) ([place]) 09/27/2014   Hypokalemia 09/23/2014   Pancreatitis, gallstone 09/22/2014   Chronic diastolic heart failure ([place]) 09/22/2014   Acute cholangitis 09/22/2014   Gallstone pancreatitis 09/22/2014   Hiatal hernia with GERD 08/31/2014   Vitamin D deficiency 08/03/2014   Hyperlipidemia LDL goal <100 08/03/2014   CVA, old, hemiparesis ([place]) 08/03/2014   Leg edema, left 08/03/2014   Gastroesophageal reflux disease without esophagitis  06/18/2014   Essential hypertension, benign 06/18/2014   Primary generalized hypertrophic osteoarthrosis 06/18/2014   Left spastic hemiplegia ([place]) 06/18/2014   UTI (urinary tract infection) 04/04/2014   Essential hypertension 04/03/2014   Chronic airway  obstruction, not elsewhere classified 01/15/2014   Dermatophytosis of nail 12/18/2013   Unspecified essential hypertension 12/18/2013   Generalized osteoarthrosis, unspecified site 12/18/2013   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   GERD (gastroesophageal reflux disease) 08/11/2013   Disuse osteoporosis 08/11/2013   Chronic pain syndrome 01/30/2013   IBS (irritable bowel syndrome) 01/30/2013   Cerebral infarct ([name]) 10/24/2012   HTN (hypertension) 10/24/2012   Osteoporosis 10/24/2012   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   Other and unspecified hyperlipidemia 10/24/2012   Hiatal hernia 10/24/2012    Past Medical History: 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   Pneumonia    "once or twice" (09/22/2014)   Retinal detachment    left eye   Stroke [facility]) 2007   "left side doesn't work now" (09/22/2014)    Past Surgical History: Past Surgical History:  Procedure Laterality Date   APPENDECTOMY  05/27/2009   Ronnald Collum, MD   CATARACT EXTRACTION W/ INTRAOCULAR LENS  IMPLANT, BILATERAL Bilateral ?2014   CHOLECYSTECTOMY N/A 09/26/2014   Procedure: LAPAROSCOPIC CHOLECYSTECTOMY WITH INTRAOPERATIVE CHOLANGIOGRAM;  Surgeon: Donnie Mesa, MD;  Location: [facility];  Service: General;  Laterality: N/A;   COLONOSCOPY WITH PROPOFOL N/A 12/29/2015   Procedure: COLONOSCOPY WITH PROPOFOL;  Surgeon: Wilford Corner, MD;  Location: [facility] ENDOSCOPY;  Service: Endoscopy;  Laterality: N/A;   ERCP N/A 09/25/2014   Procedure: ENDOSCOPIC RETROGRADE CHOLANGIOPANCREATOGRAPHY (ERCP);  Surgeon: Clarene Essex, MD;  Location: [facility] ENDOSCOPY;  Service: Endoscopy;  Laterality: N/A;   IR CATHETER TUBE CHANGE   05/24/2021   PARS PLANA VITRECTOMY Left 01/30/2019   Procedure: PARS PLANA VITRECTOMY WITH 25 GAUGE;  Surgeon: Sherlynn Stalls, MD;  Location: [facility];  Service: Ophthalmology;  Laterality: Left;   PARS PLANA VITRECTOMY Right 11/13/2019   Procedure: PARS PLANA VITRECTOMY WITH 25 GAUGE WITH REMOVAL OF POSTERIOR CAPSULAR OPACIFICATION WITH VITRECTOR;  Surgeon: Sherlynn Stalls, MD;  Location: [facility];  Service: Ophthalmology;  Laterality: Right;   PHOTOCOAGULATION WITH LASER Left 01/30/2019   Procedure: PHOTOCOAGULATION WITH LASER;  Surgeon: Sherlynn Stalls, MD;  Location: [facility];  Service: Ophthalmology;  Laterality: Left;    Social History: Social History   Tobacco Use   Smoking status: Former    Packs/day: 1.00    Types: Cigarettes    Quit date: 12/20/2005    Years since quitting: 15.8   Smokeless tobacco: Never  Vaping Use   Vaping Use: Never used  Substance Use Topics   Alcohol use: Not Currently    Comment: 09/22/2014 "stopped in 2007; drank occasionally when I did drink"   Drug use: No   Additional social history:  Lives at [facility] place  Please also refer to relevant sections of EMR.  Family History: Family History  Problem Relation Age of Onset   Heart attack Mother    Heart attack Father    Cancer Maternal Aunt        Stomach   Cancer Paternal Aunt  Uterine,Colon    Allergies and Medications: Allergies  Allergen Reactions   Penicillins Rash    ++++Patient in nursing home. Unable to clarify details/severity of the reaction.++++   No current facility-administered medications on file prior to encounter.   Current Outpatient Medications on File Prior to Encounter  Medication Sig Dispense Refill   atorvastatin (LIPITOR) 40 MG tablet Take 1 tablet (40 mg total) by mouth daily. (Patient taking differently: Take 40 mg by mouth every evening.) 30 tablet 0   baclofen (LIORESAL) 20 MG tablet Take 20 mg by mouth 3 (three) times daily.     bisacodyl (DULCOLAX) 10 MG suppository  Place 10 mg rectally daily as needed for moderate constipation.     calcium-vitamin D (OSCAL WITH D) 500-200 MG-UNIT tablet Take 1 tablet by mouth 2 (two) times daily.     clopidogrel (PLAVIX) 75 MG tablet Take 1 tablet (75 mg total) by mouth daily. 30 tablet 0   Dextromethorphan-quiNIDine (NUEDEXTA) 20-10 MG capsule Take 1 capsule by mouth daily. For Pseudobulbar Affect (PBA)     diclofenac Sodium (VOLTAREN) 1 % GEL Apply 2 g topically in the morning, at noon, and at bedtime. To right shoulder     gabapentin (NEURONTIN) 100 MG capsule Take 100 mg by mouth 3 (three) times daily.     guaiFENesin (MUCINEX) 600 MG 12 hr tablet Take 600 mg by mouth 2 (two) times daily.     hydroxypropyl methylcellulose / hypromellose (ISOPTO TEARS / GONIOVISC) 2.5 % ophthalmic solution Place 1 drop into both eyes 3 (three) times daily.     loperamide (IMODIUM A-D) 2 MG tablet Take 4 mg by mouth 4 (four) times daily as needed for diarrhea or loose stools.     loratadine (CLARITIN) 10 MG tablet Take 10 mg by mouth daily.     LORazepam (ATIVAN) 0.5 MG tablet Take 0.5 mg by mouth 2 (two) times daily as needed for anxiety.     Miconazole-Zinc Oxide-Petrolat 0.25-15-81.35 % OINT Apply 1 application topically every 4 (four) hours. To penis and groin area     mupirocin ointment (BACTROBAN) 2 % Apply 1 application topically 2 (two) times daily. Suprapubic site     nitroGLYCERIN (NITROSTAT) 0.4 MG SL tablet Place 0.4 mg under the tongue every 5 (five) minutes as needed for chest pain.     Nutritional Supplements (BOOST PO) Take 1 Container by mouth daily. Lactose-Reduced 0..04 g/1 kcal/ml liquid     pantoprazole (PROTONIX) 20 MG tablet Take 20 mg by mouth daily.     phenazopyridine (PYRIDIUM) 100 MG tablet Take 100 mg by mouth every 8 (eight) hours as needed (bladder spasms).     polyethylene glycol (MIRALAX / GLYCOLAX) packet Take 17 g by mouth daily as needed (constipation).      Prenatal Vit-Fe Fumarate-FA (PREPLUS) 27-1 MG  TABS Take 1 tablet by mouth daily.      PROLIA 60 MG/ML SOSY injection Inject 60 mg into the skin every 6 (six) months.     sertraline (ZOLOFT) 100 MG tablet Take 100 mg by mouth daily.     solifenacin (VESICARE) 5 MG tablet Take 1 tablet (5 mg total) by mouth daily. 30 tablet 0   tamsulosin (FLOMAX) 0.4 MG CAPS capsule Take 1 capsule (0.4 mg total) by mouth at bedtime. (Patient not taking: Reported on 06/28/2021) 30 capsule 0   traMADol (ULTRAM) 50 MG tablet Take 50 mg by mouth See admin instructions. Take 50 mg twice daily scheduled, may take a 3rd 50  mg dose as needed for pain     Vitamins A & D (VITAMIN A & D) ointment Apply 1 application topically in the morning, at noon, in the evening, and at bedtime. To abdominal folds, penis and scrotum along with nystatin      Objective: BP 116/64   Pulse 97   Temp 97.6 F (36.4 C) (Rectal)   Resp 14   Ht '5\' 9"'$  (1.753 m)   Wt 74.8 kg   SpO2 (!) 89%   BMI 24.35 kg/m  Exam: General: Chronically ill-appearing male, diaphoretic, but in no acute distress. Eyes: EOMI. ENTM: Dry mucous membranes. Cardiovascular: RRR, no M/R/G Respiratory: CTAB anteriorly; normal WOB on room air. Gastrointestinal: Soft, nondistended; mild to moderate suprapubic tenderness to palpation MSK: Contractures of left upper extremity and lower extremity Derm: Warm and dry Neuro:  Mental Status: Patient is awake, alert, oriented to person, place, and situation but not year (states it is 2003) Patient is able to give a clear and coherent history.  He does repeat his history multiple times. Speech soft, voice less hoarse and with no signs of aphasia; patient reports he is back to baseline Cranial Nerves: III,IV, VI: EOMI without ptosis or diploplia.  VII: Facial movement is symmetric resting but with slight left sided facial droop smiling VIII: Hearing is intact to voice X: Palate elevates symmetrically XI: Shoulder shrug is decreased on L shoulder, normal on R XII:  Tongue protrudes midline without atrophy or fasciculations.  Motor: Contractures of LUE and L foot. Bulk is decreased in LUE.  0/5 strength of LUE, LLE, and RLE.  3/5 strength RUE Psych: Normal mood and affect  Labs and Imaging: CBC BMET  Recent Labs  Lab 10/12/21 1213  WBC 25.4*  HGB 15.1  HCT 45.0  PLT PLATELET CLUMPS NOTED ON SMEAR, UNABLE TO ESTIMATE   Recent Labs  Lab 10/12/21 1213  NA 140  K 3.4*  CL 111  CO2 19*  BUN 38*  CREATININE 1.22  GLUCOSE 142*  CALCIUM 8.8*     EKG: Normal sinus rhythm  EXAM: CT HEAD WITHOUT CONTRAST IMPRESSION: 1. No evidence of acute intracranial abnormality. 2. Known chronic small-vessel infarcts within the bilateral cerebral hemispheric white matter, right thalamus and within the pons. 3. Background moderate cerebral white matter chronic small vessel ischemic disease. 4. Mild generalized cerebral atrophy. 5. Callosal dysgenesis. 6. Paranasal sinus disease, as described.  EXAM: CT NECK WITH CONTRAST IMPRESSION: 1. Motion degraded exam. 2. No appreciable swelling or mass within the oral cavity, pharynx or larynx. 3. Paranasal sinus disease, as described. 4. 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 Recommend annual imaging followup by CTA or MRA. This recommendation follows 2010 ACCF/AHA/AATS/ACR/ASA/SCA/SCAI/SIR/STS/SVM Guidelines for the Diagnosis and Management of Patients with Thoracic Aortic Disease. Circulation. 2010; 121: Q657-Q469. Aortic aneurysm NOS (ICD10-I71.9). 5. Aortic Atherosclerosis (ICD10-I70.0). Atherosclerotic plaque also present within the proximal major branch vessels of the neck and carotid arteries. 6. Cervical spondylosis, as described. 7. Incompletely imaged thoracic levocurvature.   Rosezetta Schlatter, MD 10/12/2021, 8:11 PM PGY-1, [place] Intern pager: [phone], text pages welcome    FPTS Upper-Level Resident  Addendum   I have independently interviewed and examined the patient. I have discussed the above with the original author and agree with their documentation. My edits for correction/addition/clarification are included. Please see also any attending notes.   Sonia Side, D.O. PGY-2, [facility] 10/13/2021 2:05 AM  [place] Service pager: [phone] (text pages welcome through [facility])

## 2021-10-12 NOTE — ED Provider Notes (Addendum)
  Physical Exam  BP 120/60   Pulse 89   Temp 98 F (36.7 C) (Oral)   Resp 18   Ht '5\' 9"'$  (1.753 m)   Wt 74.8 kg   SpO2 90%   BMI 24.35 kg/m     Procedures  .Critical Care Performed by: Regan Lemming, MD Authorized by: Regan Lemming, MD   Critical care provider statement:    Critical care time (minutes):  30   Critical care was necessary to treat or prevent imminent or life-threatening deterioration of the following conditions:  Sepsis   Critical care was time spent personally by me on the following activities:  Development of treatment plan with patient or surrogate, discussions with consultants, evaluation of patient's response to treatment, examination of patient, ordering and review of laboratory studies, ordering and review of radiographic studies, ordering and performing treatments and interventions, pulse oximetry, re-evaluation of patient's condition and review of old charts   Care discussed with: admitting provider    ED Course / MDM   Clinical Course as of 10/12/21 2329  Wed Oct 12, 2021  1755 Nitrite(!): POSITIVE [JL]  1755 Chalmers Guest): MODERATE [JL]  1755 WBC, UA(!): >50 [JL]  1755 Bacteria, UA(!): MANY [JL]  1755 Magnesium(!): 1.5 [JL]    Clinical Course User Index [JL] Regan Lemming, MD   Medical Decision Making Amount and/or Complexity of Data Reviewed Labs: ordered. Decision-making details documented in ED Course. Radiology: ordered.  Risk Prescription drug management. Decision regarding hospitalization.   61M presenting to the ED with speech difficulties. Hx of multiple strokes in the past. CT imaging pending. Labs concerning for leukocytosis and lactic acidosis, concern for dehydration. Getting ABX and IVF. ? Genitourinary source of infection. Follow up urine, imaging, and reassess. Likely admission for AKI.  Patient urinalysis concerning for urinary tract infection.  With positive results, concern for sepsis with lactic acidosis, elevated  heart rate, leukocytosis.  Code sepsis initiated.  The patient is status post IV fluid resuscitation and antibiotics.  His lactic acid was notably downtrending following fluid resuscitation.  He remained hemodynamically stable.  General medicine consulted for admission for urosepsis and AKI with likely pyelonephritis.  Family medicine was consulted for admission and accepted the patient admission for further evaluation and treatment.     Regan Lemming, MD 10/12/21 4010    Regan Lemming, MD 10/12/21 [phone]

## 2021-10-12 NOTE — Progress Notes (Signed)
Pharmacy Antibiotic Note  Gregory Kerr is a 81 y.o. male admitted on 10/12/2021 with concerns for  infection of unknown source .  Patient is afebrile, WBC 25.4. Patient with noted allergy to penicillins (rash), but has previously received Cefepime (February 2023). Pharmacy has been consulted for Vancomycin and Cefepime dosing.  Plan: Vancomycin 1750 mg IV x 1, followed by 1250 mg IV q24h (eAUC 506.6, SCr 1.22, goal AUC 400-550) Cefepime 2 g IV q12h Metronidazole 500 mg IV x 1 per MD  Follow-up clinical status, renal function Follow-up cultures, LOT, de-escalate as able Obtain Vancomycin levels as appropriate   Height: '5\' 9"'$  (175.3 cm) Weight: 74.8 kg (164 lb 14.5 oz) IBW/kg (Calculated) : 70.7  Temp (24hrs), Avg:98 F (36.7 C), Min:98 F (36.7 C), Max:98 F (36.7 C)  Recent Labs  Lab 10/07/21 2355 10/08/21 0025 10/12/21 1213 10/12/21 1342  WBC 6.4  --  25.4*  --   CREATININE  --  0.50* 1.22  --   LATICACIDVEN  --   --   --  3.2*    Estimated Creatinine Clearance: 49.9 mL/min (by C-G formula based on SCr of 1.22 mg/dL).    Allergies  Allergen Reactions   Penicillins Rash    ++++Patient in nursing home. Unable to clarify details/severity of the reaction.++++    Antimicrobials this admission: Vancomycin 5/24 >>  Cefepime 5/24 >>  Metronidazole 5/24 x 1  Microbiology results: 5/24 BCx: pending 5/24 UCx: pending    Thank you for allowing pharmacy to be a part of this patient's care.  Vance Peper, PharmD PGY1 Pharmacy Resident 10/12/2021 3:20 PM   Please check AMION for all [place] phone numbers After 10:00 PM, call [place] [phone]

## 2021-10-12 NOTE — ED Notes (Signed)
Pt called RN and states he feels like his whole body is hurting and his muscles tightening. No rash or airway compromise noted. Vancomycin started recently and is now paused. Will notify MD.

## 2021-10-12 NOTE — ED Triage Notes (Signed)
LKN at 2100 last night. Here for abnormal speech. Hx of multiple strokes in the past with bilateral side residual weakness. Alert and oriented x 4.

## 2021-10-12 NOTE — ED Provider Notes (Signed)
[facility] EMERGENCY DEPARTMENT Provider Note   CSN: [id number] Arrival date & time: 10/12/21  1141     History  Chief Complaint  Patient presents with   Aphasia    Gregory Kerr is a 81 y.o. male.  HPI Patient presents for voice change.  Onset was this morning upon awakening.  Last night he reports that he was in his normal state of health and able to phonate normally.  This morning, he has a hoarseness to his voice and difficulty speaking with volume.  He denies any throat discomfort.  He denies any respiratory symptoms.  He currently lives in a nursing facility.  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  At baseline, he has left hemibody contracture and weakness.  He has RLE weakness at baseline as well.  EMS reports normal blood glucose prior to arrival.  Patient has been alert and oriented during transit.  Patient does state that his oral mucosa feels dry but otherwise denies any difficulty swallowing.  Additional medical history includes HTN, HLD, chronic pain, IBS, GERD, diastolic HF, COPD, and depression.  Patient was most recently seen in the ED 5 days ago.  At that time, he had dislodged his suprapubic catheter.  This was a repeated occurrence for him.  Lumen appeared closed at that time and urology was consulted.  Urology evaluated the patient.  Patient refused any further attempts at suprapubic or Foley catheter placement.  He was, at that time, urinating without urinary retention.  He was discharged without any further catheter in place.    Home Medications Prior to Admission medications   Medication Sig Start Date End Date Taking? Authorizing Provider  atorvastatin (LIPITOR) 40 MG tablet Take 1 tablet (40 mg total) by mouth daily. Patient taking differently: Take 40 mg by mouth every evening. 11/22/20   Madalyn Rob, MD  baclofen (LIORESAL) 20 MG tablet Take 20 mg by mouth 3 (three) times daily.    [provider]  bisacodyl (DULCOLAX) 10  MG suppository Place 10 mg rectally daily as needed for moderate constipation.    [provider]  calcium-vitamin D (OSCAL WITH D) 500-200 MG-UNIT tablet Take 1 tablet by mouth 2 (two) times daily.    [provider]  clopidogrel (PLAVIX) 75 MG tablet Take 1 tablet (75 mg total) by mouth daily. 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 To right shoulder    [provider]  gabapentin (NEURONTIN) 100 MG capsule Take 100 mg by mouth 3 (three) times daily.    [provider]  guaiFENesin (MUCINEX) 600 MG 12 hr tablet Take 600 mg by mouth 2 (two) times daily.    [provider]  hydroxypropyl methylcellulose / hypromellose (ISOPTO TEARS / GONIOVISC) 2.5 % ophthalmic solution Place 1 drop into both eyes 3 (three) times daily.    [provider]  loperamide (IMODIUM A-D) 2 MG tablet Take 4 mg by mouth 4 (four) times daily as needed for diarrhea or loose stools.    [provider]  loratadine (CLARITIN) 10 MG tablet Take 10 mg by mouth daily.    [provider]  LORazepam (ATIVAN) 0.5 MG tablet Take 0.5 mg by mouth 2 (two) times daily as needed for anxiety.  [provider]  Miconazole-Zinc Oxide-Petrolat 0.25-15-81.35 % OINT Apply 1 application topically every 4 (four) hours. To penis and groin area    [provider]  mupirocin ointment (BACTROBAN) 2 % Apply 1 application topically 2 (two) times daily. Suprapubic site    [provider]  nitroGLYCERIN (NITROSTAT) 0.4 MG SL tablet Place 0.4 mg under the tongue every 5 (five) minutes as needed for chest pain.    [provider]  Nutritional Supplements (BOOST PO) Take 1 Container by mouth daily. Lactose-Reduced 0..04 g/1  kcal/ml liquid    [provider]  pantoprazole (PROTONIX) 20 MG tablet Take 20 mg by mouth daily. 11/17/20   [provider]  phenazopyridine (PYRIDIUM) 100 MG tablet Take 100 mg by mouth every 8 (eight) hours as needed (bladder spasms).    [provider]  polyethylene glycol (MIRALAX / GLYCOLAX) packet Take 17 g by mouth daily as needed (constipation).     [provider]  Prenatal Vit-Fe Fumarate-FA (PREPLUS) 27-1 MG TABS Take 1 tablet by mouth daily.     [provider]  PROLIA 60 MG/ML SOSY injection Inject 60 mg into the skin every 6 (six) months. 09/07/20   [provider]  sertraline (ZOLOFT) 100 MG tablet Take 100 mg by mouth daily.    [provider]  solifenacin (VESICARE) 5 MG tablet Take 1 tablet (5 mg total) by mouth daily. 11/21/20   Madalyn Rob, MD  tamsulosin (FLOMAX) 0.4 MG CAPS capsule Take 1 capsule (0.4 mg total) by mouth at bedtime. Patient not taking: Reported on 06/28/2021 11/21/20   Madalyn Rob, MD  traMADol (ULTRAM) 50 MG tablet Take 50 mg by mouth See admin instructions. Take 50 mg twice daily scheduled, may take a 3rd 50 mg dose as needed for pain    [provider]  Vitamins A & D (VITAMIN A & D) ointment Apply 1 application topically in the morning, at noon, in the evening, and at bedtime. To abdominal folds, penis and scrotum along with nystatin    [provider]      Allergies    Penicillins    Review of Systems   Review of Systems  HENT:  Positive for voice change.   All other systems reviewed and are negative.  Physical Exam Updated Vital Signs BP (!) 112/51   Pulse 85   Temp 97.6 F (36.4 C) (Rectal)   Resp 18   Ht '5\' 9"'$  (1.753 m)   Wt 74.8 kg   SpO2 93%   BMI 24.35 kg/m  Physical Exam Vitals and nursing note reviewed.  Constitutional:      General: He is not in acute distress.    Appearance: He is well-developed and normal weight. He is ill-appearing (Chronically). He  is not toxic-appearing or diaphoretic.  HENT:     Head: Normocephalic and atraumatic.     Right Ear: External ear normal.     Left Ear: External ear normal.     Nose: Nose normal.     Mouth/Throat:     Mouth: Mucous membranes are dry.     Pharynx: Oropharynx is clear. No oropharyngeal exudate or posterior oropharyngeal erythema.  Eyes:     General: No scleral icterus.    Extraocular Movements: Extraocular movements intact.     Conjunctiva/sclera: Conjunctivae normal.  Cardiovascular:     Rate and Rhythm: Normal rate and regular rhythm.     Heart sounds: No murmur heard. Pulmonary:     Effort: Pulmonary  effort is normal. No respiratory distress.     Breath sounds: Normal breath sounds. No wheezing or rales.  Chest:     Chest wall: No tenderness.  Abdominal:     Palpations: Abdomen is soft.     Tenderness: There is no abdominal tenderness.  Musculoskeletal:        General: Deformity (Left sided extremity contractures) present. No swelling.     Cervical back: Neck supple. No rigidity.     Right lower leg: No edema.     Left lower leg: No edema.  Skin:    General: Skin is warm and dry.     Capillary Refill: Capillary refill takes less than 2 seconds.     Coloration: Skin is not jaundiced or pale.  Neurological:     Mental Status: He is alert and oriented to person, place, and time.     Comments: Patient has slight facial asymmetry.  His voice is hoarse and low volume.  He has left hemibody weakness with limb contractures which she says is baseline.  He also states that RLE weakness is baseline.  R UE strength remains intact.  Psychiatric:        Mood and Affect: Mood normal.        Behavior: Behavior normal.        Thought Content: Thought content normal.        Judgment: Judgment normal.    ED Results / Procedures / Treatments   Labs (all labs ordered are listed, but only abnormal results are displayed) Labs Reviewed  PROTIME-INR - Abnormal; Notable for the following  components:      Result Value   Prothrombin Time 15.4 (*)    All other components within normal limits  COMPREHENSIVE METABOLIC PANEL - Abnormal; Notable for the following components:   Potassium 3.4 (*)    CO2 19 (*)    Glucose, Bld 142 (*)    BUN 38 (*)    Calcium 8.8 (*)    Total Protein 6.4 (*)    Albumin 2.7 (*)    AST 57 (*)    ALT 58 (*)    Total Bilirubin 1.4 (*)    All other components within normal limits  URINALYSIS, ROUTINE W REFLEX MICROSCOPIC - Abnormal; Notable for the following components:   Color, Urine AMBER (*)    APPearance CLOUDY (*)    Hgb urine dipstick SMALL (*)    Protein, ur 30 (*)    Nitrite POSITIVE (*)    Leukocytes,Ua MODERATE (*)    WBC, UA >50 (*)    Bacteria, UA MANY (*)    All other components within normal limits  CBC WITH DIFFERENTIAL/PLATELET - Abnormal; Notable for the following components:   WBC 25.4 (*)    Neutro Abs 23.6 (*)    Abs Immature Granulocytes 0.22 (*)    All other components within normal limits  MAGNESIUM - Abnormal; Notable for the following components:   Magnesium 1.5 (*)    All other components within normal limits  LACTIC ACID, PLASMA - Abnormal; Notable for the following components:   Lactic Acid, Venous 3.2 (*)    All other components within normal limits  RESP PANEL BY RT-PCR (FLU A&B, COVID) ARPGX2  CULTURE, BLOOD (ROUTINE X 2)  CULTURE, BLOOD (ROUTINE X 2)  URINE CULTURE  LACTIC ACID, PLASMA    EKG EKG Interpretation  Date/Time:  Wednesday Oct 12 2021 11:53:33 EDT Ventricular Rate:  92 PR Interval:  138 QRS Duration: 121 QT  Interval:  371 QTC Calculation: 459 R Axis:   16 Text Interpretation: Sinus rhythm Nonspecific intraventricular conduction delay Confirmed by Godfrey Pick [phone]) on 10/12/2021 12:21:36 PM  Radiology CT HEAD WO CONTRAST  Result Date: 10/12/2021 CLINICAL DATA:  Provided history: Neuro deficit, acute, stroke suspected. Additional history provided: Abnormal speech, history of multiple  prior strokes, residual bilateral weakness. EXAM: CT HEAD WITHOUT CONTRAST TECHNIQUE: Contiguous axial images were obtained from the base of the skull through the vertex without intravenous contrast. RADIATION DOSE REDUCTION: This exam was performed according to the departmental dose-optimization program which includes automated exposure control, adjustment of the mA and/or kV according to patient size and/or use of iterative reconstruction technique. COMPARISON:  CT angiogram head/neck 11/19/2020. Brain MRI 12/18/2020. FINDINGS: Brain: Mild generalized cerebral atrophy. Known chronic small-vessel infarcts within the bilateral cerebral hemispheric white matter, right thalamus and within the pons. Background moderate patchy and ill-defined hypoattenuation within the cerebral white matter, nonspecific but compatible with chronic small vessel ischemic disease. Callosal dysgenesis. There is no acute intracranial hemorrhage. No demarcated cortical infarct. No extra-axial fluid collection. No evidence of an intracranial mass. No midline shift. Vascular: No hyperdense vessel. Skull: No fracture or aggressive osseous lesion. Sinuses/Orbits: No mass or acute finding within the imaged orbits. 2.5 cm mucous retention cyst within the left maxillary sinus. Minimal mucosal thickening within the bilateral ethmoid sinuses. IMPRESSION: 1. No evidence of acute intracranial abnormality. 2. Known chronic small-vessel infarcts within the bilateral cerebral hemispheric white matter, right thalamus and within the pons. 3. Background moderate cerebral white matter chronic small vessel ischemic disease. 4. Mild generalized cerebral atrophy. 5. Callosal dysgenesis. 6. Paranasal sinus disease, as described. Electronically Signed   By: Kellie Simmering D.O.   On: 10/12/2021 15:36   CT SOFT TISSUE NECK W CONTRAST  Result Date: 10/12/2021 CLINICAL DATA:  Hoarseness, normal laryngeal exam. Additional history provided: Abnormal speech. EXAM: CT  NECK WITH CONTRAST TECHNIQUE: Multidetector CT imaging of the neck was performed using the standard protocol following the bolus administration of intravenous contrast. RADIATION DOSE REDUCTION: This exam was performed according to the departmental dose-optimization program which includes automated exposure control, adjustment of the mA and/or kV according to patient size and/or use of iterative reconstruction technique. CONTRAST:  66m OMNIPAQUE IOHEXOL 300 MG/ML  SOLN COMPARISON:  CT angiogram head/neck 11/19/2020. FINDINGS: Mild to moderately motion degraded exam. Pharynx and larynx: Streak and beam hardening artifact arising from dental restoration partially obscures the oral cavity. Within this limitation, there is no appreciable swelling or mass within the oral cavity, pharynx or larynx. Salivary glands: No inflammation, mass, or stone. Thyroid: The gland is small, but otherwise unremarkable. Lymph nodes: No pathologically enlarged lymph nodes within the neck. Vascular: As before, the imaged ascending thoracic aorta measures up to 4.3 cm in diameter. Atherosclerotic plaque within the visualized aortic arch, proximal major branch vessels of the neck and carotid arteries. Limited intracranial: No evidence of acute intracranial abnormality within the field of view. Visualized orbits: No orbital mass or acute orbital finding. Mastoids and visualized paranasal sinuses: 2.5 cm mucous retention cyst within the left maxillary sinus. Mild mucosal thickening within the bilateral ethmoid sinuses. No significant mastoid effusion. Skeleton: Cervical spondylosis with multilevel disc space narrowing, disc bulges, posterior disc osteophytes, endplate spurring, uncovertebral hypertrophy and facet arthrosis. Disc space narrowing is advanced at C4-C5, C5-C6 and C6-C7. No appreciable high-grade spinal canal stenosis. Multilevel bony neural foraminal narrowing. Incompletely assessed thoracic levocurvature. Upper chest: Patchy  dependent atelectasis within the right  lung at the imaged levels. IMPRESSION: 1. Motion degraded exam. 2. No appreciable swelling or mass within the oral cavity, pharynx or larynx. 3. Paranasal sinus disease, as described. 4. 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 Recommend annual imaging followup by CTA or MRA. This recommendation follows 2010 ACCF/AHA/AATS/ACR/ASA/SCA/SCAI/SIR/STS/SVM Guidelines for the Diagnosis and Management of Patients with Thoracic Aortic Disease. Circulation. 2010; 121: L845-X646. Aortic aneurysm NOS (ICD10-I71.9). 5. Aortic Atherosclerosis (ICD10-I70.0). Atherosclerotic plaque also present within the proximal major branch vessels of the neck and carotid arteries. 6. Cervical spondylosis, as described. 7. Incompletely imaged thoracic levocurvature. Electronically Signed   By: Kellie Simmering D.O.   On: 10/12/2021 15:56    Procedures Procedures    Medications Ordered in ED Medications  lactated ringers infusion ( Intravenous New Bag/Given 10/12/21 1548)  metroNIDAZOLE (FLAGYL) IVPB 500 mg (500 mg Intravenous New Bag/Given 10/12/21 1550)  vancomycin (VANCOREADY) IVPB 1750 mg/350 mL (has no administration in time range)  ceFEPIme (MAXIPIME) 2 g in sodium chloride 0.9 % 100 mL IVPB (2 g Intravenous New Bag/Given 10/12/21 1546)  vancomycin (VANCOREADY) IVPB 1250 mg/250 mL (has no administration in time range)  lactated ringers bolus 500 mL (0 mLs Intravenous Stopped 10/12/21 1325)  lactated ringers bolus 1,000 mL (0 mLs Intravenous Stopped 10/12/21 1511)  magnesium sulfate IVPB 2 g 50 mL (0 g Intravenous Stopped 10/12/21 1511)  lactated ringers bolus 1,000 mL (1,000 mLs Intravenous New Bag/Given 10/12/21 1548)  iohexol (OMNIPAQUE) 300 MG/ML solution 75 mL (75 mLs Intravenous Contrast Given 10/12/21 1525)    ED Course/ Medical Decision Making/ A&P                           Medical Decision Making Amount and/or Complexity of  Data Reviewed Labs: ordered. Radiology: ordered.  Risk Prescription drug management.   This patient presents to the ED for concern of voice change, this involves an extensive number of treatment options, and is a complaint that carries with it a high risk of complications and morbidity.  The differential diagnosis includes CVA, laryngitis, neoplasm, foreign body, dehydration, infection   Co morbidities that complicate the patient evaluation  CVA, HTN, IBS, chronic pain, GERD, osteoporosis, HLD, CHF, depression,   Additional history obtained:  Additional history obtained from N/A External records from outside source obtained and reviewed including EMR   Lab Tests:  I Ordered, and personally interpreted labs.  The pertinent results include: AKI, hypomagnesemia, leukocytosis, lactic acidosis, nitrite positive urine with pyuria   Imaging Studies ordered:  I ordered imaging studies including CT of head and soft tissue neck I independently visualized and interpreted imaging which showed no acute findings I agree with the radiologist interpretation   Cardiac Monitoring: / EKG:  The patient was maintained on a cardiac monitor.  I personally viewed and interpreted the cardiac monitored which showed an underlying rhythm of: Sinus rhythm  Problem List / ED Course / Critical interventions / Medication management  Patient is a 81 year old male with history of multiple CVA, presenting for voice change.  Onset was this morning upon awakening.  On arrival in the ED, patient is found to have a hoarse voice.  He denies any throat pain.  He denies any difficulty swallowing, other than his throat feeling very dry.  Patient's vital signs notable for widened pulse pressure but otherwise normal.  On exam, he does have chronic findings of left hemibody weakness, left  extremity contractures, and RLE weakness.  He states that these are baseline for him given his prior history of strokes.  Patient's  oromucosa found to be very dry.  IV fluids bolus was ordered.  Diagnostic work-up was initiated.  Patient underwent CT imaging of head and soft tissue neck to assess for possible new stroke and/or laryngeal findings to explain his hoarseness.  Condom catheter was placed.  Following IV fluid bolus, patient unable to urinate.  On bladder scan, patient has scant amount of urine in his bladder.  Additional IV fluids were ordered.  Patient's lab work is notable for an AKI, leukocytosis, and hypomagnesemia.  More IV fluids were ordered.  Magnesium replacement was given.  Patient was found to have a lactic acidosis.  Broad-spectrum antibiotics were ordered for treatment of infection given his leukocytosis and lactic acidosis.  He remained hemodynamically stable.  Urine was ultimately obtained which did show evidence of acute infection.  Care of patient was signed out to oncoming ED provider. I ordered medication including IVF for dehydration; magnesium sulfate for hypomagnesemia; broad-spectrum antibiotics for concern of sepsis Reevaluation of the patient after these medicines showed that the patient improved I have reviewed the patients home medicines and have made adjustments as needed   Social Determinants of Health:  Lives in skilled nursing facility         Final Clinical Impression(s) / ED Diagnoses Final diagnoses:  AKI (acute kidney injury) ([place])  Lactic acidosis  Hoarseness of voice    Rx / DC Orders ED Discharge Orders     None         Godfrey Pick, MD 10/12/21 1619

## 2021-10-12 NOTE — ED Notes (Signed)
Phlebotomist tried to draw 2nd blood cultures with no success. Will notify MD.

## 2021-10-12 NOTE — Sepsis Progress Note (Signed)
Elink following Code Sepsis. 

## 2021-10-12 NOTE — Hospital Course (Addendum)
Gregory Kerr is a 81 year-old male who presented from ALF with 2 days of decreased appetite, difficulty speaking, disorientation, lethargy and difficulty moving found to be septic on admission. hx of prior stroke with resultant L spastic hemiplegia, GERD, HTN, HLD, pseudobulbar affect, osteoporosis, chronic pain, diastolic CHF, COPD, IBS, thoracic ascending aortic aneurysm, CKD 3A.  Urosepsis Patient with decreased appetite, feeling disoriented, difficulty moving and speaking, and overall feeling "weird" for the past 2 days. Remained afebrile.  CTA head negative for acute ischemic or hemorrhagic changes.  CT soft tissue neck with ascending thoracic aorta aneurysm but no changes of recent pathology to affect his pharynx or larynx. WBC 25.4 initially, and downtrended with antibiotics to *** at time of discharge.  UA significant for nitrites, moderate leukocytes, greater than 50 white blood cells, and many bacteria. Lactic acid 3.2 on admission, and repeat 2.6 s/p 2.5 L fluid resuscitation. Had suprapubic catheter in the past, but was removed due to persistent dislodging and patient decided not to pursue any further cathether. He was treated with IV Vancomycin and IV Cefepime x 1 day, then transitioned to Amoxicillin '500mg'$  TID for a total of 7 days antibiotics. At time of discharge, he was hemodynamically stable without urinary symptoms. ****   Hypokalemia K+ 3.4 on admission. Repleted appropriately. K+ *** at discharge.  Hypertension BP well-controlled throughout hospitalization. Lisniopril was held at last admission and not resumed.  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 Recommend annual imaging follow up.

## 2021-10-12 NOTE — ED Notes (Signed)
Bladder scan result is 58. Will notify MD.

## 2021-10-13 DIAGNOSIS — N179 Acute kidney failure, unspecified: Secondary | ICD-10-CM | POA: Diagnosis not present

## 2021-10-13 LAB — CBC
HCT: 34 % — ABNORMAL LOW (ref 39.0–52.0)
Hemoglobin: 11.6 g/dL — ABNORMAL LOW (ref 13.0–17.0)
MCH: 29.1 pg (ref 26.0–34.0)
MCHC: 34.1 g/dL (ref 30.0–36.0)
MCV: 85.2 fL (ref 80.0–100.0)
Platelets: 81 10*3/uL — ABNORMAL LOW (ref 150–400)
RBC: 3.99 MIL/uL — ABNORMAL LOW (ref 4.22–5.81)
RDW: 14.6 % (ref 11.5–15.5)
WBC: 17.6 10*3/uL — ABNORMAL HIGH (ref 4.0–10.5)
nRBC: 0 % (ref 0.0–0.2)

## 2021-10-13 LAB — COMPREHENSIVE METABOLIC PANEL
ALT: 42 U/L (ref 0–44)
AST: 45 U/L — ABNORMAL HIGH (ref 15–41)
Albumin: 2.3 g/dL — ABNORMAL LOW (ref 3.5–5.0)
Alkaline Phosphatase: 90 U/L (ref 38–126)
Anion gap: 4 — ABNORMAL LOW (ref 5–15)
BUN: 27 mg/dL — ABNORMAL HIGH (ref 8–23)
CO2: 22 mmol/L (ref 22–32)
Calcium: 8.1 mg/dL — ABNORMAL LOW (ref 8.9–10.3)
Chloride: 112 mmol/L — ABNORMAL HIGH (ref 98–111)
Creatinine, Ser: 0.76 mg/dL (ref 0.61–1.24)
GFR, Estimated: >60 mL/min (ref >60)
Glucose, Bld: 91 mg/dL (ref 70–99)
Potassium: 3.4 mmol/L — ABNORMAL LOW (ref 3.5–5.1)
Sodium: 138 mmol/L (ref 135–145)
Total Bilirubin: 0.8 mg/dL (ref 0.3–1.2)
Total Protein: 5.3 g/dL — ABNORMAL LOW (ref 6.5–8.1)

## 2021-10-13 LAB — LACTIC ACID, PLASMA: Lactic Acid, Venous: 1.1 mmol/L (ref 0.5–1.9)

## 2021-10-13 LAB — MAGNESIUM: Magnesium: 1.8 mg/dL (ref 1.7–2.4)

## 2021-10-13 MED ORDER — POTASSIUM CHLORIDE 20 MEQ PO PACK
40.0000 meq | PACK | Freq: Once | ORAL | Status: AC
  Administered 2021-10-13: 40 meq via ORAL
  Filled 2021-10-13: qty 2

## 2021-10-13 MED ORDER — VANCOMYCIN HCL 1750 MG/350ML IV SOLN
1750.0000 mg | INTRAVENOUS | Status: DC
  Administered 2021-10-13: 1750 mg via INTRAVENOUS
  Filled 2021-10-13: qty 350

## 2021-10-13 MED ORDER — DEXTROMETHORPHAN-QUINIDINE 20-10 MG PO CAPS
1.0000 | ORAL_CAPSULE | Freq: Every evening | ORAL | Status: DC
  Administered 2021-10-13 – 2021-10-18 (×6): 1 via ORAL
  Filled 2021-10-13 (×6): qty 1

## 2021-10-13 MED ORDER — TRAMADOL HCL 50 MG PO TABS
50.0000 mg | ORAL_TABLET | Freq: Two times a day (BID) | ORAL | Status: DC
  Administered 2021-10-13 – 2021-10-18 (×13): 50 mg via ORAL
  Filled 2021-10-13 (×13): qty 1

## 2021-10-13 MED ORDER — SODIUM CHLORIDE 0.9 % IV SOLN
2.0000 g | Freq: Three times a day (TID) | INTRAVENOUS | Status: DC
  Administered 2021-10-13 – 2021-10-14 (×3): 2 g via INTRAVENOUS
  Filled 2021-10-13 (×3): qty 12.5

## 2021-10-13 MED ORDER — ATORVASTATIN CALCIUM 40 MG PO TABS
40.0000 mg | ORAL_TABLET | Freq: Every evening | ORAL | Status: DC
  Administered 2021-10-13 – 2021-10-18 (×6): 40 mg via ORAL
  Filled 2021-10-13 (×6): qty 1

## 2021-10-13 MED ORDER — CLOPIDOGREL BISULFATE 75 MG PO TABS
75.0000 mg | ORAL_TABLET | Freq: Every evening | ORAL | Status: DC
  Administered 2021-10-13 – 2021-10-18 (×6): 75 mg via ORAL
  Filled 2021-10-13 (×6): qty 1

## 2021-10-13 NOTE — Evaluation (Signed)
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   Pneumonia    "once or twice" (09/22/2014)   Retinal detachment    left eye   Stroke [facility]) 2007   "left side doesn't work now" (09/22/2014)   Past Surgical History:  Past Surgical History:  Procedure Laterality Date   APPENDECTOMY  05/27/2009   Ronnald Collum, MD   CATARACT EXTRACTION W/ INTRAOCULAR LENS  IMPLANT, BILATERAL Bilateral ?2014   CHOLECYSTECTOMY N/A 09/26/2014   Procedure: LAPAROSCOPIC CHOLECYSTECTOMY WITH INTRAOPERATIVE CHOLANGIOGRAM;  Surgeon: Donnie Mesa, MD;  Location: [place];  Service: General;  Laterality: N/A;   COLONOSCOPY WITH PROPOFOL N/A 12/29/2015   Procedure: COLONOSCOPY WITH PROPOFOL;  Surgeon: Wilford Corner, MD;  Location: [facility] ENDOSCOPY;  Service: Endoscopy;  Laterality: N/A;   ERCP N/A 09/25/2014   Procedure: ENDOSCOPIC RETROGRADE CHOLANGIOPANCREATOGRAPHY (ERCP);  Surgeon: Clarene Essex, MD;  Location: [facility] ENDOSCOPY;  Service: Endoscopy;  Laterality: N/A;   IR CATHETER TUBE CHANGE  05/24/2021   PARS PLANA VITRECTOMY Left 01/30/2019   Procedure: PARS PLANA VITRECTOMY WITH 25 GAUGE;  Surgeon: Sherlynn Stalls, MD;  Location: [place];  Service: Ophthalmology;  Laterality: Left;   PARS PLANA VITRECTOMY Right 11/13/2019   Procedure: PARS PLANA VITRECTOMY WITH 25 GAUGE WITH REMOVAL OF POSTERIOR CAPSULAR OPACIFICATION WITH VITRECTOR;  Surgeon: Sherlynn Stalls, MD;   Location: [place];  Service: Ophthalmology;  Laterality: Right;   PHOTOCOAGULATION WITH LASER Left 01/30/2019   Procedure: PHOTOCOAGULATION WITH LASER;  Surgeon: Sherlynn Stalls, MD;  Location: [place];  Service: Ophthalmology;  Laterality: Left;   HPI:  Gregory Kerr is a 81 y.o. male presenting from SNF (long term resident over 15 years) with 2 days of decreased appetite, difficulty speaking, disorientation, lethargy, and difficulty moving found to be septic on admission.  Pt reported prolonged mastication to MD. CT neck shows Cervical spondylosis with multilevel disc space narrowing,  disc bulges, posterior disc osteophytes, endplate spurring,  uncovertebral hypertrophy and facet arthrosis. Disc space narrowing  is advanced at C4-C5, C5-C6 and C6-C7. 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    Assessment / Plan / Recommendation  Clinical Impression  Pt demonstrates no impairment in swallowing or mastication. Feels much better now with no prolonged oral phase. Pt to continue regular diet and thin liquids. SLP Visit Diagnosis: Dysphagia, unspecified (R13.10)    Aspiration Risk  Mild aspiration risk    Diet Recommendation Regular;Thin liquid   Liquid Administration via: Cup;Straw Medication Administration: Whole meds with liquid Supervision: Patient able to self feed;Staff to assist with self feeding (needs set up) Postural Changes: Seated upright at 90 degrees    Other  Recommendations      Recommendations for follow up therapy are one component of a multi-disciplinary discharge planning process, led by the attending physician.  Recommendations may be updated based on  patient status, additional functional criteria and insurance authorization.  Follow up Recommendations        Assistance Recommended at Discharge    Functional Status Assessment    Frequency and Duration             Prognosis        Swallow Study   General HPI: Gregory Kerr is a 81 y.o. male presenting from SNF (long term resident over 15 years) with 2 days of decreased appetite, difficulty speaking, disorientation, lethargy, and difficulty moving found to be septic on admission.  Pt reported prolonged mastication to MD. CT neck shows Cervical spondylosis with multilevel disc space narrowing,  disc bulges, posterior disc osteophytes, endplate spurring,  uncovertebral hypertrophy and facet arthrosis. Disc space narrowing  is advanced at C4-C5, C5-C6 and C6-C7. 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 Type of Study: Bedside Swallow Evaluation Diet Prior to this Study: Regular;Thin liquids Temperature Spikes Noted: No Respiratory Status: Room air History of Recent Intubation: No Behavior/Cognition: Alert;Cooperative;Pleasant mood Oral Cavity Assessment: Within Functional Limits Oral Care Completed by SLP: No Oral Cavity - Dentition: Adequate natural dentition Vision: Functional for self-feeding Self-Feeding Abilities: Needs assist Patient Positioning: Upright in bed Baseline Vocal Quality: Normal Volitional Cough: Strong Volitional Swallow: Able to elicit    Oral/Motor/Sensory Function Overall Oral Motor/Sensory Function: Within functional limits   Ice Chips     Thin Liquid Thin Liquid: Within functional limits    Nectar Thick Nectar Thick Liquid: Not tested   Honey Thick Honey Thick Liquid: Not tested   Puree Puree: Within functional limits   Solid     Solid: Within functional limits      Gregory Kerr, Gregory Kerr 10/13/2021,12:44 PM

## 2021-10-13 NOTE — Progress Notes (Signed)
FPTS Brief Progress Note  S: Patient sleeping    O: BP 136/71 (BP Location: Right Arm)   Pulse 84   Temp 98.7 F (37.1 C)   Resp 18   Ht '5\' 9"'$  (1.753 m)   Wt 74.8 kg   SpO2 92%   BMI 24.35 kg/m     A/P: Urosepsis 2/2 UTI Currently on Cefepime and Vanc. VSS - continue abx - f/u blood and urine cultures   - Orders reviewed. Labs for AM ordered, which was adjusted as needed.   Remainder of plan per day team's note   Shary Key, DO 10/13/2021, 11:43 PM PGY-2, [facility] Night Resident  Please page [phone] with questions.

## 2021-10-13 NOTE — TOC Initial Note (Signed)
Transition of Care [facility]) - Initial/Assessment Note    Patient Details  Name: Gregory Kerr MRN: [id number] Date of Birth: Apr 17, 1941  Transition of [facility]) CM/SW Contact:    Milinda Antis, [place] Phone Number: 10/13/2021, 12:28 PM  Clinical Narrative:                 CSW met with the patient at bedside.  The patient reports that he is LTC at [facility] and would like to return at discharge.  CSW contacted Sharyn Lull in admissions at [facility] and verified that the patient is LTC and can return when medically ready.  TOC will continue to follow.    Expected Discharge Plan: Skilled Nursing Facility Barriers to Discharge: Continued Medical Work up   Patient Goals and CMS Choice Patient states their goals for this hospitalization and ongoing recovery are:: To return to facility CMS Medicare.gov Compare Post Acute Care list provided to:: Patient Choice offered to / list presented to : Patient  Expected Discharge Plan and Services Expected Discharge Plan: [place] arrangements for the past 2 months: [place]                                      Prior Living Arrangements/Services Living arrangements for the past 2 months: [place] Lives with:: Facility Resident Patient language and need for interpreter reviewed:: Yes Do you feel safe going back to the place where you live?: Yes      Need for Family Participation in Patient Care: No (Comment) Care giver support system in place?: Yes (comment)   Criminal Activity/Legal Involvement Pertinent to Current Situation/Hospitalization: No - Comment as needed  Activities of Daily Living Home Assistive Devices/Equipment: Wheelchair ADL Screening (condition at time of admission) Patient's cognitive ability adequate to safely complete daily activities?: No Is the patient deaf or have difficulty hearing?: No Does the patient have difficulty seeing, even when  wearing glasses/contacts?: No Does the patient have difficulty concentrating, remembering, or making decisions?: No Patient able to express need for assistance with ADLs?: Yes Does the patient have difficulty dressing or bathing?: Yes Independently performs ADLs?: No Communication: Independent Dressing (OT): Dependent Is this a change from baseline?: Pre-admission baseline Grooming: Dependent Is this a change from baseline?: Pre-admission baseline Feeding: Dependent Is this a change from baseline?: Pre-admission baseline Bathing: Dependent Is this a change from baseline?: Pre-admission baseline Toileting: Dependent Is this a change from baseline?: Pre-admission baseline In/Out Bed: Dependent Is this a change from baseline?: Pre-admission baseline Walks in Home: Dependent Is this a change from baseline?: Pre-admission baseline Does the patient have difficulty walking or climbing stairs?: Yes Weakness of Legs: Left Weakness of Arms/Hands: Left  Permission Sought/Granted   Permission granted to share information with : Yes, Verbal Permission Granted     Permission granted to share info w AGENCY: SNF        Emotional Assessment Appearance:: Appears stated age Attitude/Demeanor/Rapport: Gracious Affect (typically observed): Accepting, Pleasant Orientation: : Oriented to Self, Oriented to Place, Oriented to Situation Alcohol / Substance Use: Not Applicable Psych Involvement: No (comment)  Admission diagnosis:  Lactic acidosis [E87.20] Hoarseness of voice [R49.0] AKI (acute kidney injury) ([place]) [N17.9] Sepsis ([place]) [A41.9] Urinary tract infection with hematuria, site unspecified [N39.0, R31.9] Sepsis with acute renal failure without septic shock, due to unspecified organism, unspecified acute renal failure type (  [place]) [A41.9, R65.20, N17.9] Patient Active Problem List   Diagnosis Date Noted   Acute renal failure superimposed on stage 3a chronic kidney disease ([place]) 06/29/2021    Bladder spasm 06/29/2021   Sepsis ([place]) 06/28/2021   Spasticity 06/28/2021   Prolonged QT interval 06/28/2021   Constipation 06/28/2021   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 03/08/2021   Weakness    Acute CVA (cerebrovascular accident) ([place]) 11/19/2020   Allergic rhinitis due to allergen 04/27/2016   Chest pain 02/27/2016   Thoracic ascending aortic aneurysm ([place]) 02/27/2016   Leukocytosis 02/27/2016   Major depression, chronic 02/16/2016   Neuropathic pain 02/16/2016   D (diarrhea) 12/29/2015   Coronary artery disease involving native coronary artery of native heart with angina pectoris ([place]) 11/12/2015   History of CVA with residual deficit 10/14/2015   Hyperlipidemia 07/16/2015   Hypertensive heart disease with congestive heart failure ([place]) 12/10/2014   Angina pectoris ([place]) 12/10/2014   Chronic obstructive pulmonary disease ([place]) 12/10/2014   Irritable bowel syndrome with diarrhea 12/10/2014   Senile osteoporosis 12/10/2014   CVA (cerebral vascular accident) ([place]) 12/10/2014   Acute on chronic diastolic CHF (congestive heart failure) ([place]) 09/27/2014   Hypokalemia 09/23/2014   Pancreatitis, gallstone 09/22/2014   Chronic diastolic heart failure ([place]) 09/22/2014   Acute cholangitis 09/22/2014   Gallstone pancreatitis 09/22/2014   Hiatal hernia with GERD 08/31/2014   Vitamin D deficiency 08/03/2014   Hyperlipidemia LDL goal <100 08/03/2014   CVA, old, hemiparesis ([place]) 08/03/2014   Leg edema, left 08/03/2014   Gastroesophageal reflux disease without esophagitis 06/18/2014   Essential hypertension, benign 06/18/2014   Primary generalized hypertrophic osteoarthrosis 06/18/2014   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   UTI (urinary tract infection) 04/04/2014   Essential hypertension 04/03/2014   Chronic airway obstruction, not elsewhere classified 01/15/2014   Dermatophytosis of nail 12/18/2013   Unspecified essential hypertension 12/18/2013   Generalized  osteoarthrosis, unspecified site 12/18/2013   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   GERD (gastroesophageal reflux disease) 08/11/2013   Disuse osteoporosis 08/11/2013   Chronic pain syndrome 01/30/2013   IBS (irritable bowel syndrome) 01/30/2013   Cerebral infarct ([place]) 10/24/2012   HTN (hypertension) 10/24/2012   Osteoporosis 10/24/2012   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

## 2021-10-13 NOTE — Evaluation (Signed)
Physical Therapy Evaluation & Discharge Patient Details Name: Gregory Kerr MRN: [id number] DOB: [birth year] Today's Date: 10/13/2021  History of Present Illness  81 y/o male presented to ED on 10/12/21 from SNF for abnormal speech. Was seen on 5/20 for dislodged suprapubic catheter and sent back to SNF without catheter placement. Admitted for urosepsis 2/2 acute cystitis. PMH: HTN, CVA with L spastic hemiplegia, CHF, COPD.  Clinical Impression  Patient admitted with the above. PTA, patient has been living at [facility] years with intermittent participation with therapy services depending on medical needs. Staff at facility utilize hoyer lift at baseline and perform all ADLs at bed level. Patient requires totalA to attempt sitting EOB due to contractures, hip flexor tightness, and general weakness. Patient is at his baseline functionally and no further skilled PT needs identified acutely. Recommend return to SNF and continue current level of care. PT will sign off.        Recommendations for follow up therapy are one component of a multi-disciplinary discharge planning process, led by the attending physician.  Recommendations may be updated based on patient status, additional functional criteria and insurance authorization.  Follow Up Recommendations Skilled nursing-short term rehab (<3 hours/day)    Assistance Recommended at Discharge Frequent or constant Supervision/Assistance  Patient can return home with the following       Equipment Recommendations None recommended by PT  Recommendations for Other Services       Functional Status Assessment Patient has not had a recent decline in their functional status     Precautions / Restrictions Precautions Precautions: Fall Precaution Comments: L UE and LE contractures Restrictions Weight Bearing Restrictions: No      Mobility  Bed Mobility Overal bed mobility: Needs Assistance Bed Mobility: Supine to Sit, Sit to Supine      Supine to sit: Total assist Sit to supine: Total assist   General bed mobility comments: unable to achieve sitting EOB or assist to. Hip flexor tightness preventing anterior translation to EOB    Transfers                   General transfer comment: unable    Ambulation/Gait               General Gait Details: non ambulatory at baseline  Stairs            Wheelchair Mobility    Modified Rankin (Stroke Patients Only)       Balance                                             Pertinent Vitals/Pain Pain Assessment Pain Assessment: Faces Faces Pain Scale: Hurts even more Pain Location: LEs Pain Descriptors / Indicators: Grimacing, Guarding Pain Intervention(s): Monitored during session, Repositioned, Limited activity within patient's tolerance    Home Living Family/patient expects to be discharged to:: Skilled nursing facility                   Additional Comments: has been at [facility] x [facility] years    Prior Function Prior Level of Function : Needs assist             Mobility Comments: hoyer lift at baseline. patient states he is able to get himself around in w/c but unsure due to contractures on L and weakness on R ADLs Comments:  bed level bathing by nursing staff     Hand Dominance   Dominant Hand: Right    Extremity/Trunk Assessment   Upper Extremity Assessment Upper Extremity Assessment: Defer to OT evaluation    Lower Extremity Assessment Lower Extremity Assessment: RLE deficits/detail;LLE deficits/detail RLE Deficits / Details: 2-/5; R knee ROM limited to 0-20 degrees of flexion. LLE Deficits / Details: 0/5 (only able to wiggle toes minimally); PF contracture with bone protruding against skin. Knee extension to 0 but unable to flex    Cervical / Trunk Assessment Cervical / Trunk Assessment: Kyphotic  Communication   Communication: No difficulties  Cognition Arousal/Alertness:  Awake/alert Behavior During Therapy: WFL for tasks assessed/performed Overall Cognitive Status: No family/caregiver present to determine baseline cognitive functioning                                 General Comments: seems to be at baseline cognitively        General Comments      Exercises     Assessment/Plan    PT Assessment Patient does not need any further PT services  PT Problem List         PT Treatment Interventions      PT Goals (Current goals can be found in the Care Plan section)  Acute Rehab PT Goals Patient Stated Goal: to get a new w/c PT Goal Formulation: All assessment and education complete, DC therapy    Frequency       Co-evaluation               AM-PAC PT "6 Clicks" Mobility  Outcome Measure Help needed turning from your back to your side while in a flat bed without using bedrails?: Total Help needed moving from lying on your back to sitting on the side of a flat bed without using bedrails?: Total Help needed moving to and from a bed to a chair (including a wheelchair)?: Total Help needed standing up from a chair using your arms (e.g., wheelchair or bedside chair)?: Total Help needed to walk in hospital room?: Total Help needed climbing 3-5 steps with a railing? : Total 6 Click Score: 6    End of Session   Activity Tolerance: Patient tolerated treatment well Patient left: in bed;with call bell/phone within reach;with bed alarm set;with nursing/sitter in room Nurse Communication: Need for lift equipment PT Visit Diagnosis: Muscle weakness (generalized) (M62.81)    Time: 0802-0820 PT Time Calculation (min) (ACUTE ONLY): 18 min   Charges:   PT Evaluation $PT Eval Moderate Complexity: 1 Mod          Breslin Burklow A. Gilford Rile PT, DPT Acute Rehabilitation Services Pager [phone] Office [phone]   Linna Hoff 10/13/2021, 10:11 AM

## 2021-10-13 NOTE — Progress Notes (Addendum)
Family Medicine Teaching Service Daily Progress Note Intern Pager: [phone]  Patient name: Gregory Kerr Medical record number: [id number] Date of birth: 1940/07/12 Age: 81 y.o. Gender: male  Primary Care Provider: Virgel Bouquet, MD Consultants: None Code Status: Full- confirmed with patient this AM  Pt Overview and Major Events to Date:  5/25: Admitted; code sepsis called in ED  Assessment and Plan: Gregory Kerr is a 81 year-old male who presented with likely urosepsis. 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  Likely urosepsis 2/2 lower urinary tract infection  Chronic urinary incontinence Improved leukocytosis to 17.6. Afebrile and hemodynamically stable Still w/ lactic acidosis- most recently 2.6, will trend On broad-spectrum abx- hx UTI w/ proteus, enterofaecolis, and pseudomonas, and most recently corynebacterium- current abx will cover well for these bacteria Source in the past was likely suprapubic catheter, has condom catheter now - Continue IV Cefepime 2 g BID and IV Vancomycin - F/u blood cx - F/u urine cx - Trend LA until normalizes - Bladder scans - Strict I/Os - Condom catheter  Muscle spasms  Chronic pain - Continue home Baclofen 20 mg TID - Continue Gabapentin '100mg'$  TID - Continue Tramadol 50 mg BID  Hypokalemia K+ 3.4 this am, repleted - Monitor w/ BMP in AM  Other conditions chronic and stable HTN- monitor HLD- continue home atorvostatin Hx HFpEF- on cardiac monitoring Hx CVA, hemiparesis Aortic aneurysm- yearly f/u with CTA or MRA   FEN/GI: Pending SLP eval PPx: Lovenox subq DVT ppx Dispo:Pending PT recommendations  pending clinical improvement . Barriers include IV abx.   Subjective:  Feeling "the same" as yesterday. Oriented to self, location but thinks the year is 2013. Denies any pain- including chest pain, abdominal pain,  headache. Last BM  2 days ago. No urinary urge or frequency.   Objective: Temp:  [97.6 F (36.4 C)-98.6 F (37 C)] 97.8 F (36.6 C) (05/25 0624) Pulse Rate:  [78-108] 78 (05/25 0624) Resp:  [13-23] 17 (05/25 0624) BP: (103-143)/(51-105) 138/69 (05/25 0624) SpO2:  [89 %-94 %] 94 % (05/25 0624) Weight:  [74.8 kg] 74.8 kg (05/24 2247) Physical Exam: General: Chronically-ill appearing elderly male, in no distress, awake and alert Cardiovascular: RRR, no murmurs Respiratory: Clear in all fields, normal WOB on room air Abdomen: Soft, NTND Extremities: Contractures of upper and lower extremities (chronic) Skin: Warm, dry Neuro: Awake, alert. Speech clear and fluent, voice soft.  Psych: Normal mood and affect.  Laboratory: Recent Labs  Lab 10/07/21 2355 10/08/21 0025 10/12/21 1213 10/13/21 0407  WBC 6.4  --  25.4* 17.6*  HGB 13.8 13.9 15.1 11.6*  HCT 43.9 41.0 45.0 34.0*  PLT 164  --  PLATELET CLUMPS NOTED ON SMEAR, UNABLE TO ESTIMATE 81*   Recent Labs  Lab 10/08/21 0025 10/12/21 1213 10/13/21 0407  NA 142 140 138  K 4.0 3.4* 3.4*  CL 110 111 112*  CO2  --  19* 22  BUN 18 38* 27*  CREATININE 0.50* 1.22 0.76  CALCIUM  --  8.8* 8.1*  PROT  --  6.4* 5.3*  BILITOT  --  1.4* 0.8  ALKPHOS  --  117 90  ALT  --  58* 42  AST  --  57* 45*  GLUCOSE 118* 142* 91     Imaging/Diagnostic Tests: CT HEAD WO CONTRAST  Result Date: 10/12/2021 CLINICAL DATA:  Provided history: Neuro deficit, acute, stroke suspected. Additional history  provided: Abnormal speech, history of multiple prior strokes, residual bilateral weakness. EXAM: CT HEAD WITHOUT CONTRAST TECHNIQUE: Contiguous axial images were obtained from the base of the skull through the vertex without intravenous contrast. RADIATION DOSE REDUCTION: This exam was performed according to the departmental dose-optimization program which includes automated exposure control, adjustment of the mA and/or kV according to patient size and/or use of iterative  reconstruction technique. COMPARISON:  CT angiogram head/neck 11/19/2020. Brain MRI 12/18/2020. FINDINGS: Brain: Mild generalized cerebral atrophy. Known chronic small-vessel infarcts within the bilateral cerebral hemispheric white matter, right thalamus and within the pons. Background moderate patchy and ill-defined hypoattenuation within the cerebral white matter, nonspecific but compatible with chronic small vessel ischemic disease. Callosal dysgenesis. There is no acute intracranial hemorrhage. No demarcated cortical infarct. No extra-axial fluid collection. No evidence of an intracranial mass. No midline shift. Vascular: No hyperdense vessel. Skull: No fracture or aggressive osseous lesion. Sinuses/Orbits: No mass or acute finding within the imaged orbits. 2.5 cm mucous retention cyst within the left maxillary sinus. Minimal mucosal thickening within the bilateral ethmoid sinuses. IMPRESSION: 1. No evidence of acute intracranial abnormality. 2. Known chronic small-vessel infarcts within the bilateral cerebral hemispheric white matter, right thalamus and within the pons. 3. Background moderate cerebral white matter chronic small vessel ischemic disease. 4. Mild generalized cerebral atrophy. 5. Callosal dysgenesis. 6. Paranasal sinus disease, as described. Electronically Signed   By: Kellie Simmering D.O.   On: 10/12/2021 15:36   CT SOFT TISSUE NECK W CONTRAST  Result Date: 10/12/2021 CLINICAL DATA:  Hoarseness, normal laryngeal exam. Additional history provided: Abnormal speech. EXAM: CT NECK WITH CONTRAST TECHNIQUE: Multidetector CT imaging of the neck was performed using the standard protocol following the bolus administration of intravenous contrast. RADIATION DOSE REDUCTION: This exam was performed according to the departmental dose-optimization program which includes automated exposure control, adjustment of the mA and/or kV according to patient size and/or use of iterative reconstruction technique.  CONTRAST:  28m OMNIPAQUE IOHEXOL 300 MG/ML  SOLN COMPARISON:  CT angiogram head/neck 11/19/2020. FINDINGS: Mild to moderately motion degraded exam. Pharynx and larynx: Streak and beam hardening artifact arising from dental restoration partially obscures the oral cavity. Within this limitation, there is no appreciable swelling or mass within the oral cavity, pharynx or larynx. Salivary glands: No inflammation, mass, or stone. Thyroid: The gland is small, but otherwise unremarkable. Lymph nodes: No pathologically enlarged lymph nodes within the neck. Vascular: As before, the imaged ascending thoracic aorta measures up to 4.3 cm in diameter. Atherosclerotic plaque within the visualized aortic arch, proximal major branch vessels of the neck and carotid arteries. Limited intracranial: No evidence of acute intracranial abnormality within the field of view. Visualized orbits: No orbital mass or acute orbital finding. Mastoids and visualized paranasal sinuses: 2.5 cm mucous retention cyst within the left maxillary sinus. Mild mucosal thickening within the bilateral ethmoid sinuses. No significant mastoid effusion. Skeleton: Cervical spondylosis with multilevel disc space narrowing, disc bulges, posterior disc osteophytes, endplate spurring, uncovertebral hypertrophy and facet arthrosis. Disc space narrowing is advanced at C4-C5, C5-C6 and C6-C7. No appreciable high-grade spinal canal stenosis. Multilevel bony neural foraminal narrowing. Incompletely assessed thoracic levocurvature. Upper chest: Patchy dependent atelectasis within the right lung at the imaged levels. IMPRESSION: 1. Motion degraded exam. 2. No appreciable swelling or mass within the oral cavity, pharynx or larynx. 3. Paranasal sinus disease, as described. 4. 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 Recommend annual imaging followup  by CTA or MRA. This recommendation follows 2010  ACCF/AHA/AATS/ACR/ASA/SCA/SCAI/SIR/STS/SVM Guidelines for the Diagnosis and Management of Patients with Thoracic Aortic Disease. Circulation. 2010; 121: W299-B716. Aortic aneurysm NOS (ICD10-I71.9). 5. Aortic Atherosclerosis (ICD10-I70.0). Atherosclerotic plaque also present within the proximal major branch vessels of the neck and carotid arteries. 6. Cervical spondylosis, as described. 7. Incompletely imaged thoracic levocurvature. Electronically Signed   By: Kellie Simmering D.O.   On: 10/12/2021 15:56     Orvis Brill, DO 10/13/2021, 7:23 AM PGY-1, [place] Intern pager: [phone], text pages welcome

## 2021-10-13 NOTE — Evaluation (Signed)
Occupational Therapy Evaluation Patient Details Name: Gregory Kerr MRN: [id number] DOB: 03-24-1941 Today's Date: 10/13/2021   History of Present Illness 81 y/o male presented to ED on 10/12/21 from SNF for abnormal speech. Was seen on 5/20 for dislodged suprapubic catheter and sent back to SNF without catheter placement. Admitted for urosepsis 2/2 acute cystitis. PMH: HTN, CVA with L spastic hemiplegia, CHF, COPD.   Clinical Impression   Pt in bed and reports being bed bound mostly over the past 3 months.  Increased flexor tone in the left elbow, wrist, and digits noted with some redness also seen at the anterior elbow fold.  Nursing made aware and padded Kerlix placed to help with moisture.  Rolled up washcloths also placed in the left hand to assist with passively keeping the digits open.  He currently needs mod assist for grooming and feeding tasks at bed level with total assist for supine to sit.  Max +2 for selfcare and toileting in supine rolling side to side.  Feel he will benefit from acute care OT to help increased sitting balance as well as active participation in basic selfcare tasks and self feeding.  Recommend continued OT at SNF when he is medically ready for discharge.       Recommendations for follow up therapy are one component of a multi-disciplinary discharge planning process, led by the attending physician.  Recommendations may be updated based on patient status, additional functional criteria and insurance authorization.   Follow Up Recommendations  Skilled nursing-short term rehab (<3 hours/day)    Assistance Recommended at Discharge Frequent or constant Supervision/Assistance  Patient can return home with the following Two people to help with walking and/or transfers;Two people to help with bathing/dressing/bathroom;Assistance with feeding;Assist for transportation;Assistance with cooking/housework    Functional Status Assessment  Patient has had a recent decline in  their functional status and demonstrates the ability to make significant improvements in function in a reasonable and predictable amount of time.  Equipment Recommendations  None recommended by OT       Precautions / Restrictions Precautions Precautions: Fall Precaution Comments: L UE and LE contractures Restrictions Weight Bearing Restrictions: No      Mobility Bed Mobility Overal bed mobility: Needs Assistance Bed Mobility: Supine to Sit, Sit to Supine     Supine to sit: Total assist Sit to supine: Total assist   General bed mobility comments: Pt sat EOB for 4-5 mins with max assist for sitting balance.  Increased extensor tone in BLEs with the left being most restricted.  LOB to the right and posteriorly.    Transfers                          Balance Overall balance assessment: Needs assistance Sitting-balance support: Single extremity supported, Feet unsupported Sitting balance-Leahy Scale: Zero                                     ADL either performed or assessed with clinical judgement   ADL Overall ADL's : Needs assistance/impaired Eating/Feeding: Moderate assistance;Bed level Eating/Feeding Details (indicate cue type and reason): HOB elevated Grooming: Wash/dry face;Moderate assistance;Bed level Grooming Details (indicate cue type and reason): HOB elevated Upper Body Bathing: Maximal assistance;Bed level Upper Body Bathing Details (indicate cue type and reason): HOB elevated Lower Body Bathing: Maximal assistance;+2 for physical assistance;Bed level   Upper Body Dressing : Bed level;Maximal  assistance   Lower Body Dressing: +2 for physical assistance;Maximal assistance Lower Body Dressing Details (indicate cue type and reason): supine rolling     Toileting- Clothing Manipulation and Hygiene: Maximal assistance;+2 for physical assistance         General ADL Comments: Pt in bed and reports at [facility] he needed assist for all  bathing and dressing.  Harrel Lemon was used for transfers OOB to the wheelchair, which hasn't occurred in the last 3 months per his report.  He was able to complete washing his face with mod assist and transferred to the EOB with total assist.  Increased flexor tone in the left wrist, elbow, and digits.  Noted slight skin breakdown at the elbow fold with nursing made aware and kerlex applied to help with moisture and to pad skin.  Rolled up washcloths also provided for hand placement to open the palm of his hand.     Vision Baseline Vision/History: 0 No visual deficits Ability to See in Adequate Light: 0 Adequate Patient Visual Report: No change from baseline Vision Assessment?: No apparent visual deficits     Perception Perception Perception: Within Functional Limits   Praxis Praxis Praxis: Intact    Pertinent Vitals/Pain Pain Assessment Pain Assessment: Faces Faces Pain Scale: Hurts a little bit Pain Location: left arm with stretching Pain Intervention(s): Limited activity within patient's tolerance, Repositioned     Hand Dominance Right   Extremity/Trunk Assessment Upper Extremity Assessment Upper Extremity Assessment: LUE deficits/detail LUE Deficits / Details: Pt with history of spastic LUE hemiparesis with Left elbow and wrist flexor contractures.  Digit contractures as well but able to exhibit slight extension.   Shoulder flexion limited as well with only trace digit flexion and decreased PROM to less than 30 degrees secondary to tightness and pain LUE Sensation: decreased light touch LUE Coordination: decreased fine motor;decreased gross motor   Lower Extremity Assessment Lower Extremity Assessment: Defer to PT evaluation RLE Deficits / Details: 2-/5; R knee ROM limited to 0-20 degrees of flexion. LLE Deficits / Details: 0/5 (only able to wiggle toes minimally); PF contracture with bone protruding against skin. Knee extension to 0 but unable to flex   Cervical / Trunk  Assessment Cervical / Trunk Assessment: Kyphotic   Communication Communication Communication: No difficulties   Cognition Arousal/Alertness: Awake/alert Behavior During Therapy: WFL for tasks assessed/performed Overall Cognitive Status: No family/caregiver present to determine baseline cognitive functioning                                                  Home Living Family/patient expects to be discharged to:: Skilled nursing facility                                 Additional Comments: has been at [facility]      Prior Functioning/Environment Prior Level of Function : Needs assist             Mobility Comments: hoyer lift at baseline. patient states he is able to get himself around in w/c but unsure due to contractures on L and weakness on R ADLs Comments: bed level bathing by nursing staff        OT Problem List: Decreased strength;Decreased knowledge of use of DME or AE;Impaired tone;Decreased range  of motion;Decreased coordination;Impaired UE functional use;Impaired balance (sitting and/or standing);Impaired sensation;Decreased activity tolerance      OT Treatment/Interventions: Self-care/ADL training;Patient/family education;Neuromuscular education;Balance training;Splinting;Energy conservation;Therapeutic activities;DME and/or AE instruction;Therapeutic exercise    OT Goals(Current goals can be found in the care plan section) Acute Rehab OT Goals Patient Stated Goal: Pt did not state but agreeable to participate in OT. OT Goal Formulation: With patient Time For Goal Achievement: 11/24/21 Potential to Achieve Goals: Fair  OT Frequency: Min 2X/week       AM-PAC OT "6 Clicks" Daily Activity     Outcome Measure Help from another person eating meals?: A Lot Help from another person taking care of personal grooming?: A Lot Help from another person toileting, which includes using toliet, bedpan, or urinal?:  Total Help from another person bathing (including washing, rinsing, drying)?: Total Help from another person to put on and taking off regular upper body clothing?: Total Help from another person to put on and taking off regular lower body clothing?: Total 6 Click Score: 8   End of Session Nurse Communication: Need for lift equipment  Activity Tolerance: Patient tolerated treatment well Patient left: in bed;with call bell/phone within reach  OT Visit Diagnosis: Muscle weakness (generalized) (M62.81);Feeding difficulties (R63.3);Hemiplegia and hemiparesis Hemiplegia - Right/Left: Left Hemiplegia - dominant/non-dominant: Non-Dominant Hemiplegia - caused by: Cerebral infarction                Time: 0940-1030 OT Time Calculation (min): 50 min Charges:  OT General Charges $OT Visit: 1 Visit OT Evaluation $OT Eval Moderate Complexity: 1 Mod OT Treatments $Self Care/Home Management : 23-37 mins Amauris Debois OTR/L  10/13/2021, 10:59 AM

## 2021-10-13 NOTE — Progress Notes (Signed)
Pharmacy Antibiotic Note  Gregory Kerr is a 81 y.o. male admitted on 10/12/2021 with concerns for  infection of unknown source . Patient with noted allergy to penicillins (rash), but has previously received Cefepime (February 2023). Pharmacy has been consulted for Vancomycin and Cefepime dosing.   Patient is afebrile, WBC 25.4>>17.6. Renal function improved greatly since initial dosing calculations: SCr 1.22>>0.76. Will adjust antibiotic doses accordingly.   Plan: - Increase dose of vancomycin to 1750 mg IV  q24h             (eAUC 480.9 using SCr 0.8) - Change cefepime frequency to 2 g IV q8h - Follow-up clinical status, renal function, levels as needed - Follow-up cultures, LOT, de-escalate as able   Height: '5\' 9"'$  (175.3 cm) Weight: 74.8 kg (164 lb 14.5 oz) IBW/kg (Calculated) : 70.7  Temp (24hrs), Avg:98.1 F (36.7 C), Min:97.6 F (36.4 C), Max:98.6 F (37 C)  Recent Labs  Lab 10/07/21 2355 10/08/21 0025 10/12/21 1213 10/12/21 1342 10/12/21 1622 10/13/21 0407  WBC 6.4  --  25.4*  --   --  17.6*  CREATININE  --  0.50* 1.22  --   --  0.76  LATICACIDVEN  --   --   --  3.2* 2.6*  --      Estimated Creatinine Clearance: 76.1 mL/min (by C-G formula based on SCr of 0.76 mg/dL).    Allergies  Allergen Reactions   Penicillins Rash    ++++Patient in nursing home. Unable to clarify details/severity of the reaction.++++    Antimicrobials this admission: Vancomycin 5/24 >>  Cefepime 5/24 >>  Metronidazole 5/24 x 1  Microbiology results: 5/24 BCx: ngtd 5/24 UCx: pending    Thank you for allowing pharmacy to be a part of this patient's care.  Donald Pore, PharmD Pharmacy Resident 10/13/2021, 1:25 PM   Please check AMION for all [place] phone numbers After 10:00 PM, call [place] [phone]

## 2021-10-14 DIAGNOSIS — R319 Hematuria, unspecified: Secondary | ICD-10-CM

## 2021-10-14 DIAGNOSIS — N390 Urinary tract infection, site not specified: Secondary | ICD-10-CM

## 2021-10-14 LAB — CBC
HCT: 35.2 % — ABNORMAL LOW (ref 39.0–52.0)
Hemoglobin: 11.6 g/dL — ABNORMAL LOW (ref 13.0–17.0)
MCH: 28.9 pg (ref 26.0–34.0)
MCHC: 33 g/dL (ref 30.0–36.0)
MCV: 87.8 fL (ref 80.0–100.0)
Platelets: 92 10*3/uL — ABNORMAL LOW (ref 150–400)
RBC: 4.01 MIL/uL — ABNORMAL LOW (ref 4.22–5.81)
RDW: 14.7 % (ref 11.5–15.5)
WBC: 12.3 10*3/uL — ABNORMAL HIGH (ref 4.0–10.5)
nRBC: 0 % (ref 0.0–0.2)

## 2021-10-14 LAB — COMPREHENSIVE METABOLIC PANEL
ALT: 39 U/L (ref 0–44)
AST: 37 U/L (ref 15–41)
Albumin: 2.3 g/dL — ABNORMAL LOW (ref 3.5–5.0)
Alkaline Phosphatase: 94 U/L (ref 38–126)
Anion gap: 7 (ref 5–15)
BUN: 14 mg/dL (ref 8–23)
CO2: 21 mmol/L — ABNORMAL LOW (ref 22–32)
Calcium: 8.2 mg/dL — ABNORMAL LOW (ref 8.9–10.3)
Chloride: 112 mmol/L — ABNORMAL HIGH (ref 98–111)
Creatinine, Ser: 0.63 mg/dL (ref 0.61–1.24)
GFR, Estimated: >60 mL/min (ref >60)
Glucose, Bld: 87 mg/dL (ref 70–99)
Potassium: 3.6 mmol/L (ref 3.5–5.1)
Sodium: 140 mmol/L (ref 135–145)
Total Bilirubin: 0.6 mg/dL (ref 0.3–1.2)
Total Protein: 5.3 g/dL — ABNORMAL LOW (ref 6.5–8.1)

## 2021-10-14 MED ORDER — AMOXICILLIN 500 MG PO CAPS
500.0000 mg | ORAL_CAPSULE | Freq: Three times a day (TID) | ORAL | Status: DC
  Administered 2021-10-14 – 2021-10-15 (×3): 500 mg via ORAL
  Filled 2021-10-14 (×6): qty 1

## 2021-10-14 NOTE — Progress Notes (Signed)
Family Medicine Teaching Service Daily Progress Note Intern Pager: [phone]  Patient name: Gregory Kerr Medical record number: [id number] Date of birth: 11/08/1940 Age: 81 y.o. Gender: male  Primary Care Provider: Virgel Bouquet, MD Consultants: None Code Status: Full  Pt Overview and Major Events to Date:  5/24: Admitted, code sepsis called in ED  Assessment and Plan: [name] is a 81 year-old male who presetned with likely urosepsis. 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  Urosepsis (now resolved) 2/2 lower UTI  Chronic urinary incontinence Leukocytosis improving -12.3 today from 17.6 Afebrile and hemodynamically stable Lactic acidosis resolved Urine culture unfortunately not collected, but given hx proteus/other infectious agents, will continue abx treatment but d/c broad spectrum If re-fever or change in labs, can re-collect UA/urine culture Blood cultures with no growth at 2 days Bladder scans w/o retention, pt urinating normally- 1.6L in past 24 hr - Will start Amoxicillin 500 mg TID x 4 days (for 7 days total antibiotic) - Bladder scan q shift - Strict I and O - [place] catheter  Other conditions chronic and stable Muscle spasms/chronic pain- Continue home regimen HTN- Monitor HLD- Continue home statin Hypokalemia- monitor w/ labs  FEN/GI: Regular diet per SLP PPx: Lovenox  Dispo:Pending PT recommendations  pending clinical improvement . Barriers include continued inpatient management/labs.   Subjective:  Feeling better than yesterday. Denies any complaints.   Objective: Temp:  [98.2 F (36.8 C)-98.7 F (37.1 C)] 98.5 F (36.9 C) (05/26 0450) Pulse Rate:  [75-84] 75 (05/26 0450) Resp:  [17-18] 18 (05/26 0450) BP: (111-136)/(51-71) 132/61 (05/26 0450) SpO2:  [92 %-96 %] 96 % (05/26 0450) Physical Exam: General: Elderly gentleman laying in bed in no  distress Cardiovascular: RRR, no murmurs Respiratory: Normal WOB on room air, clear lungs Abdomen: Soft, non-tender, mildly distended, bowel sounds present Extremities: Chronic contractures  Laboratory: Recent Labs  Lab 10/12/21 1213 10/13/21 0407 10/14/21 0347  WBC 25.4* 17.6* 12.3*  HGB 15.1 11.6* 11.6*  HCT 45.0 34.0* 35.2*  PLT PLATELET CLUMPS NOTED ON SMEAR, UNABLE TO ESTIMATE 81* 92*   Recent Labs  Lab 10/12/21 1213 10/13/21 0407 10/14/21 0347  NA 140 138 140  K 3.4* 3.4* 3.6  CL 111 112* 112*  CO2 19* 22 21*  BUN 38* 27* 14  CREATININE 1.22 0.76 0.63  CALCIUM 8.8* 8.1* 8.2*  PROT 6.4* 5.3* 5.3*  BILITOT 1.4* 0.8 0.6  ALKPHOS 117 90 94  ALT 58* 42 39  AST 57* 45* 37  GLUCOSE 142* 91 87      Imaging/Diagnostic Tests: No results found.   Gregory Brill, DO 10/14/2021, 8:38 AM PGY-1, [place] Intern pager: [phone], text pages welcome

## 2021-10-14 NOTE — Care Management Important Message (Signed)
Important Message  Patient Details  Name: Gregory Kerr MRN: [id number] Date of Birth: 1940/10/08   Medicare Important Message Given:  Yes     [name] 10/14/2021, 3:22 PM

## 2021-10-15 ENCOUNTER — Inpatient Hospital Stay (HOSPITAL_COMMUNITY): Payer: Medicare Other

## 2021-10-15 DIAGNOSIS — B964 Proteus (mirabilis) (morganii) as the cause of diseases classified elsewhere: Secondary | ICD-10-CM

## 2021-10-15 DIAGNOSIS — R7881 Bacteremia: Secondary | ICD-10-CM

## 2021-10-15 LAB — BLOOD CULTURE ID PANEL (REFLEXED) - BCID2

## 2021-10-15 LAB — CBC
HCT: 37.4 % — ABNORMAL LOW (ref 39.0–52.0)
Hemoglobin: 12.2 g/dL — ABNORMAL LOW (ref 13.0–17.0)
MCH: 28.6 pg (ref 26.0–34.0)
MCHC: 32.6 g/dL (ref 30.0–36.0)
MCV: 87.6 fL (ref 80.0–100.0)
Platelets: 110 10*3/uL — ABNORMAL LOW (ref 150–400)
RBC: 4.27 MIL/uL (ref 4.22–5.81)
RDW: 14.4 % (ref 11.5–15.5)
WBC: 8 10*3/uL (ref 4.0–10.5)
nRBC: 0 % (ref 0.0–0.2)

## 2021-10-15 MED ORDER — WHITE PETROLATUM EX OINT
TOPICAL_OINTMENT | CUTANEOUS | Status: DC | PRN
  Administered 2021-10-15: 1 via TOPICAL
  Filled 2021-10-15: qty 28.35

## 2021-10-15 MED ORDER — CEFTRIAXONE SODIUM 2 G IJ SOLR
2.0000 g | INTRAMUSCULAR | Status: DC
  Administered 2021-10-15 – 2021-10-16 (×2): 2 g via INTRAVENOUS
  Filled 2021-10-15 (×2): qty 20

## 2021-10-15 NOTE — Progress Notes (Signed)
PHARMACY - PHYSICIAN COMMUNICATION CRITICAL VALUE ALERT - BLOOD CULTURE IDENTIFICATION (BCID)  Gregory Kerr is an 81 y.o. male who presented to [facility] on 10/12/2021 with a chief complaint of likely urosepsis infection  Assessment:  Proteus now growing in one of 4 blood culture bottles with probable urinary source.   Name of physician (or Provider) Kandice RobinsonsLeodis Rains, MD  Current antibiotics: Amoxil PO  Changes to prescribed antibiotics recommended:  Recommendations accepted by provider Change to Ceftriaxone 2gm IV q24h D/C amoxil PO  Results for orders placed or performed during the hospital encounter of 10/12/21  Blood Culture ID Panel (Reflexed) (Collected: 10/12/2021  1:27 PM)  Result Value Ref Range   Enterococcus faecalis NOT DETECTED NOT DETECTED   Enterococcus Faecium NOT DETECTED NOT DETECTED   Listeria monocytogenes NOT DETECTED NOT DETECTED   Staphylococcus species NOT DETECTED NOT DETECTED   Staphylococcus aureus (BCID) NOT DETECTED NOT DETECTED   Staphylococcus epidermidis NOT DETECTED NOT DETECTED   Staphylococcus lugdunensis NOT DETECTED NOT DETECTED   Streptococcus species NOT DETECTED NOT DETECTED   Streptococcus agalactiae NOT DETECTED NOT DETECTED   Streptococcus pneumoniae NOT DETECTED NOT DETECTED   Streptococcus pyogenes NOT DETECTED NOT DETECTED   A.calcoaceticus-baumannii NOT DETECTED NOT DETECTED   Bacteroides fragilis NOT DETECTED NOT DETECTED   Enterobacterales DETECTED (A) NOT DETECTED   Enterobacter cloacae complex NOT DETECTED NOT DETECTED   Escherichia coli NOT DETECTED NOT DETECTED   Klebsiella aerogenes NOT DETECTED NOT DETECTED   Klebsiella oxytoca NOT DETECTED NOT DETECTED   Klebsiella pneumoniae NOT DETECTED NOT DETECTED   Proteus species DETECTED (A) NOT DETECTED   Salmonella species NOT DETECTED NOT DETECTED   Serratia marcescens NOT DETECTED NOT DETECTED   Haemophilus influenzae NOT DETECTED NOT DETECTED   Neisseria meningitidis  NOT DETECTED NOT DETECTED   Pseudomonas aeruginosa NOT DETECTED NOT DETECTED   Stenotrophomonas maltophilia NOT DETECTED NOT DETECTED   Candida albicans NOT DETECTED NOT DETECTED   Candida auris NOT DETECTED NOT DETECTED   Candida glabrata NOT DETECTED NOT DETECTED   Candida krusei NOT DETECTED NOT DETECTED   Candida parapsilosis NOT DETECTED NOT DETECTED   Candida tropicalis NOT DETECTED NOT DETECTED   Cryptococcus neoformans/gattii NOT DETECTED NOT DETECTED   CTX-M ESBL NOT DETECTED NOT DETECTED   Carbapenem resistance IMP NOT DETECTED NOT DETECTED   Carbapenem resistance KPC NOT DETECTED NOT DETECTED   Carbapenem resistance NDM NOT DETECTED NOT DETECTED   Carbapenem resist OXA 48 LIKE NOT DETECTED NOT DETECTED   Carbapenem resistance VIM NOT DETECTED NOT DETECTED    Helaine Yackel A. Levada Dy, PharmD, BCPS, FNKF Clinical Pharmacist [place] Please utilize Amion for appropriate phone number to reach the unit pharmacist ([place])  10/15/2021  2:15 PM

## 2021-10-15 NOTE — Progress Notes (Signed)
FPTS Brief Progress Note  S: Sleeping comfortably    O: BP (!) 159/75   Pulse 74   Temp 98.6 F (37 C) (Oral)   Resp 19   Ht '5\' 9"'$  (1.753 m)   Wt 74.8 kg   SpO2 92%   BMI 24.35 kg/m     A/P:  Urosepsis, resolved Abx narrowed to Amoxicillin today. Blood cultures without growth. Urine cultures not collected. VSS, remains afebrile.  - Continue current management  - Orders reviewed. Labs for AM ordered, which was adjusted as needed.    Shary Key, DO 10/15/2021, 1:56 AM PGY-2, [place] [facility] Night Resident  Please page [phone] with questions.

## 2021-10-15 NOTE — Progress Notes (Signed)
Family Medicine Teaching Service Daily Progress Note Intern Pager: [phone]  Patient name: Gregory Kerr Medical record number: [id number] Date of birth: 1940/11/02 Age: 81 y.o. Gender: male  Primary Care Provider: Virgel Bouquet, MD Consultants: None Code Status: Full  Pt Overview and Major Events to Date:  5/24 admitted, ED called code sepsis  Antibiotics summary: 5/24 Flagyl 5/24 - 5/25 vancomycin 5/24 - 5/26 cefepime 5/26 - 5/30 amoxicillin  Assessment and Plan: Mr. Dowe is a 81 year old male who presented with sepsis secondary to UTI.  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   Proteus bacteremia  Blood cultures collected 5/24 resulted today with gram-negative rods, found to be Proteus species.  Discontinue amoxicillin, start ceftriaxone per pharmacy.  We will watch fever curve closely.  Respiratory distress Patient feels as though he is breathing faster and more shallowly than before.  SPO2 normal on room air.  Physical exam unremarkable. - CXR - IS every hour while awake  Sepsis secondary to UTI (resolved) Overall improving with antibiotics and fluids.  Patient afebrile last 24 hours, vital signs stable.  WBC continues to downtrend, now 8.  Blood culture no growth 3 days.  No urinary retention on bladder scans.  - DC bladder scans - DC urine culture order, was never collected   Other conditions chronic and stable Muscle spasms/chronic pain- Continue home regimen HTN- Monitor HLD- Continue home statin Hypokalemia- monitor w/ labs  FEN/GI: Regular diet PPx: Lovenox every 24 Dispo:SNF  as soon as improved .   Subjective:  Patient laying in bed, no distress.  He reports feeling his breathing faster and more shallowly than before.  He feels generally weak.  Does not feel like he is getting much better.  Objective: Temp:  [97.7 F (36.5 C)-98.6 F (37 C)] 97.7 F (36.5 C)  (05/27 0906) Pulse Rate:  [67-74] 67 (05/27 0906) Resp:  [17-19] 18 (05/27 0906) BP: (137-159)/(63-75) 137/72 (05/27 0906) SpO2:  [92 %-96 %] 93 % (05/27 0906) Physical Exam: General: Awake, alert, no acute distress Cardiovascular: Regular rate and rhythm, no murmurs Respiratory: CTA B, patient breathing shallowly but at regular rhythm, rate somewhat elevated to 22 Abdomen: Nondistended  Laboratory: Recent Labs  Lab 10/13/21 0407 10/14/21 0347 10/15/21 0826  WBC 17.6* 12.3* 8.0  HGB 11.6* 11.6* 12.2*  HCT 34.0* 35.2* 37.4*  PLT 81* 92* 110*   Recent Labs  Lab 10/12/21 1213 10/13/21 0407 10/14/21 0347  NA 140 138 140  K 3.4* 3.4* 3.6  CL 111 112* 112*  CO2 19* 22 21*  BUN 38* 27* 14  CREATININE 1.22 0.76 0.63  CALCIUM 8.8* 8.1* 8.2*  PROT 6.4* 5.3* 5.3*  BILITOT 1.4* 0.8 0.6  ALKPHOS 117 90 94  ALT 58* 42 39  AST 57* 45* 37  GLUCOSE 142* 91 87   Imaging/Diagnostic Tests: None last 24 hours.   Ezequiel Essex, MD 10/15/2021, 9:45 AM PGY-2, [place] Intern pager: [phone], text pages welcome

## 2021-10-16 DIAGNOSIS — R7881 Bacteremia: Secondary | ICD-10-CM | POA: Diagnosis not present

## 2021-10-16 DIAGNOSIS — B964 Proteus (mirabilis) (morganii) as the cause of diseases classified elsewhere: Secondary | ICD-10-CM | POA: Diagnosis not present

## 2021-10-16 LAB — BASIC METABOLIC PANEL
Anion gap: 6 (ref 5–15)
BUN: 10 mg/dL (ref 8–23)
CO2: 24 mmol/L (ref 22–32)
Calcium: 8.6 mg/dL — ABNORMAL LOW (ref 8.9–10.3)
Chloride: 110 mmol/L (ref 98–111)
Creatinine, Ser: 0.61 mg/dL (ref 0.61–1.24)
GFR, Estimated: >60 mL/min (ref >60)
Glucose, Bld: 93 mg/dL (ref 70–99)
Potassium: 3.5 mmol/L (ref 3.5–5.1)
Sodium: 140 mmol/L (ref 135–145)

## 2021-10-16 LAB — CBC
HCT: 39.8 % (ref 39.0–52.0)
Hemoglobin: 12.9 g/dL — ABNORMAL LOW (ref 13.0–17.0)
MCH: 28.2 pg (ref 26.0–34.0)
MCHC: 32.4 g/dL (ref 30.0–36.0)
MCV: 86.9 fL (ref 80.0–100.0)
Platelets: 115 10*3/uL — ABNORMAL LOW (ref 150–400)
RBC: 4.58 MIL/uL (ref 4.22–5.81)
RDW: 14.3 % (ref 11.5–15.5)
WBC: 9 10*3/uL (ref 4.0–10.5)
nRBC: 0 % (ref 0.0–0.2)

## 2021-10-16 NOTE — Progress Notes (Addendum)
Family Medicine Teaching Service Daily Progress Note Intern Pager: [phone]  Patient name: Gregory Kerr Medical record number: [id number] Date of birth: 06-05-1940 Age: 81 y.o. Gender: male  Primary Care Provider: Virgel Bouquet, MD Consultants: None Code Status: Full  Pt Overview and Major Events to Date:  5/24- Admitted, Code sepsis called in ED  Antibiotic summary: 5/25: Flagyl 5/24-5/25: Vancomycin 5/24-5/26: Cefepime 5/26-5/27: Amoxicillin 5/27- current: CTX  Assessment and Plan: [name] is a 81 year-old male who presented with urosepsis. 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 Pneumonia ORA, no dyspnea. CTX for resp organism coverage as well as bacteremia - IS every hour while awake - Monitor SpO2  Other conditions chronic and stable Muscle spasms/ chronic pain- Continue home regimen HTN- Monitor HLD- Continue home statin Hypokalemia- Monitor w/ labs  FEN/GI: Regular diet PPx: Lovenox q24h Dispo:SNF pending clinical improvement . Barriers include IV antibiotics.   Subjective:  Feeling oka  Objective: Temp:  [97.7 F (36.5 C)-98.6 F (37 C)] 98.2 F (36.8 C) (05/28 0502) Pulse Rate:  [64-76] 64 (05/28 0502) Resp:  [18-20] 18 (05/28 0502) BP: (136-171)/(66-82) 136/82 (05/28 0502) SpO2:  [92 %-94 %] 94 % (05/28 0502) Physical Exam: General: Elderly gentleman laying in bed in no distress Cardiovascular: RRR, no murmurs Respiratory: Clear in all fields, normal WOB Abdomen: Soft, NTND, bowel sounds presnt Extremities: Chronic contractures  Laboratory: Recent Labs  Lab 10/14/21 0347 10/15/21 0826 10/16/21 0343  WBC 12.3* 8.0 9.0  HGB 11.6* 12.2* 12.9*  HCT 35.2* 37.4*  39.8  PLT 92* 110* 115*   Recent Labs  Lab 10/12/21 1213 10/13/21 0407 10/14/21 0347 10/16/21 0343  NA 140 138 140 140  K 3.4* 3.4* 3.6 3.5  CL 111 112* 112* 110  CO2 19* 22 21* 24  BUN 38* 27* 14 10  CREATININE 1.22 0.76 0.63 0.61  CALCIUM 8.8* 8.1* 8.2* 8.6*  PROT 6.4* 5.3* 5.3*  --   BILITOT 1.4* 0.8 0.6  --   ALKPHOS 117 90 94  --   ALT 58* 42 39  --   AST 57* 45* 37  --   GLUCOSE 142* 91 87 93    Imaging/Diagnostic Tests: DG Chest 2 View  Result Date: 10/15/2021 CLINICAL DATA:  Dyspnea. EXAM: CHEST - 2 VIEW COMPARISON:  AP chest 06/28/2021; CT chest 11/19/2020; chest two views 02/27/2016 FINDINGS: The patient has a contracted left arm and was unable to lift the arm for the lateral view. The best possible images were obtained. Cardiac silhouette is again mildly enlarged. Mediastinal contours are within normal limits. Moderate calcification is again seen within aortic arch. Moderately decreased lung volumes. Bibasilar bronchovascular crowding. There is a left retrocardiac opacity. This was air-filled on 06/28/2021 radiographs. It now appears more similar to 02/27/2016 frontal view. This appears to represent the known large sliding hiatal hernia likely with internal fluid density within the supra diaphragmatic gastric lumen causing the opacity. There does appear to be blunting of the left costophrenic angle and small left pleural effusion. There is likely additional left basilar subsegmental atelectasis versus early pneumonia. Mild dextrocurvature of the thoracic spine. Moderate multilevel degenerative disc changes. IMPRESSION: There is a chronic left retrocardiac opacity that likely corresponds to the patient's known large hiatal  hernia now with more fluid within the stomach rather than air is previously seen. Additionally there does appear to be a small left pleural effusion, and cannot exclude left basilar atelectasis versus pneumonia. Electronically Signed   By: Yvonne Kendall M.D.    On: 10/15/2021 15:12     Orvis Brill, DO 10/16/2021, 8:33 AM PGY-1, [place] Intern pager: [phone], text pages welcome

## 2021-10-16 NOTE — Progress Notes (Signed)
Family Medicine Teaching Service Daily Progress Note Intern Pager: [phone]  Patient name: Gregory Kerr Medical record number: [id number] Date of birth: 11-Jan-1941 Age: 81 y.o. Gender: male  Primary Care Provider: Virgel Bouquet, MD Consultants: None Code Status: Full  Pt Overview and Major Events to Date:  5/24- Admitted, Code sepsis called in ED   Antibiotic summary: 5/25: Flagyl 5/24-5/25: Vancomycin 5/24-5/26: Cefepime 5/26-5/27: Amoxicillin 5/27- current: CTX  Assessment and Plan: Gregory Kerr is a 81 year-old male who presented with urosepsis. 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 VSS patient remains afebrile  - continue IV CTX, awaiting sensitivities  - monitor fever curve  Pneumonia vs atelectasis Breathing well on RA. Currently on CTX  - spirometry qh while awake - monitor O2 sats   Other conditions chronic and stable L sided hemiparesis, R leg weakness, stable Muscle spasms/ chronic pain- Continue home regimen HTN- Monitor HLD- Continue home statin Hypokalemia- Monitor w/ labs L sided hemiparesis, R leg weakness, stable   FEN/GI: Regular diet PPx: Lovenox every 24 hours  Disposition: SNF pending clinical improvement.  Barriers: On IV antibiotics  Subjective:  No acute events overnight. Patient denies any concerns or complaints.   Objective: Temp:  [97.9 F (36.6 C)-98.8 F (37.1 C)] 98.1 F (36.7 C) (05/29 0450) Pulse Rate:  [64-79] 70 (05/29 0450) Resp:  [16-17] 17 (05/29 0450) BP: (137-160)/(71-89) 155/89 (05/29 0450) SpO2:  [91 %-96 %] 96 % (05/29 0450) Physical Exam: General: laying in bed, NAD Cardiovascular: RRR no murmurs  Respiratory: CTAB normal WOB Abdomen: soft, NTND Extremities: chronic contractures   Laboratory: Recent Labs  Lab 10/15/21 0826 10/16/21 0343 10/17/21 0154  WBC 8.0 9.0 8.3   HGB 12.2* 12.9* 13.5  HCT 37.4* 39.8 40.3  PLT 110* 115* 131*   Recent Labs  Lab 10/12/21 1213 10/13/21 0407 10/14/21 0347 10/16/21 0343  NA 140 138 140 140  K 3.4* 3.4* 3.6 3.5  CL 111 112* 112* 110  CO2 19* 22 21* 24  BUN 38* 27* 14 10  CREATININE 1.22 0.76 0.63 0.61  CALCIUM 8.8* 8.1* 8.2* 8.6*  PROT 6.4* 5.3* 5.3*  --   BILITOT 1.4* 0.8 0.6  --   ALKPHOS 117 90 94  --   ALT 58* 42 39  --   AST 57* 45* 37  --   GLUCOSE 142* 91 87 93    Imaging/Diagnostic Tests: None new  Shary Key, DO 10/17/2021, 6:02 AM PGY-2, [place] Intern pager: [phone], text pages welcome

## 2021-10-16 NOTE — Progress Notes (Signed)
FPTS Interim Progress Note  S:Went to bedside to check on patient, he was resting comfortably so I did not disturb him.   O: BP (!) 143/66 (BP Location: Right Arm)   Pulse 70   Temp 98.5 F (36.9 C) (Oral)   Resp 20   Ht '5\' 9"'$  (1.753 m)   Wt 74.8 kg   SpO2 93%   BMI 24.35 kg/m   General: Patient resting comfortably, in no acute distress. Resp: normal work of breathing with normal chest rise and fall  A/P: Patient admitted with urosepsis, cultures notable for proteus so antibiotics broadened to ceftriaxone. Vitals reviewed and stable. Remainder of plan per day team.   Donney Dice, DO 10/16/2021, 2:36 AM PGY-2, [place] Medicine Service pager [phone]

## 2021-10-17 DIAGNOSIS — R7881 Bacteremia: Secondary | ICD-10-CM | POA: Diagnosis not present

## 2021-10-17 DIAGNOSIS — B964 Proteus (mirabilis) (morganii) as the cause of diseases classified elsewhere: Secondary | ICD-10-CM | POA: Diagnosis not present

## 2021-10-17 LAB — CBC
HCT: 40.3 % (ref 39.0–52.0)
Hemoglobin: 13.5 g/dL (ref 13.0–17.0)
MCH: 28.8 pg (ref 26.0–34.0)
MCHC: 33.5 g/dL (ref 30.0–36.0)
MCV: 85.9 fL (ref 80.0–100.0)
Platelets: 131 10*3/uL — ABNORMAL LOW (ref 150–400)
RBC: 4.69 MIL/uL (ref 4.22–5.81)
RDW: 14.3 % (ref 11.5–15.5)
WBC: 8.3 10*3/uL (ref 4.0–10.5)
nRBC: 0 % (ref 0.0–0.2)

## 2021-10-17 LAB — CULTURE, BLOOD (ROUTINE X 2)
Culture: NO GROWTH
Special Requests: ADEQUATE
Special Requests: ADEQUATE

## 2021-10-17 MED ORDER — DARIFENACIN HYDROBROMIDE ER 7.5 MG PO TB24
7.5000 mg | ORAL_TABLET | Freq: Every day | ORAL | Status: DC
  Administered 2021-10-17 – 2021-10-18 (×2): 7.5 mg via ORAL
  Filled 2021-10-17 (×3): qty 1

## 2021-10-17 MED ORDER — TAMSULOSIN HCL 0.4 MG PO CAPS
0.4000 mg | ORAL_CAPSULE | Freq: Every day | ORAL | Status: DC
  Administered 2021-10-17 – 2021-10-18 (×2): 0.4 mg via ORAL
  Filled 2021-10-17 (×2): qty 1

## 2021-10-17 MED ORDER — AMOXICILLIN 500 MG PO CAPS
500.0000 mg | ORAL_CAPSULE | Freq: Two times a day (BID) | ORAL | Status: DC
  Administered 2021-10-17 – 2021-10-18 (×4): 500 mg via ORAL
  Filled 2021-10-17 (×5): qty 1

## 2021-10-17 MED ORDER — SERTRALINE HCL 100 MG PO TABS
100.0000 mg | ORAL_TABLET | Freq: Every day | ORAL | Status: DC
Start: 2021-10-17 — End: 2021-10-19
  Administered 2021-10-17 – 2021-10-18 (×2): 100 mg via ORAL
  Filled 2021-10-17 (×2): qty 1

## 2021-10-17 NOTE — Progress Notes (Signed)
FPTS Brief Note Reviewed patient's vitals, recent notes.  Vitals:   10/16/21 1809 10/16/21 2043  BP: (!) 156/74 (!) 160/71  Pulse: 70 64  Resp: 16 16  Temp: 98.8 F (37.1 C) 97.9 F (36.6 C)  SpO2: 92% 91%   At this time, no change in plan from day progress note.  Shary Key, DO Page [phone] with questions about this patient.

## 2021-10-17 NOTE — Plan of Care (Signed)
  Problem: Health Behavior/Discharge Planning: Goal: Ability to manage health-related needs will improve Outcome: Progressing   Problem: Clinical Measurements: Goal: Ability to maintain clinical measurements within normal limits will improve Outcome: Progressing Goal: Will remain free from infection Outcome: Progressing   Problem: Safety: Goal: Ability to remain free from injury will improve Outcome: Progressing

## 2021-10-17 NOTE — TOC Progression Note (Signed)
Transition of Care [facility]) - Initial/Assessment Note    Patient Details  Name: Gregory Kerr MRN: [id number] Date of Birth: 1941-04-03  Transition of Care [facility]) CM/SW Contact:    Milinda Antis, [place] Phone Number: 10/17/2021, 1:50 PM  Clinical Narrative:                 Patient is LTC at [facility] and can return when medically ready.    TOC will continue to follow.   Expected Discharge Plan: Skilled Nursing Facility Barriers to Discharge: Continued Medical Work up   Patient Goals and CMS Choice Patient states their goals for this hospitalization and ongoing recovery are:: To return to facility CMS Medicare.gov Compare Post Acute Care list provided to:: Patient Choice offered to / list presented to : Patient  Expected Discharge Plan and Services Expected Discharge Plan: [place] arrangements for the past 2 months: [place]                                      Prior Living Arrangements/Services Living arrangements for the past 2 months: [place] Lives with:: Facility Resident Patient language and need for interpreter reviewed:: Yes Do you feel safe going back to the place where you live?: Yes      Need for Family Participation in Patient Care: No (Comment) Care giver support system in place?: Yes (comment)   Criminal Activity/Legal Involvement Pertinent to Current Situation/Hospitalization: No - Comment as needed  Activities of Daily Living Home Assistive Devices/Equipment: Wheelchair ADL Screening (condition at time of admission) Patient's cognitive ability adequate to safely complete daily activities?: No Is the patient deaf or have difficulty hearing?: No Does the patient have difficulty seeing, even when wearing glasses/contacts?: No Does the patient have difficulty concentrating, remembering, or making decisions?: No Patient able to express need for assistance with ADLs?: Yes Does the  patient have difficulty dressing or bathing?: Yes Independently performs ADLs?: No Communication: Independent Dressing (OT): Dependent Is this a change from baseline?: Pre-admission baseline Grooming: Dependent Is this a change from baseline?: Pre-admission baseline Feeding: Dependent Is this a change from baseline?: Pre-admission baseline Bathing: Dependent Is this a change from baseline?: Pre-admission baseline Toileting: Dependent Is this a change from baseline?: Pre-admission baseline In/Out Bed: Dependent Is this a change from baseline?: Pre-admission baseline Walks in Home: Dependent Is this a change from baseline?: Pre-admission baseline Does the patient have difficulty walking or climbing stairs?: Yes Weakness of Legs: Left Weakness of Arms/Hands: Left  Permission Sought/Granted   Permission granted to share information with : Yes, Verbal Permission Granted     Permission granted to share info w AGENCY: SNF        Emotional Assessment Appearance:: Appears stated age Attitude/Demeanor/Rapport: Gracious Affect (typically observed): Accepting, Pleasant Orientation: : Oriented to Self, Oriented to Place, Oriented to Situation Alcohol / Substance Use: Not Applicable Psych Involvement: No (comment)  Admission diagnosis:  Lactic acidosis [E87.20] Hoarseness of voice [R49.0] AKI (acute kidney injury) ([place]) [N17.9] Sepsis ([place]) [A41.9] Urinary tract infection with hematuria, site unspecified [N39.0, R31.9] Sepsis with acute renal failure without septic shock, due to unspecified organism, unspecified acute renal failure type ([place]) [A41.9, R65.20, N17.9] Patient Active Problem List   Diagnosis Date Noted   Bacteremia due to Proteus species    Acute renal failure superimposed on stage 3a chronic kidney disease ([place])  06/29/2021   Bladder spasm 06/29/2021   Sepsis ([place]) 06/28/2021   Spasticity 06/28/2021   Prolonged QT interval 06/28/2021   Constipation 06/28/2021    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 03/08/2021   Weakness    Acute CVA (cerebrovascular accident) ([place]) 11/19/2020   Allergic rhinitis due to allergen 04/27/2016   Chest pain 02/27/2016   Thoracic ascending aortic aneurysm ([place]) 02/27/2016   Leukocytosis 02/27/2016   Major depression, chronic 02/16/2016   Neuropathic pain 02/16/2016   D (diarrhea) 12/29/2015   Coronary artery disease involving native coronary artery of native heart with angina pectoris ([place]) 11/12/2015   History of CVA with residual deficit 10/14/2015   Hyperlipidemia 07/16/2015   Hypertensive heart disease with congestive heart failure ([place]) 12/10/2014   Angina pectoris ([place]) 12/10/2014   Chronic obstructive pulmonary disease ([place]) 12/10/2014   Irritable bowel syndrome with diarrhea 12/10/2014   Senile osteoporosis 12/10/2014   CVA (cerebral vascular accident) ([place]) 12/10/2014   Acute on chronic diastolic CHF (congestive heart failure) ([place]) 09/27/2014   Hypokalemia 09/23/2014   Pancreatitis, gallstone 09/22/2014   Chronic diastolic heart failure ([place]) 09/22/2014   Acute cholangitis 09/22/2014   Gallstone pancreatitis 09/22/2014   Hiatal hernia with GERD 08/31/2014   Vitamin D deficiency 08/03/2014   Hyperlipidemia LDL goal <100 08/03/2014   CVA, old, hemiparesis ([place]) 08/03/2014   Leg edema, left 08/03/2014   Gastroesophageal reflux disease without esophagitis 06/18/2014   Essential hypertension, benign 06/18/2014   Primary generalized hypertrophic osteoarthrosis 06/18/2014   Left spastic hemiplegia ([place]) 06/18/2014   UTI (urinary tract infection) 04/04/2014   Essential hypertension 04/03/2014   Chronic airway obstruction, not elsewhere classified 01/15/2014   Dermatophytosis of nail 12/18/2013   Unspecified essential hypertension 12/18/2013   Generalized osteoarthrosis, unspecified site 12/18/2013   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   GERD (gastroesophageal reflux disease) 08/11/2013   Disuse  osteoporosis 08/11/2013   Chronic pain syndrome 01/30/2013   IBS (irritable bowel syndrome) 01/30/2013   Cerebral infarct ([place]) 10/24/2012   HTN (hypertension) 10/24/2012   Osteoporosis 10/24/2012   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   Other and unspecified hyperlipidemia 10/24/2012   Hiatal hernia 10/24/2012   PCP:  Virgel Bouquet, MD Pharmacy:  No Pharmacies Listed    Social Determinants of Health (SDOH) Interventions    Readmission Risk Interventions    10/13/2021   12:30 PM  Readmission Risk Prevention Plan  Transportation Screening Complete  Medication Review (RN Care Manager) Referral to Pharmacy  PCP or Specialist appointment within 3-5 days of discharge Not Complete  PCP/Specialist Appt Not Complete comments Still inpatient without discharge date at this time  [place] or [place] Not Complete  HRI or Home Care Consult Pt Refusal Comments Patient is LTC at [facility]  SW Recovery Care/Counseling Consult Complete  Palliative Care Screening Not Applicable  Skilled Nursing Facility Complete

## 2021-10-18 ENCOUNTER — Other Ambulatory Visit: Payer: Self-pay

## 2021-10-18 DIAGNOSIS — B964 Proteus (mirabilis) (morganii) as the cause of diseases classified elsewhere: Secondary | ICD-10-CM | POA: Diagnosis not present

## 2021-10-18 DIAGNOSIS — R7881 Bacteremia: Secondary | ICD-10-CM | POA: Diagnosis not present

## 2021-10-18 LAB — CBC
HCT: 42.8 % (ref 39.0–52.0)
Hemoglobin: 13.9 g/dL (ref 13.0–17.0)
MCH: 28.2 pg (ref 26.0–34.0)
MCHC: 32.5 g/dL (ref 30.0–36.0)
MCV: 86.8 fL (ref 80.0–100.0)
Platelets: 156 10*3/uL (ref 150–400)
RBC: 4.93 MIL/uL (ref 4.22–5.81)
RDW: 14.3 % (ref 11.5–15.5)
WBC: 7 10*3/uL (ref 4.0–10.5)
nRBC: 0 % (ref 0.0–0.2)

## 2021-10-18 MED ORDER — AMOXICILLIN 500 MG PO CAPS: 500.0000 mg | ORAL_CAPSULE | Freq: Two times a day (BID) | ORAL | Status: AC

## 2021-10-18 NOTE — Progress Notes (Signed)
Family Medicine Teaching Service Daily Progress Note Intern Pager: [phone]  Patient name: Gregory Kerr Medical record number: [id number] Date of birth: Nov 02, 1940 Age: 81 y.o. Gender: male  Primary Care Provider: Virgel Bouquet, MD Consultants: None Code Status: Full  Pt Overview and Major Events to Date:  5/24: Admitted, Code sepsis called in ED  Antibiotic summary: 5/25: Flagyl 5/24-5/25: Vancomycin 5/24-5/26: Cefepime 5/26-5/27: Amoxicillin 5/27- current: CTX  Assessment and Plan: Gregory Kerr is a 81 year-old male who presented with urosepsis. 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  Proteus bacteremia On PO Amoxicillin, doing well. Afebrile. Hemodynamically stable Requesting a condom cath for d/c to SNF- will place DME order to try and get this - Last day of abx today - Monitor fever curve  Possible atelectasis vs. PNA  ORA, no dyspnea.  - Monitor SpO2  Other conditions chronic and stable Muscle spasms/ chronic pain- Continue home regimen HTN- Monitor HLD- Continue home statin  FEN/GI: Regular diet PPx: Lovenox q24h Dispo: ALF  pending clinical improvement .   Subjective:  Feeling okay.   Objective: Temp:  [98 F (36.7 C)-98.3 F (36.8 C)] 98.2 F (36.8 C) (05/30 0449) Pulse Rate:  [73-100] 86 (05/30 0449) Resp:  [16-18] 18 (05/30 0449) BP: (143-177)/(69-79) 143/73 (05/30 0449) SpO2:  [92 %-95 %] 93 % (05/30 0449) Physical Exam: General: Elderly gentleman in no distress. Speaking softly Cardiovascular: RRR, no murmurs Respiratory: Clear in all fields, normal WOB Abdomen: Soft, NTND Extremities: Chronic contractures  Laboratory: Recent Labs  Lab 10/16/21 0343 10/17/21 0154 10/18/21 0056  WBC 9.0 8.3 7.0  HGB 12.9* 13.5 13.9  HCT 39.8 40.3 42.8  PLT 115* 131* 156   Recent Labs  Lab 10/12/21 1213 10/13/21 0407 10/14/21 0347 10/16/21 0343  NA 140 138 140  140  K 3.4* 3.4* 3.6 3.5  CL 111 112* 112* 110  CO2 19* 22 21* 24  BUN 38* 27* 14 10  CREATININE 1.22 0.76 0.63 0.61  CALCIUM 8.8* 8.1* 8.2* 8.6*  PROT 6.4* 5.3* 5.3*  --   BILITOT 1.4* 0.8 0.6  --   ALKPHOS 117 90 94  --   ALT 58* 42 39  --   AST 57* 45* 37  --   GLUCOSE 142* 91 87 93    Imaging/Diagnostic Tests: No results found.   Orvis Brill, DO 10/18/2021, 9:17 AM PGY-1, [place] Intern pager: [phone], text pages welcome

## 2021-10-18 NOTE — TOC Transition Note (Signed)
Transition of Care [facility]) - CM/SW Discharge Note   Patient Details  Name: Gregory Kerr MRN: [id number] Date of Birth: 1940-06-18  Transition of Care [facility]) CM/SW Contact:  Milinda Antis, [place] Phone Number: 10/18/2021, 2:28 PM   Clinical Narrative:    Patient will DC to: [facility] Anticipated DC date:  10/18/2021 Family notified:  Yes Transport by: Corey Harold   Per MD patient ready for DC to SNF. RN to call report prior to discharge [phone] room 105. RN, patient, patient's family, and facility notified of DC. Discharge Summary and FL2 sent to facility. DC packet on chart. Ambulance transport requested for patient.    CSW will sign off for now as social work intervention is no longer needed. Please consult [place] again if new needs arise.     Final next level of care: Skilled Nursing Facility Barriers to Discharge: Barriers Resolved   Patient Goals and CMS Choice Patient states their goals for this hospitalization and ongoing recovery are:: To return to facility CMS Medicare.gov Compare Post Acute Care list provided to:: Patient Choice offered to / list presented to : Patient  Discharge Placement              Patient chooses bed at:  [facility]) Patient to be transferred to facility by: [place] Name of family member notified: [name],[name] (Sister)   [phone] Patient and family notified of of transfer: 10/18/21  Discharge Plan and Services                                     Social Determinants of Health ([place]) Interventions     Readmission Risk Interventions    10/13/2021   12:30 PM  Readmission Risk Prevention Plan  Transportation Screening Complete  Medication Review ([place]) Referral to Pharmacy  PCP or Specialist appointment within 3-5 days of discharge Not Complete  PCP/Specialist Appt Not Complete comments Still inpatient without discharge date at this time  [place] or [place] Not Complete  HRI or Home Care Consult Pt  Refusal Comments Patient is LTC at [facility]  SW Recovery Care/Counseling Consult Complete  [employer] Complete

## 2021-10-18 NOTE — Plan of Care (Signed)
  Problem: Acute Rehab OT Goals (only OT should resolve) Goal: Pt. Will Perform Eating Outcome: Adequate for Discharge Goal: Pt. Will Perform Grooming Outcome: Adequate for Discharge Goal: Pt. Will Perform Upper Body Bathing Outcome: Adequate for Discharge Goal: OT Additional ADL Goal #1 Outcome: Adequate for Discharge   Problem: Health Behavior/Discharge Planning: Goal: Ability to manage health-related needs will improve Outcome: Adequate for Discharge   Problem: Clinical Measurements: Goal: Ability to maintain clinical measurements within normal limits will improve Outcome: Adequate for Discharge Goal: Will remain free from infection Outcome: Adequate for Discharge   Problem: Safety: Goal: Ability to remain free from injury will improve Outcome: Adequate for Discharge   Problem: Skin Integrity: Goal: Risk for impaired skin integrity will decrease Outcome: Adequate for Discharge   Problem: Fluid Volume: Goal: Hemodynamic stability will improve Outcome: Adequate for Discharge   Problem: Clinical Measurements: Goal: Diagnostic test results will improve Outcome: Adequate for Discharge Goal: Signs and symptoms of infection will decrease Outcome: Adequate for Discharge   Problem: Respiratory: Goal: Ability to maintain adequate ventilation will improve Outcome: Adequate for Discharge

## 2021-10-18 NOTE — Discharge Summary (Signed)
[facility] Discharge Summary  Patient name: Gregory Kerr Medical record number: [id number] Date of birth: 01/19/1941 Age: 81 y.o. Gender: male Date of Admission: 10/12/2021  Date of Discharge: 10/18/2021 Admitting Physician: Shary Key, DO  Primary Care Provider: Virgel Bouquet, MD Consultants: None  Indication for Hospitalization: Weakness  Discharge Diagnoses/Problem List:  Principal Problem:   Sepsis [facility]) Active Problems:   Bacteremia due to Proteus species    Disposition: ALF  Discharge Condition: Stable  Discharge Exam:  Blood pressure 121/71, pulse 81, temperature 98.2 F (36.8 C), temperature source Oral, resp. rate 18, height '5\' 9"'$  (1.753 m), weight 74.8 kg, SpO2 94 %.  General: Elderly gentleman in no distress. Speaking softly Cardiovascular: RRR, no murmurs Respiratory: Clear in all fields, normal WOB Abdomen: Soft, NTND Extremities: Chronic contractures  Brief Hospital Course:  Gregory Kerr is a 81 year-old male who presented from ALF with 2 days of decreased appetite, difficulty speaking, disorientation, lethargy and difficulty moving found to be septic on admission. hx of prior stroke with resultant L spastic hemiplegia, GERD, HTN, HLD, pseudobulbar affect, osteoporosis, chronic pain, diastolic CHF, COPD, IBS, thoracic ascending aortic aneurysm, CKD 3A.  Urosepsis  Proteus bacteremia Patient with decreased appetite, feeling disoriented, difficulty moving and speaking, and overall feeling "weird" for 2 days prior to admission. Remained afebrile.  CTA head negative for acute ischemic or hemorrhagic changes.  CT soft tissue neck with ascending thoracic aorta aneurysm but no changes of recent pathology to affect his pharynx or larynx. WBC 25.4 initially, and downtrended with antibiotics to 7.0 at time of discharge.  UA significant for nitrites, moderate leukocytes, greater than 50 white blood cells, and many bacteria. Lactic acid 3.2  on admission, and repeat 2.6 s/p 2.5 L fluid resuscitation. Had suprapubic catheter in the past, but was removed due to persistent dislodging and patient decided not to pursue any further cathether. Blood cultures grew Proteus mirabilis, sensitivities below. He was treated with IV Vancomycin and IV Cefepime x 1 day, then transitioned to Amoxicillin '500mg'$  TID for a total of 7 days antibiotics. At time of discharge, he was hemodynamically stable without urinary symptoms.     Hypertension BP well-controlled throughout hospitalization. Lisniopril was held at last admission and not resumed.  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 Recommend annual imaging follow up.  Ensure completion of full amoxicillin course, through 5/31. This completes a 7 day antibiotic course for Proteus bacteremia.     Significant Labs and Imaging:  Recent Labs  Lab 10/16/21 0343 10/17/21 0154 10/18/21 0056  WBC 9.0 8.3 7.0  HGB 12.9* 13.5 13.9  HCT 39.8 40.3 42.8  PLT 115* 131* 156   Recent Labs  Lab 10/12/21 1213 10/13/21 0407 10/14/21 0347 10/16/21 0343  NA 140 138 140 140  K 3.4* 3.4* 3.6 3.5  CL 111 112* 112* 110  CO2 19* 22 21* 24  GLUCOSE 142* 91 87 93  BUN 38* 27* 14 10  CREATININE 1.22 0.76 0.63 0.61  CALCIUM 8.8* 8.1* 8.2* 8.6*  MG 1.5* 1.8  --   --   ALKPHOS 117 90 94  --   AST 57* 45* 37  --   ALT 58* 42 39  --   ALBUMIN 2.7* 2.3* 2.3*  --     Discharge Medications:  Allergies  as of 10/18/2021       Reactions   Penicillins Rash   ++++Patient in nursing home. Unable to clarify details/severity of the reaction.++++        Medication List     TAKE these medications    amoxicillin 500 MG capsule Commonly known as: AMOXIL Take 1 capsule (500 mg total) by mouth every 12 (twelve) hours for 2  doses.   atorvastatin 40 MG tablet Commonly known as: LIPITOR Take 1 tablet (40 mg total) by mouth daily. What changed: when to take this   baclofen 20 MG tablet Commonly known as: LIORESAL Take 20 mg by mouth 3 (three) times daily.   bisacodyl 10 MG suppository Commonly known as: DULCOLAX Place 10 mg rectally daily as needed for moderate constipation.   BOOST PO Take 1 Container by mouth in the morning and at bedtime.   calcium-vitamin D 500-200 MG-UNIT tablet Commonly known as: OSCAL WITH D Take 1 tablet by mouth 2 (two) times daily.   clopidogrel 75 MG tablet Commonly known as: PLAVIX Take 1 tablet (75 mg total) by mouth daily. What changed: when to take this   Dextromethorphan-quiNIDine 20-10 MG capsule Commonly known as: NUEDEXTA Take 1 capsule by mouth every evening. For Pseudobulbar Affect (PBA)   diclofenac Sodium 1 % Gel Commonly known as: VOLTAREN Apply 2 g topically in the morning, at noon, and at bedtime. To right shoulder   gabapentin 100 MG capsule Commonly known as: NEURONTIN Take 100 mg by mouth 3 (three) times daily.   guaiFENesin 600 MG 12 hr tablet Commonly known as: MUCINEX Take 600 mg by mouth 2 (two) times daily.   hydroxypropyl methylcellulose / hypromellose 2.5 % ophthalmic solution Commonly known as: ISOPTO TEARS / GONIOVISC Place 1 drop into both eyes 3 (three) times daily.   loperamide 2 MG tablet Commonly known as: IMODIUM A-D Take 4 mg by mouth 4 (four) times daily as needed for diarrhea or loose stools.   loratadine 10 MG tablet Commonly known as: CLARITIN Take 10 mg by mouth every evening.   LORazepam 0.5 MG tablet Commonly known as: ATIVAN Take 0.5 mg by mouth 2 (two) times daily as needed for anxiety.   Miconazole-Zinc Oxide-Petrolat 0.25-15-81.35 % Oint Apply 1 application topically every 4 (four) hours. To penis and groin area   mupirocin ointment 2 % Commonly known as: BACTROBAN Apply 1 application topically 2 (two)  times daily. Suprapubic site   nitroGLYCERIN 0.4 MG SL tablet Commonly known as: NITROSTAT Place 0.4 mg under the tongue every 5 (five) minutes as needed for chest pain.   nystatin powder Commonly known as: MYCOSTATIN/NYSTOP Apply 1 application. topically See admin instructions. Apply to bilateral groin and penis tid x 7 days   pantoprazole 20 MG tablet Commonly known as: PROTONIX Take 20 mg by mouth daily.   phenazopyridine 100 MG tablet Commonly known as: PYRIDIUM Take 100 mg by mouth every 8 (eight) hours as needed (bladder spasms).   polyethylene glycol 17 g packet Commonly known as: MIRALAX / GLYCOLAX Take 17 g by mouth daily as needed (constipation).   PrePLUS 27-1 MG Tabs Take 1 tablet by mouth daily.   Prolia 60 MG/ML Sosy injection Generic drug: denosumab Inject 60 mg into the skin every 6 (six) months.   sertraline 100 MG tablet Commonly known as: ZOLOFT Take 100 mg by mouth daily.   solifenacin 5 MG tablet Commonly known as: VESICARE Take 1 tablet (5 mg total) by mouth daily.   tamsulosin  0.4 MG Caps capsule Commonly known as: FLOMAX Take 1 capsule (0.4 mg total) by mouth at bedtime.   traMADol 50 MG tablet Commonly known as: ULTRAM Take 50 mg by mouth See admin instructions. Take 50 mg twice daily scheduled, may take a 3rd 50 mg dose as needed for pain   vitamin A & D ointment Apply 1 application topically in the morning, at noon, in the evening, and at bedtime. To abdominal folds, penis and scrotum along with nystatin               Durable Medical Equipment  (From admission, onward)           Start     Ordered   10/18/21 0817  For home use only DME Other see comment  Once       Comments: Condom catheters  Question:  Length of Need  Answer:  Lifetime   10/18/21 0816   10/18/21 0000  For home use only DME Other see comment       Comments: Condom catheter  Question:  Length of Need  Answer:  Lifetime   10/18/21 1120             Discharge Instructions: Please refer to Patient Instructions section of EMR for full details.  Patient was counseled important signs and symptoms that should prompt return to medical care, changes in medications, dietary instructions, activity restrictions, and follow up appointments.   Follow-Up Appointments:  Follow-up Information     Virgel Bouquet, MD. Schedule an appointment as soon as possible for a visit in 1 week(s).   Specialty: Internal Medicine Why: Make a hospital follow up appointment with your primary care doctor. Should be within a week or so of hospital discharge. Contact information: [address] [place] [address] [phone]                 Orvis Brill, DO 10/18/2021, 1:24 PM PGY-1, [place]

## 2021-10-18 NOTE — Progress Notes (Signed)
Occupational Therapy Treatment Patient Details Name: Gregory Kerr MRN: [id number] DOB: September 06, 1940 Today's Date: 10/18/2021   History of present illness 81 y/o male presented to ED on 10/12/21 from SNF for abnormal speech. Was seen on 5/20 for dislodged suprapubic catheter and sent back to SNF without catheter placement. Admitted for urosepsis 2/2 acute cystitis. PMH: HTN, CVA with L spastic hemiplegia, CHF, COPD.   OT comments  Pt still needing total to total + 2 for selfcare tasks and toileting from bed level.  Therapist attempted PROM stretching to the left elbow and hand as well as providing soft palm guard splint to wear to protect the skin of the hand and maintain some digit positioning into slight extension.  Pt with rigid left elbow and wrist tone.  Does not tolerate PROM at either joint currently more than 20-30 degrees.  Recommend continued OT at SNF.    Recommendations for follow up therapy are one component of a multi-disciplinary discharge planning process, led by the attending physician.  Recommendations may be updated based on patient status, additional functional criteria and insurance authorization.    Follow Up Recommendations  Skilled nursing-short term rehab (<3 hours/day)    Assistance Recommended at Discharge Frequent or constant Supervision/Assistance  Patient can return home with the following  Two people to help with walking and/or transfers;Two people to help with bathing/dressing/bathroom;Assistance with feeding;Assist for transportation;Assistance with cooking/housework   Equipment Recommendations  None recommended by OT       Precautions / Restrictions Precautions Precautions: Fall Precaution Comments: L UE and LE contractures Restrictions Weight Bearing Restrictions: No              ADL either performed or assessed with clinical judgement   ADL Overall ADL's : Needs assistance/impaired                                       General  ADL Comments: Pt seen for OT treatment with palm guard issued to help with positioning of the left digits secondary to increased flexor tone.  Pt actually in more digit extension this session compared to previous.  educated pt on donning and doffing so that he can help direct staff at the SNF to assist him with it.  Therapist worked on PROM elbow extension and wrist extension as well during session.  Pt with severly contracted wrist and elbow, only tolerating less than approximately 30 degrees PROM at each joint before it became too painful.               Cognition Arousal/Alertness: Awake/alert Behavior During Therapy: WFL for tasks assessed/performed Overall Cognitive Status: No family/caregiver present to determine baseline cognitive functioning                                                     Pertinent Vitals/ Pain       Pain Assessment Pain Assessment: Faces Faces Pain Scale: Hurts little more Pain Location: left arm and rest with PROM stretching Pain Intervention(s): Repositioned, Limited activity within patient's tolerance         Frequency  Min 2X/week        Progress Toward Goals  OT Goals(current goals can now be found in the care plan section)  Progress towards  OT goals: Progressing toward goals  Acute Rehab OT Goals Patient Stated Goal: Pt thankful to get the hand splint. OT Goal Formulation: With patient Time For Goal Achievement: 11/24/21 Potential to Achieve Goals: [place] Discharge plan remains appropriate       AM-PAC OT "6 Clicks" Daily Activity     Outcome Measure   Help from another person eating meals?: A Lot Help from another person taking care of personal grooming?: A Lot Help from another person toileting, which includes using toliet, bedpan, or urinal?: Total Help from another person bathing (including washing, rinsing, drying)?: Total Help from another person to put on and taking off regular upper body clothing?:  Total Help from another person to put on and taking off regular lower body clothing?: Total 6 Click Score: 8    End of Session    OT Visit Diagnosis: Muscle weakness (generalized) (M62.81);Feeding difficulties (R63.3);Hemiplegia and hemiparesis Hemiplegia - Right/Left: Left Hemiplegia - dominant/non-dominant: Non-Dominant Hemiplegia - caused by: Cerebral infarction   Activity Tolerance Patient tolerated treatment well   Patient Left in bed;with call bell/phone within reach   Nurse Communication Other (comment) (pt issued palm guard)        Time: 1583-0940 OT Time Calculation (min): 15 min  Charges: OT General Charges $OT Visit: 1 Visit OT Treatments $Therapeutic Activity: 8-22 mins  Sonnet Rizor OTR/L 10/18/2021, 3:50 PM

## 2021-10-18 NOTE — Discharge Instructions (Addendum)
Dear Gregory Kerr,  Thank you for letting us participate in your care. You were hospitalized for sepsis from a urinary infection. As we were completing your treatment for that, you developed a blood stream infection with bacteria. You have now been stable on antibiotics.   POST-HOSPITAL & CARE INSTRUCTIONS Continue your amoxicillin as prescribed until complete.  Go to your follow up appointments (listed below)  DOCTOR'S APPOINTMENT   No future appointments.  Follow-up Information     Virgel Bouquet, MD. Schedule an appointment as soon as possible for a visit in 1 week(s).   Specialty: Internal Medicine Why: Make a hospital follow up appointment with your primary care doctor. Should be within a week or so of hospital discharge. Contact information: [place] [address] [phone]               Take care and be well!  [facility]  [place],  [id number] [phone]

## 2021-10-18 NOTE — Progress Notes (Signed)
DISCHARGE NOTE SNF [facility] to be discharged [place]  per MD order. Patient verbalized understanding.  Skin clean, dry and intact without evidence of skin break down, no evidence of skin tears noted. IV catheter discontinued intact. Site without signs and symptoms of complications. Dressing and pressure applied. Pt denies pain at the site currently. No complaints noted.  Patient free of lines, drains, and wounds.   Discharge packet assembled. An After Visit Summary (AVS) was printed and given to the EMS personnel. Patient escorted via stretcher and discharged to [facility] via ambulance. Report called to accepting facility; all questions and concerns addressed.   Viviano Simas, RN

## 2021-10-18 NOTE — Progress Notes (Signed)
FPTS Brief Note Reviewed patient's vitals, recent notes.  Vitals:   10/17/21 1632 10/17/21 2023  BP: (!) 147/69 (!) 177/79  Pulse: 100 73  Resp: 18 16  Temp: 98 F (36.7 C) 98.2 F (36.8 C)  SpO2: 92% 95%   At this time, no change in plan from day progress note.  Shary Key, DO Page [phone] with questions about this patient.

## 2022-02-11 ENCOUNTER — Inpatient Hospital Stay (HOSPITAL_COMMUNITY)
Admission: EM | Admit: 2022-02-11 | Discharge: 2022-02-15 | DRG: 690 | Disposition: A | Discharge status: Skilled Nursing Facility | Payer: Medicare Other | Attending: Internal Medicine | Admitting: Internal Medicine

## 2022-02-11 ENCOUNTER — Other Ambulatory Visit: Payer: Self-pay

## 2022-02-11 ENCOUNTER — Emergency Department (HOSPITAL_COMMUNITY): Payer: Medicare Other

## 2022-02-11 ENCOUNTER — Encounter (HOSPITAL_COMMUNITY): Payer: Self-pay

## 2022-02-11 DIAGNOSIS — Z8249 Family history of ischemic heart disease and other diseases of the circulatory system: Secondary | ICD-10-CM | POA: Diagnosis not present

## 2022-02-11 DIAGNOSIS — R6510 Systemic inflammatory response syndrome (SIRS) of non-infectious origin without acute organ dysfunction: Secondary | ICD-10-CM | POA: Diagnosis present

## 2022-02-11 DIAGNOSIS — Z800 Family history of malignant neoplasm of digestive organs: Secondary | ICD-10-CM | POA: Diagnosis not present

## 2022-02-11 DIAGNOSIS — Z515 Encounter for palliative care: Secondary | ICD-10-CM | POA: Diagnosis not present

## 2022-02-11 DIAGNOSIS — B952 Enterococcus as the cause of diseases classified elsewhere: Secondary | ICD-10-CM | POA: Diagnosis present

## 2022-02-11 DIAGNOSIS — R9431 Abnormal electrocardiogram [ECG] [EKG]: Secondary | ICD-10-CM | POA: Diagnosis present

## 2022-02-11 DIAGNOSIS — G8114 Spastic hemiplegia affecting left nondominant side: Secondary | ICD-10-CM | POA: Diagnosis present

## 2022-02-11 DIAGNOSIS — R339 Retention of urine, unspecified: Secondary | ICD-10-CM | POA: Diagnosis not present

## 2022-02-11 DIAGNOSIS — Z66 Do not resuscitate: Secondary | ICD-10-CM | POA: Diagnosis not present

## 2022-02-11 DIAGNOSIS — F482 Pseudobulbar affect: Secondary | ICD-10-CM | POA: Diagnosis present

## 2022-02-11 DIAGNOSIS — Z7902 Long term (current) use of antithrombotics/antiplatelets: Secondary | ICD-10-CM

## 2022-02-11 DIAGNOSIS — N390 Urinary tract infection, site not specified: Principal | ICD-10-CM | POA: Diagnosis present

## 2022-02-11 DIAGNOSIS — Z79899 Other long term (current) drug therapy: Secondary | ICD-10-CM | POA: Diagnosis not present

## 2022-02-11 DIAGNOSIS — K219 Gastro-esophageal reflux disease without esophagitis: Secondary | ICD-10-CM | POA: Diagnosis present

## 2022-02-11 DIAGNOSIS — I69354 Hemiplegia and hemiparesis following cerebral infarction affecting left non-dominant side: Secondary | ICD-10-CM | POA: Diagnosis not present

## 2022-02-11 DIAGNOSIS — E785 Hyperlipidemia, unspecified: Secondary | ICD-10-CM | POA: Diagnosis present

## 2022-02-11 DIAGNOSIS — Z20822 Contact with and (suspected) exposure to covid-19: Secondary | ICD-10-CM | POA: Diagnosis present

## 2022-02-11 DIAGNOSIS — R531 Weakness: Secondary | ICD-10-CM | POA: Diagnosis not present

## 2022-02-11 DIAGNOSIS — Z880 Allergy status to penicillin: Secondary | ICD-10-CM

## 2022-02-11 DIAGNOSIS — A419 Sepsis, unspecified organism: Secondary | ICD-10-CM

## 2022-02-11 DIAGNOSIS — I6930 Unspecified sequelae of cerebral infarction: Secondary | ICD-10-CM | POA: Diagnosis not present

## 2022-02-11 DIAGNOSIS — I10 Essential (primary) hypertension: Secondary | ICD-10-CM | POA: Diagnosis present

## 2022-02-11 DIAGNOSIS — Z87891 Personal history of nicotine dependence: Secondary | ICD-10-CM

## 2022-02-11 DIAGNOSIS — N3000 Acute cystitis without hematuria: Principal | ICD-10-CM

## 2022-02-11 DIAGNOSIS — M810 Age-related osteoporosis without current pathological fracture: Secondary | ICD-10-CM | POA: Diagnosis present

## 2022-02-11 DIAGNOSIS — Z808 Family history of malignant neoplasm of other organs or systems: Secondary | ICD-10-CM

## 2022-02-11 DIAGNOSIS — Z7401 Bed confinement status: Secondary | ICD-10-CM | POA: Diagnosis not present

## 2022-02-11 DIAGNOSIS — Z7189 Other specified counseling: Secondary | ICD-10-CM | POA: Diagnosis not present

## 2022-02-11 LAB — ETHANOL: Alcohol, Ethyl (B): <10 mg/dL (ref <10)

## 2022-02-11 LAB — CBC
HCT: 46.6 % (ref 39.0–52.0)
Hemoglobin: 15.1 g/dL (ref 13.0–17.0)
MCH: 28.6 pg (ref 26.0–34.0)
MCHC: 32.4 g/dL (ref 30.0–36.0)
MCV: 88.3 fL (ref 80.0–100.0)
Platelets: 178 10*3/uL (ref 150–400)
RBC: 5.28 MIL/uL (ref 4.22–5.81)
RDW: 13.5 % (ref 11.5–15.5)
WBC: 6.1 10*3/uL (ref 4.0–10.5)
nRBC: 0 % (ref 0.0–0.2)

## 2022-02-11 LAB — DIFFERENTIAL
Abs Immature Granulocytes: 0.02 10*3/uL (ref 0.00–0.07)
Basophils Absolute: 0 10*3/uL (ref 0.0–0.1)
Basophils Relative: 1 %
Eosinophils Absolute: 0.2 10*3/uL (ref 0.0–0.5)
Eosinophils Relative: 3 %
Immature Granulocytes: 0 %
Lymphocytes Relative: 28 %
Lymphs Abs: 1.7 10*3/uL (ref 0.7–4.0)
Monocytes Absolute: 0.5 10*3/uL (ref 0.1–1.0)
Monocytes Relative: 8 %
Neutro Abs: 3.7 10*3/uL (ref 1.7–7.7)
Neutrophils Relative %: 60 %

## 2022-02-11 LAB — RESP PANEL BY RT-PCR (FLU A&B, COVID) ARPGX2
Influenza A by PCR: NEGATIVE
Influenza B by PCR: NEGATIVE
SARS Coronavirus 2 by RT PCR: NEGATIVE

## 2022-02-11 LAB — PROCALCITONIN: Procalcitonin: 2.15 ng/mL

## 2022-02-11 LAB — URINALYSIS, ROUTINE W REFLEX MICROSCOPIC
Bilirubin Urine: NEGATIVE
Glucose, UA: NEGATIVE mg/dL
Ketones, ur: NEGATIVE mg/dL
Nitrite: POSITIVE — AB
Protein, ur: 30 mg/dL — AB
Specific Gravity, Urine: 1.02 (ref 1.005–1.030)
pH: 8.5 — ABNORMAL HIGH (ref 5.0–8.0)

## 2022-02-11 LAB — RAPID URINE DRUG SCREEN, HOSP PERFORMED
Amphetamines: NOT DETECTED
Barbiturates: NOT DETECTED
Benzodiazepines: NOT DETECTED
Cocaine: NOT DETECTED
Opiates: NOT DETECTED
Tetrahydrocannabinol: NOT DETECTED

## 2022-02-11 LAB — COMPREHENSIVE METABOLIC PANEL
ALT: 30 U/L (ref 0–44)
AST: 32 U/L (ref 15–41)
Albumin: 3.3 g/dL — ABNORMAL LOW (ref 3.5–5.0)
Alkaline Phosphatase: 98 U/L (ref 38–126)
Anion gap: 4 — ABNORMAL LOW (ref 5–15)
BUN: 18 mg/dL (ref 8–23)
CO2: 25 mmol/L (ref 22–32)
Calcium: 9.3 mg/dL (ref 8.9–10.3)
Chloride: 109 mmol/L (ref 98–111)
Creatinine, Ser: 0.83 mg/dL (ref 0.61–1.24)
GFR, Estimated: >60 mL/min (ref >60)
Glucose, Bld: 101 mg/dL — ABNORMAL HIGH (ref 70–99)
Potassium: 4.2 mmol/L (ref 3.5–5.1)
Sodium: 138 mmol/L (ref 135–145)
Total Bilirubin: 0.8 mg/dL (ref 0.3–1.2)
Total Protein: 7 g/dL (ref 6.5–8.1)

## 2022-02-11 LAB — I-STAT CHEM 8, ED
BUN: 28 mg/dL — ABNORMAL HIGH (ref 8–23)
Calcium, Ion: 1.11 mmol/L — ABNORMAL LOW (ref 1.15–1.40)
Chloride: 108 mmol/L (ref 98–111)
Creatinine, Ser: 0.7 mg/dL (ref 0.61–1.24)
Glucose, Bld: 96 mg/dL (ref 70–99)
HCT: 42 % (ref 39.0–52.0)
Hemoglobin: 14.3 g/dL (ref 13.0–17.0)
Potassium: 7.1 mmol/L (ref 3.5–5.1)
Sodium: 139 mmol/L (ref 135–145)
TCO2: 28 mmol/L (ref 22–32)

## 2022-02-11 LAB — LACTIC ACID, PLASMA
Lactic Acid, Venous: 1.4 mmol/L (ref 0.5–1.9)
Lactic Acid, Venous: 1.2 mmol/L (ref 0.5–1.9)

## 2022-02-11 LAB — URINALYSIS, MICROSCOPIC (REFLEX): Squamous Epithelial / HPF: NONE SEEN (ref 0–5)

## 2022-02-11 LAB — PROTIME-INR
INR: 1 (ref 0.8–1.2)
Prothrombin Time: 13.4 seconds (ref 11.4–15.2)

## 2022-02-11 LAB — APTT: aPTT: 26 seconds (ref 24–36)

## 2022-02-11 MED ORDER — PANTOPRAZOLE SODIUM 20 MG PO TBEC
20.0000 mg | DELAYED_RELEASE_TABLET | Freq: Every day | ORAL | Status: DC
  Administered 2022-02-12 – 2022-02-15 (×4): 20 mg via ORAL
  Filled 2022-02-11 (×4): qty 1

## 2022-02-11 MED ORDER — LORATADINE 10 MG PO TABS
10.0000 mg | ORAL_TABLET | Freq: Every evening | ORAL | Status: DC
  Administered 2022-02-11 – 2022-02-14 (×4): 10 mg via ORAL
  Filled 2022-02-11 (×4): qty 1

## 2022-02-11 MED ORDER — SERTRALINE HCL 100 MG PO TABS
100.0000 mg | ORAL_TABLET | Freq: Every day | ORAL | Status: DC
  Administered 2022-02-11 – 2022-02-15 (×5): 100 mg via ORAL
  Filled 2022-02-11 (×5): qty 1

## 2022-02-11 MED ORDER — ONDANSETRON HCL 4 MG/2ML IJ SOLN: 4.0000 mg | Freq: Four times a day (QID) | INTRAMUSCULAR | Status: DC | PRN

## 2022-02-11 MED ORDER — BACLOFEN 10 MG PO TABS
20.0000 mg | ORAL_TABLET | Freq: Three times a day (TID) | ORAL | Status: DC
  Administered 2022-02-11 – 2022-02-15 (×12): 20 mg via ORAL
  Filled 2022-02-11 (×12): qty 2

## 2022-02-11 MED ORDER — TAMSULOSIN HCL 0.4 MG PO CAPS
0.4000 mg | ORAL_CAPSULE | Freq: Every day | ORAL | Status: DC
  Administered 2022-02-11 – 2022-02-14 (×4): 0.4 mg via ORAL
  Filled 2022-02-11 (×4): qty 1

## 2022-02-11 MED ORDER — ATORVASTATIN CALCIUM 40 MG PO TABS
40.0000 mg | ORAL_TABLET | Freq: Every evening | ORAL | Status: DC
  Administered 2022-02-11 – 2022-02-14 (×4): 40 mg via ORAL
  Filled 2022-02-11 (×4): qty 1

## 2022-02-11 MED ORDER — SODIUM CHLORIDE 0.9 % IV BOLUS
500.0000 mL | Freq: Once | INTRAVENOUS | Status: AC
  Administered 2022-02-11: 500 mL via INTRAVENOUS

## 2022-02-11 MED ORDER — TRAMADOL HCL 50 MG PO TABS
50.0000 mg | ORAL_TABLET | Freq: Every day | ORAL | Status: DC | PRN
  Administered 2022-02-12: 50 mg via ORAL
  Filled 2022-02-11: qty 1

## 2022-02-11 MED ORDER — GUAIFENESIN ER 600 MG PO TB12
600.0000 mg | ORAL_TABLET | Freq: Two times a day (BID) | ORAL | Status: DC
  Administered 2022-02-11 – 2022-02-15 (×9): 600 mg via ORAL
  Filled 2022-02-11 (×9): qty 1

## 2022-02-11 MED ORDER — ACETAMINOPHEN 325 MG PO TABS
650.0000 mg | ORAL_TABLET | Freq: Four times a day (QID) | ORAL | Status: DC | PRN
  Administered 2022-02-13 – 2022-02-14 (×2): 650 mg via ORAL
  Filled 2022-02-11 (×2): qty 2

## 2022-02-11 MED ORDER — SODIUM CHLORIDE 0.9 % IV BOLUS
1000.0000 mL | Freq: Once | INTRAVENOUS | Status: AC
  Administered 2022-02-11: 1000 mL via INTRAVENOUS

## 2022-02-11 MED ORDER — DEXTROMETHORPHAN-QUINIDINE 20-10 MG PO CAPS
1.0000 | ORAL_CAPSULE | Freq: Every evening | ORAL | Status: DC
  Administered 2022-02-11 – 2022-02-14 (×4): 1 via ORAL
  Filled 2022-02-11 (×5): qty 1

## 2022-02-11 MED ORDER — GABAPENTIN 100 MG PO CAPS
100.0000 mg | ORAL_CAPSULE | Freq: Three times a day (TID) | ORAL | Status: DC
  Administered 2022-02-11 – 2022-02-15 (×12): 100 mg via ORAL
  Filled 2022-02-11 (×12): qty 1

## 2022-02-11 MED ORDER — POLYVINYL ALCOHOL 1.4 % OP SOLN
1.0000 [drp] | Freq: Three times a day (TID) | OPHTHALMIC | Status: DC
  Administered 2022-02-11 – 2022-02-15 (×12): 1 [drp] via OPHTHALMIC
  Filled 2022-02-11: qty 15

## 2022-02-11 MED ORDER — ACETAMINOPHEN 500 MG PO TABS
1000.0000 mg | ORAL_TABLET | Freq: Once | ORAL | Status: AC
  Administered 2022-02-11: 1000 mg via ORAL
  Filled 2022-02-11: qty 2

## 2022-02-11 MED ORDER — TRAMADOL HCL 50 MG PO TABS: 50.0000 mg | ORAL_TABLET | ORAL | Status: DC

## 2022-02-11 MED ORDER — CLOPIDOGREL BISULFATE 75 MG PO TABS
75.0000 mg | ORAL_TABLET | Freq: Every evening | ORAL | Status: DC
  Administered 2022-02-11 – 2022-02-14 (×4): 75 mg via ORAL
  Filled 2022-02-11 (×5): qty 1

## 2022-02-11 MED ORDER — PHENAZOPYRIDINE HCL 100 MG PO TABS
100.0000 mg | ORAL_TABLET | Freq: Three times a day (TID) | ORAL | Status: DC | PRN
  Administered 2022-02-12 – 2022-02-14 (×3): 100 mg via ORAL
  Filled 2022-02-11 (×4): qty 1

## 2022-02-11 MED ORDER — LACTATED RINGERS IV SOLN: INTRAVENOUS | Status: DC

## 2022-02-11 MED ORDER — SODIUM CHLORIDE 0.9 % IV SOLN
1.0000 g | Freq: Once | INTRAVENOUS | Status: AC
  Administered 2022-02-11: 1 g via INTRAVENOUS
  Filled 2022-02-11: qty 10

## 2022-02-11 MED ORDER — ACETAMINOPHEN 500 MG PO TABS: 1000.0000 mg | ORAL_TABLET | Freq: Once | ORAL | Status: DC

## 2022-02-11 MED ORDER — FESOTERODINE FUMARATE ER 4 MG PO TB24
4.0000 mg | ORAL_TABLET | Freq: Every day | ORAL | Status: DC
  Administered 2022-02-12 – 2022-02-15 (×4): 4 mg via ORAL
  Filled 2022-02-11 (×4): qty 1

## 2022-02-11 MED ORDER — SODIUM CHLORIDE 0.9% FLUSH
3.0000 mL | Freq: Two times a day (BID) | INTRAVENOUS | Status: DC
  Administered 2022-02-11 – 2022-02-15 (×8): 3 mL via INTRAVENOUS

## 2022-02-11 MED ORDER — ACETAMINOPHEN 650 MG RE SUPP: 650.0000 mg | Freq: Four times a day (QID) | RECTAL | Status: DC | PRN

## 2022-02-11 MED ORDER — HYPROMELLOSE (GONIOSCOPIC) 2.5 % OP SOLN
1.0000 [drp] | Freq: Three times a day (TID) | OPHTHALMIC | Status: DC
  Filled 2022-02-11: qty 15

## 2022-02-11 MED ORDER — TRAMADOL HCL 50 MG PO TABS
50.0000 mg | ORAL_TABLET | Freq: Two times a day (BID) | ORAL | Status: DC
  Administered 2022-02-11 – 2022-02-15 (×9): 50 mg via ORAL
  Filled 2022-02-11 (×9): qty 1

## 2022-02-11 MED ORDER — ONDANSETRON HCL 4 MG PO TABS: 4.0000 mg | ORAL_TABLET | Freq: Four times a day (QID) | ORAL | Status: DC | PRN

## 2022-02-11 MED ORDER — ENOXAPARIN SODIUM 40 MG/0.4ML IJ SOSY
40.0000 mg | PREFILLED_SYRINGE | INTRAMUSCULAR | Status: DC
  Administered 2022-02-11 – 2022-02-14 (×4): 40 mg via SUBCUTANEOUS
  Filled 2022-02-11 (×4): qty 0.4

## 2022-02-11 MED ORDER — SODIUM CHLORIDE 0.9 % IV SOLN
1.0000 g | INTRAVENOUS | Status: DC
  Administered 2022-02-12: 1 g via INTRAVENOUS
  Filled 2022-02-11: qty 10

## 2022-02-11 NOTE — ED Notes (Signed)
Patient straight cath met resistance and patient c/o pain. Cath taken out, no urine return . MD made aware. Will continue to monitor

## 2022-02-11 NOTE — ED Provider Notes (Signed)
[facility] Provider Note   CSN: [id number] Arrival date & time: 02/11/22  0142     History {Add pertinent medical, surgical, social history, OB history to HPI:1} Chief Complaint  Patient presents with   Weakness    EMS reports patient coming from [facility] re[facility]. C/O right sided arm and leg weakness after dinner @ 5pm. Able hold right leg arm but not right leg. No facial droop, no gaze, left side residual from prior stroke/ unknown date. Also c/o SOB, SPO2 @ 95%RA, Lungs clear.  A&Ox4 without confusion.162/81, 68, 16. CBG 116   Extremity Weakness    [facility] is a 81 y.o. male.  Patient presents to the emergency department complaining of right-sided weakness.  Patient has had prior stroke with left hemiparesis.  He started to notice weakness of his right sided extremities around 5 PM.       Home Medications Prior to Admission medications   Medication Sig Start Date End Date Taking? Authorizing Provider  atorvastatin (LIPITOR) 40 MG tablet Take 1 tablet (40 mg total) by mouth daily. Patient taking differently: Take 40 mg by mouth every evening. 11/22/20   Lyndal Pulley, MD  baclofen (LIORESAL) 20 MG tablet Take 20 mg by mouth 3 (three) times daily.    [provider]  bisacodyl (DULCOLAX) 10 MG suppository Place 10 mg rectally daily as needed for moderate constipation. Patient not taking: Reported on 10/13/2021    [provider]  calcium-vitamin D (OSCAL WITH D) 500-200 MG-UNIT tablet Take 1 tablet by mouth 2 (two) times daily. Patient not taking: Reported on 10/13/2021    [provider]  clopidogrel (PLAVIX) 75 MG tablet Take 1 tablet (75 mg total) by mouth daily. Patient taking differently: Take 75 mg by mouth every evening. 11/22/20   Lyndal Pulley, MD  Dextromethorphan-quiNIDine (NUEDEXTA) 20-10 MG capsule Take 1 capsule by mouth every evening. For Pseudobulbar Affect (PBA)    [provider]   diclofenac Sodium (VOLTAREN) 1 % GEL Apply 2 g topically in the morning, at noon, and at bedtime. To right shoulder    [provider]  gabapentin (NEURONTIN) 100 MG capsule Take 100 mg by mouth 3 (three) times daily.    [provider]  guaiFENesin (MUCINEX) 600 MG 12 hr tablet Take 600 mg by mouth 2 (two) times daily.    [provider]  hydroxypropyl methylcellulose / hypromellose (ISOPTO TEARS / GONIOVISC) 2.5 % ophthalmic solution Place 1 drop into both eyes 3 (three) times daily.    [provider]  loperamide (IMODIUM A-D) 2 MG tablet Take 4 mg by mouth 4 (four) times daily as needed for diarrhea or loose stools.    [provider]  loratadine (CLARITIN) 10 MG tablet Take 10 mg by mouth every evening.    [provider]  LORazepam (ATIVAN) 0.5 MG tablet Take 0.5 mg by mouth 2 (two) times daily as needed for anxiety. Patient not taking: Reported on 10/13/2021    [provider]  Miconazole-Zinc Oxide-Petrolat 0.25-15-81.35 % OINT Apply 1 application topically every 4 (four) hours. To penis and groin area Patient not taking: Reported on 10/13/2021    [provider]  mupirocin ointment (BACTROBAN) 2 % Apply 1 application topically 2 (two) times daily. Suprapubic site Patient not taking: Reported on 10/13/2021    [provider]  nitroGLYCERIN (NITROSTAT) 0.4 MG SL tablet Place 0.4 mg under the tongue every 5 (five) minutes as  needed for chest pain.    [provider]  Nutritional Supplements (BOOST PO) Take 1 Container by mouth in the morning and at bedtime.    [provider]  nystatin (MYCOSTATIN/NYSTOP) powder Apply 1 application. topically See admin instructions. Apply to bilateral groin and penis tid x 7 days    [provider]  pantoprazole (PROTONIX) 20 MG tablet Take 20 mg by mouth daily. 11/17/20   [provider]  phenazopyridine (PYRIDIUM) 100 MG tablet Take 100 mg by  mouth every 8 (eight) hours as needed (bladder spasms).    [provider]  polyethylene glycol (MIRALAX / GLYCOLAX) packet Take 17 g by mouth daily as needed (constipation).     [provider]  Prenatal Vit-Fe Fumarate-FA (PREPLUS) 27-1 MG TABS Take 1 tablet by mouth daily.  Patient not taking: Reported on 10/13/2021    [provider]  PROLIA 60 MG/ML SOSY injection Inject 60 mg into the skin every 6 (six) months. 09/07/20   [provider]  sertraline (ZOLOFT) 100 MG tablet Take 100 mg by mouth daily.    [provider]  solifenacin (VESICARE) 5 MG tablet Take 1 tablet (5 mg total) by mouth daily. 11/21/20   Lyndal Pulley, MD  tamsulosin (FLOMAX) 0.4 MG CAPS capsule Take 1 capsule (0.4 mg total) by mouth at bedtime. 11/21/20   Lyndal Pulley, MD  traMADol (ULTRAM) 50 MG tablet Take 50 mg by mouth See admin instructions. Take 50 mg twice daily scheduled, may take a 3rd 50 mg dose as needed for pain    [provider]  Vitamins A & D (VITAMIN A & D) ointment Apply 1 application topically in the morning, at noon, in the evening, and at bedtime. To abdominal folds, penis and scrotum along with nystatin    [provider]      Allergies    Penicillins    Review of Systems   Review of Systems  Physical Exam Updated Vital Signs BP (!) 141/78   Pulse 72   Temp 98.3 F (36.8 C) (Oral)   SpO2 95%  Physical Exam Vitals and nursing note reviewed.  Constitutional:      General: He is not in acute distress.    Appearance: He is well-developed.  HENT:     Head: Normocephalic and atraumatic.     Mouth/Throat:     Mouth: Mucous membranes are moist.  Eyes:     General: Vision grossly intact. Gaze aligned appropriately.     Extraocular Movements: Extraocular movements intact.     Conjunctiva/sclera: Conjunctivae normal.  Cardiovascular:     Rate and Rhythm: Normal rate and regular rhythm.     Pulses: Normal pulses.     Heart sounds:  Normal heart sounds, S1 normal and S2 normal. No murmur heard.    No friction rub. No gallop.  Pulmonary:     Effort: Pulmonary effort is normal. No respiratory distress.     Breath sounds: Normal breath sounds.  Abdominal:     Palpations: Abdomen is soft.     Tenderness: There is no abdominal tenderness. There is no guarding or rebound.     Hernia: No hernia is present.  Musculoskeletal:        General: No swelling.     Cervical back: Full passive range of motion without pain, normal range of motion and neck supple. No pain with movement, spinous process tenderness or muscular tenderness. Normal range of motion.     Right  lower leg: No edema.     Left lower leg: No edema.  Skin:    General: Skin is warm and dry.     Capillary Refill: Capillary refill takes less than 2 seconds.     Findings: No ecchymosis, erythema, lesion or wound.  Neurological:     Mental Status: He is alert and oriented to person, place, and time.     GCS: GCS eye subscore is 4. GCS verbal subscore is 5. GCS motor subscore is 6.     Cranial Nerves: Cranial nerves 2-12 are intact.     Motor: Weakness (RLE>RUE) and abnormal muscle tone (contracted left side) present.     Coordination: Coordination is intact.  Psychiatric:        Mood and Affect: Mood normal.        Speech: Speech normal.        Behavior: Behavior normal.     ED Results / Procedures / Treatments   Labs (all labs ordered are listed, but only abnormal results are displayed) Labs Reviewed  RESP PANEL BY RT-PCR (FLU A&B, COVID) ARPGX2  ETHANOL  PROTIME-INR  APTT  CBC  DIFFERENTIAL  COMPREHENSIVE METABOLIC PANEL  RAPID URINE DRUG SCREEN, HOSP PERFORMED  URINALYSIS, ROUTINE W REFLEX MICROSCOPIC  I-STAT CHEM 8, ED    EKG None  Radiology No results found.  Procedures Procedures  {Document cardiac monitor, telemetry assessment procedure when appropriate:1}  Medications Ordered in ED Medications - No data to display  ED Course/  Medical Decision Making/ A&P                           Medical Decision Making Amount and/or Complexity of Data Reviewed Labs: ordered. Radiology: ordered.   ***  {Document critical care time when appropriate:1} {Document review of labs and clinical decision tools ie heart score, Chads2Vasc2 etc:1}  {Document your independent review of radiology images, and any outside records:1} {Document your discussion with family members, caretakers, and with consultants:1} {Document social determinants of health affecting pt's care:1} {Document your decision making why or why not admission, treatments were needed:1} Final Clinical Impression(s) / ED Diagnoses Final diagnoses:  None    Rx / DC Orders ED Discharge Orders     None

## 2022-02-11 NOTE — ED Notes (Signed)
Hospitalist at bedside 

## 2022-02-11 NOTE — ED Provider Notes (Signed)
Physical Exam  BP 132/79   Pulse (!) 111   Temp (!) 101.2 F (38.4 C) (Rectal)   Resp 10   SpO2 94%   Physical Exam Vitals and nursing note reviewed.  Constitutional:      General: He is not in acute distress.    Appearance: Normal appearance.  HENT:     Mouth/Throat:     Mouth: Mucous membranes are dry.  Eyes:     Conjunctiva/sclera: Conjunctivae normal.  Cardiovascular:     Rate and Rhythm: Regular rhythm. Tachycardia present.  Pulmonary:     Effort: Pulmonary effort is normal. No respiratory distress.     Breath sounds: Normal breath sounds.  Abdominal:     General: Abdomen is flat.     Palpations: Abdomen is soft.     Tenderness: There is no abdominal tenderness.  Musculoskeletal:     Right lower leg: No edema.     Left lower leg: No edema.  Skin:    General: Skin is warm and dry.     Capillary Refill: Capillary refill takes less than 2 seconds.  Neurological:     Mental Status: He is alert and oriented to person, place, and time. Mental status is at baseline.     Comments: Chronic left sided contractures  Psychiatric:        Mood and Affect: Mood normal.        Behavior: Behavior normal.     Procedures  .Critical Care  Performed by: Cristie Hem, MD Authorized by: Cristie Hem, MD   Critical care provider statement:    Critical care time (minutes):  30   Critical care end time:  02/11/2022 11:46 AM   Critical care was necessary to treat or prevent imminent or life-threatening deterioration of the following conditions:  Sepsis, shock and renal failure   Critical care was time spent personally by me on the following activities:  Development of treatment plan with patient or surrogate, discussions with consultants, evaluation of patient's response to treatment, examination of patient, ordering and review of laboratory studies, ordering and review of radiographic studies, ordering and performing treatments and interventions, pulse oximetry,  re-evaluation of patient's condition and review of old charts   Care discussed with: admitting provider     ED Course / MDM   Clinical Course as of 02/11/22 1143  Sat Feb 11, 2022  0735 Signed out from Dr. Betsey Holiday pending urinalysis. Presented for weakness, MRI negative for stroke. Pending urinalysis.  [WS]  1610 Urinalysis concerning for UTI.  Has prior history of urosepsis, but WBC count is reassuring.  We will recheck temp. we will give fluid as patient appears dry, with dry mucous membranes.  Patient also uncomfortable which may contribute to his tachycardia.  Will order CT renal stone to evaluate for kidney stone as patient does report some flank pain. [WS]  9604 Patient with borderline fever now, tachycardia, possible recurrent urosepsis.  Pending CT scan, likely will admit. [WS]  1109 CT scan without acute ureterolithiasis or obstructive uropathy, will admit [WS]  1143 Discussed with Dr. Inda Merlin, hospitalist, who will admit the patient for sepsis. [WS]    Clinical Course User Index [WS] Cristie Hem, MD   Medical Decision Making Problems Addressed: Acute cystitis without hematuria: acute illness or injury with systemic symptoms that poses a threat to life or bodily functions Sepsis, due to unspecified organism, unspecified whether acute organ dysfunction present [facility]): acute illness or injury with systemic symptoms that poses a threat  to life or bodily functions  Amount and/or Complexity of Data Reviewed Labs: ordered. Decision-making details documented in ED Course. Radiology: ordered and independent interpretation performed.    Details: No sign of renal stone ECG/medicine tests: ordered and independent interpretation performed.    Details: tachycardia  Risk OTC drugs. Decision regarding hospitalization.          Cristie Hem, MD 02/11/22 1146

## 2022-02-11 NOTE — H&P (Signed)
History and Physical    Patient: Gregory Kerr [id number] DOB: 08-16-1940 DOA: 02/11/2022 DOS: the patient was seen and examined on 02/11/2022 PCP: Virgel Bouquet, MD  Patient coming from: [facility]; NOK: Sister, [phone]   Chief Complaint: Generalized weakness  HPI: Gregory Kerr is a 81 y.o. male with medical history significant of HTN, HLD, and prior CVA with residual L hemiplegia presenting with generalized weakness.  He provides a vague history, reports not feeling well.  Denies urinary symptoms, just felt weak all over.  No fever.  The facility told her that he was having problems with eating his dinner and was exhibiting further stroke signs.  He has had "so many" UTIs.      ER Course:  h/o CVA, came in with generalized weakness.  MRI negative.  Tachycardia, febrile, appears to have urosepsis.  Urine culture ordered.  H/o Proteus, given Rocephin.  Given 1L IVF.       Review of Systems: As mentioned in the history of present illness. All other systems reviewed and are negative. 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   Pneumonia    "once or twice" (09/22/2014)   Retinal detachment    left eye   Stroke [facility]) 2007   "left side doesn't work now" (09/22/2014)   Past Surgical History:  Procedure Laterality Date   APPENDECTOMY  05/27/2009   Ronnald Collum, MD   CATARACT EXTRACTION W/ INTRAOCULAR LENS  IMPLANT, BILATERAL Bilateral ?2014   CHOLECYSTECTOMY N/A 09/26/2014   Procedure: LAPAROSCOPIC CHOLECYSTECTOMY WITH INTRAOPERATIVE CHOLANGIOGRAM;  Surgeon: Donnie Mesa, MD;  Location: [place];  Service: General;  Laterality: N/A;   COLONOSCOPY WITH PROPOFOL N/A 12/29/2015   Procedure:  COLONOSCOPY WITH PROPOFOL;  Surgeon: Wilford Corner, MD;  Location: [facility] ENDOSCOPY;  Service: Endoscopy;  Laterality: N/A;   ERCP N/A 09/25/2014   Procedure: ENDOSCOPIC RETROGRADE CHOLANGIOPANCREATOGRAPHY (ERCP);  Surgeon: Clarene Essex, MD;  Location: [facility] ENDOSCOPY;  Service: Endoscopy;  Laterality: N/A;   IR CATHETER TUBE CHANGE  05/24/2021   PARS PLANA VITRECTOMY Left 01/30/2019   Procedure: PARS PLANA VITRECTOMY WITH 25 GAUGE;  Surgeon: Sherlynn Stalls, MD;  Location: [place];  Service: Ophthalmology;  Laterality: Left;   PARS PLANA VITRECTOMY Right 11/13/2019   Procedure: PARS PLANA VITRECTOMY WITH 25 GAUGE WITH REMOVAL OF POSTERIOR CAPSULAR OPACIFICATION WITH VITRECTOR;  Surgeon: Sherlynn Stalls, MD;  Location: [place];  Service: Ophthalmology;  Laterality: Right;   PHOTOCOAGULATION WITH LASER Left 01/30/2019   Procedure: PHOTOCOAGULATION WITH LASER;  Surgeon: Sherlynn Stalls, MD;  Location: [place];  Service: Ophthalmology;  Laterality: Left;   Social History:  reports that he quit smoking about 16 years ago. His smoking use included cigarettes. He smoked an average of 1 pack per day. He has never used smokeless tobacco. He reports that he does not currently use alcohol. He reports that he does not use drugs.  Allergies  Allergen Reactions   Penicillins Rash    ++++Patient in nursing home. Unable to clarify details/severity of the reaction.++++    Family History  Problem Relation Age of Onset   Heart attack Mother    Heart attack Father    Cancer Maternal Aunt  Stomach   Cancer Paternal [name]    Prior to Admission medications   Medication Sig Start Date End Date Taking? Authorizing Provider  atorvastatin (LIPITOR) 40 MG tablet Take 1 tablet (40 mg total) by mouth daily. Patient taking differently: Take 40 mg by mouth every evening. 11/22/20   Lyndal Pulley, MD  baclofen (LIORESAL) 20 MG tablet Take 20 mg by mouth 3 (three) times daily.    [provider]   bisacodyl (DULCOLAX) 10 MG suppository Place 10 mg rectally daily as needed for moderate constipation. Patient not taking: Reported on 10/13/2021    [provider]  calcium-vitamin D (OSCAL WITH D) 500-200 MG-UNIT tablet Take 1 tablet by mouth 2 (two) times daily. Patient not taking: Reported on 10/13/2021    [provider]  clopidogrel (PLAVIX) 75 MG tablet Take 1 tablet (75 mg total) by mouth daily. Patient taking differently: Take 75 mg by mouth every evening. 11/22/20   Lyndal Pulley, MD  Dextromethorphan-quiNIDine (NUEDEXTA) 20-10 MG capsule Take 1 capsule by mouth every evening. For Pseudobulbar Affect (PBA)    [provider]  diclofenac Sodium (VOLTAREN) 1 % GEL Apply 2 g topically in the morning, at noon, and at bedtime. To right shoulder    [provider]  gabapentin (NEURONTIN) 100 MG capsule Take 100 mg by mouth 3 (three) times daily.    [provider]  guaiFENesin (MUCINEX) 600 MG 12 hr tablet Take 600 mg by mouth 2 (two) times daily.    [provider]  hydroxypropyl methylcellulose / hypromellose (ISOPTO TEARS / GONIOVISC) 2.5 % ophthalmic solution Place 1 drop into both eyes 3 (three) times daily.    [provider]  loperamide (IMODIUM A-D) 2 MG tablet Take 4 mg by mouth 4 (four) times daily as needed for diarrhea or loose stools.    [provider]  loratadine (CLARITIN) 10 MG tablet Take 10 mg by mouth every evening.    [provider]  LORazepam (ATIVAN) 0.5 MG tablet Take 0.5 mg by mouth 2 (two) times daily as needed for anxiety. Patient not taking: Reported on 10/13/2021    [provider]  Miconazole-Zinc Oxide-Petrolat 0.25-15-81.35 % OINT Apply 1 application topically every 4 (four) hours. To penis and groin area Patient not taking: Reported on 10/13/2021    [provider]  mupirocin ointment (BACTROBAN) 2 % Apply 1 application topically 2 (two) times daily. Suprapubic  site Patient not taking: Reported on 10/13/2021    [provider]  nitroGLYCERIN (NITROSTAT) 0.4 MG SL tablet Place 0.4 mg under the tongue every 5 (five) minutes as needed for chest pain.    [provider]  Nutritional Supplements (BOOST PO) Take 1 Container by mouth in the morning and at bedtime.    [provider]  nystatin (MYCOSTATIN/NYSTOP) powder Apply 1 application. topically See admin instructions. Apply to bilateral groin and penis tid x 7 days    [provider]  pantoprazole (PROTONIX) 20 MG tablet Take 20 mg by mouth daily. 11/17/20   [provider]  phenazopyridine (PYRIDIUM) 100 MG tablet Take 100 mg by mouth every 8 (eight) hours as needed (bladder spasms).    [provider]  polyethylene glycol (MIRALAX / GLYCOLAX) packet Take 17 g by mouth daily as needed (constipation).     [provider]  Prenatal Vit-Fe Fumarate-FA (PREPLUS) 27-1 MG TABS Take 1 tablet by mouth daily.  Patient not taking:  Reported on 10/13/2021    [provider]  PROLIA 60 MG/ML SOSY injection Inject 60 mg into the skin every 6 (six) months. 09/07/20   [provider]  sertraline (ZOLOFT) 100 MG tablet Take 100 mg by mouth daily.    [provider]  solifenacin (VESICARE) 5 MG tablet Take 1 tablet (5 mg total) by mouth daily. 11/21/20   Lyndal Pulley, MD  tamsulosin (FLOMAX) 0.4 MG CAPS capsule Take 1 capsule (0.4 mg total) by mouth at bedtime. 11/21/20   Lyndal Pulley, MD  traMADol (ULTRAM) 50 MG tablet Take 50 mg by mouth See admin instructions. Take 50 mg twice daily scheduled, may take a 3rd 50 mg dose as needed for pain    [provider]  Vitamins A & D (VITAMIN A & D) ointment Apply 1 application topically in the morning, at noon, in the evening, and at bedtime. To abdominal folds, penis and scrotum along with nystatin    [provider]    Physical Exam: Vitals:   02/11/22 1100 02/11/22 1130  02/11/22 1200 02/11/22 1230  BP: (!) 154/84 132/79 134/73 123/63  Pulse: (!) 117 (!) 111 (!) 110 (!) 111  Resp: (!) '22 10 16 15  '$ Temp:   98.8 F (37.1 C)   TempSrc:   Rectal   SpO2: 94% 94% 95% 93%   General:  Appears chronically ill Eyes:  EOMI, normal lids, iris ENT:  grossly normal hearing, lips & tongue, mmm Neck:  no LAD, masses or thyromegaly Cardiovascular:  RR with tachycardia, no m/r/g. No LE edema.  Respiratory:   CTA bilaterally with no wheezes/rales/rhonchi.  Normal to mildly increased respiratory effort on [place] O2. Abdomen:  soft, NT, ND Skin:  no rash or induration seen on limited exam Musculoskeletal:  L hemiplegia with LUE and LLE contractures Psychiatric:  blunted mood and affect, speech sparse but appropriate, AOx3 Neurologic:  CN 2-12 grossly intact, L hemiplegia with contractures   Radiological Exams on Admission: Independently reviewed - see discussion in A/P where applicable  CT Renal Stone Study  Result Date: 02/11/2022 CLINICAL DATA:  Flank pain.  Nephrolithiasis. EXAM: CT ABDOMEN AND PELVIS WITHOUT CONTRAST TECHNIQUE: Multidetector CT imaging of the abdomen and pelvis was performed following the standard protocol without IV contrast. RADIATION DOSE REDUCTION: This exam was performed according to the departmental dose-optimization program which includes automated exposure control, adjustment of the mA and/or kV according to patient size and/or use of iterative reconstruction technique. COMPARISON:  None Available. FINDINGS: Lower chest: No acute findings. Hepatobiliary: No mass visualized on this unenhanced exam. Prior cholecystectomy. No evidence of biliary obstruction. Pancreas: No mass or inflammatory process visualized on this unenhanced exam. Spleen:  Within normal limits in size. Adrenals/Urinary tract: Renal vascular calcification noted bilaterally. Probable 3 mm nonobstructing calculus noted in upper pole of right kidney. No evidence of ureteral calculi or  hydronephrosis. Diffuse bladder wall thickening is again demonstrated. Several bladder calculi are again seen, largest measuring 8 mm. Stomach/Bowel: Large hiatal hernia is again seen containing the entire stomach. No evidence of obstruction, inflammatory process, or abnormal fluid collections. A moderate right inguinal hernia is seen containing several small bowel loops, however there is no evidence of bowel obstruction or strangulation. Small left inguinal hernia is again seen which contains only fat. Large amount of stool is seen, particularly in the rectum, similar to prior exam. Vascular/Lymphatic: No pathologically enlarged lymph nodes identified. No evidence of abdominal aortic aneurysm. Aortic atherosclerotic calcification incidentally noted.  Reproductive:  No mass or other significant abnormality. Other:  None. Musculoskeletal:  No suspicious bone lesions identified. IMPRESSION: Probable 3 mm nonobstructing right renal calculus. No evidence of ureteral calculi, hydronephrosis, or other acute findings. 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 Several small bladder calculi again noted. Stable large hiatal hernia. Moderate right inguinal hernia containing several small bowel loops. No evidence of bowel obstruction or strangulation. Stable small left inguinal hernia, which contains only fat. Electronically Signed   By: Marlaine Hind M.D.   On: 02/11/2022 11:05   MR BRAIN WO CONTRAST  Result Date: 02/11/2022 CLINICAL DATA:  81 year old male right side arm and leg weakness. EXAM: MRI HEAD WITHOUT CONTRAST TECHNIQUE: Multiplanar, multiecho pulse sequences of the brain and surrounding structures were obtained without intravenous contrast. COMPARISON:  Head CT 0250 hours today.  Brain MRI 11/18/2020. FINDINGS: Brain: No restricted diffusion or evidence of acute infarction. Dysplastic appearance of the corpus callosum with superimposed or associated chronic small vessel disease  redemonstrated. Chronic lacunar-type infarcts and small vessel ischemia in the bilateral cerebral white matter, bilateral deep gray nuclei, left brainstem. Stable cerebral volume. Stable chronic microhemorrhage in the right parietal lobe. No midline shift, mass effect, evidence of mass lesion, ventriculomegaly, extra-axial collection or acute intracranial hemorrhage. Cervicomedullary junction and pituitary are within normal limits. Vascular: Major intracranial vascular flow voids are stable. Generalized intracranial artery tortuosity. Skull and upper cervical spine: Stable. Visualized bone marrow signal is within normal limits. Sinuses/Orbits: Postoperative changes to both globes. Stable left maxillary sinus retention cyst. Other: Visible internal auditory structures appear normal. Mastoids remain clear. Negative visible scalp and face. IMPRESSION: 1. No acute intracranial abnormality. 2. Advanced chronic small vessel disease, including in the left brainstem. 3. 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 Electronically Signed   By: Genevie Ann M.D.   On: 02/11/2022 07:22   CT HEAD WO CONTRAST  Result Date: 02/11/2022 CLINICAL DATA:  Neuro deficit; acute stroke suspected EXAM: CT HEAD WITHOUT CONTRAST TECHNIQUE: Contiguous axial images were obtained from the base of the skull through the vertex without intravenous contrast. RADIATION DOSE REDUCTION: This exam was performed according to the departmental dose-optimization program which includes automated exposure control, adjustment of the mA and/or kV according to patient size and/or use of iterative reconstruction technique. COMPARISON:  CT head 10/12/2021 FINDINGS: Brain: No intracranial hemorrhage, mass effect, or evidence of acute infarct. No hydrocephalus. No extra-axial fluid collection. Generalized cerebral atrophy. Ill-defined hypoattenuation within the cerebral white matter is nonspecific but consistent with chronic small vessel  ischemic disease. Chronic infarct right thalamus. Callosal dysgenesis. Vascular: No hyperdense vessel. Intracranial arterial calcification. Skull: No fracture or focal lesion. Sinuses/Orbits: No acute finding. Mucous retention cyst left maxillary sinus. Other: None. IMPRESSION: No acute intracranial abnormality. Age-related atrophy and chronic microvascular ischemic change. Electronically Signed   By: Placido Sou M.D.   On: 02/11/2022 03:17    EKG: Independently reviewed.  NSR with rate 70; prolonged QTc 544; LVH; nonspecific ST changes with no evidence of acute ischemia   Labs on Admission: I have personally reviewed the available labs and imaging studies at the time of the admission.  Pertinent labs:    Unremarkable CMP Normal CBC INR 1 Lactate 1.4 ETOH negative UDS negative UA: large Hgb, large LE, + nitrite, 30 protein, many bacteria Blood and urine cultures pending   Assessment and Plan: Principal Problem:   SIRS (systemic inflammatory response syndrome) (HCC) Active Problems:   HTN (hypertension)   Pseudobulbar  affect   UTI (urinary tract infection)   Left spastic hemiplegia (HCC)   Hyperlipidemia LDL goal <100   History of CVA with residual deficit   Generalized weakness   Prolonged QT interval    SIRS Syndrome due to UTI  -SIRS criteria in this patient includes: Fever, tachycardia, tachypnea  -Patient has no current evidence of acute organ failure to suggest sepsis -Sepsis protocol initiated -Suspected source is UTI -Blood and urine cultures pending -Will admit due to: serious infection in the setting of severe baseline debility -His last urine culture grew Proteus that was resistant only to Cipro -Treat with IV Rocephin for presumed urinary source -Will procalcitonin level.  Antibiotics would not be indicated for PCT <0.1 and probably should not be used for < 0.25.  >0.5 indicates infection and >>0.5 indicates more serious disease.  As the procalcitonin level  normalizes, it will be reasonable to consider de-escalation of antibiotic coverage. -Hold trimethoprim (prophylaxis, takes daily) -Prior suprapubic catheter but no catheter at this time -Continue Veiscare and tamsulosin, prn Pyridium  Generalized weakness -L hemiplegia from previous CVA -Initial concern was for CVA but evaluation including MRI was negative -Weakness is thought to be due to UTI -PT/OT consults -TOC consulted  H/o CVA -Significant deficits from prior CVA, he is non-ambulatory  -Continue Plavix -Continue Baclofen, Tramadol, gabapentin, sertraline  HTN -He does not appear to be taking medications for this issue at this time   HLD -Continue atorvastatin  Pseudobulbar Affect -Continue Nuedexta    Advance Care Planning:   Code Status: Full Code   Consults: PT/OT/TOC team  DVT Prophylaxis: Lovenox  Family Communication: None present; I spoke with his sister by telephone at the time of admission  Severity of Illness: The appropriate patient status for this patient is INPATIENT. Inpatient status is judged to be reasonable and necessary in order to provide the required intensity of service to ensure the patient's safety. The patient's presenting symptoms, physical exam findings, and initial radiographic and laboratory data in the context of their chronic comorbidities is felt to place them at high risk for further clinical deterioration. Furthermore, it is not anticipated that the patient will be medically stable for discharge from the hospital within 2 midnights of admission.   * I certify that at the point of admission it is my clinical judgment that the patient will require inpatient hospital care spanning beyond 2 midnights from the point of admission due to high intensity of service, high risk for further deterioration and high frequency of surveillance required.*  Author: Karmen Bongo, MD 02/11/2022 1:35 PM  For on call review [email or web address].

## 2022-02-11 NOTE — ED Notes (Signed)
Pt passed swallow eval. Pt was able to drink water with out any coughing.

## 2022-02-11 NOTE — ED Notes (Signed)
Pt had an oxygen desaturation to 88%, with reports of SOB. Pt placed on 2L [place], with improvement of oxygen to 93% and decreased SOB reported by pt.

## 2022-02-11 NOTE — ED Triage Notes (Signed)
EMS reports patient coming from [facility] and rehab. C/O right sided arm and leg weakness after dinner @ 5pm. Able hold right leg arm but not right leg. No facial droop, no gaze, left side residual from prior stroke/ unknown date. Also c/o SOB, SPO2 @ 95%RA, Lungs clear.  A&Ox4 without confusion.162/81, 68, 16. CBG 116

## 2022-02-12 ENCOUNTER — Inpatient Hospital Stay (HOSPITAL_COMMUNITY): Payer: Medicare Other

## 2022-02-12 DIAGNOSIS — G8114 Spastic hemiplegia affecting left nondominant side: Secondary | ICD-10-CM | POA: Diagnosis not present

## 2022-02-12 DIAGNOSIS — N390 Urinary tract infection, site not specified: Secondary | ICD-10-CM | POA: Diagnosis not present

## 2022-02-12 DIAGNOSIS — R531 Weakness: Secondary | ICD-10-CM

## 2022-02-12 DIAGNOSIS — R338 Other retention of urine: Secondary | ICD-10-CM

## 2022-02-12 DIAGNOSIS — R6510 Systemic inflammatory response syndrome (SIRS) of non-infectious origin without acute organ dysfunction: Secondary | ICD-10-CM | POA: Diagnosis not present

## 2022-02-12 LAB — BASIC METABOLIC PANEL
Anion gap: 7 (ref 5–15)
BUN: 17 mg/dL (ref 8–23)
CO2: 22 mmol/L (ref 22–32)
Calcium: 8.5 mg/dL — ABNORMAL LOW (ref 8.9–10.3)
Chloride: 110 mmol/L (ref 98–111)
Creatinine, Ser: 0.85 mg/dL (ref 0.61–1.24)
GFR, Estimated: >60 mL/min (ref >60)
Glucose, Bld: 83 mg/dL (ref 70–99)
Potassium: 3.7 mmol/L (ref 3.5–5.1)
Sodium: 139 mmol/L (ref 135–145)

## 2022-02-12 LAB — CBC
HCT: 37.2 % — ABNORMAL LOW (ref 39.0–52.0)
Hemoglobin: 12.6 g/dL — ABNORMAL LOW (ref 13.0–17.0)
MCH: 29.2 pg (ref 26.0–34.0)
MCHC: 33.9 g/dL (ref 30.0–36.0)
MCV: 86.3 fL (ref 80.0–100.0)
Platelets: 123 10*3/uL — ABNORMAL LOW (ref 150–400)
RBC: 4.31 MIL/uL (ref 4.22–5.81)
RDW: 13.8 % (ref 11.5–15.5)
WBC: 6.8 10*3/uL (ref 4.0–10.5)
nRBC: 0 % (ref 0.0–0.2)

## 2022-02-12 MED ORDER — HYDRALAZINE HCL 25 MG PO TABS
25.0000 mg | ORAL_TABLET | Freq: Four times a day (QID) | ORAL | Status: DC | PRN
  Administered 2022-02-12: 25 mg via ORAL
  Filled 2022-02-12: qty 1

## 2022-02-12 MED ORDER — MORPHINE SULFATE (PF) 2 MG/ML IV SOLN
1.0000 mg | INTRAVENOUS | Status: DC | PRN
  Administered 2022-02-12: 1 mg via INTRAVENOUS
  Filled 2022-02-12: qty 1

## 2022-02-12 MED ORDER — METOPROLOL TARTRATE 5 MG/5ML IV SOLN
2.5000 mg | Freq: Four times a day (QID) | INTRAVENOUS | Status: DC | PRN
  Administered 2022-02-12: 2.5 mg via INTRAVENOUS
  Filled 2022-02-12 (×2): qty 5

## 2022-02-12 MED ORDER — POLYETHYLENE GLYCOL 3350 17 G PO PACK
17.0000 g | PACK | Freq: Every day | ORAL | Status: DC | PRN
  Administered 2022-02-12: 17 g via ORAL
  Filled 2022-02-12: qty 1

## 2022-02-12 MED ORDER — SODIUM CHLORIDE 0.9 % IV SOLN
1.0000 g | Freq: Four times a day (QID) | INTRAVENOUS | Status: DC
  Administered 2022-02-12 – 2022-02-15 (×12): 1 g via INTRAVENOUS
  Filled 2022-02-12 (×14): qty 1000

## 2022-02-12 NOTE — Progress Notes (Signed)
OT Cancellation Note  Patient Details Name: Gregory Kerr MRN: [id number] DOB: June 08, 1940   Cancelled Treatment:    Reason Eval/Treat Not Completed: Other (comment) Pt is Medicare and current D/C plan is to return to SNF where he is a LTC resident, hoyer at baseline and dependent with ADL tasks. No apparent immediate acute care OT needs, therefore will defer OT to SNF. If OT eval is needed please call Acute Rehab Dept. at [phone].    Gregory Kerr,HILLARY 02/12/2022, 11:33 AM Maurie Boettcher, OT/L   Acute OT Clinical Specialist Acute Rehabilitation Services Pager [phone] Office [phone]

## 2022-02-12 NOTE — Progress Notes (Signed)
Pt prefers not to have in and out cath if avoidable. Scheduled flomax given with other meds and prn hydralazine for hypertension.  Explained to patient will allow time for meds to work and provide opportunity pt to pass urine on own before proceeding with cath. Pt has voided 200 ml since in and out cath.  Will continue to monitor pt closely and notified Mindy, RRT of pt as well. PCXR results pending .

## 2022-02-12 NOTE — Progress Notes (Signed)
Pt bladder scan 505 ml after 200 voided.  During in and out cath, resistance was met and unable to complete.  Pt did void incontinent amount after.  Will notify provider.

## 2022-02-12 NOTE — Progress Notes (Signed)
Patient has increased RR and oxygen requirement, SBP 180-190, and urinary retention. Plan to I&O cath, check CXR, and use hydralazine as needed.

## 2022-02-12 NOTE — Progress Notes (Signed)
PROGRESS NOTE    Gregory Kerr  TMH:[id number] DOB: 04/05/1941 DOA: 02/11/2022 PCP: Virgel Bouquet, MD   Brief Narrative:  81 y.o. male with medical history significant of HTN, HLD, and prior CVA with residual L hemiplegia presented from SNF with generalized weakness.  On presentation, MRI of brain was negative for acute stroke.  He was tachycardic, febrile with possible UTI.  He was started on IV fluids and antibiotics.  Assessment & Plan:   UTI: Present on admission SIRS -Presented with fever, tachycardia, tachypnea with possible UTI. -Currently on Rocephin.  Blood cultures negative so far.  Urine cultures growing Enterococcus faecalis.  Switch Rocephin to Unasyn or vancomycin  Generalized weakness -MRI of brain was negative for acute stroke.  PT/OT eval  History of CVA, unspecified with residual left hemiplegia -Patient is nonambulatory. -Continue Plavix, baclofen, tramadol, gabapentin, sertraline -Palliative care for goals of care discussion  Hypertension -Blood pressure stable.  Monitor.  Currently not on antihypertensives  Hyperlipidemia -Continue statin  Pseudobulbar affect -Continue Nuedexta   DVT prophylaxis: Lovenox  code Status: Full Family Communication: None at bedside Disposition Plan: Status is: Inpatient Remains inpatient appropriate because: Of severity of illness    Consultants: None  Procedures: None  Antimicrobials: Rocephin from 02/11/2022 onwards   Subjective: Patient seen and examined at bedside.  Slow to respond, poor historian.  No fever, agitation, seizures reported.  Objective: Vitals:   02/11/22 2050 02/12/22 0047 02/12/22 0418 02/12/22 0625  BP: 139/78 (!) 148/51 120/73 (!) 153/71  Pulse: 87 92 96 73  Resp: '16 16 16 18  '$ Temp: 98.2 F (36.8 C)  98.8 F (37.1 C) 98.1 F (36.7 C)  TempSrc: Oral  Oral Oral  SpO2: 97% 95% 90% 95%    Intake/Output Summary (Last 24 hours) at 02/12/2022 1128 Last data filed at 02/12/2022  0700 Gross per 24 hour  Intake 1779.72 ml  Output --  Net 1779.72 ml   There were no vitals filed for this visit.  Examination:  General exam: Appears calm and comfortable.  Looks chronically ill and deconditioned.  Currently on room air. Respiratory system: Bilateral decreased breath sounds at bases Cardiovascular system: S1 & S2 heard, Rate controlled Gastrointestinal system: Abdomen is nondistended, soft and nontender. Normal bowel sounds heard. Extremities: No cyanosis, clubbing; trace lower extremity edema present Central nervous system: Awake extremities no to respond, poor historian.  Left hemiplegia with left upper extremity and lower extremity contractures  skin: No rashes, lesions or ulcers Psychiatry: Affect is mostly flat.  No signs of agitation currently.  Data Reviewed: I have personally reviewed following labs and imaging studies  CBC: Recent Labs  Lab 02/11/22 0207 02/11/22 0223 02/12/22 0420  WBC 6.1  --  6.8  NEUTROABS 3.7  --   --   HGB 15.1 14.3 12.6*  HCT 46.6 42.0 37.2*  MCV 88.3  --  86.3  PLT 178  --  921*   Basic Metabolic Panel: Recent Labs  Lab 02/11/22 0207 02/11/22 0223 02/12/22 0420  NA 138 139 139  K 4.2 7.1* 3.7  CL 109 108 110  CO2 25  --  22  GLUCOSE 101* 96 83  BUN 18 28* 17  CREATININE 0.83 0.70 0.85  CALCIUM 9.3  --  8.5*   GFR: CrCl cannot be calculated (Unknown ideal weight.). Liver Function Tests: Recent Labs  Lab 02/11/22 0207  AST 32  ALT 30  ALKPHOS 98  BILITOT 0.8  PROT 7.0  ALBUMIN 3.3*   No results  for input(s): "LIPASE", "AMYLASE" in the last 168 hours. No results for input(s): "AMMONIA" in the last 168 hours. Coagulation Profile: Recent Labs  Lab 02/11/22 0207  INR 1.0   Cardiac Enzymes: No results for input(s): "CKTOTAL", "CKMB", "CKMBINDEX", "TROPONINI" in the last 168 hours. BNP (last 3 results) No results for input(s): "PROBNP" in the last 8760 hours. HbA1C: No results for input(s): "HGBA1C"  in the last 72 hours. CBG: No results for input(s): "GLUCAP" in the last 168 hours. Lipid Profile: No results for input(s): "CHOL", "HDL", "LDLCALC", "TRIG", "CHOLHDL", "LDLDIRECT" in the last 72 hours. Thyroid Function Tests: No results for input(s): "TSH", "T4TOTAL", "FREET4", "T3FREE", "THYROIDAB" in the last 72 hours. Anemia Panel: No results for input(s): "VITAMINB12", "FOLATE", "FERRITIN", "TIBC", "IRON", "RETICCTPCT" in the last 72 hours. Sepsis Labs: Recent Labs  Lab 02/11/22 1052 02/11/22 1304 02/11/22 1308  PROCALCITON  --   --  2.15  LATICACIDVEN 1.4 1.2  --     Recent Results (from the past 240 hour(s))  Resp Panel by RT-PCR (Flu A&B, Covid) Anterior Nasal Swab     Status: None   Collection Time: 02/11/22  2:30 AM   Specimen: Anterior Nasal Swab  Result Value Ref Range Status   SARS Coronavirus 2 by RT PCR NEGATIVE NEGATIVE Final    Comment: (NOTE) SARS-CoV-2 target nucleic acids are NOT DETECTED.  The SARS-CoV-2 RNA is generally detectable in upper respiratory specimens during the acute phase of infection. The lowest concentration of SARS-CoV-2 viral copies this assay can detect is 138 copies/mL. A negative result does not preclude SARS-Cov-2 infection and should not be used as the sole basis for treatment or other patient management decisions. A negative result may occur with  improper specimen collection/handling, submission of specimen other than nasopharyngeal swab, presence of viral mutation(s) within the areas targeted by this assay, and inadequate number of viral copies(<138 copies/mL). A negative result must be combined with clinical observations, patient history, and epidemiological information. The expected result is Negative.  Fact Sheet for Patients:  EntrepreneurPulse.com.au  Fact Sheet for Healthcare Providers:  IncredibleEmployment.be  This test is no t yet approved or cleared by the [place] FDA and   has been authorized for detection and/or diagnosis of SARS-CoV-2 by FDA under an Emergency Use Authorization (EUA). This EUA will remain  in effect (meaning this test can be used) for the duration of the COVID-19 declaration under Section 564(b)(1) of the Act, 21 U.S.C.section 360bbb-3(b)(1), unless the authorization is terminated  or revoked sooner.       Influenza A by PCR NEGATIVE NEGATIVE Final   Influenza B by PCR NEGATIVE NEGATIVE Final    Comment: (NOTE) The Xpert Xpress SARS-CoV-2/FLU/RSV plus assay is intended as an aid in the diagnosis of influenza from Nasopharyngeal swab specimens and should not be used as a sole basis for treatment. Nasal washings and aspirates are unacceptable for Xpert Xpress SARS-CoV-2/FLU/RSV testing.  Fact Sheet for Patients: EntrepreneurPulse.com.au  Fact Sheet for Healthcare Providers: IncredibleEmployment.be  This test is not yet approved or cleared by the [place] FDA and has been authorized for detection and/or diagnosis of SARS-CoV-2 by FDA under an Emergency Use Authorization (EUA). This EUA will remain in effect (meaning this test can be used) for the duration of the COVID-19 declaration under Section 564(b)(1) of the Act, 21 U.S.C. section 360bbb-3(b)(1), unless the authorization is terminated or revoked.  Performed at [facility], [place] [address] [place], [place] [address]   Urine Culture  Status: Abnormal (Preliminary result)   Collection Time: 02/11/22 10:20 AM   Specimen: Urine, Clean Catch  Result Value Ref Range Status   Specimen Description URINE, CLEAN CATCH  Final   Special Requests NONE  Final   Culture (A)  Final    >=100,000 COLONIES/mL ENTEROCOCCUS FAECALIS SUSCEPTIBILITIES TO FOLLOW Performed at [facility], [place] [address] [place], [place] [address]    Report Status PENDING  Incomplete  Culture, blood (routine x 2)     Status: None (Preliminary result)    Collection Time: 02/11/22 10:52 AM   Specimen: BLOOD  Result Value Ref Range Status   Specimen Description BLOOD SITE NOT SPECIFIED  Final   Special Requests   Final    BOTTLES DRAWN AEROBIC AND ANAEROBIC Blood Culture adequate volume   Culture   Final    NO GROWTH < 24 HOURS Performed at [facility], [place] [address], [place], [place] [address]    Report Status PENDING  Incomplete  Culture, blood (routine x 2)     Status: None (Preliminary result)   Collection Time: 02/11/22 10:53 AM   Specimen: BLOOD LEFT WRIST  Result Value Ref Range Status   Specimen Description BLOOD LEFT WRIST  Final   Special Requests   Final    BOTTLES DRAWN AEROBIC AND ANAEROBIC Blood Culture adequate volume   Culture   Final    NO GROWTH < 24 HOURS Performed at [facility], [place] [address], [place],  [address]    Report Status PENDING  Incomplete         Radiology Studies: CT Renal Stone Study  Result Date: 02/11/2022 CLINICAL DATA:  Flank pain.  Nephrolithiasis. EXAM: CT ABDOMEN AND PELVIS WITHOUT CONTRAST TECHNIQUE: Multidetector CT imaging of the abdomen and pelvis was performed following the standard protocol without IV contrast. RADIATION DOSE REDUCTION: This exam was performed according to the departmental dose-optimization program which includes automated exposure control, adjustment of the mA and/or kV according to patient size and/or use of iterative reconstruction technique. COMPARISON:  None Available. FINDINGS: Lower chest: No acute findings. Hepatobiliary: No mass visualized on this unenhanced exam. Prior cholecystectomy. No evidence of biliary obstruction. Pancreas: No mass or inflammatory process visualized on this unenhanced exam. Spleen:  Within normal limits in size. Adrenals/Urinary tract: Renal vascular calcification noted bilaterally. Probable 3 mm nonobstructing calculus noted in upper pole of right kidney. No evidence of ureteral calculi or hydronephrosis. Diffuse  bladder wall thickening is again demonstrated. Several bladder calculi are again seen, largest measuring 8 mm. Stomach/Bowel: Large hiatal hernia is again seen containing the entire stomach. No evidence of obstruction, inflammatory process, or abnormal fluid collections. A moderate right inguinal hernia is seen containing several small bowel loops, however there is no evidence of bowel obstruction or strangulation. Small left inguinal hernia is again seen which contains only fat. Large amount of stool is seen, particularly in the rectum, similar to prior exam. Vascular/Lymphatic: No pathologically enlarged lymph nodes identified. No evidence of abdominal aortic aneurysm. Aortic atherosclerotic calcification incidentally noted. Reproductive:  No mass or other significant abnormality. Other:  None. Musculoskeletal:  No suspicious bone lesions identified. IMPRESSION: Probable 3 mm nonobstructing right renal calculus. No evidence of ureteral calculi, hydronephrosis, or other acute findings. 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 Several small bladder calculi again noted. Stable large hiatal hernia. Moderate right inguinal hernia containing several small bowel loops. No evidence of bowel obstruction or strangulation. Stable small left  inguinal hernia, which contains only fat. Electronically Signed   By: Marlaine Hind M.D.   On: 02/11/2022 11:05   MR BRAIN WO CONTRAST  Result Date: 02/11/2022 CLINICAL DATA:  81 year old male right side arm and leg weakness. EXAM: MRI HEAD WITHOUT CONTRAST TECHNIQUE: Multiplanar, multiecho pulse sequences of the brain and surrounding structures were obtained without intravenous contrast. COMPARISON:  Head CT 0250 hours today.  Brain MRI 11/18/2020. FINDINGS: Brain: No restricted diffusion or evidence of acute infarction. Dysplastic appearance of the corpus callosum with superimposed or associated chronic small vessel disease redemonstrated. Chronic  lacunar-type infarcts and small vessel ischemia in the bilateral cerebral white matter, bilateral deep gray nuclei, left brainstem. Stable cerebral volume. Stable chronic microhemorrhage in the right parietal lobe. No midline shift, mass effect, evidence of mass lesion, ventriculomegaly, extra-axial collection or acute intracranial hemorrhage. Cervicomedullary junction and pituitary are within normal limits. Vascular: Major intracranial vascular flow voids are stable. Generalized intracranial artery tortuosity. Skull and upper cervical spine: Stable. Visualized bone marrow signal is within normal limits. Sinuses/Orbits: Postoperative changes to both globes. Stable left maxillary sinus retention cyst. Other: Visible internal auditory structures appear normal. Mastoids remain clear. Negative visible scalp and face. IMPRESSION: 1. No acute intracranial abnormality. 2. Advanced chronic small vessel disease, including in the left brainstem. 3. 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 Electronically Signed   By: Genevie Ann M.D.   On: 02/11/2022 07:22   CT HEAD WO CONTRAST  Result Date: 02/11/2022 CLINICAL DATA:  Neuro deficit; acute stroke suspected EXAM: CT HEAD WITHOUT CONTRAST TECHNIQUE: Contiguous axial images were obtained from the base of the skull through the vertex without intravenous contrast. RADIATION DOSE REDUCTION: This exam was performed according to the departmental dose-optimization program which includes automated exposure control, adjustment of the mA and/or kV according to patient size and/or use of iterative reconstruction technique. COMPARISON:  CT head 10/12/2021 FINDINGS: Brain: No intracranial hemorrhage, mass effect, or evidence of acute infarct. No hydrocephalus. No extra-axial fluid collection. Generalized cerebral atrophy. Ill-defined hypoattenuation within the cerebral white matter is nonspecific but consistent with chronic small vessel ischemic disease. Chronic  infarct right thalamus. Callosal dysgenesis. Vascular: No hyperdense vessel. Intracranial arterial calcification. Skull: No fracture or focal lesion. Sinuses/Orbits: No acute finding. Mucous retention cyst left maxillary sinus. Other: None. IMPRESSION: No acute intracranial abnormality. Age-related atrophy and chronic microvascular ischemic change. Electronically Signed   By: Placido Sou M.D.   On: 02/11/2022 03:17        Scheduled Meds:  atorvastatin  40 mg Oral QPM   baclofen  20 mg Oral TID   clopidogrel  75 mg Oral QPM   Dextromethorphan-quiNIDine  1 capsule Oral QPM   enoxaparin (LOVENOX) injection  40 mg Subcutaneous Q24H   fesoterodine  4 mg Oral Daily   gabapentin  100 mg Oral TID   guaiFENesin  600 mg Oral BID   loratadine  10 mg Oral QPM   pantoprazole  20 mg Oral Daily   polyvinyl alcohol  1 drop Both Eyes TID   sertraline  100 mg Oral Daily   sodium chloride flush  3 mL Intravenous Q12H   tamsulosin  0.4 mg Oral QHS   traMADol  50 mg Oral Q12H   Continuous Infusions:  cefTRIAXone (ROCEPHIN)  IV 1 g (02/12/22 1009)   lactated ringers 100 mL/hr at 02/12/22 0258          Aline August, MD [facility] 02/12/2022, 11:29 AM

## 2022-02-12 NOTE — Evaluation (Signed)
Physical Therapy Evaluation & Discharge Patient Details Name: Gregory Kerr MRN: [id number] DOB: 1940-07-19 Today's Date: 02/12/2022  History of Present Illness  Pt is a 81 y.o. male admitted from [facility] on 02/11/22 with generalized weakness. Workup for UTI, SIRS. Brain MRI negative for acute abnormality. PMH includes CVA (2016 w/ residual L hemiplegia), HTN, HLD, hernia, L retinal detachment, muscle spasticity, osteoporosis.   Clinical Impression  Patient evaluated by Physical Therapy with no further acute PT needs identified. PTA, pt long-term care SNF resident, reports primarily bedbound with occasional use for hoyer lift OOB, though this is painful due to contractures; pt dependent for bed-level ADLs. Today, pt requiring totalA for bed mobility and washup due to bladder/bowel incontinence. Pt pleasant, able to make needs known and help direct care. All education has been completed and the patient has no further questions. Acute PT is signing off. Thank you for this referral.     Recommendations for follow up therapy are one component of a multi-disciplinary discharge planning process, led by the attending physician.  Recommendations may be updated based on patient status, additional functional criteria and insurance authorization.  Follow Up Recommendations Skilled nursing-short term rehab (<3 hours/day) Can patient physically be transported by private vehicle: No    Assistance Recommended at Discharge Intermittent Supervision/Assistance  Patient can return home with the following  Two people to help with walking and/or transfers;Two people to help with bathing/dressing/bathroom    Equipment Recommendations None recommended by PT  Recommendations for Other Services       Functional Status Assessment Patient has not had a recent decline in their functional status     Precautions / Restrictions Precautions Precautions: Fall;Other (comment) Precaution Comments: L side hemiplegia;  bladder/bowel incontinence (pt reports bladder spasms)      Mobility  Bed Mobility Overal bed mobility: Needs Assistance Bed Mobility: Rolling Rolling: Total assist, +2 for physical assistance         General bed mobility comments: totalA+2 for multiple rolls R/L for pericare, washup and linen change; totalA to scoot up in bed and placed in modified chair position for drinking    Transfers                   General transfer comment: will require maximove lift for OOB - pt reports poor tolerance as "too painful"    Ambulation/Gait                  Stairs            Wheelchair Mobility    Modified Rankin (Stroke Patients Only)       Balance                                             Pertinent Vitals/Pain Pain Assessment Pain Assessment: Faces Faces Pain Scale: Hurts little more Pain Location: R shoulder with AAROM; attempts at LUE PROM Pain Descriptors / Indicators: Discomfort, Grimacing, Guarding Pain Intervention(s): Monitored during session, Limited activity within patient's tolerance, Repositioned    Home Living Family/patient expects to be discharged to:: Skilled nursing facility                   Additional Comments: LTC resident at [facility]    Prior Function Prior Level of Function : Needs assist  Mobility Comments: pt reports primarily bedbound. staff uses hoyer lift for OOB, but this does not occur daily ADLs Comments: bed level bathing by nursing staff. pt reports he uses RUE to stretch L fingers and cleans L palm daily     Hand Dominance        Extremity/Trunk Assessment   Upper Extremity Assessment Upper Extremity Assessment: RUE deficits/detail;LUE deficits/detail RUE Deficits / Details: Generalized RUE weakness and stiffness </ 3/5 strength observed LUE Deficits / Details: h/o L-side hemiplegia with significant LUE flexor contractures, painful with attempts to  stretch/reposition; able to tolerate some L finger/wrist flexion    Lower Extremity Assessment Lower Extremity Assessment: RLE deficits/detail;LLE deficits/detail RLE Deficits / Details: Generalized RLE weakness and stiffness </ 3/5 strength observed LLE Deficits / Details: h/o L-side hemiplegia with LLE contacted with hip/knee extension and ankle PF       Communication   Communication: Expressive difficulties (soft voice)  Cognition Arousal/Alertness: Awake/alert Behavior During Therapy: WFL for tasks assessed/performed Overall Cognitive Status: Within Functional Limits for tasks assessed                                 General Comments: WFL for simple tasks; answering questions appropriately and following simple commands best he can with hemiplegia        General Comments General comments (skin integrity, edema, etc.): pt with bladder/bowel incontinence, RN present to assist with washup. pt declines OOB transfer via maximove lift secondary to pain    Exercises     Assessment/Plan    PT Assessment All further PT needs can be met in the next venue of care  PT Problem List Decreased strength;Decreased range of motion;Decreased activity tolerance;Decreased balance;Decreased mobility;Impaired tone;Pain       PT Treatment Interventions      PT Goals (Current goals can be found in the Care Plan section)       Frequency       Co-evaluation               AM-PAC PT "6 Clicks" Mobility  Outcome Measure Help needed turning from your back to your side while in a flat bed without using bedrails?: Total Help needed moving from lying on your back to sitting on the side of a flat bed without using bedrails?: Total Help needed moving to and from a bed to a chair (including a wheelchair)?: Total Help needed standing up from a chair using your arms (e.g., wheelchair or bedside chair)?: Total Help needed to walk in hospital room?: Total Help needed climbing 3-5  steps with a railing? : Total 6 Click Score: 6    End of Session   Activity Tolerance: Patient tolerated treatment well Patient left: in bed;with call bell/phone within reach;with nursing/sitter in room Nurse Communication: Mobility status;Need for lift equipment PT Visit Diagnosis: Other abnormalities of gait and mobility (R26.89);Other symptoms and signs involving the nervous system (R29.898)    Time: 1200-1226 PT Time Calculation (min) (ACUTE ONLY): 26 min   Charges:   PT Evaluation $PT Eval Moderate Complexity: 1 Mod        Jaclyn Redman, PT, DPT Acute Rehabilitation Services  Personal: Secure Chat Rehab Office: [phone]  Jaclyn L Redman 02/12/2022, 2:33 PM  

## 2022-02-12 NOTE — Progress Notes (Signed)
   02/12/22 1735  Assess: MEWS Score  Temp 98.3 F (36.8 C)  BP (!) 199/115  MAP (mmHg) 138  Pulse Rate (!) 130  ECG Heart Rate (!) 130  Resp (!) 30  Level of Consciousness Alert  SpO2 92 %  O2 Device Nasal Cannula  Patient Activity (if Appropriate) In bed  O2 Flow Rate (L/min) 3 L/min  Assess: MEWS Score  MEWS Temp 0  MEWS Systolic 0  MEWS Pulse 3  MEWS RR 2  MEWS LOC 0  MEWS Score 5  MEWS Score Color Red  Assess: if the MEWS score is Yellow or Red  Were vital signs taken at a resting state? Yes  Focused Assessment Change from prior assessment (see assessment flowsheet)  Does the patient meet 2 or more of the SIRS criteria? Yes  Does the patient have a confirmed or suspected source of infection? Yes  Provider and Rapid Response Notified? Yes  MEWS guidelines implemented *See Row Information* Yes  Treat  MEWS Interventions Escalated (See documentation below)  Pain Scale 0-10  Pain Score 10  Pain Type Acute pain  Pain Location Bladder  Pain Descriptors / Indicators Spasm  Pain Frequency Constant  Pain Onset Progressive  Patients Stated Pain Goal 0  Pain Intervention(s) MD notified (Comment)  Take Vital Signs  Increase Vital Sign Frequency  Red: Q 1hr X 4 then Q 4hr X 4, if remains red, continue Q 4hrs  Escalate  MEWS: Escalate Red: discuss with charge nurse/RN and provider, consider discussing with RRT  Notify: Charge Nurse/RN  Name of Charge Nurse/RN Notified Randalyn Rhea, RN  Date Charge Nurse/RN Notified 02/12/22  Time Charge Nurse/RN Notified 1816  Notify: Provider  Provider Name/Title Dr Starla Link  Date Provider Notified 02/12/22  Time Provider Notified 1745  Method of Notification Page  Notification Reason Change in status  Provider response See new orders  Date of Provider Response 02/12/22  Time of Provider Response 1745  Notify: Rapid Response  Name of Rapid Response RN Notified N/A  Document  Patient Outcome Stabilized after interventions (Medications  given as ordered. Cont to monitor.)  Progress note created (see row info) Yes  Assess: SIRS CRITERIA  SIRS Temperature  0  SIRS Pulse 1  SIRS Respirations  1  SIRS WBC 1  SIRS Score Sum  3

## 2022-02-12 NOTE — Evaluation (Signed)
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   Pneumonia    "once or twice" (09/22/2014)   Retinal detachment    left eye   Stroke [facility]) 2007   "left side doesn't work now" (09/22/2014)   Past Surgical History:  Past Surgical History:  Procedure Laterality Date   APPENDECTOMY  05/27/2009   Ronnald Collum, MD   CATARACT EXTRACTION W/ INTRAOCULAR LENS  IMPLANT, BILATERAL Bilateral ?2014   CHOLECYSTECTOMY N/A 09/26/2014   Procedure: LAPAROSCOPIC CHOLECYSTECTOMY WITH INTRAOPERATIVE CHOLANGIOGRAM;  Surgeon: Donnie Mesa, MD;  Location: [place];  Service: General;  Laterality: N/A;   COLONOSCOPY WITH PROPOFOL N/A 12/29/2015   Procedure: COLONOSCOPY WITH PROPOFOL;  Surgeon: Wilford Corner, MD;  Location: [facility] ENDOSCOPY;  Service: Endoscopy;  Laterality: N/A;   ERCP N/A 09/25/2014   Procedure: ENDOSCOPIC RETROGRADE CHOLANGIOPANCREATOGRAPHY (ERCP);  Surgeon: Clarene Essex, MD;  Location: [facility] ENDOSCOPY;  Service: Endoscopy;  Laterality: N/A;   IR CATHETER TUBE CHANGE  05/24/2021   PARS PLANA VITRECTOMY Left 01/30/2019   Procedure: PARS PLANA VITRECTOMY WITH 25 GAUGE;  Surgeon: Sherlynn Stalls, MD;  Location: [place];  Service: Ophthalmology;  Laterality: Left;   PARS PLANA VITRECTOMY Right 11/13/2019   Procedure: PARS PLANA VITRECTOMY WITH 25 GAUGE WITH REMOVAL OF POSTERIOR CAPSULAR OPACIFICATION WITH VITRECTOR;  Surgeon: Sherlynn Stalls, MD;  Location: [place];  Service: Ophthalmology;  Laterality: Right;   PHOTOCOAGULATION WITH LASER Left 01/30/2019   Procedure: PHOTOCOAGULATION WITH LASER;  Surgeon: Sherlynn Stalls, MD;  Location: [place];  Service: Ophthalmology;  Laterality: Left;   HPI:  Patient is a 81 y.o. 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 MRI brain negative for acute abnormality. On admission he was tachycardic, febrile and with possible UTI.    Assessment / Plan / Recommendation  Clinical Impression  Patient currently not presenting with clinical s/s of dysphagia as per this bedside/clinical swallow evaluation. He drank several sips of thin liquids (soda) but declined to have any solids (recently had eaten) as he was starting to experience discomfort and difficulty breathing from what he described as "bladder spasms". He did exhibit significantly decreased breath support for phonation when talking with SLP and RN informed. Of note, patient was assessed for swallowing at bedside during previous admission in May of 2023 and at that time, swallow was judged by SLP to be [facility]. SLP is not recommending acute level intervention as patient does appear to be [facility] for swallow function, however if he is experiencing any difficulties, these can be addressed at his SNF. SLP Visit Diagnosis: Dysphagia, unspecified (R13.10)    Aspiration Risk  No limitations    Diet Recommendation Regular;Thin liquid   Liquid Administration via: Cup;Straw Medication Administration: Whole meds with puree Supervision: Full supervision/cueing for compensatory strategies;Staff to assist with self feeding Compensations: Slow rate;Small sips/bites  Postural Changes: Seated upright at 90 degrees    Other  Recommendations Oral Care Recommendations: Oral care BID;Staff/trained caregiver to provide oral  care    Recommendations for follow up therapy are one component of a multi-disciplinary discharge planning process, led by the attending physician.  Recommendations may be updated based on patient status, additional functional criteria and insurance authorization.  Follow up Recommendations Follow physician's recommendations for discharge plan and follow up therapies      Assistance Recommended at Discharge Frequent or constant Supervision/Assistance  Functional Status Assessment Patient has had a recent decline in their functional status and demonstrates the ability to make significant improvements in function in a reasonable and predictable amount of time.  Frequency and Duration     N/A       Prognosis    N/A    Swallow Study   General Date of Onset: 02/11/22 HPI: Patient is a 81 y.o. 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 MRI brain negative for acute abnormality. On admission he was tachycardic, febrile and with possible UTI. Type of Study: Bedside Swallow Evaluation Previous Swallow Assessment: BSE during previous admission in May of 2023 with no significant findings Diet Prior to this Study: Regular;Thin liquids Temperature Spikes Noted: No Respiratory Status: Room air History of Recent Intubation: No Behavior/Cognition: Alert;Cooperative;Pleasant mood Oral Cavity Assessment: Within Functional Limits Oral Care Completed by SLP: No Oral Cavity - Dentition: Adequate natural dentition Vision: Functional for self-feeding Self-Feeding Abilities: Total assist Patient Positioning: Upright in bed Baseline Vocal Quality: Low vocal intensity Volitional Cough: Strong Volitional Swallow: Able to elicit    Oral/Motor/Sensory Function Overall Oral Motor/Sensory Function: Other (comment) (mild left sided facial  weakness from prior CVA)   Ice Chips     Thin Liquid Thin Liquid: Within functional limits Presentation: Straw    Nectar Thick     Honey Thick     Puree Puree: Not tested   Solid     Solid: Not tested     Sonia Baller, MA, CCC-SLP Speech Therapy

## 2022-02-12 NOTE — Progress Notes (Signed)
Pt  c/o bladder spasms, constipation with tachycardia, tachypnea, hypertension - BP 197/95, sinus tach 107  and increased O2 requirement of 4L to maintain sat > 90%, Temp 98.2.. Bladder scan with 575 cc, lungs diminished with exp wheezes - pt requesting miralax.  Dr. Myna Hidalgo notified.  Awaiting further orders.

## 2022-02-13 DIAGNOSIS — Z515 Encounter for palliative care: Secondary | ICD-10-CM

## 2022-02-13 DIAGNOSIS — G8114 Spastic hemiplegia affecting left nondominant side: Secondary | ICD-10-CM | POA: Diagnosis not present

## 2022-02-13 DIAGNOSIS — F482 Pseudobulbar affect: Secondary | ICD-10-CM

## 2022-02-13 DIAGNOSIS — Z7189 Other specified counseling: Secondary | ICD-10-CM

## 2022-02-13 DIAGNOSIS — I6930 Unspecified sequelae of cerebral infarction: Secondary | ICD-10-CM

## 2022-02-13 DIAGNOSIS — R6510 Systemic inflammatory response syndrome (SIRS) of non-infectious origin without acute organ dysfunction: Secondary | ICD-10-CM | POA: Diagnosis not present

## 2022-02-13 DIAGNOSIS — N3000 Acute cystitis without hematuria: Secondary | ICD-10-CM

## 2022-02-13 DIAGNOSIS — R531 Weakness: Secondary | ICD-10-CM | POA: Diagnosis not present

## 2022-02-13 LAB — CBC WITH DIFFERENTIAL/PLATELET
Abs Immature Granulocytes: 0.02 10*3/uL (ref 0.00–0.07)
Basophils Absolute: 0 10*3/uL (ref 0.0–0.1)
Basophils Relative: 0 %
Eosinophils Absolute: 0 10*3/uL (ref 0.0–0.5)
Eosinophils Relative: 0 %
HCT: 44.3 % (ref 39.0–52.0)
Hemoglobin: 14.8 g/dL (ref 13.0–17.0)
Immature Granulocytes: 0 %
Lymphocytes Relative: 13 %
Lymphs Abs: 1 10*3/uL (ref 0.7–4.0)
MCH: 29.1 pg (ref 26.0–34.0)
MCHC: 33.4 g/dL (ref 30.0–36.0)
MCV: 87 fL (ref 80.0–100.0)
Monocytes Absolute: 0.8 10*3/uL (ref 0.1–1.0)
Monocytes Relative: 10 %
Neutro Abs: 5.7 10*3/uL (ref 1.7–7.7)
Neutrophils Relative %: 77 %
Platelets: 150 10*3/uL (ref 150–400)
RBC: 5.09 MIL/uL (ref 4.22–5.81)
RDW: 13.6 % (ref 11.5–15.5)
WBC: 7.5 10*3/uL (ref 4.0–10.5)
nRBC: 0 % (ref 0.0–0.2)

## 2022-02-13 LAB — BASIC METABOLIC PANEL
Anion gap: 10 (ref 5–15)
BUN: 16 mg/dL (ref 8–23)
CO2: 22 mmol/L (ref 22–32)
Calcium: 9.4 mg/dL (ref 8.9–10.3)
Chloride: 109 mmol/L (ref 98–111)
Creatinine, Ser: 0.94 mg/dL (ref 0.61–1.24)
GFR, Estimated: >60 mL/min (ref >60)
Glucose, Bld: 131 mg/dL — ABNORMAL HIGH (ref 70–99)
Potassium: 3.8 mmol/L (ref 3.5–5.1)
Sodium: 141 mmol/L (ref 135–145)

## 2022-02-13 LAB — MAGNESIUM: Magnesium: 1.8 mg/dL (ref 1.7–2.4)

## 2022-02-13 MED ORDER — FUROSEMIDE 10 MG/ML IJ SOLN
40.0000 mg | Freq: Once | INTRAMUSCULAR | Status: AC
  Administered 2022-02-13: 40 mg via INTRAVENOUS
  Filled 2022-02-13: qty 4

## 2022-02-13 NOTE — Progress Notes (Signed)
Pt was able to void an additional 500 cc of urine.  BP 135/74, HR 70s, pt denies any c/o pain or bladder spasms.  States he feels better this am.

## 2022-02-13 NOTE — Consult Note (Signed)
Consultation Note Date: 02/13/2022   Patient Name: Gregory Kerr  DOB: 1940/08/22  MRN: [id number]  Age / Sex: 81 y.o., male  PCP: Gregory Bouquet, MD Referring Physician: Aline August, MD  Reason for Consultation: Establishing goals of care  HPI/Patient Profile: 81 y.o. male   admitted on 02/11/2022 with medical history significant of HTN, HLD, and prior CVA with residual L hemiplegia presented from SNF with generalized weakness.  On presentation, MRI of brain was negative for acute stroke.  He was tachycardic, febrile with possible UTI.  He was started on IV fluids and antibiotics.  Patient is a long-term care resident at [facility] for over 10 years.   Patient is been bedbound and dependent for most ADLs.    Patient faces treatment option decisions, advanced directive decisions and anticipatory care needs.   Clinical Assessment and Goals of Care:  This NP Gregory Kerr reviewed medical records, received report from team, assessed the patient and then meet at the patient's bedside  to discuss diagnosis, prognosis, GOC, EOL wishes disposition and options.  Patient is alert and oriented and easily engages in conversation regarding his current medical situation and treatment plan.   Concept of Palliative Care was introduced as specialized medical care for people and their families living with serious illness.  If focuses on providing relief from the symptoms and stress of a serious illness.  The goal is to improve quality of life for both the patient and the family.   Values and goals of care important to patient and family were attempted to be elicited.  Created space and opportunity for patient to explore thoughts and feelings regarding current medical situation.  Patient verbalized an understanding of the his multiple comorbidities and ongoing physical and functional decline over time.  He shares a  satisfaction with his life over the last 10 years at the skilled nursing facility he tells me that he does not really participate in activities but spends most of his days watching TV.  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   A  discussion was had today regarding advanced directives.  Concepts specific to code status, artifical feeding and hydration, continued IV antibiotics and rehospitalization was had.    Patient verbalizes a desire to limit life prolonging measures specific to status and artificial feeding.  Patient gives me permission to speak with his next of kin and only relatives and support person [place].  We will wait for documentation of advance directives until I have the opportunity speak with his sister. To wife    MOST form introduced    Questions and concerns addressed.  Patient  encouraged to call with questions or concerns.     PMT will continue to support holistically. Take catheter to relates that risk  Patient's sister/next of kin/Gregory Kerr     SUMMARY OF RECOMMENDATIONS    Code Status/Advance Care Planning: Full code Educated patient to consider DNR/DNI status understanding evidenced based poor outcomes in similar  hospitalized patient, as the cause of arrest is likely associated with advanced chronic illness rather than an easily reversible acute cardio-pulmonary event.     Palliative Prophylaxis:  Aspiration, Bowel Regimen, and Delirium Protocol  Additional Recommendations (Limitations, Scope, Preferences): Avoid Hospitalization  Psycho-social/Spiritual:  Desire for further Chaplaincy support:no   Prognosis:  Unable to determine  Discharge Planning: To Be Determined      Primary Diagnoses: Present on Admission:  SIRS (systemic inflammatory response syndrome) (HCC)  UTI (urinary tract infection)  Prolonged QT interval  Left spastic hemiplegia (HCC)  Pseudobulbar affect   Hyperlipidemia LDL goal <100  HTN (hypertension)   I have reviewed the medical record, interviewed the patient and family, and examined the patient. The following aspects are pertinent.  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   Pneumonia    "once or twice" (09/22/2014)   Retinal detachment    left eye   Stroke [facility]) 2007   "left side doesn't work now" (09/22/2014)   Social History   Socioeconomic History   Marital status: Single    Spouse name: Not on file   Number of children: Not on file   Years of education: Not on file   Highest education level: Not on file  Occupational History   Not on file  Tobacco Use   Smoking status: Former    Packs/day: 1.00    Types: Cigarettes    Quit date: 12/20/2005    Years since quitting: 16.1   Smokeless tobacco: Never  Vaping Use   Vaping Use: Never used  Substance and Sexual Activity   Alcohol use: Not Currently    Comment: 09/22/2014 "stopped in 2007; drank occasionally when I did drink"   Drug use: No   Sexual activity: Never  Other Topics Concern   Not on file  Social History Narrative   Full time resident at [employer]   Right handed   Drinks caffeine occassionally   Social Determinants of Health   Financial Resource Strain: Not on file  Food Insecurity: Not on file  Transportation Needs: Not on file  Physical Activity: Not on file  Stress: Not on file  Social Connections: Not on file   Family History  Problem Relation Age of Onset   Heart attack Mother    Heart attack Father    Cancer Maternal Aunt        Stomach   Cancer Paternal Aunt        Uterine,Colon   Scheduled Meds:  atorvastatin  40 mg Oral QPM   baclofen  20 mg Oral TID    clopidogrel  75 mg Oral QPM   Dextromethorphan-quiNIDine  1 capsule Oral QPM   enoxaparin (LOVENOX) injection  40 mg Subcutaneous Q24H   fesoterodine  4 mg Oral Daily   gabapentin  100 mg Oral TID   guaiFENesin  600 mg Oral BID   loratadine  10 mg Oral QPM   pantoprazole  20 mg Oral Daily   polyvinyl alcohol  1 drop Both Eyes TID   sertraline  100 mg Oral Daily   sodium chloride flush  3 mL Intravenous Q12H   tamsulosin  0.4 mg Oral QHS   traMADol  50 mg  Oral Q12H   Continuous Infusions:  ampicillin (OMNIPEN) IV 1 g (02/13/22 1134)   PRN Meds:.acetaminophen **OR** acetaminophen, hydrALAZINE, metoprolol tartrate, morphine injection, phenazopyridine, polyethylene glycol, traMADol Medications Prior to Admission:  Prior to Admission medications   Medication Sig Start Date End Date Taking? Authorizing Provider  ARTIFICIAL TEAR OP Place 1 drop into both eyes 3 (three) times daily. 01/10/22  Yes [provider]  atorvastatin (LIPITOR) 40 MG tablet Take 1 tablet (40 mg total) by mouth daily. Patient taking differently: Take 40 mg by mouth every evening. 11/22/20  Yes Lyndal Pulley, MD  baclofen (LIORESAL) 20 MG tablet Take 20 mg by mouth 3 (three) times daily.   Yes [provider]  bisacodyl (DULCOLAX) 10 MG suppository Place 10 mg rectally daily as needed for moderate constipation. 10/18/21  Yes [provider]  calcium-vitamin D (OSCAL WITH D) 500-5 MG-MCG tablet Take 1 tablet by mouth 2 (two) times daily.   Yes [provider]  clopidogrel (PLAVIX) 75 MG tablet Take 1 tablet (75 mg total) by mouth daily. 11/22/20  Yes Lyndal Pulley, MD  clotrimazole-betamethasone (LOTRISONE) cream Apply 1 Application topically 2 (two) times daily. Thin layer, topical, twice a day **Apply to head of penis and foreskin x 2 weeks** 02/03/22 02/17/22 Yes [provider]  Dextromethorphan-quiNIDine (NUEDEXTA) 20-10 MG capsule Take 1 capsule by mouth every evening. For  Pseudobulbar Affect (PBA)   Yes [provider]  gabapentin (NEURONTIN) 100 MG capsule Take 100 mg by mouth 3 (three) times daily.   Yes [provider]  guaiFENesin (MUCINEX) 600 MG 12 hr tablet Take 600 mg by mouth 2 (two) times daily.   Yes [provider]  loperamide (IMODIUM A-D) 2 MG tablet Take 4 mg by mouth 4 (four) times daily as needed for diarrhea or loose stools.   Yes [provider]  loratadine (CLARITIN) 10 MG tablet Take 10 mg by mouth every evening.   Yes [provider]  nitroGLYCERIN (NITROSTAT) 0.4 MG SL tablet Place 0.4 mg under the tongue every 5 (five) minutes as needed for chest pain.   Yes [provider]  nystatin (MYCOSTATIN/NYSTOP) powder Apply 1 application  topically 3 (three) times daily. 1 application TID to suprapubic area, bilateral groin and penis 01/10/22  Yes [provider]  pantoprazole (PROTONIX) 20 MG tablet Take 20 mg by mouth daily. 11/17/20  Yes [provider]  phenazopyridine (PYRIDIUM) 100 MG tablet Take 100 mg by mouth every 8 (eight) hours as needed (bladder spasms).   Yes [provider]  polyethylene glycol (MIRALAX / GLYCOLAX) packet Take 17 g by mouth daily as needed (constipation).    Yes [provider]  Prenatal Vit-Fe Fumarate-FA (PREPLUS) 27-1 MG TABS Take 1 tablet by mouth daily.   Yes [provider]  saccharomyces boulardii (FLORASTOR) 250 MG capsule Take 250 mg by mouth every 12 (twelve) hours.   Yes [provider]  sertraline (ZOLOFT) 100 MG tablet Take 100 mg by mouth daily.   Yes [provider]  solifenacin (VESICARE) 5 MG tablet Take 1 tablet (5 mg total) by mouth daily. 11/21/20  Yes Lyndal Pulley, MD  tamsulosin (FLOMAX) 0.4 MG CAPS capsule Take 1 capsule (0.4 mg total) by mouth at bedtime. 11/21/20  Yes Lyndal Pulley, MD  traMADol (ULTRAM) 50 MG tablet Take 50 mg by mouth See admin instructions. Take 50 mg twice daily  scheduled, may take a 3rd 50 mg dose as needed for  pain   Yes [provider]  trimethoprim (TRIMPEX) 100 MG tablet Take 100 mg by mouth daily. **No stop date** per [facility] 02/03/22  Yes [provider]  PROLIA 60 MG/ML SOSY injection Inject 60 mg into the skin every 6 (six) months. 09/07/20   [provider]   Allergies  Allergen Reactions   Penicillins Rash    ++++Patient in nursing home. Unable to clarify details/severity of the reaction.++++   Review of Systems  Constitutional:  Positive for fatigue.  Neurological:  Positive for weakness.    Physical Exam Cardiovascular:     Rate and Rhythm: Normal rate.  Skin:    General: Skin is warm and dry.  Neurological:     Mental Status: He is alert and oriented to person, place, and time.     Vital Signs: BP 124/63 (BP Location: Right Arm)   Pulse (!) 59   Temp (!) 97.5 F (36.4 C) (Axillary)   Resp 16   Wt 74.8 kg   SpO2 97%   BMI 24.35 kg/m  Pain Scale: 0-10   Pain Score: 0-No pain   SpO2: SpO2: 97 % O2 Device:SpO2: 97 % O2 Flow Rate: .O2 Flow Rate (L/min): 5 L/min  IO: Intake/output summary:  Intake/Output Summary (Last 24 hours) at 02/13/2022 1354 Last data filed at 02/13/2022 1300 Gross per 24 hour  Intake 800 ml  Output 1400 ml  Net -600 ml    LBM: Last BM Date : 02/12/22 Baseline Weight: Weight: 74.8 kg Most recent weight: Weight: 74.8 kg     Palliative Assessment/Data: 40%    Discussed with Dr Starla Link and [facility] team   Signed by: Gregory Lessen, NP   Please contact Palliative Medicine Team phone at [phone] for questions and concerns.  For individual provider: See Shea Evans

## 2022-02-13 NOTE — Progress Notes (Signed)
[facility]) Hospital liaison note  Notified by [facility] Milas Gain, LCSW, of patient/family request for [facility] Palliative services at home after discharge.  [facility] hospital liaison will follow patient for discharge disposition.  Please call with any hospice or outpatient palliative care related questions.   Thank you for the opportunity to participate in this patient's care.  [place]  [facility] liaison  [phone]

## 2022-02-13 NOTE — Progress Notes (Signed)
PROGRESS NOTE    Gregory Kerr  GUY:[id number] DOB: [birth year] DOA: 02/11/2022 PCP: Virgel Bouquet, MD   Brief Narrative:  81 y.o. male with medical history significant of HTN, HLD, and prior CVA with residual L hemiplegia presented from SNF with generalized weakness.  On presentation, MRI of brain was negative for acute stroke.  He was tachycardic, febrile with possible UTI.  He was started on IV fluids and antibiotics.  Assessment & Plan:   UTI: Present on admission SIRS -Presented with fever, tachycardia, tachypnea with possible UTI. -Blood cultures negative so far.  Urine cultures growing Enterococcus faecalis, follow sensitivities.  Switched Rocephin to ampicillin  Generalized weakness -MRI of brain was negative for acute stroke.  PT recommends return back to SNF.  Social worker consulted.  History of CVA, unspecified with residual left hemiplegia -Patient is nonambulatory. -Continue Plavix, baclofen, tramadol, gabapentin, sertraline -Palliative care for goals of care discussion  Hypertension -Blood pressure intermittently on the higher side.  Monitor.  Might have to start antihypertensives if blood pressure continues to remain elevated.  Hyperlipidemia -Continue statin  Pseudobulbar affect -Continue Nuedexta   DVT prophylaxis: Lovenox  code Status: Full Family Communication: None at bedside Disposition Plan: Status is: Inpatient Remains inpatient appropriate because: Of severity of illness    Consultants: Palliative care  Procedures: None  Antimicrobials:  Anti-infectives (From admission, onward)    Start     Dose/Rate Route Frequency Ordered Stop   02/12/22 1300  ampicillin (OMNIPEN) 1 g in sodium chloride 0.9 % 100 mL IVPB        1 g 300 mL/hr over 20 Minutes Intravenous Every 6 hours 02/12/22 1139     02/12/22 0930  cefTRIAXone (ROCEPHIN) 1 g in sodium chloride 0.9 % 100 mL IVPB  Status:  Discontinued        1 g 200 mL/hr over 30 Minutes Intravenous  Every 24 hours 02/11/22 1213 02/12/22 1139   02/11/22 0930  cefTRIAXone (ROCEPHIN) 1 g in sodium chloride 0.9 % 100 mL IVPB        1 g 200 mL/hr over 30 Minutes Intravenous  Once 02/11/22 5638 02/11/22 1012       Subjective: Patient seen and examined at bedside.  Extremely poor historian; still very slow to respond.  No seizures or agitation or fever reported.  Patient had episodes of elevated blood pressure overnight as per nursing staff. Objective: Vitals:   02/12/22 1903 02/12/22 2031 02/12/22 2302 02/13/22 0613  BP: (!) 189/102 (!) 197/95 (!) 191/106 135/64  Pulse: (!) 112 (!) 107 (!) 104 77  Resp:  (!) 26 (!) 24 (!) 23  Temp:  98.2 F (36.8 C) 97.9 F (36.6 C) 97.7 F (36.5 C)  TempSrc:  Oral Oral Oral  SpO2: 93% 92% 90% 94%  Weight:    74.8 kg    Intake/Output Summary (Last 24 hours) at 02/13/2022 0759 Last data filed at 02/13/2022 7564 Gross per 24 hour  Intake 859.93 ml  Output 1400 ml  Net -540.07 ml    Filed Weights   02/13/22 0613  Weight: 74.8 kg    Examination:  General: On 3 to 5 L oxygen via nasal cannula.  No distress.  Looks chronically ill and deconditioned. ENT/neck: No thyromegaly.  JVD is not elevated  respiratory: Decreased breath sounds at bases bilaterally with some crackles; no wheezing.  Intermittently tachypneic CVS: S1-S2 heard, tachycardic intermittently  abdominal: Soft, nontender, slightly distended; no organomegaly, normal bowel sounds are heard Extremities: Trace lower extremity  edema; no cyanosis  CNS: Awake, slow to respond, poor historian.   Left hemiplegia with left upper extremity and lower extremity contractures  Lymph: No obvious lymphadenopathy Skin: No obvious ecchymosis/lesions  psych: Currently not agitated.  Flat affect.   Musculoskeletal: No obvious other joint swelling/deformity   Data Reviewed: I have personally reviewed following labs and imaging studies  CBC: Recent Labs  Lab 02/11/22 0207 02/11/22 0223  02/12/22 0420 02/13/22 0219  WBC 6.1  --  6.8 7.5  NEUTROABS 3.7  --   --  5.7  HGB 15.1 14.3 12.6* 14.8  HCT 46.6 42.0 37.2* 44.3  MCV 88.3  --  86.3 87.0  PLT 178  --  123* 299    Basic Metabolic Panel: Recent Labs  Lab 02/11/22 0207 02/11/22 0223 02/12/22 0420 02/13/22 0219  NA 138 139 139 141  K 4.2 7.1* 3.7 3.8  CL 109 108 110 109  CO2 25  --  22 22  GLUCOSE 101* 96 83 131*  BUN 18 28* 17 16  CREATININE 0.83 0.70 0.85 0.94  CALCIUM 9.3  --  8.5* 9.4  MG  --   --   --  1.8    GFR: Estimated Creatinine Clearance: 64.8 mL/min (by C-G formula based on SCr of 0.94 mg/dL). Liver Function Tests: Recent Labs  Lab 02/11/22 0207  AST 32  ALT 30  ALKPHOS 98  BILITOT 0.8  PROT 7.0  ALBUMIN 3.3*    No results for input(s): "LIPASE", "AMYLASE" in the last 168 hours. No results for input(s): "AMMONIA" in the last 168 hours. Coagulation Profile: Recent Labs  Lab 02/11/22 0207  INR 1.0    Cardiac Enzymes: No results for input(s): "CKTOTAL", "CKMB", "CKMBINDEX", "TROPONINI" in the last 168 hours. BNP (last 3 results) No results for input(s): "PROBNP" in the last 8760 hours. HbA1C: No results for input(s): "HGBA1C" in the last 72 hours. CBG: No results for input(s): "GLUCAP" in the last 168 hours. Lipid Profile: No results for input(s): "CHOL", "HDL", "LDLCALC", "TRIG", "CHOLHDL", "LDLDIRECT" in the last 72 hours. Thyroid Function Tests: No results for input(s): "TSH", "T4TOTAL", "FREET4", "T3FREE", "THYROIDAB" in the last 72 hours. Anemia Panel: No results for input(s): "VITAMINB12", "FOLATE", "FERRITIN", "TIBC", "IRON", "RETICCTPCT" in the last 72 hours. Sepsis Labs: Recent Labs  Lab 02/11/22 1052 02/11/22 1304 02/11/22 1308  PROCALCITON  --   --  2.15  LATICACIDVEN 1.4 1.2  --      Recent Results (from the past 240 hour(s))  Resp Panel by RT-PCR (Flu A&B, Covid) Anterior Nasal Swab     Status: None   Collection Time: 02/11/22  2:30 AM   Specimen:  Anterior Nasal Swab  Result Value Ref Range Status   SARS Coronavirus 2 by RT PCR NEGATIVE NEGATIVE Final    Comment: (NOTE) SARS-CoV-2 target nucleic acids are NOT DETECTED.  The SARS-CoV-2 RNA is generally detectable in upper respiratory specimens during the acute phase of infection. The lowest concentration of SARS-CoV-2 viral copies this assay can detect is 138 copies/mL. A negative result does not preclude SARS-Cov-2 infection and should not be used as the sole basis for treatment or other patient management decisions. A negative result may occur with  improper specimen collection/handling, submission of specimen other than nasopharyngeal swab, presence of viral mutation(s) within the areas targeted by this assay, and inadequate number of viral copies(<138 copies/mL). A negative result must be combined with clinical observations, patient history, and epidemiological information. The expected result is Negative.  Fact  Sheet for Patients:  EntrepreneurPulse.com.au  Fact Sheet for Healthcare Providers:  IncredibleEmployment.be  This test is no t yet approved or cleared by the [place] FDA and  has been authorized for detection and/or diagnosis of SARS-CoV-2 by FDA under an Emergency Use Authorization (EUA). This EUA will remain  in effect (meaning this test can be used) for the duration of the COVID-19 declaration under Section 564(b)(1) of the Act, 21 U.S.C.section 360bbb-3(b)(1), unless the authorization is terminated  or revoked sooner.       Influenza A by PCR NEGATIVE NEGATIVE Final   Influenza B by PCR NEGATIVE NEGATIVE Final    Comment: (NOTE) The Xpert Xpress SARS-CoV-2/FLU/RSV plus assay is intended as an aid in the diagnosis of influenza from Nasopharyngeal swab specimens and should not be used as a sole basis for treatment. Nasal washings and aspirates are unacceptable for Xpert Xpress SARS-CoV-2/FLU/RSV testing.  Fact  Sheet for Patients: EntrepreneurPulse.com.au  Fact Sheet for Healthcare Providers: IncredibleEmployment.be  This test is not yet approved or cleared by the [place] FDA and has been authorized for detection and/or diagnosis of SARS-CoV-2 by FDA under an Emergency Use Authorization (EUA). This EUA will remain in effect (meaning this test can be used) for the duration of the COVID-19 declaration under Section 564(b)(1) of the Act, 21 U.S.C. section 360bbb-3(b)(1), unless the authorization is terminated or revoked.  Performed at [facility], [place] [address] [place], [place] [address]   Urine Culture     Status: Abnormal (Preliminary result)   Collection Time: 02/11/22 10:20 AM   Specimen: Urine, Clean Catch  Result Value Ref Range Status   Specimen Description URINE, CLEAN CATCH  Final   Special Requests NONE  Final   Culture (A)  Final    >=100,000 COLONIES/mL ENTEROCOCCUS FAECALIS CULTURE REINCUBATED FOR BETTER GROWTH Performed at [facility], [place] [address] [place], [place] [address]    Report Status PENDING  Incomplete  Culture, blood (routine x 2)     Status: None (Preliminary result)   Collection Time: 02/11/22 10:52 AM   Specimen: BLOOD  Result Value Ref Range Status   Specimen Description BLOOD SITE NOT SPECIFIED  Final   Special Requests   Final    BOTTLES DRAWN AEROBIC AND ANAEROBIC Blood Culture adequate volume   Culture   Final    NO GROWTH < 24 HOURS Performed at [facility], [place] [address] [place], [place] [address]    Report Status PENDING  Incomplete  Culture, blood (routine x 2)     Status: None (Preliminary result)   Collection Time: 02/11/22 10:53 AM   Specimen: BLOOD LEFT WRIST  Result Value Ref Range Status   Specimen Description BLOOD LEFT WRIST  Final   Special Requests   Final    BOTTLES DRAWN AEROBIC AND ANAEROBIC Blood Culture adequate volume   Culture   Final    NO GROWTH < 24  HOURS Performed at [facility], [place] [address] [place], [place] [address]    Report Status PENDING  Incomplete         Radiology Studies: DG CHEST PORT 1 VIEW  Result Date: 02/12/2022 CLINICAL DATA:  [id number]. pt order states Acute respiratory failure with hypoxia Best obtainable image. Pt left arm/hand contracted near abdomen/ lower portion of chest at time of imaging. EXAM: PORTABLE CHEST 1 VIEW.  Patient is rotated. COMPARISON:  Chest x-ray 10/15/2021, CT abdomen pelvis 08/31/2021 FINDINGS: Limited evaluation due to patient  rotation. The heart and mediastinal contours are unchanged. Aortic calcification. Elevated left hemidiaphragm with associated large hiatal hernia. Left base passive atelectasis. Likely paramediastinal right upper lobe atelectasis. No focal consolidation. No pulmonary edema. No pleural effusion. No pneumothorax. No acute osseous abnormality. IMPRESSION: 1. No active disease with limited evaluation of the cardiomediastinal silhouette and right upper lobe due to patient rotation and likely atelectasis. Recommend repeat PA and lateral view of the chest for further evaluation. 2. Large hiatal hernia. Electronically Signed   By: Iven Finn M.D.   On: 02/12/2022 21:41   CT Renal Stone Study  Result Date: 02/11/2022 CLINICAL DATA:  Flank pain.  Nephrolithiasis. EXAM: CT ABDOMEN AND PELVIS WITHOUT CONTRAST TECHNIQUE: Multidetector CT imaging of the abdomen and pelvis was performed following the standard protocol without IV contrast. RADIATION DOSE REDUCTION: This exam was performed according to the departmental dose-optimization program which includes automated exposure control, adjustment of the mA and/or kV according to patient size and/or use of iterative reconstruction technique. COMPARISON:  None Available. FINDINGS: Lower chest: No acute findings. Hepatobiliary: No mass visualized on this unenhanced exam. Prior cholecystectomy. No evidence of biliary obstruction.  Pancreas: No mass or inflammatory process visualized on this unenhanced exam. Spleen:  Within normal limits in size. Adrenals/Urinary tract: Renal vascular calcification noted bilaterally. Probable 3 mm nonobstructing calculus noted in upper pole of right kidney. No evidence of ureteral calculi or hydronephrosis. Diffuse bladder wall thickening is again demonstrated. Several bladder calculi are again seen, largest measuring 8 mm. Stomach/Bowel: Large hiatal hernia is again seen containing the entire stomach. No evidence of obstruction, inflammatory process, or abnormal fluid collections. A moderate right inguinal hernia is seen containing several small bowel loops, however there is no evidence of bowel obstruction or strangulation. Small left inguinal hernia is again seen which contains only fat. Large amount of stool is seen, particularly in the rectum, similar to prior exam. Vascular/Lymphatic: No pathologically enlarged lymph nodes identified. No evidence of abdominal aortic aneurysm. Aortic atherosclerotic calcification incidentally noted. Reproductive:  No mass or other significant abnormality. Other:  None. Musculoskeletal:  No suspicious bone lesions identified. IMPRESSION: Probable 3 mm nonobstructing right renal calculus. No evidence of ureteral calculi, hydronephrosis, or other acute findings. 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 Several small bladder calculi again noted. Stable large hiatal hernia. Moderate right inguinal hernia containing several small bowel loops. No evidence of bowel obstruction or strangulation. Stable small left inguinal hernia, which contains only fat. Electronically Signed   By: Marlaine Hind M.D.   On: 02/11/2022 11:05        Scheduled Meds:  atorvastatin  40 mg Oral QPM   baclofen  20 mg Oral TID   clopidogrel  75 mg Oral QPM   Dextromethorphan-quiNIDine  1 capsule Oral QPM   enoxaparin (LOVENOX) injection  40 mg Subcutaneous Q24H    fesoterodine  4 mg Oral Daily   gabapentin  100 mg Oral TID   guaiFENesin  600 mg Oral BID   loratadine  10 mg Oral QPM   pantoprazole  20 mg Oral Daily   polyvinyl alcohol  1 drop Both Eyes TID   sertraline  100 mg Oral Daily   sodium chloride flush  3 mL Intravenous Q12H   tamsulosin  0.4 mg Oral QHS   traMADol  50 mg Oral Q12H   Continuous Infusions:  ampicillin (OMNIPEN) IV 1 g (02/13/22 5176)          Aline August, MD [facility] 02/13/2022, 7:59 AM

## 2022-02-13 NOTE — TOC Initial Note (Addendum)
Transition of Care [facility]) - Initial/Assessment Note    Patient Details  Name: Gregory Kerr MRN: [id number] Date of Birth: [birth year]  [facility] of [facility]) CM/SW Contact:    Milas Gain, [place] Phone Number: 02/13/2022, 4:06 PM  Clinical Narrative:                  CSW received consult for possible SNF placement at time of discharge. CSW spoke with patient regarding PT recommendation of SNF placement at time of discharge. Patient reports he comes from [facility] long term.Patient expressed understanding of PT recommendation and is agreeable to SNF placement at time of discharge.Patient confirmed plan is to return to [facility] with short term rehab.CSW discussed insurance authorization process . CSW received consult for palliative referral from [facility] NP with palliative services for palliative services to follow patient at SNF. CSW spoke with patient and patients sister who both are in agreement for CSW to make referral to [facility] for palliative services to follow patient at SNF. CSW called [facility] and spoke with Judson Roch and made referral for palliative services to follow patient at SNF.  CSW spoke with Sharyn Lull at [facility] who confirmed patient is long term there. Selinda Eon confirmed patient can return and receive short term rehab when medically ready for dc.CSW to continue to follow and assist with discharge planning needs.   Expected Discharge Plan: Skilled Nursing Facility Barriers to Discharge: Continued Medical Work up   Patient Goals and CMS Choice Patient states their goals for this hospitalization and ongoing recovery are:: SNF CMS Medicare.gov Compare Post Acute Care list provided to:: Patient Choice offered to / list presented to : Patient  Expected Discharge Plan and Services Expected Discharge Plan: [place] In-house Referral: Clinical Social Work     Living arrangements for the past 2 months: [place]                                       Prior Living Arrangements/Services Living arrangements for the past 2 months: [place] Lives with:: Facility Resident Patient language and need for interpreter reviewed:: Yes Do you feel safe going back to the place where you live?: No   SNF  Need for Family Participation in Patient Care: Yes (Comment) Care giver support system in place?: Yes (comment)   Criminal Activity/Legal Involvement Pertinent to Current Situation/Hospitalization: No - Comment as needed  Activities of Daily Living      Permission Sought/Granted Permission sought to share information with : Case Manager, Family Supports, Chartered certified accountant granted to share information with : Yes, Verbal Permission Granted  Share Information with NAME: [name]  Permission granted to share info w AGENCY: SNF  Permission granted to share info w Relationship: sister  Permission granted to share info w Contact Information: [name] [phone]  Emotional Assessment Appearance:: Appears stated age Attitude/Demeanor/Rapport: Gracious Affect (typically observed): Calm Orientation: : Oriented to Self, Oriented to Place, Oriented to  Time, Oriented to Situation Alcohol / Substance Use: Not Applicable Psych Involvement: No (comment)  Admission diagnosis:  Acute cystitis without hematuria [N30.00] Sepsis secondary to UTI ([place]) [A41.9, N39.0] Sepsis, due to unspecified organism, unspecified whether acute organ dysfunction present [facility]) [A41.9] Patient Active Problem List   Diagnosis Date Noted   SIRS (systemic inflammatory response syndrome) ([employer]) 02/11/2022   Bacteremia due to Proteus species    Acute renal failure superimposed on stage  3a chronic kidney disease ([place]) 06/29/2021   Bladder spasm 06/29/2021   Sepsis ([place]) 06/28/2021   Spasticity 06/28/2021   Prolonged QT interval 06/28/2021   Constipation 06/28/2021   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  03/08/2021   Generalized weakness    Acute CVA (cerebrovascular accident) ([place]) 11/19/2020   Allergic rhinitis due to allergen 04/27/2016   Chest pain 02/27/2016   Thoracic ascending aortic aneurysm ([place]) 02/27/2016   Leukocytosis 02/27/2016   Major depression, chronic 02/16/2016   Neuropathic pain 02/16/2016   D (diarrhea) 12/29/2015   Coronary artery disease involving native coronary artery of native heart with angina pectoris ([place]) 11/12/2015   History of CVA with residual deficit 10/14/2015   Hyperlipidemia 07/16/2015   Hypertensive heart disease with congestive heart failure ([place]) 12/10/2014   Angina pectoris ([place]) 12/10/2014   Chronic obstructive pulmonary disease ([place]) 12/10/2014   Irritable bowel syndrome with diarrhea 12/10/2014   Senile osteoporosis 12/10/2014   CVA (cerebral vascular accident) ([place]) 12/10/2014   Acute on chronic diastolic CHF (congestive heart failure) ([place]) 09/27/2014   Hypokalemia 09/23/2014   Pancreatitis, gallstone 09/22/2014   Chronic diastolic heart failure (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/18/2014   Left spastic hemiplegia ([place]) 06/18/2014   UTI (urinary tract infection) 04/04/2014   Essential hypertension 04/03/2014   Chronic airway obstruction, not elsewhere classified 01/15/2014   Dermatophytosis of nail 12/18/2013   Unspecified essential hypertension 12/18/2013   Generalized osteoarthrosis, unspecified site 12/18/2013   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   GERD (gastroesophageal reflux disease) 08/11/2013   Disuse osteoporosis 08/11/2013   Chronic pain  syndrome 01/30/2013   IBS (irritable bowel syndrome) 01/30/2013   Cerebral infarct ([place]) 10/24/2012   HTN (hypertension) 10/24/2012   Osteoporosis 10/24/2012   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   Other and unspecified hyperlipidemia 10/24/2012   Hiatal hernia 10/24/2012   PCP:  Virgel Bouquet, MD Pharmacy:  No Pharmacies Listed    Social Determinants of Health (SDOH) Interventions    Readmission Risk Interventions    10/13/2021   12:30 PM  Readmission Risk Prevention Plan  Transportation Screening Complete  Medication Review (RN Care Manager) Referral to Pharmacy  PCP or Specialist appointment within 3-5 days of discharge Not Complete  PCP/Specialist Appt Not Complete comments Still inpatient without discharge date at this time  [place] or [place] Not Complete  HRI or Home Care Consult Pt Refusal Comments Patient is LTC at [facility]  SW Recovery Care/Counseling Consult Complete  Palliative Care Screening Not Applicable  Skilled Nursing Facility Complete

## 2022-02-13 NOTE — NC FL2 (Signed)
[place] LEVEL OF CARE SCREENING TOOL     IDENTIFICATION  Patient Name: Gregory Kerr Birthdate: May 13, 1941 Sex: male Admission Date (Current Location): 02/11/2022  [facility] and [place] Number:  Herbalist and Address:  [facility] [place], [place] [address] [place], [place] [address]      Provider Number: [id number]  Attending Physician Name and Address:  Aline August, MD  Relative Name and Phone Number:       Current Level of Care: Hospital Recommended Level of Care: [place] Prior Approval Number:    Date Approved/Denied:   PASRR Number: [id number]  Discharge Plan: SNF    Current Diagnoses: Patient Active Problem List   Diagnosis Date Noted   SIRS (systemic inflammatory response syndrome) ([place]) 02/11/2022   Bacteremia due to Proteus species    Acute renal failure superimposed on stage 3a chronic kidney disease ([place]) 06/29/2021   Bladder spasm 06/29/2021   Sepsis ([place]) 06/28/2021   Spasticity 06/28/2021   Prolonged QT interval 06/28/2021   Constipation 06/28/2021   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 03/08/2021   Generalized weakness    Acute CVA (cerebrovascular accident) ([place]) 11/19/2020   Allergic rhinitis due to allergen 04/27/2016   Chest pain 02/27/2016   Thoracic ascending aortic aneurysm ([place]) 02/27/2016   Leukocytosis 02/27/2016   Major depression, chronic 02/16/2016   Neuropathic pain 02/16/2016   D (diarrhea) 12/29/2015   Coronary artery disease involving native coronary artery of native heart with angina pectoris ([place]) 11/12/2015   History of CVA with residual deficit 10/14/2015   Hyperlipidemia 07/16/2015   Hypertensive heart disease with congestive heart failure ([place]) 12/10/2014   Angina pectoris ([place]) 12/10/2014   Chronic obstructive pulmonary disease ([place]) 12/10/2014   Irritable bowel syndrome with diarrhea 12/10/2014   Senile osteoporosis 12/10/2014   CVA (cerebral vascular  accident) ([place]) 12/10/2014   Acute on chronic diastolic CHF (congestive heart failure) ([place]) 09/27/2014   Hypokalemia 09/23/2014   Pancreatitis, gallstone 09/22/2014   Chronic diastolic heart failure ([place]) 09/22/2014   Acute cholangitis 09/22/2014   Gallstone pancreatitis 09/22/2014   Hiatal hernia with GERD 08/31/2014   Vitamin D deficiency 08/03/2014   Hyperlipidemia LDL goal <100 08/03/2014   CVA, old, hemiparesis ([place]) 08/03/2014   Leg edema, left 08/03/2014   Gastroesophageal reflux disease without esophagitis 06/18/2014   Essential hypertension, benign 06/18/2014   Primary generalized hypertrophic osteoarthrosis 06/18/2014   Left spastic hemiplegia ([place]) 06/18/2014   UTI (urinary tract infection) 04/04/2014   Essential hypertension 04/03/2014   Chronic airway obstruction, not elsewhere classified 01/15/2014   Dermatophytosis of nail 12/18/2013   Unspecified essential hypertension 12/18/2013   Generalized osteoarthrosis, unspecified site 12/18/2013   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   GERD (gastroesophageal reflux disease) 08/11/2013   Disuse osteoporosis 08/11/2013   Chronic pain syndrome 01/30/2013   IBS (irritable bowel syndrome) 01/30/2013   Cerebral infarct ([place]) 10/24/2012   HTN (hypertension) 10/24/2012   Osteoporosis 10/24/2012   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   Other and unspecified hyperlipidemia 10/24/2012   Hiatal hernia 10/24/2012    Orientation RESPIRATION BLADDER Height & Weight     Self, Time, Situation, Place  Normal Incontinent Weight: 164 lb 14.5 oz (74.8 kg) Height:     BEHAVIORAL SYMPTOMS/MOOD NEUROLOGICAL BOWEL NUTRITION STATUS      Incontinent Diet (Please see discharge summary)  AMBULATORY STATUS COMMUNICATION OF NEEDS Skin   Extensive Assist Verbally Other (Comment) (Appropriate for ethnicity,Erythema/redness,groin,buttocks,bilateral,Wound/Incision LDAs)  Personal Care Assistance  Level of Assistance  Bathing, Feeding, Dressing Bathing Assistance: Maximum assistance Feeding assistance: Maximum assistance Dressing Assistance: Maximum assistance     Functional Limitations Info  Sight, Hearing, Speech Sight Info: Adequate (WDL) Hearing Info: Adequate Speech Info: Adequate    SPECIAL CARE FACTORS FREQUENCY  PT (By licensed PT), OT (By licensed OT)     PT Frequency: 5x min weekly OT Frequency: 5x min weekly            Contractures Contractures Info: Not present    Additional Factors Info  Code Status, Allergies, Psychotropic Code Status Info: FULL Allergies Info: Penicillins Psychotropic Info: sertraline (ZOLOFT) tablet 100 mg daily         Current Medications (02/13/2022):  This is the current hospital active medication list Current Facility-Administered Medications  Medication Dose Route Frequency Provider Last Rate Last Admin   acetaminophen (TYLENOL) tablet 650 mg  650 mg Oral Q6H PRN Karmen Bongo, MD   650 mg at 02/13/22 0219   Or   acetaminophen (TYLENOL) suppository 650 mg  650 mg Rectal Q6H PRN Karmen Bongo, MD       ampicillin (OMNIPEN) 1 g in sodium chloride 0.9 % 100 mL IVPB  1 g Intravenous Q6H Alekh, Kshitiz, MD 300 mL/hr at 02/13/22 1134 1 g at 02/13/22 1134   atorvastatin (LIPITOR) tablet 40 mg  40 mg Oral QPM Karmen Bongo, MD   40 mg at 02/12/22 1740   baclofen (LIORESAL) tablet 20 mg  20 mg Oral TID Karmen Bongo, MD   20 mg at 02/13/22 1546   clopidogrel (PLAVIX) tablet 75 mg  75 mg Oral QPM Karmen Bongo, MD   75 mg at 02/12/22 1739   Dextromethorphan-quiNIDine (NUEDEXTA) 20-10 MG per capsule 1 capsule  1 capsule Oral QPM Karmen Bongo, MD   1 capsule at 02/12/22 1752   enoxaparin (LOVENOX) injection 40 mg  40 mg Subcutaneous Q24H Karmen Bongo, MD   40 mg at 02/12/22 2100   fesoterodine (TOVIAZ) tablet 4 mg  4 mg Oral Daily Karmen Bongo, MD   4 mg at 02/13/22 0946   gabapentin (NEURONTIN) capsule 100 mg  100 mg  Oral TID Karmen Bongo, MD   100 mg at 02/13/22 1546   guaiFENesin (MUCINEX) 12 hr tablet 600 mg  600 mg Oral BID Karmen Bongo, MD   600 mg at 02/13/22 0946   hydrALAZINE (APRESOLINE) tablet 25 mg  25 mg Oral Q6H PRN Opyd, Ilene Qua, MD   25 mg at 02/12/22 2100   loratadine (CLARITIN) tablet 10 mg  10 mg Oral QPM Karmen Bongo, MD   10 mg at 02/12/22 1739   metoprolol tartrate (LOPRESSOR) injection 2.5 mg  2.5 mg Intravenous Q6H PRN Aline August, MD   2.5 mg at 02/12/22 2348   morphine (PF) 2 MG/ML injection 1 mg  1 mg Intravenous Q4H PRN Aline August, MD   1 mg at 02/12/22 1744   pantoprazole (PROTONIX) EC tablet 20 mg  20 mg Oral Daily Karmen Bongo, MD   20 mg at 02/13/22 0946   phenazopyridine (PYRIDIUM) tablet 100 mg  100 mg Oral Q8H PRN Karmen Bongo, MD   100 mg at 02/12/22 2300   polyethylene glycol (MIRALAX / GLYCOLAX) packet 17 g  17 g Oral Daily PRN Opyd, Ilene Qua, MD   17 g at 02/12/22 2057   polyvinyl alcohol (LIQUIFILM TEARS) 1.4 % ophthalmic solution 1 drop  1 drop Both Eyes TID Karmen Bongo, MD  1 drop at 02/13/22 1548   sertraline (ZOLOFT) tablet 100 mg  100 mg Oral Daily Karmen Bongo, MD   100 mg at 02/13/22 0946   sodium chloride flush (NS) 0.9 % injection 3 mL  3 mL Intravenous Q12H Karmen Bongo, MD   3 mL at 02/13/22 0948   tamsulosin (FLOMAX) capsule 0.4 mg  0.4 mg Oral Ivery Quale, MD   0.4 mg at 02/12/22 2100   traMADol (ULTRAM) tablet 50 mg  50 mg Oral Lillia [place], MD   50 mg at 02/13/22 0945   traMADol (ULTRAM) tablet 50 mg  50 mg Oral Daily PRN Karmen Bongo, MD   50 mg at 02/12/22 1451     Discharge Medications: Please see discharge summary for a list of discharge medications.  Relevant Imaging Results:  Relevant Lab Results:   Additional Information [id number]  Milas Gain, LCSWA

## 2022-02-14 DIAGNOSIS — R6510 Systemic inflammatory response syndrome (SIRS) of non-infectious origin without acute organ dysfunction: Secondary | ICD-10-CM | POA: Diagnosis not present

## 2022-02-14 DIAGNOSIS — F482 Pseudobulbar affect: Secondary | ICD-10-CM | POA: Diagnosis not present

## 2022-02-14 DIAGNOSIS — Z66 Do not resuscitate: Secondary | ICD-10-CM | POA: Diagnosis not present

## 2022-02-14 DIAGNOSIS — N390 Urinary tract infection, site not specified: Secondary | ICD-10-CM | POA: Diagnosis not present

## 2022-02-14 DIAGNOSIS — N3000 Acute cystitis without hematuria: Secondary | ICD-10-CM | POA: Diagnosis not present

## 2022-02-14 DIAGNOSIS — R531 Weakness: Secondary | ICD-10-CM | POA: Diagnosis not present

## 2022-02-14 DIAGNOSIS — I6930 Unspecified sequelae of cerebral infarction: Secondary | ICD-10-CM | POA: Diagnosis not present

## 2022-02-14 LAB — CBC WITH DIFFERENTIAL/PLATELET
Abs Immature Granulocytes: 0.01 10*3/uL (ref 0.00–0.07)
Basophils Absolute: 0 10*3/uL (ref 0.0–0.1)
Basophils Relative: 0 %
Eosinophils Absolute: 0.3 10*3/uL (ref 0.0–0.5)
Eosinophils Relative: 4 %
HCT: 37.2 % — ABNORMAL LOW (ref 39.0–52.0)
Hemoglobin: 12.3 g/dL — ABNORMAL LOW (ref 13.0–17.0)
Immature Granulocytes: 0 %
Lymphocytes Relative: 22 %
Lymphs Abs: 1.4 10*3/uL (ref 0.7–4.0)
MCH: 28.9 pg (ref 26.0–34.0)
MCHC: 33.1 g/dL (ref 30.0–36.0)
MCV: 87.3 fL (ref 80.0–100.0)
Monocytes Absolute: 0.6 10*3/uL (ref 0.1–1.0)
Monocytes Relative: 10 %
Neutro Abs: 4.1 10*3/uL (ref 1.7–7.7)
Neutrophils Relative %: 64 %
Platelets: 135 10*3/uL — ABNORMAL LOW (ref 150–400)
RBC: 4.26 MIL/uL (ref 4.22–5.81)
RDW: 13.8 % (ref 11.5–15.5)
WBC: 6.4 10*3/uL (ref 4.0–10.5)
nRBC: 0 % (ref 0.0–0.2)

## 2022-02-14 LAB — BASIC METABOLIC PANEL
Anion gap: 5 (ref 5–15)
BUN: 19 mg/dL (ref 8–23)
CO2: 26 mmol/L (ref 22–32)
Calcium: 8.6 mg/dL — ABNORMAL LOW (ref 8.9–10.3)
Chloride: 109 mmol/L (ref 98–111)
Creatinine, Ser: 0.78 mg/dL (ref 0.61–1.24)
GFR, Estimated: >60 mL/min (ref >60)
Glucose, Bld: 103 mg/dL — ABNORMAL HIGH (ref 70–99)
Potassium: 3.5 mmol/L (ref 3.5–5.1)
Sodium: 140 mmol/L (ref 135–145)

## 2022-02-14 LAB — MAGNESIUM: Magnesium: 1.6 mg/dL — ABNORMAL LOW (ref 1.7–2.4)

## 2022-02-14 NOTE — TOC Progression Note (Signed)
Transition of Care [facility]) - Progression Note    Patient Details  Name: RANEN DOOLIN MRN: [id number] Date of Birth: 1940-12-05  Transition of Care [facility]) CM/SW [place], [place] Phone Number: 02/14/2022, 3:46 PM  Clinical Narrative:     Patient has SNF bed at [facility] place with palliative services to follow when medically ready for dc. CSW will continue to follow and assist with patients dc planning needs.  Expected Discharge Plan: [place] Barriers to Discharge: Continued Medical Work up  Expected Discharge Plan and Services Expected Discharge Plan: [place] In-house Referral: Clinical Social Work     Living arrangements for the past 2 months: [place]                                       Social Determinants of Health (SDOH) Interventions    Readmission Risk Interventions    10/13/2021   12:30 PM  Readmission Risk Prevention Plan  Transportation Screening Complete  Medication Review Press photographer) Referral to Pharmacy  PCP or Specialist appointment within 3-5 days of discharge Not Complete  PCP/Specialist Appt Not Complete comments Still inpatient without discharge date at this time  [place] or [place] Not Complete  HRI or Home Care Consult Pt Refusal Comments Patient is LTC at [facility]  SW Recovery Care/Counseling Consult Complete  [place] Complete

## 2022-02-14 NOTE — Progress Notes (Addendum)
Per order, I and O Cath performed for Bladder Scan >300. Procedure observed by second RN, Sallee Lange. Of note, pt endorsed that bladder spasms, which were severe prior to cath, subsided completely post drainage of bladder.  Addendum: While bathing pt, pt was observed to have very mild bleeding from his urethra. Will monitor and convey to Day Team.

## 2022-02-14 NOTE — Care Management Important Message (Signed)
Important Message  Patient Details  Name: Gregory Kerr MRN: [id number] Date of Birth: 10/10/1940   Medicare Important Message Given:  Yes     Shelda Altes 02/14/2022, 11:12 AM

## 2022-02-14 NOTE — Progress Notes (Signed)
Patient ID: Gregory LEHENBAUER, male   DOB: August 13, 1940, 81 y.o.   MRN: [id number]    Progress Note from the Palliative Medicine Team at [facility]   Patient Name: Gregory Kerr        Date: 02/14/2022 DOB: [birth year]  Age: 81 y.o. MRN#: [id number] Attending Physician: Aline August, MD Primary Care Physician: Virgel Bouquet, MD Admit Date: 02/11/2022   Medical records reviewed   82 y.o. male   admitted on 02/11/2022 with medical history significant of HTN, HLD, and prior CVA with residual L hemiplegia presented from SNF with generalized weakness.  On presentation, MRI of brain was negative for acute stroke.  He was tachycardic, febrile with possible UTI.  He was started on IV fluids and antibiotics.   Patient is a long-term care resident at [facility].    Patient and family face treatment option decisions, advance directive decisions and anticipatory care needs.   I spoke to patient's sister/ [name] [name] by telephone today  to discuss the above concepts.    Created space and opportunity for patient  and family to explore thoughts and feelings regarding current medical situation.  Both patient and his sister verbalized an understanding of the patient's multiple comorbidities and ongoing physical and functional decline over time.  Patient's sister herself lives it in assisted living.  It is very difficult for her to visit with her brother and she has not seen him in quite some time.  I shared with [name] patient's decision yesterday regarding limits around level of care specific to CODE STATUS and artificial feeding.  She supports patient's decisions.  Education offered on the concept of adult failure to thrive and the limitations of medical interventions to prolong quality of life when the body does fail to thrive.  Patient is demonstrating slow overall failure to thrive secondary to multiple comorbidities.  Plan of care -DNR/DNI-documented today -No artificial feeding now or in  the future -Transition back to skilled nursing facility for rehab -Recommend outpatient palliative care services at facility -MOST form completed and placed in hard chart   Education offered today regarding  the importance of continued conversation with family and the  medical providers regarding overall plan of care and treatment options,  ensuring decisions are within the context of the patients values and GOCs.  Questions and concerns addressed   Discussed with Dr Kennith Center NP  Palliative Medicine Team Team Phone # [phone] Pager [phone]

## 2022-02-14 NOTE — Progress Notes (Addendum)
PROGRESS NOTE    Gregory Kerr  XHB:[id number] DOB: Jun 10, 1940 DOA: 02/11/2022 PCP: Virgel Bouquet, MD   Brief Narrative:  81 y.o. male with medical history significant of HTN, HLD, and prior CVA with residual L hemiplegia presented from SNF with generalized weakness.  On presentation, MRI of brain was negative for acute stroke.  He was tachycardic, febrile with possible UTI.  He was started on IV fluids and antibiotics.  Assessment & Plan:   UTI: Present on admission SIRS -Sepsis ruled out -Presented with fever, tachycardia, tachypnea with possible UTI. -Blood cultures negative so far.  Urine cultures growing Enterococcus faecalis, sensitivities still pending.  Continue IV ampicillin.    Generalized weakness -MRI of brain was negative for acute stroke.  PT recommends return back to SNF.  Social worker following.  History of CVA, unspecified with residual left hemiplegia -Patient is nonambulatory. -Continue Plavix, baclofen, tramadol, gabapentin, sertraline -Palliative care consulted for goals of care discussion  Hypertension -Blood pressure currently stable and currently not on any antihypertensives.  Monitor.   Hyperlipidemia -Continue statin  Pseudobulbar affect -Continue Nuedexta   DVT prophylaxis: Lovenox  code Status: Full Family Communication: None at bedside Disposition Plan: Status is: Inpatient Remains inpatient appropriate because: Of severity of illness.  Possible discharge back to SNF in 24 to 48 hours if condition remains stable.    Consultants: Palliative care  Procedures: None  Antimicrobials:  Anti-infectives (From admission, onward)    Start     Dose/Rate Route Frequency Ordered Stop   02/12/22 1300  ampicillin (OMNIPEN) 1 g in sodium chloride 0.9 % 100 mL IVPB        1 g 300 mL/hr over 20 Minutes Intravenous Every 6 hours 02/12/22 1139     02/12/22 0930  cefTRIAXone (ROCEPHIN) 1 g in sodium chloride 0.9 % 100 mL IVPB  Status:  Discontinued         1 g 200 mL/hr over 30 Minutes Intravenous Every 24 hours 02/11/22 1213 02/12/22 1139   02/11/22 0930  cefTRIAXone (ROCEPHIN) 1 g in sodium chloride 0.9 % 100 mL IVPB        1 g 200 mL/hr over 30 Minutes Intravenous  Once 02/11/22 8101 02/11/22 1012       Subjective: Patient seen and examined at bedside.  Extremely poor historian; still very slow to respond.  No fever, vomiting, agitation or seizures reported.   Objective: Vitals:   02/13/22 1300 02/13/22 1627 02/13/22 1939 02/14/22 0300  BP: 129/74 (!) 152/85 139/80 134/64  Pulse: 83 90 90 69  Resp: '17 16 17 16  '$ Temp: 97.9 F (36.6 C) 98 F (36.7 C) 98.3 F (36.8 C) 98.1 F (36.7 C)  TempSrc: Oral Oral Oral Oral  SpO2: 92% 93% 92% 92%  Weight:    74.1 kg    Intake/Output Summary (Last 24 hours) at 02/14/2022 0830 Last data filed at 02/14/2022 0534 Gross per 24 hour  Intake 900 ml  Output 1002 ml  Net -102 ml    Filed Weights   02/13/22 0613 02/14/22 0300  Weight: 74.8 kg 74.1 kg    Examination:  General: No acute distress.  On room air currently.  Looks chronically ill and deconditioned. ENT/neck: No obvious JVD elevation or palpable neck masses noted respiratory: Bilateral decreased breath sounds at bases with scattered crackles  CVS: Currently rate controlled; S1 and S2 are heard abdominal: Soft, nontender, still slightly distended; no organomegaly, bowel sounds are heard Extremities: No; mild lower extremity edema present CNS:  Alert, still slow to respond; extremely poor historian.   Left hemiplegia with left upper extremity and lower extremity contractures  Lymph: No palpable lymphadenopathy noted Skin: No obvious ecchymosis/rashes  psych: Affect is extremely flat.  No signs of agitation currently. Musculoskeletal: No obvious other joint swelling/deformity   Data Reviewed: I have personally reviewed following labs and imaging studies  CBC: Recent Labs  Lab 02/11/22 0207 02/11/22 0223 02/12/22 0420  02/13/22 0219 02/14/22 0200  WBC 6.1  --  6.8 7.5 6.4  NEUTROABS 3.7  --   --  5.7 4.1  HGB 15.1 14.3 12.6* 14.8 12.3*  HCT 46.6 42.0 37.2* 44.3 37.2*  MCV 88.3  --  86.3 87.0 87.3  PLT 178  --  123* 150 135*    Basic Metabolic Panel: Recent Labs  Lab 02/11/22 0207 02/11/22 0223 02/12/22 0420 02/13/22 0219 02/14/22 0200  NA 138 139 139 141 140  K 4.2 7.1* 3.7 3.8 3.5  CL 109 108 110 109 109  CO2 25  --  '22 22 26  '$ GLUCOSE 101* 96 83 131* 103*  BUN 18 28* '17 16 19  '$ CREATININE 0.83 0.70 0.85 0.94 0.78  CALCIUM 9.3  --  8.5* 9.4 8.6*  MG  --   --   --  1.8 1.6*    GFR: Estimated Creatinine Clearance: 76.1 mL/min (by C-G formula based on SCr of 0.78 mg/dL). Liver Function Tests: Recent Labs  Lab 02/11/22 0207  AST 32  ALT 30  ALKPHOS 98  BILITOT 0.8  PROT 7.0  ALBUMIN 3.3*    No results for input(s): "LIPASE", "AMYLASE" in the last 168 hours. No results for input(s): "AMMONIA" in the last 168 hours. Coagulation Profile: Recent Labs  Lab 02/11/22 0207  INR 1.0    Cardiac Enzymes: No results for input(s): "CKTOTAL", "CKMB", "CKMBINDEX", "TROPONINI" in the last 168 hours. BNP (last 3 results) No results for input(s): "PROBNP" in the last 8760 hours. HbA1C: No results for input(s): "HGBA1C" in the last 72 hours. CBG: No results for input(s): "GLUCAP" in the last 168 hours. Lipid Profile: No results for input(s): "CHOL", "HDL", "LDLCALC", "TRIG", "CHOLHDL", "LDLDIRECT" in the last 72 hours. Thyroid Function Tests: No results for input(s): "TSH", "T4TOTAL", "FREET4", "T3FREE", "THYROIDAB" in the last 72 hours. Anemia Panel: No results for input(s): "VITAMINB12", "FOLATE", "FERRITIN", "TIBC", "IRON", "RETICCTPCT" in the last 72 hours. Sepsis Labs: Recent Labs  Lab 02/11/22 1052 02/11/22 1304 02/11/22 1308  PROCALCITON  --   --  2.15  LATICACIDVEN 1.4 1.2  --      Recent Results (from the past 240 hour(s))  Resp Panel by RT-PCR (Flu A&B, Covid)  Anterior Nasal Swab     Status: None   Collection Time: 02/11/22  2:30 AM   Specimen: Anterior Nasal Swab  Result Value Ref Range Status   SARS Coronavirus 2 by RT PCR NEGATIVE NEGATIVE Final    Comment: (NOTE) SARS-CoV-2 target nucleic acids are NOT DETECTED.  The SARS-CoV-2 RNA is generally detectable in upper respiratory specimens during the acute phase of infection. The lowest concentration of SARS-CoV-2 viral copies this assay can detect is 138 copies/mL. A negative result does not preclude SARS-Cov-2 infection and should not be used as the sole basis for treatment or other patient management decisions. A negative result may occur with  improper specimen collection/handling, submission of specimen other than nasopharyngeal swab, presence of viral mutation(s) within the areas targeted by this assay, and inadequate number of viral copies(<138 copies/mL). A negative  result must be combined with clinical observations, patient history, and epidemiological information. The expected result is Negative.  Fact Sheet for Patients:  EntrepreneurPulse.com.au  Fact Sheet for Healthcare Providers:  IncredibleEmployment.be  This test is no t yet approved or cleared by the [place] FDA and  has been authorized for detection and/or diagnosis of SARS-CoV-2 by FDA under an Emergency Use Authorization (EUA). This EUA will remain  in effect (meaning this test can be used) for the duration of the COVID-19 declaration under Section 564(b)(1) of the Act, 21 U.S.C.section 360bbb-3(b)(1), unless the authorization is terminated  or revoked sooner.       Influenza A by PCR NEGATIVE NEGATIVE Final   Influenza B by PCR NEGATIVE NEGATIVE Final    Comment: (NOTE) The Xpert Xpress SARS-CoV-2/FLU/RSV plus assay is intended as an aid in the diagnosis of influenza from Nasopharyngeal swab specimens and should not be used as a sole basis for treatment. Nasal washings  and aspirates are unacceptable for Xpert Xpress SARS-CoV-2/FLU/RSV testing.  Fact Sheet for Patients: EntrepreneurPulse.com.au  Fact Sheet for Healthcare Providers: IncredibleEmployment.be  This test is not yet approved or cleared by the [place] FDA and has been authorized for detection and/or diagnosis of SARS-CoV-2 by FDA under an Emergency Use Authorization (EUA). This EUA will remain in effect (meaning this test can be used) for the duration of the COVID-19 declaration under Section 564(b)(1) of the Act, 21 U.S.C. section 360bbb-3(b)(1), unless the authorization is terminated or revoked.  Performed at [facility], [place] [address], [place], [place] [address]   Urine Culture     Status: Abnormal (Preliminary result)   Collection Time: 02/11/22 10:20 AM   Specimen: Urine, Clean Catch  Result Value Ref Range Status   Specimen Description URINE, CLEAN CATCH  Final   Special Requests NONE  Final   Culture (A)  Final    >=100,000 COLONIES/mL ENTEROCOCCUS FAECALIS CULTURE REINCUBATED FOR BETTER GROWTH Performed at [facility], [place] [address] [place], [place] [address]    Report Status PENDING  Incomplete  Culture, blood (routine x 2)     Status: None (Preliminary result)   Collection Time: 02/11/22 10:52 AM   Specimen: BLOOD  Result Value Ref Range Status   Specimen Description BLOOD SITE NOT SPECIFIED  Final   Special Requests   Final    BOTTLES DRAWN AEROBIC AND ANAEROBIC Blood Culture adequate volume   Culture   Final    NO GROWTH 3 DAYS Performed at [facility], [address] [place], [place] [address]    Report Status PENDING  Incomplete  Culture, blood (routine x 2)     Status: None (Preliminary result)   Collection Time: 02/11/22 10:53 AM   Specimen: BLOOD LEFT WRIST  Result Value Ref Range Status   Specimen Description BLOOD LEFT WRIST  Final   Special Requests   Final    BOTTLES DRAWN AEROBIC AND  ANAEROBIC Blood Culture adequate volume   Culture   Final    NO GROWTH 3 DAYS Performed at [facility], [place] [address] [place], [place] [address]    Report Status PENDING  Incomplete         Radiology Studies: DG CHEST PORT 1 VIEW  Result Date: 02/12/2022 CLINICAL DATA:  [id number]. pt order states Acute respiratory failure with hypoxia Best obtainable image. Pt left arm/hand contracted near abdomen/ lower portion of chest at time of imaging. EXAM: PORTABLE CHEST 1 VIEW.  Patient  is rotated. COMPARISON:  Chest x-ray 10/15/2021, CT abdomen pelvis 08/31/2021 FINDINGS: Limited evaluation due to patient rotation. The heart and mediastinal contours are unchanged. Aortic calcification. Elevated left hemidiaphragm with associated large hiatal hernia. Left base passive atelectasis. Likely paramediastinal right upper lobe atelectasis. No focal consolidation. No pulmonary edema. No pleural effusion. No pneumothorax. No acute osseous abnormality. IMPRESSION: 1. No active disease with limited evaluation of the cardiomediastinal silhouette and right upper lobe due to patient rotation and likely atelectasis. Recommend repeat PA and lateral view of the chest for further evaluation. 2. Large hiatal hernia. Electronically Signed   By: Iven Finn M.D.   On: 02/12/2022 21:41        Scheduled Meds:  atorvastatin  40 mg Oral QPM   baclofen  20 mg Oral TID   clopidogrel  75 mg Oral QPM   Dextromethorphan-quiNIDine  1 capsule Oral QPM   enoxaparin (LOVENOX) injection  40 mg Subcutaneous Q24H   fesoterodine  4 mg Oral Daily   gabapentin  100 mg Oral TID   guaiFENesin  600 mg Oral BID   loratadine  10 mg Oral QPM   pantoprazole  20 mg Oral Daily   polyvinyl alcohol  1 drop Both Eyes TID   sertraline  100 mg Oral Daily   sodium chloride flush  3 mL Intravenous Q12H   tamsulosin  0.4 mg Oral QHS   traMADol  50 mg Oral Q12H   Continuous Infusions:  ampicillin (OMNIPEN) IV 1 g (02/14/22 0538)           Aline August, MD [facility] 02/14/2022, 8:30 AM

## 2022-02-15 DIAGNOSIS — R6510 Systemic inflammatory response syndrome (SIRS) of non-infectious origin without acute organ dysfunction: Secondary | ICD-10-CM | POA: Diagnosis not present

## 2022-02-15 LAB — CBC WITH DIFFERENTIAL/PLATELET
Abs Immature Granulocytes: 0.03 10*3/uL (ref 0.00–0.07)
Basophils Absolute: 0 10*3/uL (ref 0.0–0.1)
Basophils Relative: 0 %
Eosinophils Absolute: 0.1 10*3/uL (ref 0.0–0.5)
Eosinophils Relative: 2 %
HCT: 38.3 % — ABNORMAL LOW (ref 39.0–52.0)
Hemoglobin: 12.5 g/dL — ABNORMAL LOW (ref 13.0–17.0)
Immature Granulocytes: 0 %
Lymphocytes Relative: 17 %
Lymphs Abs: 1.3 10*3/uL (ref 0.7–4.0)
MCH: 28.2 pg (ref 26.0–34.0)
MCHC: 32.6 g/dL (ref 30.0–36.0)
MCV: 86.5 fL (ref 80.0–100.0)
Monocytes Absolute: 0.6 10*3/uL (ref 0.1–1.0)
Monocytes Relative: 8 %
Neutro Abs: 5.5 10*3/uL (ref 1.7–7.7)
Neutrophils Relative %: 73 %
Platelets: 142 10*3/uL — ABNORMAL LOW (ref 150–400)
RBC: 4.43 MIL/uL (ref 4.22–5.81)
RDW: 13.5 % (ref 11.5–15.5)
WBC: 7.5 10*3/uL (ref 4.0–10.5)
nRBC: 0 % (ref 0.0–0.2)

## 2022-02-15 LAB — BASIC METABOLIC PANEL
Anion gap: 8 (ref 5–15)
BUN: 18 mg/dL (ref 8–23)
CO2: 25 mmol/L (ref 22–32)
Calcium: 8.8 mg/dL — ABNORMAL LOW (ref 8.9–10.3)
Chloride: 109 mmol/L (ref 98–111)
Creatinine, Ser: 0.77 mg/dL (ref 0.61–1.24)
GFR, Estimated: >60 mL/min (ref >60)
Glucose, Bld: 114 mg/dL — ABNORMAL HIGH (ref 70–99)
Potassium: 3.8 mmol/L (ref 3.5–5.1)
Sodium: 142 mmol/L (ref 135–145)

## 2022-02-15 LAB — MAGNESIUM: Magnesium: 1.6 mg/dL — ABNORMAL LOW (ref 1.7–2.4)

## 2022-02-15 MED ORDER — MAGNESIUM SULFATE 2 GM/50ML IV SOLN
2.0000 g | Freq: Once | INTRAVENOUS | Status: AC
  Administered 2022-02-15: 2 g via INTRAVENOUS
  Filled 2022-02-15: qty 50

## 2022-02-15 MED ORDER — TRAMADOL HCL 50 MG PO TABS: 50.0000 mg | ORAL_TABLET | ORAL | 0 refills | Status: DC

## 2022-02-15 MED ORDER — ORAL CARE MOUTH RINSE: 15.0000 mL | OROMUCOSAL | Status: DC | PRN

## 2022-02-15 MED ORDER — AMOXICILLIN-POT CLAVULANATE 875-125 MG PO TABS: 1.0000 | ORAL_TABLET | Freq: Two times a day (BID) | ORAL | 0 refills | Status: AC

## 2022-02-15 MED ORDER — ORAL CARE MOUTH RINSE
15.0000 mL | OROMUCOSAL | Status: DC
  Administered 2022-02-15: 15 mL via OROMUCOSAL

## 2022-02-15 NOTE — TOC Transition Note (Signed)
Transition of Care [facility]) - CM/SW Discharge Note   Patient Details  Name: Gregory Kerr MRN: [id number] Date of Birth: Dec 28, 1940  Transition of Care [facility]) CM/SW Contact:  Milas Gain, [place] Phone Number: 02/15/2022, 12:04 PM   Clinical Narrative:     Patient will DC to: [facility]  Anticipated DC date: 02/15/2022  Family notified: [name]   Transport by: Corey Harold  ?  Per MD patient ready for DC to [facility] with palliative services to follow . RN, patient, patient's family, Fabio Pierce with [facility],and facility notified of DC. Discharge Summary sent to facility. RN given number for report tele# [phone] RM# [phone]. DC packet on chart. DNR signed by MD attached to patients DC packet.Ambulance transport requested for patient.  CSW signing off.   Final next level of care: Skilled Nursing Facility Barriers to Discharge: No Barriers Identified   Patient Goals and CMS Choice Patient states their goals for this hospitalization and ongoing recovery are:: SNF CMS Medicare.gov Compare Post Acute Care list provided to:: Patient Choice offered to / list presented to : Patient  Discharge Placement              Patient chooses bed at: [facility] Patient to be transferred to facility by: [facility] [place] Name of family member notified: [name] Patient and family notified of of transfer: 02/15/22  Discharge Plan and Services In-house Referral: Clinical Social Work                                   Social Determinants of Health ([place]) Interventions     Readmission Risk Interventions    10/13/2021   12:30 PM  Readmission Risk Prevention Plan  Transportation Screening Complete  Medication Review [employer]) Referral to Pharmacy  PCP or Specialist appointment within 3-5 days of discharge Not Complete  PCP/Specialist Appt Not Complete comments Still inpatient without discharge date at this time  [place] or [place] Not Complete  HRI or Home Care Consult Pt  Refusal Comments Patient is LTC at [facility]  SW Recovery Care/Counseling Consult Complete  [place] Complete

## 2022-02-15 NOTE — Discharge Summary (Addendum)
Physician Discharge Summary  Gregory Kerr:[id number] DOB: 1940-07-20 DOA: 02/11/2022  PCP: Virgel Bouquet, MD  Admit date: 02/11/2022 Discharge date: 02/15/2022  Admitted From: SNF Disposition:  SNF  Recommendations for Outpatient Follow-up:  Follow up with PCP in 1-2 weeks Continue Augmentin for 7 additional days Continue palliative care follow-up as an outpatient  Home Health: none Equipment/Devices: none  Discharge Condition: stable CODE STATUS: Full code Diet Orders (From admission, onward)     Start     Ordered   02/11/22 1709  Diet Heart Room service appropriate? No; Fluid consistency: Thin  Diet effective now       Question Answer Comment  Room service appropriate? No   Fluid consistency: Thin      02/11/22 1708            HPI: Per admitting MD, Gregory Kerr is a 81 y.o. male with medical history significant of HTN, HLD, and prior CVA with residual L hemiplegia presenting with generalized weakness.  He provides a vague history, reports not feeling well.  Denies urinary symptoms, just felt weak all over.  No fever.  The facility told her that he was having problems with eating his dinner and was exhibiting further stroke signs.  He has had "so many" UTIs.    Hospital Course / Discharge diagnoses: Principal Problem:   SIRS (systemic inflammatory response syndrome) (HCC) Active Problems:   HTN (hypertension)   Pseudobulbar affect   UTI (urinary tract infection)   Left spastic hemiplegia (HCC)   Hyperlipidemia LDL goal <100   History of CVA with residual deficit   Generalized weakness   Prolonged QT interval   Principal problem UTI-patient presented to the hospital with fever, possible UTI.  He was placed initially on broad-spectrum antibiotics, and urine cultures were sent.  He eventually speciated Enterococcus which is pansensitive.  He was narrowed to ampicillin which she is tolerating well.  He received 3 days of antibiotics in the hospital,  clinically improved, afebrile, will be transition to Augmentin for 7 additional days to complete a 00-TMA course for complicated UTI.  Active problems Generalized weakness -MRI of brain was negative for acute stroke.  PT recommends return back to SNF.   History of CVA, unspecified with residual left hemiplegia -Patient is nonambulatory.  Continue home regimen Hypertension -Blood pressure currently stable and currently not on any antihypertensives.  Hyperlipidemia -Continue statin Pseudobulbar affect -Continue Nuedexta  Sepsis ruled out   Discharge Instructions   Allergies as of 02/15/2022       Reactions   Penicillins Rash   ++++Patient in nursing home. Unable to clarify details/severity of the reaction.++++        Medication List     TAKE these medications    amoxicillin-clavulanate 875-125 MG tablet Commonly known as: AUGMENTIN Take 1 tablet by mouth 2 (two) times daily for 7 days.   ARTIFICIAL TEAR OP Place 1 drop into both eyes 3 (three) times daily.   atorvastatin 40 MG tablet Commonly known as: LIPITOR Take 1 tablet (40 mg total) by mouth daily. What changed: when to take this   baclofen 20 MG tablet Commonly known as: LIORESAL Take 20 mg by mouth 3 (three) times daily.   bisacodyl 10 MG suppository Commonly known as: DULCOLAX Place 10 mg rectally daily as needed for moderate constipation.   calcium-vitamin D 500-5 MG-MCG tablet Commonly known as: OSCAL WITH D Take 1 tablet by mouth 2 (two) times daily.   clopidogrel 75 MG tablet  Commonly known as: PLAVIX Take 1 tablet (75 mg total) by mouth daily.   clotrimazole-betamethasone cream Commonly known as: LOTRISONE Apply 1 Application topically 2 (two) times daily. Thin layer, topical, twice a day **Apply to head of penis and foreskin x 2 weeks**   Dextromethorphan-quiNIDine 20-10 MG capsule Commonly known as: NUEDEXTA Take 1 capsule by mouth every evening. For Pseudobulbar Affect (PBA)   gabapentin  100 MG capsule Commonly known as: NEURONTIN Take 100 mg by mouth 3 (three) times daily.   guaiFENesin 600 MG 12 hr tablet Commonly known as: MUCINEX Take 600 mg by mouth 2 (two) times daily.   loperamide 2 MG tablet Commonly known as: IMODIUM A-D Take 4 mg by mouth 4 (four) times daily as needed for diarrhea or loose stools.   loratadine 10 MG tablet Commonly known as: CLARITIN Take 10 mg by mouth every evening.   nitroGLYCERIN 0.4 MG SL tablet Commonly known as: NITROSTAT Place 0.4 mg under the tongue every 5 (five) minutes as needed for chest pain.   nystatin powder Commonly known as: MYCOSTATIN/NYSTOP Apply 1 application  topically 3 (three) times daily. 1 application TID to suprapubic area, bilateral groin and penis   pantoprazole 20 MG tablet Commonly known as: PROTONIX Take 20 mg by mouth daily.   phenazopyridine 100 MG tablet Commonly known as: PYRIDIUM Take 100 mg by mouth every 8 (eight) hours as needed (bladder spasms).   polyethylene glycol 17 g packet Commonly known as: MIRALAX / GLYCOLAX Take 17 g by mouth daily as needed (constipation).   PrePLUS 27-1 MG Tabs Take 1 tablet by mouth daily.   Prolia 60 MG/ML Sosy injection Generic drug: denosumab Inject 60 mg into the skin every 6 (six) months.   saccharomyces boulardii 250 MG capsule Commonly known as: FLORASTOR Take 250 mg by mouth every 12 (twelve) hours.   sertraline 100 MG tablet Commonly known as: ZOLOFT Take 100 mg by mouth daily.   solifenacin 5 MG tablet Commonly known as: VESICARE Take 1 tablet (5 mg total) by mouth daily.   tamsulosin 0.4 MG Caps capsule Commonly known as: FLOMAX Take 1 capsule (0.4 mg total) by mouth at bedtime.   traMADol 50 MG tablet Commonly known as: ULTRAM Take 50 mg by mouth See admin instructions. Take 50 mg twice daily scheduled, may take a 3rd 50 mg dose as needed for pain   trimethoprim 100 MG tablet Commonly known as: TRIMPEX Take 100 mg by mouth  daily. **No stop date** per [facility]         Consultations: none  Procedures/Studies:  DG CHEST PORT 1 VIEW  Result Date: 02/12/2022 CLINICAL DATA:  [id number]. pt order states Acute respiratory failure with hypoxia Best obtainable image. Pt left arm/hand contracted near abdomen/ lower portion of chest at time of imaging. EXAM: PORTABLE CHEST 1 VIEW.  Patient is rotated. COMPARISON:  Chest x-ray 10/15/2021, CT abdomen pelvis 08/31/2021 FINDINGS: Limited evaluation due to patient rotation. The heart and mediastinal contours are unchanged. Aortic calcification. Elevated left hemidiaphragm with associated large hiatal hernia. Left base passive atelectasis. Likely paramediastinal right upper lobe atelectasis. No focal consolidation. No pulmonary edema. No pleural effusion. No pneumothorax. No acute osseous abnormality. IMPRESSION: 1. No active disease with limited evaluation of the cardiomediastinal silhouette and right upper lobe due to patient rotation and likely atelectasis. Recommend repeat PA and lateral view of the chest for further evaluation. 2. Large hiatal hernia. Electronically Signed   By: Iven Finn M.D.   On: 02/12/2022  21:41   CT Renal Stone Study  Result Date: 02/11/2022 CLINICAL DATA:  Flank pain.  Nephrolithiasis. EXAM: CT ABDOMEN AND PELVIS WITHOUT CONTRAST TECHNIQUE: Multidetector CT imaging of the abdomen and pelvis was performed following the standard protocol without IV contrast. RADIATION DOSE REDUCTION: This exam was performed according to the departmental dose-optimization program which includes automated exposure control, adjustment of the mA and/or kV according to patient size and/or use of iterative reconstruction technique. COMPARISON:  None Available. FINDINGS: Lower chest: No acute findings. Hepatobiliary: No mass visualized on this unenhanced exam. Prior cholecystectomy. No evidence of biliary obstruction. Pancreas: No mass or inflammatory process visualized on this  unenhanced exam. Spleen:  Within normal limits in size. Adrenals/Urinary tract: Renal vascular calcification noted bilaterally. Probable 3 mm nonobstructing calculus noted in upper pole of right kidney. No evidence of ureteral calculi or hydronephrosis. Diffuse bladder wall thickening is again demonstrated. Several bladder calculi are again seen, largest measuring 8 mm. Stomach/Bowel: Large hiatal hernia is again seen containing the entire stomach. No evidence of obstruction, inflammatory process, or abnormal fluid collections. A moderate right inguinal hernia is seen containing several small bowel loops, however there is no evidence of bowel obstruction or strangulation. Small left inguinal hernia is again seen which contains only fat. Large amount of stool is seen, particularly in the rectum, similar to prior exam. Vascular/Lymphatic: No pathologically enlarged lymph nodes identified. No evidence of abdominal aortic aneurysm. Aortic atherosclerotic calcification incidentally noted. Reproductive:  No mass or other significant abnormality. Other:  None. Musculoskeletal:  No suspicious bone lesions identified. IMPRESSION: Probable 3 mm nonobstructing right renal calculus. No evidence of ureteral calculi, hydronephrosis, or other acute findings. 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 Several small bladder calculi again noted. Stable large hiatal hernia. Moderate right inguinal hernia containing several small bowel loops. No evidence of bowel obstruction or strangulation. Stable small left inguinal hernia, which contains only fat. Electronically Signed   By: Marlaine Hind M.D.   On: 02/11/2022 11:05   MR BRAIN WO CONTRAST  Result Date: 02/11/2022 CLINICAL DATA:  81 year old male right side arm and leg weakness. EXAM: MRI HEAD WITHOUT CONTRAST TECHNIQUE: Multiplanar, multiecho pulse sequences of the brain and surrounding structures were obtained without intravenous contrast.  COMPARISON:  Head CT 0250 hours today.  Brain MRI 11/18/2020. FINDINGS: Brain: No restricted diffusion or evidence of acute infarction. Dysplastic appearance of the corpus callosum with superimposed or associated chronic small vessel disease redemonstrated. Chronic lacunar-type infarcts and small vessel ischemia in the bilateral cerebral white matter, bilateral deep gray nuclei, left brainstem. Stable cerebral volume. Stable chronic microhemorrhage in the right parietal lobe. No midline shift, mass effect, evidence of mass lesion, ventriculomegaly, extra-axial collection or acute intracranial hemorrhage. Cervicomedullary junction and pituitary are within normal limits. Vascular: Major intracranial vascular flow voids are stable. Generalized intracranial artery tortuosity. Skull and upper cervical spine: Stable. Visualized bone marrow signal is within normal limits. Sinuses/Orbits: Postoperative changes to both globes. Stable left maxillary sinus retention cyst. Other: Visible internal auditory structures appear normal. Mastoids remain clear. Negative visible scalp and face. IMPRESSION: 1. No acute intracranial abnormality. 2. Advanced chronic small vessel disease, including in the left brainstem. 3. 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 Electronically Signed   By: Genevie Ann M.D.   On: 02/11/2022 07:22   CT HEAD WO CONTRAST  Result Date: 02/11/2022 CLINICAL DATA:  Neuro deficit; acute stroke suspected EXAM: CT HEAD WITHOUT CONTRAST TECHNIQUE: Contiguous axial images were obtained  from the base of the skull through the vertex without intravenous contrast. RADIATION DOSE REDUCTION: This exam was performed according to the departmental dose-optimization program which includes automated exposure control, adjustment of the mA and/or kV according to patient size and/or use of iterative reconstruction technique. COMPARISON:  CT head 10/12/2021 FINDINGS: Brain: No intracranial hemorrhage, mass  effect, or evidence of acute infarct. No hydrocephalus. No extra-axial fluid collection. Generalized cerebral atrophy. Ill-defined hypoattenuation within the cerebral white matter is nonspecific but consistent with chronic small vessel ischemic disease. Chronic infarct right thalamus. Callosal dysgenesis. Vascular: No hyperdense vessel. Intracranial arterial calcification. Skull: No fracture or focal lesion. Sinuses/Orbits: No acute finding. Mucous retention cyst left maxillary sinus. Other: None. IMPRESSION: No acute intracranial abnormality. Age-related atrophy and chronic microvascular ischemic change. Electronically Signed   By: Placido Sou M.D.   On: 02/11/2022 03:17     Subjective: - no chest pain, shortness of breath, no abdominal pain, nausea or vomiting.   Discharge Exam: BP 128/66 (BP Location: Right Arm)   Pulse 65   Temp 97.8 F (36.6 C) (Oral)   Resp 15   Wt 73.7 kg   SpO2 92%   BMI 23.99 kg/m   General: Pt is alert, awake, not in acute distress Cardiovascular: RRR, S1/S2 +, no rubs, no gallops Respiratory: CTA bilaterally, no wheezing, no rhonchi Abdominal: Soft, NT, ND, bowel sounds + Extremities: no edema, no cyanosis    The results of significant diagnostics from this hospitalization (including imaging, microbiology, ancillary and laboratory) are listed below for reference.     Microbiology: Recent Results (from the past 240 hour(s))  Resp Panel by RT-PCR (Flu A&B, Covid) Anterior Nasal Swab     Status: None   Collection Time: 02/11/22  2:30 AM   Specimen: Anterior Nasal Swab  Result Value Ref Range Status   SARS Coronavirus 2 by RT PCR NEGATIVE NEGATIVE Final    Comment: (NOTE) SARS-CoV-2 target nucleic acids are NOT DETECTED.  The SARS-CoV-2 RNA is generally detectable in upper respiratory specimens during the acute phase of infection. The lowest concentration of SARS-CoV-2 viral copies this assay can detect is 138 copies/mL. A negative result does not  preclude SARS-Cov-2 infection and should not be used as the sole basis for treatment or other patient management decisions. A negative result may occur with  improper specimen collection/handling, submission of specimen other than nasopharyngeal swab, presence of viral mutation(s) within the areas targeted by this assay, and inadequate number of viral copies(<138 copies/mL). A negative result must be combined with clinical observations, patient history, and epidemiological information. The expected result is Negative.  Fact Sheet for Patients:  EntrepreneurPulse.com.au  Fact Sheet for Healthcare Providers:  IncredibleEmployment.be  This test is no t yet approved or cleared by the [place] FDA and  has been authorized for detection and/or diagnosis of SARS-CoV-2 by FDA under an Emergency Use Authorization (EUA). This EUA will remain  in effect (meaning this test can be used) for the duration of the COVID-19 declaration under Section 564(b)(1) of the Act, 21 U.S.C.section 360bbb-3(b)(1), unless the authorization is terminated  or revoked sooner.       Influenza A by PCR NEGATIVE NEGATIVE Final   Influenza B by PCR NEGATIVE NEGATIVE Final    Comment: (NOTE) The Xpert Xpress SARS-CoV-2/FLU/RSV plus assay is intended as an aid in the diagnosis of influenza from Nasopharyngeal swab specimens and should not be used as a sole basis for treatment. Nasal washings and aspirates are unacceptable for Xpert  Xpress SARS-CoV-2/FLU/RSV testing.  Fact Sheet for Patients: EntrepreneurPulse.com.au  Fact Sheet for Healthcare Providers: IncredibleEmployment.be  This test is not yet approved or cleared by the [place] FDA and has been authorized for detection and/or diagnosis of SARS-CoV-2 by FDA under an Emergency Use Authorization (EUA). This EUA will remain in effect (meaning this test can be used) for the  duration of the COVID-19 declaration under Section 564(b)(1) of the Act, 21 U.S.C. section 360bbb-3(b)(1), unless the authorization is terminated or revoked.  Performed at [facility], [place] [address] [place], [place] [address]   Urine Culture     Status: Abnormal (Preliminary result)   Collection Time: 02/11/22 10:20 AM   Specimen: Urine, Clean Catch  Result Value Ref Range Status   Specimen Description URINE, CLEAN CATCH  Final   Special Requests NONE  Final   Culture (A)  Final    >=100,000 COLONIES/mL ENTEROCOCCUS FAECALIS CULTURE REINCUBATED FOR BETTER GROWTH Performed at [facility], [place] [address] [place], [place] [address]    Report Status PENDING  Incomplete   Organism ID, Bacteria ENTEROCOCCUS FAECALIS (A)  Final      Susceptibility   Enterococcus faecalis - MIC*    AMPICILLIN <=2 SENSITIVE Sensitive     NITROFURANTOIN <=16 SENSITIVE Sensitive     VANCOMYCIN 1 SENSITIVE Sensitive     * >=100,000 COLONIES/mL ENTEROCOCCUS FAECALIS  Culture, blood (routine x 2)     Status: None (Preliminary result)   Collection Time: 02/11/22 10:52 AM   Specimen: BLOOD  Result Value Ref Range Status   Specimen Description BLOOD SITE NOT SPECIFIED  Final   Special Requests   Final    BOTTLES DRAWN AEROBIC AND ANAEROBIC Blood Culture adequate volume   Culture   Final    NO GROWTH 4 DAYS Performed at [facility], [address] [place], [place] [address]    Report Status PENDING  Incomplete  Culture, blood (routine x 2)     Status: None (Preliminary result)   Collection Time: 02/11/22 10:53 AM   Specimen: BLOOD LEFT WRIST  Result Value Ref Range Status   Specimen Description BLOOD LEFT WRIST  Final   Special Requests   Final    BOTTLES DRAWN AEROBIC AND ANAEROBIC Blood Culture adequate volume   Culture   Final    NO GROWTH 4 DAYS Performed at [facility], [place] [address] [place], [place] [address]    Report Status PENDING  Incomplete      Labs: Basic Metabolic Panel: Recent Labs  Lab 02/11/22 0207 02/11/22 0223 02/12/22 0420 02/13/22 0219 02/14/22 0200 02/15/22 0309  NA 138 139 139 141 140 142  K 4.2 7.1* 3.7 3.8 3.5 3.8  CL 109 108 110 109 109 109  CO2 25  --  '22 22 26 25  '$ GLUCOSE 101* 96 83 131* 103* 114*  BUN 18 28* '17 16 19 18  '$ CREATININE 0.83 0.70 0.85 0.94 0.78 0.77  CALCIUM 9.3  --  8.5* 9.4 8.6* 8.8*  MG  --   --   --  1.8 1.6* 1.6*   Liver Function Tests: Recent Labs  Lab 02/11/22 0207  AST 32  ALT 30  ALKPHOS 98  BILITOT 0.8  PROT 7.0  ALBUMIN 3.3*   CBC: Recent Labs  Lab 02/11/22 0207 02/11/22 0223 02/12/22 0420 02/13/22 0219 02/14/22 0200 02/15/22 0309  WBC 6.1  --  6.8 7.5 6.4 7.5  NEUTROABS 3.7  --   --  5.7 4.1 5.5  HGB 15.1 14.3 12.6* 14.8 12.3* 12.5*  HCT 46.6 42.0 37.2* 44.3 37.2* 38.3*  MCV 88.3  --  86.3 87.0 87.3 86.5  PLT 178  --  123* 150 135* 142*   CBG: No results for input(s): "GLUCAP" in the last 168 hours. Hgb A1c No results for input(s): "HGBA1C" in the last 72 hours. Lipid Profile No results for input(s): "CHOL", "HDL", "LDLCALC", "TRIG", "CHOLHDL", "LDLDIRECT" in the last 72 hours. Thyroid function studies No results for input(s): "TSH", "T4TOTAL", "T3FREE", "THYROIDAB" in the last 72 hours.  Invalid input(s): "FREET3" Urinalysis    Component Value Date/Time   COLORURINE YELLOW 02/11/2022 0819   APPEARANCEUR CLOUDY (A) 02/11/2022 0819   LABSPEC 1.020 02/11/2022 0819   PHURINE 8.5 (H) 02/11/2022 0819   GLUCOSEU NEGATIVE 02/11/2022 0819   HGBUR LARGE (A) 02/11/2022 0819   BILIRUBINUR NEGATIVE 02/11/2022 0819   KETONESUR NEGATIVE 02/11/2022 0819   PROTEINUR 30 (A) 02/11/2022 0819   UROBILINOGEN 1.0 09/22/2014 0726   NITRITE POSITIVE (A) 02/11/2022 0819   LEUKOCYTESUR LARGE (A) 02/11/2022 0819    FURTHER DISCHARGE INSTRUCTIONS:   Get Medicines reviewed and adjusted: Please take all your medications with you for your next visit with your Primary  MD   Laboratory/radiological data: Please request your Primary MD to go over all hospital tests and procedure/radiological results at the follow up, please ask your Primary MD to get all Hospital records sent to his/her office.   In some cases, they will be blood work, cultures and biopsy results pending at the time of your discharge. Please request that your primary care M.D. goes through all the records of your hospital data and follows up on these results.   Also Note the following: If you experience worsening of your admission symptoms, develop shortness of breath, life threatening emergency, suicidal or homicidal thoughts you must seek medical attention immediately by calling 911 or calling your MD immediately  if symptoms less severe.   You must read complete instructions/literature along with all the possible adverse reactions/side effects for all the Medicines you take and that have been prescribed to you. Take any new Medicines after you have completely understood and accpet all the possible adverse reactions/side effects.    Do not drive when taking Pain medications or sleeping medications (Benzodaizepines)   Do not take more than prescribed Pain, Sleep and Anxiety Medications. It is not advisable to combine anxiety,sleep and pain medications without talking with your primary care practitioner   Special Instructions: If you have smoked or chewed Tobacco  in the last 2 yrs please stop smoking, stop any regular Alcohol  and or any Recreational drug use.   Wear Seat belts while driving.   Please note: You were cared for by a hospitalist during your hospital stay. Once you are discharged, your primary care physician will handle any further medical issues. Please note that NO REFILLS for any discharge medications will be authorized once you are discharged, as it is imperative that you return to your primary care physician (or establish a relationship with a primary care physician if you do  not have one) for your post hospital discharge needs so that they can reassess your need for medications and monitor your lab values.  Time coordinating discharge: 40 minutes  SIGNED:  Marzetta Board, MD, PhD 02/15/2022, 10:18 AM

## 2022-02-16 LAB — CULTURE, BLOOD (ROUTINE X 2)
Culture: NO GROWTH
Culture: NO GROWTH
Special Requests: ADEQUATE
Special Requests: ADEQUATE

## 2022-02-18 LAB — URINE CULTURE: Culture: >=100000 — AB

## 2022-07-15 ENCOUNTER — Emergency Department (HOSPITAL_COMMUNITY)
Admission: EM | Admit: 2022-07-15 | Discharge: 2022-07-15 | Disposition: A | Discharge status: Skilled Nursing Facility | Payer: Medicare Other | Attending: Emergency Medicine | Admitting: Emergency Medicine

## 2022-07-15 ENCOUNTER — Other Ambulatory Visit: Payer: Self-pay

## 2022-07-15 ENCOUNTER — Encounter (HOSPITAL_COMMUNITY): Payer: Self-pay

## 2022-07-15 ENCOUNTER — Emergency Department (HOSPITAL_COMMUNITY): Payer: Medicare Other

## 2022-07-15 DIAGNOSIS — I10 Essential (primary) hypertension: Secondary | ICD-10-CM | POA: Diagnosis not present

## 2022-07-15 DIAGNOSIS — M545 Low back pain, unspecified: Secondary | ICD-10-CM

## 2022-07-15 DIAGNOSIS — Z7902 Long term (current) use of antithrombotics/antiplatelets: Secondary | ICD-10-CM | POA: Diagnosis not present

## 2022-07-15 DIAGNOSIS — B974 Respiratory syncytial virus as the cause of diseases classified elsewhere: Secondary | ICD-10-CM | POA: Diagnosis not present

## 2022-07-15 DIAGNOSIS — R058 Other specified cough: Secondary | ICD-10-CM | POA: Insufficient documentation

## 2022-07-15 DIAGNOSIS — B338 Other specified viral diseases: Secondary | ICD-10-CM

## 2022-07-15 DIAGNOSIS — Z1152 Encounter for screening for COVID-19: Secondary | ICD-10-CM | POA: Diagnosis not present

## 2022-07-15 DIAGNOSIS — M546 Pain in thoracic spine: Secondary | ICD-10-CM | POA: Insufficient documentation

## 2022-07-15 LAB — CBC
HCT: 43.2 % (ref 39.0–52.0)
Hemoglobin: 14.3 g/dL (ref 13.0–17.0)
MCH: 28.8 pg (ref 26.0–34.0)
MCHC: 33.1 g/dL (ref 30.0–36.0)
MCV: 87.1 fL (ref 80.0–100.0)
Platelets: 172 10*3/uL (ref 150–400)
RBC: 4.96 MIL/uL (ref 4.22–5.81)
RDW: 14.7 % (ref 11.5–15.5)
WBC: 5.4 10*3/uL (ref 4.0–10.5)
nRBC: 0 % (ref 0.0–0.2)

## 2022-07-15 LAB — RESP PANEL BY RT-PCR (RSV, FLU A&B, COVID)  RVPGX2
Influenza A by PCR: NEGATIVE
Influenza B by PCR: NEGATIVE
Resp Syncytial Virus by PCR: POSITIVE — AB
SARS Coronavirus 2 by RT PCR: NEGATIVE

## 2022-07-15 LAB — BASIC METABOLIC PANEL
Anion gap: 10 (ref 5–15)
BUN: 21 mg/dL (ref 8–23)
CO2: 24 mmol/L (ref 22–32)
Calcium: 9.2 mg/dL (ref 8.9–10.3)
Chloride: 103 mmol/L (ref 98–111)
Creatinine, Ser: 1.02 mg/dL (ref 0.61–1.24)
GFR, Estimated: >60 mL/min (ref >60)
Glucose, Bld: 94 mg/dL (ref 70–99)
Potassium: 4.2 mmol/L (ref 3.5–5.1)
Sodium: 137 mmol/L (ref 135–145)

## 2022-07-15 MED ORDER — OXYCODONE-ACETAMINOPHEN 5-325 MG PO TABS
2.0000 | ORAL_TABLET | Freq: Once | ORAL | Status: AC
  Administered 2022-07-15: 2 via ORAL
  Filled 2022-07-15: qty 2

## 2022-07-15 MED ORDER — ALBUTEROL SULFATE HFA 108 (90 BASE) MCG/ACT IN AERS
1.0000 | INHALATION_SPRAY | Freq: Once | RESPIRATORY_TRACT | Status: AC
  Administered 2022-07-15: 1 via RESPIRATORY_TRACT

## 2022-07-15 MED ORDER — KETOROLAC TROMETHAMINE 15 MG/ML IJ SOLN
15.0000 mg | Freq: Once | INTRAMUSCULAR | Status: AC
  Administered 2022-07-15: 15 mg via INTRAMUSCULAR
  Filled 2022-07-15: qty 1

## 2022-07-15 MED ORDER — ALBUTEROL SULFATE HFA 108 (90 BASE) MCG/ACT IN AERS: 1.0000 | INHALATION_SPRAY | Freq: Four times a day (QID) | RESPIRATORY_TRACT | 0 refills | Status: DC | PRN

## 2022-07-15 NOTE — ED Notes (Signed)
Patient transported to X-ray 

## 2022-07-15 NOTE — ED Notes (Signed)
Family member Olena Leatherwood [phone] would like an update asap

## 2022-07-15 NOTE — ED Provider Notes (Signed)
[facility] Provider Note   CSN: [id number] Arrival date & time: 07/15/22  1758     History  Chief Complaint  Patient presents with   Back Pain    Gregory Kerr is a 82 y.o. 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  Patient reports back pain is severe at 10 out of 10, he has been taking his home tramadol without significant relief.  He denies any recent fall or injury.  He is wheelchair-bound, but does not transfer on his own, only transferring with assistance.  He reports some chest congestion, severe cough for the last several days.  He denies fever at home.  He denies any chest pain at time my evaluation.   Back Pain      Home Medications Prior to Admission medications   Medication Sig Start Date End Date Taking? Authorizing Provider  albuterol (VENTOLIN HFA) 108 (90 Base) MCG/ACT inhaler Inhale 1-2 puffs into the lungs every 6 (six) hours as needed for wheezing or shortness of breath. 07/15/22  Yes Kru Allman H, PA-C  ARTIFICIAL TEAR OP Place 1 drop into both eyes 3 (three) times daily. 01/10/22   [provider]  atorvastatin (LIPITOR) 40 MG tablet Take 1 tablet (40 mg total) by mouth daily. Patient taking differently: Take 40 mg by mouth every evening. 11/22/20   Lyndal Pulley, MD  baclofen (LIORESAL) 20 MG tablet Take 20 mg by mouth 3 (three) times daily.    [provider]  bisacodyl (DULCOLAX) 10 MG suppository Place 10 mg rectally daily as needed for moderate constipation. 10/18/21   [provider]  calcium-vitamin D (OSCAL WITH D) 500-5 MG-MCG tablet Take 1 tablet by mouth 2 (two) times daily.    [provider]  clopidogrel (PLAVIX) 75 MG tablet Take 1 tablet (75 mg total) by mouth daily. 11/22/20   Lyndal Pulley, MD  Dextromethorphan-quiNIDine (NUEDEXTA) 20-10 MG capsule Take 1 capsule by mouth every evening. For Pseudobulbar Affect (PBA)    [provider]  gabapentin (NEURONTIN) 100 MG capsule Take 100 mg by mouth 3 (three) times daily.    [provider]  guaiFENesin (MUCINEX) 600 MG 12 hr tablet Take 600 mg by mouth 2 (two) times daily.    [provider]  loperamide (IMODIUM A-D) 2 MG tablet Take 4 mg by mouth 4 (four) times daily as needed for diarrhea or loose stools.    [provider]  loratadine (CLARITIN) 10 MG tablet Take 10 mg by mouth every evening.    [provider]  nitroGLYCERIN (NITROSTAT) 0.4 MG SL tablet Place 0.4 mg under the tongue every 5 (five) minutes as needed for chest pain.    [provider]  nystatin (MYCOSTATIN/NYSTOP) powder Apply 1 application  topically 3 (three) times daily. 1 application TID to suprapubic area, bilateral groin and penis 01/10/22   [provider]  pantoprazole (PROTONIX) 20 MG tablet Take 20 mg by mouth daily. 11/17/20   [provider]  phenazopyridine (PYRIDIUM) 100 MG tablet Take 100 mg by mouth every 8 (eight) hours as needed (bladder spasms).    [provider]  polyethylene glycol (MIRALAX / GLYCOLAX) packet Take 17 g by mouth daily as needed (constipation).     [provider]  Prenatal Vit-Fe Fumarate-FA (PREPLUS) 27-1 MG TABS Take  1 tablet by mouth daily.    [provider]  PROLIA 60 MG/ML SOSY injection Inject 60 mg into the skin every 6 (six) months. 09/07/20   [provider]  saccharomyces boulardii (FLORASTOR) 250 MG capsule Take 250 mg by mouth every 12 (twelve) hours.    [provider]  sertraline (ZOLOFT) 100 MG tablet Take 100 mg by mouth daily.    [provider]  solifenacin (VESICARE) 5 MG tablet Take 1 tablet (5 mg total) by mouth daily. 11/21/20   Lyndal Pulley, MD  tamsulosin (FLOMAX) 0.4 MG CAPS  capsule Take 1 capsule (0.4 mg total) by mouth at bedtime. 11/21/20   Lyndal Pulley, MD  traMADol (ULTRAM) 50 MG tablet Take 1 tablet (50 mg total) by mouth See admin instructions. Take 50 mg twice daily scheduled, may take a 3rd 50 mg dose as needed for pain 02/15/22   Caren Griffins, MD  trimethoprim (TRIMPEX) 100 MG tablet Take 100 mg by mouth daily. **No stop date** per [facility] 02/03/22   [provider]      Allergies    Penicillins    Review of Systems   Review of Systems  Musculoskeletal:  Positive for back pain.  All other systems reviewed and are negative.   Physical Exam Updated Vital Signs BP (!) 149/79 (BP Location: Right Arm)   Pulse 73   Temp 98 F (36.7 C) (Oral)   Resp 18   SpO2 99%  Physical Exam Vitals and nursing note reviewed.  Constitutional:      General: He is not in acute distress.    Appearance: Normal appearance. He is ill-appearing.  HENT:     Head: Normocephalic and atraumatic.  Eyes:     General:        Right eye: No discharge.        Left eye: No discharge.  Cardiovascular:     Rate and Rhythm: Normal rate and regular rhythm.     Heart sounds: No murmur heard.    No friction rub. No gallop.  Pulmonary:     Effort: Pulmonary effort is normal.     Breath sounds: Normal breath sounds.     Comments: Wet hacking cough throughout my evaluation, scattered rhonchi on auscultation of lungs Abdominal:     General: Bowel sounds are normal.     Palpations: Abdomen is soft.  Musculoskeletal:     Comments: Midline lumbar spinal tenderness noted, no step-off, deformity, he reports similar strength in his normal right leg possible minimal weakness, is difficult for me to ascertain any neurologic deficits from his baseline on my exam.  Skin:    General: Skin is warm and dry.     Capillary Refill: Capillary refill takes less than 2 seconds.  Neurological:     Mental Status: He is alert and oriented to person, place, and time.  Psychiatric:         Mood and Affect: Mood normal.        Behavior: Behavior normal.     ED Results / Procedures / Treatments   Labs (all labs ordered are listed, but only abnormal results are displayed) Labs Reviewed  RESP PANEL BY RT-PCR (RSV, FLU A&B, COVID)  [id number] - Abnormal; Notable for the following components:      Result Value   Resp Syncytial Virus by PCR POSITIVE (*)    All other components within normal limits  CBC  BASIC METABOLIC PANEL    EKG None  Radiology DG Chest 2 View  Result Date: 07/15/2022 CLINICAL DATA:  Shortness of breath, cough and congestion. EXAM: CHEST - 2 VIEW COMPARISON:  02/12/2022 and prior studies FINDINGS: This is a low volume study. Cardiomediastinal silhouette is unchanged. A large hiatal hernia is again noted. Mild bibasilar opacities are noted, favor atelectasis. There is no evidence of pneumothorax or large pleural effusion. IMPRESSION: Low volume study with mild bibasilar opacities, favor atelectasis. Electronically Signed   By: Margarette Canada M.D.   On: 07/15/2022 19:29    Procedures Procedures    Medications Ordered in ED Medications  albuterol (VENTOLIN HFA) 108 (90 Base) MCG/ACT inhaler 1 puff (has no administration in time range)  ketorolac (TORADOL) 15 MG/ML injection 15 mg (15 mg Intramuscular Given 07/15/22 1847)  oxyCODONE-acetaminophen (PERCOCET/ROXICET) 5-325 MG per tablet 2 tablet (2 tablets Oral Given 07/15/22 1846)    ED Course/ Medical Decision Making/ A&P                             Medical Decision Making Amount and/or Complexity of Data Reviewed Labs: ordered. Radiology: ordered.  Risk Prescription drug management.  This patient is a 82 y.o. male  who presents to the ED for concern of back pain, body aches, cough, congestion.   Differential diagnoses prior to evaluation: The emergent differential diagnosis includes, but is not limited to, muscle strain, sprain, epidural abscess, osteomyelitis, compression fracture, or other acute  injury of the spine, versus other simple musculoskeletal injury, for URI symptoms considered viral URI, COVID, flu, RSV. This is not an exhaustive differential.   Past Medical History / Co-morbidities: Spastic hemiplegia secondary to previous stroke, chronic indwelling Foley catheter  Additional history: Chart reviewed. Pertinent results include:  reviewed lab work, imaging from recent previous emergency department visits as applicable  Physical Exam: Physical exam performed. The pertinent findings include: Patient with some upper respiratory wheezing, but overall without any wheezing in the lower respiratory fields, some rhonchi noted throughout.  No respiratory distress, no tachypnea.  He is stable oxygen saturation on room air.  He does have some tenderness around the lumbar paraspinous muscles and midline lumbar spine.  Patient has intact strength of the right lower extremity compared to his baseline.  He has no sensory deficits on my exam compared to his baseline.  Lab Tests/Imaging studies: I personally interpreted labs/imaging and the pertinent results include: CBC unremarkable, BMP unremarkable, RVP positive for RSV. CXR shows low lung volumes compatible with atelectasis, no evidence of acute pneumonia. I agree with the radiologist interpretation.   Medications: I ordered medication including toradol, percocet for pain. Albuterol for mild wheezing.  I have reviewed the patients home medicines and have made adjustments as needed. DC with albuterol prn.   Disposition: After consideration of the diagnostic results and the patients response to treatment, I feel that patient is stable for discharge no evidence of acute or infectious back injury, discussed symptoms likely worsened from baseline 2/2 RSV infection .   emergency department workup does not suggest an emergent condition requiring admission or immediate intervention beyond what has been performed at this time. The plan is: as above.  The patient is safe for discharge and has been instructed to return immediately for worsening symptoms, change in symptoms or any other concerns.  Final Clinical Impression(s) / ED Diagnoses Final diagnoses:  Acute midline low back pain without sciatica  RSV (respiratory syncytial virus infection)    Rx / DC Orders  ED Discharge Orders          Ordered    albuterol (VENTOLIN HFA) 108 (90 Base) MCG/ACT inhaler  Every 6 hours PRN        07/15/22 2027              Dorien Chihuahua 07/15/22 2039    Blanchie Dessert, MD 07/16/22 1546

## 2022-07-15 NOTE — ED Triage Notes (Signed)
Pt BIB EMS due to back pain. Pt is contracted due to past stroke. Pt has been taking tramadol that hasn't been helping. Pt is axox3. VSS.

## 2022-07-15 NOTE — Discharge Instructions (Signed)
Please use Tylenol or ibuprofen for pain.  You may use 600 mg ibuprofen every 6 hours or 1000 mg of Tylenol every 6 hours.  You may choose to alternate between the 2.  This would be most effective.  Not to exceed 4 g of Tylenol within 24 hours.  Not to exceed 3200 mg ibuprofen 24 hours.  Use the above regimen in addition to your tramadol at home for your back pain.  You can use the inhaler I gave you as needed for any wheezing or difficulty breathing related to your RSV infection, your body aches should improve once your virus has resolved, if you have worsening weakness, new numbness, tingling, inability to control your urine or bowels compared to your normal baseline please return for further evaluation and management

## 2022-08-29 ENCOUNTER — Encounter: Payer: Self-pay | Admitting: General Practice

## 2022-08-29 ENCOUNTER — Other Ambulatory Visit (HOSPITAL_COMMUNITY): Payer: Self-pay | Admitting: Urology

## 2022-08-29 DIAGNOSIS — N3020 Other chronic cystitis without hematuria: Secondary | ICD-10-CM

## 2022-08-29 DIAGNOSIS — R3914 Feeling of incomplete bladder emptying: Secondary | ICD-10-CM

## 2022-08-29 NOTE — Progress Notes (Signed)
Berdine Dance, MD  Caroleen Hamman, NT Ok for CT SP tube placement  TS       Previous Messages    ----- Message ----- From: Caroleen Hamman, NT Sent: 08/29/2022  12:48 PM EDT To: [facility] Procedure Requests Subject: CT GUIDED PERITONEAL/RETROPERITONEAL FLUID D*  Procedure: CT GUIDED PERITONEAL/RETROPERITONEAL FLUID DRAIN BY PERC CATH/ Supra-pubic tube placement  Reason: Chronic Cystitis WO hematuria, incomplete bladder emptying  History: CT in chart  Provider: Rene Paci, MD  Contact: [phone]

## 2022-09-10 ENCOUNTER — Other Ambulatory Visit (HOSPITAL_COMMUNITY): Payer: Self-pay | Admitting: Student

## 2022-09-10 DIAGNOSIS — N3289 Other specified disorders of bladder: Secondary | ICD-10-CM

## 2022-09-10 NOTE — H&P (Signed)
  Patient here today for SP tube placement.  Plavix was not held by facility so will have to be re-scheduled.  Also, patient adamantly refuses the procedures.  There are no notes from Urology scanned in that document medical necessity.  I will send a staff message to Dr. Sande Brothers and request office notes for next visit.  Demiana Crumbley S Alexxander Kurt PA-C 09/11/2022 8:20 AM

## 2022-09-11 ENCOUNTER — Ambulatory Visit (HOSPITAL_COMMUNITY)
Admission: RE | Admit: 2022-09-11 | Discharge: 2022-09-11 | Disposition: A | Discharge status: Home / Self Care | Payer: Medicare Other | Source: Ambulatory Visit | Attending: Urology | Admitting: Urology

## 2022-09-11 ENCOUNTER — Encounter (HOSPITAL_COMMUNITY): Payer: Self-pay

## 2022-09-11 DIAGNOSIS — N3020 Other chronic cystitis without hematuria: Secondary | ICD-10-CM | POA: Insufficient documentation

## 2022-09-11 DIAGNOSIS — R3914 Feeling of incomplete bladder emptying: Secondary | ICD-10-CM | POA: Diagnosis present

## 2022-09-11 DIAGNOSIS — N3289 Other specified disorders of bladder: Secondary | ICD-10-CM | POA: Diagnosis present

## 2022-09-11 MED ORDER — SODIUM CHLORIDE 0.9 % IV SOLN: INTRAVENOUS | Status: DC

## 2022-09-29 ENCOUNTER — Ambulatory Visit (HOSPITAL_COMMUNITY): Payer: Medicare Other

## 2022-10-17 ENCOUNTER — Emergency Department (HOSPITAL_COMMUNITY): Payer: Medicare Other

## 2022-10-17 ENCOUNTER — Other Ambulatory Visit: Payer: Self-pay

## 2022-10-17 ENCOUNTER — Emergency Department (HOSPITAL_COMMUNITY)
Admission: EM | Admit: 2022-10-17 | Discharge: 2022-10-17 | Disposition: A | Discharge status: Skilled Nursing Facility | Payer: Medicare Other | Attending: Emergency Medicine | Admitting: Emergency Medicine

## 2022-10-17 ENCOUNTER — Encounter (HOSPITAL_COMMUNITY): Payer: Self-pay

## 2022-10-17 DIAGNOSIS — R42 Dizziness and giddiness: Secondary | ICD-10-CM | POA: Insufficient documentation

## 2022-10-17 DIAGNOSIS — R4182 Altered mental status, unspecified: Secondary | ICD-10-CM | POA: Diagnosis not present

## 2022-10-17 DIAGNOSIS — Z7901 Long term (current) use of anticoagulants: Secondary | ICD-10-CM | POA: Insufficient documentation

## 2022-10-17 DIAGNOSIS — I69354 Hemiplegia and hemiparesis following cerebral infarction affecting left non-dominant side: Secondary | ICD-10-CM | POA: Insufficient documentation

## 2022-10-17 DIAGNOSIS — D72829 Elevated white blood cell count, unspecified: Secondary | ICD-10-CM | POA: Insufficient documentation

## 2022-10-17 DIAGNOSIS — N3000 Acute cystitis without hematuria: Secondary | ICD-10-CM

## 2022-10-17 LAB — COMPREHENSIVE METABOLIC PANEL
ALT: 32 U/L (ref 0–44)
AST: 36 U/L (ref 15–41)
Albumin: 3 g/dL — ABNORMAL LOW (ref 3.5–5.0)
Alkaline Phosphatase: 83 U/L (ref 38–126)
Anion gap: 8 (ref 5–15)
BUN: 18 mg/dL (ref 8–23)
CO2: 23 mmol/L (ref 22–32)
Calcium: 9.4 mg/dL (ref 8.9–10.3)
Chloride: 107 mmol/L (ref 98–111)
Creatinine, Ser: 0.9 mg/dL (ref 0.61–1.24)
GFR, Estimated: >60 mL/min (ref >60)
Glucose, Bld: 108 mg/dL — ABNORMAL HIGH (ref 70–99)
Potassium: 3.5 mmol/L (ref 3.5–5.1)
Sodium: 138 mmol/L (ref 135–145)
Total Bilirubin: 0.8 mg/dL (ref 0.3–1.2)
Total Protein: 6.6 g/dL (ref 6.5–8.1)

## 2022-10-17 LAB — URINALYSIS, MICROSCOPIC (REFLEX)

## 2022-10-17 LAB — URINALYSIS, ROUTINE W REFLEX MICROSCOPIC
Glucose, UA: NEGATIVE mg/dL
Ketones, ur: NEGATIVE mg/dL
Nitrite: POSITIVE — AB
Protein, ur: >300 mg/dL — AB
Specific Gravity, Urine: 1.01 (ref 1.005–1.030)
pH: 8.5 — ABNORMAL HIGH (ref 5.0–8.0)

## 2022-10-17 LAB — I-STAT ARTERIAL BLOOD GAS, ED
Acid-base deficit: 1 mmol/L (ref 0.0–2.0)
Bicarbonate: 23.1 mmol/L (ref 20.0–28.0)
Calcium, Ion: 1.31 mmol/L (ref 1.15–1.40)
HCT: 39 % (ref 39.0–52.0)
Hemoglobin: 13.3 g/dL (ref 13.0–17.0)
O2 Saturation: 96 %
Patient temperature: 98.8
Potassium: 3.7 mmol/L (ref 3.5–5.1)
Sodium: 141 mmol/L (ref 135–145)
TCO2: 24 mmol/L (ref 22–32)
pCO2 arterial: 35.2 mmHg (ref 32–48)
pH, Arterial: 7.426 (ref 7.35–7.45)
pO2, Arterial: 78 mmHg — ABNORMAL LOW (ref 83–108)

## 2022-10-17 LAB — I-STAT CHEM 8, ED
BUN: 21 mg/dL (ref 8–23)
Calcium, Ion: 1.28 mmol/L (ref 1.15–1.40)
Chloride: 106 mmol/L (ref 98–111)
Creatinine, Ser: 0.7 mg/dL (ref 0.61–1.24)
Glucose, Bld: 107 mg/dL — ABNORMAL HIGH (ref 70–99)
HCT: 42 % (ref 39.0–52.0)
Hemoglobin: 14.3 g/dL (ref 13.0–17.0)
Potassium: 3.6 mmol/L (ref 3.5–5.1)
Sodium: 143 mmol/L (ref 135–145)
TCO2: 26 mmol/L (ref 22–32)

## 2022-10-17 LAB — CBC
HCT: 43.3 % (ref 39.0–52.0)
Hemoglobin: 14.4 g/dL (ref 13.0–17.0)
MCH: 30.1 pg (ref 26.0–34.0)
MCHC: 33.3 g/dL (ref 30.0–36.0)
MCV: 90.4 fL (ref 80.0–100.0)
Platelets: 174 10*3/uL (ref 150–400)
RBC: 4.79 MIL/uL (ref 4.22–5.81)
RDW: 14.3 % (ref 11.5–15.5)
WBC: 17.4 10*3/uL — ABNORMAL HIGH (ref 4.0–10.5)
nRBC: 0 % (ref 0.0–0.2)

## 2022-10-17 LAB — TROPONIN I (HIGH SENSITIVITY): Troponin I (High Sensitivity): 10 ng/L (ref <18)

## 2022-10-17 MED ORDER — AMOXICILLIN-POT CLAVULANATE 875-125 MG PO TABS: 1.0000 | ORAL_TABLET | Freq: Two times a day (BID) | ORAL | 0 refills | Status: AC

## 2022-10-17 MED ORDER — AMOXICILLIN-POT CLAVULANATE 875-125 MG PO TABS
1.0000 | ORAL_TABLET | Freq: Once | ORAL | Status: AC
  Administered 2022-10-17: 1 via ORAL
  Filled 2022-10-17: qty 1

## 2022-10-17 MED ORDER — LACTATED RINGERS IV BOLUS
500.0000 mL | Freq: Once | INTRAVENOUS | Status: AC
  Administered 2022-10-17: 500 mL via INTRAVENOUS

## 2022-10-17 NOTE — Discharge Instructions (Signed)
You were seen for your dizziness and urinary tract infection in the emergency department.   At home, please continue taking the Augmentin you are prescribed.    Check your MyChart online for the results of any tests that had not resulted by the time you left the emergency department.   Follow-up with your primary doctor in 2-3 days regarding your visit.    Return immediately to the emergency department if you experience any of the following: Fevers, flank pain, or any other concerning symptoms.    Thank you for visiting our Emergency Department. It was a pleasure taking care of you today.

## 2022-10-17 NOTE — ED Provider Notes (Signed)
  Physical Exam  BP (!) 174/57   Pulse 100   Temp 98.6 F (37 C) (Oral)   Resp 14   Ht 5\' 6"  (1.676 m)   Wt 78.4 kg   SpO2 96%   BMI 27.90 kg/m   Physical Exam  Procedures  Procedures  ED Course / MDM   Clinical Course as of 10/17/22 2243  Tue Oct 17, 2022  1629 Assumed care from Dr. Rosalia Hammers.  82 yo M with multiple strokes who is bed bound from his deficits. Today had decreased responsiveness. Was AAOx3 per EMS when they arrived. No new neuro deficits. No acute findings on his head CT. Labs show WBC of 17k. Getting CXR and UA. If these look ok he should be able to go back to his facility.  [RP]  2243 Patient's labs have returned and shows that he has a UTI.  Was complaining of some persistent dizziness that feels like he is lightheaded so was given a bolus of fluids.  Discussed his allergies and susceptibilities on prior UTIs with pharmacy and feel that he would be suitable for Augmentin.  Appears he has had both amoxicillin and Augmentin before without any sort of allergic reaction.  Will give him a dose here in the emergency department and observe for any allergic reaction symptoms but feel this is highly unlikely.  Will have him follow-up with his primary doctor in several days [RP]    Clinical Course User Index [RP] Rondel Baton, MD   Medical Decision Making Amount and/or Complexity of Data Reviewed Labs: ordered. Radiology: ordered.  Risk Prescription drug management.     Rondel Baton, MD 10/17/22 [phone]

## 2022-10-17 NOTE — ED Notes (Signed)
[facility] called, no eta 

## 2022-10-17 NOTE — ED Triage Notes (Signed)
From [facility] called for patient being unresponsive. Woke up after EMS moved him. Patient reports dizziness.

## 2022-10-17 NOTE — ED Notes (Signed)
Patient resting,no distress.

## 2022-10-17 NOTE — ED Notes (Addendum)
Talked to [place], Charity fundraiser at [employer] about patient returning and his diagnosis.

## 2022-10-17 NOTE — ED Provider Notes (Signed)
[place] EMERGENCY DEPARTMENT AT [facility] Provider Note   CSN: [id number] Arrival date & time: 10/17/22  1425     History  Chief Complaint  Patient presents with   Dizziness    Gregory Kerr is a 82 y.o. male.  HPI Level 5 caveat   82 year old male history of stroke with spastic left hemiplegia presents today from facility with reports that he is less responsive than usual. Per EMS he was less responsive than usual.  When they were woke him up and moved him he was alert and oriented x 4.  He complained of some dizziness. No report of chest pain, cough, fever, chills.  He has a dressing in place anterior to his right ear.  EMS reports that they had difficulty locating staff for full report  Home Medications Prior to Admission medications   Medication Sig Start Date End Date Taking? Authorizing Provider  albuterol (VENTOLIN HFA) 108 (90 Base) MCG/ACT inhaler Inhale 1-2 puffs into the lungs every 6 (six) hours as needed for wheezing or shortness of breath. 07/15/22   Prosperi, Christian H, PA-C  ARTIFICIAL TEAR OP Place 1 drop into both eyes 3 (three) times daily. 01/10/22   [provider]  atorvastatin (LIPITOR) 40 MG tablet Take 1 tablet (40 mg total) by mouth daily. Patient taking differently: Take 40 mg by mouth every evening. 11/22/20   Gardenia Phlegm, MD  baclofen (LIORESAL) 20 MG tablet Take 20 mg by mouth 3 (three) times daily.    [provider]  bisacodyl (DULCOLAX) 10 MG suppository Place 10 mg rectally daily as needed for moderate constipation. 10/18/21   [provider]  calcium-vitamin D (OSCAL WITH D) 500-5 MG-MCG tablet Take 1 tablet by mouth 2 (two) times daily.    [provider]  clopidogrel (PLAVIX) 75 MG tablet Take 1 tablet (75 mg total) by mouth daily. 11/22/20   Gardenia Phlegm, MD  Dextromethorphan-quiNIDine (NUEDEXTA) 20-10 MG capsule Take 1 capsule by mouth every evening. For Pseudobulbar Affect (PBA)    [provider]  gabapentin (NEURONTIN) 100 MG capsule Take 100 mg by mouth 3 (three) times daily.    [provider]  guaiFENesin (MUCINEX) 600 MG 12 hr tablet Take 600 mg by mouth 2 (two) times daily.    [provider]  loperamide (IMODIUM A-D) 2 MG tablet Take 4 mg by mouth 4 (four) times daily as needed for diarrhea or loose stools.    [provider]  loratadine (CLARITIN) 10 MG tablet Take 10 mg by mouth every evening.    [provider]  nitroGLYCERIN (NITROSTAT) 0.4 MG SL tablet Place 0.4 mg under the tongue every 5 (five) minutes as needed for chest pain.    [provider]  nystatin (MYCOSTATIN/NYSTOP) powder Apply 1 application  topically 3 (three) times daily. 1 application TID to suprapubic area, bilateral groin and penis 01/10/22   [provider]  pantoprazole (PROTONIX) 20 MG tablet Take 20 mg by mouth daily. 11/17/20   [provider]  phenazopyridine (PYRIDIUM) 100 MG tablet Take 100 mg by mouth every 8 (eight) hours as needed (bladder spasms).    [provider]  polyethylene glycol (MIRALAX / GLYCOLAX) packet Take 17 g by mouth daily as needed (constipation).     [provider]  Prenatal Vit-Fe Fumarate-FA (PREPLUS) 27-1 MG TABS Take 1 tablet by mouth daily.    [provider]  PROLIA 60 MG/ML SOSY injection Inject 60 mg  into the skin every 6 (six) months. 09/07/20   [provider]  saccharomyces boulardii (FLORASTOR) 250 MG capsule Take 250 mg by mouth every 12 (twelve) hours.    [provider]  sertraline (ZOLOFT) 100 MG tablet Take 100 mg by mouth daily.    [provider]  solifenacin (VESICARE) 5 MG tablet Take 1 tablet (5 mg total) by mouth daily. 11/21/20   Gardenia Phlegm, MD  tamsulosin (FLOMAX) 0.4 MG CAPS capsule Take 1 capsule (0.4 mg total) by mouth at bedtime. 11/21/20   Gardenia Phlegm, MD  traMADol (ULTRAM) 50 MG tablet Take 1 tablet (50 mg total) by mouth  See admin instructions. Take 50 mg twice daily scheduled, may take a 3rd 50 mg dose as needed for pain 02/15/22   [name], MD  trimethoprim (TRIMPEX) 100 MG tablet Take 100 mg by mouth daily. **No stop date** per [facility] 02/03/22   [provider]      Allergies    Penicillins    Review of Systems   Review of Systems  Physical Exam Updated Vital Signs BP 121/78 (BP Location: Right Arm)   Pulse 85   Temp 98.8 F (37.1 C) (Oral)   Resp 16   Ht 1.676 m (5\' 6" )   Wt 78.4 kg   SpO2 95%   BMI 27.90 kg/m  Physical Exam Vitals and nursing note reviewed.  Constitutional:      General: He is not in acute distress.    Appearance: He is ill-appearing.     Comments: Patient with eyes closed but responds to verbal stimuli  HENT:     Head: Normocephalic.     Comments: Dressing in place anterior to right air that when removed reveals wound    Right Ear: External ear normal.     Left Ear: External ear normal.     Nose: Nose normal.     Mouth/Throat:     Pharynx: Oropharynx is clear.  Eyes:     Extraocular Movements: Extraocular movements intact.     Pupils: Pupils are equal, round, and reactive to light.  Cardiovascular:     Rate and Rhythm: Normal rate and regular rhythm.     Pulses: Normal pulses.  Pulmonary:     Comments: Some decreased breath sounds bilateral bases Abdominal:     General: Abdomen is flat. Bowel sounds are normal.     Palpations: Abdomen is soft.  Musculoskeletal:     Cervical back: Normal range of motion.     Comments: Diffuse contractures noted Some early skin breakdown left buttocks Back examined no obvious signs of trauma or infection  Skin:    General: Skin is warm and dry.     Capillary Refill: Capillary refill takes less than 2 seconds.  Neurological:     Comments: Contractures of left upper extremity and left lower extremity Decreased range of motion weakness in right upper extremity and right lower extremity at baseline per patient      ED Results / Procedures / Treatments   Labs (all labs ordered are listed, but only abnormal results are displayed) Labs Reviewed  CBC - Abnormal; Notable for the following components:      Result Value   WBC 17.4 (*)    All other components within normal limits  COMPREHENSIVE METABOLIC PANEL - Abnormal; Notable for the following components:   Glucose, Bld 108 (*)    Albumin 3.0 (*)    All other components within normal limits  I-STAT ARTERIAL BLOOD GAS, ED - Abnormal; Notable for the following components:   pO2, Arterial 78 (*)    All other components within normal limits  I-STAT CHEM 8, ED - Abnormal; Notable for the following components:   Glucose, Bld 107 (*)    All other components within normal limits  CULTURE, BLOOD (ROUTINE X 2)  CULTURE, BLOOD (ROUTINE X 2)  URINALYSIS, ROUTINE W REFLEX MICROSCOPIC    EKG None  Radiology CT Head Wo Contrast  Result Date: 10/17/2022 CLINICAL DATA:  Altered mental status, dizziness EXAM: CT HEAD WITHOUT CONTRAST TECHNIQUE: Contiguous axial images were obtained from the base of the skull through the vertex without intravenous contrast. RADIATION DOSE REDUCTION: This exam was performed according to the departmental dose-optimization program which includes automated exposure control, adjustment of the mA and/or kV according to patient size and/or use of iterative reconstruction technique. COMPARISON:  02/11/2022 FINDINGS: Brain: No acute intracranial findings are seen. There are no signs of bleeding within the cranium. Old lacunar infarct is seen in right thalamus. There is prominence of third ventricle with no significant change. Cortical sulci are prominent. Corpus callosum appears hypoplastic. There is decreased density in periventricular and subcortical white matter. Vascular: Scattered arterial calcifications are seen. Skull: Motion limits evaluation of skull base. As far as seen, no displaced recent fractures are seen in calvarium.  Sinuses/Orbits: There is mucous retention cyst in left maxillary sinus. Mucosal thickening is seen in ethmoid sinus. Other: None. IMPRESSION: No acute intracranial findings are seen in noncontrast CT brain. Atrophy. Small vessel disease. Small old lacunar infarct is seen in right thalamus. Chronic sinusitis. Electronically Signed   By: Ernie Avena M.D.   On: 10/17/2022 15:37    Procedures Procedures    Medications Ordered in ED Medications - No data to display  ED Course/ Medical Decision Making/ A&P Clinical Course as of 10/17/22 1635  Tue Oct 17, 2022  1629 82 yo M with multiple strokes who is bed bound from his deficits. Today had decreased responsiveness. Was AAOx3 per EMS when they arrived. No new neuro deficits. Has lesion in front of R ear that appears to be deep. No acute findings on his head CT. Labs show WBC of 17k. Getting CXR and UA. If these look ok he should be able to go back to his facility.  [RP]    Clinical Course User Index [RP] Rondel Baton, MD                             Medical Decision Making Amount and/or Complexity of Data Reviewed Labs: ordered. Radiology: ordered.   82 year old male history of prior strokes presents today with increased sleepiness.  He is referred responsive when we wake him up.  He concurs that he has increased sleepiness.  He is not febrile here rectal temp was checked.  He does have a leukocytosis of 17,000. Chest x-Tzvi Economou pending Head CT obtained and shows no evidence of acute abnormality, no acute intracranial findings and small old lacunar infarct in right thalamus with chronic sinusitis lethargy with abnormal neuroexam at baseline Differential diagnosis includes hypoglycemia and medications and hypercarbia intracerebral etiology such as bleeding , no focal abnormality to suggest stroke, metabolic abnormalities. Patient has normal blood sugar and no change in medications.  Would consider infection as etiology with  leukocytosis  Discussed with Dr. Catha Nottingham will reevaluate after work up complete and dispo as appropriate  Final Clinical Impression(s) / ED Diagnoses Final diagnoses:  Altered mental status, unspecified altered mental status type    Rx / DC Orders ED Discharge Orders     None         Margarita Grizzle, MD 10/17/22 1635

## 2022-10-18 LAB — CULTURE, BLOOD (ROUTINE X 2)
Culture: NO GROWTH
Culture: NO GROWTH

## 2022-10-19 ENCOUNTER — Other Ambulatory Visit: Payer: Self-pay

## 2022-10-19 ENCOUNTER — Emergency Department (HOSPITAL_COMMUNITY)
Admission: EM | Admit: 2022-10-19 | Discharge: 2022-10-19 | Disposition: A | Discharge status: Home / Self Care | Payer: Medicare Other | Attending: Emergency Medicine | Admitting: Emergency Medicine

## 2022-10-19 ENCOUNTER — Emergency Department (HOSPITAL_COMMUNITY): Payer: Medicare Other

## 2022-10-19 DIAGNOSIS — M542 Cervicalgia: Secondary | ICD-10-CM | POA: Insufficient documentation

## 2022-10-19 DIAGNOSIS — Z8673 Personal history of transient ischemic attack (TIA), and cerebral infarction without residual deficits: Secondary | ICD-10-CM | POA: Insufficient documentation

## 2022-10-19 DIAGNOSIS — Z7902 Long term (current) use of antithrombotics/antiplatelets: Secondary | ICD-10-CM | POA: Insufficient documentation

## 2022-10-19 DIAGNOSIS — M62838 Other muscle spasm: Secondary | ICD-10-CM

## 2022-10-19 LAB — BASIC METABOLIC PANEL
Anion gap: 10 (ref 5–15)
BUN: 16 mg/dL (ref 8–23)
CO2: 24 mmol/L (ref 22–32)
Calcium: 8.7 mg/dL — ABNORMAL LOW (ref 8.9–10.3)
Chloride: 104 mmol/L (ref 98–111)
Creatinine, Ser: 0.72 mg/dL (ref 0.61–1.24)
GFR, Estimated: >60 mL/min (ref >60)
Glucose, Bld: 124 mg/dL — ABNORMAL HIGH (ref 70–99)
Potassium: 3.7 mmol/L (ref 3.5–5.1)
Sodium: 138 mmol/L (ref 135–145)

## 2022-10-19 LAB — CBC WITH DIFFERENTIAL/PLATELET
Abs Immature Granulocytes: 0.03 K/uL (ref 0.00–0.07)
Basophils Absolute: 0 K/uL (ref 0.0–0.1)
Basophils Relative: 0 %
Eosinophils Absolute: 0.1 K/uL (ref 0.0–0.5)
Eosinophils Relative: 2 %
HCT: 40.4 % (ref 39.0–52.0)
Hemoglobin: 13.5 g/dL (ref 13.0–17.0)
Immature Granulocytes: 0 %
Lymphocytes Relative: 14 %
Lymphs Abs: 1 K/uL (ref 0.7–4.0)
MCH: 30.5 pg (ref 26.0–34.0)
MCHC: 33.4 g/dL (ref 30.0–36.0)
MCV: 91.2 fL (ref 80.0–100.0)
Monocytes Absolute: 0.7 K/uL (ref 0.1–1.0)
Monocytes Relative: 9 %
Neutro Abs: 5.2 K/uL (ref 1.7–7.7)
Neutrophils Relative %: 75 %
Platelets: 151 K/uL (ref 150–400)
RBC: 4.43 MIL/uL (ref 4.22–5.81)
RDW: 14.5 % (ref 11.5–15.5)
WBC: 7 K/uL (ref 4.0–10.5)
nRBC: 0 % (ref 0.0–0.2)

## 2022-10-19 LAB — CULTURE, BLOOD (ROUTINE X 2)

## 2022-10-19 MED ORDER — DIAZEPAM 2 MG PO TABS
2.0000 mg | ORAL_TABLET | Freq: Once | ORAL | Status: AC
  Administered 2022-10-19: 2 mg via ORAL
  Filled 2022-10-19: qty 1

## 2022-10-19 NOTE — ED Triage Notes (Signed)
Patient arrived with EMS from [facility] reports persistent pain around his neck onset 6 pm yesterday , denies injury or fall . No fever or chills .

## 2022-10-19 NOTE — ED Notes (Signed)
[facility] called; on their way

## 2022-10-19 NOTE — ED Provider Notes (Signed)
[place] EMERGENCY DEPARTMENT AT [facility] Provider Note   CSN: [id number] Arrival date & time: 10/19/22  4098     History  Chief Complaint  Patient presents with   Neck Pain    Gregory Kerr is a 82 y.o. male history of stroke with spastic left hemiplegia , bed bound presents w/ BL neck pain. Denies injury, new weakness, headache or fever. He was seen in the ED 2 days ago for encephalopathy suspected to be due to CAUTI.    Neck Pain      Home Medications Prior to Admission medications   Medication Sig Start Date End Date Taking? Authorizing Provider  albuterol (VENTOLIN HFA) 108 (90 Base) MCG/ACT inhaler Inhale 1-2 puffs into the lungs every 6 (six) hours as needed for wheezing or shortness of breath. 07/15/22   Prosperi, Christian H, PA-C  amoxicillin-clavulanate (AUGMENTIN) 875-125 MG tablet Take 1 tablet by mouth every 12 (twelve) hours for 7 days. 10/17/22 10/24/22  Rondel Baton, MD  ARTIFICIAL TEAR OP Place 1 drop into both eyes 3 (three) times daily. 01/10/22   [provider]  atorvastatin (LIPITOR) 40 MG tablet Take 1 tablet (40 mg total) by mouth daily. Patient taking differently: Take 40 mg by mouth every evening. 11/22/20   Gardenia Phlegm, MD  baclofen (LIORESAL) 20 MG tablet Take 20 mg by mouth 3 (three) times daily.    [provider]  bisacodyl (DULCOLAX) 10 MG suppository Place 10 mg rectally daily as needed for moderate constipation. 10/18/21   [provider]  calcium-vitamin D (OSCAL WITH D) 500-5 MG-MCG tablet Take 1 tablet by mouth 2 (two) times daily.    [provider]  clopidogrel (PLAVIX) 75 MG tablet Take 1 tablet (75 mg total) by mouth daily. 11/22/20   Gardenia Phlegm, MD  Dextromethorphan-quiNIDine (NUEDEXTA) 20-10 MG capsule Take 1 capsule by mouth every evening. For Pseudobulbar Affect (PBA)    [provider]  gabapentin (NEURONTIN) 100 MG capsule Take 100 mg by mouth 3 (three) times daily.     [provider]  guaiFENesin (MUCINEX) 600 MG 12 hr tablet Take 600 mg by mouth 2 (two) times daily.    [provider]  loperamide (IMODIUM A-D) 2 MG tablet Take 4 mg by mouth 4 (four) times daily as needed for diarrhea or loose stools.    [provider]  loratadine (CLARITIN) 10 MG tablet Take 10 mg by mouth every evening.    [provider]  nitroGLYCERIN (NITROSTAT) 0.4 MG SL tablet Place 0.4 mg under the tongue every 5 (five) minutes as needed for chest pain.    [provider]  nystatin (MYCOSTATIN/NYSTOP) powder Apply 1 application  topically 3 (three) times daily. 1 application TID to suprapubic area, bilateral groin and penis 01/10/22   [provider]  pantoprazole (PROTONIX) 20 MG tablet Take 20 mg by mouth daily. 11/17/20   [provider]  phenazopyridine (PYRIDIUM) 100 MG tablet Take 100 mg by mouth every 8 (eight) hours as needed (bladder spasms).    [provider]  polyethylene glycol (MIRALAX / GLYCOLAX) packet Take 17 g by mouth daily as needed (constipation).     [provider]  Prenatal Vit-Fe Fumarate-FA (PREPLUS) 27-1 MG TABS Take 1 tablet by mouth daily.    [provider]  PROLIA 60 MG/ML SOSY injection Inject 60 mg into the skin every 6 (six) months. 09/07/20   [provider]  saccharomyces boulardii (FLORASTOR) 250  MG capsule Take 250 mg by mouth every 12 (twelve) hours.    [provider]  sertraline (ZOLOFT) 100 MG tablet Take 100 mg by mouth daily.    [provider]  solifenacin (VESICARE) 5 MG tablet Take 1 tablet (5 mg total) by mouth daily. 11/21/20   Gardenia Phlegm, MD  tamsulosin (FLOMAX) 0.4 MG CAPS capsule Take 1 capsule (0.4 mg total) by mouth at bedtime. 11/21/20   Gardenia Phlegm, MD  traMADol (ULTRAM) 50 MG tablet Take 1 tablet (50 mg total) by mouth See admin instructions. Take 50 mg twice daily scheduled, may take a 3rd 50 mg dose as needed for  pain 02/15/22   [name], MD  trimethoprim (TRIMPEX) 100 MG tablet Take 100 mg by mouth daily. **No stop date** per [facility] 02/03/22   [provider]      Allergies    Penicillins    Review of Systems   Review of Systems  Musculoskeletal:  Positive for neck pain.    Physical Exam Updated Vital Signs BP (!) 144/72   Pulse 86   Temp (!) 97.4 F (36.3 C) (Oral)   Resp 16   SpO2 100%  Physical Exam Physical Exam Vitals and nursing note reviewed.  Constitutional:      General: He is not in acute distress.    Appearance: He is  chronically ill-appearing.  HENT:     Head: Normocephalic.     Comments: Dressing in place anterior to right air that when removed reveals wound    Right Ear: External ear normal.     Left Ear: External ear normal.     Nose: Nose normal.     Mouth/Throat:     Pharynx: Oropharynx is clear.  Neck:    Tight, contracted BL neck muscles, rigid, tender to palpation. Unable to move neck. Palpable spasm in most notable in the region of the LS/ [place] BL.  Eyes:     Extraocular Movements: Extraocular movements intact.     Pupils: Pupils are equal, round, and reactive to light.  Cardiovascular:     Rate and Rhythm: Normal rate and regular rhythm.     Pulses: Normal pulses.  Pulmonary:     Comments: Some decreased breath sounds bilateral bases Abdominal:     General: Abdomen is flat. Bowel sounds are normal.     Palpations: Abdomen is soft.  Musculoskeletal:     Cervical back: Normal range of motion.     Comments: Diffuse contractures noted Some early skin breakdown left buttocks Back examined no obvious signs of trauma or infection  Skin:    General: Skin is warm and dry.     Capillary Refill: Capillary refill takes less than 2 seconds.  Neurological:     A&O x 4     Comments: Contractures of left upper extremity and left lower extremity Decreased range of motion weakness in right upper extremity and right lower extremity at baseline  per patient  ED Results / Procedures / Treatments   Labs (all labs ordered are listed, but only abnormal results are displayed) Labs Reviewed - No data to display  EKG None  Radiology DG Chest [facility] 1 View  Result Date: 10/17/2022 CLINICAL DATA:  Weakness. EXAM: PORTABLE CHEST 1 VIEW COMPARISON:  July 15, 2022 FINDINGS: The superior mediastinum is limited in evaluation secondary to positioning of the patient's head and neck. Stable mild to moderate severity cardiac silhouette enlargement is seen. There is marked severity calcification of  the thoracic aorta. Low lung volumes are noted with mild left infrahilar atelectasis. No pleural effusion or pneumothorax is identified. A large, stable hiatal hernia is seen. Radiopaque surgical clips are also seen overlying the right upper quadrant. Multilevel degenerative changes are present throughout the thoracic spine. IMPRESSION: 1. Low lung volumes with mild left infrahilar atelectasis. 2. Large, stable hiatal hernia. Electronically Signed   By: Aram Candela M.D.   On: 10/17/2022 17:53   CT Head Wo Contrast  Result Date: 10/17/2022 CLINICAL DATA:  Altered mental status, dizziness EXAM: CT HEAD WITHOUT CONTRAST TECHNIQUE: Contiguous axial images were obtained from the base of the skull through the vertex without intravenous contrast. RADIATION DOSE REDUCTION: This exam was performed according to the departmental dose-optimization program which includes automated exposure control, adjustment of the mA and/or kV according to patient size and/or use of iterative reconstruction technique. COMPARISON:  02/11/2022 FINDINGS: Brain: No acute intracranial findings are seen. There are no signs of bleeding within the cranium. Old lacunar infarct is seen in right thalamus. There is prominence of third ventricle with no significant change. Cortical sulci are prominent. Corpus callosum appears hypoplastic. There is decreased density in periventricular and  subcortical white matter. Vascular: Scattered arterial calcifications are seen. Skull: Motion limits evaluation of skull base. As far as seen, no displaced recent fractures are seen in calvarium. Sinuses/Orbits: There is mucous retention cyst in left maxillary sinus. Mucosal thickening is seen in ethmoid sinus. Other: None. IMPRESSION: No acute intracranial findings are seen in noncontrast CT brain. Atrophy. Small vessel disease. Small old lacunar infarct is seen in right thalamus. Chronic sinusitis. Electronically Signed   By: Ernie Avena M.D.   On: 10/17/2022 15:37    Procedures Procedures    Medications Ordered in ED Medications - No data to display  ED Course/ Medical Decision Making/ A&P                              This patient presents to the ED with chief complaint(s) of neck pain with pertinent past medical history of cva  and skin cancer which further complicates the presenting complaint. The complaint involves an extensive differential diagnosis and treatment options and also carries with it a high risk of complications and morbidity.    The differential diagnosis includes spasm, arthritis, torticollis, pathologic fracture- I have considered nuchal rigidity due to meningitis, but have low suspicion   The initial plan is to order ct, labs and to treat patient with valium for spasm Medical Decision Making Patient resting in bed.  He is sleeping.  I woke the patient from sleep and he states that he is more comfortable after single dose of oral Valium.  Patient's evaluation is most consistent with neck muscle spasm.  He lives in a skilled nursing facility and I feel he would do best with PT eval and treat of spastic tissue.  He does not appear to have meningitis, acute neck fracture, unstable ligamentous injury. Will discharge with recommendations to his skilled nursing facility.  Discussed return precautions.  Amount and/or Complexity of Data Reviewed Labs: ordered.     Details: I reviewed the patient's labs which show no acute finding Radiology: ordered and independent interpretation performed.    Details: I ordered and interpreted CT neck which shows chronic degenerative changes without acute injury.  Risk Prescription drug management.      Final diagnoses:  None    Rx / DC Orders ED  Discharge Orders     None         Arthor Captain, PA-C 10/19/22 9147    Cathren Laine, MD 10/19/22 1100

## 2022-10-19 NOTE — ED Notes (Signed)
Pt is a&ox4, pwd. Pt complains of neck pain, denies any shob/cp or any other complaints. Pt is attached to vitals. Side rails up x 2, call light in hand, covered with blanket.

## 2022-10-19 NOTE — Discharge Instructions (Signed)
Needs PT EVAL and Treat Neck spasm . Get help right away for new weakness, fever or severe headache

## 2022-10-20 LAB — CULTURE, BLOOD (ROUTINE X 2): Special Requests: ADEQUATE

## 2022-10-21 LAB — BLOOD CULTURE ID PANEL (REFLEXED) - BCID2

## 2022-10-21 LAB — CULTURE, BLOOD (ROUTINE X 2)

## 2022-10-21 NOTE — ED Provider Notes (Signed)
1/4 blood cultures came back positive today for Proteus.  Discussed with ID, he was put on Augmentin for presumed UTI and in September his Proteus was sensitive to penicillins.  The length of course and the antibiotic should be okay as long as he is improving.  His white blood cell count when he was here 2 days ago was back to normal.  Will have nurse call, if he seems to be doing well, finish the antibiotics, if there are any concerns he can return to the ER.  I did briefly discuss with ID, Dr. Earlene Plater, this would be a reasonable plan for him as well.   Pricilla Loveless, MD 10/21/22 [id number]

## 2022-10-21 NOTE — ED Notes (Signed)
Dr. Criss Alvine  request

## 2022-10-21 NOTE — ED Notes (Signed)
Called [facility] spoke with Simeon Craft , report of cultures given , states patient is doing better, per Dr. Criss Alvine as long as patient is better its ok

## 2022-10-21 NOTE — ED Notes (Signed)
Lab called 1 7f 4 blood culture growth gram neg. Rods proteus spec. Results given to Dr. Criss Alvine.

## 2022-10-23 LAB — CULTURE, BLOOD (ROUTINE X 2)

## 2022-10-24 ENCOUNTER — Telehealth (HOSPITAL_BASED_OUTPATIENT_CLINIC_OR_DEPARTMENT_OTHER): Payer: Self-pay | Admitting: *Deleted

## 2022-10-24 ENCOUNTER — Non-Acute Institutional Stay: Payer: Medicare Other | Admitting: Hospice

## 2022-10-24 DIAGNOSIS — Z515 Encounter for palliative care: Secondary | ICD-10-CM

## 2022-10-24 DIAGNOSIS — K5901 Slow transit constipation: Secondary | ICD-10-CM

## 2022-10-24 DIAGNOSIS — G8114 Spastic hemiplegia affecting left nondominant side: Secondary | ICD-10-CM

## 2022-10-24 DIAGNOSIS — F329 Major depressive disorder, single episode, unspecified: Secondary | ICD-10-CM

## 2022-10-24 NOTE — Telephone Encounter (Signed)
Post ED Visit - Positive Culture Follow-up  Culture report reviewed by antimicrobial stewardship pharmacist: [facility] [x]  Andreas Ohm, Pharm.D. []  Celedonio Miyamoto, Pharm.D., BCPS AQ-ID []  Garvin Fila, Pharm.D., BCPS []  Georgina Pillion, [address].D., BCPS []  [place], [place].D., BCPS, AAHIVP []  Estella Husk, Pharm.D., BCPS, AAHIVP []  Lysle Pearl, PharmD, BCPS []  Phillips Climes, PharmD, BCPS []  Agapito Games, PharmD, BCPS []  Verlan Friends, PharmD []  Mervyn Gay, PharmD, BCPS []  Vinnie Level, PharmD  [facility] []  Len Childs, PharmD []  Greer Pickerel, PharmD []  Adalberto Cole, PharmD []  Perlie Gold, Rph []  Lonell Face) Jean Rosenthal, PharmD []  Earl Many, PharmD []  Junita Push, PharmD []  Dorna Leitz, PharmD []  Terrilee Files, PharmD []  Lynann Beaver, PharmD []  Keturah Barre, PharmD []  Loralee Pacas, PharmD []  Bernadene Person, PharmD   Positive Blood culture Treated with Amoxicillin-pot Clavulanate 875-125 mg, organism sensitive to the same and no further patient follow-up is required at this time.  Nena Polio Garner Nash 10/24/2022, 10:30 AM

## 2022-10-24 NOTE — Progress Notes (Signed)
Therapist, nutritional Palliative Care Consult Note Telephone: [phone]  Fax: [phone]  PATIENT NAME: Gregory Kerr [address] [place] [address] [phone] (home) [phone] (work) DOB: Jan 15, 1941 MRN: [id number]  PRIMARY CARE PROVIDER:    Bernette Redbird, MD,  [address] [place] [address] [phone]  REFERRING PROVIDER:   Irven Baltimore NP  RESPONSIBLE PARTY:    Contact Information     Name Relation Home Work Mobile   [name],[name] Sister [phone]  [phone]        I met face to face with patient in the facility. Visit to build trust and highlight Palliative Medicine as specialized medical care for people living with serious illness, aimed at facilitating better quality of life through symptoms relief, assisting with advance care planning and complex medical decision making.  NP called [facility] and left a voicemail with callback number  Palliative care team will continue to support patient, patient's family, and medical team.  ASSESSMENT AND / RECOMMENDATIONS:  ------------------------------------------------------------------------------------------------------------------------------------------------- Advance Care Planning: Our advance care planning conversation included a discussion about:    The value and importance of advance care planning  Difference between Hospice and Palliative care Exploration of goals of care in the event of a sudden injury or illness  Identification and preparation of a healthcare agent  Review and updating or creation of an  advance directive document . Decision not to resuscitate or to de-escalate disease focused treatments due to poor prognosis.  CODE STATUS: Patient is a full code  Goals of Care: Goals include to maximize quality of life and symptom management  I spent  20 minutes providing this initial consultation. More than 50% of the time in this consultation  was spent on counseling patient and coordinating communication. --------------------------------------------------------------------------------------------------------------------------------------  Symptom Management/Plan: Left spastic hemiplegia: PT/OT as needed.  Continue baclofen, Tylenol and gabapentin for pain/spasm.  Patient has hydrocodone as needed severe pain. Constipation: Continue MiraLAX as ordered.  Depression: Managed with sertraline.  Encourage socialization and participation in facility activities.  Psych consult as needed. Routine CBC CMP  Follow up: Palliative care will continue to follow for complex medical decision making, advance care planning, and clarification of goals. Return 6 weeks or prn. Encouraged to call provider sooner with any concerns.   Family /Caregiver/Community Supports: Patient in SNF for ongoing care  HOSPICE ELIGIBILITY/DIAGNOSIS: TBD  Chief Complaint: Initial Palliative care visit  HISTORY OF PRESENT ILLNESS:  AASIM Kerr is a 82 y.o. 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  Recent ED visit in May 2024 for altered mental status and neck muscle spasm - improved not admitted.  Altered mental status resolved after treatment with Augmentin for presumed UTI.  Questionable lesion/wound to left ear area, followed by dermatologist.  Appointment with dermatologist for possible resection.  Patient in no distress today, endorsed weakness, denies pain/discomfort.  Wound consultant was with patient for care to lesion in left ear area.   History obtained from review of EMR, discussion with primary team, caregiver, family and/or Mr. Corlett.  Review and summarization of Epic records shows history from other than patient. Rest of 10 point ROS asked and  negative. Independent interpretation of tests and reviewed as needed, available labs, patient records, imaging, studies and related documents from the EMR.  PHYSICAL EXAM: GEN: in no acute distress  Cardiac:  S1 S2, no LE edema feet/ankles  Respiratory:  clear to auscultation bilaterally, GI: soft, nontender,  + BS   MS: left side hemiplegia, left hand contracture Skin: warm and dry, no rash to visible skin Neuro: Generalized weakness, nonfocal, alert and oriented x 3 Psych: non-anxious affect  Recent Labs  Lab 10/17/22 1457 10/17/22 1507 10/19/22 0733  WBC 17.4*  --  7.0  HGB 14.4   < > 13.5  HCT 43.3   < > 40.4  PLT 174  --  151  MCV 90.4  --  91.2   < > = values in this interval not displayed.   Recent Labs  Lab 10/17/22 1457 10/17/22 1507 10/17/22 1518 10/19/22 0733  NA 138 143   < > 138  K 3.5 3.6   < > 3.7  CL 107 106  --  104  CO2 23  --   --  24  BUN 18 21  --  16  CREATININE 0.90 0.70  --  0.72  GLUCOSE 108* 107*  --  124*   < > = values in this interval not displayed.   Latest GFR by Cockcroft Gault (not valid in AKI or ESRD) Estimated Creatinine Clearance: 73.7 mL/min (by C-G formula based on SCr of 0.72 mg/dL). 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   Other and unspecified hyperlipidemia 10/24/2012   Hiatal hernia 10/24/2012   Chronic pain syndrome 01/30/2013   IBS (irritable bowel syndrome) 01/30/2013   GERD (gastroesophageal reflux disease) 08/11/2013   Disuse osteoporosis 08/11/2013   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   Dermatophytosis of nail 12/18/2013   Unspecified essential hypertension 12/18/2013   Generalized osteoarthrosis, unspecified site 12/18/2013   Chronic airway  obstruction, not elsewhere classified 01/15/2014   Essential hypertension 04/03/2014   UTI (urinary tract infection) 04/04/2014   Gastroesophageal reflux disease without esophagitis 06/18/2014   Essential hypertension, benign 06/18/2014   Primary generalized hypertrophic osteoarthrosis 06/18/2014   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   Vitamin D deficiency 08/03/2014   Hyperlipidemia LDL goal <100 08/03/2014   CVA, old, hemiparesis (HCC) 08/03/2014   Leg edema, left 08/03/2014   Hiatal hernia with GERD 08/31/2014   Pancreatitis, gallstone 09/22/2014   Chronic diastolic heart failure (HCC) 09/22/2014   Acute cholangitis 09/22/2014   Gallstone pancreatitis 09/22/2014   Hypokalemia 09/23/2014   Acute on chronic diastolic CHF (congestive heart failure) (HCC) 09/27/2014   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) 12/10/2014   Angina pectoris (HCC) 12/10/2014   Chronic obstructive pulmonary disease (HCC) 12/10/2014   Irritable bowel syndrome with diarrhea 12/10/2014   Senile osteoporosis 12/10/2014   CVA (cerebral vascular accident) (HCC) 12/10/2014   Hyperlipidemia 07/16/2015   History of CVA with residual deficit 10/14/2015   Coronary artery disease involving native coronary artery of native heart with angina pectoris (HCC) 11/12/2015   D (diarrhea) 12/29/2015   Major depression, chronic 02/16/2016   Neuropathic pain 02/16/2016   Chest pain 02/27/2016   Thoracic ascending aortic aneurysm (HCC) 02/27/2016   Leukocytosis 02/27/2016   Allergic rhinitis due to allergen 04/27/2016   Acute CVA (cerebrovascular accident) (HCC) 11/19/2020   Generalized weakness    Basal cell carcinoma (BCC) of skin of  face 03/08/2021   Sepsis (HCC) 06/28/2021   Spasticity 06/28/2021   Prolonged QT interval 06/28/2021   Constipation 06/28/2021   Acute renal failure superimposed on stage 3a chronic kidney disease (HCC) 06/29/2021   Bladder spasm 06/29/2021   Bacteremia due to  Proteus species    SIRS (systemic inflammatory response syndrome) (HCC) 02/11/2022   Resolved Ambulatory Problems    Diagnosis Date Noted   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) 03/20/2013   Chest pain 04/03/2014   Acute respiratory failure with hypoxia (HCC) 04/04/2014   Past Medical History:  Diagnosis Date   Hernia    Osteoporosis, unspecified 10/24/2012   Pneumonia    Retinal detachment    Stroke (HCC) 2007    SOCIAL HX:  Social History   Tobacco Use   Smoking status: Former    Packs/day: 1    Types: Cigarettes    Quit date: 12/20/2005    Years since quitting: 16.8   Smokeless tobacco: Never  Substance Use Topics   Alcohol use: Not Currently    Comment: 09/22/2014 "stopped in 2007; drank occasionally when I did drink"     FAMILY HX:  Family History  Problem Relation Age of Onset   Heart attack Mother    Heart attack Father    Cancer Maternal Aunt        Stomach   Cancer Paternal Aunt        Uterine,Colon      ALLERGIES:  Allergies  Allergen Reactions   Penicillins Rash    ++++Patient in nursing home. Unable to clarify details/severity of the reaction.++++      PERTINENT MEDICATIONS:  Outpatient Encounter Medications as of 10/24/2022  Medication Sig   albuterol (VENTOLIN HFA) 108 (90 Base) MCG/ACT inhaler Inhale 1-2 puffs into the lungs every 6 (six) hours as needed for wheezing or shortness of breath.   amoxicillin-clavulanate (AUGMENTIN) 875-125 MG tablet Take 1 tablet by mouth every 12 (twelve) hours for 7 days.   ARTIFICIAL TEAR OP Place 1 drop into both eyes 3 (three) times daily.   atorvastatin (LIPITOR) 40 MG tablet Take 1 tablet (40 mg total) by mouth daily. (Patient taking differently: Take 40 mg by mouth every evening.)   baclofen (LIORESAL) 20 MG tablet Take 20 mg by mouth 3 (three) times daily.   bisacodyl (DULCOLAX) 10 MG suppository Place 10 mg rectally daily as needed for moderate constipation.   calcium-vitamin D  (OSCAL WITH D) 500-5 MG-MCG tablet Take 1 tablet by mouth 2 (two) times daily.   clopidogrel (PLAVIX) 75 MG tablet Take 1 tablet (75 mg total) by mouth daily.   Dextromethorphan-quiNIDine (NUEDEXTA) 20-10 MG capsule Take 1 capsule by mouth every evening. For Pseudobulbar Affect (PBA)   gabapentin (NEURONTIN) 100 MG capsule Take 100 mg by mouth 3 (three) times daily.   guaiFENesin (MUCINEX) 600 MG 12 hr tablet Take 600 mg by mouth 2 (two) times daily.   loperamide (IMODIUM A-D) 2 MG tablet Take 4 mg by mouth 4 (four) times daily as needed for diarrhea or loose stools.   loratadine (CLARITIN) 10 MG tablet Take 10 mg by mouth every evening.   nitroGLYCERIN (NITROSTAT) 0.4 MG SL tablet Place 0.4 mg under the tongue every 5 (five) minutes as needed for chest pain.   nystatin (MYCOSTATIN/NYSTOP) powder Apply 1 application  topically 3 (three) times daily. 1 application TID to suprapubic area, bilateral groin and penis   pantoprazole (PROTONIX) 20 MG tablet Take 20 mg by mouth  daily.   phenazopyridine (PYRIDIUM) 100 MG tablet Take 100 mg by mouth every 8 (eight) hours as needed (bladder spasms).   polyethylene glycol (MIRALAX / GLYCOLAX) packet Take 17 g by mouth daily as needed (constipation).    Prenatal Vit-Fe Fumarate-FA (PREPLUS) 27-1 MG TABS Take 1 tablet by mouth daily.   PROLIA 60 MG/ML SOSY injection Inject 60 mg into the skin every 6 (six) months.   saccharomyces boulardii (FLORASTOR) 250 MG capsule Take 250 mg by mouth every 12 (twelve) hours.   sertraline (ZOLOFT) 100 MG tablet Take 100 mg by mouth daily.   solifenacin (VESICARE) 5 MG tablet Take 1 tablet (5 mg total) by mouth daily.   tamsulosin (FLOMAX) 0.4 MG CAPS capsule Take 1 capsule (0.4 mg total) by mouth at bedtime.   traMADol (ULTRAM) 50 MG tablet Take 1 tablet (50 mg total) by mouth See admin instructions. Take 50 mg twice daily scheduled, may take a 3rd 50 mg dose as needed for pain   trimethoprim (TRIMPEX) 100 MG tablet Take 100  mg by mouth daily. **No stop date** per [facility]   No facility-administered encounter medications on file as of 10/24/2022.     Thank you for the opportunity to participate in the care of Mr. Salama.  The palliative care team will continue to follow. Please call our office at [phone] if we can be of additional assistance.   Note: Portions of this note were generated with Scientist, clinical (histocompatibility and immunogenetics). Dictation errors may occur despite best attempts at proofreading.  Rosaura Carpenter, NP

## 2022-12-02 NOTE — Discharge Summary (Signed)
 ------------------------------------------------------------------------------- Attestation signed by Fonda Laraine Muscat, MD at 12/02/2022  6:18 PM    I reviewed the patient's status and agree with the plan of care as documented by the resident.   Fonda JONETTA Muscat, MD   -------------------------------------------------------------------------------  Otolaryngology-Head and Neck Surgery Discharge Summary  Patient ID: Gregory Kerr [id number] 81 y.o. 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 *  Procedures/Surgeries performed during hospitalization:  PAROTIDECTOMY (Right: Neck) WIDE LOCAL EXCISION OF CHEEK , partial auriculectomy (Right: Neck)  Indication for Admission: Post-operative observation   Hospital Course:  Gregory Kerr was taken to the operating room on 12/01/2022 where he underwent the above mentioned procedure(s) without complication. He did well postoperatively and was monitored in a regular floor bed after being observed for a short while in PACU and deemed stable for transfer.  He remained afebrile and hemodynamically stable throughout hospitalization. He was transitioned to a regular diet which was well tolerated without nausea or vomiting.  Pain control was achieved on PO pain medications. He continued to have a normal UOP with normal vital signs for this patient. Foley was removed on POD1 and he voided without difficulty.  He was alert and oriented throughout hospitalization.    At the time of discharge, the patient was afebrile, in no acute distress and vital signs were stable. New discharge medications were discussed in detail and the patient stated understanding of use and administration. The patient verbalized understanding of all discharge instructions and therefore was released.  Consults: none  Significant Diagnostic Studies:   As above  Treatments: PAROTIDECTOMY (Right: Neck) WIDE LOCAL EXCISION OF CHEEK , partial auriculectomy (Right: Neck)  Current Facility-Administered Medications:    acetaminophen  (TYLENOL ) tablet 1,000 mg, 1,000 mg, oral, Q6H, Eva Marsa Hurst, MD, 1,000 mg at 12/02/22 1130   albuterol  sulfate 2.5 mg/0.5 mL nebulizer solution 2.5 mg, 2.5 mg, nebulization, Q4H PRN, Eva Marsa Hurst, MD   amino acids-protein hydrolysate [facility]) packet 1 packet, 30 mL, oral, BID, Eva Marsa Hurst, MD, 1 packet at 12/02/22 0900   atorvastatin  (LIPITOR) tablet 40 mg, 40 mg, oral, Daily, Eva Marsa Hurst, MD, 40 mg at 12/02/22 0843   bacitracin ointment tube 1 Application, 1 Application, topical, BID, Eva Marsa Hurst, MD, 1 Application at 12/02/22 [phone]   baclofen  (LIORESAL ) tablet 20 mg, 20 mg, oral, TID, Justin Alexander Hall, MD, 20 mg at 12/02/22 9362   BisaCODYL  (DULCOLAX) suppository 10 mg, 10 mg, rectal, Daily PRN, Eva Marsa Hurst, MD   dextrose  (GLUTOSE) 40 % oral gel 15 g, 15 g, oral, PRN, Eva Marsa Hurst, MD   dextrose  injection 12.5 g, 12.5 g, intravenous, PRN, Eva Marsa Hurst, MD   gabapentin  (NEURONTIN ) capsule 200 mg, 200 mg, oral, TID, Eva Marsa Hurst, MD, 200 mg at 12/02/22 9362   guaiFENesin  (MUCINEX ) 12 hr tablet 600 mg, 600 mg, oral, BID, Eva Marsa Hurst, MD, 600 mg at 12/02/22 0843   heparin  (porcine) 5,000 unit/mL injection 5,000 Units, 5,000 Units, subcutaneous, Q8H SCH, Eva Marsa Hurst, MD, 5,000 Units at 12/02/22 [phone]   Lactobac. rhamnosus GG  (CULTURELLE)  sprinkle capsule 1 capsule, 1 capsule, oral, Daily, Eva Marsa Hurst, MD, 1 capsule at 12/02/22 0841   lidocaine  (SALONPAS) 4 % 1 patch, 1 patch, topical, Daily, Eva Marsa Hurst, MD   miconazole (MICATIN) 2 % topical powder 1 Application, 1 Application, topical, BID, Eva Marsa Hurst, MD, 1 Application at 12/02/22 0851   omeprazole  (PriLOSEC) DR capsule 20 mg, 20 mg, oral, Daily 0600, Justin Alexander Hall, MD, 20 mg at 12/02/22 9362   ondansetron  (ZOFRAN ) injection 4 mg, 4 mg, intravenous, Q6H PRN, Eva Marsa Hurst, MD   oxyCODONE  (ROXICODONE ) immediate release tablet 5 mg, 5 mg, oral, Q4H PRN, Eva Marsa Hurst, MD, 5 mg at 12/02/22 9156   phenazopyridine  (PYRIDIUM ) tablet 100 mg, 100 mg, oral, Q8H PRN, Justin Alexander Hall, MD   polyethylene glycol (GLYCOLAX ) packet 17 g, 17 g, oral, Daily PRN, Eva Marsa Hurst, MD   sertraline  (ZOLOFT ) tablet 100 mg, 100 mg, oral, Daily, Eva Marsa Hurst, MD, 100 mg at 12/02/22 9157   sertraline  (ZOLOFT ) tablet 25 mg, 25 mg, oral, Daily, Eva Marsa Hurst, MD, 25 mg at 12/02/22 0851   tamsulosin  (FLOMAX ) 24 hr capsule 0.4 mg, 0.4 mg, oral, At Bedtime, Eva Marsa Hurst, MD, 0.4 mg at 12/01/22 2053  Medications Discontinued During This Encounter  Medication Reason   sterile water  irrigation solution Patient Discharge   sodium chloride  (irrigation) (NS) 0.9 % irrigation solution Patient Discharge     Discharge Exam: AAO, NAD  Normal work of breathing   Extremities warm, well perfused.   Abdomen soft, non-distended appropriately non tender  Right face s/p excision with bolster in place  Ambrus pack in place right ear  Moving all extremities equally, no gross deficits.  Disposition: SNF  Discharge Medications and Instructions:  Discharge Medications:   Medication List     CONTINUE taking these medications    acetaminophen  650 mg ER tablet Commonly known as: TYLENOL  Take 650 mg  by mouth 2 (two) times a day.   albuterol  HFA 90 mcg/actuation inhaler Commonly known as: PROVENTIL  HFA;VENTOLIN  HFA;PROAIR  HFA Inhale 1-2 puffs every 6 (six) hours as needed for wheezing or shortness of breath.   Artificial Tears (cmc) 1 % Drop Generic drug: carboxymethylcellulose sodium Administer 1 drop into affected eye(s) 3 (three) times a day.   atorvastatin  40 mg tablet Commonly known as: LIPITOR Take 40 mg by mouth daily.   baclofen  20 mg tablet Commonly known as: LIORESAL  Take 20 mg by mouth 3 (three) times a day.   Biofreeze (menthol ) 4 % Gel Generic drug: menthol  Apply 1 Application topically 3 (three) times a day.   BisaCODYL  10 mg suppository Commonly known as: DULCOLAX Insert 10 mg into the rectum daily as needed (moderate constipation).   calcium  carbonate-vitamin D3 500 mg (200 mg Ca)-5 mcg (200 units Vit D) per tablet Take 1 tablet by mouth 2 (two) times a day.   clopidogreL  75 mg tablet Commonly known as: PLAVIX  Take 75 mg by mouth daily.   cranberry 450 mg Tab tablet Generic drug: cranberry fruit Take 450 mg by mouth daily.   Florastor 250 mg capsule Generic drug: saccharomyces boulardii Take 250 mg by mouth 2 (two) times a day.   gabapentin  100 mg capsule Commonly known as: NEURONTIN  Take 200 mg by mouth 3 (three) times a day.   guaiFENesin  600 mg 12 hr tablet Commonly known as: MUCINEX  Take 600 mg by mouth 2 (two) times a day.   HYDROcodone -acetaminophen  5-325 mg per tablet Commonly known as: NORCO  Take 1 tablet by mouth 2 (two) times a day.   lidocaine  4 % patch Commonly known as: SALONPAS Apply 1 patch to lower back . Apply in am and remove after 12 hours   loperamide 2 mg tablet Commonly known as: IMODIUM A-D Take 4 mg by mouth 4 (four) times a day as needed for diarrhea.   loratadine  10 mg tablet Commonly known as: CLARITIN  Take 10 mg by mouth at bedtime.   nitroglycerin  0.4 mg SL tablet Commonly known as: NITROSTAT  Place 0.4  mg under the tongue every 5 (five) minutes as needed for chest pain (up to 3 doses).   Nuedexta  20-10 mg per capsule Generic drug: dextromethorphan -quiNIDine Take 1 capsule by mouth at bedtime.   pantoprazole  20 mg EC tablet Commonly known as: PROTONIX  Take 40 mg by mouth daily.   phenazopyridine  100 mg tablet Commonly known as: PYRIDIUM  Take 100 mg by mouth every 8 (eight) hours as needed for bladder spasms.   polyethylene glycol 17 gram packet Commonly known as: GLYCOLAX  Take 17 g by mouth daily as needed for constipation.   PrePlus 27 mg iron- 1 mg Tab Generic drug: prenatal vitamins plus Take 1 tablet by mouth daily.   Pro-Stat AWC 17-100 gram-kcal/30 mL Lipk packet Generic drug: amino acids-protein hydolysate Take 30 mL by mouth 2 (two) times a day.   * sertraline  25 mg tablet Commonly known as: ZOLOFT  Take 25 mg by mouth daily. 125 mg daily total   * sertraline  100 mg tablet Commonly known as: ZOLOFT  Take 100 mg by mouth daily. 125 mg daily total   tamsulosin  0.4 mg Cap Commonly known as: FLOMAX  Take 0.4 mg by mouth at bedtime.   trimethoprim  100 mg tablet Commonly known as: TRIMPEX  Take 100 mg by mouth at bedtime.      * * This list has 2 medication(s) that are the same as other medications prescribed for you. Read the directions carefully, and ask your doctor or other care provider to review them with you.           See AVS for patient instructions.    Scheduled Future Appointments       Provider Department Dept Phone Center   12/15/2022 10:20 AM [facility] OUTPATIENT IMAGING [facility] [phone] [facility] Out[facility]       The patient was instructed to call if he develops a fever greater than 101.5, any drainage from the incision (when applicable), redness extending from his incision, unable to void, or any other questions or concerns.  Electronically signed by: Johana Maryelizabeth Cable, MD 12/02/2022 1:10 PM Otolaryngology    *Some images could not be shown.

## 2023-09-27 IMAGING — CT CT IMAGE GUIDED DRAINAGE BY PERCUTANEOUS CATHETER
1 of 4 series · 12 of 32 positions shown, 18 images · non-contrast
Comparison: CT abdomen and pelvis-09/22/2014

INDICATION: Urinary retention. Please perform image guided placement of
suprapubic catheter for chronic urinary diversion purposes.

EXAM:
ULTRASOUND AND CT GUIDED SUPRAPUBIC CATHETER PLACEMENT

[Series 2: i-spiral 5.0 b40f · axial · 0.97mm/px · z∈[+785,+1065]mm · 12 of 96 slices shown, 18 images]
[im 8/96  soft-tissue]
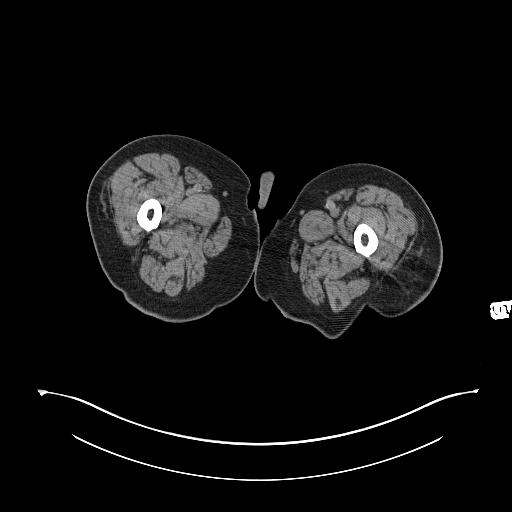
[im 8/96  bone]
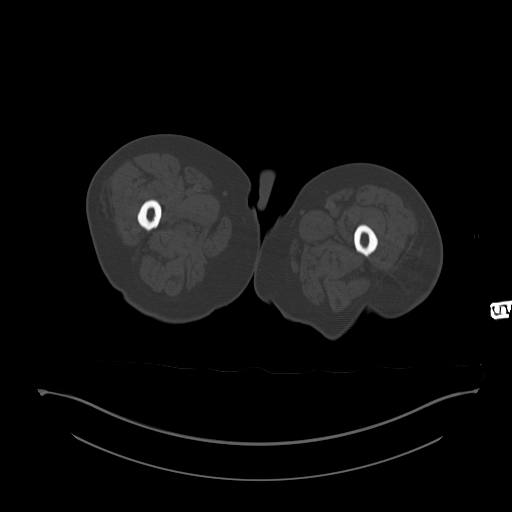
[im 15/96  soft-tissue]
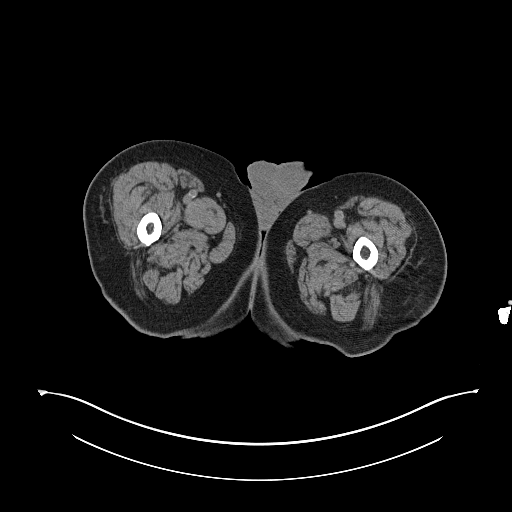
[im 22/96  soft-tissue]
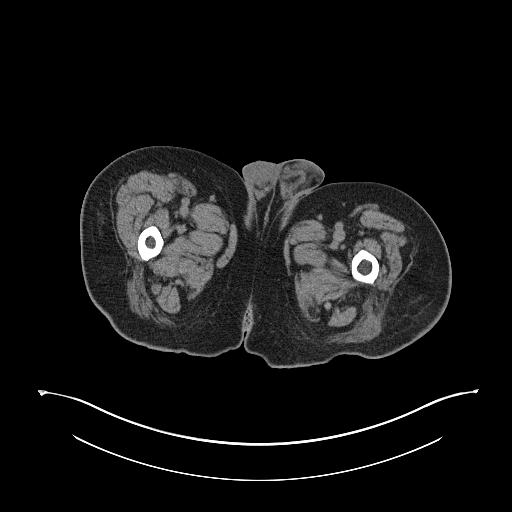
[im 30/96  soft-tissue]
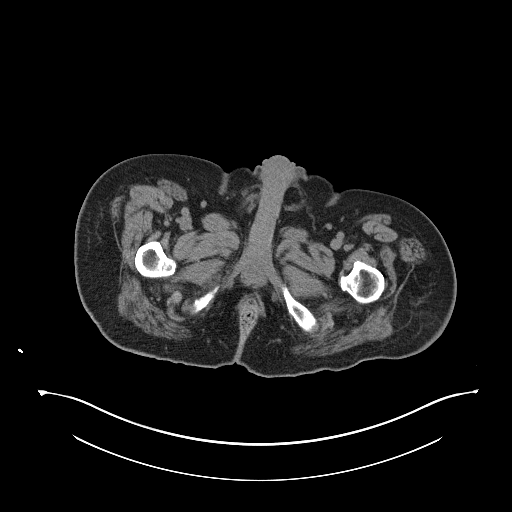
[im 37/96  soft-tissue]
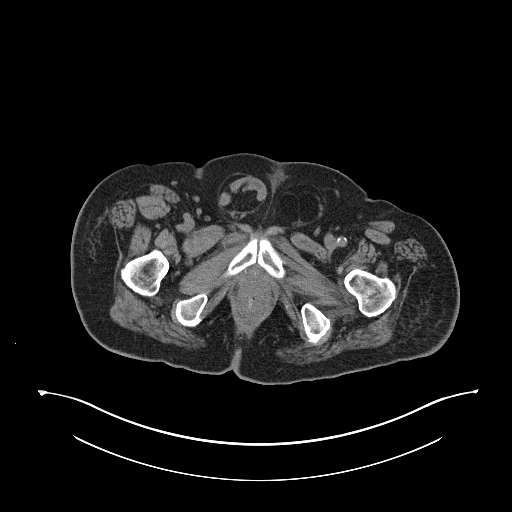
[im 44/96  soft-tissue]
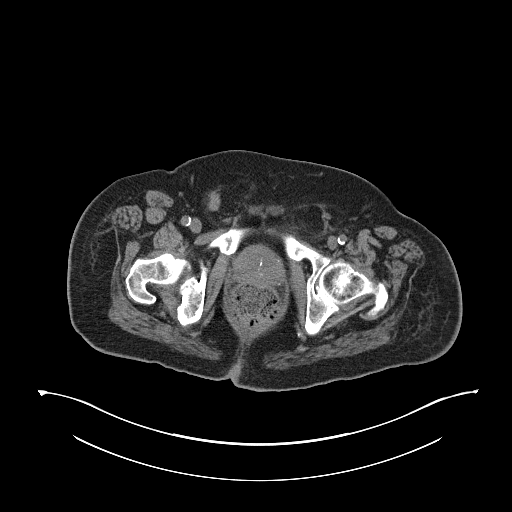
[im 52/96  soft-tissue]
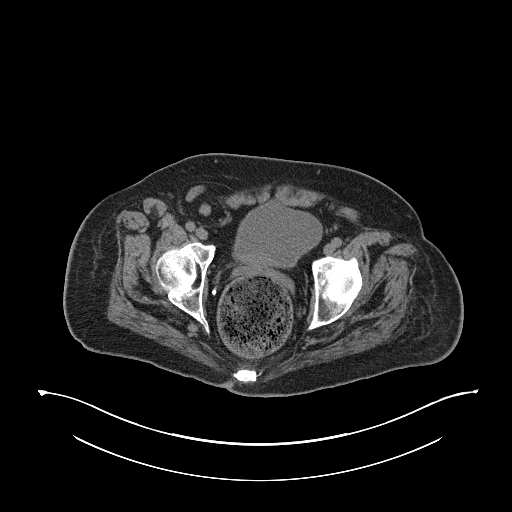
[im 59/96  soft-tissue]
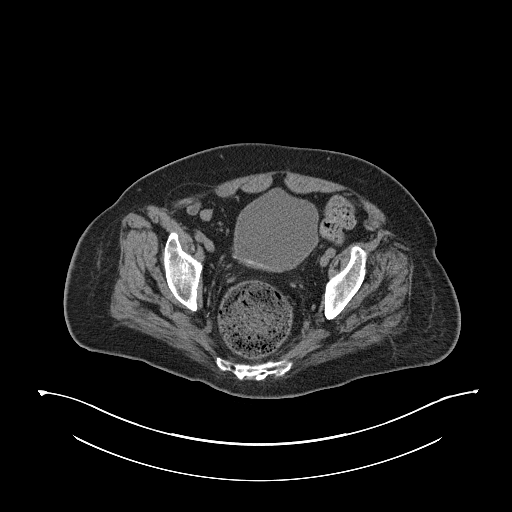
[im 66/96  soft-tissue]
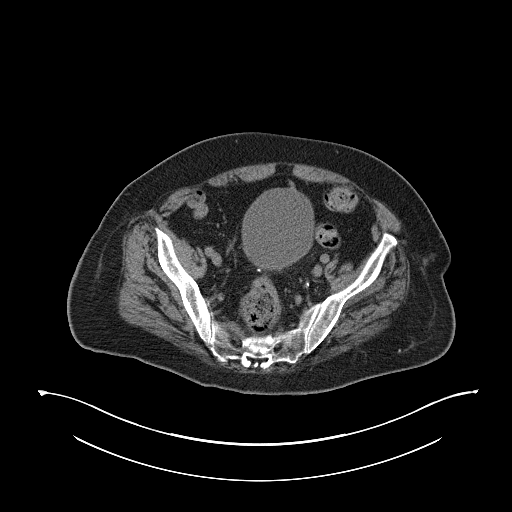
[im 66/96  lung]
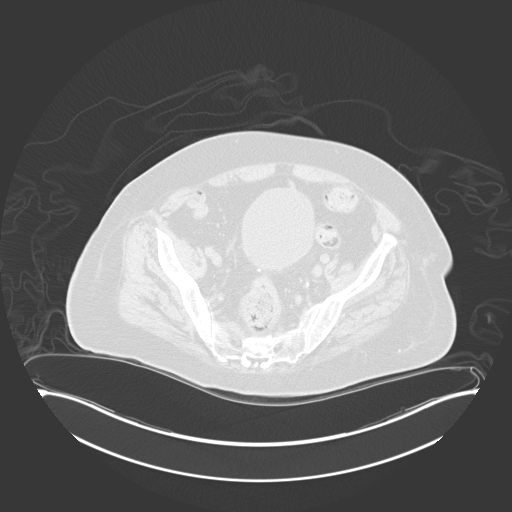
[im 66/96  bone]
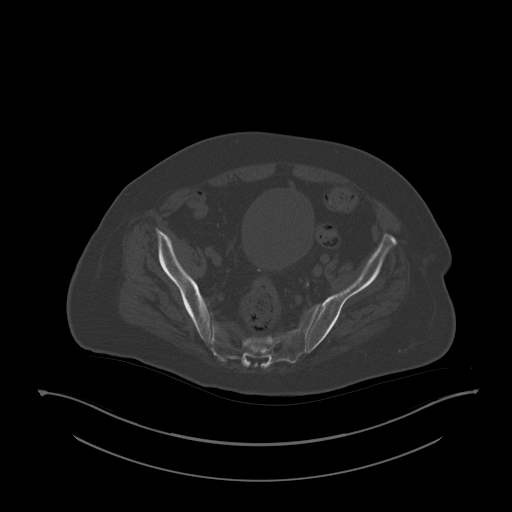
[im 74/96  soft-tissue]
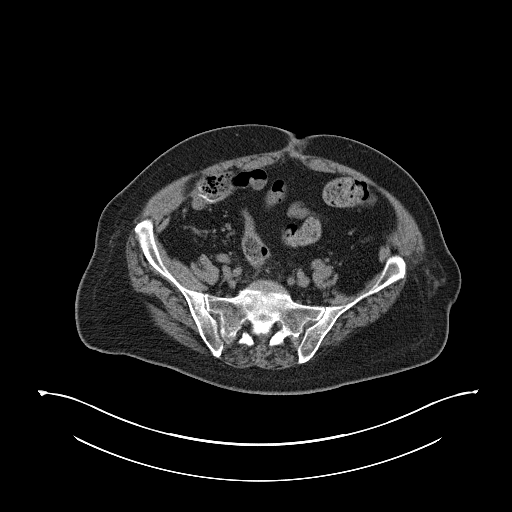
[im 74/96  lung]
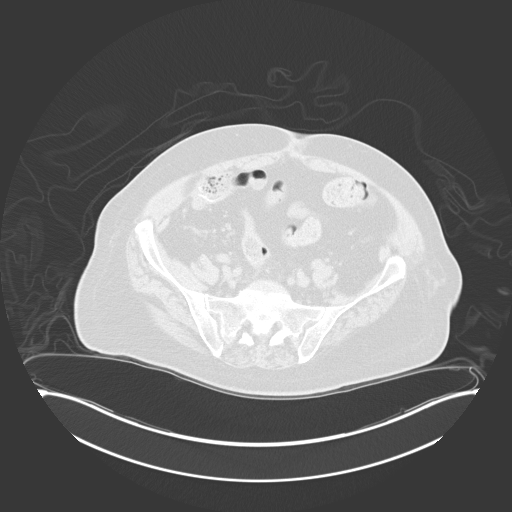
[im 81/96  soft-tissue]
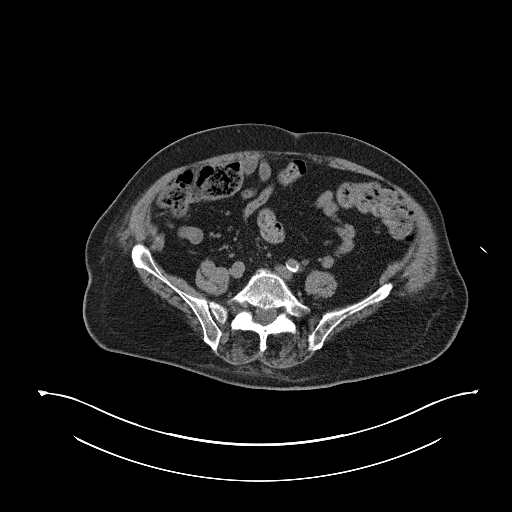
[im 81/96  lung]
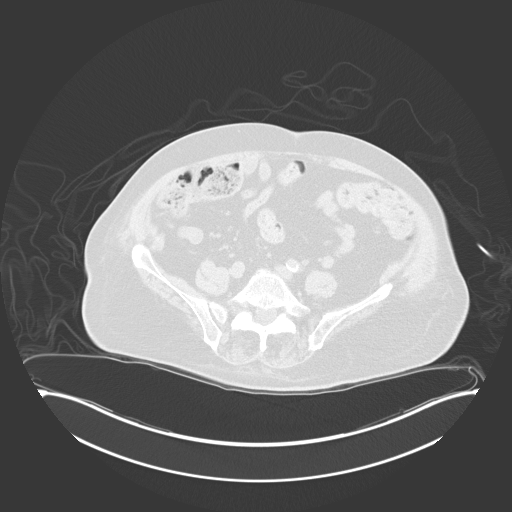
[im 88/96  soft-tissue]
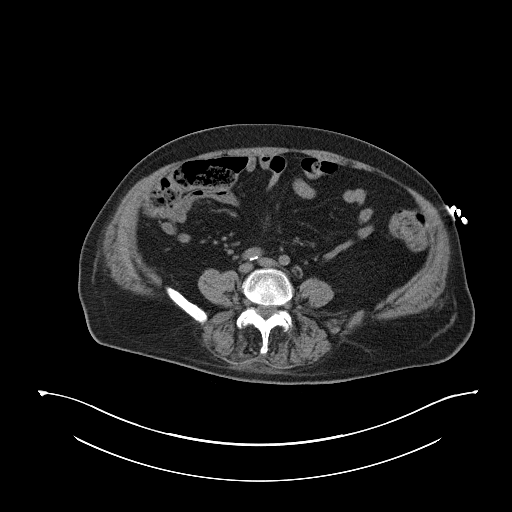
[im 88/96  lung]
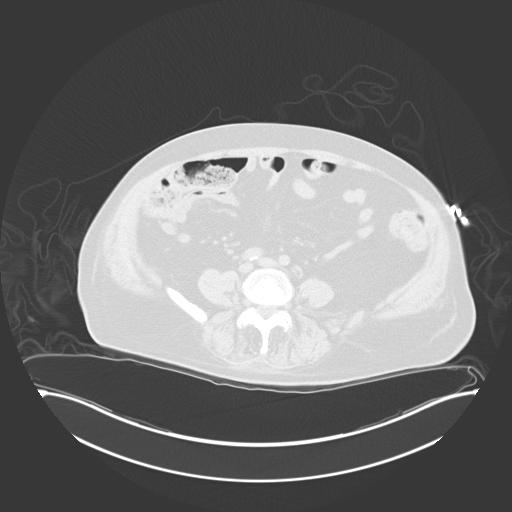

[12 of 32 positions shown; findings below may reference images not displayed]

MEDICATIONS:
None

ANESTHESIA/SEDATION:
Moderate (conscious) sedation was employed during this procedure as
administered by the [REDACTED].

A total of Versed 1.5 mg and Fentanyl 75 mcg was administered
intravenously.

Moderate Sedation Time: 17 minutes. The patient's level of
consciousness and vital signs were monitored continuously by
radiology nursing throughout the procedure under my direct
supervision.

CONTRAST:  None

COMPLICATIONS:
None immediate.

PROCEDURE:
The procedure, risks, benefits, and alternatives were explained to
the patient. Questions regarding the procedure were encouraged and
answered. The patient understands and consents to the procedure. A
timeout was performed prior to the initiation of the procedure.

The patient was positioned supine on the CT gantry. Preprocedural
imaging was obtained of the lower pelvis. The skin overlying the
anterior aspect the lower pelvis was prepped and draped in usual
sterile fashion.

Under direct ultrasound guidance, a 22 gauge needle was utilized for
the purposes of procedural planning after the overlying soft tissues
were anesthetized with 1% lidocaine. Next, an 18 gauge trocar needle
was advanced into the urinary bladder under direct ultrasound
guidance. Ultrasound image was saved for procedural documentation
purposes. A short Amplatz wire was coiled within the urinary
bladder. Appropriate position was confirmed with a limited pelvic
CT.

The track was serially dilated ultimately allowing placement of a 14
French all-purpose drainage catheter with end coiled and locked
within the urinary bladder.

The catheter was connected to a gravity bag. The exit site of the
catheter was secured within interrupted suture. A dressing was
placed. The patient tolerated the procedure well without immediate
postprocedural complication.
FINDINGS: There is no bowel interposed between the anterior aspect of the
urinary bladder and the ventral lower abdominal/pelvic wall, though
note is again made of a right sided indirect inguinal hernia
containing a nondilated loop of small bowel as well as a mesenteric
fat containing left-sided direct inguinal hernia. Minimal amount of
radiopaque debris is seen lying dependently within the urinary
bladder.

Ultrasound and CT was utilized for the placement of a 14 French
percutaneous drainage catheter with end coiled and locked within the
urinary bladder.
IMPRESSION: Technically successful ultrasound and CT-guided placement of a 14
French suprapubic catheter.

PLAN:
Recommend fluoroscopic guided exchange and up sizing of this
suprapubic drainage catheter in approximately 6-8 weeks.

## 2024-05-22 ENCOUNTER — Inpatient Hospital Stay (HOSPITAL_COMMUNITY)
Admission: EM | Admit: 2024-05-22 | Discharge: 2024-05-28 | DRG: 854 | Disposition: A | Discharge status: Skilled Nursing Facility | Source: Skilled Nursing Facility | Attending: Internal Medicine | Admitting: Internal Medicine

## 2024-05-22 ENCOUNTER — Emergency Department (HOSPITAL_COMMUNITY)

## 2024-05-22 ENCOUNTER — Other Ambulatory Visit: Payer: Self-pay

## 2024-05-22 ENCOUNTER — Encounter (HOSPITAL_COMMUNITY): Payer: Self-pay | Admitting: *Deleted

## 2024-05-22 DIAGNOSIS — R7881 Bacteremia: Secondary | ICD-10-CM | POA: Diagnosis present

## 2024-05-22 DIAGNOSIS — K3189 Other diseases of stomach and duodenum: Principal | ICD-10-CM | POA: Diagnosis present

## 2024-05-22 DIAGNOSIS — Z9049 Acquired absence of other specified parts of digestive tract: Secondary | ICD-10-CM

## 2024-05-22 DIAGNOSIS — I69354 Hemiplegia and hemiparesis following cerebral infarction affecting left non-dominant side: Secondary | ICD-10-CM

## 2024-05-22 DIAGNOSIS — N400 Enlarged prostate without lower urinary tract symptoms: Secondary | ICD-10-CM | POA: Diagnosis present

## 2024-05-22 DIAGNOSIS — I712 Thoracic aortic aneurysm, without rupture, unspecified: Secondary | ICD-10-CM

## 2024-05-22 DIAGNOSIS — I6930 Unspecified sequelae of cerebral infarction: Secondary | ICD-10-CM

## 2024-05-22 DIAGNOSIS — N179 Acute kidney failure, unspecified: Secondary | ICD-10-CM | POA: Diagnosis present

## 2024-05-22 DIAGNOSIS — N202 Calculus of kidney with calculus of ureter: Secondary | ICD-10-CM | POA: Diagnosis present

## 2024-05-22 DIAGNOSIS — Z1624 Resistance to multiple antibiotics: Secondary | ICD-10-CM | POA: Diagnosis present

## 2024-05-22 DIAGNOSIS — N136 Pyonephrosis: Secondary | ICD-10-CM | POA: Diagnosis present

## 2024-05-22 DIAGNOSIS — Z8249 Family history of ischemic heart disease and other diseases of the circulatory system: Secondary | ICD-10-CM

## 2024-05-22 DIAGNOSIS — N401 Enlarged prostate with lower urinary tract symptoms: Secondary | ICD-10-CM | POA: Diagnosis present

## 2024-05-22 DIAGNOSIS — Z87891 Personal history of nicotine dependence: Secondary | ICD-10-CM

## 2024-05-22 DIAGNOSIS — Z66 Do not resuscitate: Secondary | ICD-10-CM | POA: Diagnosis present

## 2024-05-22 DIAGNOSIS — E872 Acidosis, unspecified: Secondary | ICD-10-CM | POA: Diagnosis present

## 2024-05-22 DIAGNOSIS — N12 Tubulo-interstitial nephritis, not specified as acute or chronic: Secondary | ICD-10-CM

## 2024-05-22 DIAGNOSIS — Z961 Presence of intraocular lens: Secondary | ICD-10-CM | POA: Diagnosis present

## 2024-05-22 DIAGNOSIS — I110 Hypertensive heart disease with heart failure: Secondary | ICD-10-CM | POA: Diagnosis present

## 2024-05-22 DIAGNOSIS — B965 Pseudomonas (aeruginosa) (mallei) (pseudomallei) as the cause of diseases classified elsewhere: Secondary | ICD-10-CM | POA: Diagnosis present

## 2024-05-22 DIAGNOSIS — Z7902 Long term (current) use of antithrombotics/antiplatelets: Secondary | ICD-10-CM

## 2024-05-22 DIAGNOSIS — M4856XA Collapsed vertebra, not elsewhere classified, lumbar region, initial encounter for fracture: Secondary | ICD-10-CM | POA: Diagnosis present

## 2024-05-22 DIAGNOSIS — I5032 Chronic diastolic (congestive) heart failure: Secondary | ICD-10-CM | POA: Diagnosis present

## 2024-05-22 DIAGNOSIS — A419 Sepsis, unspecified organism: Principal | ICD-10-CM | POA: Diagnosis present

## 2024-05-22 DIAGNOSIS — K449 Diaphragmatic hernia without obstruction or gangrene: Secondary | ICD-10-CM | POA: Diagnosis present

## 2024-05-22 DIAGNOSIS — S32030A Wedge compression fracture of third lumbar vertebra, initial encounter for closed fracture: Secondary | ICD-10-CM | POA: Diagnosis present

## 2024-05-22 DIAGNOSIS — N138 Other obstructive and reflux uropathy: Secondary | ICD-10-CM | POA: Diagnosis present

## 2024-05-22 DIAGNOSIS — N209 Urinary calculus, unspecified: Secondary | ICD-10-CM | POA: Diagnosis present

## 2024-05-22 DIAGNOSIS — N210 Calculus in bladder: Secondary | ICD-10-CM | POA: Diagnosis present

## 2024-05-22 DIAGNOSIS — E785 Hyperlipidemia, unspecified: Secondary | ICD-10-CM | POA: Diagnosis present

## 2024-05-22 DIAGNOSIS — Z79899 Other long term (current) drug therapy: Secondary | ICD-10-CM

## 2024-05-22 DIAGNOSIS — Z880 Allergy status to penicillin: Secondary | ICD-10-CM

## 2024-05-22 DIAGNOSIS — B964 Proteus (mirabilis) (morganii) as the cause of diseases classified elsewhere: Secondary | ICD-10-CM | POA: Diagnosis present

## 2024-05-22 DIAGNOSIS — I714 Abdominal aortic aneurysm, without rupture, unspecified: Secondary | ICD-10-CM | POA: Diagnosis present

## 2024-05-22 LAB — CBC WITH DIFFERENTIAL/PLATELET
Abs Immature Granulocytes: 0.05 K/uL (ref 0.00–0.07)
Basophils Absolute: 0 K/uL (ref 0.0–0.1)
Basophils Relative: 0 %
Eosinophils Absolute: 0 K/uL (ref 0.0–0.5)
Eosinophils Relative: 0 %
HCT: 48.1 % (ref 39.0–52.0)
Hemoglobin: 15.8 g/dL (ref 13.0–17.0)
Immature Granulocytes: 0 %
Lymphocytes Relative: 2 %
Lymphs Abs: 0.4 K/uL — ABNORMAL LOW (ref 0.7–4.0)
MCH: 29.9 pg (ref 26.0–34.0)
MCHC: 32.8 g/dL (ref 30.0–36.0)
MCV: 90.9 fL (ref 80.0–100.0)
Monocytes Absolute: 0.1 K/uL (ref 0.1–1.0)
Monocytes Relative: 0 %
Neutro Abs: 15.6 K/uL — ABNORMAL HIGH (ref 1.7–7.7)
Neutrophils Relative %: 98 %
Platelets: 195 K/uL (ref 150–400)
RBC: 5.29 MIL/uL (ref 4.22–5.81)
RDW: 13.3 % (ref 11.5–15.5)
WBC: 16.2 K/uL — ABNORMAL HIGH (ref 4.0–10.5)
nRBC: 0 % (ref 0.0–0.2)

## 2024-05-22 LAB — COMPREHENSIVE METABOLIC PANEL WITH GFR
ALT: 28 U/L (ref 0–44)
AST: 29 U/L (ref 15–41)
Albumin: 4 g/dL (ref 3.5–5.0)
Alkaline Phosphatase: 118 U/L (ref 38–126)
Anion gap: 17 — ABNORMAL HIGH (ref 5–15)
BUN: 26 mg/dL — ABNORMAL HIGH (ref 8–23)
CO2: 18 mmol/L — ABNORMAL LOW (ref 22–32)
Calcium: 9.6 mg/dL (ref 8.9–10.3)
Chloride: 105 mmol/L (ref 98–111)
Creatinine, Ser: 1.12 mg/dL (ref 0.61–1.24)
GFR, Estimated: >60 mL/min
Glucose, Bld: 150 mg/dL — ABNORMAL HIGH (ref 70–99)
Potassium: 4.5 mmol/L (ref 3.5–5.1)
Sodium: 139 mmol/L (ref 135–145)
Total Bilirubin: 0.6 mg/dL (ref 0.0–1.2)
Total Protein: 8 g/dL (ref 6.5–8.1)

## 2024-05-22 LAB — LIPASE, BLOOD: Lipase: <10 U/L — ABNORMAL LOW (ref 11–51)

## 2024-05-22 MED ORDER — IOHEXOL 350 MG/ML SOLN
75.0000 mL | Freq: Once | INTRAVENOUS | Status: AC | PRN
  Administered 2024-05-22: 75 mL via INTRAVENOUS

## 2024-05-22 MED ORDER — HYDROMORPHONE HCL 1 MG/ML IJ SOLN
0.5000 mg | Freq: Once | INTRAMUSCULAR | Status: DC
  Administered 2024-05-23: 0.5 mg via INTRAVENOUS
  Filled 2024-05-22: qty 1

## 2024-05-22 NOTE — ED Notes (Signed)
 Pt has increased work of breathing.  Placed on 2L [place] for sats of 91%.

## 2024-05-22 NOTE — ED Provider Notes (Addendum)
 " [facility] AT [facility] Provider Note   CSN: [id number] Arrival date & time: 05/22/24  1936     Patient presents with: Back Pain   Gregory Kerr is a 84 y.o. male.    Back Pain presents with left back pain/flank pain.  Began this afternoon.  No fevers.  Does feel somewhat short of breath.  Worse with certain movements.  No rash.  No trauma.  Has not had pains like this before.  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 Authorizing Provider  albuterol  (VENTOLIN  HFA) 108 (90 Base) MCG/ACT inhaler Inhale 1-2 puffs into the lungs every 6 (six) hours as needed for wheezing or shortness of breath. 07/15/22   Prosperi, Christian H, PA-C  ARTIFICIAL TEAR OP Place 1 drop into both eyes 3 (three) times daily. 01/10/22   [provider]  atorvastatin  (LIPITOR) 40 MG tablet Take 1 tablet (40 mg total) by mouth daily. Patient taking differently: Take 40 mg by mouth every evening. 11/22/20   Golda Lynwood PARAS, MD  baclofen  (LIORESAL ) 20 MG tablet Take 20 mg by mouth 3 (three) times daily.    [provider]  bisacodyl  (DULCOLAX) 10 MG suppository Place 10 mg rectally daily as needed for moderate constipation. 10/18/21   [provider]  calcium -vitamin D  (OSCAL WITH D) 500-5 MG-MCG tablet Take 1 tablet by mouth 2 (two) times daily.    [provider]  clopidogrel  (PLAVIX ) 75 MG tablet Take 1 tablet (75 mg  total) by mouth daily. 11/22/20   Golda Lynwood PARAS, MD  Dextromethorphan -quiNIDine (NUEDEXTA ) 20-10 MG capsule Take 1 capsule by mouth every evening. For Pseudobulbar Affect (PBA)    [provider]  gabapentin  (NEURONTIN ) 100 MG capsule Take 100 mg by mouth 3 (three) times daily.    [provider]  guaiFENesin  (MUCINEX ) 600 MG 12 hr tablet Take 600 mg by mouth 2 (two) times daily.    [provider]  loperamide (IMODIUM A-D) 2 MG tablet Take 4 mg by mouth 4 (four) times daily as needed for diarrhea or loose stools.    [provider]  loratadine  (CLARITIN ) 10 MG tablet Take 10 mg by mouth every evening.    [provider]  nitroGLYCERIN  (NITROSTAT ) 0.4 MG SL tablet Place 0.4 mg under the tongue every 5 (five) minutes as needed for chest pain.    [provider]  nystatin  (MYCOSTATIN /NYSTOP ) powder Apply 1 application  topically 3 (three) times daily. 1 application TID to suprapubic area, bilateral groin and penis 01/10/22   [provider]  pantoprazole  (PROTONIX ) 20 MG tablet Take 20 mg by mouth daily. 11/17/20   [provider]  phenazopyridine  (PYRIDIUM ) 100 MG tablet Take 100 mg by mouth every 8 (eight) hours as needed (bladder spasms).    [provider]  polyethylene glycol (MIRALAX  / GLYCOLAX ) packet Take 17 g by mouth  daily as needed (constipation).     [provider]  Prenatal Vit-Fe Fumarate-FA (PREPLUS) 27-1 MG TABS Take 1 tablet by mouth daily.    [provider]  PROLIA 60 MG/ML SOSY injection Inject 60 mg into the skin every 6 (six) months. 09/07/20   [provider]  saccharomyces boulardii (FLORASTOR) 250 MG capsule Take 250 mg by mouth every 12 (twelve) hours.    [provider]  sertraline  (ZOLOFT ) 100 MG tablet Take 100 mg by mouth daily.    [provider]  solifenacin  (VESICARE ) 5 MG tablet Take 1 tablet (5 mg total) by mouth daily. 11/21/20   Golda Lynwood PARAS, MD   tamsulosin  (FLOMAX ) 0.4 MG CAPS capsule Take 1 capsule (0.4 mg total) by mouth at bedtime. 11/21/20   Golda Lynwood PARAS, MD  traMADol  (ULTRAM ) 50 MG tablet Take 1 tablet (50 mg total) by mouth See admin instructions. Take 50 mg twice daily scheduled, may take a 3rd 50 mg dose as needed for pain 02/15/22   Gherghe, Costin M, MD  trimethoprim  (TRIMPEX ) 100 MG tablet Take 100 mg by mouth daily. **No stop date** per [facility] 02/03/22   [provider]    Allergies: Penicillins    Review of Systems  Musculoskeletal:  Positive for back pain.    Updated Vital Signs BP (!) 176/92 (BP Location: Right Arm)   Pulse (!) 119   Temp 98.1 F (36.7 C) (Oral)   Resp (!) 24   Ht 5' 6 (1.676 m)   Wt 78.4 kg   SpO2 94%   BMI 27.90 kg/m   Physical Exam Vitals and nursing note reviewed.  HENT:     Head: Atraumatic.  Cardiovascular:     Rate and Rhythm: Tachycardia present.  Pulmonary:     Comments: Some tenderness on the left ribs. Chest:     Chest wall: Tenderness present.  Abdominal:     Tenderness: There is no abdominal tenderness.  Genitourinary:    Comments: Some tenderness to the left CVA area without rash. Skin:    Capillary Refill: Capillary refill takes less than 2 seconds.  Neurological:     Mental Status: He is alert.     Comments: Awake and answers questions.  Left upper extremity constricted.     (all labs ordered are listed, but only abnormal results are displayed) Labs Reviewed  CBC WITH DIFFERENTIAL/PLATELET  COMPREHENSIVE METABOLIC PANEL WITH GFR  LIPASE, BLOOD  URINALYSIS, ROUTINE W REFLEX MICROSCOPIC    EKG: EKG Interpretation Date/Time:  Thursday May 22 2024 19:55:43 EST Ventricular Rate:  122 PR Interval:  152 QRS Duration:  80 QT Interval:  286 QTC Calculation: 407 R Axis:   2  Text Interpretation: Sinus tachycardia Possible Left atrial enlargement ST & T wave abnormality, consider inferolateral ischemia Abnormal ECG When compared with ECG of  17-Oct-2022 14:33, PREVIOUS ECG IS PRESENT since last tracing no significant change Confirmed by Lenor Hollering [phone]) on 05/22/2024 8:32:38 PM  Radiology: No results found.   Procedures   Medications Ordered in the ED - No data to display                                  Medical Decision Making Amount and/or Complexity of Data Reviewed Radiology: ordered.  Risk Prescription drug management.   Patient with flank/left-sided pain.  Has had some mild hypoxia.  Differential diagnosis does include PE since it does appear  to be pleuritic.  Also has flank pain.  Has had a previous right sided stone in the kidney.  I think would benefit from blood work urinalysis and CT scan to evaluate for both PE and stone/intra-abdominal pathology.  White count elevated at 16.  BUN mildly elevated 26.  Lipase normal.  Urinalysis still pending.  Care will be turned over to oncoming provider, Dr Midge     Final diagnoses:  None    ED Discharge Orders     None          Patsey Lot, MD 05/22/24 [id number]    Patsey Lot, MD 05/22/24 [id number]  "

## 2024-05-22 NOTE — ED Triage Notes (Signed)
 Pt here from [facility] for L sided flank pain, acute onset this afternoon. Given 100 mcg fentanyl  for severe pain by ems, bringing pain from 10 to 3.  Bp 158/96 Hr 120 Rr 92% RA Cbg 198  22 R foot

## 2024-05-22 NOTE — ED Notes (Signed)
 Patient transported to CT

## 2024-05-23 ENCOUNTER — Emergency Department (HOSPITAL_COMMUNITY)

## 2024-05-23 ENCOUNTER — Emergency Department (HOSPITAL_COMMUNITY): Admitting: Critical Care Medicine

## 2024-05-23 ENCOUNTER — Encounter (HOSPITAL_COMMUNITY): Payer: Self-pay | Admitting: Critical Care Medicine

## 2024-05-23 ENCOUNTER — Encounter (HOSPITAL_COMMUNITY)
Admission: EM | Disposition: A | Discharge status: Skilled Nursing Facility | Payer: Self-pay | Source: Skilled Nursing Facility | Attending: Internal Medicine

## 2024-05-23 DIAGNOSIS — I110 Hypertensive heart disease with heart failure: Secondary | ICD-10-CM

## 2024-05-23 DIAGNOSIS — R1032 Left lower quadrant pain: Secondary | ICD-10-CM | POA: Diagnosis not present

## 2024-05-23 DIAGNOSIS — I2510 Atherosclerotic heart disease of native coronary artery without angina pectoris: Secondary | ICD-10-CM | POA: Diagnosis not present

## 2024-05-23 DIAGNOSIS — Z8249 Family history of ischemic heart disease and other diseases of the circulatory system: Secondary | ICD-10-CM | POA: Diagnosis not present

## 2024-05-23 DIAGNOSIS — E785 Hyperlipidemia, unspecified: Secondary | ICD-10-CM | POA: Diagnosis present

## 2024-05-23 DIAGNOSIS — N401 Enlarged prostate with lower urinary tract symptoms: Secondary | ICD-10-CM | POA: Diagnosis present

## 2024-05-23 DIAGNOSIS — N179 Acute kidney failure, unspecified: Secondary | ICD-10-CM

## 2024-05-23 DIAGNOSIS — N209 Urinary calculus, unspecified: Secondary | ICD-10-CM | POA: Diagnosis present

## 2024-05-23 DIAGNOSIS — Z1624 Resistance to multiple antibiotics: Secondary | ICD-10-CM | POA: Diagnosis present

## 2024-05-23 DIAGNOSIS — Z66 Do not resuscitate: Secondary | ICD-10-CM | POA: Diagnosis present

## 2024-05-23 DIAGNOSIS — Z87891 Personal history of nicotine dependence: Secondary | ICD-10-CM | POA: Diagnosis not present

## 2024-05-23 DIAGNOSIS — S32030A Wedge compression fracture of third lumbar vertebra, initial encounter for closed fracture: Secondary | ICD-10-CM

## 2024-05-23 DIAGNOSIS — E872 Acidosis, unspecified: Secondary | ICD-10-CM | POA: Diagnosis present

## 2024-05-23 DIAGNOSIS — N136 Pyonephrosis: Secondary | ICD-10-CM | POA: Diagnosis present

## 2024-05-23 DIAGNOSIS — I714 Abdominal aortic aneurysm, without rupture, unspecified: Secondary | ICD-10-CM | POA: Diagnosis present

## 2024-05-23 DIAGNOSIS — K449 Diaphragmatic hernia without obstruction or gangrene: Secondary | ICD-10-CM | POA: Diagnosis present

## 2024-05-23 DIAGNOSIS — A419 Sepsis, unspecified organism: Secondary | ICD-10-CM | POA: Diagnosis present

## 2024-05-23 DIAGNOSIS — K3189 Other diseases of stomach and duodenum: Secondary | ICD-10-CM | POA: Diagnosis present

## 2024-05-23 DIAGNOSIS — N202 Calculus of kidney with calculus of ureter: Secondary | ICD-10-CM

## 2024-05-23 DIAGNOSIS — B965 Pseudomonas (aeruginosa) (mallei) (pseudomallei) as the cause of diseases classified elsewhere: Secondary | ICD-10-CM | POA: Diagnosis present

## 2024-05-23 DIAGNOSIS — I509 Heart failure, unspecified: Secondary | ICD-10-CM | POA: Diagnosis not present

## 2024-05-23 DIAGNOSIS — N400 Enlarged prostate without lower urinary tract symptoms: Secondary | ICD-10-CM

## 2024-05-23 DIAGNOSIS — I5032 Chronic diastolic (congestive) heart failure: Secondary | ICD-10-CM | POA: Diagnosis present

## 2024-05-23 DIAGNOSIS — I6930 Unspecified sequelae of cerebral infarction: Secondary | ICD-10-CM

## 2024-05-23 DIAGNOSIS — N218 Other lower urinary tract calculus: Secondary | ICD-10-CM

## 2024-05-23 DIAGNOSIS — Z7902 Long term (current) use of antithrombotics/antiplatelets: Secondary | ICD-10-CM | POA: Diagnosis not present

## 2024-05-23 DIAGNOSIS — N210 Calculus in bladder: Secondary | ICD-10-CM | POA: Diagnosis present

## 2024-05-23 DIAGNOSIS — B964 Proteus (mirabilis) (morganii) as the cause of diseases classified elsewhere: Secondary | ICD-10-CM | POA: Diagnosis present

## 2024-05-23 DIAGNOSIS — N138 Other obstructive and reflux uropathy: Secondary | ICD-10-CM | POA: Diagnosis present

## 2024-05-23 DIAGNOSIS — I5030 Unspecified diastolic (congestive) heart failure: Secondary | ICD-10-CM | POA: Diagnosis not present

## 2024-05-23 DIAGNOSIS — I69354 Hemiplegia and hemiparesis following cerebral infarction affecting left non-dominant side: Secondary | ICD-10-CM | POA: Diagnosis not present

## 2024-05-23 DIAGNOSIS — Z79899 Other long term (current) drug therapy: Secondary | ICD-10-CM | POA: Diagnosis not present

## 2024-05-23 DIAGNOSIS — N390 Urinary tract infection, site not specified: Secondary | ICD-10-CM | POA: Diagnosis not present

## 2024-05-23 DIAGNOSIS — M4856XA Collapsed vertebra, not elsewhere classified, lumbar region, initial encounter for fracture: Secondary | ICD-10-CM | POA: Diagnosis present

## 2024-05-23 HISTORY — PX: CYSTOSCOPY W/ URETERAL STENT PLACEMENT: SHX1429

## 2024-05-23 LAB — URINALYSIS, ROUTINE W REFLEX MICROSCOPIC
Bilirubin Urine: NEGATIVE
Glucose, UA: NEGATIVE mg/dL
Ketones, ur: NEGATIVE mg/dL
Nitrite: POSITIVE — AB
Protein, ur: 100 mg/dL — AB
RBC / HPF: >50 RBC/hpf (ref 0–5)
Specific Gravity, Urine: 1.038 — ABNORMAL HIGH (ref 1.005–1.030)
pH: 8 (ref 5.0–8.0)

## 2024-05-23 LAB — I-STAT CG4 LACTIC ACID, ED: Lactic Acid, Venous: 4.5 mmol/L (ref 0.5–1.9)

## 2024-05-23 LAB — CBG MONITORING, ED: Glucose-Capillary: 140 mg/dL — ABNORMAL HIGH (ref 70–99)

## 2024-05-23 MED ORDER — SUGAMMADEX SODIUM 200 MG/2ML IV SOLN
INTRAVENOUS | Status: AC
  Filled 2024-05-23: qty 2

## 2024-05-23 MED ORDER — ONDANSETRON HCL 4 MG/2ML IJ SOLN
INTRAMUSCULAR | Status: DC | PRN
  Administered 2024-05-23: 4 mg via INTRAVENOUS

## 2024-05-23 MED ORDER — SUGAMMADEX SODIUM 200 MG/2ML IV SOLN
INTRAVENOUS | Status: DC | PRN
  Administered 2024-05-23: 200 mg via INTRAVENOUS

## 2024-05-23 MED ORDER — FENTANYL CITRATE (PF) 100 MCG/2ML IJ SOLN
INTRAMUSCULAR | Status: AC
  Filled 2024-05-23: qty 2

## 2024-05-23 MED ORDER — FENTANYL CITRATE (PF) 50 MCG/ML IJ SOSY
50.0000 ug | PREFILLED_SYRINGE | Freq: Once | INTRAMUSCULAR | Status: AC
  Administered 2024-05-23: 50 ug via INTRAVENOUS
  Filled 2024-05-23: qty 1

## 2024-05-23 MED ORDER — LACTATED RINGERS IV BOLUS (SEPSIS): 500.0000 mL | Freq: Once | INTRAVENOUS | Status: DC

## 2024-05-23 MED ORDER — DEXAMETHASONE SOD PHOSPHATE PF 10 MG/ML IJ SOLN
INTRAMUSCULAR | Status: DC | PRN
  Administered 2024-05-23: 4 mg via INTRAVENOUS

## 2024-05-23 MED ORDER — BISACODYL 10 MG RE SUPP: 10.0000 mg | Freq: Every day | RECTAL | Status: DC | PRN

## 2024-05-23 MED ORDER — ACETAMINOPHEN 650 MG RE SUPP: 650.0000 mg | Freq: Four times a day (QID) | RECTAL | Status: DC | PRN

## 2024-05-23 MED ORDER — ATORVASTATIN CALCIUM 40 MG PO TABS
40.0000 mg | ORAL_TABLET | Freq: Every evening | ORAL | Status: DC
  Administered 2024-05-23 – 2024-05-27 (×5): 40 mg via ORAL
  Filled 2024-05-23 (×5): qty 1

## 2024-05-23 MED ORDER — GABAPENTIN 100 MG PO CAPS
200.0000 mg | ORAL_CAPSULE | Freq: Three times a day (TID) | ORAL | Status: DC
  Administered 2024-05-23 – 2024-05-28 (×15): 200 mg via ORAL
  Filled 2024-05-23 (×15): qty 2

## 2024-05-23 MED ORDER — SODIUM CHLORIDE 0.9 % IV SOLN
2.0000 g | INTRAVENOUS | Status: DC
  Administered 2024-05-24 – 2024-05-26 (×3): 2 g via INTRAVENOUS
  Filled 2024-05-23 (×3): qty 20

## 2024-05-23 MED ORDER — TAMSULOSIN HCL 0.4 MG PO CAPS
0.4000 mg | ORAL_CAPSULE | Freq: Every day | ORAL | Status: DC
  Administered 2024-05-23 – 2024-05-27 (×5): 0.4 mg via ORAL
  Filled 2024-05-23 (×5): qty 1

## 2024-05-23 MED ORDER — LOPERAMIDE HCL 2 MG PO CAPS: 4.0000 mg | ORAL_CAPSULE | Freq: Four times a day (QID) | ORAL | Status: DC | PRN

## 2024-05-23 MED ORDER — OXYCODONE HCL 5 MG PO TABS: 5.0000 mg | ORAL_TABLET | Freq: Once | ORAL | Status: DC | PRN

## 2024-05-23 MED ORDER — LACTATED RINGERS IV SOLN: INTRAVENOUS | Status: AC

## 2024-05-23 MED ORDER — PHENYLEPHRINE HCL-NACL 20-0.9 MG/250ML-% IV SOLN
INTRAVENOUS | Status: DC | PRN
  Administered 2024-05-23: 20 ug/min via INTRAVENOUS

## 2024-05-23 MED ORDER — PROPOFOL 10 MG/ML IV BOLUS
INTRAVENOUS | Status: AC
  Filled 2024-05-23: qty 20

## 2024-05-23 MED ORDER — ENOXAPARIN SODIUM 40 MG/0.4ML IJ SOSY
40.0000 mg | PREFILLED_SYRINGE | INTRAMUSCULAR | Status: DC
  Administered 2024-05-23 – 2024-05-27 (×5): 40 mg via SUBCUTANEOUS
  Filled 2024-05-23 (×5): qty 0.4

## 2024-05-23 MED ORDER — SODIUM CHLORIDE 0.9 % IR SOLN
Status: DC | PRN
  Administered 2024-05-23: 3000 mL

## 2024-05-23 MED ORDER — ALBUTEROL SULFATE (2.5 MG/3ML) 0.083% IN NEBU: 2.5000 mg | INHALATION_SOLUTION | Freq: Four times a day (QID) | RESPIRATORY_TRACT | Status: DC | PRN

## 2024-05-23 MED ORDER — SERTRALINE HCL 100 MG PO TABS
100.0000 mg | ORAL_TABLET | Freq: Every day | ORAL | Status: DC
  Administered 2024-05-24 – 2024-05-28 (×5): 100 mg via ORAL
  Filled 2024-05-23 (×5): qty 1

## 2024-05-23 MED ORDER — PROPOFOL 10 MG/ML IV BOLUS
INTRAVENOUS | Status: DC | PRN
  Administered 2024-05-23: 20 mg via INTRAVENOUS
  Administered 2024-05-23: 110 mg via INTRAVENOUS

## 2024-05-23 MED ORDER — HYDROCODONE-ACETAMINOPHEN 5-325 MG PO TABS
1.0000 | ORAL_TABLET | Freq: Four times a day (QID) | ORAL | Status: DC | PRN
  Administered 2024-05-23 – 2024-05-26 (×4): 1 via ORAL
  Filled 2024-05-23 (×4): qty 1

## 2024-05-23 MED ORDER — ENOXAPARIN SODIUM 40 MG/0.4ML IJ SOSY: 40.0000 mg | PREFILLED_SYRINGE | INTRAMUSCULAR | Status: DC

## 2024-05-23 MED ORDER — IOHEXOL 300 MG/ML  SOLN
INTRAMUSCULAR | Status: DC | PRN
  Administered 2024-05-23: 10 mL

## 2024-05-23 MED ORDER — PHENYLEPHRINE 80 MCG/ML (10ML) SYRINGE FOR IV PUSH (FOR BLOOD PRESSURE SUPPORT)
PREFILLED_SYRINGE | INTRAVENOUS | Status: DC | PRN
  Administered 2024-05-23: 160 ug via INTRAVENOUS

## 2024-05-23 MED ORDER — PHENYLEPHRINE 80 MCG/ML (10ML) SYRINGE FOR IV PUSH (FOR BLOOD PRESSURE SUPPORT)
PREFILLED_SYRINGE | INTRAVENOUS | Status: AC
  Filled 2024-05-23: qty 10

## 2024-05-23 MED ORDER — SODIUM CHLORIDE 0.9 % IV BOLUS (SEPSIS)
1000.0000 mL | Freq: Once | INTRAVENOUS | Status: AC
  Administered 2024-05-23: 1000 mL via INTRAVENOUS

## 2024-05-23 MED ORDER — LACTATED RINGERS IV BOLUS (SEPSIS)
1000.0000 mL | Freq: Once | INTRAVENOUS | Status: AC
  Administered 2024-05-23: 1000 mL via INTRAVENOUS

## 2024-05-23 MED ORDER — PANTOPRAZOLE SODIUM 20 MG PO TBEC
20.0000 mg | DELAYED_RELEASE_TABLET | Freq: Every day | ORAL | Status: DC
  Administered 2024-05-24 – 2024-05-28 (×5): 20 mg via ORAL
  Filled 2024-05-23 (×5): qty 1

## 2024-05-23 MED ORDER — LIDOCAINE HCL URETHRAL/MUCOSAL 2 % EX GEL
CUTANEOUS | Status: AC
  Filled 2024-05-23: qty 11

## 2024-05-23 MED ORDER — POLYETHYLENE GLYCOL 3350 17 G PO PACK: 17.0000 g | PACK | Freq: Every day | ORAL | Status: DC | PRN

## 2024-05-23 MED ORDER — LIDOCAINE 2% (20 MG/ML) 5 ML SYRINGE
INTRAMUSCULAR | Status: AC
  Filled 2024-05-23: qty 5

## 2024-05-23 MED ORDER — SODIUM CHLORIDE 0.9 % IV SOLN
1.0000 g | Freq: Once | INTRAVENOUS | Status: AC
  Administered 2024-05-23: 1 g via INTRAVENOUS
  Filled 2024-05-23: qty 10

## 2024-05-23 MED ORDER — LIDOCAINE 2% (20 MG/ML) 5 ML SYRINGE
INTRAMUSCULAR | Status: DC | PRN
  Administered 2024-05-23: 50 mg via INTRAVENOUS

## 2024-05-23 MED ORDER — DEXTROMETHORPHAN-QUINIDINE 20-10 MG PO CAPS
1.0000 | ORAL_CAPSULE | Freq: Every day | ORAL | Status: DC
  Administered 2024-05-23 – 2024-05-27 (×5): 1 via ORAL
  Filled 2024-05-23 (×6): qty 1

## 2024-05-23 MED ORDER — OXYCODONE HCL 5 MG/5ML PO SOLN: 5.0000 mg | Freq: Once | ORAL | Status: DC | PRN

## 2024-05-23 MED ORDER — PRO-STAT AWC PO LIQD: 30.0000 mL | Freq: Two times a day (BID) | ORAL | Status: DC

## 2024-05-23 MED ORDER — LACTATED RINGERS IV BOLUS (SEPSIS): 1000.0000 mL | Freq: Once | INTRAVENOUS | Status: DC

## 2024-05-23 MED ORDER — ROCURONIUM BROMIDE 10 MG/ML (PF) SYRINGE
PREFILLED_SYRINGE | INTRAVENOUS | Status: AC
  Filled 2024-05-23: qty 10

## 2024-05-23 MED ORDER — CHLORHEXIDINE GLUCONATE CLOTH 2 % EX PADS
6.0000 | MEDICATED_PAD | Freq: Every day | CUTANEOUS | Status: DC
  Administered 2024-05-24 – 2024-05-28 (×5): 6 via TOPICAL

## 2024-05-23 MED ORDER — ONDANSETRON HCL 4 MG/2ML IJ SOLN
INTRAMUSCULAR | Status: AC
  Filled 2024-05-23: qty 2

## 2024-05-23 MED ORDER — BACLOFEN 20 MG PO TABS
20.0000 mg | ORAL_TABLET | Freq: Three times a day (TID) | ORAL | Status: DC
  Administered 2024-05-23 – 2024-05-28 (×15): 20 mg via ORAL
  Filled 2024-05-23: qty 1
  Filled 2024-05-23: qty 2
  Filled 2024-05-23: qty 1
  Filled 2024-05-23 (×3): qty 2
  Filled 2024-05-23 (×4): qty 1
  Filled 2024-05-23: qty 2
  Filled 2024-05-23 (×4): qty 1
  Filled 2024-05-23 (×2): qty 2
  Filled 2024-05-23: qty 1
  Filled 2024-05-23 (×2): qty 2
  Filled 2024-05-23 (×2): qty 1
  Filled 2024-05-23: qty 2
  Filled 2024-05-23 (×4): qty 1
  Filled 2024-05-23: qty 2

## 2024-05-23 MED ORDER — SACCHAROMYCES BOULARDII 250 MG PO CAPS
250.0000 mg | ORAL_CAPSULE | Freq: Two times a day (BID) | ORAL | Status: DC
  Administered 2024-05-23 – 2024-05-28 (×10): 250 mg via ORAL
  Filled 2024-05-23 (×12): qty 1

## 2024-05-23 MED ORDER — SERTRALINE HCL 25 MG PO TABS
25.0000 mg | ORAL_TABLET | Freq: Every day | ORAL | Status: DC
  Administered 2024-05-24 – 2024-05-28 (×5): 25 mg via ORAL
  Filled 2024-05-23 (×5): qty 1

## 2024-05-23 MED ORDER — SUCCINYLCHOLINE CHLORIDE 200 MG/10ML IV SOSY
PREFILLED_SYRINGE | INTRAVENOUS | Status: AC
  Filled 2024-05-23: qty 10

## 2024-05-23 MED ORDER — ROCURONIUM BROMIDE 10 MG/ML (PF) SYRINGE
PREFILLED_SYRINGE | INTRAVENOUS | Status: DC | PRN
  Administered 2024-05-23: 50 mg via INTRAVENOUS

## 2024-05-23 MED ORDER — FENTANYL CITRATE (PF) 100 MCG/2ML IJ SOLN
25.0000 ug | INTRAMUSCULAR | Status: DC | PRN
  Administered 2024-05-23: 25 ug via INTRAVENOUS

## 2024-05-23 MED ORDER — ACETAMINOPHEN 650 MG RE SUPP
650.0000 mg | Freq: Once | RECTAL | Status: AC
  Administered 2024-05-23: 650 mg via RECTAL
  Filled 2024-05-23: qty 1

## 2024-05-23 MED ORDER — ACETAMINOPHEN 325 MG PO TABS
650.0000 mg | ORAL_TABLET | Freq: Four times a day (QID) | ORAL | Status: DC | PRN
  Administered 2024-05-26: 650 mg via ORAL
  Filled 2024-05-23: qty 2

## 2024-05-23 MED ORDER — FENTANYL CITRATE (PF) 50 MCG/ML IJ SOSY: 25.0000 ug | PREFILLED_SYRINGE | INTRAMUSCULAR | Status: DC | PRN

## 2024-05-23 MED ORDER — SODIUM CHLORIDE 0.9 % IV SOLN
INTRAVENOUS | Status: AC
  Administered 2024-05-24: 75 mL/h via INTRAVENOUS

## 2024-05-23 NOTE — Consult Note (Signed)
 "  CC/Reason for consult: Large hiatal hernia without obstruction  Requesting physician: Nancyann Kin, MD  HPI: Gregory Kerr is an 84 y.o. male with hx of CVA (L sided paresis) HTN, GERD, brought in to ER from nursing facility for evaluation of left flank pain of 1 day duration. 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  He received narcotics in ER recently and is unable to provide any history to me - currently unable to answer questions to me.   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   Pneumonia    once or twice (09/22/2014)   Retinal detachment    left eye   Stroke [facility]) 2007   left side doesn't work now (09/22/2014)    Past Surgical History:  Procedure Laterality Date   APPENDECTOMY  05/27/2009   Jeoffrey Dawn, MD   CATARACT EXTRACTION W/ INTRAOCULAR LENS  IMPLANT, BILATERAL Bilateral ?2014   CHOLECYSTECTOMY N/A 09/26/2014   Procedure: LAPAROSCOPIC CHOLECYSTECTOMY WITH INTRAOPERATIVE CHOLANGIOGRAM;  Surgeon: Donnice Lima, MD;  Location: [facility] OR;  Service: General;  Laterality: N/A;   COLONOSCOPY WITH PROPOFOL  N/A 12/29/2015   Procedure: COLONOSCOPY WITH PROPOFOL ;  Surgeon: Jerrell Sol, MD;  Location: [facility] ENDOSCOPY;  Service: Endoscopy;  Laterality: N/A;   ERCP N/A 09/25/2014   Procedure: ENDOSCOPIC RETROGRADE CHOLANGIOPANCREATOGRAPHY (ERCP);  Surgeon: Oliva Boots, MD;  Location: [facility] ENDOSCOPY;  Service: Endoscopy;  Laterality: N/A;   IR CATHETER TUBE CHANGE  05/24/2021   PARS PLANA VITRECTOMY Left 01/30/2019   Procedure: PARS PLANA VITRECTOMY WITH 25  GAUGE;  Surgeon: Jarold Mayo, MD;  Location: [facility] OR;  Service: Ophthalmology;  Laterality: Left;   PARS PLANA VITRECTOMY Right 11/13/2019   Procedure: PARS PLANA VITRECTOMY WITH 25 GAUGE WITH REMOVAL OF POSTERIOR CAPSULAR OPACIFICATION WITH VITRECTOR;  Surgeon: Jarold Mayo, MD;  Location: [facility] OR;  Service: Ophthalmology;  Laterality: Right;   PHOTOCOAGULATION WITH LASER Left 01/30/2019   Procedure: PHOTOCOAGULATION WITH LASER;  Surgeon: Jarold Mayo, MD;  Location[facility] OR;  Service: Ophthalmology;  Laterality: Left;    Family History  Problem Relation Age of Onset   Heart attack Mother    Heart attack Father    Cancer Maternal Aunt        Stomach   Cancer Paternal Wylie Kennyth Proud    Social:  reports that he quit smoking about 18 years ago. His smoking use included cigarettes. He smoked an average of 1 pack per day. He has never used smokeless tobacco. He reports that he does not currently use alcohol . He reports that he does not use drugs.  Allergies: Allergies[1]  Medications: I have reviewed the patient's current medications.  Results for orders placed or performed during the hospital encounter of 05/22/24 (from the past 48 hours)  CBC with Differential     Status: Abnormal   Collection Time: 05/22/24  8:29 PM  Result Value Ref Range   WBC 16.2 (H) 4.0 - 10.5 K/uL   RBC 5.29 4.22 - 5.81 MIL/uL   Hemoglobin 15.8 13.0 - 17.0 g/dL   HCT 48.1  39.0 - 52.0 %   MCV 90.9 80.0 - 100.0 fL   MCH 29.9 26.0 - 34.0 pg   MCHC 32.8 30.0 - 36.0 g/dL   RDW 86.6 88.4 - 84.4 %   Platelets 195 150 - 400 K/uL   nRBC 0.0 0.0 - 0.2 %   Neutrophils Relative % 98 %   Neutro Abs 15.6 (H) 1.7 - 7.7 K/uL   Lymphocytes Relative 2 %   Lymphs Abs 0.4 (L) 0.7 - 4.0 K/uL   Monocytes Relative 0 %   Monocytes Absolute 0.1 0.1 - 1.0 K/uL   Eosinophils Relative 0 %   Eosinophils Absolute 0.0 0.0 - 0.5 K/uL   Basophils Relative 0 %   Basophils Absolute 0.0 0.0 - 0.1 K/uL   Immature  Granulocytes 0 %   Abs Immature Granulocytes 0.05 0.00 - 0.07 K/uL    Comment: Performed at [facility], [address] [place], [place] [address]  Comprehensive metabolic panel     Status: Abnormal   Collection Time: 05/22/24  8:29 PM  Result Value Ref Range   Sodium 139 135 - 145 mmol/L   Potassium 4.5 3.5 - 5.1 mmol/L   Chloride 105 98 - 111 mmol/L   CO2 18 (L) 22 - 32 mmol/L   Glucose, Bld 150 (H) 70 - 99 mg/dL    Comment: Glucose reference range applies only to samples taken after fasting for at least 8 hours.   BUN 26 (H) 8 - 23 mg/dL   Creatinine, Ser 8.87 0.61 - 1.24 mg/dL   Calcium  9.6 8.9 - 10.3 mg/dL   Total Protein 8.0 6.5 - 8.1 g/dL   Albumin 4.0 3.5 - 5.0 g/dL   AST 29 15 - 41 U/L   ALT 28 0 - 44 U/L   Alkaline Phosphatase 118 38 - 126 U/L   Total Bilirubin 0.6 0.0 - 1.2 mg/dL   GFR, Estimated >39 >39 mL/min    Comment: (NOTE) Calculated using the CKD-EPI Creatinine Equation (2021)    Anion gap 17 (H) 5 - 15    Comment: Performed at [facility], [address], [place], [place] [address]  Lipase, blood     Status: Abnormal   Collection Time: 05/22/24  8:29 PM  Result Value Ref Range   Lipase <10 (L) 11 - 51 U/L    Comment: Performed at [facility], [address] [place], [place] [address]  CBG monitoring, ED     Status: Abnormal   Collection Time: 05/23/24  2:07 AM  Result Value Ref Range   Glucose-Capillary 140 (H) 70 - 99 mg/dL    Comment: Glucose reference range applies only to samples taken after fasting for at least 8 hours.    CT Angio Chest PE W and/or Wo Contrast Result Date: 05/22/2024 EXAM: CTA CHEST PE WITHOUT AND WITH CONTRAST CT ABDOMEN AND PELVIS WITHOUT AND WITH CONTRAST 05/22/2024 10:47:02 PM TECHNIQUE: CTA of the chest was performed after the administration of 75 mL of intravenous contrast (iohexol  (OMNIPAQUE ) 350 MG/ML injection). Multiplanar reformatted images are provided for review. MIP images are provided for review.  CT of the abdomen and pelvis was performed with and without the administration of intravenous contrast. Automated exposure control, iterative reconstruction, and/or weight based adjustment of the mA/kV was utilized to reduce the radiation dose to as low as reasonably achievable. COMPARISON: Findings were compared to prior studies from 2022 and 02/11/2022. CLINICAL HISTORY: Pulmonary embolism (PE) high prob. Nephrolithiasis, abdominal  and flank pain, hyperbilirubinemia, pulmonary embolism. FINDINGS: CHEST: PULMONARY ARTERIES: Pulmonary arteries are adequately opacified for evaluation. No intraluminal filling defect to suggest pulmonary embolism. Main pulmonary artery is normal in caliber. MEDIASTINUM: Extensive multivessel coronary artery calcification. The heart and pericardium demonstrate no acute abnormality. 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 The descending thoracic aorta is of normal caliber. No mediastinal lymphadenopathy. LUNGS AND PLEURA: Large hiatal hernia containing the entire stomach which demonstrates organoaxial gastric volvulus, similar to prior examination, without obstruction. Resultant marked elevation of the left hemidiaphragm, left basilar atelectasis and left-sided volume loss. Mild right basilar atelectasis. No pleural effusion or pneumothorax. SOFT TISSUES AND BONES: Osseous structures are age appropriate. No acute soft tissue abnormality. ABDOMEN AND PELVIS: LIVER: The liver is unremarkable. GALLBLADDER AND BILE DUCTS: Status post cholecystectomy. No biliary ductal dilatation. SPLEEN: Spleen demonstrates no acute abnormality. PANCREAS: Moderate fatty atrophy of the pancreas. There is increasing herniation of the proximal tail of the pancreas into the thoracic compartment. No evidence of superimposed inflammatory change, however. ADRENAL GLANDS: Adrenal glands demonstrate no  acute abnormality. KIDNEYS, URETERS AND BLADDER: Mild asymmetric right renal cortical atrophy. Simple cortical cyst arises from the lower pole of the right kidney. No follow up imaging is recommended. Bilateral nonobstructing renal calculi are noted within the lower pole of the right kidney and within the renal pelvis bilaterally measuring up to 14 mm in the left renal pelvis and 16 mm within the right renal pelvis. No hydronephrosis. No ureteral calculi on the right. Nonobstructing 4 mm calculus within the mid left ureter at the level of the crossing iliac vasculature. 27 mm calculus noted dependently within the bladder lumen. 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 The bladder is not distended. 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 No perinephric or periureteral stranding. GI AND BOWEL: Large right inguinal hernia contains the terminal ileum which appears unremarkable. Moderate fat-containing left inguinal hernia. No evidence of obstruction or focal inflammation. Status post appendectomy. The duodenal sweep demonstrates no acute abnormality. There is no bowel obstruction. No abnormal bowel wall thickening or distension. 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 PERITONEUM AND RETROPERITONEUM: No ascites or free air. LYMPH NODES: No lymphadenopathy. BONES AND SOFT TISSUES: Subacute to remote-appearing anterior wedge compression deformity of L3 with 50% loss of height, new from prior examination. Osseous structures are otherwise age appropriate. Moderate aortoiliac atherosclerotic calcification. No focal soft tissue abnormality. LIMITATIONS/ARTIFACTS:  Imaging is limited by respiratory motion artifact. IMPRESSION: 1. No pulmonary embolism. 2. 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 3. 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 Recommend annual imaging follow-up by CTA or mra. This recommendation follows 2010 accf/aha/aats/ACR/asa/sca/scai/sir/sts/svm guidelines for the diagnosis and management of patients with thoracic aortic disease. Circulation. 2010; 121: Z733-z630. Aortic aneurysm nos (icd10-i71.9) 4. Bilateral nonobstructing renal calculi, with a 4 mm nonobstructing calculus in the mid left ureter and a 27 mm calculus in the bladder lumen. 5. 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 6. Large right inguinal hernia containing the terminal ileum  and moderate fat-containing left inguinal hernia, without obstruction or focal inflammation. 7. Subacute to remote-appearing anterior wedge compression deformity of L3 with 50% loss of height, new from prior examination. Correlation with clinical exam is recommended. If indicated, MRI examination will be more helpful to better age the fracture. 8. Extensive multivessel coronary artery calcification. 9. Status post cholecystectomy and appendectomy. Electronically signed by: Dorethia Molt MD 05/22/2024 11:39 PM EST RP Workstation: [id number]   CT ABDOMEN PELVIS W CONTRAST Result Date: 05/22/2024 EXAM: CTA CHEST PE WITHOUT AND WITH CONTRAST CT ABDOMEN AND PELVIS WITHOUT AND WITH CONTRAST 05/22/2024 10:47:02 PM TECHNIQUE: CTA of the chest was performed after the administration of 75 mL of intravenous contrast (iohexol  (OMNIPAQUE ) 350 MG/ML injection). Multiplanar reformatted images are provided for review. MIP images are provided for review. CT of the abdomen and pelvis was performed with and without  the administration of intravenous contrast. Automated exposure control, iterative reconstruction, and/or weight based adjustment of the mA/kV was utilized to reduce the radiation dose to as low as reasonably achievable. COMPARISON: Findings were compared to prior studies from 2022 and 02/11/2022. CLINICAL HISTORY: Pulmonary embolism (PE) high prob. Nephrolithiasis, abdominal and flank pain, hyperbilirubinemia, pulmonary embolism. FINDINGS: CHEST: PULMONARY ARTERIES: Pulmonary arteries are adequately opacified for evaluation. No intraluminal filling defect to suggest pulmonary embolism. Main pulmonary artery is normal in caliber. MEDIASTINUM: Extensive multivessel coronary artery calcification. The heart and pericardium demonstrate no acute abnormality. 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 The descending thoracic aorta is of normal caliber. No mediastinal lymphadenopathy. LUNGS AND PLEURA: Large hiatal hernia containing the entire stomach which demonstrates organoaxial gastric volvulus, similar to prior examination, without obstruction. Resultant marked elevation of the left hemidiaphragm, left basilar atelectasis and left-sided volume loss. Mild right basilar atelectasis. No pleural effusion or pneumothorax. SOFT TISSUES AND BONES: Osseous structures are age appropriate. No acute soft tissue abnormality. ABDOMEN AND PELVIS: LIVER: The liver is unremarkable. GALLBLADDER AND BILE DUCTS: Status post cholecystectomy. No biliary ductal dilatation. SPLEEN: Spleen demonstrates no acute abnormality. PANCREAS: Moderate fatty atrophy of the pancreas. There is increasing herniation of the proximal tail of the pancreas into the thoracic compartment. No evidence of superimposed inflammatory change, however. ADRENAL GLANDS: Adrenal glands demonstrate no acute abnormality. KIDNEYS, URETERS AND BLADDER: Mild  asymmetric right renal cortical atrophy. Simple cortical cyst arises from the lower pole of the right kidney. No follow up imaging is recommended. Bilateral nonobstructing renal calculi are noted within the lower pole of the right kidney and within the renal pelvis bilaterally measuring up to 14 mm in the left renal pelvis and 16 mm within the right renal pelvis. No hydronephrosis. No ureteral calculi on the right. Nonobstructing 4 mm calculus within the mid left ureter at the level of the crossing iliac vasculature. 27 mm calculus noted dependently within the bladder lumen. 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 The bladder is not distended. 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 No perinephric or periureteral stranding. GI AND BOWEL: Large right inguinal hernia contains the terminal ileum which appears unremarkable. Moderate fat-containing left inguinal hernia. No evidence of obstruction or focal inflammation. Status post appendectomy. The duodenal sweep demonstrates no acute abnormality. There is no bowel obstruction. No abnormal bowel wall thickening or distension.  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 PERITONEUM AND RETROPERITONEUM: No ascites or free air. LYMPH NODES: No lymphadenopathy. BONES AND SOFT TISSUES: Subacute to remote-appearing anterior wedge compression deformity of L3 with 50% loss of height, new from prior examination. Osseous structures are otherwise age appropriate. Moderate aortoiliac atherosclerotic calcification. No focal soft tissue abnormality. LIMITATIONS/ARTIFACTS: Imaging is limited by respiratory motion artifact.  IMPRESSION: 1. No pulmonary embolism. 2. 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 3. 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 Recommend annual imaging follow-up by CTA or mra. This recommendation follows 2010 accf/aha/aats/ACR/asa/sca/scai/sir/sts/svm guidelines for the diagnosis and management of patients with thoracic aortic disease. Circulation. 2010; 121: [id number]. Aortic aneurysm nos (icd10-i71.9) 4. Bilateral nonobstructing renal calculi, with a 4 mm nonobstructing calculus in the mid left ureter and a 27 mm calculus in the bladder lumen. 5. 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 6. Large right inguinal hernia containing the terminal ileum and moderate fat-containing left inguinal hernia, without obstruction or focal inflammation. 7. Subacute to remote-appearing anterior wedge compression deformity of L3 with 50% loss of height, new from prior examination. Correlation with clinical exam is recommended. If indicated, MRI examination will be more helpful to better age the fracture. 8. Extensive multivessel coronary artery calcification. 9. Status post cholecystectomy and appendectomy. Electronically signed by: Dorethia Molt MD 05/22/2024 11:39 PM EST RP Workstation: [id number]   DG Chest Port 1 View Result Date: 05/22/2024 EXAM: 1 VIEW(S) XRAY OF THE CHEST 05/22/2024 08:51:07 PM COMPARISON: None available. CLINICAL HISTORY: Back pain. SOB (shortness of breath). FINDINGS: LUNGS AND PLEURA: Low lung volumes accentuate pulmonary vascularity. Elevated left hemidiaphragm. Left basilar atelectasis. No pleural effusion. No pneumothorax. HEART AND MEDIASTINUM: Low lung volumes accentuate the cardiomediastinal silhouette. Aortic atherosclerotic calcification is present. BONES AND SOFT TISSUES:  Gaseous gastric distension. No acute osseous abnormality. IMPRESSION: 1. No acute cardiopulmonary abnormality. 2. Low lung volumes with left basilar atelectasis. 3. Large hiatal hernia with gaseous distention of the intrathoric stomach. Please correlate clinically. Electronically signed by: Greig Pique MD 05/22/2024 08:56 PM EST RP Workstation: [id number]    ROS - unable to obtain 2/2 condition of the patient  PE Blood pressure 106/75, pulse (!) 107, temperature (!) 101.8 F (38.8 C), temperature source Rectal, resp. rate 20, height 5' 6 (1.676 m), weight 78.4 kg, SpO2 95%. Constitutional: eyes open, incomprehensible sounds Eyes: Moist conjunctiva; no lid lag; anicteric; PERRL Neck: Trachea midline; no thyromegaly Lungs: Normal respiratory effort; no tactile fremitus CV: RRR; no palpable thrills; no pitting edema GI: Abd soft, nontender, nondistended; no palpable hepatosplenomegaly FDX:rymnwpr contractures of left extremities; no clubbing/cyanosis Psychiatric: unable to assess Lymphatic: No palpable cervical or axillary lymphadenopathy  Results for orders placed or performed during the hospital encounter of 05/22/24 (from the past 48 hours)  CBC with Differential     Status: Abnormal   Collection Time: 05/22/24  8:29 PM  Result Value Ref Range   WBC 16.2 (H) 4.0 - 10.5 K/uL   RBC 5.29 4.22 - 5.81 MIL/uL   Hemoglobin 15.8 13.0 - 17.0 g/dL   HCT 51.8 60.9 - 47.9 %   MCV 90.9 80.0 - 100.0 fL   MCH 29.9 26.0 - 34.0 pg   MCHC 32.8 30.0 - 36.0 g/dL   RDW 86.6 88.4 - 84.4 %  Platelets 195 150 - 400 K/uL   nRBC 0.0 0.0 - 0.2 %   Neutrophils Relative % 98 %   Neutro Abs 15.6 (H) 1.7 - 7.7 K/uL   Lymphocytes Relative 2 %   Lymphs Abs 0.4 (L) 0.7 - 4.0 K/uL   Monocytes Relative 0 %   Monocytes Absolute 0.1 0.1 - 1.0 K/uL   Eosinophils Relative 0 %   Eosinophils Absolute 0.0 0.0 - 0.5 K/uL   Basophils Relative 0 %   Basophils Absolute 0.0 0.0 - 0.1 K/uL   Immature Granulocytes 0 %    Abs Immature Granulocytes 0.05 0.00 - 0.07 K/uL    Comment: Performed at [facility], [address] [place], [place] [address]  Comprehensive metabolic panel     Status: Abnormal   Collection Time: 05/22/24  8:29 PM  Result Value Ref Range   Sodium 139 135 - 145 mmol/L   Potassium 4.5 3.5 - 5.1 mmol/L   Chloride 105 98 - 111 mmol/L   CO2 18 (L) 22 - 32 mmol/L   Glucose, Bld 150 (H) 70 - 99 mg/dL    Comment: Glucose reference range applies only to samples taken after fasting for at least 8 hours.   BUN 26 (H) 8 - 23 mg/dL   Creatinine, Ser 8.87 0.61 - 1.24 mg/dL   Calcium  9.6 8.9 - 10.3 mg/dL   Total Protein 8.0 6.5 - 8.1 g/dL   Albumin 4.0 3.5 - 5.0 g/dL   AST 29 15 - 41 U/L   ALT 28 0 - 44 U/L   Alkaline Phosphatase 118 38 - 126 U/L   Total Bilirubin 0.6 0.0 - 1.2 mg/dL   GFR, Estimated >39 >39 mL/min    Comment: (NOTE) Calculated using the CKD-EPI Creatinine Equation (2021)    Anion gap 17 (H) 5 - 15    Comment: Performed at [facility], [address], [place], [place] [address]  Lipase, blood     Status: Abnormal   Collection Time: 05/22/24  8:29 PM  Result Value Ref Range   Lipase <10 (L) 11 - 51 U/L    Comment: Performed at [facility], [address], [place], [place] [address]  CBG monitoring, ED     Status: Abnormal   Collection Time: 05/23/24  2:07 AM  Result Value Ref Range   Glucose-Capillary 140 (H) 70 - 99 mg/dL    Comment: Glucose reference range applies only to samples taken after fasting for at least 8 hours.    CT Angio Chest PE W and/or Wo Contrast Result Date: 05/22/2024 EXAM: CTA CHEST PE WITHOUT AND WITH CONTRAST CT ABDOMEN AND PELVIS WITHOUT AND WITH CONTRAST 05/22/2024 10:47:02 PM TECHNIQUE: CTA of the chest was performed after the administration of 75 mL of intravenous contrast (iohexol  (OMNIPAQUE ) 350 MG/ML injection). Multiplanar reformatted images are provided for review. MIP images are provided for review. CT of the abdomen and  pelvis was performed with and without the administration of intravenous contrast. Automated exposure control, iterative reconstruction, and/or weight based adjustment of the mA/kV was utilized to reduce the radiation dose to as low as reasonably achievable. COMPARISON: Findings were compared to prior studies from 2022 and 02/11/2022. CLINICAL HISTORY: Pulmonary embolism (PE) high prob. Nephrolithiasis, abdominal and flank pain, hyperbilirubinemia, pulmonary embolism. FINDINGS: CHEST: PULMONARY ARTERIES: Pulmonary arteries are adequately opacified for evaluation. No intraluminal filling defect to suggest pulmonary embolism. Main pulmonary artery is normal in caliber. MEDIASTINUM: Extensive multivessel coronary artery calcification.  The heart and pericardium demonstrate no acute abnormality. 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 The descending thoracic aorta is of normal caliber. No mediastinal lymphadenopathy. LUNGS AND PLEURA: Large hiatal hernia containing the entire stomach which demonstrates organoaxial gastric volvulus, similar to prior examination, without obstruction. Resultant marked elevation of the left hemidiaphragm, left basilar atelectasis and left-sided volume loss. Mild right basilar atelectasis. No pleural effusion or pneumothorax. SOFT TISSUES AND BONES: Osseous structures are age appropriate. No acute soft tissue abnormality. ABDOMEN AND PELVIS: LIVER: The liver is unremarkable. GALLBLADDER AND BILE DUCTS: Status post cholecystectomy. No biliary ductal dilatation. SPLEEN: Spleen demonstrates no acute abnormality. PANCREAS: Moderate fatty atrophy of the pancreas. There is increasing herniation of the proximal tail of the pancreas into the thoracic compartment. No evidence of superimposed inflammatory change, however. ADRENAL GLANDS: Adrenal glands demonstrate no acute abnormality.  KIDNEYS, URETERS AND BLADDER: Mild asymmetric right renal cortical atrophy. Simple cortical cyst arises from the lower pole of the right kidney. No follow up imaging is recommended. Bilateral nonobstructing renal calculi are noted within the lower pole of the right kidney and within the renal pelvis bilaterally measuring up to 14 mm in the left renal pelvis and 16 mm within the right renal pelvis. No hydronephrosis. No ureteral calculi on the right. Nonobstructing 4 mm calculus within the mid left ureter at the level of the crossing iliac vasculature. 27 mm calculus noted dependently within the bladder lumen. 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 The bladder is not distended. 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 No perinephric or periureteral stranding. GI AND BOWEL: Large right inguinal hernia contains the terminal ileum which appears unremarkable. Moderate fat-containing left inguinal hernia. No evidence of obstruction or focal inflammation. Status post appendectomy. The duodenal sweep demonstrates no acute abnormality. There is no bowel obstruction. No abnormal bowel wall thickening or distension. 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 PERITONEUM AND RETROPERITONEUM: No ascites or free air. LYMPH NODES: No lymphadenopathy. BONES AND SOFT TISSUES: Subacute to remote-appearing anterior wedge compression deformity of L3 with 50% loss of height, new from prior examination. Osseous structures are otherwise age appropriate. Moderate aortoiliac atherosclerotic calcification. No focal soft tissue abnormality. LIMITATIONS/ARTIFACTS: Imaging is limited by  respiratory motion artifact. IMPRESSION: 1. No pulmonary embolism. 2. 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 3. 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 Recommend annual imaging follow-up by CTA or mra. This recommendation follows 2010 accf/aha/aats/ACR/asa/sca/scai/sir/sts/svm guidelines for the diagnosis and management of patients with thoracic aortic disease. Circulation. 2010; 121: Z733-z630. Aortic aneurysm nos (icd10-i71.9) 4. Bilateral nonobstructing renal calculi, with a 4 mm nonobstructing calculus in the mid left ureter and a 27 mm calculus in the bladder lumen. 5. 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 6. Large right inguinal hernia containing the terminal ileum and moderate fat-containing left inguinal hernia, without obstruction or focal inflammation. 7. Subacute to remote-appearing anterior wedge compression deformity of L3 with 50% loss of height, new from prior examination. Correlation with clinical exam is recommended. If indicated,  MRI examination will be more helpful to better age the fracture. 8. Extensive multivessel coronary artery calcification. 9. Status post cholecystectomy and appendectomy. Electronically signed by: Dorethia Molt MD 05/22/2024 11:39 PM EST RP Workstation: [id number]   CT ABDOMEN PELVIS W CONTRAST Result Date: 05/22/2024 EXAM: CTA CHEST PE WITHOUT AND WITH CONTRAST CT ABDOMEN AND PELVIS WITHOUT AND WITH CONTRAST 05/22/2024 10:47:02 PM TECHNIQUE: CTA of the chest was performed after the administration of 75 mL of intravenous contrast (iohexol  (OMNIPAQUE ) 350 MG/ML injection). Multiplanar reformatted images are provided for review. MIP images are provided for review. CT of the abdomen and pelvis was performed with and without the administration of  intravenous contrast. Automated exposure control, iterative reconstruction, and/or weight based adjustment of the mA/kV was utilized to reduce the radiation dose to as low as reasonably achievable. COMPARISON: Findings were compared to prior studies from 2022 and 02/11/2022. CLINICAL HISTORY: Pulmonary embolism (PE) high prob. Nephrolithiasis, abdominal and flank pain, hyperbilirubinemia, pulmonary embolism. FINDINGS: CHEST: PULMONARY ARTERIES: Pulmonary arteries are adequately opacified for evaluation. No intraluminal filling defect to suggest pulmonary embolism. Main pulmonary artery is normal in caliber. MEDIASTINUM: Extensive multivessel coronary artery calcification. The heart and pericardium demonstrate no acute abnormality. 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 The descending thoracic aorta is of normal caliber. No mediastinal lymphadenopathy. LUNGS AND PLEURA: Large hiatal hernia containing the entire stomach which demonstrates organoaxial gastric volvulus, similar to prior examination, without obstruction. Resultant marked elevation of the left hemidiaphragm, left basilar atelectasis and left-sided volume loss. Mild right basilar atelectasis. No pleural effusion or pneumothorax. SOFT TISSUES AND BONES: Osseous structures are age appropriate. No acute soft tissue abnormality. ABDOMEN AND PELVIS: LIVER: The liver is unremarkable. GALLBLADDER AND BILE DUCTS: Status post cholecystectomy. No biliary ductal dilatation. SPLEEN: Spleen demonstrates no acute abnormality. PANCREAS: Moderate fatty atrophy of the pancreas. There is increasing herniation of the proximal tail of the pancreas into the thoracic compartment. No evidence of superimposed inflammatory change, however. ADRENAL GLANDS: Adrenal glands demonstrate no acute abnormality. KIDNEYS, URETERS AND BLADDER: Mild asymmetric right renal  cortical atrophy. Simple cortical cyst arises from the lower pole of the right kidney. No follow up imaging is recommended. Bilateral nonobstructing renal calculi are noted within the lower pole of the right kidney and within the renal pelvis bilaterally measuring up to 14 mm in the left renal pelvis and 16 mm within the right renal pelvis. No hydronephrosis. No ureteral calculi on the right. Nonobstructing 4 mm calculus within the mid left ureter at the level of the crossing iliac vasculature. 27 mm calculus noted dependently within the bladder lumen. 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 The bladder is not distended. 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 No perinephric or periureteral stranding. GI AND BOWEL: Large right inguinal hernia contains the terminal ileum which appears unremarkable. Moderate fat-containing left inguinal hernia. No evidence of obstruction or focal inflammation. Status post appendectomy. The duodenal sweep demonstrates no acute abnormality. There is no bowel obstruction. No abnormal bowel wall thickening or distension. 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 PERITONEUM AND RETROPERITONEUM: No ascites or free air. LYMPH NODES: No lymphadenopathy. BONES AND SOFT TISSUES:  Subacute to remote-appearing anterior wedge compression deformity of L3 with 50% loss of height, new from prior examination. Osseous structures are otherwise age appropriate. Moderate aortoiliac atherosclerotic calcification. No focal soft tissue abnormality. LIMITATIONS/ARTIFACTS: Imaging is limited by respiratory motion artifact. IMPRESSION: 1. No pulmonary  embolism. 2. 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 3. 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 Recommend annual imaging follow-up by CTA or mra. This recommendation follows 2010 accf/aha/aats/ACR/asa/sca/scai/sir/sts/svm guidelines for the diagnosis and management of patients with thoracic aortic disease. Circulation. 2010; 121: [id number]. Aortic aneurysm nos (icd10-i71.9) 4. Bilateral nonobstructing renal calculi, with a 4 mm nonobstructing calculus in the mid left ureter and a 27 mm calculus in the bladder lumen. 5. 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 6. Large right inguinal hernia containing the terminal ileum and moderate fat-containing left inguinal hernia, without obstruction or focal inflammation. 7. Subacute to remote-appearing anterior wedge compression deformity of L3 with 50% loss of height, new from prior examination. Correlation with clinical exam is recommended. If indicated, MRI examination will be more helpful to better age the fracture. 8. Extensive multivessel coronary artery calcification. 9. Status post cholecystectomy and appendectomy. Electronically signed by: Dorethia Molt MD 05/22/2024 11:39 PM EST RP Workstation: [id number]   DG Chest Port 1 View Result Date: 05/22/2024 EXAM: 1 VIEW(S) XRAY OF THE CHEST 05/22/2024 08:51:07 PM COMPARISON: None available. CLINICAL HISTORY: Back pain. SOB (shortness of breath). FINDINGS: LUNGS AND PLEURA: Low lung volumes accentuate pulmonary vascularity. Elevated left hemidiaphragm. Left basilar atelectasis. No pleural effusion. No pneumothorax. HEART AND MEDIASTINUM: Low lung volumes accentuate the cardiomediastinal silhouette. Aortic atherosclerotic calcification is present. BONES AND SOFT TISSUES: Gaseous gastric distension.  No acute osseous abnormality. IMPRESSION: 1. No acute cardiopulmonary abnormality. 2. Low lung volumes with left basilar atelectasis. 3. Large hiatal hernia with gaseous distention of the intrathoric stomach. Please correlate clinically. Electronically signed by: Greig Pique MD 05/22/2024 08:56 PM EST RP Workstation: [id number]    A/P: Gregory Kerr is an 84 y.o. male with with hx of CVA (L sided paresis) HTN, GERD with large hiatal hernia with organoaxial gastric volvulus without obstruction  - CT findings appear stable relative to 2023. There does not appear to be obstruction of his stomach in his hiatal hernia and contrast moves through. If intractable n/v, can place NG, but on discussion with Dr. Midge, he has not had this at all. He was being evaluated for potential UTI at this point based on his initial flank pain. - We will follow with you pending further workup  I spent a total of 60 minutes in both face-to-face and non-face-to-face activities, excluding procedures performed, for this visit on the date of this encounter.   Lonni Pizza, MD [facility]     [1]  Allergies Allergen Reactions   Penicillins Rash    ++++Patient in nursing home. Unable to clarify details/severity of the reaction.++++   "

## 2024-05-23 NOTE — ED Provider Notes (Signed)
 I assumed care of patient at signout.  Plan was to follow-up with CT imaging No PE, patient has large gastric volvulus. I have discussed the case with Dr. Teresa with general surgery.  He has reviewed the case, it appears this is a chronic finding.  No acute intervention unless he is vomiting  Patient is now febrile and appears confused.  He does have ureteral stones.  Will need to obtain urinalysis as he may have infected stone Patient somnolent but arousable but did just receive pain medicine    Midge Golas, MD 05/23/24 [phone]

## 2024-05-23 NOTE — Anesthesia Preprocedure Evaluation (Signed)
"                                    Anesthesia Evaluation  Patient identified by MRN, date of birth, ID band Patient awake    Reviewed: Allergy & Precautions, H&P , NPO status , Patient's Chart, lab work & pertinent test results  Airway Mallampati: II   Neck ROM: full    Dental   Pulmonary COPD, former smoker   breath sounds clear to auscultation       Cardiovascular hypertension, + CAD and +CHF   Rhythm:regular Rate:Normal     Neuro/Psych  PSYCHIATRIC DISORDERS  Depression     Neuromuscular disease CVA    GI/Hepatic hiatal hernia,GERD  ,,  Endo/Other    Renal/GU Renal diseasestones     Musculoskeletal  (+) Arthritis ,    Abdominal   Peds  Hematology   Anesthesia Other Findings   Reproductive/Obstetrics                              Anesthesia Physical Anesthesia Plan  ASA: 3  Anesthesia Plan: General   Post-op Pain Management:    Induction: Intravenous  PONV Risk Score and Plan: 2 and Ondansetron , Dexamethasone  and Treatment may vary due to age or medical condition  Airway Management Planned: Oral ETT  Additional Equipment:   Intra-op Plan:   Post-operative Plan: Extubation in OR  Informed Consent: I have reviewed the patients History and Physical, chart, labs and discussed the procedure including the risks, benefits and alternatives for the proposed anesthesia with the patient or authorized representative who has indicated his/her understanding and acceptance.     Dental advisory given and Consent reviewed with POA  Plan Discussed with: CRNA, Anesthesiologist and Surgeon  Anesthesia Plan Comments:         Anesthesia Quick Evaluation  "

## 2024-05-23 NOTE — Anesthesia Procedure Notes (Signed)
 Procedure Name: Intubation Date/Time: 05/23/2024 5:46 AM  Performed by: Jama Powell NOVAK, CRNAPre-anesthesia Checklist: Patient identified, Timeout performed, Emergency Drugs available, Suction available and Patient being monitored Patient Re-evaluated:Patient Re-evaluated prior to induction Oxygen Delivery Method: Circle system utilized Preoxygenation: Pre-oxygenation with 100% oxygen Induction Type: IV induction Ventilation: Mask ventilation without difficulty and Oral airway inserted - appropriate to patient size Laryngoscope Size: Mac, 4 and Glidescope Tube size: 7.5 mm Number of attempts: 2 Airway Equipment and Method: Stylet and Video-laryngoscopy Placement Confirmation: ETT inserted through vocal cords under direct vision, positive ETCO2, CO2 detector and breath sounds checked- equal and bilateral Secured at: 23 cm Tube secured with: Tape Dental Injury: Teeth and Oropharynx as per pre-operative assessment  Difficulty Due To: Difficulty was unanticipated and Difficult Airway- due to reduced neck mobility

## 2024-05-23 NOTE — Sepsis Progress Note (Signed)
 Elink monitoring for the code sepsis protocol.

## 2024-05-23 NOTE — ED Provider Notes (Addendum)
 Discussed with ddr pace with urology. She has reviewed the workup and imaging.  She will take patient to the OR for bilateral stents. Patient likely has infected stone He has been given IV fluids and antibiotics. We will also place a catheter.  I spoke to his POA [name] She has been informed of his care. Patient is a DNR, but would agreed to IV fluids, antibiotics and surgery   Midge Golas, MD 05/23/24 [id number]    Midge Golas, MD 05/23/24 [phone]

## 2024-05-23 NOTE — H&P (Signed)
 " History and Physical    Patient: Gregory Kerr FMW:[id number] DOB: Apr 21, 1941 DOA: 05/22/2024 DOS: the patient was seen and examined on 05/23/2024 PCP: Donnella Agreste  Patient coming from: Transfer from [facility] Complaint:  Chief Complaint  Patient presents with   Back Pain   HPI: Gregory Kerr is a 84 y.o. male with medical history significant of hypertension, hyperlipidemia, diastolic congestive heart failure, CVA with residual deficits, MDR UTIs, BPH presented with left sided flank pain.  History is somewhat limited at this time due to patient being lethargic out of surgery.  Symptoms started yesterday afternoon in the left side and low back that had progressively gotten worse.  Denied having any chest pain shortness of breath, nausea, or vomiting.  He has a history of frequent UTIs for which he is on trimethoprim  for prophylaxis.  En route with EMS patient had been given 100 mcg of fentanyl .  In the emergency department patient was noted to be febrile up to 101.8 F with heart rates elevated up to 119, respirations 24, blood pressures 103/63 to 176/92, O2 saturations currently maintained on 2 L nasal cannula oxygen.  Labs from 1/1 were significant for WBC 16.2, CO2 18, BUN 26, creatinine 1.12, anion gap 17, and lactic acid 4.5.  Urinalysis noted large hemoglobin, large leukocytes, positive nitrites, many bacteria, greater than 50 RBC/hpf, and 11-20 WBCs.  CT angiogram of the chest with CT of the abdomen and pelvis with contrast have been obtained.  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  Blood and urine cultures were obtained.  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  Urology had been consulted and plan to take patient to the OR for cystoscopy with placement of bilateral ureteral stents.  Patient had been ordered acetaminophen  650 mg suppository, 3.5 L of IV fluids, Rocephin , IV for pain.  Review of Systems: As mentioned in the history of present illness. All other systems reviewed and are negative. 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   Pneumonia    once or twice (09/22/2014)   Retinal detachment    left eye   Stroke [facility]) 2007   left side doesn't work now (09/22/2014)   Past Surgical History:  Procedure Laterality Date   APPENDECTOMY  05/27/2009   Jeoffrey Dawn, MD   CATARACT EXTRACTION W/ INTRAOCULAR LENS  IMPLANT, BILATERAL Bilateral ?2014   CHOLECYSTECTOMY N/A 09/26/2014   Procedure: LAPAROSCOPIC CHOLECYSTECTOMY WITH INTRAOPERATIVE CHOLANGIOGRAM;  Surgeon: Donnice Lima, MD;  Location: [facility] OR;  Service: General;  Laterality: N/A;   COLONOSCOPY WITH PROPOFOL  N/A 12/29/2015  Procedure: COLONOSCOPY WITH PROPOFOL ;  Surgeon: Jerrell Sol, MD;  Location: [facility] ENDOSCOPY;  Service: Endoscopy;  Laterality: N/A;   ERCP N/A 09/25/2014   Procedure: ENDOSCOPIC RETROGRADE CHOLANGIOPANCREATOGRAPHY (ERCP);  Surgeon: Oliva Boots, MD;  Location: [facility]  ENDOSCOPY;  Service: Endoscopy;  Laterality: N/A;   IR CATHETER TUBE CHANGE  05/24/2021   PARS PLANA VITRECTOMY Left 01/30/2019   Procedure: PARS PLANA VITRECTOMY WITH 25 GAUGE;  Surgeon: Jarold Mayo, MD;  Location: [facility] OR;  Service: Ophthalmology;  Laterality: Left;   PARS PLANA VITRECTOMY Right 11/13/2019   Procedure: PARS PLANA VITRECTOMY WITH 25 GAUGE WITH REMOVAL OF POSTERIOR CAPSULAR OPACIFICATION WITH VITRECTOR;  Surgeon: Jarold Mayo, MD;  [facility] OR;  Service: Ophthalmology;  Laterality: Right;   PHOTOCOAGULATION WITH LASER Left 01/30/2019   Procedure: PHOTOCOAGULATION WITH LASER;  Surgeon: Jarold Mayo, MD;  Location: [facility] OR;  Service: Ophthalmology;  Laterality: Left;   Social History:  reports that he quit smoking about 18 years ago. His smoking use included cigarettes. He smoked an average of 1 pack per day. He has never used smokeless tobacco. He reports that he does not currently use alcohol . He reports that he does not use drugs.  Allergies[1]  Family History  Problem Relation Age of Onset   Heart attack Mother    Heart attack Father    Cancer Maternal Aunt        Stomach   Cancer Paternal Wylie Kennyth Proud    Prior to Admission medications  Medication Sig Start Date End Date Taking? Authorizing Provider  acetaminophen  (TYLENOL ) 650 MG CR tablet Take 650 mg by mouth in the morning and at bedtime.   Yes [provider]  albuterol  (VENTOLIN  HFA) 108 (90 Base) MCG/ACT inhaler Inhale 1-2 puffs into the lungs every 6 (six) hours as needed for wheezing or shortness of breath. 07/15/22  Yes Prosperi, Christian H, PA-C  Amino Acids-Protein Hydrolys (PRO-STAT AWC) LIQD Take 30 mLs by mouth 2 (two) times daily with a meal.   Yes [provider]  atorvastatin  (LIPITOR) 40 MG tablet Take 1 tablet (40 mg total) by mouth daily. Patient taking differently: Take 40 mg by mouth every evening. 11/22/20  Yes Golda Lynwood PARAS, MD  baclofen  (LIORESAL ) 20 MG tablet  Take 20 mg by mouth 3 (three) times daily.   Yes [provider]  bisacodyl  (DULCOLAX) 10 MG suppository Place 10 mg rectally daily as needed for moderate constipation. 10/18/21  Yes [provider]  calcium -vitamin D  (OSCAL WITH D) 500-5 MG-MCG tablet Take 1 tablet by mouth 2 (two) times daily.   Yes [provider]  clopidogrel  (PLAVIX ) 75 MG tablet Take 1 tablet (75 mg total) by mouth daily. 11/22/20  Yes Golda Lynwood PARAS, MD  clotrimazole-betamethasone (LOTRISONE) cream Apply 1 Application topically daily. 05/20/24  Yes [provider]  Cranberry 450 MG TABS Take 450 mg by mouth daily.   Yes [provider]  Dextromethorphan -quiNIDine (NUEDEXTA ) 20-10 MG capsule Take 1 capsule by mouth at bedtime. For Pseudobulbar Affect (PBA)   Yes [provider]  gabapentin  (NEURONTIN ) 100 MG capsule Take 200 mg by mouth 3 (three) times daily.   Yes [provider]  gentamicin ointment (GARAMYCIN) 0.1 % Apply 1 Application topically daily.   Yes [provider]  HYDROcodone -acetaminophen  (NORCO/VICODIN) 5-325 MG tablet Take 1 tablet by mouth in the morning, at noon, and at bedtime.   Yes [provider]  loperamide (IMODIUM A-D) 2 MG tablet  Take 4 mg by mouth 4 (four) times daily as needed for diarrhea or loose stools.   Yes [provider]  loratadine  (CLARITIN ) 10 MG tablet Take 10 mg by mouth at bedtime.   Yes [provider]  nitroGLYCERIN  (NITROSTAT ) 0.4 MG SL tablet Place 0.4 mg under the tongue every 5 (five) minutes as needed for chest pain.   Yes [provider]  pantoprazole  (PROTONIX ) 20 MG tablet Take 20 mg by mouth daily. 11/17/20  Yes [provider]  phenazopyridine  (PYRIDIUM ) 100 MG tablet Take 100 mg by mouth every 8 (eight) hours as needed (bladder spasms).   Yes [provider]  polyethylene glycol (MIRALAX  / GLYCOLAX ) packet Take 17 g by mouth daily as needed  (constipation).    Yes [provider]  Prenatal Vit-Fe Fumarate-FA (WESTAB PLUS) 27-1 MG TABS Take 1 tablet by mouth at bedtime.   Yes [provider]  PREVIDENT 5000 BOOSTER PLUS 1.1 % PSTE Take 1 Application by mouth at bedtime. 03/10/24  Yes [provider]  saccharomyces boulardii (FLORASTOR) 250 MG capsule Take 250 mg by mouth every 12 (twelve) hours.   Yes [provider]  sertraline  (ZOLOFT ) 100 MG tablet Take 100 mg by mouth daily. Take with 25mg  Sertraline  tablet for a combined dose 125mg .   Yes [provider]  sertraline  (ZOLOFT ) 25 MG tablet Take 25 mg by mouth daily. Take with 100mg  Sertraline  tablet for a combined dose 125mg .   Yes [provider]  tamsulosin  (FLOMAX ) 0.4 MG CAPS capsule Take 1 capsule (0.4 mg total) by mouth at bedtime. 11/21/20  Yes Golda Lynwood PARAS, MD  trimethoprim  (TRIMPEX ) 100 MG tablet Take 100 mg by mouth at bedtime. **No stop date** per [facility] 02/03/22  Yes [provider]    Physical Exam: Vitals:   05/23/24 0637 05/23/24 0645 05/23/24 0700 05/23/24 0715  BP: 110/61 110/61 125/60 133/64  Pulse: 88 88 88 83  Resp: 18 18 18 18   Temp: 99.2 F (37.3 C) 99.2 F (37.3 C)    TempSrc:      SpO2: 94% 92% 90% 92%  Weight:      Height:        Constitutional: Elderly male appears ill Eyes: PERRL, lids and conjunctivae normal ENMT: Mucous membranes are moist. Posterior pharynx clear of any exudate or lesions.Normal dentition.  Neck: normal, supple, no masses, no thyromegaly Respiratory: clear to auscultation bilaterally, no wheezing, no crackles. Normal respiratory effort. No accessory muscle use.  Cardiovascular: Regular rate and rhythm, no murmurs / rubs / gallops. No extremity edema. 2+ pedal pulses. No carotid bruits.  Abdomen: Foley catheter in place with hematuria present Musculoskeletal: no clubbing / cyanosis.  Contractures present on the left upper and lower extremity Skin: no rashes,  lesions, ulcers. No induration Neurologic: CN 2-12 grossly intact.  Hemiparesis Psychiatric: Normal judgment and insight.  Lethargic unable to assess at this time  Data Reviewed:  EKG reveals sinus tachycardia 122 bpm with nonspecific ST wave changes.  Reviewed labs, imaging, and pertinent records as documented.  Assessment and Plan:  Sepsis secondary to urinary tract infection Urolithiasis Acute.  Patient presented with fever up to 101.8 F with tachycardia, tachypnea, and leukocytosis of 16.2 meeting SIRS criteria.  Urinalysis concerning for infection.  CT noted bilateral renal pelvis calculi with left ureteral calculus without significant hydronephrosis and a large bladder stone.  Blood and urine cultures were obtained.  Initial lactic acid noted to be elevated at 4.5.  Urology evaluated  the patient and he was taken to the operating room for cystoscopy with bilateral ureteral stent placement and left retrograde pyelography. - Admit to a telemetry bed - Follow-up blood and urine cultures - Continue Rocephin  IV.  Adjust antibiotics as deemed medically appropriate based off culture - Trend lactic acid levels  Acute kidney injury Creatinine elevated up to 1.12 with BUN 26 baseline creatinine previously noted to be around 0.7.  Patient had been bolused IV fluids. - Avoid nephrotoxic agents - Continue IV fluids at 75 mL/h - Recheck kidney function in a.m.  Gastric volvulus Hiatal hernia Appears chronic.  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  General surgery had been consulted but felt no acute intervention needed at this time. - Monitor intake and output - Continue Protonix  - Appreciate general surgery consultative services we will follow-up for any further recommendation  History of CVA with residual deficit Patient with residual deficits from prior CVA and is nonambulatory at baseline. - Consider resuming Plavix  when  deemed medically appropriate - Continue baclofen , gabapentin , and Zoloft   BPH With obstruction noted on CT imaging. 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 - Recommend annual screening  Compression fracture Prior to arrival.  Patient was found to have a compression fracture of L3 with 50% height loss.   DVT prophylaxis: Lovenox   Advance Care Planning:   Code Status: Do not attempt resuscitation (DNR) PRE-ARREST INTERVENTIONS DESIRED    Consults: General Surgery and urology  Family Communication: attempted to update sister over the phone, but no answer or call back.  Severity of Illness: The appropriate patient status for this patient is INPATIENT. Inpatient status is judged to be reasonable and necessary in order to provide the required intensity of service to ensure the patient's safety. The patient's presenting symptoms, physical exam findings, and initial radiographic and laboratory data in the context of their chronic comorbidities is felt to place them at high risk for further clinical deterioration. Furthermore, it is not anticipated that the patient will be medically stable for discharge from the hospital within 2 midnights of admission.   * I certify that at the point of admission it is my clinical judgment that the patient will require inpatient hospital care spanning beyond 2 midnights from the point of admission due to high intensity of service, high risk for further deterioration and high frequency of surveillance required.*  Author: Maximino DELENA Sharps, MD 05/23/2024 7:22 AM  For on call review [email or web address].      [1]  Allergies Allergen Reactions   Penicillins Rash    ++++Patient in nursing home. Unable to clarify details/severity of the reaction.++++   "

## 2024-05-23 NOTE — ED Notes (Signed)
 Pt to PACU.

## 2024-05-23 NOTE — Sepsis Progress Note (Signed)
Notified bedside nurse of need to draw blood cultures.  

## 2024-05-23 NOTE — ED Provider Notes (Signed)
 Foley catheter has been placed by nursing staff Vital signs are improving  Discussed with Dr. Dena for admission Pt will go to OR first, then admission  .Critical Care  Performed by: Midge Golas, MD Authorized by: Midge Golas, MD   Critical care provider statement:    Critical care time (minutes):  60   Critical care start time:  05/23/2024 4:15 AM   Critical care end time:  05/23/2024 5:15 AM   Critical care time was exclusive of:  Separately billable procedures and treating other patients   Critical care was necessary to treat or prevent imminent or life-threatening deterioration of the following conditions:  Sepsis and shock   Critical care was time spent personally by me on the following activities:  Examination of patient, evaluation of patient's response to treatment, pulse oximetry, ordering and review of radiographic studies, ordering and review of laboratory studies, re-evaluation of patient's condition and ordering and performing treatments and interventions   I assumed direction of critical care for this patient from another provider in my specialty: yes     Care discussed with: admitting provider       Midge Golas, MD 05/23/24 [phone]

## 2024-05-23 NOTE — Consult Note (Signed)
 "   I have been asked to see the patient by Dr. Nancyann Kin for evaluation and management of ureteral calculus with UTI/sepsis.  History of present illness: 84 yo Kerr with a history of BPH with a history of LUTS and recurrent MDR UTIs. 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 SPT in the past.  He is a patient of Dr. Devere with [facility].  He presents to the ED with left flank pain.  While he is in the ER he has become less responsive and lactate is elevated at 4.5.  CT showed bilateral renal pelvis calculi, left ureteral calculus without significant hydronephrosis and large bladder stone.  He is on Plavix .   Review of systems: Unable to obtain due to patient's clinical status  Patient Active Problem List   Diagnosis Date Noted   SIRS (systemic inflammatory response syndrome) (HCC) 02/11/2022   Bacteremia due to Proteus species    Acute renal failure superimposed on stage 3a chronic kidney disease (HCC) 06/29/2021   Bladder spasm 06/29/2021   Sepsis (HCC) 06/28/2021   Spasticity 06/28/2021   Prolonged QT interval 06/28/2021   Constipation 06/28/2021   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 03/08/2021   Generalized weakness    Acute CVA (cerebrovascular accident) (HCC) 11/19/2020   Allergic rhinitis due to allergen 04/27/2016   Chest pain 02/27/2016   Thoracic ascending aortic aneurysm 02/27/2016   Leukocytosis 02/27/2016   Major depression, chronic 02/16/2016   Neuropathic pain 02/16/2016   Diarrhea 12/29/2015   Coronary artery disease involving native coronary artery of native heart with angina pectoris 11/12/2015   History of CVA with residual deficit 10/14/2015   Hyperlipidemia 07/16/2015   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) 12/10/2014   Angina pectoris 12/10/2014   Chronic obstructive pulmonary disease (HCC) 12/10/2014   Irritable bowel syndrome  with diarrhea 12/10/2014   Senile osteoporosis 12/10/2014   CVA (cerebral vascular accident) (HCC) 12/10/2014   Acute on chronic diastolic CHF (congestive heart failure) (HCC) 09/27/2014   Hypokalemia 09/23/2014   Pancreatitis, gallstone 09/22/2014   Chronic diastolic heart failure (HCC) 09/22/2014   Acute cholangitis (HCC) 09/22/2014   Gallstone pancreatitis 09/22/2014   Hiatal hernia with GERD 08/31/2014   Vitamin D  deficiency 08/03/2014   Hyperlipidemia LDL goal <100 08/03/2014   CVA, old, hemiparesis (HCC) 08/03/2014   Leg edema, left 08/03/2014   Gastroesophageal reflux disease without esophagitis 06/18/2014   Essential hypertension, benign 06/18/2014   Primary generalized hypertrophic osteoarthrosis 06/18/2014   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   UTI (urinary tract infection) 04/04/2014   Essential hypertension 04/03/2014   Other specified chronic obstructive pulmonary disease (HCC) 01/15/2014   Dermatophytosis of nail 12/18/2013   Essential hypertension 12/18/2013   Generalized osteoarthrosis, unspecified site 12/18/2013   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   GERD (gastroesophageal reflux disease) 08/11/2013   Disuse osteoporosis 08/11/2013   Chronic pain syndrome 01/30/2013   IBS (irritable bowel syndrome) 01/30/2013   Cerebral infarct (HCC) 10/24/2012   HTN (hypertension) 10/24/2012   Osteoporosis 10/24/2012   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   Other and unspecified hyperlipidemia 10/24/2012   Hiatal hernia 10/24/2012    Medications Ordered Prior to Encounter[1]  Past Medical History:  Diagnosis Date   GERD (gastroesophageal reflux disease)    Hernia    large one; on my left stomach (09/22/2014)   Hiatal hernia 10/24/2012  HTN (hypertension) 10/24/2012   Hyperlipidemia    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   Osteoporosis, unspecified 10/24/2012   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   Pneumonia    once or twice  (09/22/2014)   Retinal detachment    left eye   Stroke [facility]) 2007   left side doesn't work now (09/22/2014)    Past Surgical History:  Procedure Laterality Date   APPENDECTOMY  05/27/2009   Jeoffrey Dawn, MD   CATARACT EXTRACTION W/ INTRAOCULAR LENS  IMPLANT, BILATERAL Bilateral ?2014   CHOLECYSTECTOMY N/A 09/26/2014   Procedure: LAPAROSCOPIC CHOLECYSTECTOMY WITH INTRAOPERATIVE CHOLANGIOGRAM;  Surgeon: Donnice Lima, MD;  Location: [facility] OR;  Service: General;  Laterality: N/A;   COLONOSCOPY WITH PROPOFOL  N/A 12/29/2015   Procedure: COLONOSCOPY WITH PROPOFOL ;  Surgeon: Jerrell Sol, MD;  Location: [facility] ENDOSCOPY;  Service: Endoscopy;  Laterality: N/A;   ERCP N/A 09/25/2014   Procedure: ENDOSCOPIC RETROGRADE CHOLANGIOPANCREATOGRAPHY (ERCP);  Surgeon: Oliva Boots, MD;  Location: [facility] ENDOSCOPY;  Service: Endoscopy;  Laterality: N/A;   IR CATHETER TUBE CHANGE  05/24/2021   PARS PLANA VITRECTOMY Left 01/30/2019   Procedure: PARS PLANA VITRECTOMY WITH 25 GAUGE;  Surgeon: Jarold Mayo, MD;  Location: [facility] OR;  Service: Ophthalmology;  Laterality: Left;   PARS PLANA VITRECTOMY Right 11/13/2019   Procedure: PARS PLANA VITRECTOMY WITH 25 GAUGE WITH REMOVAL OF POSTERIOR CAPSULAR OPACIFICATION WITH VITRECTOR;  Surgeon: Jarold Mayo, MD;  Location: [facility] OR;  Service: Ophthalmology;  Laterality: Right;   PHOTOCOAGULATION WITH LASER Left 01/30/2019   Procedure: PHOTOCOAGULATION WITH LASER;  Surgeon: Jarold Mayo, MD;  Location: [facility] OR;  Service: Ophthalmology;  Laterality: Left;    Social History[2]  Family History  Problem Relation Age of Onset   Heart attack Mother    Heart attack Father    Cancer Maternal Aunt        Stomach   Cancer Paternal Wylie Kennyth Proud    PE: Vitals:   05/22/24 2028 05/22/24 2240 05/23/24 0111 05/23/24 0231  BP:   106/Gregory   Pulse:   (!) 107   Resp:   20   Temp:   98.5 F (36.9 C) (!) 101.8 F (38.8 C)  TempSrc:   Oral Rectal  SpO2:  96% 95%   Weight: 78.4 kg      Height: 5' 6 (1.676 m)      Pt laying in bed sleeping, not arousable No apparent distress  Recent Labs    05/22/24 2029  WBC 16.2*  HGB 15.8  HCT 48.1   Recent Labs    05/22/24 2029  NA 139  K 4.5  CL 105  CO2 18*  GLUCOSE 150*  BUN 26*  CREATININE 1.12  CALCIUM  9.6   No results for input(s): LABPT, INR in the last 72 hours. No results for input(s): LABURIN in the last 72 hours. Results for orders placed or performed during the hospital encounter of 10/17/22  Blood culture (routine x 2)     Status: None   Collection Time: 10/17/22  2:57 PM   Specimen: BLOOD RIGHT WRIST  Result Value Ref Range Status   Specimen Description BLOOD RIGHT WRIST  Final   Special Requests   Final    BOTTLES DRAWN AEROBIC AND ANAEROBIC Blood Culture adequate volume   Culture   Final    NO GROWTH 5 DAYS Performed at [facility], [address] [place], [place] [address]    Report Status 10/22/2022 FINAL  Final  Blood culture (  routine x 2)     Status: Abnormal   Collection Time: 10/17/22  5:10 PM   Specimen: BLOOD LEFT WRIST  Result Value Ref Range Status   Specimen Description BLOOD LEFT WRIST  Final   Special Requests   Final    BOTTLES DRAWN AEROBIC AND ANAEROBIC Blood Culture results may not be optimal due to an inadequate volume of blood received in culture bottles   Culture  Setup Time   Final    GRAM NEGATIVE RODS AEROBIC BOTTLE ONLY IN BOTH AEROBIC AND ANAEROBIC BOTTLES Organism ID to follow CRITICAL RESULT CALLED TO, READ BACK BY AND VERIFIED WITH: RN BURNARD FEELING [id number] AT [phone] BY EC Performed at [facility], [address] [place], [place] [address]    Culture PROTEUS MIRABILIS (A)  Final   Report Status 10/23/2022 FINAL  Final   Organism ID, Bacteria PROTEUS MIRABILIS  Final      Susceptibility   Proteus mirabilis - MIC*    AMPICILLIN  <=2 SENSITIVE Sensitive     CEFEPIME  <=0.12 SENSITIVE Sensitive     CEFTAZIDIME <=1 SENSITIVE Sensitive      CEFTRIAXONE  <=0.25 SENSITIVE Sensitive     CIPROFLOXACIN  >=4 RESISTANT Resistant     GENTAMICIN <=1 SENSITIVE Sensitive     IMIPENEM 4 SENSITIVE Sensitive     TRIMETH /SULFA  >=320 RESISTANT Resistant     AMPICILLIN /SULBACTAM <=2 SENSITIVE Sensitive     PIP/TAZO <=4 SENSITIVE Sensitive     * PROTEUS MIRABILIS  Blood Culture ID Panel (Reflexed)     Status: Abnormal   Collection Time: 10/17/22  5:10 PM  Result Value Ref Range Status   Enterococcus faecalis NOT DETECTED NOT DETECTED Final   Enterococcus Faecium NOT DETECTED NOT DETECTED Final   Listeria monocytogenes NOT DETECTED NOT DETECTED Final   Staphylococcus species NOT DETECTED NOT DETECTED Final   Staphylococcus aureus (BCID) NOT DETECTED NOT DETECTED Final   Staphylococcus epidermidis NOT DETECTED NOT DETECTED Final   Staphylococcus lugdunensis NOT DETECTED NOT DETECTED Final   Streptococcus species NOT DETECTED NOT DETECTED Final   Streptococcus agalactiae NOT DETECTED NOT DETECTED Final   Streptococcus pneumoniae NOT DETECTED NOT DETECTED Final   Streptococcus pyogenes NOT DETECTED NOT DETECTED Final   A.calcoaceticus-baumannii NOT DETECTED NOT DETECTED Final   Bacteroides fragilis NOT DETECTED NOT DETECTED Final   Enterobacterales DETECTED (A) NOT DETECTED Final    Comment: Enterobacterales represent a large order of gram negative bacteria, not a single organism. CRITICAL RESULT CALLED TO, READ BACK BY AND VERIFIED WITH: RN BURNARD FEELING [id number] AT [phone] BY EC    Enterobacter cloacae complex NOT DETECTED NOT DETECTED Final   Escherichia coli NOT DETECTED NOT DETECTED Final   Klebsiella aerogenes NOT DETECTED NOT DETECTED Final   Klebsiella oxytoca NOT DETECTED NOT DETECTED Final   Klebsiella pneumoniae NOT DETECTED NOT DETECTED Final   Proteus species DETECTED (A) NOT DETECTED Final    Comment: CRITICAL RESULT CALLED TO, READ BACK BY AND VERIFIED WITH: RN BURNARD FEELING [id number] AT [id number] BY EC    Salmonella species NOT DETECTED  NOT DETECTED Final   Serratia marcescens NOT DETECTED NOT DETECTED Final   Haemophilus influenzae NOT DETECTED NOT DETECTED Final   Neisseria meningitidis NOT DETECTED NOT DETECTED Final   Pseudomonas aeruginosa NOT DETECTED NOT DETECTED Final   Stenotrophomonas maltophilia NOT DETECTED NOT DETECTED Final   Candida albicans NOT DETECTED NOT DETECTED Final   Candida auris NOT DETECTED NOT DETECTED Final   Candida glabrata NOT DETECTED NOT  DETECTED Final   Candida krusei NOT DETECTED NOT DETECTED Final   Candida parapsilosis NOT DETECTED NOT DETECTED Final   Candida tropicalis NOT DETECTED NOT DETECTED Final   Cryptococcus neoformans/gattii NOT DETECTED NOT DETECTED Final   CTX-M ESBL NOT DETECTED NOT DETECTED Final   Carbapenem resistance IMP NOT DETECTED NOT DETECTED Final   Carbapenem resistance KPC NOT DETECTED NOT DETECTED Final   Carbapenem resistance NDM NOT DETECTED NOT DETECTED Final   Carbapenem resist OXA 48 LIKE NOT DETECTED NOT DETECTED Final   Carbapenem resistance VIM NOT DETECTED NOT DETECTED Final    Comment: Performed at [facility], [address] [place], [place] [address]    Imaging: none  A/P: Urolithiasis: Bilateral renal pelvis stones, bladder calculus, left ureteral calculus in setting of sepsis from urinary source UTI: history of MDR.  Culture pending.  - Reviewed patient's CT scan and history.  Based on clinical presentation recommend cystoscopy with bilateral ureteral stent placement.  This may be challenging due to large bladder calculus and the fact that he is on Plavix .  However, he has no hydronephrosis and with anticoagulation this would be challenging nephrostomy tubes. - I discussed proceeding with stents with patient's cousin and she agreed   POA Kandra Palin) - informed consent obtained over the phone Tanis Burnley D Boniface Goffe        [1]  No current facility-administered medications on file prior to encounter.   Current Outpatient  Medications on File Prior to Encounter  Medication Sig Dispense Refill   acetaminophen  (TYLENOL ) 650 MG CR tablet Take 650 mg by mouth in the morning and at bedtime.     albuterol  (VENTOLIN  HFA) 108 (90 Base) MCG/ACT inhaler Inhale 1-2 puffs into the lungs every 6 (six) hours as needed for wheezing or shortness of breath. 8 g 0   Amino Acids-Protein Hydrolys (PRO-STAT AWC) LIQD Take 30 mLs by mouth 2 (two) times daily with a meal.     atorvastatin  (LIPITOR) 40 MG tablet Take 1 tablet (40 mg total) by mouth daily. (Patient taking differently: Take 40 mg by mouth every evening.) 30 tablet 0   baclofen  (LIORESAL ) 20 MG tablet Take 20 mg by mouth 3 (three) times daily.     bisacodyl  (DULCOLAX) 10 MG suppository Place 10 mg rectally daily as needed for moderate constipation.     calcium -vitamin D  (OSCAL WITH D) 500-5 MG-MCG tablet Take 1 tablet by mouth 2 (two) times daily.     clopidogrel  (PLAVIX ) Gregory MG tablet Take 1 tablet (Gregory mg total) by mouth daily. 30 tablet 0   clotrimazole-betamethasone (LOTRISONE) cream Apply 1 Application topically daily.     Cranberry 450 MG TABS Take 450 mg by mouth daily.     Dextromethorphan -quiNIDine (NUEDEXTA ) 20-10 MG capsule Take 1 capsule by mouth at bedtime. For Pseudobulbar Affect (PBA)     gabapentin  (NEURONTIN ) 100 MG capsule Take 200 mg by mouth 3 (three) times daily.     gentamicin ointment (GARAMYCIN) 0.1 % Apply 1 Application topically daily.     HYDROcodone -acetaminophen  (NORCO/VICODIN) 5-325 MG tablet Take 1 tablet by mouth in the morning, at noon, and at bedtime.     loperamide (IMODIUM A-D) 2 MG tablet Take 4 mg by mouth 4 (four) times daily as needed for diarrhea or loose stools.     loratadine  (CLARITIN ) 10 MG tablet Take 10 mg by mouth at bedtime.     nitroGLYCERIN  (NITROSTAT ) 0.4 MG SL tablet Place 0.4 mg under the tongue every 5 (five)  minutes as needed for chest pain.     pantoprazole  (PROTONIX ) 20 MG tablet Take 20 mg by mouth daily.      phenazopyridine  (PYRIDIUM ) 100 MG tablet Take 100 mg by mouth every 8 (eight) hours as needed (bladder spasms).     polyethylene glycol (MIRALAX  / GLYCOLAX ) packet Take 17 g by mouth daily as needed (constipation).      Prenatal Vit-Fe Fumarate-FA (WESTAB PLUS) 27-1 MG TABS Take 1 tablet by mouth at bedtime.     PREVIDENT 5000 BOOSTER PLUS 1.1 % PSTE Take 1 Application by mouth at bedtime.     saccharomyces boulardii (FLORASTOR) 250 MG capsule Take 250 mg by mouth every 12 (twelve) hours.     sertraline  (ZOLOFT ) 100 MG tablet Take 100 mg by mouth daily. Take with 25mg  Sertraline  tablet for a combined dose 125mg .     sertraline  (ZOLOFT ) 25 MG tablet Take 25 mg by mouth daily. Take with 100mg  Sertraline  tablet for a combined dose 125mg .     tamsulosin  (FLOMAX ) 0.4 MG CAPS capsule Take 1 capsule (0.4 mg total) by mouth at bedtime. 30 capsule 0   trimethoprim  (TRIMPEX ) 100 MG tablet Take 100 mg by mouth at bedtime. **No stop date** per [facility]    [2]  Social History Tobacco Use   Smoking status: Former    Current packs/day: 0.00    Average packs/day: 1.0 packs/day    Types: Cigarettes    Quit date: 12/20/2005    Years since quitting: 18.4   Smokeless tobacco: Never  Vaping Use   Vaping status: Never Used  Substance Use Topics   Alcohol  use: Not Currently    Comment: 09/22/2014 stopped in 2007; drank occasionally when I did drink   Drug use: No   "

## 2024-05-23 NOTE — Op Note (Signed)
 Preoperative diagnosis:  Bilateral renal pelvis calculi Left ureteral calculus Bladder calculi   Postoperative diagnosis:  Same  Procedure:  Cystoscopy bilateral ureteral stent placement left retrograde pyelography with interpretation   Surgeon: Valli Shank, MD  Anesthesia: General  Complications: None  Intraoperative findings:   Normal anterior urethra Fixed prostatic urethra/high riding bladder neck Bilateral orthotopic Uos Large bladder calculus 3 cm Left retrograde pyelogram with tortuous ureter and moderate hydronephrosis  EBL: Minimal  Specimens: None  Indication: Gregory Kerr is a 84 y.o. patient with multiple upper tract calculi including bilateral renal pelvis, left ureteral calculus and bladder calculi.  Patient is minimally responsive and I have discussed the procedure with his POA, cousin [name].  We have discussed the potential benefits and risks of the procedure, side effects of the proposed treatment, the likelihood of the patient achieving the goals of the procedure, and any potential problems that might occur during the procedure or recuperation. Informed consent has been obtained.  Description of procedure:  The patient was taken to the operating room and general anesthesia was induced.  The patient was placed in the dorsal lithotomy position, prepped and draped in the usual sterile fashion, and preoperative antibiotics were administered. A preoperative time-out was performed.   Cystourethroscopy was performed.  The patients urethra was examined and demonstrated fixed prostatic urethral.  There was large bladder calculus 3cm and several small calculi.   Attention then turned to the leftureteral orifice and a ureteral catheter was used to intubate the ureteral orifice.  Omnipaque  contrast was injected through the ureteral catheter and a retrograde pyelogram was performed with findings as dictated above.  A 0.38 sensor guidewire was then advanced up  the left ureter into the renal pelvis under fluoroscopic guidance.  The wire was then backloaded through the cystoscope and a ureteral stent was advance over the wire using Seldinger technique.  The stent was positioned appropriately under fluoroscopic and cystoscopic guidance.  The wire was then removed with an adequate stent curl noted in the renal pelvis as well as in the bladder.  Attention then turned to the right ureteral orifice.  An open-ended ureteral catheter was used to guide the 0.38 sensor wire into the right ureter.  It was advanced with fluoroscopic guidance up to the kidney.  The open-ended ureteral catheter was removed and a 6 French by 26 cm ureteral stent was placed in standard fashion.  Proximal curl was seen by fluoroscopy and distal curl seen under direct visualization.  The cystoscope was removed.  A 20 French coud Foley catheter was then placed without difficulty and inflated with 10 cc sterile water .  The patient appeared to tolerate the procedure well and without complications.  The patient was able to be awakened and transferred to the recovery unit in satisfactory condition.    Valli Shank, M.D.

## 2024-05-23 NOTE — Plan of Care (Signed)

## 2024-05-23 NOTE — Transfer of Care (Signed)
 Immediate Anesthesia Transfer of Care Note  Patient: Gregory Kerr  Procedure(s) Performed: CYSTOSCOPY, WITH RETROGRADE PYELOGRAM AND URETERAL STENT INSERTION (Bilateral)  Patient Location: PACU  Anesthesia Type:General  Level of Consciousness: awake  Airway & Oxygen Therapy: Patient Spontanous Breathing and Patient connected to nasal cannula oxygen  Post-op Assessment: Report given to RN and Post -op Vital signs reviewed and stable  Post vital signs: Reviewed and stable  Last Vitals:  Vitals Value Taken Time  BP 110/61 05/23/24 06:37  Temp    Pulse 88 05/23/24 06:38  Resp 18 05/23/24 06:38  SpO2 95 % 05/23/24 06:38  Vitals shown include unfiled device data.  Last Pain:  Vitals:   05/23/24 0530  TempSrc:   PainSc: Asleep         Complications: No notable events documented.

## 2024-05-24 DIAGNOSIS — N390 Urinary tract infection, site not specified: Secondary | ICD-10-CM | POA: Diagnosis not present

## 2024-05-24 DIAGNOSIS — A419 Sepsis, unspecified organism: Secondary | ICD-10-CM | POA: Diagnosis not present

## 2024-05-24 LAB — CBC
HCT: 36 % — ABNORMAL LOW (ref 39.0–52.0)
Hemoglobin: 11.8 g/dL — ABNORMAL LOW (ref 13.0–17.0)
MCH: 30.1 pg (ref 26.0–34.0)
MCHC: 32.8 g/dL (ref 30.0–36.0)
MCV: 91.8 fL (ref 80.0–100.0)
Platelets: 102 K/uL — ABNORMAL LOW (ref 150–400)
RBC: 3.92 MIL/uL — ABNORMAL LOW (ref 4.22–5.81)
RDW: 14.6 % (ref 11.5–15.5)
WBC: 23.4 K/uL — ABNORMAL HIGH (ref 4.0–10.5)
nRBC: 0 % (ref 0.0–0.2)

## 2024-05-24 LAB — BASIC METABOLIC PANEL WITH GFR
Anion gap: 10 (ref 5–15)
BUN: 40 mg/dL — ABNORMAL HIGH (ref 8–23)
CO2: 22 mmol/L (ref 22–32)
Calcium: 8.5 mg/dL — ABNORMAL LOW (ref 8.9–10.3)
Chloride: 107 mmol/L (ref 98–111)
Creatinine, Ser: 0.94 mg/dL (ref 0.61–1.24)
GFR, Estimated: >60 mL/min
Glucose, Bld: 119 mg/dL — ABNORMAL HIGH (ref 70–99)
Potassium: 4.1 mmol/L (ref 3.5–5.1)
Sodium: 140 mmol/L (ref 135–145)

## 2024-05-24 MED ORDER — TRAZODONE HCL 50 MG PO TABS
50.0000 mg | ORAL_TABLET | Freq: Every evening | ORAL | Status: DC | PRN
  Administered 2024-05-24 – 2024-05-27 (×4): 50 mg via ORAL
  Filled 2024-05-24 (×4): qty 1

## 2024-05-24 MED ORDER — LORAZEPAM 2 MG/ML IJ SOLN
0.5000 mg | Freq: Four times a day (QID) | INTRAMUSCULAR | Status: DC | PRN
  Administered 2024-05-27: 0.5 mg via INTRAVENOUS
  Filled 2024-05-24: qty 1

## 2024-05-24 NOTE — Anesthesia Postprocedure Evaluation (Signed)
"   Anesthesia Post Note  Patient: Gregory Kerr  Procedure(s) Performed: CYSTOSCOPY WITH LEFT RETROGRADE PYELOGRAM AND BILATARAL URETERAL STENT INSERTION (Bilateral)     Patient location during evaluation: PACU Anesthesia Type: General Level of consciousness: sedated Pain management: pain level controlled Vital Signs Assessment: post-procedure vital signs reviewed and stable Respiratory status: spontaneous breathing, nonlabored ventilation, respiratory function stable and patient connected to nasal cannula oxygen Cardiovascular status: blood pressure returned to baseline and stable Postop Assessment: no apparent nausea or vomiting Anesthetic complications: no   No notable events documented.  Last Vitals:  Vitals:   05/24/24 0531 05/24/24 0832  BP: 120/63 120/61  Pulse: 87 75  Resp:    Temp: 36.6 C 36.6 C  SpO2: 93% 92%    Last Pain:  Vitals:   05/24/24 0348  TempSrc:   PainSc: 5                  [name]      "

## 2024-05-24 NOTE — Progress Notes (Signed)
 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 Minimal hydro on ER CT prior.  2 - Possible Urosepsis -sepsis picture from suspect GU source. UCX/BCX 1/2 pending. Placed on Rocephin .  Today Ron is mixed. No high grade fevers, less malaise but WBC up to 23k. CX's pending.   Objective: Vital signs in last 24 hours: Temp:  [97.9 F (36.6 C)-99.2 F (37.3 C)] 97.9 F (36.6 C) (01/03 0531) Pulse Rate:  [83-96] 87 (01/03 0531) Resp:  [14-26] 19 (01/02 2329) BP: (110-160)/(60-81) 120/63 (01/03 0531) SpO2:  [90 %-98 %] 93 % (01/03 0531) Last BM Date : 05/23/24  Intake/Output from previous day: 01/02 0701 - 01/03 0700 In: -  Out: 400 [Urine:400] Intake/Output this shift: No intake/output data recorded.  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 ABG No results for input(s): PHART, HCO3 in the last 72 hours.  Invalid input(s): PCO2, PO2  Studies/Results: DG C-Arm 1-60 Min-No Report Result Date: 05/23/2024 Fluoroscopy was utilized by the requesting physician.  No radiographic interpretation.   CT Angio Chest PE W and/or Wo Contrast Result Date: 05/22/2024 EXAM: CTA CHEST PE WITHOUT AND WITH CONTRAST CT ABDOMEN AND PELVIS WITHOUT AND WITH CONTRAST 05/22/2024 10:47:02 PM TECHNIQUE: CTA of the chest was performed after the administration of 75 mL of intravenous contrast (iohexol  (OMNIPAQUE ) 350 MG/ML injection). Multiplanar  reformatted images are provided for review. MIP images are provided for review. CT of the abdomen and pelvis was performed with and without the administration of intravenous contrast. Automated exposure control, iterative reconstruction, and/or weight based adjustment of the mA/kV was utilized to reduce the radiation dose to as low as reasonably achievable. COMPARISON: Findings were compared to prior studies from 2022 and 02/11/2022. CLINICAL HISTORY: Pulmonary embolism (PE) high prob. Nephrolithiasis, abdominal and flank pain, hyperbilirubinemia, pulmonary embolism. FINDINGS: CHEST: PULMONARY ARTERIES: Pulmonary arteries are adequately opacified for evaluation. No intraluminal filling defect to suggest pulmonary embolism. Main pulmonary artery is normal in caliber. MEDIASTINUM: Extensive multivessel coronary artery calcification. The heart and pericardium demonstrate no acute abnormality. 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 The descending thoracic aorta is of normal caliber. No mediastinal lymphadenopathy. LUNGS AND PLEURA: Large hiatal hernia containing the entire stomach which demonstrates organoaxial gastric volvulus, similar to prior examination, without obstruction. Resultant marked elevation of the left hemidiaphragm, left basilar atelectasis and left-sided volume loss. Mild right basilar atelectasis. No pleural effusion or pneumothorax. SOFT TISSUES AND BONES: Osseous structures are age appropriate. No acute soft tissue abnormality. ABDOMEN AND PELVIS: LIVER: The liver is unremarkable. GALLBLADDER AND BILE DUCTS: Status post cholecystectomy. No biliary ductal dilatation. SPLEEN: Spleen demonstrates no acute abnormality. PANCREAS: Moderate fatty atrophy of the pancreas. There is increasing herniation of the proximal tail  of the pancreas into the thoracic compartment. No evidence of  superimposed inflammatory change, however. ADRENAL GLANDS: Adrenal glands demonstrate no acute abnormality. KIDNEYS, URETERS AND BLADDER: Mild asymmetric right renal cortical atrophy. Simple cortical cyst arises from the lower pole of the right kidney. No follow up imaging is recommended. Bilateral nonobstructing renal calculi are noted within the lower pole of the right kidney and within the renal pelvis bilaterally measuring up to 14 mm in the left renal pelvis and 16 mm within the right renal pelvis. No hydronephrosis. No ureteral calculi on the right. Nonobstructing 4 mm calculus within the mid left ureter at the level of the crossing iliac vasculature. 27 mm calculus noted dependently within the bladder lumen. 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 The bladder is not distended. 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 No perinephric or periureteral stranding. GI AND BOWEL: Large right inguinal hernia contains the terminal ileum which appears unremarkable. Moderate fat-containing left inguinal hernia. No evidence of obstruction or focal inflammation. Status post appendectomy. The duodenal sweep demonstrates no acute abnormality. There is no bowel obstruction. No abnormal bowel wall thickening or distension. 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 PERITONEUM AND RETROPERITONEUM: No ascites or free air. LYMPH NODES: No lymphadenopathy. BONES AND SOFT TISSUES: Subacute to remote-appearing anterior wedge compression deformity of L3 with 50% loss of height, new from prior examination. Osseous structures are otherwise age appropriate. Moderate aortoiliac  atherosclerotic calcification. No focal soft tissue abnormality. LIMITATIONS/ARTIFACTS: Imaging is limited by respiratory motion artifact. IMPRESSION: 1. No pulmonary embolism. 2. 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 3. 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 Recommend annual imaging follow-up by CTA or mra. This recommendation follows 2010 accf/aha/aats/ACR/asa/sca/scai/sir/sts/svm guidelines for the diagnosis and management of patients with thoracic aortic disease. Circulation. 2010; 121: Z733-z630. Aortic aneurysm nos (icd10-i71.9) 4. Bilateral nonobstructing renal calculi, with a 4 mm nonobstructing calculus in the mid left ureter and a 27 mm calculus in the bladder lumen. 5. 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 6. Large right inguinal hernia containing the terminal ileum and moderate fat-containing left inguinal hernia, without obstruction or focal inflammation. 7. Subacute to remote-appearing anterior wedge compression deformity of L3 with 50% loss of height, new from prior examination. Correlation with clinical exam is recommended. If indicated, MRI examination will be more helpful to better age the fracture. 8. Extensive multivessel coronary artery calcification. 9. Status post cholecystectomy and appendectomy. Electronically signed by: Dorethia Molt MD 05/22/2024 11:39 PM EST RP Workstation: [id number]   CT ABDOMEN PELVIS W CONTRAST Result Date: 05/22/2024 EXAM: CTA CHEST PE WITHOUT AND WITH CONTRAST CT ABDOMEN AND PELVIS WITHOUT AND WITH CONTRAST 05/22/2024 10:47:02 PM TECHNIQUE: CTA of the chest was performed after the administration of 75 mL of intravenous contrast (iohexol  (OMNIPAQUE ) 350 MG/ML injection). Multiplanar reformatted images are provided for review. MIP  images are provided for review. CT of the abdomen and pelvis was performed with and without the administration of intravenous contrast. Automated exposure control, iterative reconstruction, and/or weight based adjustment of the mA/kV was utilized to reduce the radiation dose to as low as reasonably achievable.  COMPARISON: Findings were compared to prior studies from 2022 and 02/11/2022. CLINICAL HISTORY: Pulmonary embolism (PE) high prob. Nephrolithiasis, abdominal and flank pain, hyperbilirubinemia, pulmonary embolism. FINDINGS: CHEST: PULMONARY ARTERIES: Pulmonary arteries are adequately opacified for evaluation. No intraluminal filling defect to suggest pulmonary embolism. Main pulmonary artery is normal in caliber. MEDIASTINUM: Extensive multivessel coronary artery calcification. The heart and pericardium demonstrate no acute abnormality. 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 The descending thoracic aorta is of normal caliber. No mediastinal lymphadenopathy. LUNGS AND PLEURA: Large hiatal hernia containing the entire stomach which demonstrates organoaxial gastric volvulus, similar to prior examination, without obstruction. Resultant marked elevation of the left hemidiaphragm, left basilar atelectasis and left-sided volume loss. Mild right basilar atelectasis. No pleural effusion or pneumothorax. SOFT TISSUES AND BONES: Osseous structures are age appropriate. No acute soft tissue abnormality. ABDOMEN AND PELVIS: LIVER: The liver is unremarkable. GALLBLADDER AND BILE DUCTS: Status post cholecystectomy. No biliary ductal dilatation. SPLEEN: Spleen demonstrates no acute abnormality. PANCREAS: Moderate fatty atrophy of the pancreas. There is increasing herniation of the proximal tail of the pancreas into the thoracic compartment. No evidence of superimposed inflammatory change, however. ADRENAL GLANDS:  Adrenal glands demonstrate no acute abnormality. KIDNEYS, URETERS AND BLADDER: Mild asymmetric right renal cortical atrophy. Simple cortical cyst arises from the lower pole of the right kidney. No follow up imaging is recommended. Bilateral nonobstructing renal calculi are noted within the lower pole of the right kidney and within the renal pelvis bilaterally measuring up to 14 mm in the left renal pelvis and 16 mm within the right renal pelvis. No hydronephrosis. No ureteral calculi on the right. Nonobstructing 4 mm calculus within the mid left ureter at the level of the crossing iliac vasculature. 27 mm calculus noted dependently within the bladder lumen. 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 The bladder is not distended. 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 No perinephric or periureteral stranding. GI AND BOWEL: Large right inguinal hernia contains the terminal ileum which appears unremarkable. Moderate fat-containing left inguinal hernia. No evidence of obstruction or focal inflammation. Status post appendectomy. The duodenal sweep demonstrates no acute abnormality. There is no bowel obstruction. No abnormal bowel wall thickening or distension. 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 PERITONEUM AND RETROPERITONEUM: No ascites or free air. LYMPH NODES: No lymphadenopathy. BONES AND SOFT TISSUES: Subacute to remote-appearing anterior wedge compression deformity of L3 with 50% loss of height, new from prior examination. Osseous structures are otherwise age appropriate. Moderate aortoiliac atherosclerotic calcification. No focal soft tissue  abnormality. LIMITATIONS/ARTIFACTS: Imaging is limited by respiratory motion artifact. IMPRESSION: 1. No pulmonary embolism. 2. 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 3. 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 Recommend annual imaging follow-up by CTA or mra. This recommendation follows 2010 accf/aha/aats/ACR/asa/sca/scai/sir/sts/svm guidelines for the diagnosis and management of patients with thoracic aortic disease. Circulation. 2010; 121: [id number]. Aortic aneurysm nos (icd10-i71.9) 4. Bilateral nonobstructing renal calculi, with a 4 mm nonobstructing calculus in the mid left ureter and a 27 mm calculus in the bladder lumen. 5. Changes of chronic bladder outlet obstruction  with trabeculated bladder and numerous diverticula, likely secondary to prostatic hypertrophy. 6. Large right inguinal hernia containing the terminal ileum and moderate fat-containing left inguinal hernia, without obstruction or focal inflammation. 7. Subacute to remote-appearing anterior wedge compression deformity of L3 with 50% loss of height, new from prior examination. Correlation with clinical exam is recommended. If indicated, MRI examination will be more helpful to better age the fracture. 8. Extensive multivessel coronary artery calcification. 9. Status post cholecystectomy and appendectomy. Electronically signed by: Dorethia Molt MD 05/22/2024 11:39 PM EST RP Workstation: [id number]   DG Chest Port 1 View Result Date: 05/22/2024 EXAM: 1 VIEW(S) XRAY OF THE CHEST 05/22/2024 08:51:07 PM COMPARISON: None available. CLINICAL HISTORY: Back pain. SOB (shortness of breath). FINDINGS: LUNGS AND PLEURA: Low lung volumes accentuate pulmonary vascularity. Elevated left hemidiaphragm. Left basilar atelectasis. No pleural effusion. No pneumothorax. HEART AND MEDIASTINUM: Low lung volumes accentuate the  cardiomediastinal silhouette. Aortic atherosclerotic calcification is present. BONES AND SOFT TISSUES: Gaseous gastric distension. No acute osseous abnormality. IMPRESSION: 1. No acute cardiopulmonary abnormality. 2. Low lung volumes with left basilar atelectasis. 3. Large hiatal hernia with gaseous distention of the intrathoric stomach. Please correlate clinically. Electronically signed by: Greig Pique MD 05/22/2024 08:56 PM EST RP Workstation: [id number]    Anti-infectives: Anti-infectives (From admission, onward)    Start     Dose/Rate Route Frequency Ordered Stop   05/24/24 0400  cefTRIAXone  (ROCEPHIN ) 2 g in sodium chloride  0.9 % 100 mL IVPB        2 g 200 mL/hr over 30 Minutes Intravenous Every 24 hours 05/23/24 0828     05/23/24 0315  cefTRIAXone  (ROCEPHIN ) 1 g in sodium chloride  0.9 % 100 mL IVPB        1 g 200 mL/hr over 30 Minutes Intravenous  Once 05/23/24 0301 05/23/24 0447       Assessment/Plan:  Now s/p bilateral stentign for decompression, no more procedural intervention from GU perspective this hospitalization. Agree with current ABX pending additional CX data.He appears to be improving, suspect WBC lagging indicator. OK for DC foley when not needed for UOP monitoring. Will likely round again Monday.   Call with questions.    Ricardo KATHEE Alvaro Mickey. 05/24/2024

## 2024-05-24 NOTE — Plan of Care (Signed)

## 2024-05-24 NOTE — Progress Notes (Signed)
 " PROGRESS NOTE    Gregory Kerr  FMW:[id number] DOB: Jun 08, 1940 DOA: 05/22/2024 PCP: Donnella Agreste   Brief Narrative:   84 y.o. male with medical history significant of hypertension, hyperlipidemia, diastolic congestive heart failure, CVA with residual deficits, MDR UTIs, BPH presented with left sided flank pain.   Urology consulted. s/p bilateral JJ stents 05/23/24 by Dr Elisabeth.   Being treated for sepsis, likely UTI, on IV antibiotics F/u urine and blood cultures Dispo is back to SNF once medically ready.  Assessment & Plan:  Principal Problem:   Sepsis secondary to UTI [facility]) Active Problems:   Urolithiasis   AKI (acute kidney injury)   Hiatal hernia   Gastric volvulus   History of CVA with residual deficit   BPH (benign prostatic hyperplasia)   AAA (abdominal aortic aneurysm)   Closed compression fracture of L3 vertebra (HCC)   Sepsis secondary to urinary tract infection Urolithiasis CT noted bilateral renal pelvis calculi with left ureteral calculus without significant hydronephrosis and a large bladder stone.  Blood and urine cultures were obtained.  Initial lactic acid noted to be elevated at 4.5.  Urology evaluated the patient and he was taken to the operating room for cystoscopy with bilateral ureteral stent placement and left retrograde pyelography on 05/23/24. - Follow-up blood and urine cultures - Continue Rocephin  IV.  Adjust antibiotics as deemed medically appropriate based off culture - Trend lactic acid levels -WBC count today is 23 up from 16. -Ordered CBC and BMP to be checked in am.   Acute kidney injury Creatinine elevated up to 1.12 with BUN 26 baseline creatinine previously noted to be around 0.7.  Patient had been bolused IV fluids. - Avoid nephrotoxic agents - Continue IV fluids at 75 mL/h - Recheck kidney function in a.m.   Gastric volvulus Hiatal hernia Appears chronic.  CT revealed large hiatal hernia containing entire stomach with gastric volvulus  without obstruction.  General surgery had been consulted but felt no acute intervention needed at this time. - Monitor intake and output - Continue Protonix  - Appreciate general surgery's input.   History of CVA with residual deficit/left sided hemiparesis Patient with residual deficits from prior CVA and is nonambulatory at baseline. - Consider resuming Plavix  when deemed medically appropriate - Continue baclofen , gabapentin , and Zoloft    BPH With obstruction noted on CT imaging. 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 - Recommend annual screening   Compression fracture Prior to arrival.  Patient was found to have a compression fracture of L3 with 50% height loss.  Disposition: SNF ([facility])   DVT prophylaxis: enoxaparin  (LOVENOX ) injection 40 mg Start: 05/23/24 1536     Code Status: Do not attempt resuscitation (DNR) PRE-ARREST INTERVENTIONS DESIRED Family Communication:  None at the bedside Status is: Inpatient Remains inpatient appropriate because: sepsis, UTI    Subjective:  No acute events overnight. He tole me that he came from a SNF but doesn't remember the name of the SNF.We spoke about his sepsis and further treatment plan. He denies any pain.  Examination:  General exam: Appears calm and comfortable  Respiratory system: Clear to auscultation. Respiratory effort normal. Cardiovascular system: S1 & S2 heard, RRR. No JVD, murmurs, rubs, gallops or clicks. No pedal edema. Gastrointestinal system: Abdomen is nondistended, soft and nontender. No organomegaly or masses felt. Normal bowel sounds heard. Central nervous system: Alert and oriented.  No focal neurological deficits. Extremities: left sided hemiparesis     Diet Orders (From admission, onward)     Start     Ordered   05/23/24 0959  Diet Heart Room service appropriate? Yes;  Fluid consistency: Thin  Diet effective now       Question Answer Comment  Room service appropriate? Yes   Fluid consistency: Thin      05/23/24 0958            Objective: Vitals:   05/23/24 1605 05/23/24 2329 05/24/24 0531 05/24/24 0832  BP: 126/60 127/64 120/63 120/61  Pulse: 87 85 87 75  Resp: (!) 21 19    Temp: 98.7 F (37.1 C) 98.4 F (36.9 C) 97.9 F (36.6 C) 97.9 F (36.6 C)  TempSrc: Oral Oral    SpO2: 90% 93% 93% 92%  Weight:      Height:        Intake/Output Summary (Last 24 hours) at 05/24/2024 1016 Last data filed at 05/24/2024 0600 Gross per 24 hour  Intake --  Output 1050 ml  Net -1050 ml   Filed Weights   05/22/24 2028  Weight: 78.4 kg    Scheduled Meds:  atorvastatin   40 mg Oral QPM   baclofen   20 mg Oral TID   Chlorhexidine  Gluconate Cloth  6 each Topical Daily   Dextromethorphan -quiNIDine  1 capsule Oral QHS   enoxaparin  (LOVENOX ) injection  40 mg Subcutaneous Q24H   gabapentin   200 mg Oral TID   pantoprazole   20 mg Oral Daily   saccharomyces boulardii  250 mg Oral Q12H   sertraline   100 mg Oral Daily   sertraline   25 mg Oral Daily   tamsulosin   0.4 mg Oral QHS   Continuous Infusions:  sodium chloride      cefTRIAXone  (ROCEPHIN )  IV 2 g (05/24/24 0312)   lactated ringers      And   lactated ringers       Nutritional status     Body mass index is 27.9 kg/m.  Data Reviewed:   CBC: Recent Labs  Lab 05/22/24 2029 05/24/24 0526  WBC 16.2* 23.4*  NEUTROABS 15.6*  --   HGB 15.8 11.8*  HCT 48.1 36.0*  MCV 90.9 91.8  PLT 195 102*   Basic Metabolic Panel: Recent Labs  Lab 05/22/24 2029 05/24/24 0526  NA 139 140  K 4.5 4.1  CL 105 107  CO2 18* 22  GLUCOSE 150* 119*  BUN 26* 40*  CREATININE 1.12 0.94  CALCIUM  9.6 8.5*   GFR: Estimated Creatinine Clearance: 60.7 mL/min (by C-G formula based on SCr of 0.94 mg/dL). Liver Function Tests: Recent Labs  Lab 05/22/24 2029  AST 29  ALT 28  ALKPHOS 118  BILITOT 0.6  PROT  8.0  ALBUMIN 4.0   Recent Labs  Lab 05/22/24 2029  LIPASE <10*   No results for input(s): AMMONIA in the last 168 hours. Coagulation Profile: No results for input(s): INR, PROTIME in the last 168 hours. Cardiac Enzymes: No results for input(s): CKTOTAL, CKMB, CKMBINDEX, TROPONINI in the last 168 hours. BNP (last 3 results) No results for input(s): PROBNP in the last 8760 hours. HbA1C: No results for input(s): HGBA1C in the last 72 hours. CBG: Recent Labs  Lab 05/23/24 0207  GLUCAP 140*   Lipid Profile: No results for input(s): CHOL, HDL, LDLCALC, TRIG, CHOLHDL, LDLDIRECT in the last 72 hours. Thyroid  Function Tests: No results for input(s): TSH, T4TOTAL, FREET4, T3FREE, THYROIDAB in the last 72 hours. Anemia Panel:  No results for input(s): VITAMINB12, FOLATE, FERRITIN, TIBC, IRON, RETICCTPCT in the last 72 hours. Sepsis Labs: Recent Labs  Lab 05/23/24 0405  LATICACIDVEN 4.5*    Recent Results (from the past 240 hours)  Blood culture (routine x 2)     Status: None (Preliminary result)   Collection Time: 05/23/24  3:02 AM   Specimen: BLOOD  Result Value Ref Range Status   Specimen Description BLOOD SITE NOT SPECIFIED  Final   Special Requests   Final    BOTTLES DRAWN AEROBIC AND ANAEROBIC Blood Culture results may not be optimal due to an inadequate volume of blood received in culture bottles   Culture   Final    NO GROWTH 1 DAY Performed at [facility], [address] [place], [place] [address]    Report Status PENDING  Incomplete  Blood culture (routine x 2)     Status: None (Preliminary result)   Collection Time: 05/23/24  4:19 PM   Specimen: BLOOD  Result Value Ref Range Status   Specimen Description BLOOD SITE NOT SPECIFIED  Final   Special Requests   Final    BOTTLES DRAWN AEROBIC AND ANAEROBIC Blood Culture results may not be optimal due to an inadequate volume of blood received in culture bottles    Culture   Final    NO GROWTH < 24 HOURS Performed at [facility], [address] [place], [place] [address]    Report Status PENDING  Incomplete         Radiology Studies: DG C-Arm 1-60 Min-No Report Result Date: 05/23/2024 Fluoroscopy was utilized by the requesting physician.  No radiographic interpretation.   CT Angio Chest PE W and/or Wo Contrast Result Date: 05/22/2024 EXAM: CTA CHEST PE WITHOUT AND WITH CONTRAST CT ABDOMEN AND PELVIS WITHOUT AND WITH CONTRAST 05/22/2024 10:47:02 PM TECHNIQUE: CTA of the chest was performed after the administration of 75 mL of intravenous contrast (iohexol  (OMNIPAQUE ) 350 MG/ML injection). Multiplanar reformatted images are provided for review. MIP images are provided for review. CT of the abdomen and pelvis was performed with and without the administration of intravenous contrast. Automated exposure control, iterative reconstruction, and/or weight based adjustment of the mA/kV was utilized to reduce the radiation dose to as low as reasonably achievable. COMPARISON: Findings were compared to prior studies from 2022 and 02/11/2022. CLINICAL HISTORY: Pulmonary embolism (PE) high prob. Nephrolithiasis, abdominal and flank pain, hyperbilirubinemia, pulmonary embolism. FINDINGS: CHEST: PULMONARY ARTERIES: Pulmonary arteries are adequately opacified for evaluation. No intraluminal filling defect to suggest pulmonary embolism. Main pulmonary artery is normal in caliber. MEDIASTINUM: Extensive multivessel coronary artery calcification. The heart and pericardium demonstrate no acute abnormality. 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 The descending thoracic aorta is of normal caliber. No mediastinal lymphadenopathy. LUNGS AND PLEURA: Large hiatal hernia containing the entire stomach which demonstrates organoaxial gastric volvulus, similar to prior  examination, without obstruction. Resultant marked elevation of the left hemidiaphragm, left basilar atelectasis and left-sided volume loss. Mild right basilar atelectasis. No pleural effusion or pneumothorax. SOFT TISSUES AND BONES: Osseous structures are age appropriate. No acute soft tissue abnormality. ABDOMEN AND PELVIS: LIVER: The liver is unremarkable. GALLBLADDER AND BILE DUCTS: Status post cholecystectomy. No biliary ductal dilatation. SPLEEN: Spleen demonstrates no acute abnormality. PANCREAS: Moderate fatty atrophy of the pancreas. There is increasing herniation of the proximal tail of the pancreas into the thoracic compartment. No  evidence of superimposed inflammatory change, however. ADRENAL GLANDS: Adrenal glands demonstrate no acute abnormality. KIDNEYS, URETERS AND BLADDER: Mild asymmetric right renal cortical atrophy. Simple cortical cyst arises from the lower pole of the right kidney. No follow up imaging is recommended. Bilateral nonobstructing renal calculi are noted within the lower pole of the right kidney and within the renal pelvis bilaterally measuring up to 14 mm in the left renal pelvis and 16 mm within the right renal pelvis. No hydronephrosis. No ureteral calculi on the right. Nonobstructing 4 mm calculus within the mid left ureter at the level of the crossing iliac vasculature. 27 mm calculus noted dependently within the bladder lumen. 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 The bladder is not distended. 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 No perinephric or periureteral stranding. GI AND BOWEL: Large right inguinal hernia contains the terminal ileum which appears unremarkable. Moderate fat-containing left inguinal hernia. No evidence of obstruction or focal  inflammation. Status post appendectomy. The duodenal sweep demonstrates no acute abnormality. There is no bowel obstruction. No abnormal bowel wall thickening or distension. 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 PERITONEUM AND RETROPERITONEUM: No ascites or free air. LYMPH NODES: No lymphadenopathy. BONES AND SOFT TISSUES: Subacute to remote-appearing anterior wedge compression deformity of L3 with 50% loss of height, new from prior examination. Osseous structures are otherwise age appropriate. Moderate aortoiliac atherosclerotic calcification. No focal soft tissue abnormality. LIMITATIONS/ARTIFACTS: Imaging is limited by respiratory motion artifact. IMPRESSION: 1. No pulmonary embolism. 2. 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 3. 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 Recommend annual imaging follow-up by CTA or mra. This recommendation follows 2010 accf/aha/aats/ACR/asa/sca/scai/sir/sts/svm guidelines for the diagnosis and management of patients with thoracic aortic disease. Circulation. 2010; 121: Z733-z630. Aortic aneurysm nos (icd10-i71.9) 4. Bilateral nonobstructing renal calculi, with a 4 mm nonobstructing calculus in the mid left ureter and a 27 mm calculus in the bladder lumen. 5. 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 6. Large right inguinal hernia containing the terminal ileum and moderate fat-containing left inguinal hernia, without obstruction or focal inflammation. 7. Subacute to remote-appearing anterior wedge compression deformity of L3 with 50% loss of height, new from prior examination. Correlation with clinical exam is  recommended. If indicated, MRI examination will be more helpful to better age the fracture. 8. Extensive multivessel coronary artery calcification. 9. Status post cholecystectomy and appendectomy. Electronically signed by: Dorethia Molt MD 05/22/2024 11:39 PM EST RP Workstation: [id number]   CT ABDOMEN PELVIS W CONTRAST Result Date: 05/22/2024 EXAM: CTA CHEST PE WITHOUT AND WITH CONTRAST CT ABDOMEN AND PELVIS WITHOUT AND WITH CONTRAST 05/22/2024 10:47:02 PM TECHNIQUE: CTA of the chest was performed after the administration of 75 mL of intravenous contrast (iohexol  (OMNIPAQUE ) 350 MG/ML injection). Multiplanar reformatted images are provided for review. MIP images are provided for review. CT of the abdomen and pelvis was performed with and without the administration of intravenous contrast. Automated exposure control, iterative reconstruction, and/or weight based adjustment of the mA/kV was utilized to reduce the radiation dose to as low as reasonably achievable. COMPARISON: Findings were compared to prior studies from 2022  and 02/11/2022. CLINICAL HISTORY: Pulmonary embolism (PE) high prob. Nephrolithiasis, abdominal and flank pain, hyperbilirubinemia, pulmonary embolism. FINDINGS: CHEST: PULMONARY ARTERIES: Pulmonary arteries are adequately opacified for evaluation. No intraluminal filling defect to suggest pulmonary embolism. Main pulmonary artery is normal in caliber. MEDIASTINUM: Extensive multivessel coronary artery calcification. The heart and pericardium demonstrate no acute abnormality. 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 The descending thoracic aorta is of normal caliber. No mediastinal lymphadenopathy. LUNGS AND PLEURA: Large hiatal hernia containing the entire stomach which demonstrates organoaxial gastric volvulus, similar to prior examination, without obstruction. Resultant marked  elevation of the left hemidiaphragm, left basilar atelectasis and left-sided volume loss. Mild right basilar atelectasis. No pleural effusion or pneumothorax. SOFT TISSUES AND BONES: Osseous structures are age appropriate. No acute soft tissue abnormality. ABDOMEN AND PELVIS: LIVER: The liver is unremarkable. GALLBLADDER AND BILE DUCTS: Status post cholecystectomy. No biliary ductal dilatation. SPLEEN: Spleen demonstrates no acute abnormality. PANCREAS: Moderate fatty atrophy of the pancreas. There is increasing herniation of the proximal tail of the pancreas into the thoracic compartment. No evidence of superimposed inflammatory change, however. ADRENAL GLANDS: Adrenal glands demonstrate no acute abnormality. KIDNEYS, URETERS AND BLADDER: Mild asymmetric right renal cortical atrophy. Simple cortical cyst arises from the lower pole of the right kidney. No follow up imaging is recommended. Bilateral nonobstructing renal calculi are noted within the lower pole of the right kidney and within the renal pelvis bilaterally measuring up to 14 mm in the left renal pelvis and 16 mm within the right renal pelvis. No hydronephrosis. No ureteral calculi on the right. Nonobstructing 4 mm calculus within the mid left ureter at the level of the crossing iliac vasculature. 27 mm calculus noted dependently within the bladder lumen. 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 The bladder is not distended. 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 No perinephric or periureteral stranding. GI AND BOWEL: Large right inguinal hernia contains the terminal ileum which appears unremarkable. Moderate fat-containing left inguinal hernia. No evidence of obstruction or focal inflammation. Status post appendectomy. The duodenal  sweep demonstrates no acute abnormality. There is no bowel obstruction. No abnormal bowel wall thickening or distension. 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 PERITONEUM AND RETROPERITONEUM: No ascites or free air. LYMPH NODES: No lymphadenopathy. BONES AND SOFT TISSUES: Subacute to remote-appearing anterior wedge compression deformity of L3 with 50% loss of height, new from prior examination. Osseous structures are otherwise age appropriate. Moderate aortoiliac atherosclerotic calcification. No focal soft tissue abnormality. LIMITATIONS/ARTIFACTS: Imaging is limited by respiratory motion artifact. IMPRESSION: 1. No pulmonary embolism. 2. 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 3. 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 Recommend annual imaging follow-up by CTA or mra. This recommendation follows 2010 accf/aha/aats/ACR/asa/sca/scai/sir/sts/svm guidelines for the diagnosis and management of patients with thoracic aortic disease. Circulation. 2010; 121: [id number]. Aortic aneurysm nos (icd10-i71.9) 4. Bilateral nonobstructing renal calculi, with a 4 mm nonobstructing calculus in the mid left ureter and a 27 mm calculus in the bladder lumen. 5. Changes of chronic bladder outlet obstruction with trabeculated bladder and numerous diverticula, likely secondary to  prostatic hypertrophy. 6. Large right inguinal hernia containing the terminal ileum and moderate fat-containing left inguinal hernia, without obstruction or focal inflammation. 7. Subacute to remote-appearing anterior wedge compression deformity of L3 with 50% loss of height, new from prior examination. Correlation with clinical exam is recommended. If indicated, MRI examination will be more  helpful to better age the fracture. 8. Extensive multivessel coronary artery calcification. 9. Status post cholecystectomy and appendectomy. Electronically signed by: Dorethia Molt MD 05/22/2024 11:39 PM EST RP Workstation: [id number]   DG Chest Port 1 View Result Date: 05/22/2024 EXAM: 1 VIEW(S) XRAY OF THE CHEST 05/22/2024 08:51:07 PM COMPARISON: None available. CLINICAL HISTORY: Back pain. SOB (shortness of breath). FINDINGS: LUNGS AND PLEURA: Low lung volumes accentuate pulmonary vascularity. Elevated left hemidiaphragm. Left basilar atelectasis. No pleural effusion. No pneumothorax. HEART AND MEDIASTINUM: Low lung volumes accentuate the cardiomediastinal silhouette. Aortic atherosclerotic calcification is present. BONES AND SOFT TISSUES: Gaseous gastric distension. No acute osseous abnormality. IMPRESSION: 1. No acute cardiopulmonary abnormality. 2. Low lung volumes with left basilar atelectasis. 3. Large hiatal hernia with gaseous distention of the intrathoric stomach. Please correlate clinically. Electronically signed by: Greig Pique MD 05/22/2024 08:56 PM EST RP Workstation: [id number]           LOS: 1 day   Time spent= 35 mins    Deliliah Room, MD [facility]  If 7PM-7AM, please contact night-coverage  05/24/2024, 10:16 AM  "

## 2024-05-25 ENCOUNTER — Encounter (HOSPITAL_COMMUNITY): Payer: Self-pay | Admitting: Urology

## 2024-05-25 DIAGNOSIS — N390 Urinary tract infection, site not specified: Secondary | ICD-10-CM | POA: Diagnosis not present

## 2024-05-25 DIAGNOSIS — A419 Sepsis, unspecified organism: Secondary | ICD-10-CM | POA: Diagnosis not present

## 2024-05-25 LAB — CBC WITH DIFFERENTIAL/PLATELET
Abs Immature Granulocytes: 0.14 K/uL — ABNORMAL HIGH (ref 0.00–0.07)
Basophils Absolute: 0 K/uL (ref 0.0–0.1)
Basophils Relative: 0 %
Eosinophils Absolute: 0.1 K/uL (ref 0.0–0.5)
Eosinophils Relative: 1 %
HCT: 30.5 % — ABNORMAL LOW (ref 39.0–52.0)
Hemoglobin: 9.8 g/dL — ABNORMAL LOW (ref 13.0–17.0)
Immature Granulocytes: 1 %
Lymphocytes Relative: 10 %
Lymphs Abs: 1.2 K/uL (ref 0.7–4.0)
MCH: 30.2 pg (ref 26.0–34.0)
MCHC: 32.1 g/dL (ref 30.0–36.0)
MCV: 93.8 fL (ref 80.0–100.0)
Monocytes Absolute: 0.6 K/uL (ref 0.1–1.0)
Monocytes Relative: 5 %
Neutro Abs: 10 K/uL — ABNORMAL HIGH (ref 1.7–7.7)
Neutrophils Relative %: 83 %
Platelets: 88 K/uL — ABNORMAL LOW (ref 150–400)
RBC: 3.25 MIL/uL — ABNORMAL LOW (ref 4.22–5.81)
RDW: 14.7 % (ref 11.5–15.5)
WBC: 12.1 K/uL — ABNORMAL HIGH (ref 4.0–10.5)
nRBC: 0 % (ref 0.0–0.2)

## 2024-05-25 LAB — BASIC METABOLIC PANEL WITH GFR
Anion gap: 7 (ref 5–15)
BUN: 25 mg/dL — ABNORMAL HIGH (ref 8–23)
CO2: 19 mmol/L — ABNORMAL LOW (ref 22–32)
Calcium: 7.1 mg/dL — ABNORMAL LOW (ref 8.9–10.3)
Chloride: 115 mmol/L — ABNORMAL HIGH (ref 98–111)
Creatinine, Ser: 0.54 mg/dL — ABNORMAL LOW (ref 0.61–1.24)
GFR, Estimated: >60 mL/min
Glucose, Bld: 89 mg/dL (ref 70–99)
Potassium: 3.3 mmol/L — ABNORMAL LOW (ref 3.5–5.1)
Sodium: 141 mmol/L (ref 135–145)

## 2024-05-25 NOTE — Plan of Care (Signed)

## 2024-05-25 NOTE — Progress Notes (Signed)
 " PROGRESS NOTE    Gregory Kerr  FMW:[id number] DOB: 1940/10/08 DOA: 05/22/2024 PCP: Donnella Agreste   Brief Narrative:   84 y.o. male with medical history significant of hypertension, hyperlipidemia, diastolic congestive heart failure, CVA with residual deficits, MDR UTIs, BPH presented with left sided flank pain.   Urology consulted. s/p bilateral JJ stents 05/23/24 by Dr Elisabeth.   Being treated for sepsis, likely UTI, on IV antibiotics  F/u urine and blood cultures  Dispo is back to SNF once medically ready.  Assessment & Plan:  Principal Problem:   Sepsis secondary to UTI [facility]) Active Problems:   Urolithiasis   AKI (acute kidney injury)   Hiatal hernia   Gastric volvulus   History of CVA with residual deficit   BPH (benign prostatic hyperplasia)   AAA (abdominal aortic aneurysm)   Closed compression fracture of L3 vertebra (HCC)   Sepsis secondary to acute complicated urinary tract infection, Improving Urolithiasis CT noted bilateral renal pelvis calculi with left ureteral calculus without significant hydronephrosis and a large bladder stone.  Blood and urine cultures were obtained.  Initial lactic acid noted to be elevated at 4.5.  Urology evaluated the patient and he was taken to the operating room for cystoscopy with bilateral ureteral stent placement and left retrograde pyelography on 05/23/24. - Follow-up blood and urine cultures - Continue Rocephin  IV 2 g Q24H.  Adjust antibiotics as deemed medically appropriate - Trend lactic acid levels -WBC count today is 12 down from 23. -Ordered CBC and BMP to be checked in am.   Acute kidney injury, resolved now Creatinine elevated up to 1.12 with BUN 26 baseline creatinine previously noted to be around 0.7.  Patient had been bolused IV fluids. - Avoid nephrotoxic agents - Continue IV fluids at 75 mL/h    Gastric volvulus Hiatal hernia Appears chronic.  CT revealed large hiatal hernia containing entire stomach with gastric  volvulus without obstruction.  General surgery had been consulted but felt no acute intervention needed at this time. - Monitor intake and output - Continue Protonix  - Appreciate general surgery's input.   History of CVA with residual deficit/left sided hemiparesis Patient with residual deficits from prior CVA and is nonambulatory at baseline. - Consider resuming Plavix  when deemed medically appropriate - Continue baclofen , gabapentin , and Zoloft    BPH With obstruction noted on CT imaging. 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 - Recommend annual screening   Compression fracture Prior to arrival.  Patient was found to have a compression fracture of L3 with 50% height loss.  Disposition: SNF ([facility])   DVT prophylaxis: enoxaparin  (LOVENOX ) injection 40 mg Start: 05/23/24 1536     Code Status: Do not attempt resuscitation (DNR) PRE-ARREST INTERVENTIONS DESIRED Family Communication:  None at the bedside Status is: Inpatient Remains inpatient appropriate because: sepsis, UTI    Subjective:  No acute events overnight. Denied any active complaints. I spoke to him about his improving sepsis and down trending WBC count towards normal.   Examination:  General exam: Appears calm and comfortable  Respiratory system: Clear to auscultation. Respiratory effort normal. Cardiovascular system: S1 & S2 heard, RRR. No JVD, murmurs, rubs, gallops or clicks. No pedal edema. Gastrointestinal system: Abdomen is nondistended, soft and nontender. No organomegaly or masses felt. Normal bowel sounds heard. Central nervous system: Alert and oriented. No focal neurological deficits. Extremities: left sided  hemiparesis, left arm is flexed and adducted      Diet Orders (From admission, onward)     Start     Ordered   05/23/24 0959  Diet Heart Room service  appropriate? Yes; Fluid consistency: Thin  Diet effective now       Question Answer Comment  Room service appropriate? Yes   Fluid consistency: Thin      05/23/24 0958            Objective: Vitals:   05/24/24 1513 05/24/24 2029 05/25/24 0452 05/25/24 0730  BP: (!) 107/59 134/62 135/61 (!) 142/65  Pulse: 76 81 64 64  Resp:   16   Temp: 98.2 F (36.8 C) 97.8 F (36.6 C) 97.8 F (36.6 C) 97.8 F (36.6 C)  TempSrc:   Oral Oral  SpO2: 93% 96% 92% 96%  Weight:      Height:        Intake/Output Summary (Last 24 hours) at 05/25/2024 1007 Last data filed at 05/25/2024 0400 Gross per 24 hour  Intake 1288.78 ml  Output 1225 ml  Net 63.78 ml   Filed Weights   05/22/24 2028  Weight: 78.4 kg    Scheduled Meds:  atorvastatin   40 mg Oral QPM   baclofen   20 mg Oral TID   Chlorhexidine  Gluconate Cloth  6 each Topical Daily   Dextromethorphan -quiNIDine  1 capsule Oral QHS   enoxaparin  (LOVENOX ) injection  40 mg Subcutaneous Q24H   gabapentin   200 mg Oral TID   pantoprazole   20 mg Oral Daily   saccharomyces boulardii  250 mg Oral Q12H   sertraline   100 mg Oral Daily   sertraline   25 mg Oral Daily   tamsulosin   0.4 mg Oral QHS   Continuous Infusions:  sodium chloride  75 mL/hr at 05/24/24 1658   cefTRIAXone  (ROCEPHIN )  IV 2 g (05/25/24 0416)   lactated ringers      And   lactated ringers       Nutritional status     Body mass index is 27.9 kg/m.  Data Reviewed:   CBC: Recent Labs  Lab 05/22/24 2029 05/24/24 0526 05/25/24 0606  WBC 16.2* 23.4* 12.1*  NEUTROABS 15.6*  --  10.0*  HGB 15.8 11.8* 9.8*  HCT 48.1 36.0* 30.5*  MCV 90.9 91.8 93.8  PLT 195 102* 88*   Basic Metabolic Panel: Recent Labs  Lab 05/22/24 2029 05/24/24 0526 05/25/24 0606  NA 139 140 141  K 4.5 4.1 3.3*  CL 105 107 115*  CO2 18* 22 19*  GLUCOSE 150* 119* 89  BUN 26* 40* 25*  CREATININE 1.12 0.94 0.54*  CALCIUM  9.6 8.5* 7.1*   GFR: Estimated Creatinine Clearance: 71.3 mL/min (A)  (by C-G formula based on SCr of 0.54 mg/dL (L)). Liver Function Tests: Recent Labs  Lab 05/22/24 2029  AST 29  ALT 28  ALKPHOS 118  BILITOT 0.6  PROT 8.0  ALBUMIN 4.0   Recent Labs  Lab 05/22/24 2029  LIPASE <10*   No results for input(s): AMMONIA in the last 168 hours. Coagulation Profile: No results for input(s): INR, PROTIME in the last 168 hours. Cardiac Enzymes: No results for input(s): CKTOTAL, CKMB, CKMBINDEX, TROPONINI in the last 168 hours. BNP (last 3 results) No results for input(s): PROBNP in the last 8760 hours. HbA1C: No results for input(s): HGBA1C in the last 72 hours. CBG: Recent Labs  Lab 05/23/24 0207  GLUCAP 140*   Lipid Profile: No results for input(s): CHOL, HDL, LDLCALC, TRIG, CHOLHDL,  LDLDIRECT in the last 72 hours. Thyroid  Function Tests: No results for input(s): TSH, T4TOTAL, FREET4, T3FREE, THYROIDAB in the last 72 hours. Anemia Panel: No results for input(s): VITAMINB12, FOLATE, FERRITIN, TIBC, IRON, RETICCTPCT in the last 72 hours. Sepsis Labs: Recent Labs  Lab 05/23/24 0405  LATICACIDVEN 4.5*    Recent Results (from the past 240 hours)  Blood culture (routine x 2)     Status: None (Preliminary result)   Collection Time: 05/23/24  3:02 AM   Specimen: BLOOD  Result Value Ref Range Status   Specimen Description BLOOD SITE NOT SPECIFIED  Final   Special Requests   Final    BOTTLES DRAWN AEROBIC AND ANAEROBIC Blood Culture results may not be optimal due to an inadequate volume of blood received in culture bottles   Culture   Final    NO GROWTH 2 DAYS Performed at [facility], [address] [place], [place] [address]    Report Status PENDING  Incomplete  Urine Culture     Status: None (Preliminary result)   Collection Time: 05/23/24  4:28 AM   Specimen: Urine, Catheterized  Result Value Ref Range Status   Specimen Description URINE, CATHETERIZED  Final   Special Requests  NONE  Final   Culture   Final    CULTURE REINCUBATED FOR BETTER GROWTH Performed at [facility], [address], [place], [place] [address]    Report Status PENDING  Incomplete  Blood culture (routine x 2)     Status: None (Preliminary result)   Collection Time: 05/23/24  4:19 PM   Specimen: BLOOD  Result Value Ref Range Status   Specimen Description BLOOD SITE NOT SPECIFIED  Final   Special Requests   Final    BOTTLES DRAWN AEROBIC AND ANAEROBIC Blood Culture results may not be optimal due to an inadequate volume of blood received in culture bottles   Culture   Final    NO GROWTH 2 DAYS Performed at [facility], [address] [place], [place] [address]    Report Status PENDING  Incomplete         Radiology Studies: No results found.          LOS: 2 days   Time spent= 35 mins    Deliliah Room, MD [facility]  If 7PM-7AM, please contact night-coverage  05/25/2024, 10:07 AM  "

## 2024-05-26 DIAGNOSIS — N390 Urinary tract infection, site not specified: Secondary | ICD-10-CM | POA: Diagnosis not present

## 2024-05-26 DIAGNOSIS — A419 Sepsis, unspecified organism: Secondary | ICD-10-CM | POA: Diagnosis not present

## 2024-05-26 LAB — CBC WITH DIFFERENTIAL/PLATELET
Abs Immature Granulocytes: 0.14 K/uL — ABNORMAL HIGH (ref 0.00–0.07)
Basophils Absolute: 0 K/uL (ref 0.0–0.1)
Basophils Relative: 0 %
Eosinophils Absolute: 0.1 K/uL (ref 0.0–0.5)
Eosinophils Relative: 1 %
HCT: 33.9 % — ABNORMAL LOW (ref 39.0–52.0)
Hemoglobin: 11.1 g/dL — ABNORMAL LOW (ref 13.0–17.0)
Immature Granulocytes: 1 %
Lymphocytes Relative: 13 %
Lymphs Abs: 1.4 K/uL (ref 0.7–4.0)
MCH: 29.9 pg (ref 26.0–34.0)
MCHC: 32.7 g/dL (ref 30.0–36.0)
MCV: 91.4 fL (ref 80.0–100.0)
Monocytes Absolute: 0.7 K/uL (ref 0.1–1.0)
Monocytes Relative: 6 %
Neutro Abs: 8.7 K/uL — ABNORMAL HIGH (ref 1.7–7.7)
Neutrophils Relative %: 79 %
Platelets: 108 K/uL — ABNORMAL LOW (ref 150–400)
RBC: 3.71 MIL/uL — ABNORMAL LOW (ref 4.22–5.81)
RDW: 13.8 % (ref 11.5–15.5)
WBC: 11.1 K/uL — ABNORMAL HIGH (ref 4.0–10.5)
nRBC: 0 % (ref 0.0–0.2)

## 2024-05-26 LAB — URINE CULTURE: Culture: >=100000 — AB

## 2024-05-26 LAB — BLOOD CULTURE ID PANEL (REFLEXED) - BCID2

## 2024-05-26 MED ORDER — PIPERACILLIN-TAZOBACTAM 3.375 G IVPB
3.3750 g | Freq: Three times a day (TID) | INTRAVENOUS | Status: DC
  Administered 2024-05-26 – 2024-05-28 (×6): 3.375 g via INTRAVENOUS
  Filled 2024-05-26 (×6): qty 50

## 2024-05-26 MED ORDER — SODIUM CHLORIDE 0.9 % IV SOLN: 3.0000 g | Freq: Four times a day (QID) | INTRAVENOUS | Status: DC

## 2024-05-26 NOTE — Progress Notes (Signed)
" ° °  3 Days Post-Op Subjective: First time meeting Gregory Kerr.  We reviewed his case and plan.  No acute events overnight.  Objective: Vital signs in last 24 hours: Temp:  [97.9 F (36.6 C)-99.3 F (37.4 C)] 98.4 F (36.9 C) (01/05 1248) Pulse Rate:  [62-84] 65 (01/05 1248) Resp:  [20] 20 (01/05 0300) BP: (134-159)/(56-79) 154/56 (01/05 1248) SpO2:  [93 %-96 %] 95 % (01/05 1248)  Assessment/Plan: 1 - Bilateral Renal / Left Ureteral Stones- s/p bilateral JJ stents 05/23/24 by Elisabeth. Minimal hydro on ER CT prior.   2 - Possible Urosepsis -sepsis picture from suspect GU source. UCX/BCX 1/2 pending. Placed on Rocephin .  Ucx (+) Enterococcus and Proteus.  Continues on broad ABX at the time.  Reasonable to transition to oral treatment.  Patient remains afebrile with marked improvement in leukocytosis 23.4->11.1.  Consider removing Foley catheter tomorrow morning  Definitive stone management on an outpatient basis and 2 to 4 weeks after completion of ABX.  Schedulers should be contacting the patient but follow-up information has been placed in his discharge instructions.  Please call with questions.  Intake/Output from previous day: 01/04 0701 - 01/05 0700 In: 440 [P.O.:240; IV Piggyback:200] Out: 3050 [Urine:3050]  Intake/Output this shift: Total I/O In: 240 [P.O.:240] Out: 800 [Urine:800]  Physical Exam:  General: Alert and oriented CV: No cyanosis Lungs: equal chest rise Gu: Foley catheter in place draining clear yellow urine  Lab Results: Recent Labs    05/24/24 0526 05/25/24 0606 05/26/24 0426  HGB 11.8* 9.8* 11.1*  HCT 36.0* 30.5* 33.9*   BMET Recent Labs    05/24/24 0526 05/25/24 0606 05/26/24 0426  NA 140 141  --   K 4.1 3.3*  --   CL 107 115*  --   CO2 22 19*  --   GLUCOSE 119* 89  --   BUN 40* 25*  --   CREATININE 0.94 0.54*  --   CALCIUM  8.5* 7.1*  --   HGB 11.8* 9.8* 11.1*  WBC 23.4* 12.1* 11.1*     Studies/Results: No results found.    LOS: 3 days    Ole Bourdon, NP [facility] Pager: [phone]  05/26/2024, 12:53 PM  "

## 2024-05-26 NOTE — Progress Notes (Signed)
 PHARMACY - PHYSICIAN COMMUNICATION CRITICAL VALUE ALERT - BLOOD CULTURE IDENTIFICATION (BCID)  Gregory Kerr is an 84 y.o. male who presented to [facility] on 05/22/2024 with a chief complaint of left-sided flank pain s/p bilateral stents on 05/23/2024.   Current infectious workup is for UTI with Proteus mirabilis + Enterococcus faecalis in the urine culture. Patient's infectious markers are numerically stable (afebrile, wbc 11.1 - trending down).   Assessment:  1/4 bottles (aerobic), BCID = Pseudomonas aeruginosa   Name of physician (or Provider) Contacted: Dr. Dino   Current antibiotics: ampicillin gabino   Changes to prescribed antibiotics recommended:  - Discontinue ampicillin /sulbactam - Escalate to piperacillin /tazobactam 3.375 g IV Q8H (provider accepted recommendation)  Results for orders placed or performed during the hospital encounter of 10/17/22  Blood Culture ID Panel (Reflexed) (Collected: 10/17/2022  5:10 PM)  Result Value Ref Range   Enterococcus faecalis NOT DETECTED NOT DETECTED   Enterococcus Faecium NOT DETECTED NOT DETECTED   Listeria monocytogenes NOT DETECTED NOT DETECTED   Staphylococcus species NOT DETECTED NOT DETECTED   Staphylococcus aureus (BCID) NOT DETECTED NOT DETECTED   Staphylococcus epidermidis NOT DETECTED NOT DETECTED   Staphylococcus lugdunensis NOT DETECTED NOT DETECTED   Streptococcus species NOT DETECTED NOT DETECTED   Streptococcus agalactiae NOT DETECTED NOT DETECTED   Streptococcus pneumoniae NOT DETECTED NOT DETECTED   Streptococcus pyogenes NOT DETECTED NOT DETECTED   A.calcoaceticus-baumannii NOT DETECTED NOT DETECTED   Bacteroides fragilis NOT DETECTED NOT DETECTED   Enterobacterales DETECTED (A) NOT DETECTED   Enterobacter cloacae complex NOT DETECTED NOT DETECTED   Escherichia coli NOT DETECTED NOT DETECTED   Klebsiella aerogenes NOT DETECTED NOT DETECTED   Klebsiella oxytoca NOT DETECTED NOT DETECTED   Klebsiella pneumoniae NOT  DETECTED NOT DETECTED   Proteus species DETECTED (A) NOT DETECTED   Salmonella species NOT DETECTED NOT DETECTED   Serratia marcescens NOT DETECTED NOT DETECTED   Haemophilus influenzae NOT DETECTED NOT DETECTED   Neisseria meningitidis NOT DETECTED NOT DETECTED   Pseudomonas aeruginosa NOT DETECTED NOT DETECTED   Stenotrophomonas maltophilia NOT DETECTED NOT DETECTED   Candida albicans NOT DETECTED NOT DETECTED   Candida auris NOT DETECTED NOT DETECTED   Candida glabrata NOT DETECTED NOT DETECTED   Candida krusei NOT DETECTED NOT DETECTED   Candida parapsilosis NOT DETECTED NOT DETECTED   Candida tropicalis NOT DETECTED NOT DETECTED   Cryptococcus neoformans/gattii NOT DETECTED NOT DETECTED   CTX-M ESBL NOT DETECTED NOT DETECTED   Carbapenem resistance IMP NOT DETECTED NOT DETECTED   Carbapenem resistance KPC NOT DETECTED NOT DETECTED   Carbapenem resistance NDM NOT DETECTED NOT DETECTED   Carbapenem resist OXA 48 LIKE NOT DETECTED NOT DETECTED   Carbapenem resistance VIM NOT DETECTED NOT DETECTED    Feliciano GORMAN Close 05/26/2024  10:10 AM

## 2024-05-26 NOTE — Care Management Important Message (Signed)
 Important Message  Patient Details  Name: Gregory Kerr MRN: [id number] Date of Birth: [birth year]   Important Message Given:  Yes - Medicare IM     Claretta Deed 05/26/2024, 2:18 PM

## 2024-05-26 NOTE — Plan of Care (Signed)

## 2024-05-26 NOTE — Progress Notes (Addendum)
 " PROGRESS NOTE    Gregory Kerr  FMW:[id number] DOB: 1941-05-01 DOA: 05/22/2024 PCP: Donnella Agreste   Brief Narrative:   84 y.o. male with medical history significant of hypertension, hyperlipidemia, diastolic congestive heart failure, CVA with residual deficits, MDR UTIs, BPH presented with left sided flank pain.   Urology consulted. s/p bilateral JJ stents 05/23/24 by Dr Elisabeth.   Being treated for sepsis, likely UTI, on IV antibiotics  F/u urine (Proteus and Enterococcus) and blood cultures (Pseudomonas)  Dispo is back to SNF once medically ready.  Assessment & Plan:  Principal Problem:   Sepsis secondary to UTI [facility]) Active Problems:   Urolithiasis   AKI (acute kidney injury)   Hiatal hernia   Gastric volvulus   History of CVA with residual deficit   BPH (benign prostatic hyperplasia)   AAA (abdominal aortic aneurysm)   Closed compression fracture of L3 vertebra (HCC)   Sepsis secondary to acute complicated urinary tract infection, Improving Urolithiasis Acute UTI secondary to Proteus and Enterococcus CT noted bilateral renal pelvis calculi with left ureteral calculus without significant hydronephrosis and a large bladder stone.  Blood and urine cultures were obtained.  Initial lactic acid noted to be elevated at 4.5.  Urology evaluated the patient and he was taken to the operating room for cystoscopy with bilateral ureteral stent placement and left retrograde pyelography on 05/23/24. - Changed IV Ceftriaxone  to Zosyn  on 1/5 after talking to the pharmacist. - Trend lactic acid levels -WBC count today is 11   Pseudomonas bacteremia,POA: Switched to IV Zosyn  on 1/5.     Acute kidney injury, resolved now Creatinine elevated up to 1.12 with BUN 26 baseline creatinine previously noted to be around 0.7.  Patient had been bolused IV fluids. - Avoid nephrotoxic agents - Dced fluids    Gastric volvulus Hiatal hernia Appears chronic.  CT revealed large hiatal hernia containing  entire stomach with gastric volvulus without obstruction.  General surgery had been consulted but felt no acute intervention needed at this time. - Monitor intake and output - Continue Protonix  - Appreciate general surgery's input.   History of CVA with residual deficit/left sided hemiparesis Patient with residual deficits from prior CVA and is nonambulatory at baseline. - Consider resuming Plavix  when deemed medically appropriate - Continue baclofen , gabapentin , and Zoloft    BPH With obstruction noted on CT imaging. 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 - Recommend annual screening   Compression fracture Prior to arrival.  Patient was found to have a compression fracture of L3 with 50% height loss.  Disposition: SNF ([facility])   DVT prophylaxis: enoxaparin  (LOVENOX ) injection 40 mg Start: 05/23/24 1536     Code Status: Do not attempt resuscitation (DNR) PRE-ARREST INTERVENTIONS DESIRED Family Communication:  None at the bedside Status is: Inpatient Remains inpatient appropriate because: sepsis, UTI    Subjective:  No acute events overnight. Denied any active complaints. We spoke about his urine and blood culture results and the need to adjust his antibiotics today.  Examination:  General exam: Appears calm and comfortable  Respiratory system: Clear to auscultation. Respiratory effort normal. Cardiovascular system: S1 & S2 heard, RRR. No JVD, murmurs, rubs, gallops or clicks. No pedal edema. Gastrointestinal system: Abdomen is nondistended, soft and nontender. No organomegaly or masses felt. Normal bowel sounds heard. Central nervous system: Alert and oriented. No focal neurological deficits. Extremities: left  sided hemiparesis, left arm is flexed and adducted      Diet Orders (From admission, onward)     Start     Ordered   05/23/24  0959  Diet Heart Room service appropriate? Yes; Fluid consistency: Thin  Diet effective now       Question Answer Comment  Room service appropriate? Yes   Fluid consistency: Thin      05/23/24 0958            Objective: Vitals:   05/25/24 1458 05/25/24 1948 05/25/24 2359 05/26/24 0300  BP: (!) 159/69 (!) 154/79 (!) 149/66 134/61  Pulse: 81 84 73 62  Resp:  20 20 20   Temp:  99.3 F (37.4 C) 97.9 F (36.6 C) 98.2 F (36.8 C)  TempSrc:  Oral Oral Oral  SpO2: 93% 94% 96% 94%  Weight:      Height:        Intake/Output Summary (Last 24 hours) at 05/26/2024 0910 Last data filed at 05/26/2024 9161 Gross per 24 hour  Intake 680 ml  Output 3500 ml  Net -2820 ml   Filed Weights   05/22/24 2028  Weight: 78.4 kg    Scheduled Meds:  atorvastatin   40 mg Oral QPM   baclofen   20 mg Oral TID   Chlorhexidine  Gluconate Cloth  6 each Topical Daily   Dextromethorphan -quiNIDine  1 capsule Oral QHS   enoxaparin  (LOVENOX ) injection  40 mg Subcutaneous Q24H   gabapentin   200 mg Oral TID   pantoprazole   20 mg Oral Daily   saccharomyces boulardii  250 mg Oral Q12H   sertraline   100 mg Oral Daily   sertraline   25 mg Oral Daily   tamsulosin   0.4 mg Oral QHS   Continuous Infusions:  cefTRIAXone  (ROCEPHIN )  IV 2 g (05/26/24 0254)   lactated ringers      And   lactated ringers       Nutritional status     Body mass index is 27.9 kg/m.  Data Reviewed:   CBC: Recent Labs  Lab 05/22/24 2029 05/24/24 0526 05/25/24 0606 05/26/24 0426  WBC 16.2* 23.4* 12.1* 11.1*  NEUTROABS 15.6*  --  10.0* 8.7*  HGB 15.8 11.8* 9.8* 11.1*  HCT 48.1 36.0* 30.5* 33.9*  MCV 90.9 91.8 93.8 91.4  PLT 195 102* 88* 108*   Basic Metabolic Panel: Recent Labs  Lab 05/22/24 2029 05/24/24 0526 05/25/24 0606  NA 139 140 141  K 4.5 4.1 3.3*  CL 105 107 115*  CO2 18* 22 19*  GLUCOSE 150* 119* 89  BUN 26* 40* 25*  CREATININE 1.12 0.94 0.54*  CALCIUM  9.6 8.5* 7.1*   GFR: Estimated Creatinine  Clearance: 71.3 mL/min (A) (by C-G formula based on SCr of 0.54 mg/dL (L)). Liver Function Tests: Recent Labs  Lab 05/22/24 2029  AST 29  ALT 28  ALKPHOS 118  BILITOT 0.6  PROT 8.0  ALBUMIN 4.0   Recent Labs  Lab 05/22/24 2029  LIPASE <10*   No results for input(s): AMMONIA in the last 168 hours. Coagulation Profile: No results for input(s): INR, PROTIME in the last 168 hours. Cardiac Enzymes: No results for input(s): CKTOTAL, CKMB, CKMBINDEX, TROPONINI in the last 168 hours. BNP (last 3 results) No results for input(s): PROBNP in the last 8760 hours. HbA1C: No results for input(s): HGBA1C in the last 72 hours. CBG: Recent Labs  Lab 05/23/24 0207  GLUCAP 140*   Lipid Profile: No results for input(s): CHOL, HDL, LDLCALC, TRIG, CHOLHDL, LDLDIRECT in  the last 72 hours. Thyroid  Function Tests: No results for input(s): TSH, T4TOTAL, FREET4, T3FREE, THYROIDAB in the last 72 hours. Anemia Panel: No results for input(s): VITAMINB12, FOLATE, FERRITIN, TIBC, IRON, RETICCTPCT in the last 72 hours. Sepsis Labs: Recent Labs  Lab 05/23/24 0405  LATICACIDVEN 4.5*    Recent Results (from the past 240 hours)  Blood culture (routine x 2)     Status: None (Preliminary result)   Collection Time: 05/23/24  3:02 AM   Specimen: BLOOD  Result Value Ref Range Status   Specimen Description BLOOD SITE NOT SPECIFIED  Final   Special Requests   Final    BOTTLES DRAWN AEROBIC AND ANAEROBIC Blood Culture results may not be optimal due to an inadequate volume of blood received in culture bottles   Culture  Setup Time   Final    GRAM NEGATIVE RODS AEROBIC BOTTLE ONLY Organism ID to follow Performed at [facility], [address], [place], [place] [address]    Culture GRAM NEGATIVE RODS  Final   Report Status PENDING  Incomplete  Urine Culture     Status: Abnormal   Collection Time: 05/23/24  4:28 AM   Specimen: Urine,  Catheterized  Result Value Ref Range Status   Specimen Description URINE, CATHETERIZED  Final   Special Requests   Final    NONE Performed at [facility], [address], [place], [place] [address]    Culture (A)  Final    >=100,000 COLONIES/mL PROTEUS MIRABILIS >=100,000 COLONIES/mL ENTEROCOCCUS FAECALIS    Report Status 05/26/2024 FINAL  Final   Organism ID, Bacteria PROTEUS MIRABILIS (A)  Final   Organism ID, Bacteria ENTEROCOCCUS FAECALIS (A)  Final      Susceptibility   Enterococcus faecalis - MIC*    AMPICILLIN  <=2 SENSITIVE Sensitive     NITROFURANTOIN  <=16 SENSITIVE Sensitive     VANCOMYCIN  1 SENSITIVE Sensitive     * >=100,000 COLONIES/mL ENTEROCOCCUS FAECALIS   Proteus mirabilis - MIC*    AMPICILLIN  <=2 SENSITIVE Sensitive     CEFAZOLIN  (URINE) Value in next row Sensitive      2 SENSITIVEThis is a modified FDA-approved test that has been validated and its performance characteristics determined by the reporting laboratory.  This laboratory is certified under the Clinical Laboratory Improvement Amendments CLIA as qualified to perform high complexity clinical laboratory testing.    CEFEPIME  Value in next row Sensitive      2 SENSITIVEThis is a modified FDA-approved test that has been validated and its performance characteristics determined by the reporting laboratory.  This laboratory is certified under the Clinical Laboratory Improvement Amendments CLIA as qualified to perform high complexity clinical laboratory testing.    ERTAPENEM Value in next row Sensitive      2 SENSITIVEThis is a modified FDA-approved test that has been validated and its performance characteristics determined by the reporting laboratory.  This laboratory is certified under the Clinical Laboratory Improvement Amendments CLIA as qualified to perform high complexity clinical laboratory testing.    CEFTRIAXONE  Value in next row Sensitive      2 SENSITIVEThis is a modified FDA-approved test that has been  validated and its performance characteristics determined by the reporting laboratory.  This laboratory is certified under the Clinical Laboratory Improvement Amendments CLIA as qualified to perform high complexity clinical laboratory testing.    CIPROFLOXACIN  Value in next row Resistant      2 SENSITIVEThis is a modified FDA-approved test that has been validated and its  performance characteristics determined by the reporting laboratory.  This laboratory is certified under the Clinical Laboratory Improvement Amendments CLIA as qualified to perform high complexity clinical laboratory testing.    GENTAMICIN Value in next row Sensitive      2 SENSITIVEThis is a modified FDA-approved test that has been validated and its performance characteristics determined by the reporting laboratory.  This laboratory is certified under the Clinical Laboratory Improvement Amendments CLIA as qualified to perform high complexity clinical laboratory testing.    NITROFURANTOIN  Value in next row Resistant      2 SENSITIVEThis is a modified FDA-approved test that has been validated and its performance characteristics determined by the reporting laboratory.  This laboratory is certified under the Clinical Laboratory Improvement Amendments CLIA as qualified to perform high complexity clinical laboratory testing.    TRIMETH /SULFA  Value in next row Sensitive      2 SENSITIVEThis is a modified FDA-approved test that has been validated and its performance characteristics determined by the reporting laboratory.  This laboratory is certified under the Clinical Laboratory Improvement Amendments CLIA as qualified to perform high complexity clinical laboratory testing.    AMPICILLIN /SULBACTAM Value in next row Sensitive      2 SENSITIVEThis is a modified FDA-approved test that has been validated and its performance characteristics determined by the reporting laboratory.  This laboratory is certified under the Clinical Laboratory Improvement  Amendments CLIA as qualified to perform high complexity clinical laboratory testing.    PIP/TAZO Value in next row Sensitive      <=4 SENSITIVEThis is a modified FDA-approved test that has been validated and its performance characteristics determined by the reporting laboratory.  This laboratory is certified under the Clinical Laboratory Improvement Amendments CLIA as qualified to perform high complexity clinical laboratory testing.    MEROPENEM Value in next row Sensitive      <=4 SENSITIVEThis is a modified FDA-approved test that has been validated and its performance characteristics determined by the reporting laboratory.  This laboratory is certified under the Clinical Laboratory Improvement Amendments CLIA as qualified to perform high complexity clinical laboratory testing.    * >=100,000 COLONIES/mL PROTEUS MIRABILIS  Blood culture (routine x 2)     Status: None (Preliminary result)   Collection Time: 05/23/24  4:19 PM   Specimen: BLOOD  Result Value Ref Range Status   Specimen Description BLOOD SITE NOT SPECIFIED  Final   Special Requests   Final    BOTTLES DRAWN AEROBIC AND ANAEROBIC Blood Culture results may not be optimal due to an inadequate volume of blood received in culture bottles   Culture   Final    NO GROWTH 3 DAYS Performed at [facility], [address] [place], [place] [address]    Report Status PENDING  Incomplete         Radiology Studies: No results found.          LOS: 3 days   Time spent= 35 mins    Deliliah Room, MD [facility]  If 7PM-7AM, please contact night-coverage  05/26/2024, 9:10 AM  "

## 2024-05-27 DIAGNOSIS — Z1624 Resistance to multiple antibiotics: Secondary | ICD-10-CM

## 2024-05-27 DIAGNOSIS — R1032 Left lower quadrant pain: Secondary | ICD-10-CM

## 2024-05-27 DIAGNOSIS — I5030 Unspecified diastolic (congestive) heart failure: Secondary | ICD-10-CM | POA: Diagnosis not present

## 2024-05-27 DIAGNOSIS — E785 Hyperlipidemia, unspecified: Secondary | ICD-10-CM | POA: Diagnosis not present

## 2024-05-27 DIAGNOSIS — A419 Sepsis, unspecified organism: Secondary | ICD-10-CM

## 2024-05-27 DIAGNOSIS — I69354 Hemiplegia and hemiparesis following cerebral infarction affecting left non-dominant side: Secondary | ICD-10-CM

## 2024-05-27 DIAGNOSIS — I110 Hypertensive heart disease with heart failure: Secondary | ICD-10-CM

## 2024-05-27 DIAGNOSIS — N390 Urinary tract infection, site not specified: Secondary | ICD-10-CM | POA: Diagnosis not present

## 2024-05-27 LAB — CBC WITH DIFFERENTIAL/PLATELET
Abs Immature Granulocytes: 0.19 K/uL — ABNORMAL HIGH (ref 0.00–0.07)
Basophils Absolute: 0.1 K/uL (ref 0.0–0.1)
Basophils Relative: 1 %
Eosinophils Absolute: 0.2 K/uL (ref 0.0–0.5)
Eosinophils Relative: 2 %
HCT: 37.5 % — ABNORMAL LOW (ref 39.0–52.0)
Hemoglobin: 12.7 g/dL — ABNORMAL LOW (ref 13.0–17.0)
Immature Granulocytes: 2 %
Lymphocytes Relative: 17 %
Lymphs Abs: 1.4 K/uL (ref 0.7–4.0)
MCH: 30.1 pg (ref 26.0–34.0)
MCHC: 33.9 g/dL (ref 30.0–36.0)
MCV: 88.9 fL (ref 80.0–100.0)
Monocytes Absolute: 0.5 K/uL (ref 0.1–1.0)
Monocytes Relative: 6 %
Neutro Abs: 6.2 K/uL (ref 1.7–7.7)
Neutrophils Relative %: 72 %
Platelets: 123 K/uL — ABNORMAL LOW (ref 150–400)
RBC: 4.22 MIL/uL (ref 4.22–5.81)
RDW: 13.7 % (ref 11.5–15.5)
WBC: 8.6 K/uL (ref 4.0–10.5)
nRBC: 0 % (ref 0.0–0.2)

## 2024-05-27 LAB — BASIC METABOLIC PANEL WITH GFR
Anion gap: 8 (ref 5–15)
BUN: 20 mg/dL (ref 8–23)
CO2: 25 mmol/L (ref 22–32)
Calcium: 9 mg/dL (ref 8.9–10.3)
Chloride: 106 mmol/L (ref 98–111)
Creatinine, Ser: 0.62 mg/dL (ref 0.61–1.24)
GFR, Estimated: >60 mL/min
Glucose, Bld: 102 mg/dL — ABNORMAL HIGH (ref 70–99)
Potassium: 4.3 mmol/L (ref 3.5–5.1)
Sodium: 139 mmol/L (ref 135–145)

## 2024-05-27 NOTE — Plan of Care (Signed)

## 2024-05-27 NOTE — Consult Note (Signed)
 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 He has no longer had any sx of bladder infection   Plan: Finish total 7 days of pseudomonas coverage from 05/27/23 until 06/02/23 F/u blood cx sensitivity Continue contact isolation precaution per IP protocol for mdro      ------------------------------------------------ Principal Problem:   Sepsis secondary to UTI [facility]) Active Problems:   Hiatal hernia   History of CVA with residual deficit   Urolithiasis   AKI (acute kidney injury)   Gastric volvulus   BPH (benign prostatic hyperplasia)   AAA (abdominal aortic aneurysm)   Closed compression fracture of L3 vertebra (HCC)    HPI: Gregory Kerr is a 84 y.o. 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 Pseudomonal coverage started on hd #5  He feels a lot better since admission       Family History  Problem Relation Age of Onset   Heart attack Mother    Heart attack Father    Cancer Maternal Aunt        Stomach   Cancer Paternal Aunt        Uterine,Colon    Social History[1]  Allergies[2]  Review of Systems: ROS All Other ROS was negative, except mentioned above   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   Pneumonia    once or twice (09/22/2014)   Retinal detachment    left eye   Stroke (HCC) 2007   left side doesn't work now (09/22/2014)       Scheduled Meds:  atorvastatin   40 mg Oral QPM   baclofen   20 mg Oral TID   Chlorhexidine  Gluconate Cloth  6 each Topical Daily   Dextromethorphan -quiNIDine  1 capsule Oral QHS   enoxaparin  (LOVENOX ) injection  40 mg Subcutaneous Q24H   gabapentin   200 mg Oral TID   pantoprazole   20 mg Oral Daily   saccharomyces boulardii  250 mg Oral Q12H   sertraline   100 mg Oral Daily   sertraline   25 mg Oral Daily   tamsulosin   0.4 mg Oral QHS   Continuous Infusions:  lactated ringers      And   lactated ringers      piperacillin -tazobactam (ZOSYN )  IV 3.375 g (05/27/24 1413)   PRN Meds:.acetaminophen  **OR** acetaminophen , albuterol , bisacodyl , HYDROcodone -acetaminophen , loperamide , LORazepam , polyethylene glycol, traZODone    OBJECTIVE: Blood pressure (!) 160/56, pulse 79, temperature 97.9 F (36.6 C), resp. rate 19, height 5' 6 (1.676 m), weight 78.4 kg, SpO2 94%.  Physical Exam General/constitutional: no distress, pleasant HEENT: Normocephalic, PER, Conj Clear, EOMI, Oropharynx clear Neck supple CV: rrr no mrg Lungs: clear to  auscultation, normal respiratory effort Abd: Soft, Nontender Ext: no edema Skin: No Rash Neuro: left sided flexion contracture MSK: no peripheral joint swelling/tenderness/warmth; back spines nontender     Lab Results Lab Results  Component Value Date   WBC 8.6 05/27/2024   HGB 12.7 (L) 05/27/2024   HCT 37.5 (L) 05/27/2024   MCV 88.9 05/27/2024   PLT 123 (L) 05/27/2024    Lab Results  Component Value Date   CREATININE 0.62 05/27/2024   BUN 20 05/27/2024   NA 139 05/27/2024   K 4.3 05/27/2024   CL 106 05/27/2024   CO2 25 05/27/2024    Lab Results  Component Value Date   ALT 28 05/22/2024   AST 29 05/22/2024   ALKPHOS 118 05/22/2024   BILITOT 0.6 05/22/2024      Microbiology: Recent Results (from the past 240 hours)  Blood culture (routine x 2)     Status: Abnormal (Preliminary result)   Collection Time: 05/23/24  3:02 AM   Specimen: BLOOD  Result Value Ref Range Status   Specimen Description BLOOD SITE NOT SPECIFIED  Final   Special Requests   Final    BOTTLES DRAWN AEROBIC AND ANAEROBIC Blood Culture results may not be optimal due to an inadequate volume of blood received in culture bottles   Culture  Setup Time   Final    GRAM NEGATIVE RODS AEROBIC BOTTLE ONLY CRITICAL RESULT CALLED TO, READ BACK BY AND VERIFIED WITH: PHARMD WANNARAT [id number] AT 1007 AM BY CM    Culture (A)  Final    PSEUDOMONAS AERUGINOSA SUSCEPTIBILITIES TO FOLLOW Performed at [facility], [address] [place], [place] [address]    Report Status PENDING  Incomplete  Blood Culture ID Panel (Reflexed)     Status: Abnormal   Collection Time: 05/23/24  3:02 AM  Result Value Ref Range Status   Enterococcus faecalis NOT DETECTED NOT DETECTED Final   Enterococcus Faecium NOT DETECTED NOT DETECTED Final   Listeria monocytogenes NOT DETECTED NOT DETECTED Final   Staphylococcus species NOT DETECTED NOT DETECTED Final   Staphylococcus aureus (BCID) NOT DETECTED NOT DETECTED Final    Staphylococcus epidermidis NOT DETECTED NOT DETECTED Final   Staphylococcus lugdunensis NOT DETECTED NOT DETECTED Final   Streptococcus species NOT DETECTED NOT DETECTED Final   Streptococcus agalactiae NOT DETECTED NOT DETECTED Final   Streptococcus pneumoniae NOT DETECTED NOT DETECTED Final   Streptococcus pyogenes NOT DETECTED NOT DETECTED Final   A.calcoaceticus-baumannii NOT DETECTED NOT DETECTED Final   Bacteroides fragilis NOT DETECTED NOT DETECTED Final   Enterobacterales NOT DETECTED NOT DETECTED Final   Enterobacter cloacae complex NOT DETECTED NOT DETECTED Final   Escherichia coli NOT DETECTED NOT DETECTED Final   Klebsiella aerogenes NOT DETECTED NOT DETECTED Final   Klebsiella oxytoca NOT DETECTED NOT DETECTED Final   Klebsiella  pneumoniae NOT DETECTED NOT DETECTED Final   Proteus species NOT DETECTED NOT DETECTED Final   Salmonella species NOT DETECTED NOT DETECTED Final   Serratia marcescens NOT DETECTED NOT DETECTED Final   Haemophilus influenzae NOT DETECTED NOT DETECTED Final   Neisseria meningitidis NOT DETECTED NOT DETECTED Final   Pseudomonas aeruginosa DETECTED (A) NOT DETECTED Final    Comment: CRITICAL RESULT CALLED TO, READ BACK BY AND VERIFIED WITH: PHARMD EANNARAT [id number] AT 1007 AM  BY CM    Stenotrophomonas maltophilia NOT DETECTED NOT DETECTED Final   Candida albicans NOT DETECTED NOT DETECTED Final   Candida auris NOT DETECTED NOT DETECTED Final   Candida glabrata NOT DETECTED NOT DETECTED Final   Candida krusei NOT DETECTED NOT DETECTED Final   Candida parapsilosis NOT DETECTED NOT DETECTED Final   Candida tropicalis NOT DETECTED NOT DETECTED Final   Cryptococcus neoformans/gattii NOT DETECTED NOT DETECTED Final   CTX-M ESBL NOT DETECTED NOT DETECTED Final   Carbapenem resistance IMP NOT DETECTED NOT DETECTED Final   Carbapenem resistance KPC NOT DETECTED NOT DETECTED Final   Carbapenem resistance NDM NOT DETECTED NOT DETECTED Final   Carbapenem  resistance VIM NOT DETECTED NOT DETECTED Final    Comment: Performed at [facility], [address] [place], [place] [address]  Urine Culture     Status: Abnormal   Collection Time: 05/23/24  4:28 AM   Specimen: Urine, Catheterized  Result Value Ref Range Status   Specimen Description URINE, CATHETERIZED  Final   Special Requests   Final    NONE Performed at [facility], [address] [place], [place] [address]    Culture (A)  Final    >=100,000 COLONIES/mL PROTEUS MIRABILIS >=100,000 COLONIES/mL ENTEROCOCCUS FAECALIS    Report Status 05/26/2024 FINAL  Final   Organism ID, Bacteria PROTEUS MIRABILIS (A)  Final   Organism ID, Bacteria ENTEROCOCCUS FAECALIS (A)  Final      Susceptibility   Enterococcus faecalis - MIC*    AMPICILLIN  <=2 SENSITIVE Sensitive     NITROFURANTOIN  <=16 SENSITIVE Sensitive     VANCOMYCIN  1 SENSITIVE Sensitive     * >=100,000 COLONIES/mL ENTEROCOCCUS FAECALIS   Proteus mirabilis - MIC*    AMPICILLIN  <=2 SENSITIVE Sensitive     CEFAZOLIN  (URINE) Value in next row Sensitive      2 SENSITIVEThis is a modified FDA-approved test that has been validated and its performance characteristics determined by the reporting laboratory.  This laboratory is certified under the Clinical Laboratory Improvement Amendments CLIA as qualified to perform high complexity clinical laboratory testing.    CEFEPIME  Value in next row Sensitive      2 SENSITIVEThis is a modified FDA-approved test that has been validated and its performance characteristics determined by the reporting laboratory.  This laboratory is certified under the Clinical Laboratory Improvement Amendments CLIA as qualified to perform high complexity clinical laboratory testing.    ERTAPENEM Value in next row Sensitive      2 SENSITIVEThis is a modified FDA-approved test that has been validated and its performance characteristics determined by the reporting laboratory.  This laboratory is certified under  the Clinical Laboratory Improvement Amendments CLIA as qualified to perform high complexity clinical laboratory testing.    CEFTRIAXONE  Value in next row Sensitive      2 SENSITIVEThis is a modified FDA-approved test that has been validated and its performance characteristics determined by the reporting laboratory.  This laboratory is certified under the Clinical Laboratory Improvement Amendments  CLIA as qualified to perform high complexity clinical laboratory testing.    CIPROFLOXACIN  Value in next row Resistant      2 SENSITIVEThis is a modified FDA-approved test that has been validated and its performance characteristics determined by the reporting laboratory.  This laboratory is certified under the Clinical Laboratory Improvement Amendments CLIA as qualified to perform high complexity clinical laboratory testing.    GENTAMICIN Value in next row Sensitive      2 SENSITIVEThis is a modified FDA-approved test that has been validated and its performance characteristics determined by the reporting laboratory.  This laboratory is certified under the Clinical Laboratory Improvement Amendments CLIA as qualified to perform high complexity clinical laboratory testing.    NITROFURANTOIN  Value in next row Resistant      2 SENSITIVEThis is a modified FDA-approved test that has been validated and its performance characteristics determined by the reporting laboratory.  This laboratory is certified under the Clinical Laboratory Improvement Amendments CLIA as qualified to perform high complexity clinical laboratory testing.    TRIMETH /SULFA  Value in next row Sensitive      2 SENSITIVEThis is a modified FDA-approved test that has been validated and its performance characteristics determined by the reporting laboratory.  This laboratory is certified under the Clinical Laboratory Improvement Amendments CLIA as qualified to perform high complexity clinical laboratory testing.    AMPICILLIN /SULBACTAM Value in next row  Sensitive      2 SENSITIVEThis is a modified FDA-approved test that has been validated and its performance characteristics determined by the reporting laboratory.  This laboratory is certified under the Clinical Laboratory Improvement Amendments CLIA as qualified to perform high complexity clinical laboratory testing.    PIP/TAZO Value in next row Sensitive      <=4 SENSITIVEThis is a modified FDA-approved test that has been validated and its performance characteristics determined by the reporting laboratory.  This laboratory is certified under the Clinical Laboratory Improvement Amendments CLIA as qualified to perform high complexity clinical laboratory testing.    MEROPENEM Value in next row Sensitive      <=4 SENSITIVEThis is a modified FDA-approved test that has been validated and its performance characteristics determined by the reporting laboratory.  This laboratory is certified under the Clinical Laboratory Improvement Amendments CLIA as qualified to perform high complexity clinical laboratory testing.    * >=100,000 COLONIES/mL PROTEUS MIRABILIS  Blood culture (routine x 2)     Status: None (Preliminary result)   Collection Time: 05/23/24  4:19 PM   Specimen: BLOOD  Result Value Ref Range Status   Specimen Description BLOOD SITE NOT SPECIFIED  Final   Special Requests   Final    BOTTLES DRAWN AEROBIC AND ANAEROBIC Blood Culture results may not be optimal due to an inadequate volume of blood received in culture bottles   Culture   Final    NO GROWTH 4 DAYS Performed at [facility], [address] [place], [place] [address]    Report Status PENDING  Incomplete     Serology:    Imaging: If present, new imagings (plain films, ct scans, and mri) have been personally visualized and interpreted; radiology reports have been reviewed. Decision making incorporated into the Impression / Recommendations.   1/01 ct angio chest pe protocol; ct abd pelv 1. No pulmonary embolism. 2.  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 3. 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 Recommend annual imaging follow-up  by CTA or mra. This recommendation follows 2010 accf/aha/aats/ACR/asa/sca/scai/sir/sts/svm guidelines for the diagnosis and management of patients with thoracic aortic disease. Circulation. 2010; 121: Z733-z630. Aortic aneurysm nos (icd10-i71.9) 4. Bilateral nonobstructing renal calculi, with a 4 mm nonobstructing calculus in the mid left ureter and a 27 mm calculus in the bladder lumen. 5. 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 6. Large right inguinal hernia containing the terminal ileum and moderate fat-containing left inguinal hernia, without obstruction or focal inflammation. 7. Subacute to remote-appearing anterior wedge compression deformity of L3 with 50% loss of height, new from prior examination. Correlation with clinical exam is recommended. If indicated, MRI examination will be more helpful to better age the fracture. 8. Extensive multivessel coronary artery calcification. 9. Status post cholecystectomy and appendectomy.   Constance ONEIDA Passer, MD [facility] [phone] pager    05/27/2024, 4:27 PM     [1]  Social History Tobacco Use   Smoking status: Former    Current packs/day: 0.00    Average packs/day: 1.0 packs/day    Types: Cigarettes    Quit date: 12/20/2005    Years since quitting: 18.4   Smokeless tobacco: Never  Vaping Use   Vaping status: Never Used  Substance Use Topics   Alcohol  use: Not Currently    Comment: 09/22/2014 stopped in 2007; drank occasionally when I did drink   Drug use: No  [2] No Active Allergies  "

## 2024-05-27 NOTE — TOC Initial Note (Signed)
 Transition of Care [facility]) - Initial/Assessment Note    Patient Details  Name: Gregory Kerr MRN: [id number] Date of Birth: [birth year]  [facility] CM/SW Contact:    Sherline Clack, LCSWA Phone Number: 05/27/2024, 4:34 PM  Clinical Narrative:                  Patient came from [facility]. CSW reached out to Darrian, admissions person at [facility], and patient able to return to the facility when medically stable. CSW talked to patient about returning to [facility] and patient agreeable. CSW will continue to follow and update DC plan.    Expected Discharge Plan: Skilled Nursing Facility Barriers to Discharge: Continued Medical Work up   Patient Goals and CMS Choice            Expected Discharge Plan and Services       Living arrangements for the past 2 months: Skilled Nursing Facility                                      Prior Living Arrangements/Services Living arrangements for the past 2 months: Skilled Nursing Facility Lives with:: Facility Resident Patient language and need for interpreter reviewed:: Yes                 Activities of Daily Living   ADL Screening (condition at time of admission) Independently performs ADLs?: No Does the patient have a NEW difficulty with bathing/dressing/toileting/self-feeding that is expected to last >3 days?: No Does the patient have a NEW difficulty with getting in/out of bed, walking, or climbing stairs that is expected to last >3 days?: No Does the patient have a NEW difficulty with communication that is expected to last >3 days?: No Is the patient deaf or have difficulty hearing?: No Does the patient have difficulty seeing, even when wearing glasses/contacts?: No Does the patient have difficulty concentrating, remembering, or making decisions?: Yes  Permission Sought/Granted                  Emotional Assessment Appearance:: Appears stated age Attitude/Demeanor/Rapport:  Unable to Assess Affect (typically observed): Unable to Assess Orientation: : Oriented to Self, Oriented to Place, Oriented to  Time, Oriented to Situation      Admission diagnosis:  Gastric volvulus [K31.89] Pyelonephritis [N12] Thoracic aortic aneurysm without rupture, unspecified part [I71.20] Sepsis secondary to UTI (HCC) [A41.9, N39.0] Patient Active Problem List   Diagnosis Date Noted   Sepsis secondary to UTI (HCC) 05/23/2024   Urolithiasis 05/23/2024   AKI (acute kidney injury) 05/23/2024   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   BPH (benign prostatic hyperplasia) 05/23/2024   AAA (abdominal aortic aneurysm) 05/23/2024   Closed compression fracture of L3 vertebra (HCC) 05/23/2024   SIRS (systemic inflammatory response syndrome) (HCC) 02/11/2022   Bacteremia due to Proteus species    Acute renal failure superimposed on stage 3a chronic kidney disease (HCC) 06/29/2021   Bladder spasm 06/29/2021   Sepsis (HCC) 06/28/2021   Spasticity 06/28/2021   Prolonged QT interval 06/28/2021   Constipation 06/28/2021   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 03/08/2021   Generalized weakness    Acute CVA (cerebrovascular accident) (HCC) 11/19/2020   Allergic rhinitis due to allergen 04/27/2016   Chest pain 02/27/2016   Thoracic ascending aortic aneurysm 02/27/2016   Leukocytosis 02/27/2016   Major depression, chronic 02/16/2016   Neuropathic pain 02/16/2016  Diarrhea 12/29/2015   Coronary artery disease involving native coronary artery of native heart with angina pectoris 11/12/2015   History of CVA with residual deficit 10/14/2015   Hyperlipidemia 07/16/2015   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) 12/10/2014   Angina pectoris 12/10/2014   Chronic obstructive pulmonary disease (HCC) 12/10/2014   Irritable bowel syndrome with diarrhea 12/10/2014   Senile osteoporosis 12/10/2014   CVA (cerebral vascular accident) (HCC) 12/10/2014   Acute on chronic diastolic CHF  (congestive heart failure) (HCC) 09/27/2014   Hypokalemia 09/23/2014   Pancreatitis, gallstone 09/22/2014   Chronic diastolic heart failure (HCC) 09/22/2014   Acute cholangitis (HCC) 09/22/2014   Gallstone pancreatitis 09/22/2014   Hiatal hernia with GERD 08/31/2014   Vitamin D  deficiency 08/03/2014   Hyperlipidemia LDL goal <100 08/03/2014   CVA, old, hemiparesis (HCC) 08/03/2014   Leg edema, left 08/03/2014   Gastroesophageal reflux disease without esophagitis 06/18/2014   Essential hypertension, benign 06/18/2014   Primary generalized hypertrophic osteoarthrosis 06/18/2014   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   UTI (urinary tract infection) 04/04/2014   Essential hypertension 04/03/2014   Other specified chronic obstructive pulmonary disease (HCC) 01/15/2014   Dermatophytosis of nail 12/18/2013   Essential hypertension 12/18/2013   Generalized osteoarthrosis, unspecified site 12/18/2013   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   GERD (gastroesophageal reflux disease) 08/11/2013   Disuse osteoporosis 08/11/2013   Chronic pain syndrome 01/30/2013   IBS (irritable bowel syndrome) 01/30/2013   Cerebral infarct (HCC) 10/24/2012   HTN (hypertension) 10/24/2012   Osteoporosis 10/24/2012   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   Other and unspecified hyperlipidemia 10/24/2012   Hiatal hernia 10/24/2012   PCP:  Donnella Agreste Pharmacy:  No Pharmacies Listed    Social Drivers of Health (SDOH) Social History: SDOH Screenings   Food Insecurity: No Food Insecurity (05/23/2024)  Housing: Low Risk (05/25/2024)  Transportation Needs: No Transportation Needs (05/24/2024)  Utilities: Not At Risk (05/24/2024)  Social Connections: Socially Isolated (05/27/2024)  Tobacco Use: Medium Risk (05/23/2024)   SDOH Interventions:     Readmission Risk Interventions    02/15/2022   12:13 PM 10/13/2021   12:30 PM  Readmission Risk Prevention Plan  Transportation Screening  Complete Complete  Medication Review [employer]) Complete Referral to Pharmacy  PCP or Specialist appointment within 3-5 days of discharge Complete Not Complete  PCP/Specialist Appt Not Complete comments  Still inpatient without discharge date at this time  HRI or Home Care Consult Complete Not Complete  HRI or Home Care Consult Pt Refusal Comments  Patient is LTC at SNF  SW Recovery Care/Counseling Consult Complete Complete  Palliative Care Screening Complete Not Applicable  Skilled Nursing Facility Complete Complete

## 2024-05-27 NOTE — Progress Notes (Addendum)
 " PROGRESS NOTE    Gregory Kerr  FMW:[id number] DOB: Sep 08, 1940 DOA: 05/22/2024 PCP: Donnella Agreste   Brief Narrative:   84 y.o. male with medical history significant of hypertension, hyperlipidemia, diastolic congestive heart failure, CVA with residual deficits, MDR UTIs, BPH presented with left sided flank pain.   Urology consulted. s/p bilateral JJ stents 05/23/24 by Dr Elisabeth.   Being treated for sepsis, likely UTI, on IV antibiotics  F/u urine (Proteus and Enterococcus) and blood cultures (Pseudomonas)  Dispo is back to SNF once medically ready.  Assessment & Plan:  Principal Problem:   Sepsis secondary to UTI [facility]) Active Problems:   Urolithiasis   AKI (acute kidney injury)   Hiatal hernia   Gastric volvulus   History of CVA with residual deficit   BPH (benign prostatic hyperplasia)   AAA (abdominal aortic aneurysm)   Closed compression fracture of L3 vertebra (HCC)   Sepsis secondary to acute complicated urinary tract infection, Improving Urolithiasis Acute UTI secondary to Proteus and Enterococcus CT noted bilateral renal pelvis calculi with left ureteral calculus without significant hydronephrosis and a large bladder stone.  Blood and urine cultures were obtained.  Initial lactic acid noted to be elevated at 4.5.  Urology evaluated the patient and he was taken to the operating room for cystoscopy with bilateral ureteral stent placement and left retrograde pyelography on 05/23/24. - Changed IV Ceftriaxone  to Zosyn  on 1/5 after talking to the pharmacist. - Trend lactic acid levels -WBC count is within normal limits now. -Discussed with Dr Constance, [place] for further recommendations on 1/6.   Pseudomonas bacteremia,POA: Switched to IV Zosyn  on 1/5.  Acute kidney injury, resolved now Creatinine elevated up to 1.12 with BUN 26 baseline creatinine previously noted to be around 0.7.  Patient had been bolused IV fluids. - Avoid nephrotoxic agents - Dced fluids    Gastric  volvulus Hiatal hernia Appears chronic.  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  General surgery had been consulted but felt no acute intervention needed at this time. - Monitor intake and output - Continue Protonix  - Appreciate general surgery's input.   History of CVA with residual deficit/left sided hemiparesis Patient with residual deficits from prior CVA and is nonambulatory at baseline. - Consider resuming Plavix  when deemed medically appropriate - Continue baclofen , gabapentin , and Zoloft    BPH With obstruction noted on CT imaging. 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 - Recommend annual screening   Compression fracture Prior to arrival.  Patient was found to have a compression fracture of L3 with 50% height loss.  Disposition: SNF ([facility])   DVT prophylaxis: enoxaparin  (LOVENOX ) injection 40 mg Start: 05/23/24 1536     Code Status: Do not attempt resuscitation (DNR) PRE-ARREST INTERVENTIONS DESIRED Family Communication:  None at the bedside Status is: Inpatient Remains inpatient appropriate because: sepsis, UTI    Subjective:  No acute events overnight. Denied any active complaints. We spoke about his WBC count which is back to normal now.  Examination:  General exam: Appears calm and comfortable  Respiratory system: Clear to auscultation. Respiratory effort normal. Cardiovascular system: S1 & S2 heard, RRR. No JVD, murmurs, rubs, gallops or clicks. No pedal edema. Gastrointestinal system: Abdomen is nondistended, soft and nontender. No organomegaly or masses felt. Normal bowel sounds heard. Central nervous system: Alert and oriented. No focal  neurological deficits. Extremities: left sided hemiparesis, left arm is flexed and adducted      Diet Orders (From admission,  onward)     Start     Ordered   05/23/24 0959  Diet Heart Room service appropriate? Yes; Fluid consistency: Thin  Diet effective now       Question Answer Comment  Room service appropriate? Yes   Fluid consistency: Thin      05/23/24 0958            Objective: Vitals:   05/26/24 2107 05/27/24 0046 05/27/24 0408 05/27/24 0843  BP:  122/62 (!) 143/55 (!) 152/65  Pulse:  (!) 57 (!) 59 70  Resp:      Temp: 98 F (36.7 C) (!) 97 F (36.1 C) 97.6 F (36.4 C) 98.7 F (37.1 C)  TempSrc: Oral   Axillary  SpO2:  95% 97% 95%  Weight:      Height:        Intake/Output Summary (Last 24 hours) at 05/27/2024 1040 Last data filed at 05/27/2024 0905 Gross per 24 hour  Intake 840 ml  Output 2525 ml  Net -1685 ml   Filed Weights   05/22/24 2028  Weight: 78.4 kg    Scheduled Meds:  atorvastatin   40 mg Oral QPM   baclofen   20 mg Oral TID   Chlorhexidine  Gluconate Cloth  6 each Topical Daily   Dextromethorphan -quiNIDine  1 capsule Oral QHS   enoxaparin  (LOVENOX ) injection  40 mg Subcutaneous Q24H   gabapentin   200 mg Oral TID   pantoprazole   20 mg Oral Daily   saccharomyces boulardii  250 mg Oral Q12H   sertraline   100 mg Oral Daily   sertraline   25 mg Oral Daily   tamsulosin   0.4 mg Oral QHS   Continuous Infusions:  lactated ringers      And   lactated ringers      piperacillin -tazobactam (ZOSYN )  IV 3.375 g (05/27/24 0547)    Nutritional status     Body mass index is 27.9 kg/m.  Data Reviewed:   CBC: Recent Labs  Lab 05/22/24 2029 05/24/24 0526 05/25/24 0606 05/26/24 0426 05/27/24 0523  WBC 16.2* 23.4* 12.1* 11.1* 8.6  NEUTROABS 15.6*  --  10.0* 8.7* 6.2  HGB 15.8 11.8* 9.8* 11.1* 12.7*  HCT 48.1 36.0* 30.5* 33.9* 37.5*  MCV 90.9 91.8 93.8 91.4 88.9  PLT 195 102* 88* 108* 123*   Basic Metabolic Panel: Recent Labs  Lab 05/22/24 2029 05/24/24 0526 05/25/24 0606 05/27/24 0523  NA 139 140 141 139  K 4.5 4.1 3.3* 4.3  CL 105 107 115* 106  CO2 18* 22  19* 25  GLUCOSE 150* 119* 89 102*  BUN 26* 40* 25* 20  CREATININE 1.12 0.94 0.54* 0.62  CALCIUM  9.6 8.5* 7.1* 9.0   GFR: Estimated Creatinine Clearance: 71.3 mL/min (by C-G formula based on SCr of 0.62 mg/dL). Liver Function Tests: Recent Labs  Lab 05/22/24 2029  AST 29  ALT 28  ALKPHOS 118  BILITOT 0.6  PROT 8.0  ALBUMIN 4.0   Recent Labs  Lab 05/22/24 2029  LIPASE <10*   No results for input(s): AMMONIA in the last 168 hours. Coagulation Profile: No results for input(s): INR, PROTIME in the last 168 hours. Cardiac Enzymes: No results for input(s): CKTOTAL, CKMB, CKMBINDEX, TROPONINI in the last 168 hours. BNP (last 3 results) No results for input(s): PROBNP in the last 8760 hours. HbA1C: No results for input(s): HGBA1C in the last 72 hours.  CBG: Recent Labs  Lab 05/23/24 0207  GLUCAP 140*   Lipid Profile: No results for input(s): CHOL, HDL, LDLCALC, TRIG, CHOLHDL, LDLDIRECT in the last 72 hours. Thyroid  Function Tests: No results for input(s): TSH, T4TOTAL, FREET4, T3FREE, THYROIDAB in the last 72 hours. Anemia Panel: No results for input(s): VITAMINB12, FOLATE, FERRITIN, TIBC, IRON, RETICCTPCT in the last 72 hours. Sepsis Labs: Recent Labs  Lab 05/23/24 0405  LATICACIDVEN 4.5*    Recent Results (from the past 240 hours)  Blood culture (routine x 2)     Status: Abnormal (Preliminary result)   Collection Time: 05/23/24  3:02 AM   Specimen: BLOOD  Result Value Ref Range Status   Specimen Description BLOOD SITE NOT SPECIFIED  Final   Special Requests   Final    BOTTLES DRAWN AEROBIC AND ANAEROBIC Blood Culture results may not be optimal due to an inadequate volume of blood received in culture bottles   Culture  Setup Time   Final    GRAM NEGATIVE RODS AEROBIC BOTTLE ONLY CRITICAL RESULT CALLED TO, READ BACK BY AND VERIFIED WITH: PHARMD WANNARAT [id number] AT 1007 AM BY CM    Culture (A)  Final     PSEUDOMONAS AERUGINOSA SUSCEPTIBILITIES TO FOLLOW Performed at [facility], [address] [place], [place] [address]    Report Status PENDING  Incomplete  Blood Culture ID Panel (Reflexed)     Status: Abnormal   Collection Time: 05/23/24  3:02 AM  Result Value Ref Range Status   Enterococcus faecalis NOT DETECTED NOT DETECTED Final   Enterococcus Faecium NOT DETECTED NOT DETECTED Final   Listeria monocytogenes NOT DETECTED NOT DETECTED Final   Staphylococcus species NOT DETECTED NOT DETECTED Final   Staphylococcus aureus (BCID) NOT DETECTED NOT DETECTED Final   Staphylococcus epidermidis NOT DETECTED NOT DETECTED Final   Staphylococcus lugdunensis NOT DETECTED NOT DETECTED Final   Streptococcus species NOT DETECTED NOT DETECTED Final   Streptococcus agalactiae NOT DETECTED NOT DETECTED Final   Streptococcus pneumoniae NOT DETECTED NOT DETECTED Final   Streptococcus pyogenes NOT DETECTED NOT DETECTED Final   A.calcoaceticus-baumannii NOT DETECTED NOT DETECTED Final   Bacteroides fragilis NOT DETECTED NOT DETECTED Final   Enterobacterales NOT DETECTED NOT DETECTED Final   Enterobacter cloacae complex NOT DETECTED NOT DETECTED Final   Escherichia coli NOT DETECTED NOT DETECTED Final   Klebsiella aerogenes NOT DETECTED NOT DETECTED Final   Klebsiella oxytoca NOT DETECTED NOT DETECTED Final   Klebsiella pneumoniae NOT DETECTED NOT DETECTED Final   Proteus species NOT DETECTED NOT DETECTED Final   Salmonella species NOT DETECTED NOT DETECTED Final   Serratia marcescens NOT DETECTED NOT DETECTED Final   Haemophilus influenzae NOT DETECTED NOT DETECTED Final   Neisseria meningitidis NOT DETECTED NOT DETECTED Final   Pseudomonas aeruginosa DETECTED (A) NOT DETECTED Final    Comment: CRITICAL RESULT CALLED TO, READ BACK BY AND VERIFIED WITH: PHARMD EANNARAT [id number] AT 1007 AM  BY CM    Stenotrophomonas maltophilia NOT DETECTED NOT DETECTED Final   Candida albicans NOT DETECTED NOT  DETECTED Final   Candida auris NOT DETECTED NOT DETECTED Final   Candida glabrata NOT DETECTED NOT DETECTED Final   Candida krusei NOT DETECTED NOT DETECTED Final   Candida parapsilosis NOT DETECTED NOT DETECTED Final   Candida tropicalis NOT DETECTED NOT DETECTED Final   Cryptococcus neoformans/gattii NOT DETECTED NOT DETECTED Final   CTX-M ESBL NOT DETECTED NOT DETECTED Final   Carbapenem resistance IMP NOT DETECTED NOT DETECTED Final  Carbapenem resistance KPC NOT DETECTED NOT DETECTED Final   Carbapenem resistance NDM NOT DETECTED NOT DETECTED Final   Carbapenem resistance VIM NOT DETECTED NOT DETECTED Final    Comment: Performed at [facility], [address], [place], [place] [address]  Urine Culture     Status: Abnormal   Collection Time: 05/23/24  4:28 AM   Specimen: Urine, Catheterized  Result Value Ref Range Status   Specimen Description URINE, CATHETERIZED  Final   Special Requests   Final    NONE Performed at [facility], [address], [place], [place] [address]    Culture (A)  Final    >=100,000 COLONIES/mL PROTEUS MIRABILIS >=100,000 COLONIES/mL ENTEROCOCCUS FAECALIS    Report Status 05/26/2024 FINAL  Final   Organism ID, Bacteria PROTEUS MIRABILIS (A)  Final   Organism ID, Bacteria ENTEROCOCCUS FAECALIS (A)  Final      Susceptibility   Enterococcus faecalis - MIC*    AMPICILLIN  <=2 SENSITIVE Sensitive     NITROFURANTOIN  <=16 SENSITIVE Sensitive     VANCOMYCIN  1 SENSITIVE Sensitive     * >=100,000 COLONIES/mL ENTEROCOCCUS FAECALIS   Proteus mirabilis - MIC*    AMPICILLIN  <=2 SENSITIVE Sensitive     CEFAZOLIN  (URINE) Value in next row Sensitive      2 SENSITIVEThis is a modified FDA-approved test that has been validated and its performance characteristics determined by the reporting laboratory.  This laboratory is certified under the Clinical Laboratory Improvement Amendments CLIA as qualified to perform high complexity clinical laboratory testing.     CEFEPIME  Value in next row Sensitive      2 SENSITIVEThis is a modified FDA-approved test that has been validated and its performance characteristics determined by the reporting laboratory.  This laboratory is certified under the Clinical Laboratory Improvement Amendments CLIA as qualified to perform high complexity clinical laboratory testing.    ERTAPENEM Value in next row Sensitive      2 SENSITIVEThis is a modified FDA-approved test that has been validated and its performance characteristics determined by the reporting laboratory.  This laboratory is certified under the Clinical Laboratory Improvement Amendments CLIA as qualified to perform high complexity clinical laboratory testing.    CEFTRIAXONE  Value in next row Sensitive      2 SENSITIVEThis is a modified FDA-approved test that has been validated and its performance characteristics determined by the reporting laboratory.  This laboratory is certified under the Clinical Laboratory Improvement Amendments CLIA as qualified to perform high complexity clinical laboratory testing.    CIPROFLOXACIN  Value in next row Resistant      2 SENSITIVEThis is a modified FDA-approved test that has been validated and its performance characteristics determined by the reporting laboratory.  This laboratory is certified under the Clinical Laboratory Improvement Amendments CLIA as qualified to perform high complexity clinical laboratory testing.    GENTAMICIN Value in next row Sensitive      2 SENSITIVEThis is a modified FDA-approved test that has been validated and its performance characteristics determined by the reporting laboratory.  This laboratory is certified under the Clinical Laboratory Improvement Amendments CLIA as qualified to perform high complexity clinical laboratory testing.    NITROFURANTOIN  Value in next row Resistant      2 SENSITIVEThis is a modified FDA-approved test that has been validated and its performance characteristics determined by the  reporting laboratory.  This laboratory is certified under the Clinical Laboratory Improvement Amendments CLIA as qualified to perform high complexity clinical laboratory testing.  TRIMETH /SULFA  Value in next row Sensitive      2 SENSITIVEThis is a modified FDA-approved test that has been validated and its performance characteristics determined by the reporting laboratory.  This laboratory is certified under the Clinical Laboratory Improvement Amendments CLIA as qualified to perform high complexity clinical laboratory testing.    AMPICILLIN /SULBACTAM Value in next row Sensitive      2 SENSITIVEThis is a modified FDA-approved test that has been validated and its performance characteristics determined by the reporting laboratory.  This laboratory is certified under the Clinical Laboratory Improvement Amendments CLIA as qualified to perform high complexity clinical laboratory testing.    PIP/TAZO Value in next row Sensitive      <=4 SENSITIVEThis is a modified FDA-approved test that has been validated and its performance characteristics determined by the reporting laboratory.  This laboratory is certified under the Clinical Laboratory Improvement Amendments CLIA as qualified to perform high complexity clinical laboratory testing.    MEROPENEM Value in next row Sensitive      <=4 SENSITIVEThis is a modified FDA-approved test that has been validated and its performance characteristics determined by the reporting laboratory.  This laboratory is certified under the Clinical Laboratory Improvement Amendments CLIA as qualified to perform high complexity clinical laboratory testing.    * >=100,000 COLONIES/mL PROTEUS MIRABILIS  Blood culture (routine x 2)     Status: None (Preliminary result)   Collection Time: 05/23/24  4:19 PM   Specimen: BLOOD  Result Value Ref Range Status   Specimen Description BLOOD SITE NOT SPECIFIED  Final   Special Requests   Final    BOTTLES DRAWN AEROBIC AND ANAEROBIC Blood Culture  results may not be optimal due to an inadequate volume of blood received in culture bottles   Culture   Final    NO GROWTH 4 DAYS Performed at [facility], [address] [place], [place] [address]    Report Status PENDING  Incomplete         Radiology Studies: No results found.          LOS: 4 days   Time spent= 35 mins    Deliliah Room, MD [facility]  If 7PM-7AM, please contact night-coverage  05/27/2024, 10:40 AM  "

## 2024-05-28 LAB — CULTURE, BLOOD (ROUTINE X 2)
Culture  Setup Time: NO GROWTH
Culture: NO GROWTH

## 2024-05-28 MED ORDER — CIPROFLOXACIN HCL 750 MG PO TABS
750.0000 mg | ORAL_TABLET | Freq: Two times a day (BID) | ORAL | Status: DC
  Administered 2024-05-28: 750 mg via ORAL
  Filled 2024-05-28 (×2): qty 1

## 2024-05-28 MED ORDER — ATORVASTATIN CALCIUM 40 MG PO TABS: 40.0000 mg | ORAL_TABLET | Freq: Every evening | ORAL | Status: AC

## 2024-05-28 MED ORDER — CIPROFLOXACIN HCL 750 MG PO TABS: 750.0000 mg | ORAL_TABLET | Freq: Two times a day (BID) | ORAL | Status: DC

## 2024-05-28 NOTE — Progress Notes (Signed)
 Patient will be returning to [place], cousin Vina aware.

## 2024-05-28 NOTE — Plan of Care (Signed)
 Id brief note   PsA not sensitive to piptazo Patient nonseptic  -will finish 7 days cipro , starting today -id will sign off -discussed with [facility]

## 2024-05-28 NOTE — Discharge Summary (Signed)
 " Physician Discharge Summary   Patient: Gregory Kerr MRN: [id number] DOB: May 21, 1941  Admit date:     05/22/2024  Discharge date: 05/28/2024  Discharge Physician: Deliliah Room   PCP: Donnella Agreste   Recommendations at discharge:    F/u with your PCP in one week. 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 male with medical history significant of hypertension, hyperlipidemia, diastolic congestive heart failure, CVA with residual deficits, MDR UTIs, BPH presented with left sided flank pain.    Urology consulted. s/p bilateral JJ stents 05/23/24 by Dr Elisabeth.   Sepsis secondary to acute complicated urinary tract infection, resolved Urolithiasis Acute UTI secondary to Proteus and Enterococcus CT noted bilateral renal pelvis calculi with left ureteral calculus without significant hydronephrosis and a large bladder stone.  Blood and urine cultures were obtained.  Initial lactic acid noted to be elevated at 4.5.  Urology evaluated the patient and he was taken to the operating room for cystoscopy with bilateral ureteral stent placement and left retrograde pyelography on 05/23/24. - Changed IV Ceftriaxone  to Zosyn  on 1/5 after talking to the pharmacist. Changed to oral cipro  on discharge after discussing with Dr Overton. Needs to be on oral cipro  750 mg PID x 7 days (until 1/13) as per ID Dr Overton.  -WBC count is within normal limits now. -Outpatient f/u with [facility] for definite stone management.      Pseudomonas bacteremia,POA: Switched to IV  Zosyn  on 1/5. Changed to oral ciprofloxacin  on 1/7. Needs to be on oral cipro  750 mg PID x 7 days (until 1/13) as per ID Dr Overton.   Acute kidney injury, resolved now Creatinine elevated up to 1.12 with BUN 26 baseline creatinine previously noted to be around 0.7.  Patient received IV fluids. - Avoid nephrotoxic agents - Dced fluids     Gastric volvulus Hiatal hernia Appears chronic.  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  General surgery had been consulted but felt no acute intervention needed at this time. - Continue Protonix  - Appreciate general surgery's input.   History of CVA with residual deficit/left sided hemiparesis Patient with residual deficits from prior CVA and is nonambulatory at baseline. - Consider resuming Plavix  when deemed medically appropriate - Continue baclofen , gabapentin , and Zoloft    BPH With obstruction noted on CT imaging. 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 - Recommend annual screening   Compression fracture Prior to arrival.  Patient was found to have a compression fracture of L3 with 50% height loss.   Disposition: SNF ([facility])       Consultants: ID, Urology Procedures performed: bilateral JJ stents 05/23/24 by Dr Elisabeth   Disposition: Skilled nursing facility Diet recommendation:  Cardiac diet DISCHARGE MEDICATION: Allergies as of 05/28/2024   No Active Allergies      Medication List     TAKE these medications  acetaminophen  650 MG CR tablet Commonly known as: TYLENOL  Take 650 mg by mouth in the morning and at bedtime.   albuterol  108 (90 Base) MCG/ACT inhaler Commonly known as: VENTOLIN  HFA Inhale 1-2 puffs into the lungs every 6 (six) hours as needed for wheezing or shortness of breath.   atorvastatin  40 MG tablet Commonly known as: LIPITOR Take 1  tablet (40 mg total) by mouth every evening.   baclofen  20 MG tablet Commonly known as: LIORESAL  Take 20 mg by mouth 3 (three) times daily.   bisacodyl  10 MG suppository Commonly known as: DULCOLAX Place 10 mg rectally daily as needed for moderate constipation.   calcium -vitamin D  500-5 MG-MCG tablet Commonly known as: OSCAL WITH D Take 1 tablet by mouth 2 (two) times daily.   ciprofloxacin  750 MG tablet Commonly known as: CIPRO  Take 1 tablet (750 mg total) by mouth 2 (two) times daily for 7 days.   clopidogrel  75 MG tablet Commonly known as: PLAVIX  Take 1 tablet (75 mg total) by mouth daily.   clotrimazole-betamethasone cream Commonly known as: LOTRISONE Apply 1 Application topically daily.   Cranberry 450 MG Tabs Take 450 mg by mouth daily.   Dextromethorphan -quiNIDine 20-10 MG capsule Commonly known as: NUEDEXTA  Take 1 capsule by mouth at bedtime. For Pseudobulbar Affect (PBA)   gabapentin  100 MG capsule Commonly known as: NEURONTIN  Take 200 mg by mouth 3 (three) times daily.   gentamicin ointment 0.1 % Commonly known as: GARAMYCIN Apply 1 Application topically daily.   HYDROcodone -acetaminophen  5-325 MG tablet Commonly known as: NORCO/VICODIN Take 1 tablet by mouth in the morning, at noon, and at bedtime.   loperamide  2 MG tablet Commonly known as: IMODIUM  A-D Take 4 mg by mouth 4 (four) times daily as needed for diarrhea or loose stools.   loratadine  10 MG tablet Commonly known as: CLARITIN  Take 10 mg by mouth at bedtime.   nitroGLYCERIN  0.4 MG SL tablet Commonly known as: NITROSTAT  Place 0.4 mg under the tongue every 5 (five) minutes as needed for chest pain.   pantoprazole  20 MG tablet Commonly known as: PROTONIX  Take 20 mg by mouth daily.   phenazopyridine  100 MG tablet Commonly known as: PYRIDIUM  Take 100 mg by mouth every 8 (eight) hours as needed (bladder spasms).   polyethylene glycol 17 g packet Commonly known as: MIRALAX  / GLYCOLAX  Take  17 g by mouth daily as needed (constipation).   PreviDent 5000 Booster Plus 1.1 % Pste Generic drug: Sodium Fluoride Take 1 Application by mouth at bedtime.   Pro-Stat AWC Liqd Take 30 mLs by mouth 2 (two) times daily with a meal.   saccharomyces boulardii 250 MG capsule Commonly known as: FLORASTOR Take 250 mg by mouth every 12 (twelve) hours.   sertraline  100 MG tablet Commonly known as: ZOLOFT  Take 100 mg by mouth daily. Take with 25mg  Sertraline  tablet for a combined dose 125mg .   sertraline  25 MG tablet Commonly known as: ZOLOFT  Take 25 mg by mouth daily. Take with 100mg  Sertraline  tablet for a combined dose 125mg .   tamsulosin  0.4 MG Caps capsule Commonly known as: FLOMAX  Take 1 capsule (0.4 mg total) by mouth at bedtime.   trimethoprim  100 MG tablet Commonly known as: TRIMPEX  Take 100 mg by mouth at bedtime. **No stop date** per [facility]   WesTab Plus 27-1 MG Tabs Take 1 tablet by mouth at bedtime.        Follow-up Information     [name]. Schedule an appointment as soon as possible  for a visit in 1 week(s).   Specialty: Skilled Nursing Facility Contact information: [address] [place] [address] [phone]         [facility]. Schedule an appointment as soon as possible for a visit in 2 week(s).   Contact information: [address] [place] [address] [place]  [address] [phone]               Discharge Exam: Fredricka Weights   05/22/24 2028  Weight: 78.4 kg   General exam: Appears calm and comfortable  Respiratory system: Clear to auscultation. Respiratory effort normal. Cardiovascular system: S1 & S2 heard, RRR. No JVD, murmurs, rubs, gallops or clicks. No pedal edema. Gastrointestinal system: Abdomen is nondistended, soft and nontender. No organomegaly or masses felt. Normal bowel sounds heard. Central nervous system: Alert and oriented. No focal neurological deficits. Extremities: left sided hemiparesis,  left arm is flexed and adducted   Condition at discharge: good  The results of significant diagnostics from this hospitalization (including imaging, microbiology, ancillary and laboratory) are listed below for reference.   Imaging Studies: DG C-Arm 1-60 Min-No Report Result Date: 05/23/2024 Fluoroscopy was utilized by the requesting physician.  No radiographic interpretation.   CT Angio Chest PE W and/or Wo Contrast Result Date: 05/22/2024 EXAM: CTA CHEST PE WITHOUT AND WITH CONTRAST CT ABDOMEN AND PELVIS WITHOUT AND WITH CONTRAST 05/22/2024 10:47:02 PM TECHNIQUE: CTA of the chest was performed after the administration of 75 mL of intravenous contrast (iohexol  (OMNIPAQUE ) 350 MG/ML injection). Multiplanar reformatted images are provided for review. MIP images are provided for review. CT of the abdomen and pelvis was performed with and without the administration of intravenous contrast. Automated exposure control, iterative reconstruction, and/or weight based adjustment of the mA/kV was utilized to reduce the radiation dose to as low as reasonably achievable. COMPARISON: Findings were compared to prior studies from 2022 and 02/11/2022. CLINICAL HISTORY: Pulmonary embolism (PE) high prob. Nephrolithiasis, abdominal and flank pain, hyperbilirubinemia, pulmonary embolism. FINDINGS: CHEST: PULMONARY ARTERIES: Pulmonary arteries are adequately opacified for evaluation. No intraluminal filling defect to suggest pulmonary embolism. Main pulmonary artery is normal in caliber. MEDIASTINUM: Extensive multivessel coronary artery calcification. The heart and pericardium demonstrate no acute abnormality. 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 The descending thoracic aorta is of normal caliber. No mediastinal lymphadenopathy. LUNGS AND PLEURA: Large hiatal hernia containing the entire stomach which  demonstrates organoaxial gastric volvulus, similar to prior examination, without obstruction. Resultant marked elevation of the left hemidiaphragm, left basilar atelectasis and left-sided volume loss. Mild right basilar atelectasis. No pleural effusion or pneumothorax. SOFT TISSUES AND BONES: Osseous structures are age appropriate. No acute soft tissue abnormality. ABDOMEN AND PELVIS: LIVER: The liver is unremarkable. GALLBLADDER AND BILE DUCTS: Status post cholecystectomy. No biliary ductal dilatation. SPLEEN: Spleen demonstrates no acute abnormality. PANCREAS: Moderate fatty atrophy of the pancreas. There is increasing herniation of the proximal tail of the pancreas into the thoracic compartment. No evidence of superimposed inflammatory change, however. ADRENAL GLANDS: Adrenal glands demonstrate no acute abnormality. KIDNEYS, URETERS AND BLADDER: Mild asymmetric right renal cortical atrophy. Simple cortical cyst arises from the lower pole of the right kidney. No follow up imaging is recommended. Bilateral nonobstructing renal calculi are noted within the lower pole of the right kidney and within the renal pelvis bilaterally measuring up to 14 mm in the left renal pelvis and 16 mm within the right  renal pelvis. No hydronephrosis. No ureteral calculi on the right. Nonobstructing 4 mm calculus within the mid left ureter at the level of the crossing iliac vasculature. 27 mm calculus noted dependently within the bladder lumen. 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 The bladder is not distended. 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 No perinephric or periureteral stranding. GI AND BOWEL: Large right inguinal hernia contains the terminal ileum which appears unremarkable. Moderate fat-containing  left inguinal hernia. No evidence of obstruction or focal inflammation. Status post appendectomy. The duodenal sweep demonstrates no acute abnormality. There is no bowel obstruction. No abnormal bowel wall thickening or distension. 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 PERITONEUM AND RETROPERITONEUM: No ascites or free air. LYMPH NODES: No lymphadenopathy. BONES AND SOFT TISSUES: Subacute to remote-appearing anterior wedge compression deformity of L3 with 50% loss of height, new from prior examination. Osseous structures are otherwise age appropriate. Moderate aortoiliac atherosclerotic calcification. No focal soft tissue abnormality. LIMITATIONS/ARTIFACTS: Imaging is limited by respiratory motion artifact. IMPRESSION: 1. No pulmonary embolism. 2. 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 3. 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 Recommend annual imaging follow-up by CTA or mra. This recommendation follows 2010 accf/aha/aats/ACR/asa/sca/scai/sir/sts/svm guidelines for the diagnosis and management of patients with thoracic aortic disease. Circulation. 2010; 121: Z733-z630. Aortic aneurysm nos (icd10-i71.9) 4. Bilateral nonobstructing renal calculi, with a 4 mm nonobstructing calculus in the mid left ureter and a 27 mm calculus in the bladder lumen. 5. 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 6. Large right inguinal hernia containing the terminal ileum and moderate fat-containing left inguinal hernia, without obstruction or focal inflammation. 7. Subacute to remote-appearing anterior wedge compression deformity of L3 with 50% loss of height, new from  prior examination. Correlation with clinical exam is recommended. If indicated, MRI examination will be more helpful to better age the fracture. 8. Extensive multivessel coronary artery calcification. 9. Status post cholecystectomy and appendectomy. Electronically signed by: Dorethia Molt MD 05/22/2024 11:39 PM EST RP Workstation: [id number]   CT ABDOMEN PELVIS W CONTRAST Result Date: 05/22/2024 EXAM: CTA CHEST PE WITHOUT AND WITH CONTRAST CT ABDOMEN AND PELVIS WITHOUT AND WITH CONTRAST 05/22/2024 10:47:02 PM TECHNIQUE: CTA of the chest was performed after the administration of 75 mL of intravenous contrast (iohexol  (OMNIPAQUE ) 350 MG/ML injection). Multiplanar reformatted images are provided for review. MIP images are provided for review. CT of the abdomen and pelvis was performed with and without the administration of intravenous contrast. Automated exposure control, iterative reconstruction, and/or weight based adjustment of the mA/kV was utilized to reduce the radiation dose to as low as reasonably achievable. COMPARISON: Findings were compared to prior studies from 2022 and 02/11/2022. CLINICAL HISTORY: Pulmonary embolism (PE) high prob. Nephrolithiasis, abdominal and flank pain, hyperbilirubinemia, pulmonary embolism. FINDINGS: CHEST: PULMONARY ARTERIES: Pulmonary arteries are adequately opacified for evaluation. No intraluminal filling defect to suggest pulmonary embolism. Main pulmonary artery is normal in caliber. MEDIASTINUM: Extensive multivessel coronary artery calcification. The heart and pericardium demonstrate no acute abnormality. 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 The descending thoracic aorta is of normal caliber. No mediastinal lymphadenopathy. LUNGS AND PLEURA: Large hiatal hernia containing the entire stomach which demonstrates organoaxial gastric volvulus, similar to prior  examination, without obstruction. Resultant marked elevation of the left hemidiaphragm, left basilar atelectasis and left-sided volume loss. Mild right basilar atelectasis. No pleural effusion or pneumothorax. SOFT TISSUES AND BONES: Osseous structures are age appropriate. No acute soft tissue abnormality. ABDOMEN AND PELVIS: LIVER: The liver is unremarkable. GALLBLADDER AND BILE DUCTS: Status post cholecystectomy. No biliary ductal dilatation. SPLEEN: Spleen demonstrates no acute abnormality. PANCREAS: Moderate fatty atrophy of the pancreas. There is increasing herniation of the proximal tail of the pancreas into the thoracic compartment. No evidence of superimposed inflammatory change, however. ADRENAL GLANDS: Adrenal glands demonstrate no acute abnormality. KIDNEYS, URETERS AND BLADDER: Mild asymmetric right renal cortical atrophy. Simple cortical cyst arises from the lower pole of the right kidney. No follow up imaging is recommended. Bilateral nonobstructing renal calculi are noted within the lower pole of the right kidney and within the renal pelvis bilaterally measuring up to 14 mm in the left renal pelvis and 16 mm within the right renal pelvis. No hydronephrosis. No ureteral calculi on the right. Nonobstructing 4 mm calculus within the mid left ureter at the level of the crossing iliac vasculature. 27 mm calculus noted dependently within the bladder lumen. 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 The bladder is not distended. 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 No perinephric or periureteral stranding. GI AND BOWEL: Large right inguinal hernia contains the terminal ileum which appears unremarkable. Moderate fat-containing left inguinal hernia. No evidence of obstruction or focal  inflammation. Status post appendectomy. The duodenal sweep demonstrates no acute abnormality. There is no bowel obstruction. No abnormal bowel wall thickening or distension. 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 PERITONEUM AND RETROPERITONEUM: No ascites or free air. LYMPH NODES: No lymphadenopathy. BONES AND SOFT TISSUES: Subacute to remote-appearing anterior wedge compression deformity of L3 with 50% loss of height, new from prior examination. Osseous structures are otherwise age appropriate. Moderate aortoiliac atherosclerotic calcification. No focal soft tissue abnormality. LIMITATIONS/ARTIFACTS: Imaging is limited by respiratory motion artifact. IMPRESSION: 1. No pulmonary embolism. 2. 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 3. 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 Recommend annual imaging follow-up by CTA or mra. This recommendation follows 2010 accf/aha/aats/ACR/asa/sca/scai/sir/sts/svm guidelines for the diagnosis and management of patients with thoracic aortic disease. Circulation. 2010; 121: [id number]. Aortic aneurysm nos (icd10-i71.9) 4. Bilateral nonobstructing renal calculi, with a 4 mm nonobstructing calculus in the mid left ureter and a 27 mm calculus in the bladder lumen. 5. 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 6. Large right inguinal hernia containing the terminal ileum and moderate fat-containing left inguinal hernia, without obstruction or focal inflammation. 7. Subacute to remote-appearing anterior wedge compression deformity of L3 with 50% loss of height, new from prior examination. Correlation with clinical exam is  recommended. If indicated, MRI examination will be more helpful to better age the fracture. 8. Extensive multivessel coronary artery calcification. 9. Status post cholecystectomy and appendectomy. Electronically signed by: Dorethia Molt  MD 05/22/2024 11:39 PM EST RP Workstation: [id number]   DG Chest Port 1 View Result Date: 05/22/2024 EXAM: 1 VIEW(S) XRAY OF THE CHEST 05/22/2024 08:51:07 PM COMPARISON: None available. CLINICAL HISTORY: Back pain. SOB (shortness of breath). FINDINGS: LUNGS AND PLEURA: Low lung volumes accentuate pulmonary vascularity. Elevated left hemidiaphragm. Left basilar atelectasis. No pleural effusion. No pneumothorax. HEART AND MEDIASTINUM: Low lung volumes accentuate the cardiomediastinal silhouette. Aortic atherosclerotic calcification is present. BONES AND SOFT TISSUES: Gaseous gastric distension. No acute osseous abnormality. IMPRESSION: 1. No acute cardiopulmonary abnormality. 2. Low lung volumes with left basilar atelectasis. 3. Large hiatal hernia with gaseous distention of the intrathoric stomach. Please correlate clinically. Electronically signed by: Greig Pique MD 05/22/2024 08:56 PM EST RP Workstation: [id number]    Microbiology: Results for orders placed or performed during the hospital encounter of 05/22/24  Blood culture (routine x 2)     Status: Abnormal   Collection Time: 05/23/24  3:02 AM   Specimen: BLOOD  Result Value Ref Range Status   Specimen Description BLOOD SITE NOT SPECIFIED  Final   Special Requests   Final    BOTTLES DRAWN AEROBIC AND ANAEROBIC Blood Culture results may not be optimal due to an inadequate volume of blood received in culture bottles   Culture  Setup Time   Final    GRAM NEGATIVE RODS AEROBIC BOTTLE ONLY CRITICAL RESULT CALLED TO, READ BACK BY AND VERIFIED WITH: PHARMD WANNARAT [id number] AT 1007 AM BY CM Performed at [facility], [address] [place], [place] [address]    Culture PSEUDOMONAS AERUGINOSA (A)  Final    Report Status 05/28/2024 FINAL  Final   Organism ID, Bacteria PSEUDOMONAS AERUGINOSA  Final      Susceptibility   Pseudomonas aeruginosa - MIC*    MEROPENEM 2 SENSITIVE Sensitive     CIPROFLOXACIN  0.5 SENSITIVE Sensitive     IMIPENEM 1 SENSITIVE Sensitive     CEFTAZIDIME/AVIBACTAM 4 SENSITIVE Sensitive     CEFTOLOZANE/TAZOBACTAM 1 SENSITIVE Sensitive     TOBRAMYCIN  <=1 SENSITIVE Sensitive     * PSEUDOMONAS AERUGINOSA  Blood Culture ID Panel (Reflexed)     Status: Abnormal   Collection Time: 05/23/24  3:02 AM  Result Value Ref Range Status   Enterococcus faecalis NOT DETECTED NOT DETECTED Final   Enterococcus Faecium NOT DETECTED NOT DETECTED Final   Listeria monocytogenes NOT DETECTED NOT DETECTED Final   Staphylococcus species NOT DETECTED NOT DETECTED Final   Staphylococcus aureus (BCID) NOT DETECTED NOT DETECTED Final   Staphylococcus epidermidis NOT DETECTED NOT DETECTED Final   Staphylococcus lugdunensis NOT DETECTED NOT DETECTED Final   Streptococcus species NOT DETECTED NOT DETECTED Final   Streptococcus agalactiae NOT DETECTED NOT DETECTED Final   Streptococcus pneumoniae NOT DETECTED NOT DETECTED Final   Streptococcus pyogenes NOT DETECTED NOT DETECTED Final   A.calcoaceticus-baumannii NOT DETECTED NOT DETECTED Final   Bacteroides fragilis NOT DETECTED NOT DETECTED Final   Enterobacterales NOT DETECTED NOT DETECTED Final   Enterobacter cloacae complex NOT DETECTED NOT DETECTED Final   Escherichia coli NOT DETECTED NOT DETECTED Final   Klebsiella aerogenes NOT DETECTED NOT DETECTED Final   Klebsiella oxytoca NOT DETECTED NOT DETECTED Final   Klebsiella pneumoniae NOT DETECTED NOT DETECTED Final   Proteus species NOT DETECTED NOT DETECTED Final   Salmonella species NOT DETECTED NOT DETECTED Final   Serratia marcescens NOT DETECTED NOT DETECTED Final   Haemophilus influenzae NOT DETECTED NOT DETECTED Final   Neisseria meningitidis NOT DETECTED NOT DETECTED Final  Pseudomonas aeruginosa DETECTED (A) NOT DETECTED Final    Comment: CRITICAL RESULT CALLED TO, READ BACK BY AND VERIFIED WITH: PHARMD EANNARAT [id number] AT 1007 AM  BY CM    Stenotrophomonas maltophilia NOT DETECTED NOT DETECTED Final   Candida albicans NOT DETECTED NOT DETECTED Final   Candida auris NOT DETECTED NOT DETECTED Final   Candida glabrata NOT DETECTED NOT DETECTED Final   Candida krusei NOT DETECTED NOT DETECTED Final   Candida parapsilosis NOT DETECTED NOT DETECTED Final   Candida tropicalis NOT DETECTED NOT DETECTED Final   Cryptococcus neoformans/gattii NOT DETECTED NOT DETECTED Final   CTX-M ESBL NOT DETECTED NOT DETECTED Final   Carbapenem resistance IMP NOT DETECTED NOT DETECTED Final   Carbapenem resistance KPC NOT DETECTED NOT DETECTED Final   Carbapenem resistance NDM NOT DETECTED NOT DETECTED Final   Carbapenem resistance VIM NOT DETECTED NOT DETECTED Final    Comment: Performed at [facility], [address], [place], [place] [address]  Urine Culture     Status: Abnormal   Collection Time: 05/23/24  4:28 AM   Specimen: Urine, Catheterized  Result Value Ref Range Status   Specimen Description URINE, CATHETERIZED  Final   Special Requests   Final    NONE Performed at [facility], [address], [place], [place] [address]    Culture (A)  Final    >=100,000 COLONIES/mL PROTEUS MIRABILIS >=100,000 COLONIES/mL ENTEROCOCCUS FAECALIS    Report Status 05/26/2024 FINAL  Final   Organism ID, Bacteria PROTEUS MIRABILIS (A)  Final   Organism ID, Bacteria ENTEROCOCCUS FAECALIS (A)  Final      Susceptibility   Enterococcus faecalis - MIC*    AMPICILLIN  <=2 SENSITIVE Sensitive     NITROFURANTOIN  <=16 SENSITIVE Sensitive     VANCOMYCIN  1 SENSITIVE Sensitive     * >=100,000 COLONIES/mL ENTEROCOCCUS FAECALIS   Proteus mirabilis - MIC*    AMPICILLIN  <=2 SENSITIVE Sensitive     CEFAZOLIN  (URINE) Value in next row Sensitive      2 SENSITIVEThis is a modified  FDA-approved test that has been validated and its performance characteristics determined by the reporting laboratory.  This laboratory is certified under the Clinical Laboratory Improvement Amendments CLIA as qualified to perform high complexity clinical laboratory testing.    CEFEPIME  Value in next row Sensitive      2 SENSITIVEThis is a modified FDA-approved test that has been validated and its performance characteristics determined by the reporting laboratory.  This laboratory is certified under the Clinical Laboratory Improvement Amendments CLIA as qualified to perform high complexity clinical laboratory testing.    ERTAPENEM Value in next row Sensitive      2 SENSITIVEThis is a modified FDA-approved test that has been validated and its performance characteristics determined by the reporting laboratory.  This laboratory is certified under the Clinical Laboratory Improvement Amendments CLIA as qualified to perform high complexity clinical laboratory testing.    CEFTRIAXONE  Value in next row Sensitive      2 SENSITIVEThis is a modified FDA-approved test that has been validated and its performance characteristics determined by the reporting laboratory.  This laboratory is certified under the Clinical Laboratory Improvement Amendments CLIA as qualified to perform high complexity clinical laboratory testing.    CIPROFLOXACIN  Value in next row Resistant      2 SENSITIVEThis is a modified FDA-approved test that has been validated and its performance characteristics determined by the reporting laboratory.  This laboratory is certified under the Clinical Laboratory Improvement  Amendments CLIA as qualified to perform high complexity clinical laboratory testing.    GENTAMICIN Value in next row Sensitive      2 SENSITIVEThis is a modified FDA-approved test that has been validated and its performance characteristics determined by the reporting laboratory.  This laboratory is certified under the Clinical Laboratory  Improvement Amendments CLIA as qualified to perform high complexity clinical laboratory testing.    NITROFURANTOIN  Value in next row Resistant      2 SENSITIVEThis is a modified FDA-approved test that has been validated and its performance characteristics determined by the reporting laboratory.  This laboratory is certified under the Clinical Laboratory Improvement Amendments CLIA as qualified to perform high complexity clinical laboratory testing.    TRIMETH /SULFA  Value in next row Sensitive      2 SENSITIVEThis is a modified FDA-approved test that has been validated and its performance characteristics determined by the reporting laboratory.  This laboratory is certified under the Clinical Laboratory Improvement Amendments CLIA as qualified to perform high complexity clinical laboratory testing.    AMPICILLIN /SULBACTAM Value in next row Sensitive      2 SENSITIVEThis is a modified FDA-approved test that has been validated and its performance characteristics determined by the reporting laboratory.  This laboratory is certified under the Clinical Laboratory Improvement Amendments CLIA as qualified to perform high complexity clinical laboratory testing.    PIP/TAZO Value in next row Sensitive      <=4 SENSITIVEThis is a modified FDA-approved test that has been validated and its performance characteristics determined by the reporting laboratory.  This laboratory is certified under the Clinical Laboratory Improvement Amendments CLIA as qualified to perform high complexity clinical laboratory testing.    MEROPENEM Value in next row Sensitive      <=4 SENSITIVEThis is a modified FDA-approved test that has been validated and its performance characteristics determined by the reporting laboratory.  This laboratory is certified under the Clinical Laboratory Improvement Amendments CLIA as qualified to perform high complexity clinical laboratory testing.    * >=100,000 COLONIES/mL PROTEUS MIRABILIS  Blood culture  (routine x 2)     Status: None   Collection Time: 05/23/24  4:19 PM   Specimen: BLOOD  Result Value Ref Range Status   Specimen Description BLOOD SITE NOT SPECIFIED  Final   Special Requests   Final    BOTTLES DRAWN AEROBIC AND ANAEROBIC Blood Culture results may not be optimal due to an inadequate volume of blood received in culture bottles   Culture   Final    NO GROWTH 5 DAYS Performed at [facility], [address] [place], [place] [address]    Report Status 05/28/2024 FINAL  Final    Labs: CBC: Recent Labs  Lab 05/22/24 2029 05/24/24 0526 05/25/24 0606 05/26/24 0426 05/27/24 0523  WBC 16.2* 23.4* 12.1* 11.1* 8.6  NEUTROABS 15.6*  --  10.0* 8.7* 6.2  HGB 15.8 11.8* 9.8* 11.1* 12.7*  HCT 48.1 36.0* 30.5* 33.9* 37.5*  MCV 90.9 91.8 93.8 91.4 88.9  PLT 195 102* 88* 108* 123*   Basic Metabolic Panel: Recent Labs  Lab 05/22/24 2029 05/24/24 0526 05/25/24 0606 05/27/24 0523  NA 139 140 141 139  K 4.5 4.1 3.3* 4.3  CL 105 107 115* 106  CO2 18* 22 19* 25  GLUCOSE 150* 119* 89 102*  BUN 26* 40* 25* 20  CREATININE 1.12 0.94 0.54* 0.62  CALCIUM  9.6 8.5* 7.1* 9.0   Liver Function Tests: Recent Labs  Lab 05/22/24 2029  AST  29  ALT 28  ALKPHOS 118  BILITOT 0.6  PROT 8.0  ALBUMIN 4.0   CBG: Recent Labs  Lab 05/23/24 0207  GLUCAP 140*    Discharge time spent: 41 minutes.  Signed: Deliliah Room, MD [facility] 05/28/2024 "

## 2024-05-28 NOTE — Plan of Care (Signed)

## 2024-05-28 NOTE — Discharge Instructions (Signed)
 SABRA

## 2024-05-28 NOTE — TOC Transition Note (Signed)
 Transition of Care [facility]) - Discharge Note   Patient Details  Name: Gregory Kerr MRN: [id number] Date of Birth: [birth year]  [facility] CM/SW Contact:  Sherline Clack, LCSWA Phone Number: 05/28/2024, 12:28 PM   Clinical Narrative:     Patient will DC to: [name] Anticipated DC date: 05/28/2024  Family notified: Vina Jessup/cousin Transport by: ROME   Per MD patient ready for DC to [facility]. RN to call report prior to discharge [phone] for room 805b). RN, patient, patient's family, and facility notified of DC. Discharge Summary and FL2 sent to facility. DC packet on chart. Ambulance transport requested for patient.   CSW will sign off for now as social work intervention is no longer needed. Please consult us  again if new needs arise.    Final Next Level of Care: Skilled Nursing Facility Barriers to Discharge: Barriers Resolved   Patient Goals and CMS Choice     Choice offered to / list presented to : Patient      Discharge Placement              Patient chooses bed at: [facility] Patient to be transferred to facility by: [facility] Name of family member notified: Vina Jessu/cousin: [phone] Patient and family notified of of transfer: 05/28/24  Discharge Plan and Services Additional resources added to the After Visit Summary for                                       Social Drivers of Health (SDOH) Interventions SDOH Screenings   Food Insecurity: No Food Insecurity (05/23/2024)  Housing: Low Risk (05/25/2024)  Transportation Needs: No Transportation Needs (05/24/2024)  Utilities: Not At Risk (05/24/2024)  Social Connections: Socially Isolated (05/27/2024)  Tobacco Use: Medium Risk (05/23/2024)     Readmission Risk Interventions    02/15/2022   12:13 PM 10/13/2021   12:30 PM  Readmission Risk Prevention Plan  Transportation Screening Complete Complete  Medication Review Ocean[facility]) Complete Referral to  Pharmacy  PCP or Specialist appointment within 3-5 days of discharge Complete Not Complete  PCP/Specialist Appt Not Complete comments  Still inpatient without discharge date at this time  HRI or Home Care Consult Complete Not Complete  HRI or Home Care Consult Pt Refusal Comments  Patient is LTC at SNF  SW Recovery Care/Counseling Consult Complete Complete  Palliative Care Screening Complete Not Applicable  Skilled Nursing Facility Complete Complete

## 2024-05-28 NOTE — Progress Notes (Signed)
 Patient will be going back to [facility], family aware

## 2024-05-30 ENCOUNTER — Emergency Department (HOSPITAL_COMMUNITY)

## 2024-05-30 ENCOUNTER — Other Ambulatory Visit: Payer: Self-pay

## 2024-05-30 ENCOUNTER — Inpatient Hospital Stay (HOSPITAL_COMMUNITY)
Admission: EM | Admit: 2024-05-30 | Discharge: 2024-06-06 | DRG: 871 | Disposition: A | Discharge status: Skilled Nursing Facility | Attending: Internal Medicine | Admitting: Internal Medicine

## 2024-05-30 DIAGNOSIS — Z87891 Personal history of nicotine dependence: Secondary | ICD-10-CM

## 2024-05-30 DIAGNOSIS — N3000 Acute cystitis without hematuria: Secondary | ICD-10-CM | POA: Diagnosis present

## 2024-05-30 DIAGNOSIS — R627 Adult failure to thrive: Secondary | ICD-10-CM | POA: Diagnosis present

## 2024-05-30 DIAGNOSIS — K3189 Other diseases of stomach and duodenum: Secondary | ICD-10-CM | POA: Diagnosis present

## 2024-05-30 DIAGNOSIS — Z961 Presence of intraocular lens: Secondary | ICD-10-CM | POA: Diagnosis present

## 2024-05-30 DIAGNOSIS — Z8249 Family history of ischemic heart disease and other diseases of the circulatory system: Secondary | ICD-10-CM

## 2024-05-30 DIAGNOSIS — I69354 Hemiplegia and hemiparesis following cerebral infarction affecting left non-dominant side: Secondary | ICD-10-CM

## 2024-05-30 DIAGNOSIS — Z9841 Cataract extraction status, right eye: Secondary | ICD-10-CM

## 2024-05-30 DIAGNOSIS — R6510 Systemic inflammatory response syndrome (SIRS) of non-infectious origin without acute organ dysfunction: Secondary | ICD-10-CM

## 2024-05-30 DIAGNOSIS — Z1624 Resistance to multiple antibiotics: Secondary | ICD-10-CM | POA: Diagnosis present

## 2024-05-30 DIAGNOSIS — I7121 Aneurysm of the ascending aorta, without rupture: Secondary | ICD-10-CM | POA: Diagnosis present

## 2024-05-30 DIAGNOSIS — J1081 Influenza due to other identified influenza virus with encephalopathy: Secondary | ICD-10-CM | POA: Diagnosis present

## 2024-05-30 DIAGNOSIS — E785 Hyperlipidemia, unspecified: Secondary | ICD-10-CM | POA: Diagnosis present

## 2024-05-30 DIAGNOSIS — K219 Gastro-esophageal reflux disease without esophagitis: Secondary | ICD-10-CM | POA: Diagnosis present

## 2024-05-30 DIAGNOSIS — J101 Influenza due to other identified influenza virus with other respiratory manifestations: Secondary | ICD-10-CM | POA: Diagnosis present

## 2024-05-30 DIAGNOSIS — Z7902 Long term (current) use of antithrombotics/antiplatelets: Secondary | ICD-10-CM

## 2024-05-30 DIAGNOSIS — J09X9 Influenza due to identified novel influenza A virus with other manifestations: Principal | ICD-10-CM

## 2024-05-30 DIAGNOSIS — R338 Other retention of urine: Secondary | ICD-10-CM | POA: Diagnosis present

## 2024-05-30 DIAGNOSIS — I110 Hypertensive heart disease with heart failure: Secondary | ICD-10-CM | POA: Diagnosis present

## 2024-05-30 DIAGNOSIS — K449 Diaphragmatic hernia without obstruction or gangrene: Secondary | ICD-10-CM | POA: Diagnosis present

## 2024-05-30 DIAGNOSIS — M810 Age-related osteoporosis without current pathological fracture: Secondary | ICD-10-CM | POA: Diagnosis present

## 2024-05-30 DIAGNOSIS — J9811 Atelectasis: Secondary | ICD-10-CM | POA: Diagnosis present

## 2024-05-30 DIAGNOSIS — Z9842 Cataract extraction status, left eye: Secondary | ICD-10-CM

## 2024-05-30 DIAGNOSIS — G9341 Metabolic encephalopathy: Secondary | ICD-10-CM | POA: Diagnosis present

## 2024-05-30 DIAGNOSIS — I714 Abdominal aortic aneurysm, without rupture, unspecified: Secondary | ICD-10-CM | POA: Diagnosis present

## 2024-05-30 DIAGNOSIS — Z66 Do not resuscitate: Secondary | ICD-10-CM | POA: Diagnosis present

## 2024-05-30 DIAGNOSIS — N3001 Acute cystitis with hematuria: Secondary | ICD-10-CM | POA: Diagnosis present

## 2024-05-30 DIAGNOSIS — M4856XA Collapsed vertebra, not elsewhere classified, lumbar region, initial encounter for fracture: Secondary | ICD-10-CM | POA: Diagnosis present

## 2024-05-30 DIAGNOSIS — Z79899 Other long term (current) drug therapy: Secondary | ICD-10-CM

## 2024-05-30 DIAGNOSIS — Z1152 Encounter for screening for COVID-19: Secondary | ICD-10-CM

## 2024-05-30 DIAGNOSIS — J209 Acute bronchitis, unspecified: Secondary | ICD-10-CM | POA: Diagnosis present

## 2024-05-30 DIAGNOSIS — B952 Enterococcus as the cause of diseases classified elsewhere: Secondary | ICD-10-CM | POA: Diagnosis present

## 2024-05-30 DIAGNOSIS — R54 Age-related physical debility: Secondary | ICD-10-CM | POA: Diagnosis present

## 2024-05-30 DIAGNOSIS — E860 Dehydration: Secondary | ICD-10-CM | POA: Diagnosis present

## 2024-05-30 DIAGNOSIS — A4189 Other specified sepsis: Principal | ICD-10-CM | POA: Diagnosis present

## 2024-05-30 DIAGNOSIS — N401 Enlarged prostate with lower urinary tract symptoms: Secondary | ICD-10-CM | POA: Diagnosis present

## 2024-05-30 DIAGNOSIS — I5032 Chronic diastolic (congestive) heart failure: Secondary | ICD-10-CM | POA: Diagnosis present

## 2024-05-30 LAB — URINALYSIS, W/ REFLEX TO CULTURE (INFECTION SUSPECTED)
Bacteria, UA: NONE SEEN
Bilirubin Urine: NEGATIVE
Glucose, UA: NEGATIVE mg/dL
Ketones, ur: NEGATIVE mg/dL
Nitrite: NEGATIVE
Protein, ur: 100 mg/dL — AB
RBC / HPF: >50 RBC/hpf (ref 0–5)
Specific Gravity, Urine: 1.025 (ref 1.005–1.030)
WBC, UA: >50 WBC/hpf (ref 0–5)
pH: 5.5 (ref 5.0–8.0)

## 2024-05-30 LAB — CREATININE, SERUM
Creatinine, Ser: 0.73 mg/dL (ref 0.61–1.24)
GFR, Estimated: >60 mL/min

## 2024-05-30 LAB — COMPREHENSIVE METABOLIC PANEL WITH GFR
ALT: 33 U/L (ref 0–44)
AST: 31 U/L (ref 15–41)
Albumin: 3.2 g/dL — ABNORMAL LOW (ref 3.5–5.0)
Alkaline Phosphatase: 80 U/L (ref 38–126)
Anion gap: 13 (ref 5–15)
BUN: 18 mg/dL (ref 8–23)
CO2: 22 mmol/L (ref 22–32)
Calcium: 8.7 mg/dL — ABNORMAL LOW (ref 8.9–10.3)
Chloride: 107 mmol/L (ref 98–111)
Creatinine, Ser: 0.85 mg/dL (ref 0.61–1.24)
GFR, Estimated: >60 mL/min
Glucose, Bld: 155 mg/dL — ABNORMAL HIGH (ref 70–99)
Potassium: 3.7 mmol/L (ref 3.5–5.1)
Sodium: 141 mmol/L (ref 135–145)
Total Bilirubin: 0.4 mg/dL (ref 0.0–1.2)
Total Protein: 6.9 g/dL (ref 6.5–8.1)

## 2024-05-30 LAB — CBC WITH DIFFERENTIAL/PLATELET
Abs Immature Granulocytes: 0.1 K/uL — ABNORMAL HIGH (ref 0.00–0.07)
Basophils Absolute: 0 K/uL (ref 0.0–0.1)
Basophils Relative: 0 %
Eosinophils Absolute: 0 K/uL (ref 0.0–0.5)
Eosinophils Relative: 0 %
HCT: 42.2 % (ref 39.0–52.0)
Hemoglobin: 13.2 g/dL (ref 13.0–17.0)
Immature Granulocytes: 1 %
Lymphocytes Relative: 20 %
Lymphs Abs: 2.1 K/uL (ref 0.7–4.0)
MCH: 29.3 pg (ref 26.0–34.0)
MCHC: 31.3 g/dL (ref 30.0–36.0)
MCV: 93.6 fL (ref 80.0–100.0)
Monocytes Absolute: 0.6 K/uL (ref 0.1–1.0)
Monocytes Relative: 6 %
Neutro Abs: 7.9 K/uL — ABNORMAL HIGH (ref 1.7–7.7)
Neutrophils Relative %: 73 %
Platelets: 203 K/uL (ref 150–400)
RBC: 4.51 MIL/uL (ref 4.22–5.81)
RDW: 13.8 % (ref 11.5–15.5)
WBC: 10.9 K/uL — ABNORMAL HIGH (ref 4.0–10.5)
nRBC: 0 % (ref 0.0–0.2)

## 2024-05-30 LAB — PROTIME-INR
INR: 1.1 (ref 0.8–1.2)
Prothrombin Time: 14.8 s (ref 11.4–15.2)

## 2024-05-30 LAB — RESP PANEL BY RT-PCR (RSV, FLU A&B, COVID)  RVPGX2
Influenza A by PCR: POSITIVE — AB
Influenza B by PCR: NEGATIVE
Resp Syncytial Virus by PCR: NEGATIVE
SARS Coronavirus 2 by RT PCR: NEGATIVE

## 2024-05-30 LAB — I-STAT CG4 LACTIC ACID, ED
Lactic Acid, Venous: 2.5 mmol/L (ref 0.5–1.9)
Lactic Acid, Venous: 2.7 mmol/L (ref 0.5–1.9)
Lactic Acid, Venous: 1.4 mmol/L (ref 0.5–1.9)
Lactic Acid, Venous: 1.3 mmol/L (ref 0.5–1.9)

## 2024-05-30 LAB — CBC
HCT: 33.7 % — ABNORMAL LOW (ref 39.0–52.0)
Hemoglobin: 10.7 g/dL — ABNORMAL LOW (ref 13.0–17.0)
MCH: 29.6 pg (ref 26.0–34.0)
MCHC: 31.8 g/dL (ref 30.0–36.0)
MCV: 93.4 fL (ref 80.0–100.0)
Platelets: 154 K/uL (ref 150–400)
RBC: 3.61 MIL/uL — ABNORMAL LOW (ref 4.22–5.81)
RDW: 13.9 % (ref 11.5–15.5)
WBC: 8.3 K/uL (ref 4.0–10.5)
nRBC: 0 % (ref 0.0–0.2)

## 2024-05-30 MED ORDER — LACTATED RINGERS IV SOLN: INTRAVENOUS | Status: DC

## 2024-05-30 MED ORDER — SERTRALINE HCL 50 MG PO TABS
125.0000 mg | ORAL_TABLET | Freq: Every day | ORAL | Status: DC
  Administered 2024-05-31 – 2024-06-06 (×7): 125 mg via ORAL
  Filled 2024-05-30 (×7): qty 3

## 2024-05-30 MED ORDER — ACETAMINOPHEN 650 MG RE SUPP
650.0000 mg | Freq: Once | RECTAL | Status: AC
  Administered 2024-05-30: 650 mg via RECTAL
  Filled 2024-05-30: qty 1

## 2024-05-30 MED ORDER — VANCOMYCIN HCL 2000 MG/400ML IV SOLN
2000.0000 mg | Freq: Once | INTRAVENOUS | Status: AC
  Administered 2024-05-30: 2000 mg via INTRAVENOUS
  Filled 2024-05-30: qty 400

## 2024-05-30 MED ORDER — GABAPENTIN 100 MG PO CAPS
200.0000 mg | ORAL_CAPSULE | Freq: Three times a day (TID) | ORAL | Status: DC
  Administered 2024-05-30 – 2024-06-06 (×20): 200 mg via ORAL
  Filled 2024-05-30 (×21): qty 2

## 2024-05-30 MED ORDER — CLOPIDOGREL BISULFATE 75 MG PO TABS
75.0000 mg | ORAL_TABLET | Freq: Every day | ORAL | Status: DC
  Administered 2024-05-30 – 2024-06-06 (×8): 75 mg via ORAL
  Filled 2024-05-30 (×8): qty 1

## 2024-05-30 MED ORDER — POLYETHYLENE GLYCOL 3350 17 G PO PACK: 17.0000 g | PACK | Freq: Every day | ORAL | Status: DC | PRN

## 2024-05-30 MED ORDER — OSELTAMIVIR PHOSPHATE 75 MG PO CAPS
75.0000 mg | ORAL_CAPSULE | Freq: Once | ORAL | Status: AC
  Administered 2024-05-30: 75 mg via ORAL
  Filled 2024-05-30: qty 1

## 2024-05-30 MED ORDER — SODIUM CHLORIDE 0.9 % IV SOLN
2.0000 g | Freq: Once | INTRAVENOUS | Status: AC
  Administered 2024-05-30: 2 g via INTRAVENOUS
  Filled 2024-05-30: qty 12.5

## 2024-05-30 MED ORDER — TAMSULOSIN HCL 0.4 MG PO CAPS
0.4000 mg | ORAL_CAPSULE | Freq: Every day | ORAL | Status: DC
  Administered 2024-05-30 – 2024-06-05 (×7): 0.4 mg via ORAL
  Filled 2024-05-30 (×7): qty 1

## 2024-05-30 MED ORDER — IPRATROPIUM-ALBUTEROL 0.5-2.5 (3) MG/3ML IN SOLN
3.0000 mL | Freq: Four times a day (QID) | RESPIRATORY_TRACT | Status: DC | PRN
  Administered 2024-05-30: 3 mL via RESPIRATORY_TRACT
  Filled 2024-05-30: qty 3

## 2024-05-30 MED ORDER — OSELTAMIVIR PHOSPHATE 30 MG PO CAPS: 30.0000 mg | ORAL_CAPSULE | Freq: Two times a day (BID) | ORAL | Status: DC

## 2024-05-30 MED ORDER — BACLOFEN 10 MG PO TABS
20.0000 mg | ORAL_TABLET | Freq: Three times a day (TID) | ORAL | Status: DC
  Administered 2024-05-30 – 2024-06-06 (×20): 20 mg via ORAL
  Filled 2024-05-30 (×23): qty 2

## 2024-05-30 MED ORDER — ACETAMINOPHEN 325 MG PO TABS
650.0000 mg | ORAL_TABLET | Freq: Four times a day (QID) | ORAL | Status: DC | PRN
  Administered 2024-05-31 – 2024-06-02 (×5): 650 mg via ORAL
  Filled 2024-05-30 (×5): qty 2

## 2024-05-30 MED ORDER — PANTOPRAZOLE SODIUM 20 MG PO TBEC
20.0000 mg | DELAYED_RELEASE_TABLET | Freq: Every day | ORAL | Status: DC
  Administered 2024-05-31: 20 mg via ORAL
  Filled 2024-05-30: qty 1

## 2024-05-30 MED ORDER — SERTRALINE HCL 100 MG PO TABS: 100.0000 mg | ORAL_TABLET | Freq: Every day | ORAL | Status: DC

## 2024-05-30 MED ORDER — ATORVASTATIN CALCIUM 40 MG PO TABS
40.0000 mg | ORAL_TABLET | Freq: Every evening | ORAL | Status: DC
  Administered 2024-05-31 – 2024-06-05 (×6): 40 mg via ORAL
  Filled 2024-05-30 (×6): qty 1

## 2024-05-30 MED ORDER — LACTATED RINGERS IV BOLUS
30.0000 mL/kg | Freq: Once | INTRAVENOUS | Status: AC
  Administered 2024-05-30: 1000 mL via INTRAVENOUS

## 2024-05-30 MED ORDER — TRIMETHOPRIM 100 MG PO TABS
100.0000 mg | ORAL_TABLET | Freq: Every day | ORAL | Status: DC
  Administered 2024-05-30 – 2024-06-05 (×7): 100 mg via ORAL
  Filled 2024-05-30 (×8): qty 1

## 2024-05-30 MED ORDER — CIPROFLOXACIN HCL 750 MG PO TABS
750.0000 mg | ORAL_TABLET | Freq: Two times a day (BID) | ORAL | Status: AC
  Administered 2024-05-30 – 2024-06-03 (×9): 750 mg via ORAL
  Filled 2024-05-30 (×9): qty 1

## 2024-05-30 MED ORDER — OSELTAMIVIR PHOSPHATE 75 MG PO CAPS
75.0000 mg | ORAL_CAPSULE | Freq: Two times a day (BID) | ORAL | Status: AC
  Administered 2024-05-31 – 2024-06-04 (×9): 75 mg via ORAL
  Filled 2024-05-30 (×9): qty 1

## 2024-05-30 MED ORDER — ENOXAPARIN SODIUM 40 MG/0.4ML IJ SOSY
40.0000 mg | PREFILLED_SYRINGE | INTRAMUSCULAR | Status: DC
  Administered 2024-05-30 – 2024-06-05 (×7): 40 mg via SUBCUTANEOUS
  Filled 2024-05-30 (×7): qty 0.4

## 2024-05-30 NOTE — Sepsis Progress Note (Signed)
 Notified provider of need to order repeat lactic acid as the second lactic was greater than the first.

## 2024-05-30 NOTE — ED Triage Notes (Signed)
 Pt BIB [facility] from [facility], pt was recently admitted for pneumonia and UTI, discharged 1/7. Today facility reports pt is more lethargic. 5 albuterol ,  0.5 atrovent PTA. 90% room air upon EMS arrival.   HR 110 RR 40 160/76 [id number]

## 2024-05-30 NOTE — ED Notes (Signed)
 Floor called, pt is on the way up

## 2024-05-30 NOTE — ED Notes (Signed)
 RN contacted pharmacy again about getting pt's medications verified.  They explained that they are waiting on the facility to verify the meds they have given the pt.

## 2024-05-30 NOTE — Sepsis Progress Note (Signed)
 eLink is following this Code Sepsis.

## 2024-05-30 NOTE — ED Notes (Signed)
 Pharmacy notified of delay of verification of home medications.

## 2024-05-30 NOTE — H&P (Addendum)
 " History and Physical    Patient: Gregory Kerr FMW:[id number] DOB: Oct 08, 1940 DOA: 05/30/2024 DOS: the patient was seen and examined on 05/30/2024 PCP: Donnella Agreste  Patient coming from: Home  Chief Complaint:  Chief Complaint  Patient presents with   Weakness   HPI: Gregory Kerr is a 84 y.o. male with medical history significant of HFpEF, CVA w residual deficits/L sided hemiparesis, HTN, HLD, MDR UTIs, BPH, and recent admission from 1/1-7 for sepsis 2/2 Proteus and Enterococcus acute cystitis c/b acute urinary retention (s/p b/l JJ stents per Urology Dr. Elisabeth on 1/2; IV CTX--> IV Zosyn  and discharged on PO ciprofloxacin  750mg  BID for 7 day course through 1/13 per ID Dr. Overton) who p/w worsening confusion and found to have FTT iso inluenza A.   Pt is unable to provide a medical history. He reports being unaware of his current location, but does not feel well. Family unable to be reached by phone.  In the ED, pt febrile 103.5, tachycardic, and tachypneic w/o hypoxia. Labs notable for lactic acid 2.7>1.4 and influenza A positivity on nasal swab. EDP requested admission given poor overall clinical disposition.   Review of Systems: As mentioned in the history of present illness. All other systems reviewed and are negative. 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   Pneumonia    once or twice (09/22/2014)   Retinal detachment    left eye   Stroke [facility]) 2007   left side doesn't work now (09/22/2014)   Past Surgical History:  Procedure Laterality Date   APPENDECTOMY  05/27/2009   Jeoffrey Dawn, MD   CATARACT EXTRACTION W/ INTRAOCULAR LENS  IMPLANT, BILATERAL Bilateral ?2014   CHOLECYSTECTOMY N/A 09/26/2014    Procedure: LAPAROSCOPIC CHOLECYSTECTOMY WITH INTRAOPERATIVE CHOLANGIOGRAM;  Surgeon: Donnice Lima, MD;  Location: [facility] OR;  Service: General;  Laterality: N/A;   COLONOSCOPY WITH PROPOFOL  N/A 12/29/2015   Procedure: COLONOSCOPY WITH PROPOFOL ;  Surgeon: Jerrell Sol, MD;  Location: [facility] ENDOSCOPY;  Service: Endoscopy;  Laterality: N/A;   CYSTOSCOPY W/ URETERAL STENT PLACEMENT Bilateral 05/23/2024   Procedure: CYSTOSCOPY WITH LEFT RETROGRADE PYELOGRAM AND BILATARAL URETERAL STENT INSERTION;  Surgeon: Elisabeth Valli BIRCH, MD;  Location: [facility] OR;  Service: Urology;  Laterality: Bilateral;   ERCP N/A 09/25/2014   Procedure: ENDOSCOPIC RETROGRADE CHOLANGIOPANCREATOGRAPHY (ERCP);  Surgeon: Oliva Boots, MD;  Location: [facility] ENDOSCOPY;  Service: Endoscopy;  Laterality: N/A;   IR CATHETER TUBE CHANGE  05/24/2021   PARS PLANA VITRECTOMY Left 01/30/2019   Procedure: PARS PLANA VITRECTOMY WITH 25 GAUGE;  Surgeon: Jarold Mayo, MD;  Location: [facility] OR;  Service: Ophthalmology;  Laterality: Left;   PARS PLANA VITRECTOMY Right 11/13/2019   Procedure: PARS PLANA VITRECTOMY WITH 25 GAUGE WITH REMOVAL OF POSTERIOR CAPSULAR OPACIFICATION WITH VITRECTOR;  Surgeon: Jarold Mayo, MD;  Location: [facility] OR;  Service: Ophthalmology;  Laterality: Right;   PHOTOCOAGULATION WITH LASER Left 01/30/2019   Procedure: PHOTOCOAGULATION WITH LASER;  Surgeon: Jarold Mayo, MD;  Location: [facility] OR;  Service: Ophthalmology;  Laterality: Left;   Social History:  reports that he quit smoking about 18 years ago. His smoking use included cigarettes. He smoked an average of 1 pack per day. He has never used smokeless tobacco. He reports  that he does not currently use alcohol . He reports that he does not use drugs.  Allergies[1]  Family History  Problem Relation Age of Onset   Heart attack Mother    Heart attack Father    Cancer Maternal Aunt        Stomach   Cancer Paternal Wylie Kennyth Proud    Prior to Admission medications  Medication Sig  Start Date End Date Taking? Authorizing Provider  acetaminophen  (TYLENOL ) 650 MG CR tablet Take 650 mg by mouth in the morning and at bedtime.    [provider]  albuterol  (VENTOLIN  HFA) 108 (90 Base) MCG/ACT inhaler Inhale 1-2 puffs into the lungs every 6 (six) hours as needed for wheezing or shortness of breath. 07/15/22   Prosperi, Christian H, PA-C  Amino Acids-Protein Hydrolys (PRO-STAT AWC) LIQD Take 30 mLs by mouth 2 (two) times daily with a meal.    [provider]  atorvastatin  (LIPITOR) 40 MG tablet Take 1 tablet (40 mg total) by mouth every evening. 05/28/24   Rashid, Farhan, MD  baclofen  (LIORESAL ) 20 MG tablet Take 20 mg by mouth 3 (three) times daily.    [provider]  bisacodyl  (DULCOLAX) 10 MG suppository Place 10 mg rectally daily as needed for moderate constipation. 10/18/21   [provider]  calcium -vitamin D  (OSCAL WITH D) 500-5 MG-MCG tablet Take 1 tablet by mouth 2 (two) times daily.    [provider]  ciprofloxacin  (CIPRO ) 750 MG tablet Take 1 tablet (750 mg total) by mouth 2 (two) times daily for 7 days. 05/28/24 06/04/24  Rashid, Farhan, MD  clopidogrel  (PLAVIX ) 75 MG tablet Take 1 tablet (75 mg total) by mouth daily. 11/22/20   Golda Lynwood PARAS, MD  clotrimazole-betamethasone (LOTRISONE) cream Apply 1 Application topically daily. 05/20/24   [provider]  Cranberry 450 MG TABS Take 450 mg by mouth daily.    [provider]  Dextromethorphan -quiNIDine (NUEDEXTA ) 20-10 MG capsule Take 1 capsule by mouth at bedtime. For Pseudobulbar Affect (PBA)    [provider]  gabapentin  (NEURONTIN ) 100 MG capsule Take 200 mg by mouth 3 (three) times daily.    [provider]  gentamicin ointment (GARAMYCIN) 0.1 % Apply 1 Application topically daily.    [provider]  HYDROcodone -acetaminophen  (NORCO/VICODIN) 5-325 MG tablet Take 1 tablet by mouth in the morning, at noon, and at bedtime.    [provider]  loperamide  (IMODIUM  A-D) 2 MG tablet Take 4 mg by mouth 4 (four) times daily as needed for diarrhea or loose stools.    [provider]  loratadine  (CLARITIN ) 10 MG tablet Take 10 mg by mouth at bedtime.    [provider]  nitroGLYCERIN  (NITROSTAT ) 0.4 MG SL tablet Place 0.4 mg under the tongue every 5 (five) minutes as needed for chest pain.    [provider]  pantoprazole  (PROTONIX ) 20 MG tablet Take 20 mg by mouth daily. 11/17/20   [provider]  phenazopyridine  (PYRIDIUM ) 100 MG tablet Take 100 mg by mouth every 8 (eight) hours as needed (bladder spasms).    [provider]  polyethylene glycol (MIRALAX  / GLYCOLAX ) packet Take 17 g by mouth daily as needed (constipation).     [provider]  Prenatal Vit-Fe Fumarate-FA (WESTAB PLUS) 27-1 MG TABS Take 1 tablet by mouth at bedtime.    [provider]  PREVIDENT 5000 BOOSTER PLUS 1.1 % PSTE Take 1 Application by mouth at  bedtime. 03/10/24   [provider]  saccharomyces boulardii (FLORASTOR) 250 MG capsule Take 250 mg by mouth every 12 (twelve) hours.    [provider]  sertraline  (ZOLOFT ) 100 MG tablet Take 100 mg by mouth daily. Take with 25mg  Sertraline  tablet for a combined dose 125mg .    [provider]  sertraline  (ZOLOFT ) 25 MG tablet Take 25 mg by mouth daily. Take with 100mg  Sertraline  tablet for a combined dose 125mg .    [provider]  tamsulosin  (FLOMAX ) 0.4 MG CAPS capsule Take 1 capsule (0.4 mg total) by mouth at bedtime. 11/21/20   Golda Lynwood PARAS, MD  trimethoprim  (TRIMPEX ) 100 MG tablet Take 100 mg by mouth at bedtime. **No stop date** per [facility] 02/03/22   [provider]    Physical Exam: Vitals:   05/30/24 1130 05/30/24 1145 05/30/24 1230 05/30/24 1345  BP: (!) 133/53 (!) 111/59 (!) 117/53 (!) 106/51  Pulse: (!) 112 (!) 110 (!) 102 91  Resp: 18 18 17 14   Temp:      TempSrc:      SpO2: 93% 94% 96% 94%    General: Alert, oriented x3, resting comfortably in no acute distress Respiratory: Lungs clear to auscultation bilaterally with normal respiratory effort; no w/r/r Cardiovascular: Regular rate and rhythm w/o m/r/g Abdomen: Soft, nontender, nondistended. Positive bowel sounds   Data Reviewed:  Lab Results  Component Value Date   WBC 10.9 (H) 05/30/2024   HGB 13.2 05/30/2024   HCT 42.2 05/30/2024   MCV 93.6 05/30/2024   PLT 203 05/30/2024   Lab Results  Component Value Date   GLUCOSE 155 (H) 05/30/2024   CALCIUM  8.7 (L) 05/30/2024   NA 141 05/30/2024   K 3.7 05/30/2024   CO2 22 05/30/2024   CL 107 05/30/2024   BUN 18 05/30/2024   CREATININE 0.85 05/30/2024   Lab Results  Component Value Date   ALT 33 05/30/2024   AST 31 05/30/2024   ALKPHOS 80 05/30/2024   BILITOT 0.4 05/30/2024   Lab Results  Component Value Date   INR 1.1 05/30/2024   INR 1.0 02/11/2022   INR 1.2 10/12/2021   Radiology: CT Renal Stone Study Result Date: 05/30/2024 CLINICAL DATA:  Flank pain. EXAM: CT ABDOMEN AND PELVIS WITHOUT CONTRAST TECHNIQUE: Multidetector CT imaging of the abdomen and pelvis was performed following the standard protocol without IV contrast. RADIATION DOSE REDUCTION: This exam was performed according to the departmental dose-optimization program which includes automated exposure control, adjustment of the mA and/or kV according to patient size and/or use of iterative reconstruction technique. COMPARISON:  May 22, 2024 FINDINGS: Lower chest: No acute abnormality. Hepatobiliary: No focal liver abnormality is seen. Status post cholecystectomy. No biliary dilatation. Pancreas: Diffuse pancreatic atrophy is seen. No pancreatic ductal dilatation or surrounding inflammatory changes. Spleen: Normal in size without focal abnormality. Adrenals/Urinary Tract: Adrenal glands are unremarkable. Kidneys are normal in size without focal lesions. Properly positioned bilateral endo ureteral stents  are seen. This represents a new finding when compared to the prior study. Numerous bilateral subcentimeter non-obstructing renal calculi are noted. A 3 mm ureteral calculus is seen within the mid left ureter (best seen on coronal reformatted image 50, CT series 6). A 2.6 cm urinary bladder calculus is seen within a poorly distended urinary bladder. Mild to moderate severity diffuse urinary bladder wall thickening is present with a mild amount of surrounding inflammatory fat stranding. Stomach/Bowel: There is a large, stable gastric hernia. The appendix is surgically absent. No  evidence of bowel wall thickening, distention, or inflammatory changes. Vascular/Lymphatic: Aortic atherosclerosis. No enlarged abdominal or pelvic lymph nodes. Reproductive: There is moderate to marked severity prostate gland enlargement. Other: A stable 5.4 cm x 5.3 cm right inguinal hernia is seen. This contains fat and normal appearing loops of distal ileum. A stable 4.1 cm x 5.9 cm fat containing left inguinal hernia is noted. No abdominopelvic ascites. Musculoskeletal: A chronic compression fracture deformity is seen at the level of L3 with multilevel degenerative changes present throughout the lumbar spine. IMPRESSION: 1. Properly positioned bilateral endo ureteral stents. 2. 3 mm mid left ureteral calculus. 3. Numerous bilateral subcentimeter non-obstructing renal calculi. 4. 2.6 cm urinary bladder calculus. 5. Mild to moderate severity diffuse urinary bladder wall thickening with a mild amount of surrounding inflammatory fat stranding. Correlation with urinalysis is recommended to exclude acute cystitis. 6. Large, stable gastric hernia. 7. Stable bilateral inguinal hernias, as described above. 8. Chronic compression fracture deformity at the level of L3. 9. Aortic atherosclerosis. Electronically Signed   By: Suzen Dials M.D.   On: 05/30/2024 12:49   DG Finger Thumb Left Result Date: 05/30/2024 CLINICAL DATA:  Left thumb pain  EXAM: LEFT THUMB 2+V COMPARISON:  None Available. FINDINGS: There is no evidence of fracture or dislocation. Minimal degenerative changes seen involving the first interphalangeal joint. Persistent flexion of this joint is noted as well. Soft tissues are unremarkable. IMPRESSION: No definite fracture or dislocation. Minimal degenerative change involving first interphalangeal joint with persistent flexion. Electronically Signed   By: Lynwood Landy Raddle M.D.   On: 05/30/2024 11:48   DG Chest Port 1 View Result Date: 05/30/2024 CLINICAL DATA:  Sepsis EXAM: PORTABLE CHEST 1 VIEW COMPARISON:  May 22, 2024 FINDINGS: Stable cardiomediastinal silhouette. Right lung is clear. Large left hiatal hernia is again noted. Minimal left basilar subsegmental atelectasis. Bony thorax is unremarkable. IMPRESSION: Large left hiatal hernia. Minimal left basilar subsegmental atelectasis. Electronically Signed   By: Lynwood Landy Raddle M.D.   On: 05/30/2024 11:46    Assessment and Plan: 59M h/o HFpEF, CVA w residual deficits/L sided hemiparesis, HTN, HLD, MDR UTIs, BPH, and recent admission from 1/1-7 for sepsis 2/2 Pseudomonas bacteremia nd Proteus/Enterococcus acute cystitis c/b acute urinary retention (s/p b/l JJ stents per Urology Dr. Elisabeth on 1/2; IV CTX--> IV Zosyn  and discharged on PO ciprofloxacin  750mg  BID for 7 day course through 1/13 per ID Dr. Overton) who p/w worsening confusion and found to have FTT iso inluenza A.   FTT Influenza A -PT/OT consulted; apprec eval/recs -Tamiflu  75mg  BID x 5 days for now -Ambulatory pulse ox prior to discharge  Proteus and Enterococcus acute cystitis c/b acute urinary retention Pseudomonas bacteremia S/p b/l JJ stents per Urology Dr. Elisabeth on 1/2. -PO ciprofloxacin  750mg  BID x 7 days per last admission (ends 1/13) -F/u EKG to monitor for QTC prolongation -OP Urology eval for removal of b/l stents on discharge -F/u blood cultures to ensure clearance  History of CVA with residual  deficit/left sided hemiparesis Patient with residual deficits from prior CVA and is nonambulatory at baseline. -PTA Plavix , baclofen , gabapentin , and Zoloft   Compression fracture Prior to arrival. Patient was found to have a compression fracture of L3 with 50% height loss. -Pain control per above -OP DEXA scan and treatment as indicated  Hiatal hernia Prior CT revealed large hiatal hernia containing entire stomach with gastric volvulus without obstruction. EGS consulted last admission and no intervention deemed necessary. 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 -OP annual screening   BPH -PTA Flomax    Advance Care Planning:   Code Status: Do not attempt resuscitation (DNR) PRE-ARREST INTERVENTIONS DESIRED   Consults: N/A  Family Communication: N/A  Severity of Illness: The appropriate patient status for this patient is INPATIENT. Inpatient status is judged to be reasonable and necessary in order to provide the required intensity of service to ensure the patient's safety. The patient's presenting symptoms, physical exam findings, and initial radiographic and laboratory data in the context of their chronic comorbidities is felt to place them at high risk for further clinical deterioration. Furthermore, it is not anticipated that the patient will be medically stable for discharge from the hospital within 2 midnights of admission.   * I certify that at the point of admission it is my clinical judgment that the patient will require inpatient hospital care spanning beyond 2 midnights from the point of admission due to high intensity of service, high risk for further deterioration and high frequency of surveillance required.*   ------- I spent 57 minutes reviewing previous notes, at the bedside counseling/discussing the treatment plan, and performing clinical documentation.  Author: Marsha Ada, MD 05/30/2024 3:10 PM  For on  call review [email or web address].      [1] No Active Allergies  "

## 2024-05-30 NOTE — ED Provider Notes (Signed)
 " [facility] Provider Note  CSN: [id number] Arrival date & time: 05/30/24 1004  Chief Complaint(s) Weakness  HPI Gregory Kerr is a 84 y.o. 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  Patient is a nursing home resident.  Recent admitted for UTI and stent.  Urine cultures grew Enterococcus and Pseudomonas.  Apparently developed fever and was sent to the ER.  Patient is not able to answer many questions.  He can follow some commands but reports the only thing bothering him is my thumb.  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   Pneumonia    once or twice (09/22/2014)   Retinal detachment    left eye   Stroke (HCC) 2007   left side doesn't work now (09/22/2014)   Patient Active Problem List   Diagnosis Date Noted   Influenza A 05/30/2024   Multiple drug resistant organism (MDRO) culture positive 05/27/2024   Sepsis secondary to UTI (HCC) 05/23/2024   Urolithiasis 05/23/2024   AKI (acute kidney injury) 05/23/2024   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   BPH (benign prostatic hyperplasia) 05/23/2024   AAA (abdominal aortic aneurysm) 05/23/2024   Closed compression fracture of L3 vertebra (HCC) 05/23/2024   SIRS (systemic inflammatory response syndrome) (HCC) 02/11/2022   Bacteremia    Acute renal failure superimposed on stage 3a chronic kidney disease (HCC) 06/29/2021   Bladder spasm 06/29/2021   Sepsis (HCC) 06/28/2021   Spasticity 06/28/2021   Prolonged QT interval 06/28/2021   Constipation  06/28/2021   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 03/08/2021   Generalized weakness    Acute CVA (cerebrovascular accident) (HCC) 11/19/2020   Allergic rhinitis due to allergen 04/27/2016   Chest pain 02/27/2016   Thoracic ascending aortic aneurysm 02/27/2016   Leukocytosis 02/27/2016   Major depression, chronic 02/16/2016   Neuropathic pain 02/16/2016   Diarrhea 12/29/2015   Coronary artery disease involving native coronary artery of native heart with angina pectoris 11/12/2015   History of CVA with residual deficit 10/14/2015   Hyperlipidemia 07/16/2015   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) 12/10/2014   Angina pectoris 12/10/2014   Chronic obstructive pulmonary disease (HCC) 12/10/2014   Irritable bowel syndrome with diarrhea 12/10/2014   Senile osteoporosis 12/10/2014   CVA (cerebral vascular accident) (HCC) 12/10/2014   Acute on chronic diastolic CHF (congestive heart failure) (HCC) 09/27/2014   Hypokalemia 09/23/2014   Pancreatitis, gallstone 09/22/2014   Chronic diastolic heart failure (HCC) 09/22/2014   Acute cholangitis (HCC) 09/22/2014   Gallstone pancreatitis 09/22/2014   Hiatal hernia with GERD 08/31/2014   Vitamin D  deficiency 08/03/2014   Hyperlipidemia LDL goal <100 08/03/2014   CVA, old, hemiparesis (HCC) 08/03/2014   Leg edema, left 08/03/2014   Gastroesophageal reflux disease without esophagitis 06/18/2014   Essential hypertension, benign 06/18/2014   Primary generalized hypertrophic osteoarthrosis 06/18/2014   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   UTI (urinary tract infection) 04/04/2014   Essential hypertension 04/03/2014   Other specified chronic obstructive pulmonary disease (HCC) 01/15/2014   Dermatophytosis of nail  12/18/2013   Essential hypertension 12/18/2013   Generalized osteoarthrosis, unspecified site 12/18/2013   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   GERD (gastroesophageal reflux disease) 08/11/2013   Disuse  osteoporosis 08/11/2013   Chronic pain syndrome 01/30/2013   IBS (irritable bowel syndrome) 01/30/2013   Cerebral infarct (HCC) 10/24/2012   HTN (hypertension) 10/24/2012   Osteoporosis 10/24/2012   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 Authorizing Provider  acetaminophen  (TYLENOL ) 650 MG CR tablet Take 650 mg by mouth in the morning and at bedtime.    [provider]  albuterol  (VENTOLIN  HFA) 108 (90 Base) MCG/ACT inhaler Inhale 1-2 puffs into the lungs every 6 (six) hours as needed for wheezing or shortness of breath. 07/15/22   Prosperi, Christian H, PA-C  Amino Acids-Protein Hydrolys (PRO-STAT AWC) LIQD Take 30 mLs by mouth 2 (two) times daily with a meal.    [provider]  atorvastatin  (LIPITOR) 40 MG tablet Take 1 tablet (40 mg total) by mouth every evening. 05/28/24   Rashid, Farhan, MD  baclofen  (LIORESAL ) 20 MG tablet Take 20 mg by mouth 3 (three) times daily.    [provider]  bisacodyl  (DULCOLAX) 10 MG suppository Place 10 mg rectally daily as needed for moderate constipation. 10/18/21   [provider]  calcium -vitamin D  (OSCAL WITH D) 500-5 MG-MCG tablet Take 1 tablet by mouth 2 (two) times daily.    [provider]  ciprofloxacin  (CIPRO ) 750 MG tablet Take 1 tablet (750 mg total) by mouth 2 (two) times daily for 7 days. 05/28/24 06/04/24  Rashid, Farhan, MD  clopidogrel  (PLAVIX ) 75 MG tablet Take 1 tablet (75 mg total) by mouth daily. 11/22/20   Golda Lynwood PARAS, MD  clotrimazole-betamethasone (LOTRISONE) cream Apply 1 Application topically daily. 05/20/24   [provider]  Cranberry 450 MG TABS Take 450 mg by mouth daily.    [provider]  Dextromethorphan -quiNIDine (NUEDEXTA ) 20-10 MG capsule Take 1 capsule by mouth at  bedtime. For Pseudobulbar Affect (PBA)    [provider]  gabapentin  (NEURONTIN ) 100 MG capsule Take 200 mg by mouth 3 (three) times daily.    [provider]  gentamicin ointment (GARAMYCIN) 0.1 % Apply 1 Application topically daily.    [provider]  HYDROcodone -acetaminophen  (NORCO/VICODIN) 5-325 MG tablet Take 1 tablet by mouth in the morning, at noon, and at bedtime.    [provider]  loperamide  (IMODIUM  A-D) 2 MG tablet Take 4 mg by mouth 4 (four) times daily as needed for diarrhea or loose stools.    [provider]  loratadine  (CLARITIN ) 10 MG tablet Take 10 mg by mouth at bedtime.    [provider]  nitroGLYCERIN  (NITROSTAT ) 0.4 MG SL tablet Place 0.4 mg under the tongue every 5 (five) minutes as needed for chest pain.    [provider]  pantoprazole  (PROTONIX ) 20 MG tablet Take 20 mg by mouth daily. 11/17/20   [provider]  phenazopyridine  (PYRIDIUM ) 100 MG tablet Take 100 mg by mouth every 8 (eight) hours as needed (bladder spasms).    [provider]  polyethylene glycol (MIRALAX  / GLYCOLAX ) packet Take 17 g by mouth daily as needed (constipation).     [provider]  Prenatal Vit-Fe Fumarate-FA (WESTAB PLUS) 27-1 MG TABS Take 1 tablet by mouth at bedtime.  [provider]  PREVIDENT 5000 BOOSTER PLUS 1.1 % PSTE Take 1 Application by mouth at bedtime. 03/10/24   [provider]  saccharomyces boulardii (FLORASTOR) 250 MG capsule Take 250 mg by mouth every 12 (twelve) hours.    [provider]  sertraline  (ZOLOFT ) 100 MG tablet Take 100 mg by mouth daily. Take with 25mg  Sertraline  tablet for a combined dose 125mg .    [provider]  sertraline  (ZOLOFT ) 25 MG tablet Take 25 mg by mouth daily. Take with 100mg  Sertraline  tablet for a combined dose 125mg .    [provider]  tamsulosin  (FLOMAX ) 0.4 MG CAPS capsule Take 1 capsule (0.4 mg total) by  mouth at bedtime. 11/21/20   Golda Lynwood PARAS, MD  trimethoprim  (TRIMPEX ) 100 MG tablet Take 100 mg by mouth at bedtime. **No stop date** per [facility] 02/03/22   [provider]                                                                                                                                    Past Surgical History Past Surgical History:  Procedure Laterality Date   APPENDECTOMY  05/27/2009   Jeoffrey Dawn, MD   CATARACT EXTRACTION W/ INTRAOCULAR LENS  IMPLANT, BILATERAL Bilateral ?2014   CHOLECYSTECTOMY N/A 09/26/2014   Procedure: LAPAROSCOPIC CHOLECYSTECTOMY WITH INTRAOPERATIVE CHOLANGIOGRAM;  Surgeon: Donnice Lima, MD;  Location: [facility] OR;  Service: General;  Laterality: N/A;   COLONOSCOPY WITH PROPOFOL  N/A 12/29/2015   Procedure: COLONOSCOPY WITH PROPOFOL ;  Surgeon: Jerrell Sol, MD;  Location: [facility] ENDOSCOPY;  Service: Endoscopy;  Laterality: N/A;   CYSTOSCOPY W/ URETERAL STENT PLACEMENT Bilateral 05/23/2024   Procedure: CYSTOSCOPY WITH LEFT RETROGRADE PYELOGRAM AND BILATARAL URETERAL STENT INSERTION;  Surgeon: Elisabeth Valli BIRCH, MD;  Location: [facility] OR;  Service: Urology;  Laterality: Bilateral;   ERCP N/A 09/25/2014   Procedure: ENDOSCOPIC RETROGRADE CHOLANGIOPANCREATOGRAPHY (ERCP);  Surgeon: Oliva Boots, MD;  Location: [facility] ENDOSCOPY;  Service: Endoscopy;  Laterality: N/A;   IR CATHETER TUBE CHANGE  05/24/2021   PARS PLANA VITRECTOMY Left 01/30/2019   Procedure: PARS PLANA VITRECTOMY WITH 25 GAUGE;  Surgeon: Jarold Mayo, MD;  Location: [facility] OR;  Service: Ophthalmology;  Laterality: Left;   PARS PLANA VITRECTOMY Right 11/13/2019   Procedure: PARS PLANA VITRECTOMY WITH 25 GAUGE WITH REMOVAL OF POSTERIOR CAPSULAR OPACIFICATION WITH VITRECTOR;  Surgeon: Jarold Mayo, MD;  Location: [facility] OR;  Service: Ophthalmology;  Laterality: Right;   PHOTOCOAGULATION WITH LASER Left 01/30/2019   Procedure: PHOTOCOAGULATION WITH LASER;  Surgeon: Jarold Mayo, MD;  Location: [facility] OR;  Service: Ophthalmology;   Laterality: Left;   Family History Family History  Problem Relation Age of Onset   Heart attack Mother    Heart attack Father    Cancer Maternal Aunt        Stomach   Cancer Paternal Aunt        Uterine,Colon    Social History Social History[1] Allergies  Patient has no active allergies.  Review of Systems Review of Systems  All other systems reviewed and are negative.   Physical Exam Vital Signs  I have reviewed the triage vital signs BP (!) 106/51   Pulse 91   Temp (!) 103.5 F (39.7 C) (Rectal)   Resp 14   SpO2 94%  Physical Exam Vitals and nursing note reviewed.  Constitutional:      General: He is in acute distress.     Appearance: Normal appearance.  HENT:     Mouth/Throat:     Mouth: Mucous membranes are dry.  Eyes:     Conjunctiva/sclera: Conjunctivae normal.  Cardiovascular:     Rate and Rhythm: Regular rhythm. Tachycardia present.     Pulses: Normal pulses.  Pulmonary:     Effort: Pulmonary effort is normal. No respiratory distress.     Breath sounds: Rhonchi (bilateral) present.  Abdominal:     General: Abdomen is flat.     Palpations: Abdomen is soft.     Tenderness: There is no abdominal tenderness.  Genitourinary:    Comments: No GU wounds Musculoskeletal:     Right lower leg: No edema.     Left lower leg: No edema.     Comments: Chronic left upper and lower extremity contracture.  Bandage in place over the left thumb.  Chronic appearing deformity of the left thumb  Skin:    General: Skin is warm and dry.     Capillary Refill: Capillary refill takes less than 2 seconds.  Neurological:     Mental Status: He is alert.     Comments: Oriented to self, knows he is in the hospital but does not know why he is here or the year.  Follows commands in the right upper and lower extremity  Psychiatric:        Mood and Affect: Mood normal.        Behavior: Behavior normal.     ED Results and Treatments Labs (all labs ordered are listed, but only  abnormal results are displayed) Labs Reviewed  RESP PANEL BY RT-PCR (RSV, FLU A&B, COVID)  RVPGX2 - Abnormal; Notable for the following components:      Result Value   Influenza A by PCR POSITIVE (*)    All other components within normal limits  CBC WITH DIFFERENTIAL/PLATELET - Abnormal; Notable for the following components:   WBC 10.9 (*)    Neutro Abs 7.9 (*)    Abs Immature Granulocytes 0.10 (*)    All other components within normal limits  URINALYSIS, W/ REFLEX TO CULTURE (INFECTION SUSPECTED) - Abnormal; Notable for the following components:   Hgb urine dipstick LARGE (*)    Protein, ur 100 (*)    Leukocytes,Ua SMALL (*)    All other components within normal limits  COMPREHENSIVE METABOLIC PANEL WITH GFR - Abnormal; Notable for the following components:   Glucose, Bld 155 (*)    Calcium  8.7 (*)    Albumin 3.2 (*)    All other components within normal limits  I-STAT CG4 LACTIC ACID, ED - Abnormal; Notable for the following components:   Lactic Acid, Venous 2.5 (*)    All other components within normal limits  I-STAT CG4 LACTIC ACID, ED - Abnormal; Notable for the following components:   Lactic Acid, Venous 2.7 (*)    All other components within normal limits  CULTURE, BLOOD (ROUTINE X 2)  CULTURE, BLOOD (ROUTINE X 2)  URINE CULTURE  PROTIME-INR  CBC  CREATININE,  SERUM  I-STAT CG4 LACTIC ACID, ED                                                                                                                          Radiology CT Renal Stone Study Result Date: 05/30/2024 CLINICAL DATA:  Flank pain. EXAM: CT ABDOMEN AND PELVIS WITHOUT CONTRAST TECHNIQUE: Multidetector CT imaging of the abdomen and pelvis was performed following the standard protocol without IV contrast. RADIATION DOSE REDUCTION: This exam was performed according to the departmental dose-optimization program which includes automated exposure control, adjustment of the mA and/or kV according to patient size and/or  use of iterative reconstruction technique. COMPARISON:  May 22, 2024 FINDINGS: Lower chest: No acute abnormality. Hepatobiliary: No focal liver abnormality is seen. Status post cholecystectomy. No biliary dilatation. Pancreas: Diffuse pancreatic atrophy is seen. No pancreatic ductal dilatation or surrounding inflammatory changes. Spleen: Normal in size without focal abnormality. Adrenals/Urinary Tract: Adrenal glands are unremarkable. Kidneys are normal in size without focal lesions. Properly positioned bilateral endo ureteral stents are seen. This represents a new finding when compared to the prior study. Numerous bilateral subcentimeter non-obstructing renal calculi are noted. A 3 mm ureteral calculus is seen within the mid left ureter (best seen on coronal reformatted image 50, CT series 6). A 2.6 cm urinary bladder calculus is seen within a poorly distended urinary bladder. Mild to moderate severity diffuse urinary bladder wall thickening is present with a mild amount of surrounding inflammatory fat stranding. Stomach/Bowel: There is a large, stable gastric hernia. The appendix is surgically absent. No evidence of bowel wall thickening, distention, or inflammatory changes. Vascular/Lymphatic: Aortic atherosclerosis. No enlarged abdominal or pelvic lymph nodes. Reproductive: There is moderate to marked severity prostate gland enlargement. Other: A stable 5.4 cm x 5.3 cm right inguinal hernia is seen. This contains fat and normal appearing loops of distal ileum. A stable 4.1 cm x 5.9 cm fat containing left inguinal hernia is noted. No abdominopelvic ascites. Musculoskeletal: A chronic compression fracture deformity is seen at the level of L3 with multilevel degenerative changes present throughout the lumbar spine. IMPRESSION: 1. Properly positioned bilateral endo ureteral stents. 2. 3 mm mid left ureteral calculus. 3. Numerous bilateral subcentimeter non-obstructing renal calculi. 4. 2.6 cm urinary bladder  calculus. 5. Mild to moderate severity diffuse urinary bladder wall thickening with a mild amount of surrounding inflammatory fat stranding. Correlation with urinalysis is recommended to exclude acute cystitis. 6. Large, stable gastric hernia. 7. Stable bilateral inguinal hernias, as described above. 8. Chronic compression fracture deformity at the level of L3. 9. Aortic atherosclerosis. Electronically Signed   By: Suzen Dials M.D.   On: 05/30/2024 12:49   DG Finger Thumb Left Result Date: 05/30/2024 CLINICAL DATA:  Left thumb pain EXAM: LEFT THUMB 2+V COMPARISON:  None Available. FINDINGS: There is no evidence of fracture or dislocation. Minimal degenerative changes seen involving the first interphalangeal joint. Persistent flexion of this joint is noted as well. Soft tissues are unremarkable. IMPRESSION:  No definite fracture or dislocation. Minimal degenerative change involving first interphalangeal joint with persistent flexion. Electronically Signed   By: Lynwood Landy Raddle M.D.   On: 05/30/2024 11:48   DG Chest Port 1 View Result Date: 05/30/2024 CLINICAL DATA:  Sepsis EXAM: PORTABLE CHEST 1 VIEW COMPARISON:  May 22, 2024 FINDINGS: Stable cardiomediastinal silhouette. Right lung is clear. Large left hiatal hernia is again noted. Minimal left basilar subsegmental atelectasis. Bony thorax is unremarkable. IMPRESSION: Large left hiatal hernia. Minimal left basilar subsegmental atelectasis. Electronically Signed   By: Lynwood Landy Raddle M.D.   On: 05/30/2024 11:46    Pertinent labs & imaging results that were available during my care of the patient were reviewed by me and considered in my medical decision making (see MDM for details).  Medications Ordered in ED Medications  lactated ringers  infusion ( Intravenous New Bag/Given 05/30/24 1428)  enoxaparin  (LOVENOX ) injection 40 mg (has no administration in time range)  oseltamivir  (TAMIFLU ) capsule 30 mg (has no administration in time range)  ceFEPIme   (MAXIPIME ) 2 g in sodium chloride  0.9 % 100 mL IVPB (0 g Intravenous Stopped 05/30/24 1127)  lactated ringers  bolus 2,352 mL (0 mLs Intravenous Stopped 05/30/24 1338)  vancomycin  (VANCOREADY) IVPB 2000 mg/400 mL (0 mg Intravenous Stopped 05/30/24 1336)  acetaminophen  (TYLENOL ) suppository 650 mg (650 mg Rectal Given 05/30/24 1136)  oseltamivir  (TAMIFLU ) capsule 75 mg (75 mg Oral Given 05/30/24 1429)                                                                                                                                     Procedures .Critical Care  Performed by: Francesca Elsie CROME, MD Authorized by: Francesca Elsie CROME, MD   Critical care provider statement:    Critical care time (minutes):  30   Critical care was necessary to treat or prevent imminent or life-threatening deterioration of the following conditions:  Sepsis   Critical care was time spent personally by me on the following activities:  Development of treatment plan with patient or surrogate, discussions with consultants, evaluation of patient's response to treatment, examination of patient, ordering and review of laboratory studies, ordering and review of radiographic studies, ordering and performing treatments and interventions, pulse oximetry, re-evaluation of patient's condition and review of old charts   Care discussed with: admitting provider     (including critical care time)  Medical Decision Making / ED Course   MDM:  84 year old presenting to the emergency department with fever, altered mental status.  Concern for likely sepsis.  Was recently admitted for UTI, suspect this was most likely source.  Will obtain urine catheterization sample.  Cover broadly given prior Pseudomonas and Enterococcus.  No history of ESBL infection.  Did have recent stent placement so we will obtain CT to evaluate for hydronephrosis or stent migration, other intra-abdominal process.  He does have some rhonchi in his lungs will obtain chest  x-ray  as well.  Will check flu, COVID and RSV swab.  No wounds or rashes noted on examination.  Denies any headache to suggest a CNS infection.  Anticipate patient will need to be admitted.  Patient also complains of pain to his thumb.  He has a chronic appearing deformity to the nail of the left thumb likely from his contracture, will obtain x-ray.  Clinical Course as of 05/30/24 1507  [place] May 30, 2024  1413 Workup notable for the flu which may explain patient's symptoms.  Will start Tamiflu .  Differential also includes UTI.  His stent is appropriately positioned but CT shows some bladder wall thickening, UA with WBC and RBC.  No bacteria, but patient has recently been on antibiotics so this could be falsely negative.  Urine culture has been sent.  He was complaining of thumb pain but thumb appears unremarkable on x-ray.  Chest x-ray is negative.  Signed out to hospitalist for admission. [WS]    Clinical Course User Index [WS] Francesca Elsie CROME, MD     Additional history obtained: -Additional history obtained from ems -External records from outside source obtained and reviewed including: Chart review including previous notes, labs, imaging, consultation notes including prior notes    Lab Tests: -I ordered, reviewed, and interpreted labs.   The pertinent results include:   Labs Reviewed  RESP PANEL BY RT-PCR (RSV, FLU A&B, COVID)  RVPGX2 - Abnormal; Notable for the following components:      Result Value   Influenza A by PCR POSITIVE (*)    All other components within normal limits  CBC WITH DIFFERENTIAL/PLATELET - Abnormal; Notable for the following components:   WBC 10.9 (*)    Neutro Abs 7.9 (*)    Abs Immature Granulocytes 0.10 (*)    All other components within normal limits  URINALYSIS, W/ REFLEX TO CULTURE (INFECTION SUSPECTED) - Abnormal; Notable for the following components:   Hgb urine dipstick LARGE (*)    Protein, ur 100 (*)    Leukocytes,Ua SMALL (*)    All other  components within normal limits  COMPREHENSIVE METABOLIC PANEL WITH GFR - Abnormal; Notable for the following components:   Glucose, Bld 155 (*)    Calcium  8.7 (*)    Albumin 3.2 (*)    All other components within normal limits  I-STAT CG4 LACTIC ACID, ED - Abnormal; Notable for the following components:   Lactic Acid, Venous 2.5 (*)    All other components within normal limits  I-STAT CG4 LACTIC ACID, ED - Abnormal; Notable for the following components:   Lactic Acid, Venous 2.7 (*)    All other components within normal limits  CULTURE, BLOOD (ROUTINE X 2)  CULTURE, BLOOD (ROUTINE X 2)  URINE CULTURE  PROTIME-INR  CBC  CREATININE, SERUM  I-STAT CG4 LACTIC ACID, ED    Notable for + flu, mild leukocytosis, possible UTI   EKG   EKG Interpretation Date/Time:  Friday May 30 2024 10:23:35 EST Ventricular Rate:  122 PR Interval:  151 QRS Duration:  84 QT Interval:  303 QTC Calculation: 432 R Axis:   6  Text Interpretation: Sinus tachycardia Ventricular premature complex Probable anterior infarct, age indeterminate Lateral leads are also involved Confirmed by Francesca Elsie ([id number]) on 05/30/2024 10:45:55 AM         Imaging Studies ordered: I ordered imaging studies including CXR, CT abdomen On my interpretation imaging demonstrates possible cystitis I independently visualized and interpreted imaging. I agree with the radiologist interpretation  Medicines ordered and prescription drug management: Meds ordered this encounter  Medications   lactated ringers  infusion   ceFEPIme  (MAXIPIME ) 2 g in sodium chloride  0.9 % 100 mL IVPB    Antibiotic Indication::   UTI   lactated ringers  bolus 2,352 mL   vancomycin  (VANCOREADY) IVPB 2000 mg/400 mL    Indication::   Sepsis   acetaminophen  (TYLENOL ) suppository 650 mg   oseltamivir  (TAMIFLU ) capsule 75 mg   enoxaparin  (LOVENOX ) injection 40 mg   DISCONTD: oseltamivir  (TAMIFLU ) capsule 30 mg   oseltamivir  (TAMIFLU ) capsule  30 mg    -I have reviewed the patients home medicines and have made adjustments as needed   Consultations Obtained: I requested consultation with the hospitalist,  and discussed lab and imaging findings as well as pertinent plan - they recommend: admission   Cardiac Monitoring: The patient was maintained on a cardiac monitor.  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    Final Clinical Impression(s) / ED Diagnoses Final diagnoses:  Encephalopathy due to influenza A virus  SIRS (systemic inflammatory response syndrome) (HCC)  Dehydration  Acute cystitis with hematuria     This chart was dictated using voice recognition software.  Despite best efforts to proofread,  errors can occur which can change the documentation meaning.     [1]  Social History Tobacco Use   Smoking status: Former    Current packs/day: 0.00    Average packs/day: 1.0 packs/day    Types: Cigarettes    Quit date: 12/20/2005    Years since quitting: 18.4   Smokeless tobacco: Never  Vaping Use   Vaping status: Never Used  Substance Use Topics   Alcohol  use: Not Currently     Comment: 09/22/2014 stopped in 2007; drank occasionally when I did drink   Drug use: No     Francesca Elsie CROME, MD 05/30/24 1507  "

## 2024-05-30 NOTE — ED Notes (Signed)
 When you have time, [name] (emergency contact/ cousin) [phone] would like an update on pt status. Thank you

## 2024-05-30 NOTE — Progress Notes (Signed)
 ED Pharmacy Antibiotic Sign Off An antibiotic consult was received from an ED provider for vancomycin  per pharmacy dosing for sepsis. A chart review was completed to assess appropriateness.   The following one time order(s) were placed:  Vancomycin  2000mg  IV x1 Cefepime  2gm IV already ordered by MD  Further antibiotic and/or antibiotic pharmacy consults should be ordered by the admitting provider if indicated.   Thank you for allowing pharmacy to be a part of this patient's care.   Elma Fail, PharmD PGY1 Clinical Pharmacist [facility]  05/30/2024 10:26 AM

## 2024-05-30 NOTE — Progress Notes (Signed)
 Received patient from ED.  On assessment, Blood was noted to be oozing from his urinary meatus.  An open wound wound was noted on his left thumb that involved his fingernail.

## 2024-05-31 DIAGNOSIS — G9341 Metabolic encephalopathy: Secondary | ICD-10-CM | POA: Diagnosis present

## 2024-05-31 DIAGNOSIS — N3001 Acute cystitis with hematuria: Secondary | ICD-10-CM | POA: Diagnosis present

## 2024-05-31 DIAGNOSIS — I7121 Aneurysm of the ascending aorta, without rupture: Secondary | ICD-10-CM | POA: Diagnosis present

## 2024-05-31 DIAGNOSIS — N3000 Acute cystitis without hematuria: Secondary | ICD-10-CM | POA: Diagnosis present

## 2024-05-31 DIAGNOSIS — J1081 Influenza due to other identified influenza virus with encephalopathy: Secondary | ICD-10-CM | POA: Diagnosis present

## 2024-05-31 DIAGNOSIS — N401 Enlarged prostate with lower urinary tract symptoms: Secondary | ICD-10-CM | POA: Diagnosis present

## 2024-05-31 DIAGNOSIS — J9811 Atelectasis: Secondary | ICD-10-CM | POA: Diagnosis present

## 2024-05-31 DIAGNOSIS — E785 Hyperlipidemia, unspecified: Secondary | ICD-10-CM | POA: Diagnosis present

## 2024-05-31 DIAGNOSIS — B952 Enterococcus as the cause of diseases classified elsewhere: Secondary | ICD-10-CM | POA: Diagnosis present

## 2024-05-31 DIAGNOSIS — A4189 Other specified sepsis: Secondary | ICD-10-CM | POA: Diagnosis present

## 2024-05-31 DIAGNOSIS — I5032 Chronic diastolic (congestive) heart failure: Secondary | ICD-10-CM | POA: Diagnosis present

## 2024-05-31 DIAGNOSIS — Z87891 Personal history of nicotine dependence: Secondary | ICD-10-CM | POA: Diagnosis not present

## 2024-05-31 DIAGNOSIS — Z66 Do not resuscitate: Secondary | ICD-10-CM | POA: Diagnosis present

## 2024-05-31 DIAGNOSIS — Z8249 Family history of ischemic heart disease and other diseases of the circulatory system: Secondary | ICD-10-CM | POA: Diagnosis not present

## 2024-05-31 DIAGNOSIS — Z7902 Long term (current) use of antithrombotics/antiplatelets: Secondary | ICD-10-CM | POA: Diagnosis not present

## 2024-05-31 DIAGNOSIS — M4856XA Collapsed vertebra, not elsewhere classified, lumbar region, initial encounter for fracture: Secondary | ICD-10-CM | POA: Diagnosis present

## 2024-05-31 DIAGNOSIS — Z1624 Resistance to multiple antibiotics: Secondary | ICD-10-CM | POA: Diagnosis present

## 2024-05-31 DIAGNOSIS — E860 Dehydration: Secondary | ICD-10-CM | POA: Diagnosis present

## 2024-05-31 DIAGNOSIS — Z1152 Encounter for screening for COVID-19: Secondary | ICD-10-CM | POA: Diagnosis not present

## 2024-05-31 DIAGNOSIS — I110 Hypertensive heart disease with heart failure: Secondary | ICD-10-CM | POA: Diagnosis present

## 2024-05-31 DIAGNOSIS — I69354 Hemiplegia and hemiparesis following cerebral infarction affecting left non-dominant side: Secondary | ICD-10-CM | POA: Diagnosis not present

## 2024-05-31 DIAGNOSIS — R54 Age-related physical debility: Secondary | ICD-10-CM | POA: Diagnosis present

## 2024-05-31 DIAGNOSIS — R627 Adult failure to thrive: Secondary | ICD-10-CM | POA: Diagnosis present

## 2024-05-31 DIAGNOSIS — J101 Influenza due to other identified influenza virus with other respiratory manifestations: Secondary | ICD-10-CM | POA: Diagnosis present

## 2024-05-31 LAB — BASIC METABOLIC PANEL WITH GFR
Anion gap: 10 (ref 5–15)
BUN: 17 mg/dL (ref 8–23)
CO2: 22 mmol/L (ref 22–32)
Calcium: 8.1 mg/dL — ABNORMAL LOW (ref 8.9–10.3)
Chloride: 104 mmol/L (ref 98–111)
Creatinine, Ser: 0.67 mg/dL (ref 0.61–1.24)
GFR, Estimated: >60 mL/min
Glucose, Bld: 95 mg/dL (ref 70–99)
Potassium: 3.8 mmol/L (ref 3.5–5.1)
Sodium: 136 mmol/L (ref 135–145)

## 2024-05-31 LAB — CBC
HCT: 34.2 % — ABNORMAL LOW (ref 39.0–52.0)
Hemoglobin: 11.1 g/dL — ABNORMAL LOW (ref 13.0–17.0)
MCH: 29.5 pg (ref 26.0–34.0)
MCHC: 32.5 g/dL (ref 30.0–36.0)
MCV: 91 fL (ref 80.0–100.0)
Platelets: 162 K/uL (ref 150–400)
RBC: 3.76 MIL/uL — ABNORMAL LOW (ref 4.22–5.81)
RDW: 14.1 % (ref 11.5–15.5)
WBC: 7.8 K/uL (ref 4.0–10.5)
nRBC: 0 % (ref 0.0–0.2)

## 2024-05-31 MED ORDER — BUDESONIDE 0.25 MG/2ML IN SUSP
0.2500 mg | Freq: Two times a day (BID) | RESPIRATORY_TRACT | Status: DC
  Administered 2024-05-31 – 2024-06-06 (×12): 0.25 mg via RESPIRATORY_TRACT
  Filled 2024-05-31 (×12): qty 2

## 2024-05-31 MED ORDER — ARFORMOTEROL TARTRATE 15 MCG/2ML IN NEBU
15.0000 ug | INHALATION_SOLUTION | Freq: Two times a day (BID) | RESPIRATORY_TRACT | Status: DC
  Administered 2024-05-31 – 2024-06-06 (×12): 15 ug via RESPIRATORY_TRACT
  Filled 2024-05-31 (×12): qty 2

## 2024-05-31 MED ORDER — ALBUTEROL SULFATE (2.5 MG/3ML) 0.083% IN NEBU
2.5000 mg | INHALATION_SOLUTION | RESPIRATORY_TRACT | Status: DC | PRN
  Administered 2024-05-31 – 2024-06-02 (×6): 2.5 mg via RESPIRATORY_TRACT
  Filled 2024-05-31 (×7): qty 3

## 2024-05-31 MED ORDER — PANTOPRAZOLE SODIUM 40 MG PO TBEC
40.0000 mg | DELAYED_RELEASE_TABLET | Freq: Two times a day (BID) | ORAL | Status: DC
  Administered 2024-05-31 – 2024-06-06 (×12): 40 mg via ORAL
  Filled 2024-05-31 (×12): qty 1

## 2024-05-31 MED ORDER — IPRATROPIUM-ALBUTEROL 0.5-2.5 (3) MG/3ML IN SOLN
3.0000 mL | Freq: Three times a day (TID) | RESPIRATORY_TRACT | Status: DC
  Administered 2024-06-01 – 2024-06-03 (×7): 3 mL via RESPIRATORY_TRACT
  Filled 2024-05-31 (×7): qty 3

## 2024-05-31 MED ORDER — GUAIFENESIN ER 600 MG PO TB12
600.0000 mg | ORAL_TABLET | Freq: Two times a day (BID) | ORAL | Status: DC
  Administered 2024-05-31 – 2024-06-01 (×3): 600 mg via ORAL
  Filled 2024-05-31 (×3): qty 1

## 2024-05-31 MED ORDER — METHYLPREDNISOLONE SODIUM SUCC 40 MG IJ SOLR
40.0000 mg | Freq: Every day | INTRAMUSCULAR | Status: DC
  Administered 2024-05-31 – 2024-06-04 (×5): 40 mg via INTRAVENOUS
  Filled 2024-05-31 (×5): qty 1

## 2024-05-31 MED ORDER — IPRATROPIUM-ALBUTEROL 0.5-2.5 (3) MG/3ML IN SOLN
3.0000 mL | Freq: Four times a day (QID) | RESPIRATORY_TRACT | Status: DC
  Administered 2024-05-31 (×2): 3 mL via RESPIRATORY_TRACT
  Filled 2024-05-31 (×2): qty 3

## 2024-05-31 NOTE — Evaluation (Addendum)
 Physical Therapy Evaluation Patient Details Name: Gregory Kerr MRN: [id number] DOB: February 28, 1941 Today's Date: 05/31/2024  History of Present Illness  84 y.o. male presents to [facility] 05/31/23 from [facility] with worsening confusion. Workup revealed FluA+, acute cystitis c/b acute urinary retention, compression fx of L3 and large hiatal hernia, conservative management. PMH: HTN, CVA with L spastic hemiplegia, CHF, COPD.   Clinical Impression  Pt from [facility] where he was needing assist for all mobility and ADLs. Pt reported having 1-2 person assist for stand-pivots to a WC. Pt was then able to propel the [facility] for a short distance with use of R UE/LE. Pt presents with hx of L spastic hemiplegia, increased tone/rigidity in L>R UE and LE, decreased activity tolerance and impaired balance/mobility. Pt likely close to baseline requiring TotalAx2 for all mobility. Question if pt is able to stand at baseline due to extension contracture in R knee and significant deformity/contractures in LLE. Pt declined transfer with TotalAx2 required to laterally scoot towards the [facility] with use of bed pad. Would recommend staff use hoyer lift to transfer OOB for safety. Deferring PT needs to [facility] with recommendation for <3hrs post acute rehab. Acute PT signing off. Please re-consult if there is a change in status.       If plan is discharge home, recommend the following: A lot of help with walking and/or transfers;A lot of help with bathing/dressing/bathroom;Assist for transportation;Help with stairs or ramp for entrance;Assistance with feeding;Assistance with cooking/housework   Can travel by private vehicle   No    Equipment Recommendations None recommended by PT     Functional Status Assessment Patient has not had a recent decline in their functional status     Precautions / Restrictions Precautions Precautions: Fall Recall of Precautions/Restrictions: Intact Precaution/Restrictions Comments:  chronic L3 compression fx, L spastic hemiplegia at baseline Restrictions Weight Bearing Restrictions Per Provider Order: No      Mobility  Bed Mobility Overal bed mobility: Needs Assistance Bed Mobility: Rolling, Supine to Sit, Sit to Supine Rolling: Total assist, +2 for physical assistance, +2 for safety/equipment   Supine to sit: Total assist, +2 for physical assistance, +2 for safety/equipment Sit to supine: Total assist, +2 for physical assistance, +2 for safety/equipment   General bed mobility comments: TotalAx2 for all aspects    Transfers Overall transfer level: Needs assistance Equipment used: None Transfers: Bed to chair/wheelchair/BSC     Lateral/Scoot Transfers: Total assist, +2 physical assistance, +2 safety/equipment General transfer comment: TotalAx2 for lateral scoots towards HOB with use of bed pad    Ambulation/Gait  General Gait Details: unable at baseline    Balance Overall balance assessment: Needs assistance, Mild deficits observed, not formally tested Sitting-balance support: No upper extremity supported, Feet supported Sitting balance-Leahy Scale: Zero Sitting balance - Comments: MaxA to maintain seated balance due to posterior lean. Unable to correct        Pertinent Vitals/Pain Pain Assessment Pain Assessment: Faces Faces Pain Scale: Hurts even more Pain Location: sacrum and low back with movement Pain Descriptors / Indicators: Crying, Discomfort, Grimacing Pain Intervention(s): Monitored during session, Limited activity within patient's tolerance, Repositioned    Home Living Family/patient expects to be discharged to:: Skilled nursing facility    Additional Comments: [name]    Prior Function Prior Level of Function : Needs assist    Mobility Comments: Assist for all mobility, stand-pivots to [facility] with +1 to +2 assist. Propels WC with R arm and leg ADLs Comments: Assist for  bathing and dressing. Able to self feed with meals in the  room     Extremity/Trunk Assessment   Upper Extremity Assessment Upper Extremity Assessment: Defer to OT evaluation    Lower Extremity Assessment Lower Extremity Assessment: LLE deficits/detail;RLE deficits/detail RLE Deficits / Details: Hip flexion 2-/5, Knee ext 2-/5 with knee flexion contracture, Ankle DF 2-/5 with rigidity  LLE Deficits / Details: hx of spastic hemiplegia, L foot positioned in IR and PF. Bony mass on lateral dorsum of foot (baseline). No active movement    Cervical / Trunk Assessment Cervical / Trunk Assessment: Other exceptions Cervical / Trunk Exceptions: chronic L3 compression fx  Communication   Communication Communication: No apparent difficulties    Cognition Arousal: Alert Behavior During Therapy: WFL for tasks assessed/performed   PT - Cognitive impairments: No apparent impairments    Following commands: Intact       Cueing Cueing Techniques: Verbal cues         PT Assessment All further PT needs can be met in the next venue of care  PT Problem List Decreased strength;Decreased range of motion;Decreased activity tolerance;Decreased balance;Decreased mobility;Decreased coordination;Impaired tone           PT Goals (Current goals can be found in the Care Plan section)  Acute Rehab PT Goals PT Goal Formulation: All assessment and education complete, DC therapy         Co-evaluation   Reason for Co-Treatment: For patient/therapist safety;To address functional/ADL transfers PT goals addressed during session: Mobility/safety with mobility;Balance;Proper use of DME         AM-PAC PT 6 Clicks Mobility  Outcome Measure Help needed turning from your back to your side while in a flat bed without using bedrails?: Total Help needed moving from lying on your back to sitting on the side of a flat bed without using bedrails?: Total Help needed moving to and from a bed to a chair (including a wheelchair)?: Total Help needed standing up  from a chair using your arms (e.g., wheelchair or bedside chair)?: Total Help needed to walk in hospital room?: Total Help needed climbing 3-5 steps with a railing? : Total 6 Click Score: 6    End of Session   Activity Tolerance: Patient tolerated treatment well Patient left: in bed;with call bell/phone within reach;with bed alarm set Nurse Communication: Mobility status;Need for lift equipment PT Visit Diagnosis: Other abnormalities of gait and mobility (R26.89);Unsteadiness on feet (R26.81);Muscle weakness (generalized) (M62.81);Hemiplegia and hemiparesis Hemiplegia - Right/Left: Left Hemiplegia - dominant/non-dominant: Non-dominant Hemiplegia - caused by: Cerebral infarction (history of)    Time: 9099-9074 PT Time Calculation (min) (ACUTE ONLY): 25 min   Charges:   PT Evaluation $PT Eval Moderate Complexity: 1 Mod   PT General Charges $$ ACUTE PT VISIT: 1 Visit       Kate ORN, PT, DPT Secure Chat Preferred  Rehab Office [phone]   Kate BRAVO Wendolyn 05/31/2024, 10:51 AM

## 2024-05-31 NOTE — Care Management Obs Status (Signed)
 MEDICARE OBSERVATION STATUS NOTIFICATION   Patient Details  Name: Gregory Kerr MRN: [id number] Date of Birth: Jul 01, 1940   Medicare Observation Status Notification Given:  Yes    Cheril Slattery G., RN 05/31/2024, 9:01 AM

## 2024-05-31 NOTE — Evaluation (Signed)
 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   Pneumonia    once or twice (09/22/2014)   Retinal detachment    left eye   Stroke (HCC) 2007   left side doesn't work now (09/22/2014)   Past Surgical History:  Past Surgical History:  Procedure Laterality Date   APPENDECTOMY  05/27/2009   Jeoffrey Dawn, MD   CATARACT EXTRACTION W/ INTRAOCULAR LENS  IMPLANT, BILATERAL Bilateral ?2014   CHOLECYSTECTOMY N/A 09/26/2014   Procedure: LAPAROSCOPIC CHOLECYSTECTOMY WITH INTRAOPERATIVE CHOLANGIOGRAM;  Surgeon: Donnice Lima, MD;  Location: [facility] OR;  Service: General;  Laterality: N/A;   COLONOSCOPY WITH PROPOFOL  N/A 12/29/2015   Procedure: COLONOSCOPY WITH PROPOFOL ;  Surgeon: Jerrell Sol, MD;  Location: [facility] ENDOSCOPY;  Service: Endoscopy;  Laterality: N/A;   CYSTOSCOPY W/ URETERAL STENT PLACEMENT Bilateral 05/23/2024   Procedure: CYSTOSCOPY WITH LEFT RETROGRADE PYELOGRAM AND BILATARAL URETERAL STENT INSERTION;  Surgeon: Elisabeth Valli BIRCH, MD;  Location: [facility] OR;  Service: Urology;  Laterality: Bilateral;   ERCP N/A 09/25/2014   Procedure: ENDOSCOPIC RETROGRADE CHOLANGIOPANCREATOGRAPHY (ERCP);  Surgeon: Oliva Boots, MD;  Location: [facility] ENDOSCOPY;  Service: Endoscopy;  Laterality: N/A;   IR CATHETER TUBE CHANGE  05/24/2021   PARS PLANA VITRECTOMY Left 01/30/2019   Procedure: PARS PLANA VITRECTOMY WITH 25 GAUGE;  Surgeon: Jarold Mayo, MD;  Location:  [facility] OR;  Service: Ophthalmology;  Laterality: Left;   PARS PLANA VITRECTOMY Right 11/13/2019   Procedure: PARS PLANA VITRECTOMY WITH 25 GAUGE WITH REMOVAL OF POSTERIOR CAPSULAR OPACIFICATION WITH VITRECTOR;  Surgeon: Jarold Mayo, MD;  Location: [facility] OR;  Service: Ophthalmology;  Laterality: Right;   PHOTOCOAGULATION WITH LASER Left 01/30/2019   Procedure: PHOTOCOAGULATION WITH LASER;  Surgeon: Jarold Mayo, MD;  Location: [facility] OR;  Service: Ophthalmology;  Laterality: Left;   HPI:  84 y.o. male presents to [facility] 05/31/23 from [facility] with worsening confusion. Workup revealed FluA+, acute cystitis c/b acute urinary retention, compression fx of L3 and large hiatal hernia, conservative management. Two prior  BSE in 2023. no needs. PMH: HTN, CVA with L spastic hemiplegia, CHF, COPD.    Assessment / Plan / Recommendation  Clinical Impression   Pt presented with no overt s/s of aspiration. Appropriate to continue with reg/thin diet with general safe swallow precautions and assistance with feeding. SLP to continue to f/u for diet tolerance and compensatory strategies given observed multiple swallows and COPD dx.  OME was generally unremarkable outside of multiple swallows palpated for volitional swallow. Pt has a productive cough outside of PO intake, with nursing staff noting concern for poor clearance given weak cough. He appeared to be out of breath but this is his baseline. SLP to f/u once more to ensure that  diet is tolerated well and provide education for COPD friendly swallow strategies.  SLP Visit Diagnosis: Dysphagia, unspecified (R13.10)    Aspiration Risk  Mild aspiration risk    Diet Recommendation    Reg/Thin liquids  Other Recommendations Oral Care Recommendations: Oral care BID     Swallow Evaluation Recommendations Recommendations: PO diet PO Diet Recommendation: Regular;Thin liquids (Level 0) Liquid Administration via: Straw Medication Administration:  (pt  preference) Supervision: Full assist for feeding Swallowing strategies  : Slow rate;Small bites/sips;Minimize environmental distractions;Multiple dry swallows after each bite/sip Postural changes: Position pt fully upright for meals;Stay upright 30-60 min after meals Oral care recommendations: Oral care BID (2x/day)   Assistance Recommended at Discharge    Functional Status Assessment Patient has had a recent decline in their functional status and demonstrates the ability to make significant improvements in function in a reasonable and predictable amount of time.  Frequency and Duration min 1 x/week  1 week       Prognosis Prognosis for improved oropharyngeal function: Good      Swallow Study   General Date of Onset: 05/30/24 HPI: 84 y.o. male [facility]23 from [facility] with worsening confusion. Workup revealed FluA+, acute cystitis c/b acute urinary retention, compression fx of L3 and large hiatal hernia, conservative management. Two prior  BSE in 2023. no needs. PMH: HTN, CVA with L spastic hemiplegia, CHF, COPD. Type of Study: Bedside Swallow Evaluation Previous Swallow Assessment: BSE in 09/23 and 05/23 Diet Prior to this Study: Regular;Thin liquids (Level 0) Respiratory Status: Room air History of Recent Intubation: No Behavior/Cognition: Cooperative;Pleasant mood Oral Cavity Assessment: Within Functional Limits Oral Cavity - Dentition: Missing dentition Self-Feeding Abilities: Total assist Patient Positioning: Upright in bed Baseline Vocal Quality: Hoarse Volitional Cough: Congested Volitional Swallow: Able to elicit    Oral/Motor/Sensory Function Overall Oral Motor/Sensory Function: Within functional limits   Ice Chips     Thin Liquid Thin Liquid: Within functional limits    Nectar Thick     Honey Thick     Puree Puree: Within functional limits   Solid     Solid: Within functional limits     Manuelita Blew M.S. CCC-SLP

## 2024-05-31 NOTE — TOC Initial Note (Signed)
 Transition of Care [facility]) - Initial/Assessment Note    Patient Details  Name: Gregory Kerr MRN: [id number] Date of Birth: [birth year]  [facility]) CM/SW Contact:    Gwenn Julien [place], [place] Phone Number: 05/31/2024, 5:20 PM  Clinical Narrative: Pt admitted from [facility] where he is a LTC/SNF resident. Anticipate return to SNF pending medical clearance. Notified Darrian in [facility] admissions.   Julien Gwenn, MSW, LCSW [phone] (coverage)                     Expected Discharge Plan: Long Term Nursing Home Barriers to Discharge: Continued Medical Work up   Patient Goals and CMS Choice            Expected Discharge Plan and Services     Post Acute Care Choice: Skilled Nursing Facility Living arrangements for the past 2 months: Skilled Nursing Facility                                      Prior Living Arrangements/Services Living arrangements for the past 2 months: Skilled Nursing Facility Lives with:: Facility Resident Patient language and need for interpreter reviewed:: Yes        Need for Family Participation in Patient Care: Yes (Comment) Care giver support system in place?: Yes (comment)   Criminal Activity/Legal Involvement Pertinent to Current Situation/Hospitalization: No - Comment as needed  Activities of Daily Living      Permission Sought/Granted                  Emotional Assessment       Orientation: : Oriented to Self, Fluctuating Orientation (Suspected and/or reported Sundowners) Alcohol  / Substance Use: Not Applicable Psych Involvement: No (comment)  Admission diagnosis:  Dehydration [E86.0] Influenza A [J10.1] SIRS (systemic inflammatory response syndrome) (HCC) [R65.10] Acute cystitis with hematuria [N30.01] Encephalopathy due to influenza A virus [J09.X9] Patient Active Problem List   Diagnosis Date Noted   Influenza A 05/30/2024   Multiple drug resistant organism (MDRO) culture  positive 05/27/2024   Sepsis secondary to UTI (HCC) 05/23/2024   Urolithiasis 05/23/2024   AKI (acute kidney injury) 05/23/2024   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   BPH (benign prostatic hyperplasia) 05/23/2024   AAA (abdominal aortic aneurysm) 05/23/2024   Closed compression fracture of L3 vertebra (HCC) 05/23/2024   SIRS (systemic inflammatory response syndrome) (HCC) 02/11/2022   Bacteremia    Acute renal failure superimposed on stage 3a chronic kidney disease (HCC) 06/29/2021   Bladder spasm 06/29/2021   Sepsis (HCC) 06/28/2021   Spasticity 06/28/2021   Prolonged QT interval 06/28/2021   Constipation 06/28/2021   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 03/08/2021   Generalized weakness    Acute CVA (cerebrovascular accident) (HCC) 11/19/2020   Allergic rhinitis due to allergen 04/27/2016   Chest pain 02/27/2016   Thoracic ascending aortic aneurysm 02/27/2016   Leukocytosis 02/27/2016   Major depression, chronic 02/16/2016   Neuropathic pain 02/16/2016   Diarrhea 12/29/2015   Coronary artery disease involving native coronary artery of native heart with angina pectoris 11/12/2015   History of CVA with residual deficit 10/14/2015   Hyperlipidemia 07/16/2015   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) 12/10/2014   Angina pectoris 12/10/2014   Chronic obstructive pulmonary disease (HCC) 12/10/2014   Irritable bowel syndrome with diarrhea 12/10/2014   Senile osteoporosis 12/10/2014   CVA (cerebral vascular  accident) (HCC) 12/10/2014   Acute on chronic diastolic CHF (congestive heart failure) (HCC) 09/27/2014   Hypokalemia 09/23/2014   Pancreatitis, gallstone 09/22/2014   Chronic diastolic heart failure (HCC) 09/22/2014   Acute cholangitis (HCC) 09/22/2014   Gallstone pancreatitis 09/22/2014   Hiatal hernia with GERD 08/31/2014   Vitamin D  deficiency 08/03/2014   Hyperlipidemia LDL goal <100 08/03/2014   CVA, old, hemiparesis (HCC) 08/03/2014   Leg edema,  left 08/03/2014   Gastroesophageal reflux disease without esophagitis 06/18/2014   Essential hypertension, benign 06/18/2014   Primary generalized hypertrophic osteoarthrosis 06/18/2014   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   UTI (urinary tract infection) 04/04/2014   Essential hypertension 04/03/2014   Other specified chronic obstructive pulmonary disease (HCC) 01/15/2014   Dermatophytosis of nail 12/18/2013   Essential hypertension 12/18/2013   Generalized osteoarthrosis, unspecified site 12/18/2013   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   GERD (gastroesophageal reflux disease) 08/11/2013   Disuse osteoporosis 08/11/2013   Chronic pain syndrome 01/30/2013   IBS (irritable bowel syndrome) 01/30/2013   Cerebral infarct (HCC) 10/24/2012   HTN (hypertension) 10/24/2012   Osteoporosis 10/24/2012   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   Other and unspecified hyperlipidemia 10/24/2012   Hiatal hernia 10/24/2012   PCP:  Donnella Agreste Pharmacy:   [facility] - [place], [place] - [address] [place] [address] Phone: [phone] Fax: [phone]     Social Drivers of Health (SDOH) Social History: SDOH Screenings   Food Insecurity: No Food Insecurity (05/23/2024)  Housing: Low Risk (05/25/2024)  Transportation Needs: No Transportation Needs (05/24/2024)  Utilities: Not At Risk (05/24/2024)  Social Connections: Socially Isolated (05/27/2024)  Tobacco Use: Medium Risk (05/23/2024)   SDOH Interventions:     Readmission Risk Interventions    02/15/2022   12:13 PM 10/13/2021   12:30 PM  Readmission Risk Prevention Plan  Transportation Screening Complete Complete  Medication Review Oceanographer) Complete Referral to Pharmacy  PCP or Specialist appointment within 3-5 days of discharge Complete Not Complete  PCP/Specialist Appt Not Complete comments  Still inpatient without discharge date at this time  HRI or Home Care Consult  Complete Not Complete  HRI or Home Care Consult Pt Refusal Comments  Patient is LTC at SNF  SW Recovery Care/Counseling Consult Complete Complete  Palliative Care Screening Complete Not Applicable  Skilled Nursing Facility Complete Complete

## 2024-05-31 NOTE — Evaluation (Signed)
 Occupational Therapy Evaluation Patient Details Name: Gregory Kerr MRN: [id number] DOB: April 21, 1941 Today's Date: 05/31/2024   History of Present Illness   84 y.o. male presents to [facility] 05/31/23 from [facility] with worsening confusion. Workup revealed FluA+, acute cystitis c/b acute urinary retention, compression fx of L3 and large hiatal hernia, conservative management. PMH: HTN, CVA with L spastic hemiplegia, CHF, COPD.     Clinical Impressions PTA, pt lived at [facility] and reports receiving assist with mobility and all ADL; pt able to self feed but inconsistently with set up per his report. Upon eval, pt presents likely close to baseline with decreased overall activity tolerance. Pt needing up to max A for UB ADL and total A for LB ADL. Pt reports he SPT to wheelchair with 1-2 assist at baseline, unsure accuracy of this report given extension contracture R knee and significant deformity/contractures in LLE. Will defer further needs to his home SNF [facility].      If plan is discharge home, recommend the following:   Two people to help with walking and/or transfers;Assistance with cooking/housework;Two people to help with bathing/dressing/bathroom;Help with stairs or ramp for entrance;Assist for transportation     Functional Status Assessment   Patient has had a recent decline in their functional status and demonstrates the ability to make significant improvements in function in a reasonable and predictable amount of time.     Equipment Recommendations   None recommended by OT     Recommendations for Other Services         Precautions/Restrictions   Precautions Precautions: Fall Recall of Precautions/Restrictions: Intact Precaution/Restrictions Comments: chronic L3 compression fx, L spastic hemiplegia at baseline Restrictions Weight Bearing Restrictions Per Provider Order: No     Mobility Bed Mobility Overal bed mobility: Needs Assistance Bed Mobility:  Rolling, Supine to Sit, Sit to Supine Rolling: Total assist, +2 for physical assistance, +2 for safety/equipment   Supine to sit: Total assist, +2 for physical assistance, +2 for safety/equipment Sit to supine: Total assist, +2 for physical assistance, +2 for safety/equipment   General bed mobility comments: TotalAx2 for all aspects    Transfers Overall transfer level: Needs assistance Equipment used: None Transfers: Bed to chair/wheelchair/BSC            Lateral/Scoot Transfers: Total assist, +2 physical assistance, +2 safety/equipment General transfer comment: TotalAx2 for lateral scoots towards HOB with use of bed pad boost      Balance Overall balance assessment: Needs assistance, Mild deficits observed, not formally tested Sitting-balance support: No upper extremity supported, Feet supported Sitting balance-Leahy Scale: Zero Sitting balance - Comments: MaxA to maintain seated balance due to posterior lean. Unable to correct                                   ADL either performed or assessed with clinical judgement   ADL Overall ADL's : Needs assistance/impaired Eating/Feeding: Moderate assistance;Bed level   Grooming: Set up;Bed level;Wash/dry face   Upper Body Bathing: Maximal assistance;Bed level   Lower Body Bathing: Total assistance;+2 for physical assistance;+2 for safety/equipment;Bed level   Upper Body Dressing : Maximal assistance;Bed level   Lower Body Dressing: Total assistance;+2 for physical assistance;+2 for safety/equipment;Bed level   Toilet Transfer: Total assistance;+2 for physical assistance;+2 for safety/equipment;Squat-pivot (vs lateral scoot)           Functional mobility during ADLs: Total assistance;+2 for physical assistance;+2 for safety/equipment  Vision Patient Visual Report: No change from baseline Additional Comments: not formally assessed due to pt denying changes     Perception         Praxis          Pertinent Vitals/Pain Pain Assessment Pain Assessment: Faces Faces Pain Scale: Hurts even more Pain Location: sacrum and low back with movement Pain Descriptors / Indicators: Crying, Discomfort, Grimacing Pain Intervention(s): Monitored during session, Limited activity within patient's tolerance     Extremity/Trunk Assessment Upper Extremity Assessment Upper Extremity Assessment: LUE deficits/detail;RUE deficits/detail RUE Deficits / Details: decreased shoulder AROM; able to reach all portions of face but not top of head, generally weak, lacks ~10 degrees elbow extension to extend to neutral LUE Deficits / Details: 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   Lower Extremity Assessment Lower Extremity Assessment: Defer to PT evaluation RLE Deficits / Details: Hip flexion 2-/5, Knee ext 2-/5 (pain with knee flexion) with rigidity, Ankle DF 2-/5 with rigidity LLE Deficits / Details: hx of spastic hemiplegia, L foot positioned in IR and PF. Bony mass on lateral dorsum of foot (baseline). No active movement   Cervical / Trunk Assessment Cervical / Trunk Assessment: Other exceptions Cervical / Trunk Exceptions: chronic L3 compression fx   Communication Communication Communication: No apparent difficulties   Cognition Arousal: Alert Behavior During Therapy: WFL for tasks assessed/performed Cognition: No family/caregiver present to determine baseline             OT - Cognition Comments: pleasant and conversational, oriented to location, person, place, situation. able to provide seemingly accurate history                 Following commands: Intact       Cueing  General Comments   Cueing Techniques: Verbal cues  VSS   Exercises     Shoulder Instructions      Home Living Family/patient expects to be discharged to:: Skilled nursing facility                                 Additional Comments: [name]      Prior  Functioning/Environment Prior Level of Function : Needs assist             Mobility Comments: Assist for all mobility, stand-pivots to [facility] with +1 to +2 assist. [employer] WC with R arm and leg ADLs Comments: Assist for bathing and dressing. Able to self feed with meals in the room    OT Problem List: Decreased strength;Decreased activity tolerance;Decreased range of motion;Impaired balance (sitting and/or standing);Decreased coordination;Decreased cognition;Decreased safety awareness;Decreased knowledge of use of DME or AE;Impaired UE functional use;Impaired tone   OT Treatment/Interventions:        OT Goals(Current goals can be found in the care plan section)   Acute Rehab OT Goals Patient Stated Goal: go home OT Goal Formulation: With patient Time For Goal Achievement: 06/14/24 Potential to Achieve Goals: Good   OT Frequency:       Co-evaluation PT/OT/SLP Co-Evaluation/Treatment: Yes Reason for Co-Treatment: For patient/therapist safety;To address functional/ADL transfers PT goals addressed during session: Mobility/safety with mobility;Balance;Proper use of DME OT goals addressed during session: ADL's and self-care      AM-PAC OT 6 Clicks Daily Activity     Outcome Measure Help from another person eating meals?: A Lot Help from another person taking care of personal grooming?: A Lot Help from another person toileting, which  includes using toliet, bedpan, or urinal?: Total Help from another person bathing (including washing, rinsing, drying)?: Total Help from another person to put on and taking off regular upper body clothing?: A Lot Help from another person to put on and taking off regular lower body clothing?: Total 6 Click Score: 9   End of Session Nurse Communication: Mobility status;Other (comment) (called NT for assist with self feeding)  Activity Tolerance: Patient tolerated treatment well Patient left: in bed;with call bell/phone within reach;with bed alarm  set  OT Visit Diagnosis: Unsteadiness on feet (R26.81);Muscle weakness (generalized) (M62.81);Other abnormalities of gait and mobility (R26.89);Feeding difficulties (R63.3);Hemiplegia and hemiparesis Hemiplegia - Right/Left: Left Hemiplegia - caused by:  (chronic from CVA)                Time: 9098-9075 OT Time Calculation (min): 23 min Charges:  OT General Charges $OT Visit: 1 Visit OT Evaluation $OT Eval Moderate Complexity: 1 Mod  Elma JONETTA Lebron FREDERICK, OTR/L [facility] Acute Rehabilitation Office: [phone]   Elma JONETTA Lebron 05/31/2024, 11:15 AM

## 2024-05-31 NOTE — Progress Notes (Addendum)
 " PROGRESS NOTE    ZEKIEL TORIAN  FMW:[id number] DOB: 07-16-1940 DOA: 05/30/2024 PCP: Donnella Agreste   Brief Narrative: 84 year old with past medical history significant for heart failure preserved ejection fraction, CVA with residual deficits left-sided hemiparesis, hypertension, hyperlipidemia, multidrug-resistant UTI, BPH, recent admission from 1/1 through 1/7 for sepsis secondary to Proteus and Enterococcus acute cystitis with acute bilateral retention status post bilateral JJ stent by urology on 1/2 discharged on ciprofloxacin  to complete 7-day course through 1/13 per ID.  Presented with worsening confusion, found to have failure to thrive and influenza A.   Assessment & Plan:   Principal Problem:   Influenza A  1-Sepsis secondary to Influenza A, POA Acute Bronchitis.  Failure to thrive -Patient presented with fever temperature 103, tachycardic heart rate 120, respiration rate 21, lactic acid 2.7. source of infection influenza.  -Continue with Tamiflu  -He is having BL expiratory wheezing. Plan to start Duoned, Pulmicort , brovana , and IV steroids. If no improvement could consider repeating chest x ray to see if he has develops pulmonary edema.    Acute metabolic encephalopathy: In setting influenza and fever.  He presented with AMS, lethargic, not answering questions  on admission.  He is more alert today, able to answer some questions.    Proteus Enterococcus acute cystitis with bilateral urinary retention, Pseudomonas bacteremia -Status post bilateral JJ stent per urology Dr. Elisabeth on 1/2 discharged on oral ciprofloxacin  750 mg 3 ID to complete last dose 1/13 -Needs to follow-up with urology for bilateral stent removal -Follow repeated  blood cultures and urine culture.  -Continue with Cipro   History of CVA with residual deficits/left-sided hemiparesis -Continue Plavix , -Continue baclofen , gabapentin  and Zoloft   Compression fracture -Patient was found to have L3 compression  fracture 50% height loss -Pain management.   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   Weight as of 05/22/24: 78.4 kg.   DVT prophylaxis: Lovenox  Code Status: DNR, intervention Family Communication: Cousin over phone.  Disposition Plan:  Status is: Observation The patient will require care spanning > 2 midnights and should be moved to inpatient because: Management of influenza, acute bronchitis sepsis    Consultants:  None  Procedures:  none  Antimicrobials:    Subjective: He is alert this morning, he is being fed, he is having bilateral wheezing, he has been coughing productive cough.  Objective: Vitals:   05/30/24 2030 05/30/24 2337 05/31/24 0100 05/31/24 0340  BP: (!) 143/76 (!) 185/70 (!) 153/77 130/61  Pulse: 79 88 87 77  Resp: 18 18 20 18   Temp: (!) 97.5 F (36.4 C) (!) 100.9 F (38.3 C) 98.6 F (37 C) 99.3 F (37.4 C)  TempSrc: Oral Oral Oral Oral  SpO2: 100% 100% 95% 92%    Intake/Output Summary (Last 24 hours) at 05/31/2024 0731 Last data filed at 05/31/2024 0535 Gross per 24 hour  Intake 120 ml  Output 300 ml  Net -180 ml   There were no vitals filed for this visit.  Examination:  General exam: Appears  calm and comfortable , frail Respiratory system: Bilateral wheezing. Respiratory effort normal. Cardiovascular system: S1 & S2 heard, RRR. Gastrointestinal system: Abdomen is nondistended, soft and nontender. No organomegaly or masses felt. Normal  bowel sounds heard. Central nervous system: Alert and oriented.  Extremities: Symmetric 5 x 5 power.   hands deformity   Data Reviewed: I have personally reviewed following labs and imaging studies  CBC: Recent Labs  Lab 05/25/24 0606 05/26/24 0426 05/27/24 0523 05/30/24 1033 05/30/24 1501 05/31/24 0103  WBC 12.1* 11.1* 8.6 10.9* 8.3 7.8  NEUTROABS 10.0* 8.7* 6.2 7.9*  --   --   HGB 9.8* 11.1* 12.7* 13.2 10.7* 11.1*  HCT 30.5* 33.9* 37.5* 42.2 33.7* 34.2*  MCV 93.8 91.4 88.9 93.6 93.4 91.0  PLT 88* 108* 123* 203 154 162   Basic Metabolic Panel: Recent Labs  Lab 05/25/24 0606 05/27/24 0523 05/30/24 1134 05/30/24 1501 05/31/24 0103  NA 141 139 141  --  136  K 3.3* 4.3 3.7  --  3.8  CL 115* 106 107  --  104  CO2 19* 25 22  --  22  GLUCOSE 89 102* 155*  --  95  BUN 25* 20 18  --  17  CREATININE 0.54* 0.62 0.85 0.73 0.67  CALCIUM  7.1* 9.0 8.7*  --  8.1*   GFR: Estimated Creatinine Clearance: 71.3 mL/min (by C-G formula based on SCr of 0.67 mg/dL). Liver Function Tests: Recent Labs  Lab 05/30/24 1134  AST 31  ALT 33  ALKPHOS 80  BILITOT 0.4  PROT 6.9  ALBUMIN 3.2*   No results for input(s): LIPASE, AMYLASE in the last 168 hours. No results for input(s): AMMONIA in the last 168 hours. Coagulation Profile: Recent Labs  Lab 05/30/24 1033  INR 1.1   Cardiac Enzymes: No results for input(s): CKTOTAL, CKMB, CKMBINDEX, TROPONINI in the last 168 hours. BNP (last 3 results) No results for input(s): PROBNP in the last 8760 hours. HbA1C: No results for input(s): HGBA1C in the last 72 hours. CBG: No results for input(s): GLUCAP in the last 168 hours. Lipid Profile: No results for input(s): CHOL, HDL, LDLCALC, TRIG, CHOLHDL, LDLDIRECT in the last 72 hours. Thyroid  Function Tests: No results for input(s): TSH, T4TOTAL, FREET4, T3FREE, THYROIDAB in the last 72 hours. Anemia Panel: No results for input(s): VITAMINB12,  FOLATE, FERRITIN, TIBC, IRON, RETICCTPCT in the last 72 hours. Sepsis Labs: Recent Labs  Lab 05/30/24 1047 05/30/24 1242 05/30/24 1443 05/30/24 1600  LATICACIDVEN 2.5* 2.7* 1.4 1.3    Recent Results (from the past 240 hours)  Blood culture (routine x 2)     Status: Abnormal   Collection Time: 05/23/24  3:02 AM   Specimen: BLOOD  Result Value Ref Range Status   Specimen Description BLOOD SITE NOT SPECIFIED  Final   Special Requests   Final    BOTTLES DRAWN AEROBIC AND ANAEROBIC Blood Culture results may not be optimal due to an inadequate volume of blood received in culture bottles   Culture  Setup Time   Final    GRAM NEGATIVE RODS AEROBIC BOTTLE ONLY CRITICAL RESULT CALLED TO, READ BACK BY AND VERIFIED WITH: PHARMD WANNARAT [id number] AT 1007 AM BY CM Performed at [facility], [address] [place], [place] [address]    Culture PSEUDOMONAS AERUGINOSA (A)  Final   Report Status 05/28/2024 FINAL  Final   Organism ID, Bacteria PSEUDOMONAS AERUGINOSA  Final      Susceptibility   Pseudomonas aeruginosa - MIC*  MEROPENEM 2 SENSITIVE Sensitive     CIPROFLOXACIN  0.5 SENSITIVE Sensitive     IMIPENEM 1 SENSITIVE Sensitive     CEFTAZIDIME/AVIBACTAM 4 SENSITIVE Sensitive     CEFTOLOZANE/TAZOBACTAM 1 SENSITIVE Sensitive     TOBRAMYCIN  <=1 SENSITIVE Sensitive     * PSEUDOMONAS AERUGINOSA  Blood Culture ID Panel (Reflexed)     Status: Abnormal   Collection Time: 05/23/24  3:02 AM  Result Value Ref Range Status   Enterococcus faecalis NOT DETECTED NOT DETECTED Final   Enterococcus Faecium NOT DETECTED NOT DETECTED Final   Listeria monocytogenes NOT DETECTED NOT DETECTED Final   Staphylococcus species NOT DETECTED NOT DETECTED Final   Staphylococcus aureus (BCID) NOT DETECTED NOT DETECTED Final   Staphylococcus epidermidis NOT DETECTED NOT DETECTED Final   Staphylococcus lugdunensis NOT DETECTED NOT DETECTED Final   Streptococcus species NOT DETECTED NOT DETECTED Final    Streptococcus agalactiae NOT DETECTED NOT DETECTED Final   Streptococcus pneumoniae NOT DETECTED NOT DETECTED Final   Streptococcus pyogenes NOT DETECTED NOT DETECTED Final   A.calcoaceticus-baumannii NOT DETECTED NOT DETECTED Final   Bacteroides fragilis NOT DETECTED NOT DETECTED Final   Enterobacterales NOT DETECTED NOT DETECTED Final   Enterobacter cloacae complex NOT DETECTED NOT DETECTED Final   Escherichia coli NOT DETECTED NOT DETECTED Final   Klebsiella aerogenes NOT DETECTED NOT DETECTED Final   Klebsiella oxytoca NOT DETECTED NOT DETECTED Final   Klebsiella pneumoniae NOT DETECTED NOT DETECTED Final   Proteus species NOT DETECTED NOT DETECTED Final   Salmonella species NOT DETECTED NOT DETECTED Final   Serratia marcescens NOT DETECTED NOT DETECTED Final   Haemophilus influenzae NOT DETECTED NOT DETECTED Final   Neisseria meningitidis NOT DETECTED NOT DETECTED Final   Pseudomonas aeruginosa DETECTED (A) NOT DETECTED Final    Comment: CRITICAL RESULT CALLED TO, READ BACK BY AND VERIFIED WITH: PHARMD EANNARAT [id number] AT 1007 AM  BY CM    Stenotrophomonas maltophilia NOT DETECTED NOT DETECTED Final   Candida albicans NOT DETECTED NOT DETECTED Final   Candida auris NOT DETECTED NOT DETECTED Final   Candida glabrata NOT DETECTED NOT DETECTED Final   Candida krusei NOT DETECTED NOT DETECTED Final   Candida parapsilosis NOT DETECTED NOT DETECTED Final   Candida tropicalis NOT DETECTED NOT DETECTED Final   Cryptococcus neoformans/gattii NOT DETECTED NOT DETECTED Final   CTX-M ESBL NOT DETECTED NOT DETECTED Final   Carbapenem resistance IMP NOT DETECTED NOT DETECTED Final   Carbapenem resistance KPC NOT DETECTED NOT DETECTED Final   Carbapenem resistance NDM NOT DETECTED NOT DETECTED Final   Carbapenem resistance VIM NOT DETECTED NOT DETECTED Final    Comment: Performed at [facility], [address] [place], [place] [address]  Urine Culture     Status: Abnormal    Collection Time: 05/23/24  4:28 AM   Specimen: Urine, Catheterized  Result Value Ref Range Status   Specimen Description URINE, CATHETERIZED  Final   Special Requests   Final    NONE Performed at [facility], [address] [place], [place] [address]    Culture (A)  Final    >=100,000 COLONIES/mL PROTEUS MIRABILIS >=100,000 COLONIES/mL ENTEROCOCCUS FAECALIS    Report Status 05/26/2024 FINAL  Final   Organism ID, Bacteria PROTEUS MIRABILIS (A)  Final   Organism ID, Bacteria ENTEROCOCCUS FAECALIS (A)  Final      Susceptibility   Enterococcus faecalis - MIC*    AMPICILLIN  <=2 SENSITIVE Sensitive     NITROFURANTOIN  <=16 SENSITIVE Sensitive  VANCOMYCIN  1 SENSITIVE Sensitive     * >=100,000 COLONIES/mL ENTEROCOCCUS FAECALIS   Proteus mirabilis - MIC*    AMPICILLIN  <=2 SENSITIVE Sensitive     CEFAZOLIN  (URINE) Value in next row Sensitive      2 SENSITIVEThis is a modified FDA-approved test that has been validated and its performance characteristics determined by the reporting laboratory.  This laboratory is certified under the Clinical Laboratory Improvement Amendments CLIA as qualified to perform high complexity clinical laboratory testing.    CEFEPIME  Value in next row Sensitive      2 SENSITIVEThis is a modified FDA-approved test that has been validated and its performance characteristics determined by the reporting laboratory.  This laboratory is certified under the Clinical Laboratory Improvement Amendments CLIA as qualified to perform high complexity clinical laboratory testing.    ERTAPENEM Value in next row Sensitive      2 SENSITIVEThis is a modified FDA-approved test that has been validated and its performance characteristics determined by the reporting laboratory.  This laboratory is certified under the Clinical Laboratory Improvement Amendments CLIA as qualified to perform high complexity clinical laboratory testing.    CEFTRIAXONE  Value in next row Sensitive      2  SENSITIVEThis is a modified FDA-approved test that has been validated and its performance characteristics determined by the reporting laboratory.  This laboratory is certified under the Clinical Laboratory Improvement Amendments CLIA as qualified to perform high complexity clinical laboratory testing.    CIPROFLOXACIN  Value in next row Resistant      2 SENSITIVEThis is a modified FDA-approved test that has been validated and its performance characteristics determined by the reporting laboratory.  This laboratory is certified under the Clinical Laboratory Improvement Amendments CLIA as qualified to perform high complexity clinical laboratory testing.    GENTAMICIN Value in next row Sensitive      2 SENSITIVEThis is a modified FDA-approved test that has been validated and its performance characteristics determined by the reporting laboratory.  This laboratory is certified under the Clinical Laboratory Improvement Amendments CLIA as qualified to perform high complexity clinical laboratory testing.    NITROFURANTOIN  Value in next row Resistant      2 SENSITIVEThis is a modified FDA-approved test that has been validated and its performance characteristics determined by the reporting laboratory.  This laboratory is certified under the Clinical Laboratory Improvement Amendments CLIA as qualified to perform high complexity clinical laboratory testing.    TRIMETH /SULFA  Value in next row Sensitive      2 SENSITIVEThis is a modified FDA-approved test that has been validated and its performance characteristics determined by the reporting laboratory.  This laboratory is certified under the Clinical Laboratory Improvement Amendments CLIA as qualified to perform high complexity clinical laboratory testing.    AMPICILLIN /SULBACTAM Value in next row Sensitive      2 SENSITIVEThis is a modified FDA-approved test that has been validated and its performance characteristics determined by the reporting laboratory.  This  laboratory is certified under the Clinical Laboratory Improvement Amendments CLIA as qualified to perform high complexity clinical laboratory testing.    PIP/TAZO Value in next row Sensitive      <=4 SENSITIVEThis is a modified FDA-approved test that has been validated and its performance characteristics determined by the reporting laboratory.  This laboratory is certified under the Clinical Laboratory Improvement Amendments CLIA as qualified to perform high complexity clinical laboratory testing.    MEROPENEM Value in next row Sensitive      <=4 SENSITIVEThis is a modified FDA-approved  test that has been validated and its performance characteristics determined by the reporting laboratory.  This laboratory is certified under the Clinical Laboratory Improvement Amendments CLIA as qualified to perform high complexity clinical laboratory testing.    * >=100,000 COLONIES/mL PROTEUS MIRABILIS  Blood culture (routine x 2)     Status: None   Collection Time: 05/23/24  4:19 PM   Specimen: BLOOD  Result Value Ref Range Status   Specimen Description BLOOD SITE NOT SPECIFIED  Final   Special Requests   Final    BOTTLES DRAWN AEROBIC AND ANAEROBIC Blood Culture results may not be optimal due to an inadequate volume of blood received in culture bottles   Culture   Final    NO GROWTH 5 DAYS Performed at [facility], [address] [place], [place] [address]    Report Status 05/28/2024 FINAL  Final  Resp panel by RT-PCR (RSV, Flu A&B, Covid) Anterior Nasal Swab     Status: Abnormal   Collection Time: 05/30/24 10:22 AM   Specimen: Anterior Nasal Swab  Result Value Ref Range Status   SARS Coronavirus 2 by RT PCR NEGATIVE NEGATIVE Final   Influenza A by PCR POSITIVE (A) NEGATIVE Final   Influenza B by PCR NEGATIVE NEGATIVE Final    Comment: (NOTE) The Xpert Xpress SARS-CoV-2/FLU/RSV plus assay is intended as an aid in the diagnosis of influenza from Nasopharyngeal swab specimens and should not be  used as a sole basis for treatment. Nasal washings and aspirates are unacceptable for Xpert Xpress SARS-CoV-2/FLU/RSV testing.  Fact Sheet for Patients: bloggercourse.com  Fact Sheet for Healthcare Providers: seriousbroker.it  This test is not yet approved or cleared by the [place]  FDA and has been authorized for detection and/or diagnosis of SARS-CoV-2 by FDA under an Emergency Use Authorization (EUA). This EUA will remain in effect (meaning this test can be used) for the duration of the COVID-19 declaration under Section 564(b)(1) of the Act, 21 U.S.C. section 360bbb-3(b)(1), unless the authorization is terminated or revoked.     Resp Syncytial Virus by PCR NEGATIVE NEGATIVE Final    Comment: (NOTE) Fact Sheet for Patients: bloggercourse.com  Fact Sheet for Healthcare Providers: seriousbroker.it  This test is not yet approved or cleared by the [place]  FDA and has been authorized for detection and/or diagnosis of SARS-CoV-2 by FDA under an Emergency Use Authorization (EUA). This EUA will remain in effect (meaning this test can be used) for the duration of the COVID-19 declaration under Section 564(b)(1) of the Act, 21 U.S.C. section 360bbb-3(b)(1), unless the authorization is terminated or revoked.  Performed at [facility], [address], [place], [place] [address]          Radiology Studies: CT Renal Stone Study Result Date: 05/30/2024 CLINICAL DATA:  Flank pain. EXAM: CT ABDOMEN AND PELVIS WITHOUT CONTRAST TECHNIQUE: Multidetector CT imaging of the abdomen and pelvis was performed following the standard protocol without IV contrast. RADIATION DOSE REDUCTION: This exam was performed according to the departmental dose-optimization program which includes automated exposure control, adjustment of the mA and/or kV according to patient size and/or use of  iterative reconstruction technique. COMPARISON:  May 22, 2024 FINDINGS: Lower chest: No acute abnormality. Hepatobiliary: No focal liver abnormality is seen. Status post cholecystectomy. No biliary dilatation. Pancreas: Diffuse pancreatic atrophy is seen. No pancreatic ductal dilatation or surrounding inflammatory changes. Spleen: Normal in size without focal abnormality. Adrenals/Urinary Tract: Adrenal glands are unremarkable. Kidneys are normal in size without focal lesions.  Properly positioned bilateral endo ureteral stents are seen. This represents a new finding when compared to the prior study. Numerous bilateral subcentimeter non-obstructing renal calculi are noted. A 3 mm ureteral calculus is seen within the mid left ureter (best seen on coronal reformatted image 50, CT series 6). A 2.6 cm urinary bladder calculus is seen within a poorly distended urinary bladder. Mild to moderate severity diffuse urinary bladder wall thickening is present with a mild amount of surrounding inflammatory fat stranding. Stomach/Bowel: There is a large, stable gastric hernia. The appendix is surgically absent. No evidence of bowel wall thickening, distention, or inflammatory changes. Vascular/Lymphatic: Aortic atherosclerosis. No enlarged abdominal or pelvic lymph nodes. Reproductive: There is moderate to marked severity prostate gland enlargement. Other: A stable 5.4 cm x 5.3 cm right inguinal hernia is seen. This contains fat and normal appearing loops of distal ileum. A stable 4.1 cm x 5.9 cm fat containing left inguinal hernia is noted. No abdominopelvic ascites. Musculoskeletal: A chronic compression fracture deformity is seen at the level of L3 with multilevel degenerative changes present throughout the lumbar spine. IMPRESSION: 1. Properly positioned bilateral endo ureteral stents. 2. 3 mm mid left ureteral calculus. 3. Numerous bilateral subcentimeter non-obstructing renal calculi. 4. 2.6 cm urinary bladder calculus.  5. Mild to moderate severity diffuse urinary bladder wall thickening with a mild amount of surrounding inflammatory fat stranding. Correlation with urinalysis is recommended to exclude acute cystitis. 6. Large, stable gastric hernia. 7. Stable bilateral inguinal hernias, as described above. 8. Chronic compression fracture deformity at the level of L3. 9. Aortic atherosclerosis. Electronically Signed   By: Suzen Dials M.D.   On: 05/30/2024 12:49   DG Finger Thumb Left Result Date: 05/30/2024 CLINICAL DATA:  Left thumb pain EXAM: LEFT THUMB 2+V COMPARISON:  None Available. FINDINGS: There is no evidence of fracture or dislocation. Minimal degenerative changes seen involving the first interphalangeal joint. Persistent flexion of this joint is noted as well. Soft tissues are unremarkable. IMPRESSION: No definite fracture or dislocation. Minimal degenerative change involving first interphalangeal joint with persistent flexion. Electronically Signed   By: Lynwood Landy Raddle M.D.   On: 05/30/2024 11:48   DG Chest Port 1 View Result Date: 05/30/2024 CLINICAL DATA:  Sepsis EXAM: PORTABLE CHEST 1 VIEW COMPARISON:  May 22, 2024 FINDINGS: Stable cardiomediastinal silhouette. Right lung is clear. Large left hiatal hernia is again noted. Minimal left basilar subsegmental atelectasis. Bony thorax is unremarkable. IMPRESSION: Large left hiatal hernia. Minimal left basilar subsegmental atelectasis. Electronically Signed   By: Lynwood Landy Raddle M.D.   On: 05/30/2024 11:46        Scheduled Meds:  atorvastatin   40 mg Oral QPM   baclofen   20 mg Oral TID   ciprofloxacin   750 mg Oral BID   clopidogrel   75 mg Oral Daily   enoxaparin  (LOVENOX ) injection  40 mg Subcutaneous Q24H   gabapentin   200 mg Oral TID   oseltamivir   75 mg Oral BID   pantoprazole   20 mg Oral Daily   sertraline   125 mg Oral Daily   tamsulosin   0.4 mg Oral QHS   trimethoprim   100 mg Oral QHS   Continuous Infusions:   LOS: 0 days    Time  spent: 35 Minutes    Clair Alfieri A Caya Soberanis, MD [facility]   If 7PM-7AM, please contact night-coverage [email or web address]  05/31/2024, 7:31 AM   "

## 2024-05-31 NOTE — Plan of Care (Signed)
" °  Problem: Health Behavior/Discharge Planning: Goal: Ability to manage health-related needs will improve Outcome: Progressing   Problem: Clinical Measurements: Goal: Will remain free from infection Outcome: Progressing   Problem: Clinical Measurements: Goal: Respiratory complications will improve Outcome: Progressing   Problem: Activity: Goal: Risk for activity intolerance will decrease Outcome: Progressing   Problem: Nutrition: Goal: Adequate nutrition will be maintained Outcome: Progressing   Problem: Coping: Goal: Level of anxiety will decrease Outcome: Progressing   Problem: Safety: Goal: Ability to remain free from injury will improve Outcome: Progressing   Problem: Skin Integrity: Goal: Risk for impaired skin integrity will decrease Outcome: Progressing   "

## 2024-06-01 DIAGNOSIS — J101 Influenza due to other identified influenza virus with other respiratory manifestations: Secondary | ICD-10-CM | POA: Diagnosis not present

## 2024-06-01 LAB — BASIC METABOLIC PANEL WITH GFR
Anion gap: 11 (ref 5–15)
BUN: 17 mg/dL (ref 8–23)
CO2: 21 mmol/L — ABNORMAL LOW (ref 22–32)
Calcium: 8.2 mg/dL — ABNORMAL LOW (ref 8.9–10.3)
Chloride: 105 mmol/L (ref 98–111)
Creatinine, Ser: 0.81 mg/dL (ref 0.61–1.24)
GFR, Estimated: >60 mL/min
Glucose, Bld: 165 mg/dL — ABNORMAL HIGH (ref 70–99)
Potassium: 3.9 mmol/L (ref 3.5–5.1)
Sodium: 137 mmol/L (ref 135–145)

## 2024-06-01 MED ORDER — GUAIFENESIN ER 600 MG PO TB12
600.0000 mg | ORAL_TABLET | Freq: Once | ORAL | Status: AC
  Administered 2024-06-01: 600 mg via ORAL
  Filled 2024-06-01: qty 1

## 2024-06-01 MED ORDER — GUAIFENESIN ER 600 MG PO TB12
1200.0000 mg | ORAL_TABLET | Freq: Two times a day (BID) | ORAL | Status: DC
  Administered 2024-06-01 – 2024-06-06 (×10): 1200 mg via ORAL
  Filled 2024-06-01 (×10): qty 2

## 2024-06-01 NOTE — Progress Notes (Signed)
 " PROGRESS NOTE    Gregory Kerr  FMW:[id number] DOB: Aug 30, 1940 DOA: 05/30/2024 PCP: Donnella Agreste   Brief Narrative: 84 year old with past medical history significant for heart failure preserved ejection fraction, CVA with residual deficits left-sided hemiparesis, hypertension, hyperlipidemia, multidrug-resistant UTI, BPH, recent admission from 1/1 through 1/7 for sepsis secondary to Proteus and Enterococcus acute cystitis with acute bilateral retention status post bilateral JJ stent by urology on 1/2 discharged on ciprofloxacin  to complete 7-day course through 1/13 per ID.  Presented with worsening confusion, found to have failure to thrive and influenza A.   Assessment & Plan:   Principal Problem:   Influenza A  1-Sepsis secondary to Influenza A, POA Acute Bronchitis.  Failure to thrive -Patient presented with fever temperature 103, tachycardic heart rate 120, respiration rate 21, lactic acid 2.7. source of infection influenza.  -Continue with Tamiflu  -Continue with  Duoned, Pulmicort , brovana , and IV steroids.  Less wheezing today. Still coughing a lot.  Continue with current management.   Acute metabolic encephalopathy: In setting influenza and fever.  He presented with AMS, lethargic, not answering questions  on admission.  He is more alert , able to answer  questions.  Improving.   Proteus Enterococcus acute cystitis with bilateral urinary retention, Pseudomonas bacteremia -Status post bilateral JJ stent per urology Dr. Elisabeth on 1/2 discharged on oral ciprofloxacin  750 mg 3 ID to complete last dose 1/13 -Needs to follow-up with urology for bilateral stent removal -Repeated  blood cultures and urine culture. No growth to date.  -Continue with Cipro   History of CVA with residual deficits/left-sided hemiparesis -Continue Plavix , -Continue baclofen , gabapentin  and Zoloft   Compression fracture -Patient was found to have L3 compression fracture 50% height loss -Pain  management.   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   Weight as of 05/22/24: 78.4 kg.   DVT prophylaxis: Lovenox  Code Status: DNR, intervention Family Communication: Cousin over phone 1/10.  Disposition Plan:  Status is: Observation The patient will require care spanning > 2 midnights and should be moved to inpatient because: Management of influenza, acute bronchitis sepsis    Consultants:  None  Procedures:  none  Antimicrobials:    Subjective: Alert, report not feeling well. Report cough productive, less wheezing.    Objective: Vitals:   06/01/24 0002 06/01/24 0308 06/01/24 0730 06/01/24 0733  BP: (!) 141/74 (!) 148/72 (!) 148/66   Pulse: 89 80 70   Resp: 17 20 18    Temp: 98.6 F (37 C) 98.6 F (37 C) 97.9 F (36.6 C)   TempSrc: Oral Oral Oral   SpO2: 95% 95% 96% 97%    Intake/Output Summary (Last 24 hours) at 06/01/2024 1024 Last data filed at 06/01/2024 0900 Gross per 24 hour  Intake 720 ml  Output 1100 ml  Net -380 ml   There were no vitals filed for this visit.  Examination:  General exam: Frail, NAD Respiratory system: BL ronchus, no wheezing. Normal resp effort Cardiovascular system: S 1, S 2 RRR  Gastrointestinal system: BS present, soft, nt Central nervous system: Alert Extremities: Symmetric 5 x 5 power.   hands deformity   Data Reviewed: I have personally reviewed following labs and imaging studies  CBC: Recent Labs  Lab  05/26/24 0426 05/27/24 0523 05/30/24 1033 05/30/24 1501 05/31/24 0103  WBC 11.1* 8.6 10.9* 8.3 7.8  NEUTROABS 8.7* 6.2 7.9*  --   --   HGB 11.1* 12.7* 13.2 10.7* 11.1*  HCT 33.9* 37.5* 42.2 33.7* 34.2*  MCV 91.4 88.9 93.6 93.4 91.0  PLT 108* 123* 203 154 162   Basic Metabolic Panel: Recent Labs  Lab 05/27/24 0523 05/30/24 1134 05/30/24 1501 05/31/24 0103  NA 139 141  --  136  K 4.3 3.7  --  3.8  CL 106 107  --  104  CO2 25 22  --  22  GLUCOSE 102* 155*  --  95  BUN 20 18  --  17  CREATININE 0.62 0.85 0.73 0.67  CALCIUM  9.0 8.7*  --  8.1*   GFR: Estimated Creatinine Clearance: 71.3 mL/min (by C-G formula based on SCr of 0.67 mg/dL). Liver Function Tests: Recent Labs  Lab 05/30/24 1134  AST 31  ALT 33  ALKPHOS 80  BILITOT 0.4  PROT 6.9  ALBUMIN 3.2*   No results for input(s): LIPASE, AMYLASE in the last 168 hours. No results for input(s): AMMONIA in the last 168 hours. Coagulation Profile: Recent Labs  Lab 05/30/24 1033  INR 1.1   Cardiac Enzymes: No results for input(s): CKTOTAL, CKMB, CKMBINDEX, TROPONINI in the last 168 hours. BNP (last 3 results) No results for input(s): PROBNP in the last 8760 hours. HbA1C: No results for input(s): HGBA1C in the last 72 hours. CBG: No results for input(s): GLUCAP in the last 168 hours. Lipid Profile: No results for input(s): CHOL, HDL, LDLCALC, TRIG, CHOLHDL, LDLDIRECT in the last 72 hours. Thyroid  Function Tests: No results for input(s): TSH, T4TOTAL, FREET4, T3FREE, THYROIDAB in the last 72 hours. Anemia Panel: No results for input(s): VITAMINB12, FOLATE, FERRITIN, TIBC, IRON, RETICCTPCT in the last 72 hours. Sepsis Labs: Recent Labs  Lab 05/30/24 1047 05/30/24 1242 05/30/24 1443 05/30/24 1600  LATICACIDVEN 2.5* 2.7* 1.4 1.3    Recent Results (from the past 240 hours)  Blood culture (routine x 2)     Status: Abnormal   Collection Time: 05/23/24  3:02  AM   Specimen: BLOOD  Result Value Ref Range Status   Specimen Description BLOOD SITE NOT SPECIFIED  Final   Special Requests   Final    BOTTLES DRAWN AEROBIC AND ANAEROBIC Blood Culture results may not be optimal due to an inadequate volume of blood received in culture bottles   Culture  Setup Time   Final    GRAM NEGATIVE RODS AEROBIC BOTTLE ONLY CRITICAL RESULT CALLED TO, READ BACK BY AND VERIFIED WITH: PHARMD WANNARAT [id number] AT 1007 AM BY CM Performed at [facility], [address] [place], [place] [address]    Culture PSEUDOMONAS AERUGINOSA (A)  Final   Report Status 05/28/2024 FINAL  Final   Organism ID, Bacteria PSEUDOMONAS AERUGINOSA  Final      Susceptibility   Pseudomonas aeruginosa - MIC*    MEROPENEM 2 SENSITIVE Sensitive     CIPROFLOXACIN  0.5 SENSITIVE Sensitive     IMIPENEM 1 SENSITIVE Sensitive     CEFTAZIDIME/AVIBACTAM 4 SENSITIVE Sensitive     CEFTOLOZANE/TAZOBACTAM 1 SENSITIVE Sensitive     TOBRAMYCIN  <=1 SENSITIVE Sensitive     *  PSEUDOMONAS AERUGINOSA  Blood Culture ID Panel (Reflexed)     Status: Abnormal   Collection Time: 05/23/24  3:02 AM  Result Value Ref Range Status   Enterococcus faecalis NOT DETECTED NOT DETECTED Final   Enterococcus Faecium NOT DETECTED NOT DETECTED Final   Listeria monocytogenes NOT DETECTED NOT DETECTED Final   Staphylococcus species NOT DETECTED NOT DETECTED Final   Staphylococcus aureus (BCID) NOT DETECTED NOT DETECTED Final   Staphylococcus epidermidis NOT DETECTED NOT DETECTED Final   Staphylococcus lugdunensis NOT DETECTED NOT DETECTED Final   Streptococcus species NOT DETECTED NOT DETECTED Final   Streptococcus agalactiae NOT DETECTED NOT DETECTED Final   Streptococcus pneumoniae NOT DETECTED NOT DETECTED Final   Streptococcus pyogenes NOT DETECTED NOT DETECTED Final   A.calcoaceticus-baumannii NOT DETECTED NOT DETECTED Final   Bacteroides fragilis NOT DETECTED NOT DETECTED Final   Enterobacterales NOT DETECTED NOT  DETECTED Final   Enterobacter cloacae complex NOT DETECTED NOT DETECTED Final   Escherichia coli NOT DETECTED NOT DETECTED Final   Klebsiella aerogenes NOT DETECTED NOT DETECTED Final   Klebsiella oxytoca NOT DETECTED NOT DETECTED Final   Klebsiella pneumoniae NOT DETECTED NOT DETECTED Final   Proteus species NOT DETECTED NOT DETECTED Final   Salmonella species NOT DETECTED NOT DETECTED Final   Serratia marcescens NOT DETECTED NOT DETECTED Final   Haemophilus influenzae NOT DETECTED NOT DETECTED Final   Neisseria meningitidis NOT DETECTED NOT DETECTED Final   Pseudomonas aeruginosa DETECTED (A) NOT DETECTED Final    Comment: CRITICAL RESULT CALLED TO, READ BACK BY AND VERIFIED WITH: PHARMD EANNARAT [id number] AT 1007 AM  BY CM    Stenotrophomonas maltophilia NOT DETECTED NOT DETECTED Final   Candida albicans NOT DETECTED NOT DETECTED Final   Candida auris NOT DETECTED NOT DETECTED Final   Candida glabrata NOT DETECTED NOT DETECTED Final   Candida krusei NOT DETECTED NOT DETECTED Final   Candida parapsilosis NOT DETECTED NOT DETECTED Final   Candida tropicalis NOT DETECTED NOT DETECTED Final   Cryptococcus neoformans/gattii NOT DETECTED NOT DETECTED Final   CTX-M ESBL NOT DETECTED NOT DETECTED Final   Carbapenem resistance IMP NOT DETECTED NOT DETECTED Final   Carbapenem resistance KPC NOT DETECTED NOT DETECTED Final   Carbapenem resistance NDM NOT DETECTED NOT DETECTED Final   Carbapenem resistance VIM NOT DETECTED NOT DETECTED Final    Comment: Performed at [facility], [address], [place], [place] [address]  Urine Culture     Status: Abnormal   Collection Time: 05/23/24  4:28 AM   Specimen: Urine, Catheterized  Result Value Ref Range Status   Specimen Description URINE, CATHETERIZED  Final   Special Requests   Final    NONE Performed at [facility], [address], [place], [place] [address]    Culture (A)  Final    >=100,000 COLONIES/mL PROTEUS  MIRABILIS >=100,000 COLONIES/mL ENTEROCOCCUS FAECALIS    Report Status 05/26/2024 FINAL  Final   Organism ID, Bacteria PROTEUS MIRABILIS (A)  Final   Organism ID, Bacteria ENTEROCOCCUS FAECALIS (A)  Final      Susceptibility   Enterococcus faecalis - MIC*    AMPICILLIN  <=2 SENSITIVE Sensitive     NITROFURANTOIN  <=16 SENSITIVE Sensitive     VANCOMYCIN  1 SENSITIVE Sensitive     * >=100,000 COLONIES/mL ENTEROCOCCUS FAECALIS   Proteus mirabilis - MIC*    AMPICILLIN  <=2 SENSITIVE Sensitive     CEFAZOLIN  (URINE) Value in next row Sensitive      2 SENSITIVEThis is  a modified FDA-approved test that has been validated and its performance characteristics determined by the reporting laboratory.  This laboratory is certified under the Clinical Laboratory Improvement Amendments CLIA as qualified to perform high complexity clinical laboratory testing.    CEFEPIME  Value in next row Sensitive      2 SENSITIVEThis is a modified FDA-approved test that has been validated and its performance characteristics determined by the reporting laboratory.  This laboratory is certified under the Clinical Laboratory Improvement Amendments CLIA as qualified to perform high complexity clinical laboratory testing.    ERTAPENEM Value in next row Sensitive      2 SENSITIVEThis is a modified FDA-approved test that has been validated and its performance characteristics determined by the reporting laboratory.  This laboratory is certified under the Clinical Laboratory Improvement Amendments CLIA as qualified to perform high complexity clinical laboratory testing.    CEFTRIAXONE  Value in next row Sensitive      2 SENSITIVEThis is a modified FDA-approved test that has been validated and its performance characteristics determined by the reporting laboratory.  This laboratory is certified under the Clinical Laboratory Improvement Amendments CLIA as qualified to perform high complexity clinical laboratory testing.    CIPROFLOXACIN  Value  in next row Resistant      2 SENSITIVEThis is a modified FDA-approved test that has been validated and its performance characteristics determined by the reporting laboratory.  This laboratory is certified under the Clinical Laboratory Improvement Amendments CLIA as qualified to perform high complexity clinical laboratory testing.    GENTAMICIN Value in next row Sensitive      2 SENSITIVEThis is a modified FDA-approved test that has been validated and its performance characteristics determined by the reporting laboratory.  This laboratory is certified under the Clinical Laboratory Improvement Amendments CLIA as qualified to perform high complexity clinical laboratory testing.    NITROFURANTOIN  Value in next row Resistant      2 SENSITIVEThis is a modified FDA-approved test that has been validated and its performance characteristics determined by the reporting laboratory.  This laboratory is certified under the Clinical Laboratory Improvement Amendments CLIA as qualified to perform high complexity clinical laboratory testing.    TRIMETH /SULFA  Value in next row Sensitive      2 SENSITIVEThis is a modified FDA-approved test that has been validated and its performance characteristics determined by the reporting laboratory.  This laboratory is certified under the Clinical Laboratory Improvement Amendments CLIA as qualified to perform high complexity clinical laboratory testing.    AMPICILLIN /SULBACTAM Value in next row Sensitive      2 SENSITIVEThis is a modified FDA-approved test that has been validated and its performance characteristics determined by the reporting laboratory.  This laboratory is certified under the Clinical Laboratory Improvement Amendments CLIA as qualified to perform high complexity clinical laboratory testing.    PIP/TAZO Value in next row Sensitive      <=4 SENSITIVEThis is a modified FDA-approved test that has been validated and its performance characteristics determined by the  reporting laboratory.  This laboratory is certified under the Clinical Laboratory Improvement Amendments CLIA as qualified to perform high complexity clinical laboratory testing.    MEROPENEM Value in next row Sensitive      <=4 SENSITIVEThis is a modified FDA-approved test that has been validated and its performance characteristics determined by the reporting laboratory.  This laboratory is certified under the Clinical Laboratory Improvement Amendments CLIA as qualified to perform high complexity clinical laboratory testing.    * >=100,000 COLONIES/mL PROTEUS MIRABILIS  Blood  culture (routine x 2)     Status: None   Collection Time: 05/23/24  4:19 PM   Specimen: BLOOD  Result Value Ref Range Status   Specimen Description BLOOD SITE NOT SPECIFIED  Final   Special Requests   Final    BOTTLES DRAWN AEROBIC AND ANAEROBIC Blood Culture results may not be optimal due to an inadequate volume of blood received in culture bottles   Culture   Final    NO GROWTH 5 DAYS Performed at [facility], [address], [place], [place] [address]    Report Status 05/28/2024 FINAL  Final  Resp panel by RT-PCR (RSV, Flu A&B, Covid) Anterior Nasal Swab     Status: Abnormal   Collection Time: 05/30/24 10:22 AM   Specimen: Anterior Nasal Swab  Result Value Ref Range Status   SARS Coronavirus 2 by RT PCR NEGATIVE NEGATIVE Final   Influenza A by PCR POSITIVE (A) NEGATIVE Final   Influenza B by PCR NEGATIVE NEGATIVE Final    Comment: (NOTE) The Xpert Xpress SARS-CoV-2/FLU/RSV plus assay is intended as an aid in the diagnosis of influenza from Nasopharyngeal swab specimens and should not be used as a sole basis for treatment. Nasal washings and aspirates are unacceptable for Xpert Xpress SARS-CoV-2/FLU/RSV testing.  Fact Sheet for Patients: bloggercourse.com  Fact Sheet for Healthcare Providers: seriousbroker.it  This test is not yet approved or cleared  by the [place]  FDA and has been authorized for detection and/or diagnosis of SARS-CoV-2 by FDA under an Emergency Use Authorization (EUA). This EUA will remain in effect (meaning this test can be used) for the duration of the COVID-19 declaration under Section 564(b)(1) of the Act, 21 U.S.C. section 360bbb-3(b)(1), unless the authorization is terminated or revoked.     Resp Syncytial Virus by PCR NEGATIVE NEGATIVE Final    Comment: (NOTE) Fact Sheet for Patients: bloggercourse.com  Fact Sheet for Healthcare Providers: seriousbroker.it  This test is not yet approved or cleared by the [place]  FDA and has been authorized for detection and/or diagnosis of SARS-CoV-2 by FDA under an Emergency Use Authorization (EUA). This EUA will remain in effect (meaning this test can be used) for the duration of the COVID-19 declaration under Section 564(b)(1) of the Act, 21 U.S.C. section 360bbb-3(b)(1), unless the authorization is terminated or revoked.  Performed at [facility], [address] [place], [place] [address]   Blood Culture (routine x 2)     Status: None (Preliminary result)   Collection Time: 05/30/24 10:33 AM   Specimen: BLOOD RIGHT HAND  Result Value Ref Range Status   Specimen Description BLOOD RIGHT HAND  Final   Special Requests   Final    AEROBIC BOTTLE ONLY Blood Culture results may not be optimal due to an inadequate volume of blood received in culture bottles   Culture   Final    NO GROWTH 2 DAYS Performed at [facility], [address] [place], [place] [address]    Report Status PENDING  Incomplete  Blood Culture (routine x 2)     Status: None (Preliminary result)   Collection Time: 05/30/24 10:49 AM   Specimen: BLOOD RIGHT ARM  Result Value Ref Range Status   Specimen Description BLOOD RIGHT ARM  Final   Special Requests   Final    AEROBIC BOTTLE ONLY Blood Culture results may not be  optimal due to an inadequate volume of blood received in culture bottles   Culture  Final    NO GROWTH 2 DAYS Performed at [facility], [address] [place], [place] [address]    Report Status PENDING  Incomplete         Radiology Studies: CT Renal Stone Study Result Date: 05/30/2024 CLINICAL DATA:  Flank pain. EXAM: CT ABDOMEN AND PELVIS WITHOUT CONTRAST TECHNIQUE: Multidetector CT imaging of the abdomen and pelvis was performed following the standard protocol without IV contrast. RADIATION DOSE REDUCTION: This exam was performed according to the departmental dose-optimization program which includes automated exposure control, adjustment of the mA and/or kV according to patient size and/or use of iterative reconstruction technique. COMPARISON:  May 22, 2024 FINDINGS: Lower chest: No acute abnormality. Hepatobiliary: No focal liver abnormality is seen. Status post cholecystectomy. No biliary dilatation. Pancreas: Diffuse pancreatic atrophy is seen. No pancreatic ductal dilatation or surrounding inflammatory changes. Spleen: Normal in size without focal abnormality. Adrenals/Urinary Tract: Adrenal glands are unremarkable. Kidneys are normal in size without focal lesions. Properly positioned bilateral endo ureteral stents are seen. This represents a new finding when compared to the prior study. Numerous bilateral subcentimeter non-obstructing renal calculi are noted. A 3 mm ureteral calculus is seen within the mid left ureter (best seen on coronal reformatted image 50, CT series 6). A 2.6 cm urinary bladder calculus is seen within a poorly distended urinary bladder. Mild to moderate severity diffuse urinary bladder wall thickening is present with a mild amount of surrounding inflammatory fat stranding. Stomach/Bowel: There is a large, stable gastric hernia. The appendix is surgically absent. No evidence of bowel wall thickening, distention, or inflammatory changes. Vascular/Lymphatic: Aortic  atherosclerosis. No enlarged abdominal or pelvic lymph nodes. Reproductive: There is moderate to marked severity prostate gland enlargement. Other: A stable 5.4 cm x 5.3 cm right inguinal hernia is seen. This contains fat and normal appearing loops of distal ileum. A stable 4.1 cm x 5.9 cm fat containing left inguinal hernia is noted. No abdominopelvic ascites. Musculoskeletal: A chronic compression fracture deformity is seen at the level of L3 with multilevel degenerative changes present throughout the lumbar spine. IMPRESSION: 1. Properly positioned bilateral endo ureteral stents. 2. 3 mm mid left ureteral calculus. 3. Numerous bilateral subcentimeter non-obstructing renal calculi. 4. 2.6 cm urinary bladder calculus. 5. Mild to moderate severity diffuse urinary bladder wall thickening with a mild amount of surrounding inflammatory fat stranding. Correlation with urinalysis is recommended to exclude acute cystitis. 6. Large, stable gastric hernia. 7. Stable bilateral inguinal hernias, as described above. 8. Chronic compression fracture deformity at the level of L3. 9. Aortic atherosclerosis. Electronically Signed   By: Suzen Dials M.D.   On: 05/30/2024 12:49   DG Finger Thumb Left Result Date: 05/30/2024 CLINICAL DATA:  Left thumb pain EXAM: LEFT THUMB 2+V COMPARISON:  None Available. FINDINGS: There is no evidence of fracture or dislocation. Minimal degenerative changes seen involving the first interphalangeal joint. Persistent flexion of this joint is noted as well. Soft tissues are unremarkable. IMPRESSION: No definite fracture or dislocation. Minimal degenerative change involving first interphalangeal joint with persistent flexion. Electronically Signed   By: Lynwood Landy Raddle M.D.   On: 05/30/2024 11:48   DG Chest Port 1 View Result Date: 05/30/2024 CLINICAL DATA:  Sepsis EXAM: PORTABLE CHEST 1 VIEW COMPARISON:  May 22, 2024 FINDINGS: Stable cardiomediastinal silhouette. Right lung is clear. Large  left hiatal hernia is again noted. Minimal left basilar subsegmental atelectasis. Bony thorax is unremarkable. IMPRESSION: Large left hiatal hernia. Minimal left basilar subsegmental atelectasis. Electronically Signed  By: Lynwood Landy Raddle M.D.   On: 05/30/2024 11:46        Scheduled Meds:  arformoterol   15 mcg Nebulization BID   atorvastatin   40 mg Oral QPM   baclofen   20 mg Oral TID   budesonide  (PULMICORT ) nebulizer solution  0.25 mg Nebulization BID   ciprofloxacin   750 mg Oral BID   clopidogrel   75 mg Oral Daily   enoxaparin  (LOVENOX ) injection  40 mg Subcutaneous Q24H   gabapentin   200 mg Oral TID   guaiFENesin   600 mg Oral BID   ipratropium-albuterol   3 mL Nebulization TID   methylPREDNISolone  (SOLU-MEDROL ) injection  40 mg Intravenous Daily   oseltamivir   75 mg Oral BID   pantoprazole   40 mg Oral BID   sertraline   125 mg Oral Daily   tamsulosin   0.4 mg Oral QHS   trimethoprim   100 mg Oral QHS   Continuous Infusions:   LOS: 1 day    Time spent: 35 Minutes    Zyasia Halbleib A Kamesha Herne, MD [facility]   If 7PM-7AM, please contact night-coverage [email or web address]  06/01/2024, 10:24 AM   "

## 2024-06-02 ENCOUNTER — Inpatient Hospital Stay (HOSPITAL_COMMUNITY)

## 2024-06-02 DIAGNOSIS — J101 Influenza due to other identified influenza virus with other respiratory manifestations: Secondary | ICD-10-CM | POA: Diagnosis not present

## 2024-06-02 MED ORDER — SODIUM CHLORIDE 3 % IN NEBU
4.0000 mL | INHALATION_SOLUTION | Freq: Every day | RESPIRATORY_TRACT | Status: AC
  Administered 2024-06-02 – 2024-06-04 (×3): 4 mL via RESPIRATORY_TRACT
  Filled 2024-06-02 (×3): qty 4

## 2024-06-02 MED ORDER — SODIUM CHLORIDE 0.9 % IV SOLN
3.0000 g | Freq: Four times a day (QID) | INTRAVENOUS | Status: DC
  Administered 2024-06-02 – 2024-06-04 (×7): 3 g via INTRAVENOUS
  Filled 2024-06-02 (×7): qty 8

## 2024-06-02 MED ORDER — MELATONIN 3 MG PO TABS
3.0000 mg | ORAL_TABLET | Freq: Every evening | ORAL | Status: DC | PRN
  Administered 2024-06-04: 3 mg via ORAL
  Filled 2024-06-02: qty 1

## 2024-06-02 MED ORDER — GERHARDT'S BUTT CREAM
TOPICAL_CREAM | Freq: Two times a day (BID) | CUTANEOUS | Status: DC
  Filled 2024-06-02: qty 60

## 2024-06-02 NOTE — TOC Initial Note (Signed)
 Transition of Care [facility]) - Initial/Assessment Note    Patient Details  Name: Gregory Kerr MRN: [id number] Date of Birth: Oct 22, 1940  Transition of Care [facility]) CM/SW Contact:    Luann SHAUNNA Cumming, LCSW Phone Number: 06/02/2024, 1:02 PM  Clinical Narrative:                  CSW initially informed that pt could return when ready though later informed that pt has a roommate and could not admit until Saturday as their precaution policy for flu is 7 days. They may be able to admit sooner if a single room opens up.   Expected Discharge Plan: Long Term Nursing Home Barriers to Discharge: Continued Medical Work up   Expected Discharge Plan and Services     Post Acute Care Choice: Skilled Nursing Facility Living arrangements for the past 2 months: Skilled Nursing Facility                                      Prior Living Arrangements/Services Living arrangements for the past 2 months: Skilled Nursing Facility Lives with:: Facility Resident Patient language and need for interpreter reviewed:: Yes        Need for Family Participation in Patient Care: Yes (Comment) Care giver support system in place?: Yes (comment)   Criminal Activity/Legal Involvement Pertinent to Current Situation/Hospitalization: No - Comment as needed  Activities of Daily Living      Permission Sought/Granted                  Emotional Assessment       Orientation: : Oriented to Self, Fluctuating Orientation (Suspected and/or reported Sundowners) Alcohol  / Substance Use: Not Applicable Psych Involvement: No (comment)  Admission diagnosis:  Dehydration [E86.0] Influenza A [J10.1] SIRS (systemic inflammatory response syndrome) (HCC) [R65.10] Acute cystitis with hematuria [N30.01] Encephalopathy due to influenza A virus [J09.X9] Patient Active Problem List   Diagnosis Date Noted   Influenza A 05/30/2024   Multiple drug resistant organism (MDRO) culture positive 05/27/2024   Sepsis secondary  to UTI (HCC) 05/23/2024   Urolithiasis 05/23/2024   AKI (acute kidney injury) 05/23/2024   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   BPH (benign prostatic hyperplasia) 05/23/2024   AAA (abdominal aortic aneurysm) 05/23/2024   Closed compression fracture of L3 vertebra (HCC) 05/23/2024   SIRS (systemic inflammatory response syndrome) (HCC) 02/11/2022   Bacteremia    Acute renal failure superimposed on stage 3a chronic kidney disease (HCC) 06/29/2021   Bladder spasm 06/29/2021   Sepsis (HCC) 06/28/2021   Spasticity 06/28/2021   Prolonged QT interval 06/28/2021   Constipation 06/28/2021   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 03/08/2021   Generalized weakness    Acute CVA (cerebrovascular accident) (HCC) 11/19/2020   Allergic rhinitis due to allergen 04/27/2016   Chest pain 02/27/2016   Thoracic ascending aortic aneurysm 02/27/2016   Leukocytosis 02/27/2016   Major depression, chronic 02/16/2016   Neuropathic pain 02/16/2016   Diarrhea 12/29/2015   Coronary artery disease involving native coronary artery of native heart with angina pectoris 11/12/2015   History of CVA with residual deficit 10/14/2015   Hyperlipidemia 07/16/2015   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) 12/10/2014   Angina pectoris 12/10/2014   Chronic obstructive pulmonary disease (HCC) 12/10/2014   Irritable bowel syndrome with diarrhea 12/10/2014   Senile osteoporosis 12/10/2014   CVA (cerebral vascular accident) (HCC) 12/10/2014  Acute on chronic diastolic CHF (congestive heart failure) (HCC) 09/27/2014   Hypokalemia 09/23/2014   Pancreatitis, gallstone 09/22/2014   Chronic diastolic heart failure (HCC) 09/22/2014   Acute cholangitis (HCC) 09/22/2014   Gallstone pancreatitis 09/22/2014   Hiatal hernia with GERD 08/31/2014   Vitamin D  deficiency 08/03/2014   Hyperlipidemia LDL goal <100 08/03/2014   CVA, old, hemiparesis (HCC) 08/03/2014   Leg edema, left 08/03/2014   Gastroesophageal  reflux disease without esophagitis 06/18/2014   Essential hypertension, benign 06/18/2014   Primary generalized hypertrophic osteoarthrosis 06/18/2014   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   UTI (urinary tract infection) 04/04/2014   Essential hypertension 04/03/2014   Other specified chronic obstructive pulmonary disease (HCC) 01/15/2014   Dermatophytosis of nail 12/18/2013   Essential hypertension 12/18/2013   Generalized osteoarthrosis, unspecified site 12/18/2013   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   GERD (gastroesophageal reflux disease) 08/11/2013   Disuse osteoporosis 08/11/2013   Chronic pain syndrome 01/30/2013   IBS (irritable bowel syndrome) 01/30/2013   Cerebral infarct (HCC) 10/24/2012   HTN (hypertension) 10/24/2012   Osteoporosis 10/24/2012   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   Other and unspecified hyperlipidemia 10/24/2012   Hiatal hernia 10/24/2012   PCP:  Donnella Agreste Pharmacy:   [facility] [place] [place] - [address] [place] [address] Phone: [phone] Fax: [phone]     Social Drivers of Health (SDOH) Social History: SDOH Screenings   Food Insecurity: No Food Insecurity (05/31/2024)  Housing: Low Risk (05/31/2024)  Transportation Needs: No Transportation Needs (05/31/2024)  Utilities: Not At Risk (05/31/2024)  Social Connections: Socially Isolated (05/31/2024)  Tobacco Use: Medium Risk (05/23/2024)   SDOH Interventions: Food Insecurity Interventions: Intervention Not Indicated Housing Interventions: Intervention Not Indicated Transportation Interventions: Intervention Not Indicated Utilities Interventions: Intervention Not Indicated Social Connections Interventions: Inpatient TOC   Readmission Risk Interventions    02/15/2022   12:13 PM 10/13/2021   12:30 PM  Readmission Risk Prevention Plan  Transportation Screening Complete Complete  Medication Review Oceanographer)  Complete Referral to Pharmacy  PCP or Specialist appointment within 3-5 days of discharge Complete Not Complete  PCP/Specialist Appt Not Complete comments  Still inpatient without discharge date at this time  HRI or Home Care Consult Complete Not Complete  HRI or Home Care Consult Pt Refusal Comments  Patient is LTC at SNF  SW Recovery Care/Counseling Consult Complete Complete  Palliative Care Screening Complete Not Applicable  Skilled Nursing Facility Complete Complete

## 2024-06-02 NOTE — Progress Notes (Signed)
 " PROGRESS NOTE    Gregory Kerr  FMW:[id number] DOB: [birth year] DOA: 05/30/2024 PCP: Donnella Agreste   Brief Narrative: 84 year old with past medical history significant for heart failure preserved ejection fraction, CVA with residual deficits left-sided hemiparesis, hypertension, hyperlipidemia, multidrug-resistant UTI, BPH, recent admission from 1/1 through 1/7 for sepsis secondary to Proteus and Enterococcus acute cystitis with acute bilateral retention status post bilateral JJ stent by urology on 1/2 discharged on ciprofloxacin  to complete 7-day course through 1/13 per ID.  Presented with worsening confusion, found to have failure to thrive and influenza A.   Assessment & Plan:   Principal Problem:   Influenza A  1-Sepsis secondary to Influenza A, POA Acute Bronchitis.  Failure to thrive -Patient presented with fever temperature 103, tachycardic heart rate 120, respiration rate 21, lactic acid 2.7. source of infection influenza.  -Continue with Tamiflu  -Continue with  Duoned, Pulmicort , brovana , and IV steroids.  Continue with current management.  Patient is having more secretions, unable to cough up phlegm.  Plan for Vest PT, added hypertonic saline. Might need suction.   Acute metabolic encephalopathy: In setting influenza and fever.  He presented with AMS, lethargic, not answering questions  on admission.  He is more alert , able to answer  questions.  Improving.   Proteus Enterococcus acute cystitis with bilateral urinary retention, Pseudomonas bacteremia -Status post bilateral JJ stent per urology Dr. Elisabeth on 1/2 discharged on oral ciprofloxacin  750 mg 3 ID to complete last dose 1/13 -Needs to follow-up with urology for bilateral stent removal -Repeated  blood cultures and urine culture. No growth to date.  -Continue with Cipro   History of CVA with residual deficits/left-sided hemiparesis -Continue Plavix , -Continue baclofen , gabapentin  and Zoloft   Compression  fracture -Patient was found to have L3 compression fracture 50% height loss -Pain management.   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   Weight as of 05/22/24: 78.4 kg.   DVT prophylaxis: Lovenox  Code Status: DNR, intervention Family Communication: Cousin over phone 1/10.  Disposition Plan:  Status is: Observation The patient will require care spanning > 2 midnights and should be moved to inpatient because: Management of influenza, acute bronchitis sepsis    Consultants:  None  Procedures:  none  Antimicrobials:    Subjective: He is not feeling better today.  Report worsening cough, and secretions.   Objective: Vitals:   06/01/24 2320 06/02/24 0358 06/02/24 0718 06/02/24 0724  BP: (!) 160/80 (!) 161/68 138/64   Pulse: 85 73 73 76  Resp: 17 18 16 18   Temp: 98.9 F (37.2 C) 98.2 F (36.8 C) 98.6 F (37 C)   TempSrc: Oral Oral Oral   SpO2: 95% 96% 95% 95%    Intake/Output Summary (Last 24 hours) at 06/02/2024 0931 Last data filed at 06/02/2024 0834 Gross per 24 hour  Intake 840 ml  Output 1650 ml  Net -810 ml   There were no vitals filed for this visit.  Examination:  General exam: Frail, mild distress Respiratory  system: tachypnea, BL ronchus.  Cardiovascular system: S 1, S2 RRR Gastrointestinal system: BS present, soft, nt Extremities: Symmetric 5 x 5 power.   hands deformity   Data Reviewed: I have personally reviewed following labs and imaging  studies  CBC: Recent Labs  Lab 05/27/24 0523 05/30/24 1033 05/30/24 1501 05/31/24 0103  WBC 8.6 10.9* 8.3 7.8  NEUTROABS 6.2 7.9*  --   --   HGB 12.7* 13.2 10.7* 11.1*  HCT 37.5* 42.2 33.7* 34.2*  MCV 88.9 93.6 93.4 91.0  PLT 123* 203 154 162   Basic Metabolic Panel: Recent Labs  Lab 05/27/24 0523 05/30/24 1134 05/30/24 1501 05/31/24 0103 06/01/24 1238  NA 139 141  --  136 137  K 4.3 3.7  --  3.8 3.9  CL 106 107  --  104 105  CO2 25 22  --  22 21*  GLUCOSE 102* 155*  --  95 165*  BUN 20 18  --  17 17  CREATININE 0.62 0.85 0.73 0.67 0.81  CALCIUM  9.0 8.7*  --  8.1* 8.2*   GFR: Estimated Creatinine Clearance: 70.4 mL/min (by C-G formula based on SCr of 0.81 mg/dL). Liver Function Tests: Recent Labs  Lab 05/30/24 1134  AST 31  ALT 33  ALKPHOS 80  BILITOT 0.4  PROT 6.9  ALBUMIN 3.2*   No results for input(s): LIPASE, AMYLASE in the last 168 hours. No results for input(s): AMMONIA in the last 168 hours. Coagulation Profile: Recent Labs  Lab 05/30/24 1033  INR 1.1   Cardiac Enzymes: No results for input(s): CKTOTAL, CKMB, CKMBINDEX, TROPONINI in the last 168 hours. BNP (last 3 results) No results for input(s): PROBNP in the last 8760 hours. HbA1C: No results for input(s): HGBA1C in the last 72 hours. CBG: No results for input(s): GLUCAP in the last 168 hours. Lipid Profile: No results for input(s): CHOL, HDL, LDLCALC, TRIG, CHOLHDL, LDLDIRECT in the last 72 hours. Thyroid  Function Tests: No results for input(s): TSH, T4TOTAL, FREET4, T3FREE, THYROIDAB in the last 72 hours. Anemia Panel: No results for input(s): VITAMINB12, FOLATE, FERRITIN, TIBC, IRON, RETICCTPCT in the last 72 hours. Sepsis Labs: Recent Labs  Lab 05/30/24 1047 05/30/24 1242 05/30/24 1443 05/30/24 1600  LATICACIDVEN 2.5* 2.7* 1.4 1.3    Recent Results (from the past 240 hours)  Blood culture (routine x 2)     Status: None    Collection Time: 05/23/24  4:19 PM   Specimen: BLOOD  Result Value Ref Range Status   Specimen Description BLOOD SITE NOT SPECIFIED  Final   Special Requests   Final    BOTTLES DRAWN AEROBIC AND ANAEROBIC Blood Culture results may not be optimal due to an inadequate volume of blood received in culture bottles   Culture   Final    NO GROWTH 5 DAYS Performed at [facility], [address], [place], [place] [address]    Report Status 05/28/2024 FINAL  Final  Resp panel by RT-PCR (RSV, Flu A&B, Covid) Anterior Nasal Swab     Status: Abnormal   Collection Time: 05/30/24 10:22 AM   Specimen: Anterior Nasal Swab  Result Value Ref Range Status   SARS Coronavirus 2 by RT PCR NEGATIVE NEGATIVE Final   Influenza A by PCR POSITIVE (A) NEGATIVE Final   Influenza B by PCR NEGATIVE NEGATIVE Final    Comment: (NOTE) The Xpert Xpress SARS-CoV-2/FLU/RSV plus assay is intended as an aid in the diagnosis of influenza from Nasopharyngeal swab specimens and should not  be used as a sole basis for treatment. Nasal washings and aspirates are unacceptable for Xpert Xpress SARS-CoV-2/FLU/RSV testing.  Fact Sheet for Patients: bloggercourse.com  Fact Sheet for Healthcare Providers: seriousbroker.it  This test is not yet approved or cleared by the [place]  FDA and has been authorized for detection and/or diagnosis of SARS-CoV-2 by FDA under an Emergency Use Authorization (EUA). This EUA will remain in effect (meaning this test can be used) for the duration of the COVID-19 declaration under Section 564(b)(1) of the Act, 21 U.S.C. section 360bbb-3(b)(1), unless the authorization is terminated or revoked.     Resp Syncytial Virus by PCR NEGATIVE NEGATIVE Final    Comment: (NOTE) Fact Sheet for Patients: bloggercourse.com  Fact Sheet for Healthcare Providers: seriousbroker.it  This test is not  yet approved or cleared by the [place]  FDA and has been authorized for detection and/or diagnosis of SARS-CoV-2 by FDA under an Emergency Use Authorization (EUA). This EUA will remain in effect (meaning this test can be used) for the duration of the COVID-19 declaration under Section 564(b)(1) of the Act, 21 U.S.C. section 360bbb-3(b)(1), unless the authorization is terminated or revoked.  Performed at [facility], [address], [place], [place] [address]   Blood Culture (routine x 2)     Status: None (Preliminary result)   Collection Time: 05/30/24 10:33 AM   Specimen: BLOOD RIGHT HAND  Result Value Ref Range Status   Specimen Description BLOOD RIGHT HAND  Final   Special Requests   Final    AEROBIC BOTTLE ONLY Blood Culture results may not be optimal due to an inadequate volume of blood received in culture bottles   Culture   Final    NO GROWTH 3 DAYS Performed at [facility], [address], [place], [place] [address]    Report Status PENDING  Incomplete  Blood Culture (routine x 2)     Status: None (Preliminary result)   Collection Time: 05/30/24 10:49 AM   Specimen: BLOOD RIGHT ARM  Result Value Ref Range Status   Specimen Description BLOOD RIGHT ARM  Final   Special Requests   Final    AEROBIC BOTTLE ONLY Blood Culture results may not be optimal due to an inadequate volume of blood received in culture bottles   Culture   Final    NO GROWTH 3 DAYS Performed at [facility], [address] [place], [place] [address]    Report Status PENDING  Incomplete         Radiology Studies: No results found.       Scheduled Meds:  arformoterol   15 mcg Nebulization BID   atorvastatin   40 mg Oral QPM   baclofen   20 mg Oral TID   budesonide  (PULMICORT ) nebulizer solution  0.25 mg Nebulization BID   ciprofloxacin   750 mg Oral BID   clopidogrel   75 mg Oral Daily   enoxaparin  (LOVENOX ) injection  40 mg Subcutaneous Q24H   gabapentin   200 mg Oral  TID   guaiFENesin   1,200 mg Oral BID   ipratropium-albuterol   3 mL Nebulization TID   methylPREDNISolone  (SOLU-MEDROL ) injection  40 mg Intravenous Daily   oseltamivir   75 mg Oral BID   pantoprazole   40 mg Oral BID   sertraline   125 mg Oral Daily   sodium chloride  HYPERTONIC  4 mL Nebulization Daily   tamsulosin   0.4 mg Oral QHS   trimethoprim   100 mg Oral QHS   Continuous Infusions:   LOS:  2 days    Time spent: 35 Minutes    Georgio Hattabaugh DELENA Lore, MD [facility]   If 7PM-7AM, please contact night-coverage [email or web address]  06/02/2024, 9:31 AM   "

## 2024-06-03 DIAGNOSIS — J101 Influenza due to other identified influenza virus with other respiratory manifestations: Secondary | ICD-10-CM | POA: Diagnosis not present

## 2024-06-03 NOTE — Progress Notes (Signed)
 " PROGRESS NOTE    Gregory Kerr  FMW:[id number] DOB: [birth year] DOA: 05/30/2024 PCP: Donnella Agreste   Brief Narrative: 84 year old with past medical history significant for heart failure preserved ejection fraction, CVA with residual deficits left-sided hemiparesis, hypertension, hyperlipidemia, multidrug-resistant UTI, BPH, recent admission from 1/1 through 1/7 for sepsis secondary to Proteus and Enterococcus acute cystitis with acute bilateral retention status post bilateral JJ stent by urology on 1/2 discharged on ciprofloxacin  to complete 7-day course through 1/13 per ID.  Presented with worsening confusion, found to have failure to thrive and influenza A.   Assessment & Plan:   Principal Problem:   Influenza A  1-Sepsis secondary to Influenza A, POA Acute Bronchitis.  Failure to thrive -Patient presented with fever temperature 103, tachycardic heart rate 120, respiration rate 21, lactic acid 2.7. source of infection influenza.  -Continue with Tamiflu  -Continue with  Duoned, Pulmicort , brovana , and IV steroids.  -Continue with current management.  -Continue with r Vest PT, added hypertonic saline. Might need suction.  -1/12: Develops worsening dyspnea, respiratory distress. Chest x ray showed left Lower lobe atelectasis. Started on IV Unasyn  to cover for aspiration.  -He is breathing better today compare to yesterday, but not at baseline. Continue with current management.   Acute metabolic encephalopathy: In setting influenza and fever.  He presented with AMS, lethargic, not answering questions  on admission.  He is more alert , able to answer  questions.  Improved.   Proteus Enterococcus acute cystitis with bilateral urinary retention, Pseudomonas bacteremia -Status post bilateral JJ stent per urology Dr. Elisabeth on 1/2 discharged on oral ciprofloxacin  750 mg 3 ID to complete last dose 1/13 -Needs to follow-up with urology for bilateral stent removal -Repeated  blood cultures and  urine culture. No growth to date.    History of CVA with residual deficits/left-sided hemiparesis -Continue Plavix , -Continue baclofen , gabapentin  and Zoloft   Compression fracture -Patient was found to have L3 compression fracture 50% height loss -Pain management.   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   Weight as of 05/22/24: 78.4 kg.   DVT prophylaxis: Lovenox  Code Status: DNR, intervention Family Communication: Cousin over phone 1/13 Disposition Plan:  Status is: Observation The patient will require care spanning > 2 midnights and should be moved to inpatient because: Management of influenza, acute bronchitis sepsis    Consultants:  None  Procedures:  none  Antimicrobials:    Subjective: He is feeling better than yesterday today, still with cough, difficult  coughing up phlegm.   Objective: Vitals:   06/03/24 0728 06/03/24 0805 06/03/24 1111 06/03/24 1200  BP: (!) 167/78  (!) 156/70   Pulse: 73 73 76 76  Resp: 16 16 16 16   Temp: 98.6 F (37 C)  98.3 F (36.8 C)   TempSrc: Axillary  Axillary   SpO2: 94% 94% 93% 93%    Intake/Output Summary (Last 24 hours) at 06/03/2024 1230 Last data filed at 06/03/2024 0858 Gross per 24 hour  Intake 580 ml  Output 1650 ml  Net -1070 ml   There were no vitals filed for this visit.  Examination:  General exam: frail;.  Respiratory system: Less use accessory muscle to breath. BL ronchus.  Cardiovascular system:  S 1, S 2 RRR Gastrointestinal system: BS present, soft, nt Extremities: Symmetric 5 x 5 power.   hands deformity   Data Reviewed: I have personally reviewed following labs and imaging studies  CBC: Recent Labs  Lab 05/30/24 1033 05/30/24 1501 05/31/24 0103  WBC 10.9* 8.3 7.8  NEUTROABS 7.9*  --   --   HGB 13.2 10.7* 11.1*  HCT 42.2 33.7* 34.2*  MCV 93.6 93.4 91.0  PLT 203 154 162   Basic Metabolic Panel: Recent Labs  Lab 05/30/24 1134 05/30/24 1501 05/31/24 0103 06/01/24 1238  NA 141  --  136 137  K 3.7  --  3.8 3.9  CL 107  --  104 105  CO2 22  --  22 21*  GLUCOSE 155*  --  95 165*  BUN 18  --  17 17  CREATININE 0.85 0.73 0.67 0.81  CALCIUM  8.7*  --  8.1* 8.2*   GFR: Estimated Creatinine Clearance: 70.4 mL/min (by C-G formula based on SCr of 0.81 mg/dL). Liver Function Tests: Recent Labs  Lab 05/30/24 1134  AST 31  ALT 33  ALKPHOS 80  BILITOT 0.4  PROT 6.9  ALBUMIN 3.2*   No results for input(s): LIPASE, AMYLASE in the last 168 hours. No results for input(s): AMMONIA in the last 168 hours. Coagulation Profile: Recent Labs  Lab 05/30/24 1033  INR 1.1   Cardiac Enzymes: No results for input(s): CKTOTAL, CKMB, CKMBINDEX, TROPONINI in the last 168 hours. BNP (last 3 results) No results for input(s): PROBNP in the last 8760 hours. HbA1C: No results for input(s): HGBA1C in the last 72 hours. CBG: No results for input(s): GLUCAP in the last 168 hours. Lipid Profile: No results for input(s): CHOL, HDL, LDLCALC, TRIG, CHOLHDL, LDLDIRECT in the last 72 hours. Thyroid  Function Tests: No results for input(s): TSH, T4TOTAL, FREET4, T3FREE, THYROIDAB in the last 72 hours. Anemia Panel: No results for input(s): VITAMINB12, FOLATE, FERRITIN, TIBC, IRON, RETICCTPCT in the last 72 hours. Sepsis Labs: Recent Labs  Lab 05/30/24 1047 05/30/24 1242 05/30/24 1443 05/30/24 1600  LATICACIDVEN 2.5* 2.7* 1.4 1.3     Recent Results (from the past 240 hours)  Resp panel by RT-PCR (RSV, Flu A&B, Covid) Anterior Nasal Swab     Status: Abnormal   Collection Time: 05/30/24 10:22 AM   Specimen: Anterior Nasal Swab  Result Value Ref Range Status   SARS Coronavirus 2 by RT PCR NEGATIVE NEGATIVE Final   Influenza A by PCR POSITIVE (A) NEGATIVE Final   Influenza B by PCR NEGATIVE NEGATIVE Final    Comment: (NOTE) The Xpert Xpress SARS-CoV-2/FLU/RSV plus assay is intended as an aid in the diagnosis of influenza from Nasopharyngeal swab specimens and should not be used as a sole basis for treatment. Nasal washings and aspirates are unacceptable for Xpert Xpress SARS-CoV-2/FLU/RSV testing.  Fact Sheet for Patients: bloggercourse.com  Fact Sheet for Healthcare Providers: seriousbroker.it  This test is not yet approved or cleared by the [place]  FDA and has been authorized for detection and/or diagnosis of SARS-CoV-2 by FDA under an Emergency Use Authorization (EUA). This EUA will remain in effect (meaning this test can be used) for the duration of the COVID-19 declaration under Section 564(b)(1) of the Act, 21 U.S.C. section 360bbb-3(b)(1), unless the  authorization is terminated or revoked.     Resp Syncytial Virus by PCR NEGATIVE NEGATIVE Final    Comment: (NOTE) Fact Sheet for Patients: bloggercourse.com  Fact Sheet for Healthcare Providers: seriousbroker.it  This test is not yet approved or cleared by the [place]  FDA and has been authorized for detection and/or diagnosis of SARS-CoV-2 by FDA under an Emergency Use Authorization (EUA). This EUA will remain in effect (meaning this test can be used) for the duration of the COVID-19 declaration under Section 564(b)(1) of the Act, 21 U.S.C. section 360bbb-3(b)(1), unless the authorization is terminated or revoked.  Performed at [facility], [address] [place], [place] [address]   Blood Culture (routine x 2)     Status: None (Preliminary result)   Collection Time: 05/30/24 10:33 AM   Specimen: BLOOD RIGHT HAND  Result Value Ref Range Status   Specimen Description BLOOD RIGHT HAND  Final   Special Requests   Final    AEROBIC BOTTLE ONLY Blood Culture results may not be optimal due to an inadequate volume of blood received in culture bottles   Culture   Final    NO GROWTH 4 DAYS Performed at [facility], [address], [place], [place] [address]    Report Status PENDING  Incomplete  Blood Culture (routine x 2)     Status: None (Preliminary result)   Collection Time: 05/30/24 10:49 AM   Specimen: BLOOD RIGHT ARM  Result Value Ref Range Status   Specimen Description BLOOD RIGHT ARM  Final   Special Requests   Final    AEROBIC BOTTLE ONLY Blood Culture results may not be optimal due to an inadequate volume of blood received in culture bottles   Culture   Final    NO GROWTH 4 DAYS Performed at [facility], [address] [place], [place] [address]    Report Status PENDING  Incomplete         Radiology Studies: DG CHEST PORT 1 VIEW Result Date: 06/02/2024 EXAM: 1 VIEW(S) XRAY OF THE CHEST 06/02/2024 03:57:00 PM COMPARISON: 05/30/2024 CLINICAL HISTORY: Dyspnea FINDINGS: LUNGS AND PLEURA: Low lung volumes. Left basilar atelectasis. Large hiatal hernia with majority of stomach in left lung base, unchanged, with associated passive atelectasis. No pleural effusion. No pneumothorax. HEART AND MEDIASTINUM: Aortic atherosclerosis. No acute abnormality of the cardiac and mediastinal silhouettes. BONES AND SOFT TISSUES: No acute osseous abnormality. IMPRESSION: 1. Large hiatal hernia with majority of stomach in the left lung base with associated passive left basilar atelectasis. 2. Low lung volumes. 3. Aortic atherosclerosis. Electronically signed by: Ryan Salvage MD MD 06/02/2024 05:03 PM EST RP  Workstation: [id number]         Scheduled Meds:  arformoterol   15 mcg Nebulization BID   atorvastatin   40 mg Oral QPM   baclofen   20 mg Oral TID   budesonide  (PULMICORT ) nebulizer solution  0.25 mg Nebulization BID   ciprofloxacin   750 mg Oral BID   clopidogrel   75 mg Oral Daily   enoxaparin  (LOVENOX ) injection  40 mg Subcutaneous Q24H   gabapentin   200 mg Oral TID   Gerhardt's butt cream   Topical BID   guaiFENesin   1,200 mg Oral BID   methylPREDNISolone  (SOLU-MEDROL ) injection  40 mg Intravenous Daily   oseltamivir   75 mg Oral BID   pantoprazole   40 mg Oral BID   sertraline   125 mg Oral Daily   sodium chloride  HYPERTONIC  4 mL Nebulization Daily  tamsulosin   0.4 mg Oral QHS   trimethoprim   100 mg Oral QHS   Continuous Infusions:  ampicillin -sulbactam (UNASYN ) IV 3 g (06/03/24 0822)     LOS: 3 days    Time spent: 35 Minutes    Gregory Crite A Shanasia Ibrahim, MD [facility]   If 7PM-7AM, please contact night-coverage [email or web address]  06/03/2024, 12:30 PM   "

## 2024-06-03 NOTE — TOC Progression Note (Signed)
 Transition of Care [facility]) - Progression Note    Patient Details  Name: Gregory Kerr MRN: [id number] Date of Birth: 01/23/1941  Transition of Care [facility]) CM/SW Contact  Luann SHAUNNA [place], [place] Phone Number: 06/03/2024, 12:25 PM  Clinical Narrative:     [facility] with [name] to clarify isolation policy. They require 7 days of isolation. Day 7 would be Friday though per their policy pt would require isolation all of Friday so could not admit until Saturday. Plan to admit Saturday if pt is medically ready unless a single room becomes available.   Expected Discharge Plan: Long Term Nursing Home Barriers to Discharge: Continued Medical Work up               Expected Discharge Plan and Services     Post Acute Care Choice: Skilled Nursing Facility Living arrangements for the past 2 months: Skilled Nursing Facility                                       Social Drivers of Health (SDOH) Interventions SDOH Screenings   Food Insecurity: No Food Insecurity (05/31/2024)  Housing: Low Risk (05/31/2024)  Transportation Needs: No Transportation Needs (05/31/2024)  Utilities: Not At Risk (05/31/2024)  Social Connections: Socially Isolated (05/31/2024)  Tobacco Use: Medium Risk (05/23/2024)    Readmission Risk Interventions    02/15/2022   12:13 PM 10/13/2021   12:30 PM  Readmission Risk Prevention Plan  Transportation Screening Complete Complete  Medication Review (RN Care Manager) Complete Referral to Pharmacy  PCP or Specialist appointment within 3-5 days of discharge Complete Not Complete  PCP/Specialist Appt Not Complete comments  Still inpatient without discharge date at this time  HRI or Home Care Consult Complete Not Complete  HRI or Home Care Consult Pt Refusal Comments  Patient is LTC at SNF  SW Recovery Care/Counseling Consult Complete Complete  Palliative Care Screening Complete Not Applicable  Skilled Nursing Facility Complete Complete

## 2024-06-03 NOTE — Plan of Care (Signed)

## 2024-06-03 NOTE — Plan of Care (Signed)
" °  Problem: Health Behavior/Discharge Planning: Goal: Ability to manage health-related needs will improve Outcome: Progressing   Problem: Clinical Measurements: Goal: Will remain free from infection Outcome: Progressing   Problem: Clinical Measurements: Goal: Respiratory complications will improve Outcome: Progressing   Problem: Activity: Goal: Risk for activity intolerance will decrease Outcome: Progressing   Problem: Coping: Goal: Level of anxiety will decrease Outcome: Progressing   Problem: Elimination: Goal: Will not experience complications related to bowel motility Outcome: Progressing   Problem: Elimination: Goal: Will not experience complications related to urinary retention Outcome: Progressing   Problem: Safety: Goal: Ability to remain free from injury will improve Outcome: Progressing   Problem: Skin Integrity: Goal: Risk for impaired skin integrity will decrease Outcome: Progressing   "

## 2024-06-04 DIAGNOSIS — J101 Influenza due to other identified influenza virus with other respiratory manifestations: Secondary | ICD-10-CM | POA: Diagnosis not present

## 2024-06-04 LAB — CULTURE, BLOOD (ROUTINE X 2)
Culture: NO GROWTH
Culture: NO GROWTH

## 2024-06-04 MED ORDER — IPRATROPIUM-ALBUTEROL 0.5-2.5 (3) MG/3ML IN SOLN
3.0000 mL | RESPIRATORY_TRACT | Status: DC | PRN
  Administered 2024-06-04: 3 mL via RESPIRATORY_TRACT
  Filled 2024-06-04: qty 3

## 2024-06-04 MED ORDER — ONDANSETRON HCL 4 MG/2ML IJ SOLN: 4.0000 mg | Freq: Four times a day (QID) | INTRAMUSCULAR | Status: DC | PRN

## 2024-06-04 MED ORDER — METOPROLOL TARTRATE 5 MG/5ML IV SOLN: 5.0000 mg | INTRAVENOUS | Status: DC | PRN

## 2024-06-04 MED ORDER — PREDNISONE 5 MG PO TABS
50.0000 mg | ORAL_TABLET | Freq: Every day | ORAL | Status: DC
  Administered 2024-06-05 – 2024-06-06 (×2): 50 mg via ORAL
  Filled 2024-06-04 (×2): qty 2

## 2024-06-04 MED ORDER — SENNOSIDES-DOCUSATE SODIUM 8.6-50 MG PO TABS: 1.0000 | ORAL_TABLET | Freq: Every evening | ORAL | Status: DC | PRN

## 2024-06-04 MED ORDER — AMOXICILLIN-POT CLAVULANATE 875-125 MG PO TABS
1.0000 | ORAL_TABLET | Freq: Two times a day (BID) | ORAL | Status: DC
  Administered 2024-06-04 – 2024-06-06 (×5): 1 via ORAL
  Filled 2024-06-04 (×6): qty 1

## 2024-06-04 MED ORDER — GLUCAGON HCL RDNA (DIAGNOSTIC) 1 MG IJ SOLR: 1.0000 mg | INTRAMUSCULAR | Status: DC | PRN

## 2024-06-04 MED ORDER — HYDRALAZINE HCL 20 MG/ML IJ SOLN: 10.0000 mg | INTRAMUSCULAR | Status: DC | PRN

## 2024-06-04 NOTE — Progress Notes (Signed)
 " PROGRESS NOTE    Gregory Kerr  FMW:[id number] DOB: 1941/02/12 DOA: 05/30/2024 PCP: Donnella Agreste    Brief Narrative:   84 year old with past medical history significant for heart failure preserved ejection fraction, CVA with residual deficits left-sided hemiparesis, hypertension, hyperlipidemia, multidrug-resistant UTI, BPH, recent admission from 1/1 through 1/7 for sepsis secondary to Proteus and Enterococcus acute cystitis with acute bilateral retention status post bilateral JJ stent by urology on 1/2 discharged on ciprofloxacin  to complete 7-day course through 1/13 per ID. Presented with worsening confusion, found to have failure to thrive and influenza A.   Assessment & Plan:   Sepsis secondary to Influenza A, POA Acute Bronchitis and Aspiration.  Failure to thrive; Adult -Continue Tamiflu  total 5 days also on Unasyn  > p.o. Augmentin .  Change Solu-Medrol  to prednisone  -Bronchodilators scheduled and as needed.  I-S/flutter valve   Acute metabolic encephalopathy: Secondary to underlying infection.  Seems to be improving   Proteus Enterococcus acute cystitis with bilateral urinary retention, Pseudomonas bacteremia -Status post bilateral JJ stent per urology Dr. Elisabeth on 1/2.  ID recommended p.o. Cipro  until 1/13.  Completed course.  Outpatient follow-up.     History of CVA with residual deficits/left-sided hemiparesis -Continue Plavix , -Continue baclofen , gabapentin  and Zoloft    Compression fracture -Patient was found to have L3 compression fracture 50% height loss -Pain management.  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   PT Follow up Recs: Skilled Nursing-Short Term Rehab (<3 Hours/Day)05/31/2024 [phone]  Subjective: Seen at bedside reporting of some cough and congestion Sister present at bedside  Examination:  General exam: Appears calm and comfortable  Respiratory system: Clear to auscultation. Respiratory effort normal. Cardiovascular system: S1 & S2 heard, RRR. No JVD, murmurs, rubs, gallops or clicks. No pedal edema. Gastrointestinal system: Abdomen is nondistended, soft and nontender. No organomegaly or masses felt. Normal bowel sounds heard. Central nervous system: Alert and oriented. No focal neurological deficits. Extremities: Hand deformity noted Skin: No rashes, lesions or ulcers Psychiatry: Judgement and insight appear normal. Mood & affect appropriate.                Diet Orders (From admission, onward)     Start     Ordered   05/31/24 1427  Diet regular Room service appropriate? Yes with Assist; Fluid consistency: Thin  Diet effective now       Question Answer Comment  Room service appropriate? Yes with Assist   Fluid consistency: Thin      05/31/24 1426            Objective: Vitals:   06/04/24 0727 06/04/24 0759 06/04/24 0802 06/04/24 1104  BP: (!) 149/72   137/61  Pulse: 63 70 70 70  Resp: 16 20  16   Temp: 97.8 F (36.6 C)   98.4 F (36.9 C)  TempSrc: Oral   Oral  SpO2: 93%  94%  96%    Intake/Output Summary (Last 24 hours) at 06/04/2024 1312 Last data filed at 06/04/2024 0800 Gross per 24 hour  Intake 780 ml  Output 1450 ml  Net -670 ml   There were no vitals filed for this visit.  Scheduled Meds:   amoxicillin -clavulanate  1 tablet Oral BID   arformoterol   15 mcg Nebulization BID   atorvastatin   40 mg Oral QPM   baclofen   20 mg Oral TID   budesonide  (PULMICORT ) nebulizer solution  0.25 mg Nebulization BID   clopidogrel   75 mg Oral Daily   enoxaparin  (LOVENOX ) injection  40 mg Subcutaneous Q24H   gabapentin   200 mg Oral TID   Gerhardt's butt cream   Topical BID   guaiFENesin   1,200 mg Oral BID   pantoprazole   40 mg Oral BID   [START ON 06/05/2024] predniSONE   50 mg Oral Q breakfast   sertraline   125 mg Oral Daily   tamsulosin   0.4 mg Oral QHS   trimethoprim   100 mg Oral QHS   Continuous Infusions:  Nutritional status     There is no height or weight on file to calculate BMI.  Data Reviewed:   CBC: Recent Labs  Lab 05/30/24 1033 05/30/24 1501 05/31/24 0103  WBC 10.9* 8.3 7.8  NEUTROABS 7.9*  --   --   HGB 13.2 10.7* 11.1*  HCT 42.2 33.7* 34.2*  MCV 93.6 93.4 91.0  PLT 203 154 162   Basic Metabolic Panel: Recent Labs  Lab 05/30/24 1134 05/30/24 1501 05/31/24 0103 06/01/24 1238  NA 141  --  136 137  K 3.7  --  3.8 3.9  CL 107  --  104 105  CO2 22  --  22 21*  GLUCOSE 155*  --  95 165*  BUN 18  --  17 17  CREATININE 0.85 0.73 0.67 0.81  CALCIUM  8.7*  --  8.1* 8.2*   GFR: Estimated Creatinine Clearance: 70.4 mL/min (by C-G formula based on SCr of 0.81 mg/dL). Liver Function Tests: Recent Labs  Lab 05/30/24 1134  AST 31  ALT 33  ALKPHOS 80  BILITOT 0.4  PROT 6.9  ALBUMIN 3.2*   No results for input(s): LIPASE, AMYLASE in the last 168 hours. No results for input(s): AMMONIA in the last 168 hours. Coagulation Profile: Recent Labs  Lab 05/30/24 1033  INR 1.1   Cardiac Enzymes: No results for input(s): CKTOTAL, CKMB, CKMBINDEX, TROPONINI in the last 168 hours. BNP (last 3 results) No results for input(s): PROBNP in the last 8760 hours. HbA1C: No results for input(s): HGBA1C in the last 72 hours. CBG: No results for  input(s): GLUCAP in the last 168 hours. Lipid Profile: No results for input(s): CHOL, HDL, LDLCALC, TRIG, CHOLHDL, LDLDIRECT in the last 72 hours. Thyroid  Function Tests: No results for input(s): TSH, T4TOTAL, FREET4, T3FREE, THYROIDAB in the last 72 hours. Anemia Panel: No results for input(s): VITAMINB12, FOLATE, FERRITIN, TIBC, IRON, RETICCTPCT in the last 72 hours. Sepsis Labs: Recent Labs  Lab 05/30/24 1047 05/30/24 1242 05/30/24 1443 05/30/24 1600  LATICACIDVEN 2.5* 2.7* 1.4 1.3    Recent Results (from the past 240 hours)  Resp panel by RT-PCR (RSV, Flu A&B, Covid) Anterior Nasal Swab     Status: Abnormal   Collection Time: 05/30/24 10:22 AM   Specimen: Anterior Nasal Swab  Result Value Ref Range Status   SARS Coronavirus 2 by RT PCR NEGATIVE NEGATIVE Final   Influenza A by PCR POSITIVE (A) NEGATIVE  Final   Influenza B by PCR NEGATIVE NEGATIVE Final    Comment: (NOTE) The Xpert Xpress SARS-CoV-2/FLU/RSV plus assay is intended as an aid in the diagnosis of influenza from Nasopharyngeal swab specimens and should not be used as a sole basis for treatment. Nasal washings and aspirates are unacceptable for Xpert Xpress SARS-CoV-2/FLU/RSV testing.  Fact Sheet for Patients: bloggercourse.com  Fact Sheet for Healthcare Providers: seriousbroker.it  This test is not yet approved or cleared by the [place]  FDA and has been authorized for detection and/or diagnosis of SARS-CoV-2 by FDA under an Emergency Use Authorization (EUA). This EUA will remain in effect (meaning this test can be used) for the duration of the COVID-19 declaration under Section 564(b)(1) of the Act, 21 U.S.C. section 360bbb-3(b)(1), unless the authorization is terminated or revoked.     Resp Syncytial Virus by PCR NEGATIVE NEGATIVE Final    Comment: (NOTE) Fact Sheet for  Patients: bloggercourse.com  Fact Sheet for Healthcare Providers: seriousbroker.it  This test is not yet approved or cleared by the [place]  FDA and has been authorized for detection and/or diagnosis of SARS-CoV-2 by FDA under an Emergency Use Authorization (EUA). This EUA will remain in effect (meaning this test can be used) for the duration of the COVID-19 declaration under Section 564(b)(1) of the Act, 21 U.S.C. section 360bbb-3(b)(1), unless the authorization is terminated or revoked.  Performed at [facility], [address] [place], [place] [address]   Blood Culture (routine x 2)     Status: None   Collection Time: 05/30/24 10:33 AM   Specimen: BLOOD RIGHT HAND  Result Value Ref Range Status   Specimen Description BLOOD RIGHT HAND  Final   Special Requests   Final    AEROBIC BOTTLE ONLY Blood Culture results may not be optimal due to an inadequate volume of blood received in culture bottles   Culture   Final    NO GROWTH 5 DAYS Performed at [facility], [address] [place], [place] [address]    Report Status 06/04/2024 FINAL  Final  Blood Culture (routine x 2)     Status: None   Collection Time: 05/30/24 10:49 AM   Specimen: BLOOD RIGHT ARM  Result Value Ref Range Status   Specimen Description BLOOD RIGHT ARM  Final   Special Requests   Final    AEROBIC BOTTLE ONLY Blood Culture results may not be optimal due to an inadequate volume of blood received in culture bottles   Culture   Final    NO GROWTH 5 DAYS Performed at [facility], [address], [place], [place] [address]    Report Status 06/04/2024 FINAL  Final         Radiology Studies: DG CHEST PORT 1 VIEW Result Date: 06/02/2024 EXAM: 1 VIEW(S) XRAY OF THE CHEST 06/02/2024 03:57:00 PM COMPARISON: 05/30/2024 CLINICAL HISTORY: Dyspnea FINDINGS: LUNGS AND PLEURA: Low lung volumes. Left basilar atelectasis. Large hiatal hernia with  majority of stomach in left lung base, unchanged, with associated passive atelectasis. No pleural effusion. No pneumothorax. HEART AND MEDIASTINUM: Aortic atherosclerosis. No acute abnormality of the cardiac and mediastinal silhouettes. BONES AND SOFT TISSUES: No acute osseous abnormality. IMPRESSION: 1. Large hiatal hernia with majority of stomach in the left lung base with associated passive left basilar atelectasis. 2. Low lung volumes. 3. Aortic atherosclerosis. Electronically signed by: Ryan Salvage MD MD 06/02/2024 05:03 PM EST RP Workstation: [id number]  LOS: 4 days   Time spent= 35 mins    Burgess JAYSON Dare, MD [facility]  If 7PM-7AM, please contact night-coverage  06/04/2024, 1:12 PM  "

## 2024-06-04 NOTE — TOC Progression Note (Signed)
 Transition of Care [facility]) - Progression Note    Patient Details  Name: [name] MRN: [id number] Date of Birth: [birth year]  Transition of Care [facility]) CM/SW Contact  Almarie CHRISTELLA [place]ie, [place] Phone Number: 06/04/2024, 9:54 AM  Clinical Narrative:   CSW reached out to [facility] to check on private room availability, and nothing is available today. CSW to follow.    Expected Discharge Plan: Long Term Nursing Home Barriers to Discharge: Continued Medical Work up               Expected Discharge Plan and Services     Post Acute Care Choice: Skilled Nursing Facility Living arrangements for the past 2 months: Skilled Nursing Facility                                       Social Drivers of Health (SDOH) Interventions SDOH Screenings   Food Insecurity: No Food Insecurity (05/31/2024)  Housing: Low Risk (05/31/2024)  Transportation Needs: No Transportation Needs (05/31/2024)  Utilities: Not At Risk (05/31/2024)  Social Connections: Socially Isolated (05/31/2024)  Tobacco Use: Medium Risk (05/23/2024)    Readmission Risk Interventions    02/15/2022   12:13 PM 10/13/2021   12:30 PM  Readmission Risk Prevention Plan  Transportation Screening Complete Complete  Medication Review (RN Care Manager) Complete Referral to Pharmacy  PCP or Specialist appointment within 3-5 days of discharge Complete Not Complete  PCP/Specialist Appt Not Complete comments  Still inpatient without discharge date at this time  HRI or Home Care Consult Complete Not Complete  HRI or Home Care Consult Pt Refusal Comments  Patient is LTC at SNF  SW Recovery Care/Counseling Consult Complete Complete  Palliative Care Screening Complete Not Applicable  Skilled Nursing Facility Complete Complete

## 2024-06-04 NOTE — Plan of Care (Signed)
" °  Problem: Health Behavior/Discharge Planning: Goal: Ability to manage health-related needs will improve Outcome: Progressing    Problem: Education: Goal: Knowledge of General Education information will improve Description: Including pain rating scale, medication(s)/side effects and non-pharmacologic comfort measures Outcome: Progressing   Problem: Clinical Measurements: Goal: Will remain free from infection Outcome: Progressing   Problem: Clinical Measurements: Goal: Respiratory complications will improve Outcome: Progressing   Problem: Activity: Goal: Risk for activity intolerance will decrease Outcome: Progressing   Problem: Nutrition: Goal: Adequate nutrition will be maintained Outcome: Progressing   Problem: Coping: Goal: Level of anxiety will decrease Outcome: Progressing   Problem: Elimination: Goal: Will not experience complications related to bowel motility Outcome: Progressing   Problem: Safety: Goal: Ability to remain free from injury will improve Outcome: Progressing   Problem: Skin Integrity: Goal: Risk for impaired skin integrity will decrease Outcome: Progressing    "

## 2024-06-04 NOTE — Progress Notes (Signed)
 Dr. Caleen ok with patient not having IV access.

## 2024-06-04 NOTE — Progress Notes (Signed)
 Speech Language Pathology Treatment: Dysphagia  Patient Details Name: Gregory Kerr MRN: [id number] DOB: 27-Mar-1941 Today's Date: 06/04/2024 Time: 9055-9047 SLP Time Calculation (min) (ACUTE ONLY): 8 min  Assessment / Plan / Recommendation Clinical Impression  Continue regular texture, thin liquids, strategies for respiratory/COPD and esophageal precautions, no further ST needed.   On arrival pt has noted audible/congested respirations pharyngeal vs chest and volitional cough weak and unproductive. He states this has been present since admission and denies coughing with po's. No s/s aspiration with multiple straw sips thin, mildly increased work of breathing possibly from apneic period during swallow with respiratory status. Mastication was effective. History of GERD but denies globus sensation. CXR revealed large hiatal hernia with majority of stomach in the left lung base with associated passive left basilar atelectasis. Educated pt re: relationship of COPD and potential dysphagia, esophageal precautions. Pt verbalized understanding.    HPI HPI: 84 y.o. male presents to [facility]23 from [facility] with worsening confusion. Workup revealed FluA+, acute cystitis c/b acute urinary retention, compression fx of L3 and large hiatal hernia, conservative management. Two prior  BSE in 2023. no needs. PMH: HTN, CVA with L spastic hemiplegia, CHF, COPD.      SLP Plan  All goals met;Discharge SLP treatment due to (comment)        Swallow Evaluation Recommendations   Recommendations: PO diet PO Diet Recommendation: Regular;Thin liquids (Level 0) Liquid Administration via: Cup;Straw Medication Administration: Whole meds with liquid Supervision: Staff to assist with self-feeding Postural changes: Stay upright 30-60 min after meals;Position pt fully upright for meals Oral care recommendations: Oral care BID (2x/day)     Recommendations                     Oral care BID   Set up  Supervision/Assistance Dysphagia, unspecified (R13.10)     All goals met;Discharge SLP treatment due to (comment)     [name]  06/04/2024, 9:59 AM

## 2024-06-04 NOTE — Hospital Course (Addendum)
 Brief Narrative:   84 year old with past medical history significant for heart failure preserved ejection fraction, CVA with residual deficits left-sided hemiparesis, hypertension, hyperlipidemia, multidrug-resistant UTI, BPH, recent admission from 1/1 through 1/7 for sepsis secondary to Proteus and Enterococcus acute cystitis with acute bilateral retention status post bilateral JJ stent by urology on 1/2 discharged on ciprofloxacin  to complete 7-day course through 1/13 per ID. Presented with worsening confusion, found to have failure to thrive and influenza A.   During the hospitalization patient did well and will be discharged in stable condition.  Rest of the details have been listed below.  Assessment & Plan:   Sepsis secondary to Influenza A, POA Acute Bronchitis and Aspiration.  Improved Failure to thrive; Adult - Completed Tamiflu , antibiotic and steroid course in the hospital. -Bronchodilators scheduled and as needed.  I-S/flutter valve   Acute metabolic encephalopathy: Improved Secondary to underlying infection.  Seems to be improving   Proteus Enterococcus acute cystitis with bilateral urinary retention, Pseudomonas bacteremia -Status post bilateral JJ stent per urology Dr. Elisabeth on 1/2.  ID recommended p.o. Cipro  until 1/13.  Completed course.  Outpatient follow-up.     History of CVA with residual deficits/left-sided hemiparesis -Continue Plavix , -Continue baclofen , gabapentin  and Zoloft    Compression fracture -Patient was found to have L3 compression fracture 50% height loss -Pain management.  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 Respiratory effort normal. Cardiovascular system: S1 & S2 heard, RRR. No JVD, murmurs, rubs, gallops or clicks. No pedal edema. Gastrointestinal system: Abdomen is nondistended, soft and nontender. No organomegaly or masses felt. Normal bowel sounds heard. Central nervous system: Alert and oriented. No focal neurological deficits. Extremities: Hand deformity noted Skin: No rashes, lesions or ulcers Psychiatry: Judgement and insight appear normal. Mood & affect appropriate.

## 2024-06-05 DIAGNOSIS — J101 Influenza due to other identified influenza virus with other respiratory manifestations: Secondary | ICD-10-CM | POA: Diagnosis not present

## 2024-06-05 LAB — PHOSPHORUS: Phosphorus: 2.5 mg/dL (ref 2.5–4.6)

## 2024-06-05 LAB — CBC
HCT: 34.8 % — ABNORMAL LOW (ref 39.0–52.0)
Hemoglobin: 11.3 g/dL — ABNORMAL LOW (ref 13.0–17.0)
MCH: 29.1 pg (ref 26.0–34.0)
MCHC: 32.5 g/dL (ref 30.0–36.0)
MCV: 89.7 fL (ref 80.0–100.0)
Platelets: 270 K/uL (ref 150–400)
RBC: 3.88 MIL/uL — ABNORMAL LOW (ref 4.22–5.81)
RDW: 13.7 % (ref 11.5–15.5)
WBC: 7.1 K/uL (ref 4.0–10.5)
nRBC: 0 % (ref 0.0–0.2)

## 2024-06-05 LAB — BASIC METABOLIC PANEL WITH GFR
Anion gap: 9 (ref 5–15)
BUN: 20 mg/dL (ref 8–23)
CO2: 24 mmol/L (ref 22–32)
Calcium: 8.7 mg/dL — ABNORMAL LOW (ref 8.9–10.3)
Chloride: 106 mmol/L (ref 98–111)
Creatinine, Ser: 0.69 mg/dL (ref 0.61–1.24)
GFR, Estimated: >60 mL/min
Glucose, Bld: 210 mg/dL — ABNORMAL HIGH (ref 70–99)
Potassium: 4.1 mmol/L (ref 3.5–5.1)
Sodium: 139 mmol/L (ref 135–145)

## 2024-06-05 LAB — MAGNESIUM: Magnesium: 1.9 mg/dL (ref 1.7–2.4)

## 2024-06-05 MED ORDER — REVEFENACIN 175 MCG/3ML IN SOLN: 175.0000 ug | Freq: Every day | RESPIRATORY_TRACT | Status: DC

## 2024-06-05 NOTE — Plan of Care (Signed)

## 2024-06-05 NOTE — Progress Notes (Signed)
 " PROGRESS NOTE    Gregory Kerr  FMW:[id number] DOB: 08/13/1940 DOA: 05/30/2024 PCP: Donnella Agreste    Brief Narrative:   84 year old with past medical history significant for heart failure preserved ejection fraction, CVA with residual deficits left-sided hemiparesis, hypertension, hyperlipidemia, multidrug-resistant UTI, BPH, recent admission from 1/1 through 1/7 for sepsis secondary to Proteus and Enterococcus acute cystitis with acute bilateral retention status post bilateral JJ stent by urology on 1/2 discharged on ciprofloxacin  to complete 7-day course through 1/13 per ID. Presented with worsening confusion, found to have failure to thrive and influenza A.   Assessment & Plan:   Sepsis secondary to Influenza A, POA Acute Bronchitis and Aspiration.  Failure to thrive; Adult -Continue Tamiflu  total 5 days also on Unasyn  > p.o. Augmentin .  Change Solu-Medrol  to prednisone  -Bronchodilators scheduled and as needed.  I-S/flutter valve   Acute metabolic encephalopathy: Secondary to underlying infection.  Seems to be improving   Proteus Enterococcus acute cystitis with bilateral urinary retention, Pseudomonas bacteremia -Status post bilateral JJ stent per urology Dr. Elisabeth on 1/2.  ID recommended p.o. Cipro  until 1/13.  Completed course.  Outpatient follow-up.     History of CVA with residual deficits/left-sided hemiparesis -Continue Plavix , -Continue baclofen , gabapentin  and Zoloft    Compression fracture -Patient was found to have L3 compression fracture 50% height loss -Pain management.  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   PT Follow up Recs: Skilled Nursing-Short Term Rehab (<3 Hours/Day)05/31/2024 [id number]  Subjective: Still having some cough and congestion but overall feeling better.  Examination:  General exam: Appears calm and comfortable  Respiratory system: Clear to auscultation. Respiratory effort normal. Cardiovascular system: S1 & S2 heard, RRR. No JVD, murmurs, rubs, gallops or clicks. No pedal edema. Gastrointestinal system: Abdomen is nondistended, soft and nontender. No organomegaly or masses felt. Normal bowel sounds heard. Central nervous system: Alert and oriented. No focal neurological deficits. Extremities: Hand deformity noted Skin: No rashes, lesions or ulcers Psychiatry: Judgement and insight appear normal. Mood & affect appropriate.                Diet Orders (From admission, onward)     Start     Ordered   05/31/24 1427  Diet regular Room service appropriate? Yes with Assist; Fluid consistency: Thin  Diet effective now       Question Answer Comment  Room service appropriate? Yes with Assist   Fluid consistency: Thin      05/31/24 1426            Objective: Vitals:   06/04/24 2028 06/05/24 0019 06/05/24 0744 06/05/24 0802  BP: (!) 167/95 (!) 160/79  (!) 153/74  Pulse: 92 75  71  Resp: 18 18  18   Temp: 99.6 F (37.6 C) (!) 97.5 F (36.4 C)  98.2 F (36.8 C)  TempSrc: Oral Oral  Oral  SpO2: 91% 92% 93% 95%    Intake/Output Summary (Last 24 hours) at 06/05/2024 1133 Last data filed at 06/05/2024 0500 Gross per 24 hour  Intake 780 ml  Output 1700 ml  Net -920 ml   There were no vitals filed for this visit.  Scheduled  Meds:  amoxicillin -clavulanate  1 tablet Oral BID   arformoterol   15 mcg Nebulization BID   atorvastatin   40 mg Oral QPM   baclofen   20 mg Oral TID   budesonide  (PULMICORT ) nebulizer solution  0.25 mg Nebulization BID   clopidogrel   75 mg Oral Daily   enoxaparin  (LOVENOX ) injection  40 mg Subcutaneous Q24H   gabapentin   200 mg Oral TID   Gerhardt's butt cream   Topical BID   guaiFENesin   1,200 mg Oral BID   pantoprazole   40 mg Oral BID   predniSONE   50 mg Oral Q breakfast   sertraline   125 mg Oral Daily   tamsulosin   0.4 mg Oral QHS   trimethoprim   100 mg Oral QHS   Continuous Infusions:  Nutritional status     There is no height or weight on file to calculate BMI.  Data Reviewed:   CBC: Recent Labs  Lab 05/30/24 1033 05/30/24 1501 05/31/24 0103 06/05/24 0128  WBC 10.9* 8.3 7.8 7.1  NEUTROABS 7.9*  --   --   --   HGB 13.2 10.7* 11.1* 11.3*  HCT 42.2 33.7* 34.2* 34.8*  MCV 93.6 93.4 91.0 89.7  PLT 203 154 162 270   Basic Metabolic Panel: Recent Labs  Lab 05/30/24 1134 05/30/24 1501 05/31/24 0103 06/01/24 1238 06/05/24 0128  NA 141  --  136 137 139  K 3.7  --  3.8 3.9 4.1  CL 107  --  104 105 106  CO2 22  --  22 21* 24  GLUCOSE 155*  --  95 165* 210*  BUN 18  --  17 17 20   CREATININE 0.85 0.73 0.67 0.81 0.69  CALCIUM  8.7*  --  8.1* 8.2* 8.7*  MG  --   --   --   --  1.9  PHOS  --   --   --   --  2.5   GFR: Estimated Creatinine Clearance: 71.3 mL/min (by C-G formula based on SCr of 0.69 mg/dL). Liver Function Tests: Recent Labs  Lab 05/30/24 1134  AST 31  ALT 33  ALKPHOS 80  BILITOT 0.4  PROT 6.9  ALBUMIN 3.2*   No results for input(s): LIPASE, AMYLASE in the last 168 hours. No results for input(s): AMMONIA in the last 168 hours. Coagulation Profile: Recent Labs  Lab 05/30/24 1033  INR 1.1   Cardiac Enzymes: No results for input(s): CKTOTAL, CKMB, CKMBINDEX, TROPONINI in the last 168 hours. BNP (last 3 results) No results  for input(s): PROBNP in the last 8760 hours. HbA1C: No results for input(s): HGBA1C in the last 72 hours. CBG: No results for input(s): GLUCAP in the last 168 hours. Lipid Profile: No results for input(s): CHOL, HDL, LDLCALC, TRIG, CHOLHDL, LDLDIRECT in the last 72 hours. Thyroid  Function Tests: No results for input(s): TSH, T4TOTAL, FREET4, T3FREE, THYROIDAB in the last 72 hours. Anemia Panel: No results for input(s): VITAMINB12, FOLATE, FERRITIN, TIBC, IRON, RETICCTPCT in the last 72 hours. Sepsis Labs: Recent Labs  Lab 05/30/24 1047 05/30/24 1242 05/30/24 1443 05/30/24 1600  LATICACIDVEN 2.5* 2.7* 1.4 1.3    Recent Results (from the past 240 hours)  Resp panel by RT-PCR (RSV, Flu A&B,  Covid) Anterior Nasal Swab     Status: Abnormal   Collection Time: 05/30/24 10:22 AM   Specimen: Anterior Nasal Swab  Result Value Ref Range Status   SARS Coronavirus 2 by RT PCR NEGATIVE NEGATIVE Final   Influenza A by PCR POSITIVE (A) NEGATIVE Final   Influenza B by PCR NEGATIVE NEGATIVE Final    Comment: (NOTE) The Xpert Xpress SARS-CoV-2/FLU/RSV plus assay is intended as an aid in the diagnosis of influenza from Nasopharyngeal swab specimens and should not be used as a sole basis for treatment. Nasal washings and aspirates are unacceptable for Xpert Xpress SARS-CoV-2/FLU/RSV testing.  Fact Sheet for Patients: bloggercourse.com  Fact Sheet for Healthcare Providers: seriousbroker.it  This test is not yet approved or cleared by the [place]  FDA and has been authorized for detection and/or diagnosis of SARS-CoV-2 by FDA under an Emergency Use Authorization (EUA). This EUA will remain in effect (meaning this test can be used) for the duration of the COVID-19 declaration under Section 564(b)(1) of the Act, 21 U.S.C. section 360bbb-3(b)(1), unless the authorization is terminated or revoked.      Resp Syncytial Virus by PCR NEGATIVE NEGATIVE Final    Comment: (NOTE) Fact Sheet for Patients: bloggercourse.com  Fact Sheet for Healthcare Providers: seriousbroker.it  This test is not yet approved or cleared by the [place]  FDA and has been authorized for detection and/or diagnosis of SARS-CoV-2 by FDA under an Emergency Use Authorization (EUA). This EUA will remain in effect (meaning this test can be used) for the duration of the COVID-19 declaration under Section 564(b)(1) of the Act, 21 U.S.C. section 360bbb-3(b)(1), unless the authorization is terminated or revoked.  Performed at [facility], [address] [place], [place] [address]   Blood Culture (routine x 2)     Status: None   Collection Time: 05/30/24 10:33 AM   Specimen: BLOOD RIGHT HAND  Result Value Ref Range Status   Specimen Description BLOOD RIGHT HAND  Final   Special Requests   Final    AEROBIC BOTTLE ONLY Blood Culture results may not be optimal due to an inadequate volume of blood received in culture bottles   Culture   Final    NO GROWTH 5 DAYS Performed at [facility], [address], [place], [place] [address]    Report Status 06/04/2024 FINAL  Final  Blood Culture (routine x 2)     Status: None   Collection Time: 05/30/24 10:49 AM   Specimen: BLOOD RIGHT ARM  Result Value Ref Range Status   Specimen Description BLOOD RIGHT ARM  Final   Special Requests   Final    AEROBIC BOTTLE ONLY Blood Culture results may not be optimal due to an inadequate volume of blood received in culture bottles   Culture   Final    NO GROWTH 5 DAYS Performed at [facility], [address] [place], [place] [address]    Report Status 06/04/2024 FINAL  Final         Radiology Studies: No results found.         LOS: 5 days   Time spent= 35 mins    Burgess JAYSON Dare, MD [facility]  If 7PM-7AM, please contact  night-coverage  06/05/2024, 11:33 AM  "

## 2024-06-05 NOTE — Plan of Care (Signed)
  Problem: Clinical Measurements: Goal: Ability to maintain clinical measurements within normal limits will improve Outcome: Progressing Goal: Will remain free from infection Outcome: Progressing Goal: Diagnostic test results will improve Outcome: Progressing Goal: Respiratory complications will improve Outcome: Progressing Goal: Cardiovascular complication will be avoided Outcome: Progressing   Problem: Health Behavior/Discharge Planning: Goal: Ability to manage health-related needs will improve Outcome: Progressing   Problem: Activity: Goal: Risk for activity intolerance will decrease Outcome: Progressing   Problem: Nutrition: Goal: Adequate nutrition will be maintained Outcome: Progressing   Problem: Coping: Goal: Level of anxiety will decrease Outcome: Progressing   Problem: Elimination: Goal: Will not experience complications related to bowel motility Outcome: Progressing Goal: Will not experience complications related to urinary retention Outcome: Progressing   Problem: Pain Managment: Goal: General experience of comfort will improve and/or be controlled Outcome: Progressing   Problem: Safety: Goal: Ability to remain free from injury will improve Outcome: Progressing   Problem: Skin Integrity: Goal: Risk for impaired skin integrity will decrease Outcome: Progressing

## 2024-06-05 NOTE — Progress Notes (Signed)
 Patient has been educated on the importance of turning and repositioning. He will allow minimal movement in bed but not full repositioning. Patient's family attempted to encourage him as well during her visit on Tuesday. Will continue to try.

## 2024-06-06 DIAGNOSIS — J101 Influenza due to other identified influenza virus with other respiratory manifestations: Secondary | ICD-10-CM | POA: Diagnosis not present

## 2024-06-06 LAB — CREATININE, SERUM
Creatinine, Ser: 0.66 mg/dL (ref 0.61–1.24)
GFR, Estimated: >60 mL/min

## 2024-06-06 MED ORDER — HYDROCODONE-ACETAMINOPHEN 5-325 MG PO TABS: 1.0000 | ORAL_TABLET | Freq: Three times a day (TID) | ORAL | 0 refills | Status: DC

## 2024-06-06 MED ORDER — BACLOFEN 20 MG PO TABS: 20.0000 mg | ORAL_TABLET | Freq: Three times a day (TID) | ORAL | 0 refills | Status: AC

## 2024-06-06 NOTE — Plan of Care (Signed)

## 2024-06-06 NOTE — Progress Notes (Signed)
 Called report for patient to return to [facility].

## 2024-06-06 NOTE — Discharge Summary (Signed)
 Physician Discharge Summary  YASSER HEPP FMW:[id number] DOB: 03/20/1941 DOA: 05/30/2024  PCP: Donnella Agreste  Admit date: 05/30/2024 Discharge date: 06/06/2024  Admitted From: Long-term care Disposition: LTC  Recommendations for Outpatient Follow-up:  Follow up with PCP in 1-2 weeks Please obtain BMP/CBC in one week your next doctors visit.    Discharge Condition: Stable CODE STATUS: DNR Diet recommendation: Heart healthy  Brief/Interim Summary: Brief Narrative:   84 year old with past medical history significant for heart failure preserved ejection fraction, CVA with residual deficits left-sided hemiparesis, hypertension, hyperlipidemia, multidrug-resistant UTI, BPH, recent admission from 1/1 through 1/7 for sepsis secondary to Proteus and Enterococcus acute cystitis with acute bilateral retention status post bilateral JJ stent by urology on 1/2 discharged on ciprofloxacin  to complete 7-day course through 1/13 per ID. Presented with worsening confusion, found to have failure to thrive and influenza A.   During the hospitalization patient did well and will be discharged in stable condition.  Rest of the details have been listed below.  Assessment & Plan:   Sepsis secondary to Influenza A, POA Acute Bronchitis and Aspiration.  Improved Failure to thrive; Adult - Completed Tamiflu , antibiotic and steroid course in the hospital. -Bronchodilators scheduled and as needed.  I-S/flutter valve   Acute metabolic encephalopathy: Improved Secondary to underlying infection.  Seems to be improving   Proteus Enterococcus acute cystitis with bilateral urinary retention, Pseudomonas bacteremia -Status post bilateral JJ stent per urology Dr. Elisabeth on 1/2.  ID recommended p.o. Cipro  until 1/13.  Completed course.  Outpatient follow-up.     History of CVA with residual deficits/left-sided hemiparesis -Continue Plavix , -Continue baclofen , gabapentin  and Zoloft    Compression fracture -Patient  was found to have L3 compression fracture 50% height loss -Pain management.  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 Respiratory effort normal. Cardiovascular system: S1 & S2 heard, RRR. No JVD, murmurs, rubs, gallops or clicks. No pedal edema. Gastrointestinal system: Abdomen is nondistended, soft and nontender. No organomegaly or masses felt. Normal bowel sounds heard. Central nervous system: Alert and oriented. No focal neurological deficits. Extremities: Hand deformity noted Skin: No rashes, lesions or ulcers Psychiatry: Judgement and insight appear normal. Mood & affect appropriate.    Discharge Diagnoses:  Principal Problem:   Influenza A      Discharge Exam: Vitals:   06/06/24 0756 06/06/24 0910  BP: (!) 161/69   Pulse: 70   Resp: 18   Temp: 98.3 F (36.8 C)   SpO2: 96% 98%   Vitals:   06/05/24 2257 06/06/24 0443 06/06/24 0756 06/06/24 0910  BP: (!) 163/65 132/79 (!) 161/69   Pulse: 93 95 70   Resp: 18 16 18    Temp: 98.1 F (36.7 C) 98.6 F (37 C) 98.3 F (36.8 C)   TempSrc: Axillary Axillary Oral  SpO2: 94% 95% 96% 98%      Discharge  Instructions   Allergies as of 06/06/2024   No Active Allergies      Medication List     STOP taking these medications    ciprofloxacin  750 MG tablet Commonly known as: CIPRO        TAKE these medications    acetaminophen  650 MG CR tablet Commonly known as: TYLENOL  Take 650 mg by mouth in the morning and at bedtime.   atorvastatin  40 MG tablet Commonly known as: LIPITOR Take 1 tablet (40 mg total) by mouth every evening.   baclofen  20 MG tablet Commonly known as: LIORESAL  Take 1 tablet (20 mg total) by mouth 3 (three) times daily.   bisacodyl  10 MG suppository Commonly known as: DULCOLAX Place 10 mg rectally daily as needed for moderate constipation.   calcium -vitamin D  500-5 MG-MCG tablet Commonly known as: OSCAL WITH D Take 1 tablet by mouth 2 (two) times daily.   clopidogrel  75 MG tablet Commonly known as: PLAVIX  Take 1 tablet (75 mg total) by mouth daily.   clotrimazole-betamethasone cream Commonly known as: LOTRISONE Apply 1 Application topically daily.   Cranberry 450 MG Tabs Take 450 mg by mouth daily.   Dextromethorphan -quiNIDine 20-10 MG capsule Commonly known as: NUEDEXTA  Take 1 capsule by mouth at bedtime. For Pseudobulbar Affect (PBA)   gabapentin  100 MG capsule Commonly known as: NEURONTIN  Take 200 mg by mouth 3 (three) times daily.   gentamicin ointment 0.1 % Commonly known as: GARAMYCIN Apply 1 Application topically daily.   HYDROcodone -acetaminophen  5-325 MG tablet Commonly known as: NORCO/VICODIN Take 1 tablet by mouth in the morning, at noon, and at bedtime.   ipratropium-albuterol  0.5-2.5 (3) MG/3ML Soln Commonly known as: DUONEB Take 3 mLs by nebulization in the morning, at noon, and at bedtime.   loperamide  2 MG tablet Commonly known as: IMODIUM  A-D Take 4 mg by mouth 4 (four) times daily as needed for diarrhea or loose stools.   loratadine  10 MG tablet Commonly known as: CLARITIN  Take 10 mg by mouth at bedtime.    LORazepam  0.5 MG tablet Commonly known as: ATIVAN  Take 0.5 mg by mouth daily as needed for anxiety.   nitroGLYCERIN  0.4 MG SL tablet Commonly known as: NITROSTAT  Place 0.4 mg under the tongue every 5 (five) minutes as needed for chest pain.   pantoprazole  20 MG tablet Commonly known as: PROTONIX  Take 20 mg by mouth daily.   phenazopyridine  100 MG tablet Commonly known as: PYRIDIUM  Take 100 mg by mouth every 8 (eight) hours as needed (bladder spasms).   polyethylene glycol 17 g packet Commonly known as: MIRALAX  / GLYCOLAX  Take 17 g by mouth daily as needed (constipation).   PreviDent 5000 Booster Plus 1.1 % Pste Generic drug: Sodium Fluoride Take 1 Application by mouth at bedtime.   Pro-Stat AWC Liqd Take 30 mLs by mouth 2 (two) times daily with a meal.   saccharomyces boulardii 250 MG capsule Commonly known as: FLORASTOR Take 250 mg by mouth every 12 (twelve) hours.   sertraline  100 MG tablet Commonly known as: ZOLOFT  Take 100 mg by mouth daily. Take with 25mg  Sertraline  tablet for a combined dose 125mg .   sertraline  25 MG tablet Commonly known as: ZOLOFT  Take 25 mg by mouth daily. Take with 100mg  Sertraline  tablet for a combined dose 125mg .   tamsulosin  0.4 MG Caps capsule Commonly known as: FLOMAX  Take 1 capsule (0.4 mg total) by mouth at bedtime.   trimethoprim  100 MG tablet  Commonly known as: TRIMPEX  Take 100 mg by mouth at bedtime. **No stop date** per [facility]   WesTab Plus 27-1 MG Tabs Take 1 tablet by mouth at bedtime.        Contact information for follow-up providers     [name]   Specialty: Skilled Nursing Facility Contact information: [address] [place] [address] [phone]              Contact information for after-discharge care     Destination     [facility] .   Service: Skilled Nursing Contact information: [address] [place]  [address] [phone]                     Allergies[1]  You were cared for by a hospitalist during your hospital stay. If you have any questions about your discharge medications or the care you received while you were in the hospital after you are discharged, you can call the unit and asked to speak with the hospitalist on call if the hospitalist that took care of you is not available. Once you are discharged, your primary care physician will handle any further medical issues. Please note that no refills for any discharge medications will be authorized once you are discharged, as it is imperative that you return to your primary care physician (or establish a relationship with a primary care physician if you do not have one) for your aftercare needs so that they can reassess your need for medications and monitor your lab values.  You were cared for by a hospitalist during your hospital stay. If you have any questions about your discharge medications or the care you received while you were in the hospital after you are discharged, you can call the unit and asked to speak with the hospitalist on call if the hospitalist that took care of you is not available. Once you are discharged, your primary care physician will handle any further medical issues. Please note that NO REFILLS for any discharge medications will be authorized once you are discharged, as it is imperative that you return to your primary care physician (or establish a relationship with a primary care physician if you do not have one) for your aftercare needs so that they can reassess your need for medications and monitor your lab values.  Please request your Prim.MD to go over all Hospital Tests and Procedure/Radiological results at the follow up, please get all Hospital records sent to your Prim MD by signing hospital release before you go home.  Get CBC, CMP, 2 view Chest X ray checked  by Primary MD during your next visit or SNF MD in 5-7 days ( we routinely  change or add medications that can affect your baseline labs and fluid status, therefore we recommend that you get the mentioned basic workup next visit with your PCP, your PCP may decide not to get them or add new tests based on their clinical decision)  On your next visit with your primary care physician please Get Medicines reviewed and adjusted.  If you experience worsening of your admission symptoms, develop shortness of breath, life threatening emergency, suicidal or homicidal thoughts you must seek medical attention immediately by calling 911 or calling your MD immediately  if symptoms less severe.  You Must read complete instructions/literature along with all the possible adverse reactions/side effects for all the Medicines you take and that have been prescribed to you. Take any new  Medicines after you have completely understood and accpet all the possible adverse reactions/side effects.   Do not drive, operate heavy machinery, perform activities at heights, swimming or participation in water  activities or provide baby sitting services if your were admitted for syncope or siezures until you have seen by Primary MD or a Neurologist and advised to do so again.  Do not drive when taking Pain medications.   Procedures/Studies: DG CHEST PORT 1 VIEW Result Date: 06/02/2024 EXAM: 1 VIEW(S) XRAY OF THE CHEST 06/02/2024 03:57:00 PM COMPARISON: 05/30/2024 CLINICAL HISTORY: Dyspnea FINDINGS: LUNGS AND PLEURA: Low lung volumes. Left basilar atelectasis. Large hiatal hernia with majority of stomach in left lung base, unchanged, with associated passive atelectasis. No pleural effusion. No pneumothorax. HEART AND MEDIASTINUM: Aortic atherosclerosis. No acute abnormality of the cardiac and mediastinal silhouettes. BONES AND SOFT TISSUES: No acute osseous abnormality. IMPRESSION: 1. Large hiatal hernia with majority of stomach in the left lung base with associated passive left basilar atelectasis. 2. Low lung  volumes. 3. Aortic atherosclerosis. Electronically signed by: Ryan Salvage MD MD 06/02/2024 05:03 PM EST RP Workstation: [id number]   CT Renal Stone Study Result Date: 05/30/2024 CLINICAL DATA:  Flank pain. EXAM: CT ABDOMEN AND PELVIS WITHOUT CONTRAST TECHNIQUE: Multidetector CT imaging of the abdomen and pelvis was performed following the standard protocol without IV contrast. RADIATION DOSE REDUCTION: This exam was performed according to the departmental dose-optimization program which includes automated exposure control, adjustment of the mA and/or kV according to patient size and/or use of iterative reconstruction technique. COMPARISON:  May 22, 2024 FINDINGS: Lower chest: No acute abnormality. Hepatobiliary: No focal liver abnormality is seen. Status post cholecystectomy. No biliary dilatation. Pancreas: Diffuse pancreatic atrophy is seen. No pancreatic ductal dilatation or surrounding inflammatory changes. Spleen: Normal in size without focal abnormality. Adrenals/Urinary Tract: Adrenal glands are unremarkable. Kidneys are normal in size without focal lesions. Properly positioned bilateral endo ureteral stents are seen. This represents a new finding when compared to the prior study. Numerous bilateral subcentimeter non-obstructing renal calculi are noted. A 3 mm ureteral calculus is seen within the mid left ureter (best seen on coronal reformatted image 50, CT series 6). A 2.6 cm urinary bladder calculus is seen within a poorly distended urinary bladder. Mild to moderate severity diffuse urinary bladder wall thickening is present with a mild amount of surrounding inflammatory fat stranding. Stomach/Bowel: There is a large, stable gastric hernia. The appendix is surgically absent. No evidence of bowel wall thickening, distention, or inflammatory changes. Vascular/Lymphatic: Aortic atherosclerosis. No enlarged abdominal or pelvic lymph nodes. Reproductive: There is moderate to marked severity prostate  gland enlargement. Other: A stable 5.4 cm x 5.3 cm right inguinal hernia is seen. This contains fat and normal appearing loops of distal ileum. A stable 4.1 cm x 5.9 cm fat containing left inguinal hernia is noted. No abdominopelvic ascites. Musculoskeletal: A chronic compression fracture deformity is seen at the level of L3 with multilevel degenerative changes present throughout the lumbar spine. IMPRESSION: 1. Properly positioned bilateral endo ureteral stents. 2. 3 mm mid left ureteral calculus. 3. Numerous bilateral subcentimeter non-obstructing renal calculi. 4. 2.6 cm urinary bladder calculus. 5. Mild to moderate severity diffuse urinary bladder wall thickening with a mild amount of surrounding inflammatory fat stranding. Correlation with urinalysis is recommended to exclude acute cystitis. 6. Large, stable gastric hernia. 7. Stable bilateral inguinal hernias, as described above. 8. Chronic compression fracture deformity at the level of L3. 9. Aortic atherosclerosis. Electronically Signed   By: Suzen  Houston M.D.   On: 05/30/2024 12:49   DG Finger Thumb Left Result Date: 05/30/2024 CLINICAL DATA:  Left thumb pain EXAM: LEFT THUMB 2+V COMPARISON:  None Available. FINDINGS: There is no evidence of fracture or dislocation. Minimal degenerative changes seen involving the first interphalangeal joint. Persistent flexion of this joint is noted as well. Soft tissues are unremarkable. IMPRESSION: No definite fracture or dislocation. Minimal degenerative change involving first interphalangeal joint with persistent flexion. Electronically Signed   By: Lynwood Landy Raddle M.D.   On: 05/30/2024 11:48   DG Chest Port 1 View Result Date: 05/30/2024 CLINICAL DATA:  Sepsis EXAM: PORTABLE CHEST 1 VIEW COMPARISON:  May 22, 2024 FINDINGS: Stable cardiomediastinal silhouette. Right lung is clear. Large left hiatal hernia is again noted. Minimal left basilar subsegmental atelectasis. Bony thorax is unremarkable. IMPRESSION:  Large left hiatal hernia. Minimal left basilar subsegmental atelectasis. Electronically Signed   By: Lynwood Landy Raddle M.D.   On: 05/30/2024 11:46   DG C-Arm 1-60 Min-No Report Result Date: 05/23/2024 Fluoroscopy was utilized by the requesting physician.  No radiographic interpretation.   CT Angio Chest PE W and/or Wo Contrast Result Date: 05/22/2024 EXAM: CTA CHEST PE WITHOUT AND WITH CONTRAST CT ABDOMEN AND PELVIS WITHOUT AND WITH CONTRAST 05/22/2024 10:47:02 PM TECHNIQUE: CTA of the chest was performed after the administration of 75 mL of intravenous contrast (iohexol  (OMNIPAQUE ) 350 MG/ML injection). Multiplanar reformatted images are provided for review. MIP images are provided for review. CT of the abdomen and pelvis was performed with and without the administration of intravenous contrast. Automated exposure control, iterative reconstruction, and/or weight based adjustment of the mA/kV was utilized to reduce the radiation dose to as low as reasonably achievable. COMPARISON: Findings were compared to prior studies from 2022 and 02/11/2022. CLINICAL HISTORY: Pulmonary embolism (PE) high prob. Nephrolithiasis, abdominal and flank pain, hyperbilirubinemia, pulmonary embolism. FINDINGS: CHEST: PULMONARY ARTERIES: Pulmonary arteries are adequately opacified for evaluation. No intraluminal filling defect to suggest pulmonary embolism. Main pulmonary artery is normal in caliber. MEDIASTINUM: Extensive multivessel coronary artery calcification. The heart and pericardium demonstrate no acute abnormality. 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 The descending thoracic aorta is of normal caliber. No mediastinal lymphadenopathy. LUNGS AND PLEURA: Large hiatal hernia containing the entire stomach which demonstrates organoaxial gastric volvulus, similar to prior examination, without obstruction. Resultant  marked elevation of the left hemidiaphragm, left basilar atelectasis and left-sided volume loss. Mild right basilar atelectasis. No pleural effusion or pneumothorax. SOFT TISSUES AND BONES: Osseous structures are age appropriate. No acute soft tissue abnormality. ABDOMEN AND PELVIS: LIVER: The liver is unremarkable. GALLBLADDER AND BILE DUCTS: Status post cholecystectomy. No biliary ductal dilatation. SPLEEN: Spleen demonstrates no acute abnormality. PANCREAS: Moderate fatty atrophy of the pancreas. There is increasing herniation of the proximal tail of the pancreas into the thoracic compartment. No evidence of superimposed inflammatory change, however. ADRENAL GLANDS: Adrenal glands demonstrate no acute abnormality. KIDNEYS, URETERS AND BLADDER: Mild asymmetric right renal cortical atrophy. Simple cortical cyst arises from the lower pole of the right kidney. No follow up imaging is recommended. Bilateral nonobstructing renal calculi are noted within the lower pole of the right kidney and within the renal pelvis bilaterally measuring up to 14 mm in the left renal pelvis and 16 mm within the right renal pelvis. No hydronephrosis. No ureteral calculi on the right. Nonobstructing 4 mm calculus within the mid left ureter at  the level of the crossing iliac vasculature. 27 mm calculus noted dependently within the bladder lumen. 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 The bladder is not distended. 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 No perinephric or periureteral stranding. GI AND BOWEL: Large right inguinal hernia contains the terminal ileum which appears unremarkable. Moderate fat-containing left inguinal hernia. No evidence of obstruction or focal inflammation. Status post appendectomy. The  duodenal sweep demonstrates no acute abnormality. There is no bowel obstruction. No abnormal bowel wall thickening or distension. 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 PERITONEUM AND RETROPERITONEUM: No ascites or free air. LYMPH NODES: No lymphadenopathy. BONES AND SOFT TISSUES: Subacute to remote-appearing anterior wedge compression deformity of L3 with 50% loss of height, new from prior examination. Osseous structures are otherwise age appropriate. Moderate aortoiliac atherosclerotic calcification. No focal soft tissue abnormality. LIMITATIONS/ARTIFACTS: Imaging is limited by respiratory motion artifact. IMPRESSION: 1. No pulmonary embolism. 2. 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 3. 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 Recommend annual imaging follow-up by CTA or mra. This recommendation follows 2010 accf/aha/aats/ACR/asa/sca/scai/sir/sts/svm guidelines for the diagnosis and management of patients with thoracic aortic disease. Circulation. 2010; 121: Z733-z630. Aortic aneurysm nos ([id number]) 4. Bilateral nonobstructing renal calculi, with a 4 mm nonobstructing calculus in the mid left ureter and a 27 mm calculus in the bladder lumen. 5. 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 6. Large right inguinal hernia containing the terminal ileum and moderate fat-containing left inguinal hernia, without obstruction or focal inflammation. 7. Subacute to remote-appearing anterior wedge compression deformity of L3 with 50% loss of height, new from prior examination. Correlation with clinical exam is recommended. If indicated, MRI examination will be  more helpful to better age the fracture. 8. Extensive multivessel coronary artery calcification. 9. Status post cholecystectomy and appendectomy. Electronically signed by: Dorethia Molt MD 05/22/2024 11:39 PM EST RP Workstation: [id number]   CT ABDOMEN PELVIS W CONTRAST Result Date: 05/22/2024 EXAM: CTA CHEST PE WITHOUT AND WITH CONTRAST CT ABDOMEN AND PELVIS WITHOUT AND WITH CONTRAST 05/22/2024 10:47:02 PM TECHNIQUE: CTA of the chest was performed after the administration of 75 mL of intravenous contrast (iohexol  (OMNIPAQUE ) 350 MG/ML injection). Multiplanar reformatted images are provided for review. MIP images are provided for review. CT of the abdomen and pelvis was performed with and without the administration of intravenous contrast. Automated exposure control, iterative reconstruction, and/or weight based adjustment of the mA/kV was utilized to reduce the radiation dose to as low as reasonably achievable. COMPARISON: Findings were compared to prior studies from 2022 and 02/11/2022. CLINICAL HISTORY: Pulmonary embolism (PE) high prob. Nephrolithiasis, abdominal and flank pain, hyperbilirubinemia, pulmonary embolism. FINDINGS: CHEST: PULMONARY ARTERIES: Pulmonary arteries are adequately opacified for evaluation. No intraluminal filling defect to suggest pulmonary embolism. Main pulmonary artery is normal in caliber. MEDIASTINUM: Extensive multivessel coronary artery calcification. The heart and pericardium demonstrate no acute abnormality. 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 The descending thoracic aorta is of normal caliber. No mediastinal lymphadenopathy. LUNGS AND PLEURA: Large hiatal hernia  containing the entire stomach which demonstrates organoaxial gastric volvulus, similar to prior examination, without obstruction. Resultant marked elevation of the left hemidiaphragm, left basilar  atelectasis and left-sided volume loss. Mild right basilar atelectasis. No pleural effusion or pneumothorax. SOFT TISSUES AND BONES: Osseous structures are age appropriate. No acute soft tissue abnormality. ABDOMEN AND PELVIS: LIVER: The liver is unremarkable. GALLBLADDER AND BILE DUCTS: Status post cholecystectomy. No biliary ductal dilatation. SPLEEN: Spleen demonstrates no acute abnormality. PANCREAS: Moderate fatty atrophy of the pancreas. There is increasing herniation of the proximal tail of the pancreas into the thoracic compartment. No evidence of superimposed inflammatory change, however. ADRENAL GLANDS: Adrenal glands demonstrate no acute abnormality. KIDNEYS, URETERS AND BLADDER: Mild asymmetric right renal cortical atrophy. Simple cortical cyst arises from the lower pole of the right kidney. No follow up imaging is recommended. Bilateral nonobstructing renal calculi are noted within the lower pole of the right kidney and within the renal pelvis bilaterally measuring up to 14 mm in the left renal pelvis and 16 mm within the right renal pelvis. No hydronephrosis. No ureteral calculi on the right. Nonobstructing 4 mm calculus within the mid left ureter at the level of the crossing iliac vasculature. 27 mm calculus noted dependently within the bladder lumen. 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 The bladder is not distended. 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 No perinephric or periureteral stranding. GI AND BOWEL: Large right inguinal hernia contains the terminal ileum which appears unremarkable. Moderate fat-containing left inguinal hernia. No evidence of obstruction or focal inflammation. Status post appendectomy. The duodenal sweep demonstrates no acute abnormality. There is  no bowel obstruction. No abnormal bowel wall thickening or distension. 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 PERITONEUM AND RETROPERITONEUM: No ascites or free air. LYMPH NODES: No lymphadenopathy. BONES AND SOFT TISSUES: Subacute to remote-appearing anterior wedge compression deformity of L3 with 50% loss of height, new from prior examination. Osseous structures are otherwise age appropriate. Moderate aortoiliac atherosclerotic calcification. No focal soft tissue abnormality. LIMITATIONS/ARTIFACTS: Imaging is limited by respiratory motion artifact. IMPRESSION: 1. No pulmonary embolism. 2. 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 3. 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 Recommend annual imaging follow-up by CTA or mra. This recommendation follows 2010 accf/aha/aats/ACR/asa/sca/scai/sir/sts/svm guidelines for the diagnosis and management of patients with thoracic aortic disease. Circulation. 2010; 121: Z733-z630. Aortic aneurysm nos ([id number]-i71.9) 4. Bilateral nonobstructing renal calculi, with a 4 mm nonobstructing calculus in the mid left ureter and a 27 mm calculus in the bladder lumen. 5. 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 6. Large right inguinal hernia containing the terminal ileum and moderate fat-containing left inguinal hernia, without obstruction or focal inflammation. 7. Subacute to remote-appearing anterior wedge compression deformity of L3 with 50% loss of height, new from prior examination. Correlation with clinical exam is recommended. If indicated, MRI examination will be more helpful to better age the fracture. 8. Extensive  multivessel coronary artery calcification. 9. Status post cholecystectomy and appendectomy. Electronically signed by: Dorethia Molt MD 05/22/2024 11:39 PM EST RP Workstation: [id number]   DG Chest Port 1 View Result Date: 05/22/2024 EXAM: 1  VIEW(S) XRAY OF THE CHEST 05/22/2024 08:51:07 PM COMPARISON: None available. CLINICAL HISTORY: Back pain. SOB (shortness of breath). FINDINGS: LUNGS AND PLEURA: Low lung volumes accentuate pulmonary vascularity. Elevated left hemidiaphragm. Left basilar atelectasis. No pleural effusion. No pneumothorax. HEART AND MEDIASTINUM: Low lung volumes accentuate the cardiomediastinal silhouette. Aortic atherosclerotic calcification is present. BONES AND SOFT TISSUES: Gaseous gastric distension. No acute osseous abnormality. IMPRESSION: 1. No acute cardiopulmonary abnormality. 2. Low lung volumes with left basilar atelectasis. 3. Large hiatal hernia with gaseous distention of the intrathoric stomach. Please correlate clinically. Electronically signed by: Greig Pique MD 05/22/2024 08:56 PM EST RP Workstation: [id number]     The results of significant diagnostics from this hospitalization (including imaging, microbiology, ancillary and laboratory) are listed below for reference.     Microbiology: Recent Results (from the past 240 hours)  Resp panel by RT-PCR (RSV, Flu A&B, Covid) Anterior Nasal Swab     Status: Abnormal   Collection Time: 05/30/24 10:22 AM   Specimen: Anterior Nasal Swab  Result Value Ref Range Status   SARS Coronavirus 2 by RT PCR NEGATIVE NEGATIVE Final   Influenza A by PCR POSITIVE (A) NEGATIVE Final   Influenza B by PCR NEGATIVE NEGATIVE Final    Comment: (NOTE) The Xpert Xpress SARS-CoV-2/FLU/RSV plus assay is intended as an aid in the diagnosis of influenza from Nasopharyngeal swab specimens and should not be used as a sole basis for treatment. Nasal washings and aspirates are unacceptable for Xpert Xpress SARS-CoV-2/FLU/RSV testing.  Fact  Sheet for Patients: bloggercourse.com  Fact Sheet for Healthcare Providers: seriousbroker.it  This test is not yet approved or cleared by the [place]  FDA and has been authorized for detection and/or diagnosis of SARS-CoV-2 by FDA under an Emergency Use Authorization (EUA). This EUA will remain in effect (meaning this test can be used) for the duration of the COVID-19 declaration under Section 564(b)(1) of the Act, 21 U.S.C. section 360bbb-3(b)(1), unless the authorization is terminated or revoked.     Resp Syncytial Virus by PCR NEGATIVE NEGATIVE Final    Comment: (NOTE) Fact Sheet for Patients: bloggercourse.com  Fact Sheet for Healthcare Providers: seriousbroker.it  This test is not yet approved or cleared by the [place]  FDA and has been authorized for detection and/or diagnosis of SARS-CoV-2 by FDA under an Emergency Use Authorization (EUA). This EUA will remain in effect (meaning this test can be used) for the duration of the COVID-19 declaration under Section 564(b)(1) of the Act, 21 U.S.C. section 360bbb-3(b)(1), unless the authorization is terminated or revoked.  Performed at [facility], [address] [place], [place] [address]   Blood Culture (routine x 2)     Status: None   Collection Time: 05/30/24 10:33 AM   Specimen: BLOOD RIGHT HAND  Result Value Ref Range Status   Specimen Description BLOOD RIGHT HAND  Final   Special Requests   Final    AEROBIC BOTTLE ONLY Blood Culture results may not be optimal due to an inadequate volume of blood received in culture bottles   Culture   Final    NO GROWTH 5 DAYS Performed at [facility], [address], [place], [place] [address]    Report Status 06/04/2024 FINAL  Final  Blood Culture (routine x 2)     Status: None   Collection Time: 05/30/24 10:49 AM   Specimen: BLOOD RIGHT ARM  Result Value  Ref Range Status   Specimen Description BLOOD RIGHT ARM  Final   Special  Requests   Final    AEROBIC BOTTLE ONLY Blood Culture results may not be optimal due to an inadequate volume of blood received in culture bottles   Culture   Final    NO GROWTH 5 DAYS Performed at [facility], [address] [place], [place] [address]    Report Status 06/04/2024 FINAL  Final     Labs: BNP (last 3 results) No results for input(s): BNP in the last 8760 hours. Basic Metabolic Panel: Recent Labs  Lab 05/30/24 1134 05/30/24 1501 05/31/24 0103 06/01/24 1238 06/05/24 0128 06/06/24 0636  NA 141  --  136 137 139  --   K 3.7  --  3.8 3.9 4.1  --   CL 107  --  104 105 106  --   CO2 22  --  22 21* 24  --   GLUCOSE 155*  --  95 165* 210*  --   BUN 18  --  17 17 20   --   CREATININE 0.85 0.73 0.67 0.81 0.69 0.66  CALCIUM  8.7*  --  8.1* 8.2* 8.7*  --   MG  --   --   --   --  1.9  --   PHOS  --   --   --   --  2.5  --    Liver Function Tests: Recent Labs  Lab 05/30/24 1134  AST 31  ALT 33  ALKPHOS 80  BILITOT 0.4  PROT 6.9  ALBUMIN 3.2*   No results for input(s): LIPASE, AMYLASE in the last 168 hours. No results for input(s): AMMONIA in the last 168 hours. CBC: Recent Labs  Lab 05/30/24 1501 05/31/24 0103 06/05/24 0128  WBC 8.3 7.8 7.1  HGB 10.7* 11.1* 11.3*  HCT 33.7* 34.2* 34.8*  MCV 93.4 91.0 89.7  PLT 154 162 270   Cardiac Enzymes: No results for input(s): CKTOTAL, CKMB, CKMBINDEX, TROPONINI in the last 168 hours. BNP: Invalid input(s): POCBNP CBG: No results for input(s): GLUCAP in the last 168 hours. D-Dimer No results for input(s): DDIMER in the last 72 hours. Hgb A1c No results for input(s): HGBA1C in the last 72 hours. Lipid Profile No results for input(s): CHOL, HDL, LDLCALC, TRIG, CHOLHDL, LDLDIRECT in the last 72 hours. Thyroid  function studies No results for input(s): TSH, T4TOTAL, T3FREE, THYROIDAB in the  last 72 hours.  Invalid input(s): FREET3 Anemia work up No results for input(s): VITAMINB12, FOLATE, FERRITIN, TIBC, IRON, RETICCTPCT in the last 72 hours. Urinalysis    Component Value Date/Time   COLORURINE YELLOW 05/30/2024 1044   APPEARANCEUR CLEAR 05/30/2024 1044   LABSPEC 1.025 05/30/2024 1044   PHURINE 5.5 05/30/2024 1044   GLUCOSEU NEGATIVE 05/30/2024 1044   HGBUR LARGE (A) 05/30/2024 1044   BILIRUBINUR NEGATIVE 05/30/2024 1044   KETONESUR NEGATIVE 05/30/2024 1044   PROTEINUR 100 (A) 05/30/2024 1044   UROBILINOGEN 1.0 09/22/2014 0726   NITRITE NEGATIVE 05/30/2024 1044   LEUKOCYTESUR SMALL (A) 05/30/2024 1044   Sepsis Labs Recent Labs  Lab 05/30/24 1501 05/31/24 0103 06/05/24 0128  WBC 8.3 7.8 7.1   Microbiology Recent Results (from the past 240 hours)  Resp panel by RT-PCR (RSV, Flu A&B, Covid) Anterior Nasal Swab     Status: Abnormal   Collection Time: 05/30/24 10:22 AM   Specimen: Anterior Nasal Swab  Result Value Ref Range Status   SARS Coronavirus 2 by RT PCR NEGATIVE NEGATIVE Final   Influenza A by PCR POSITIVE (A) NEGATIVE Final   Influenza  B by PCR NEGATIVE NEGATIVE Final    Comment: (NOTE) The Xpert Xpress SARS-CoV-2/FLU/RSV plus assay is intended as an aid in the diagnosis of influenza from Nasopharyngeal swab specimens and should not be used as a sole basis for treatment. Nasal washings and aspirates are unacceptable for Xpert Xpress SARS-CoV-2/FLU/RSV testing.  Fact Sheet for Patients: bloggercourse.com  Fact Sheet for Healthcare Providers: seriousbroker.it  This test is not yet approved or cleared by the [place]  FDA and has been authorized for detection and/or diagnosis of SARS-CoV-2 by FDA under an Emergency Use Authorization (EUA). This EUA will remain in effect (meaning this test can be used) for the duration of the COVID-19 declaration under Section 564(b)(1) of the Act,  21 U.S.C. section 360bbb-3(b)(1), unless the authorization is terminated or revoked.     Resp Syncytial Virus by PCR NEGATIVE NEGATIVE Final    Comment: (NOTE) Fact Sheet for Patients: bloggercourse.com  Fact Sheet for Healthcare Providers: seriousbroker.it  This test is not yet approved or cleared by the [place]  FDA and has been authorized for detection and/or diagnosis of SARS-CoV-2 by FDA under an Emergency Use Authorization (EUA). This EUA will remain in effect (meaning this test can be used) for the duration of the COVID-19 declaration under Section 564(b)(1) of the Act, 21 U.S.C. section 360bbb-3(b)(1), unless the authorization is terminated or revoked.  Performed at [facility], [address] [place], [place] [address]   Blood Culture (routine x 2)     Status: None   Collection Time: 05/30/24 10:33 AM   Specimen: BLOOD RIGHT HAND  Result Value Ref Range Status   Specimen Description BLOOD RIGHT HAND  Final   Special Requests   Final    AEROBIC BOTTLE ONLY Blood Culture results may not be optimal due to an inadequate volume of blood received in culture bottles   Culture   Final    NO GROWTH 5 DAYS Performed at [facility], [address], [place], [place] [address]    Report Status 06/04/2024 FINAL  Final  Blood Culture (routine x 2)     Status: None   Collection Time: 05/30/24 10:49 AM   Specimen: BLOOD RIGHT ARM  Result Value Ref Range Status   Specimen Description BLOOD RIGHT ARM  Final   Special Requests   Final    AEROBIC BOTTLE ONLY Blood Culture results may not be optimal due to an inadequate volume of blood received in culture bottles   Culture   Final    NO GROWTH 5 DAYS Performed at [facility], [address], [place], [place] [address]    Report Status 06/04/2024 FINAL  Final     Time coordinating discharge:  I have spent 35 minutes face to face with the patient and on  the ward discussing the patients care, assessment, plan and disposition with other care givers. >50% of the time was devoted counseling the patient about the risks and benefits of treatment/Discharge disposition and coordinating care.   SIGNED:   Burgess JAYSON Dare, MD  [facility] 06/06/2024, 10:47 AM   If 7PM-7AM, please contact night-coverage      [1] No Active Allergies

## 2024-06-06 NOTE — TOC Transition Note (Signed)
 Transition of Care [facility]) - Discharge Note   Patient Details  Name: Gregory Kerr MRN: [id number] Date of Birth: Jun 26, 1940  [facility] CM/SW Contact:  Almarie CHRISTELLA Goodie, LCSW Phone Number: 06/06/2024, 12:30 PM   Clinical Narrative:   CSW notified by [name] Hertz that they can admit patient today. CSW updated MD, patient is stable for discharge. Patient in agreement. CSW sent discharge information to [facility], confirmed receipt. Transport arranged with [facility] for next available.  Nurse to call report to [phone], Room 805.    Final next level of care: Skilled Nursing Facility Barriers to Discharge: Barriers Resolved   Patient Goals and CMS Choice            Discharge Placement              Patient chooses bed at: [facility] Patient to be transferred to facility by: [facility] Name of family member notified: Self Patient and family notified of of transfer: 06/06/24  Discharge Plan and Services Additional resources added to the After Visit Summary for       Post Acute Care Choice: Skilled Nursing Facility                               Social Drivers of Health (SDOH) Interventions SDOH Screenings   Food Insecurity: No Food Insecurity (05/31/2024)  Housing: Low Risk (05/31/2024)  Transportation Needs: No Transportation Needs (05/31/2024)  Utilities: Not At Risk (05/31/2024)  Social Connections: Socially Isolated (05/31/2024)  Tobacco Use: Medium Risk (05/23/2024)     Readmission Risk Interventions    02/15/2022   12:13 PM 10/13/2021   12:30 PM  Readmission Risk Prevention Plan  Transportation Screening Complete Complete  Medication Review Oceanographer) Complete Referral to Pharmacy  PCP or Specialist appointment within 3-5 days of discharge Complete Not Complete  PCP/Specialist Appt Not Complete comments  Still inpatient without discharge date at this time  HRI or Home Care Consult Complete Not Complete  HRI or Home Care Consult Pt  Refusal Comments  Patient is LTC at SNF  SW Recovery Care/Counseling Consult Complete Complete  Palliative Care Screening Complete Not Applicable  Skilled Nursing Facility Complete Complete

## 2024-06-06 NOTE — Progress Notes (Signed)
 Came to room for morning breathing treatment.  Noted pt w/ rhonchi.  Per pt, he just ate breakfast- chest vest held d/t this.  Flutter valve done instead.  Pt declines NTS currently- per pt he states NTS did not help him.    No distress currently noted.

## 2024-06-12 ENCOUNTER — Emergency Department (HOSPITAL_COMMUNITY)

## 2024-06-12 ENCOUNTER — Inpatient Hospital Stay (HOSPITAL_COMMUNITY)
Admission: EM | Admit: 2024-06-12 | Discharge: 2024-06-19 | DRG: 698 | Disposition: A | Discharge status: Skilled Nursing Facility | Source: Skilled Nursing Facility | Attending: Internal Medicine | Admitting: Internal Medicine

## 2024-06-12 ENCOUNTER — Encounter (HOSPITAL_COMMUNITY): Payer: Self-pay

## 2024-06-12 ENCOUNTER — Other Ambulatory Visit: Payer: Self-pay

## 2024-06-12 DIAGNOSIS — I69354 Hemiplegia and hemiparesis following cerebral infarction affecting left non-dominant side: Secondary | ICD-10-CM

## 2024-06-12 DIAGNOSIS — I10 Essential (primary) hypertension: Secondary | ICD-10-CM | POA: Diagnosis present

## 2024-06-12 DIAGNOSIS — I6930 Unspecified sequelae of cerebral infarction: Secondary | ICD-10-CM | POA: Diagnosis not present

## 2024-06-12 DIAGNOSIS — Z87891 Personal history of nicotine dependence: Secondary | ICD-10-CM

## 2024-06-12 DIAGNOSIS — N390 Urinary tract infection, site not specified: Secondary | ICD-10-CM

## 2024-06-12 DIAGNOSIS — B964 Proteus (mirabilis) (morganii) as the cause of diseases classified elsewhere: Secondary | ICD-10-CM | POA: Diagnosis present

## 2024-06-12 DIAGNOSIS — N3001 Acute cystitis with hematuria: Secondary | ICD-10-CM | POA: Diagnosis present

## 2024-06-12 DIAGNOSIS — Z87442 Personal history of urinary calculi: Secondary | ICD-10-CM

## 2024-06-12 DIAGNOSIS — R471 Dysarthria and anarthria: Secondary | ICD-10-CM | POA: Diagnosis present

## 2024-06-12 DIAGNOSIS — Z961 Presence of intraocular lens: Secondary | ICD-10-CM | POA: Diagnosis present

## 2024-06-12 DIAGNOSIS — G9340 Encephalopathy, unspecified: Secondary | ICD-10-CM | POA: Diagnosis present

## 2024-06-12 DIAGNOSIS — J690 Pneumonitis due to inhalation of food and vomit: Secondary | ICD-10-CM | POA: Diagnosis present

## 2024-06-12 DIAGNOSIS — M4856XA Collapsed vertebra, not elsewhere classified, lumbar region, initial encounter for fracture: Secondary | ICD-10-CM | POA: Diagnosis present

## 2024-06-12 DIAGNOSIS — R310 Gross hematuria: Secondary | ICD-10-CM | POA: Diagnosis not present

## 2024-06-12 DIAGNOSIS — R4182 Altered mental status, unspecified: Principal | ICD-10-CM

## 2024-06-12 DIAGNOSIS — Z8249 Family history of ischemic heart disease and other diseases of the circulatory system: Secondary | ICD-10-CM

## 2024-06-12 DIAGNOSIS — Z9841 Cataract extraction status, right eye: Secondary | ICD-10-CM | POA: Diagnosis not present

## 2024-06-12 DIAGNOSIS — Z7902 Long term (current) use of antithrombotics/antiplatelets: Secondary | ICD-10-CM

## 2024-06-12 DIAGNOSIS — E785 Hyperlipidemia, unspecified: Secondary | ICD-10-CM | POA: Diagnosis present

## 2024-06-12 DIAGNOSIS — N210 Calculus in bladder: Secondary | ICD-10-CM | POA: Diagnosis present

## 2024-06-12 DIAGNOSIS — I714 Abdominal aortic aneurysm, without rupture, unspecified: Secondary | ICD-10-CM | POA: Diagnosis present

## 2024-06-12 DIAGNOSIS — N200 Calculus of kidney: Secondary | ICD-10-CM | POA: Diagnosis present

## 2024-06-12 DIAGNOSIS — D649 Anemia, unspecified: Secondary | ICD-10-CM | POA: Diagnosis present

## 2024-06-12 DIAGNOSIS — N209 Urinary calculus, unspecified: Secondary | ICD-10-CM | POA: Diagnosis present

## 2024-06-12 DIAGNOSIS — T83592A Infection and inflammatory reaction due to indwelling ureteral stent, initial encounter: Secondary | ICD-10-CM | POA: Diagnosis present

## 2024-06-12 DIAGNOSIS — L89151 Pressure ulcer of sacral region, stage 1: Secondary | ICD-10-CM | POA: Diagnosis present

## 2024-06-12 DIAGNOSIS — R319 Hematuria, unspecified: Secondary | ICD-10-CM

## 2024-06-12 DIAGNOSIS — Z66 Do not resuscitate: Secondary | ICD-10-CM | POA: Diagnosis present

## 2024-06-12 DIAGNOSIS — Z79899 Other long term (current) drug therapy: Secondary | ICD-10-CM

## 2024-06-12 DIAGNOSIS — J189 Pneumonia, unspecified organism: Secondary | ICD-10-CM | POA: Diagnosis present

## 2024-06-12 DIAGNOSIS — Y732 Prosthetic and other implants, materials and accessory gastroenterology and urology devices associated with adverse incidents: Secondary | ICD-10-CM | POA: Diagnosis present

## 2024-06-12 DIAGNOSIS — N218 Other lower urinary tract calculus: Secondary | ICD-10-CM | POA: Diagnosis not present

## 2024-06-12 DIAGNOSIS — B952 Enterococcus as the cause of diseases classified elsewhere: Secondary | ICD-10-CM | POA: Diagnosis present

## 2024-06-12 DIAGNOSIS — Z9842 Cataract extraction status, left eye: Secondary | ICD-10-CM | POA: Diagnosis not present

## 2024-06-12 DIAGNOSIS — Z9049 Acquired absence of other specified parts of digestive tract: Secondary | ICD-10-CM

## 2024-06-12 DIAGNOSIS — G9341 Metabolic encephalopathy: Secondary | ICD-10-CM | POA: Diagnosis present

## 2024-06-12 DIAGNOSIS — N400 Enlarged prostate without lower urinary tract symptoms: Secondary | ICD-10-CM | POA: Diagnosis present

## 2024-06-12 LAB — COMPREHENSIVE METABOLIC PANEL WITH GFR
ALT: 23 U/L (ref 0–44)
AST: 32 U/L (ref 15–41)
Albumin: 3 g/dL — ABNORMAL LOW (ref 3.5–5.0)
Alkaline Phosphatase: 87 U/L (ref 38–126)
Anion gap: 10 (ref 5–15)
BUN: 27 mg/dL — ABNORMAL HIGH (ref 8–23)
CO2: 25 mmol/L (ref 22–32)
Calcium: 8.9 mg/dL (ref 8.9–10.3)
Chloride: 106 mmol/L (ref 98–111)
Creatinine, Ser: 0.91 mg/dL (ref 0.61–1.24)
GFR, Estimated: >60 mL/min
Glucose, Bld: 92 mg/dL (ref 70–99)
Potassium: 4.6 mmol/L (ref 3.5–5.1)
Sodium: 141 mmol/L (ref 135–145)
Total Bilirubin: 0.4 mg/dL (ref 0.0–1.2)
Total Protein: 6.4 g/dL — ABNORMAL LOW (ref 6.5–8.1)

## 2024-06-12 LAB — CBC WITH DIFFERENTIAL/PLATELET
Abs Immature Granulocytes: 0.06 K/uL (ref 0.00–0.07)
Basophils Absolute: 0.1 K/uL (ref 0.0–0.1)
Basophils Relative: 1 %
Eosinophils Absolute: 0.2 K/uL (ref 0.0–0.5)
Eosinophils Relative: 2 %
HCT: 39.8 % (ref 39.0–52.0)
Hemoglobin: 12.8 g/dL — ABNORMAL LOW (ref 13.0–17.0)
Immature Granulocytes: 1 %
Lymphocytes Relative: 14 %
Lymphs Abs: 1.3 K/uL (ref 0.7–4.0)
MCH: 29.3 pg (ref 26.0–34.0)
MCHC: 32.2 g/dL (ref 30.0–36.0)
MCV: 91.1 fL (ref 80.0–100.0)
Monocytes Absolute: 0.7 K/uL (ref 0.1–1.0)
Monocytes Relative: 8 %
Neutro Abs: 6.8 K/uL (ref 1.7–7.7)
Neutrophils Relative %: 74 %
Platelets: 214 K/uL (ref 150–400)
RBC: 4.37 MIL/uL (ref 4.22–5.81)
RDW: 13.9 % (ref 11.5–15.5)
WBC: 9.1 K/uL (ref 4.0–10.5)
nRBC: 0 % (ref 0.0–0.2)

## 2024-06-12 LAB — PROTIME-INR
INR: 1 (ref 0.8–1.2)
Prothrombin Time: 13.8 s (ref 11.4–15.2)

## 2024-06-12 LAB — URINALYSIS, W/ REFLEX TO CULTURE (INFECTION SUSPECTED)
RBC / HPF: >50 RBC/hpf (ref 0–5)
Squamous Epithelial / HPF: NONE SEEN /HPF (ref 0–5)
WBC, UA: >50 WBC/hpf (ref 0–5)

## 2024-06-12 LAB — AMMONIA: Ammonia: 65 umol/L — ABNORMAL HIGH (ref 9–35)

## 2024-06-12 LAB — I-STAT CG4 LACTIC ACID, ED: Lactic Acid, Venous: 0.9 mmol/L (ref 0.5–1.9)

## 2024-06-12 MED ORDER — SODIUM CHLORIDE 0.9% FLUSH
3.0000 mL | Freq: Two times a day (BID) | INTRAVENOUS | Status: DC
  Administered 2024-06-12 – 2024-06-18 (×13): 3 mL via INTRAVENOUS

## 2024-06-12 MED ORDER — SODIUM CHLORIDE 0.9 % IV BOLUS
1000.0000 mL | Freq: Once | INTRAVENOUS | Status: AC
  Administered 2024-06-12: 1000 mL via INTRAVENOUS

## 2024-06-12 MED ORDER — SENNOSIDES-DOCUSATE SODIUM 8.6-50 MG PO TABS: 1.0000 | ORAL_TABLET | Freq: Every evening | ORAL | Status: DC | PRN

## 2024-06-12 MED ORDER — PIPERACILLIN-TAZOBACTAM 3.375 G IVPB 30 MIN
3.3750 g | Freq: Once | INTRAVENOUS | Status: AC
  Administered 2024-06-12: 3.375 g via INTRAVENOUS
  Filled 2024-06-12: qty 50

## 2024-06-12 MED ORDER — ATORVASTATIN CALCIUM 40 MG PO TABS
40.0000 mg | ORAL_TABLET | Freq: Every evening | ORAL | Status: DC
  Administered 2024-06-13 – 2024-06-18 (×5): 40 mg via ORAL
  Filled 2024-06-12 (×5): qty 1

## 2024-06-12 MED ORDER — PIPERACILLIN-TAZOBACTAM 3.375 G IVPB
3.3750 g | Freq: Three times a day (TID) | INTRAVENOUS | Status: AC
  Administered 2024-06-13 – 2024-06-17 (×13): 3.375 g via INTRAVENOUS
  Filled 2024-06-12 (×13): qty 50

## 2024-06-12 MED ORDER — SODIUM CHLORIDE 0.9 % IV SOLN
1.0000 g | Freq: Once | INTRAVENOUS | Status: AC
  Administered 2024-06-12: 1 g via INTRAVENOUS
  Filled 2024-06-12: qty 10

## 2024-06-12 MED ORDER — ACETAMINOPHEN 650 MG RE SUPP: 650.0000 mg | Freq: Four times a day (QID) | RECTAL | Status: DC | PRN

## 2024-06-12 MED ORDER — ACETAMINOPHEN 325 MG PO TABS
650.0000 mg | ORAL_TABLET | Freq: Four times a day (QID) | ORAL | Status: DC | PRN
  Administered 2024-06-13 – 2024-06-19 (×2): 650 mg via ORAL
  Filled 2024-06-12 (×2): qty 2

## 2024-06-12 MED ORDER — SODIUM CHLORIDE 0.9 % IV SOLN: INTRAVENOUS | Status: DC

## 2024-06-12 MED ORDER — TAMSULOSIN HCL 0.4 MG PO CAPS
0.4000 mg | ORAL_CAPSULE | Freq: Every day | ORAL | Status: DC
  Administered 2024-06-13 – 2024-06-18 (×4): 0.4 mg via ORAL
  Filled 2024-06-12 (×6): qty 1

## 2024-06-12 NOTE — ED Triage Notes (Signed)
 Pt coming in from [facility] with altered mental status. Pt was discharged 1/7 with pneumonia and a UTI . Facility reporting altered mental status baseline alert and oriented x4. Pt is bedridden at baseline. Pt is now oriented x1 and following commands. Pt last known well 1pm . Staff reported uti last night no treatment. . Blood in urine today.   Ems vitals  111/78 Hr 67 nsr Spo2 90 on ra placed on 2 l 97%  Cbg 109

## 2024-06-12 NOTE — ED Provider Notes (Signed)
 " [place] [facility] Provider Note   CSN: [id number] Arrival date & time: 06/12/24  1428     Patient presents with: Altered Mental Status   Gregory Kerr is a 84 y.o. male who is DNR/DNI, presenting from his nursing facility with concern for altered mental status.  Patient was discharged from the hospital on January 16 my review of external records and hospital discharge summary.  He admitted at that time in early January for sepsis secondary to Proteus and Enterococcus UTI, with acute bilateral retention status post bilateral JJ stents by urology on January 2, discharged on ciprofloxacin  to complete a full course of general 13th per ID consult.  He Eilene presented back to the hospital last week with failure to thrive and influenza A.  He completed Tamiflu , in the hospital, as well as antibiotics and steroids.  He was noted to have acute metabolic encephalopathy secondary to an underlying infection which appeared to improve at the time of discharge.  He has a history of a stroke with residual left-sided hemiparesis for which he is on Plavix .  He was also found to have an L3 compression fracture.  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  He presents back to the ED by EMS with DNR form at the bedside.  EMS reports the patient had abrupt change in his mental status roughly over the past 24 hours according to facility staff.  They are concerned he may have another urinary infection.  He is not on any new antibiotics.  He was Trimpex  daily at bedside per last discharge for ppx  Last urine culture result from January 2 was noting Proteus and Enterococcus that have resistance to flora quinolones as well as nitrofurantoin .   HPI     Prior to Admission medications  Medication Sig Start Date End Date Taking? Authorizing Provider  acetaminophen  (TYLENOL ) 650 MG CR tablet Take 650 mg by mouth in the morning and at bedtime.    [provider]  Amino  Acids-Protein Hydrolys (PRO-STAT AWC) LIQD Take 30 mLs by mouth 2 (two) times daily with a meal.    [provider]  atorvastatin  (LIPITOR) 40 MG tablet Take 1 tablet (40 mg total) by mouth every evening. 05/28/24   Rashid, Farhan, MD  baclofen  (LIORESAL ) 20 MG tablet Take 1 tablet (20 mg total) by mouth 3 (three) times daily. 06/06/24   Amin, Ankit C, MD  bisacodyl  (DULCOLAX) 10 MG suppository Place 10 mg rectally daily as needed for moderate constipation. 10/18/21   [provider]  calcium -vitamin D  (OSCAL WITH D) 500-5 MG-MCG tablet Take 1 tablet by mouth 2 (two) times daily.    [provider]  clopidogrel  (PLAVIX ) 75 MG tablet Take 1 tablet (75 mg total) by mouth daily. 11/22/20   Golda Lynwood PARAS, MD  clotrimazole-betamethasone (LOTRISONE) cream Apply 1 Application topically daily. 05/20/24   [provider]  Cranberry 450 MG TABS Take 450 mg by mouth daily.    [provider]  Dextromethorphan -quiNIDine (NUEDEXTA ) 20-10 MG capsule Take 1 capsule by mouth at bedtime. For Pseudobulbar Affect (PBA)    [provider]  gabapentin  (NEURONTIN ) 100 MG capsule Take 200 mg by mouth 3 (three) times daily.    [provider]  gentamicin ointment (GARAMYCIN) 0.1 % Apply 1 Application topically daily.    [provider]  HYDROcodone -acetaminophen  (NORCO/VICODIN) 5-325 MG tablet Take 1 tablet by mouth in the morning, at noon, and  at bedtime. 06/06/24   Amin, Ankit C, MD  loperamide  (IMODIUM  A-D) 2 MG tablet Take 4 mg by mouth 4 (four) times daily as needed for diarrhea or loose stools.    [provider]  loratadine  (CLARITIN ) 10 MG tablet Take 10 mg by mouth at bedtime.    [provider]  LORazepam  (ATIVAN ) 0.5 MG tablet Take 0.5 mg by mouth daily as needed for anxiety.    [provider]  nitroGLYCERIN  (NITROSTAT ) 0.4 MG SL tablet Place 0.4 mg under the tongue every 5 (five) minutes as needed for chest pain.     [provider]  pantoprazole  (PROTONIX ) 20 MG tablet Take 20 mg by mouth daily. 11/17/20   [provider]  phenazopyridine  (PYRIDIUM ) 100 MG tablet Take 100 mg by mouth every 8 (eight) hours as needed (bladder spasms).    [provider]  polyethylene glycol (MIRALAX  / GLYCOLAX ) packet Take 17 g by mouth daily as needed (constipation).     [provider]  Prenatal Vit-Fe Fumarate-FA (WESTAB PLUS) 27-1 MG TABS Take 1 tablet by mouth at bedtime.    [provider]  PREVIDENT 5000 BOOSTER PLUS 1.1 % PSTE Take 1 Application by mouth at bedtime. 03/10/24   [provider]  saccharomyces boulardii (FLORASTOR) 250 MG capsule Take 250 mg by mouth every 12 (twelve) hours.    [provider]  sertraline  (ZOLOFT ) 100 MG tablet Take 100 mg by mouth daily. Take with 25mg  Sertraline  tablet for a combined dose 125mg .    [provider]  sertraline  (ZOLOFT ) 25 MG tablet Take 25 mg by mouth daily. Take with 100mg  Sertraline  tablet for a combined dose 125mg .    [provider]  tamsulosin  (FLOMAX ) 0.4 MG CAPS capsule Take 1 capsule (0.4 mg total) by mouth at bedtime. 11/21/20   Golda Lynwood PARAS, MD  trimethoprim  (TRIMPEX ) 100 MG tablet Take 100 mg by mouth at bedtime. **No stop date** per [facility] 02/03/22   [provider]    Allergies: Patient has no active allergies.    Review of Systems  Updated Vital Signs BP (!) 120/51   Pulse 88   Temp 98.2 F (36.8 C) (Oral)   Resp 15   Ht 5' 6 (1.676 m)   Wt 78 kg   SpO2 97%   BMI 27.75 kg/m   Physical Exam Constitutional:      Comments: Appears lethargic, snoring  HENT:     Head: Normocephalic and atraumatic.  Eyes:     Conjunctiva/sclera: Conjunctivae normal.     Pupils: Pupils are equal, round, and reactive to light.  Cardiovascular:     Rate and Rhythm: Normal rate and regular rhythm.  Pulmonary:     Effort: Pulmonary effort is normal. No respiratory distress.   Abdominal:     General: There is no distension.     Tenderness: There is no abdominal tenderness.  Skin:    General: Skin is warm and dry.  Neurological:     Comments: Contractured left side upper and lower extremity consistent with known left-sided hemiparesis from prior stroke Patient opens his eyes and withdraws to pain, nonverbal     (all labs ordered are listed, but only abnormal results are displayed) Labs Reviewed  COMPREHENSIVE METABOLIC PANEL WITH GFR - Abnormal; Notable for the following components:      Result Value   BUN 27 (*)    Total Protein 6.4 (*)    Albumin 3.0 (*)    All other  components within normal limits  CBC WITH DIFFERENTIAL/PLATELET - Abnormal; Notable for the following components:   Hemoglobin 12.8 (*)    All other components within normal limits  URINALYSIS, W/ REFLEX TO CULTURE (INFECTION SUSPECTED) - Abnormal; Notable for the following components:   Color, Urine RED (*)    APPearance   (*)    Value: TEST NOT REPORTED DUE TO COLOR INTERFERENCE OF URINE PIGMENT   Glucose, UA   (*)    Value: TEST NOT REPORTED DUE TO COLOR INTERFERENCE OF URINE PIGMENT   Hgb urine dipstick   (*)    Value: TEST NOT REPORTED DUE TO COLOR INTERFERENCE OF URINE PIGMENT   Bilirubin Urine   (*)    Value: TEST NOT REPORTED DUE TO COLOR INTERFERENCE OF URINE PIGMENT   Ketones, ur   (*)    Value: TEST NOT REPORTED DUE TO COLOR INTERFERENCE OF URINE PIGMENT   Protein, ur   (*)    Value: TEST NOT REPORTED DUE TO COLOR INTERFERENCE OF URINE PIGMENT   Nitrite   (*)    Value: TEST NOT REPORTED DUE TO COLOR INTERFERENCE OF URINE PIGMENT   Leukocytes,Ua   (*)    Value: TEST NOT REPORTED DUE TO COLOR INTERFERENCE OF URINE PIGMENT   Bacteria, UA MANY (*)    All other components within normal limits  AMMONIA - Abnormal; Notable for the following components:   Ammonia 65 (*)    All other components within normal limits  CULTURE, BLOOD (ROUTINE X 2)  CULTURE, BLOOD (ROUTINE X 2)   URINE CULTURE  PROTIME-INR  I-STAT CG4 LACTIC ACID, ED    EKG: EKG Interpretation Date/Time:  Thursday June 12 2024 17:32:58 EST Ventricular Rate:  71 PR Interval:  159 QRS Duration:  99 QT Interval:  400 QTC Calculation: 435 R Axis:   23  Text Interpretation: Sinus rhythm Nonspecific T abnormalities, lateral leads Confirmed by Cottie Cough [phone]) on 06/12/2024 5:38:34 PM  Radiology: CT Head Wo Contrast Result Date: 06/12/2024 EXAM: CT HEAD WITHOUT CONTRAST 06/12/2024 04:20:33 PM TECHNIQUE: CT of the head was performed without the administration of intravenous contrast. Automated exposure control, iterative reconstruction, and/or weight based adjustment of the mA/kV was utilized to reduce the radiation dose to as low as reasonably achievable. COMPARISON: None available. CLINICAL HISTORY: Mental status change, unknown cause Mental status change, unknown cause. FINDINGS: BRAIN AND VENTRICLES: No acute hemorrhage. No evidence of acute infarct. No hydrocephalus. No extra-axial collection. No mass effect or midline shift. Similar remote right thalamic lacunar infarct and cerebral atrophy. ORBITS: No acute abnormality. SINUSES: No acute abnormality. SOFT TISSUES AND SKULL: No acute soft tissue abnormality. No skull fracture. IMPRESSION: 1. No acute intracranial abnormality. Electronically signed by: Glendia Molt MD 06/12/2024 04:39 PM EST RP Workstation: [id number]   DG Chest Port 1 View Result Date: 06/12/2024 CLINICAL DATA:  Questionable sepsis. EXAM: PORTABLE CHEST 1 VIEW COMPARISON:  Chest radiograph dated 06/02/2024. FINDINGS: Stable cardiomegaly with mild vascular congestion. No focal consolidation, pleural effusion or pneumothorax. Large hiatal hernia. No acute osseous pathology. IMPRESSION: 1. Cardiomegaly with mild vascular congestion. No focal consolidation. 2. Large hiatal hernia. Electronically Signed   By: Vanetta Chou M.D.   On: 06/12/2024 15:44     Procedures    Medications Ordered in the ED  sodium chloride  0.9 % bolus 1,000 mL (0 mLs Intravenous Stopped 06/12/24 1948)  cefTRIAXone  (ROCEPHIN ) 1 g in sodium chloride  0.9 % 100 mL IVPB (0 g Intravenous Stopped 06/12/24 1757)  Clinical Course as of 06/12/24 2131  Thu Jun 12, 2024  2025 Reportedly grossly bloody UA on cath [MT]    Clinical Course User Index [MT] Ferris Tally, Donnice PARAS, MD                                 Medical Decision Making Amount and/or Complexity of Data Reviewed Labs: ordered. Radiology: ordered.  Risk Decision regarding hospitalization.   This patient presents to the Emergency Department with complaint of altered mental status.  This involves an extensive number of treatment options, and is a complaint that carries with it a high risk of complications and morbidity.  The differential diagnosis includes hypoglycemia vs metabolic encephalopathy vs infection (including cystitis) vs ICH vs stroke vs polypharmacy vs other  Of note, patient was acutely ill with influenza on 1/9, 13 days ago, and is no longer considered contagious with this illness.  I ordered, reviewed, and interpreted labs, including lactate and white blood cell count within normal limits.  Ammonia mildly elevated.  Creatinine within normal limits.  UA is grossly bloody. I ordered medication fluids and antibiotics for suspected dehydration and urinary tract infection I ordered imaging studies which included CT head, x-ray of chest I independently visualized and interpreted imaging which showed no emergent findings and the monitor tracing which showed [place] rhythm Additional history was obtained from EMS Previous records obtained and reviewed showing most recent urine culture results from January 2 as well as most recent hospital discharge summary from last week. I personally reviewed the patients ECG which showed sinus rhythm with no acute ischemic findings  No clear evidence of sepsis at this  time.  Patient is DNR  After the interventions stated above, I reevaluated the patient and found medical admission      Final diagnoses:  Altered mental status, unspecified altered mental status type  Urinary tract infection with hematuria, site unspecified    ED Discharge Orders     None          Cottie Donnice PARAS, MD 06/12/24 2131  "

## 2024-06-12 NOTE — ED Notes (Signed)
 Called CCMD at [phone] to initiate cardiac monitoring as ordered.

## 2024-06-12 NOTE — H&P (Signed)
 " History and Physical    Gregory Kerr FMW:[id number] DOB: 05-Jul-1940 DOA: 06/12/2024  PCP: Donnella Agreste   Patient coming from: SNF   Chief Complaint: Malodorous and bloody urine, AMS   HPI: Gregory Kerr is an 84 y.o. male with medical history significant for CVA with left-sided hemiparesis, BPH, and recent admissions for influenza, aspiration, Proteus and enterococcal UTI, and pseudomonal bacteremia who returns with altered mental status and malodorous bloody urine.  Patient is unable to provide much history.  He has reportedly been disoriented today and was noted to have bloody and foul-smelling urine suspicious for recurrent UTI.  Bilateral ureteral stents were placed on 05/23/2025.  He had been seen by ID during the recent hospitalization and completed a course of ciprofloxacin  after discharge.  ED Course: Upon arrival to the ED, patient is found to be afebrile and saturating 100% on 5 L/min of supplemental oxygen with normal HR and stable BP.  Labs are most notable for elevated BUN to creatinine ratio, normal WBC, and normal lactate.  Blood and urine cultures were collected in the ED and the patient was given a liter of NS and Rocephin .  Review of Systems:  All other systems reviewed and apart from HPI, are negative.  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   Pneumonia    once or twice (09/22/2014)   Retinal detachment    left eye   Stroke [facility]) 2007   left side doesn't work now (09/22/2014)    Past Surgical History:  Procedure Laterality Date   APPENDECTOMY  05/27/2009   Jeoffrey Dawn, MD   CATARACT EXTRACTION W/ INTRAOCULAR LENS  IMPLANT, BILATERAL Bilateral ?2014   CHOLECYSTECTOMY N/A  09/26/2014   Procedure: LAPAROSCOPIC CHOLECYSTECTOMY WITH INTRAOPERATIVE CHOLANGIOGRAM;  Surgeon: Donnice Lima, MD;  Location: [facility] OR;  Service: General;  Laterality: N/A;   COLONOSCOPY WITH PROPOFOL  N/A 12/29/2015   Procedure: COLONOSCOPY WITH PROPOFOL ;  Surgeon: Jerrell Sol, MD;  Location: [facility] ENDOSCOPY;  Service: Endoscopy;  Laterality: N/A;   CYSTOSCOPY W/ URETERAL STENT PLACEMENT Bilateral 05/23/2024   Procedure: CYSTOSCOPY WITH LEFT RETROGRADE PYELOGRAM AND BILATARAL URETERAL STENT INSERTION;  Surgeon: Elisabeth Valli BIRCH, MD;  Location: [facility] OR;  Service: Urology;  Laterality: Bilateral;   ERCP N/A 09/25/2014   Procedure: ENDOSCOPIC RETROGRADE CHOLANGIOPANCREATOGRAPHY (ERCP);  Surgeon: Oliva Boots, MD;  Location: [facility] ENDOSCOPY;  Service: Endoscopy;  Laterality: N/A;   IR CATHETER TUBE CHANGE  05/24/2021   PARS PLANA VITRECTOMY Left 01/30/2019   Procedure: PARS PLANA VITRECTOMY WITH 25 GAUGE;  Surgeon: Jarold Mayo, MD;  Location: [facility] OR;  Service: Ophthalmology;  Laterality: Left;   PARS PLANA VITRECTOMY Right 11/13/2019   Procedure: PARS PLANA VITRECTOMY WITH 25 GAUGE WITH REMOVAL OF POSTERIOR CAPSULAR OPACIFICATION WITH VITRECTOR;  Surgeon: Jarold Mayo, MD;  Location: [facility] OR;  Service: Ophthalmology;  Laterality: Right;   PHOTOCOAGULATION WITH LASER Left 01/30/2019   Procedure: PHOTOCOAGULATION WITH LASER;  Surgeon: Jarold Mayo, MD;  Location: [facility] OR;  Service: Ophthalmology;  Laterality: Left;    Social History:   reports that he quit smoking about 18 years ago. His smoking use included cigarettes. He smoked an average of 1 pack per day. He has never used smokeless tobacco. He  reports that he does not currently use alcohol . He reports that he does not use drugs.  Allergies[1]  Family History  Problem Relation Age of Onset   Heart attack Mother    Heart attack Father    Cancer Maternal Aunt        Stomach   Cancer Paternal Wylie Kennyth Proud     Prior to Admission medications   Medication Sig Start Date End Date Taking? Authorizing Provider  acetaminophen  (TYLENOL ) 650 MG CR tablet Take 650 mg by mouth in the morning and at bedtime.   Yes [provider]  atorvastatin  (LIPITOR) 40 MG tablet Take 1 tablet (40 mg total) by mouth every evening. Patient taking differently: Take 40 mg by mouth at bedtime. 05/28/24  Yes Rashid, Farhan, MD  baclofen  (LIORESAL ) 20 MG tablet Take 1 tablet (20 mg total) by mouth 3 (three) times daily. 06/06/24  Yes Amin, Ankit C, MD  bisacodyl  (DULCOLAX) 10 MG suppository Place 10 mg rectally daily as needed for moderate constipation. 10/18/21  Yes [provider]  calcium -vitamin D  (OSCAL WITH D) 500-5 MG-MCG tablet Take 1 tablet by mouth 2 (two) times daily.   Yes [provider]  clopidogrel  (PLAVIX ) 75 MG tablet Take 1 tablet (75 mg total) by mouth daily. 11/22/20  Yes Golda Lynwood PARAS, MD  clotrimazole-betamethasone (LOTRISONE) cream Apply 1 Application topically daily. 05/20/24  Yes [provider]  Cranberry 450 MG TABS Take 450 mg by mouth daily.   Yes [provider]  Dextromethorphan -quiNIDine (NUEDEXTA ) 20-10 MG capsule Take 1 capsule by mouth at bedtime. For Pseudobulbar Affect (PBA)   Yes [provider]  gabapentin  (NEURONTIN ) 100 MG capsule Take 200 mg by mouth 3 (three) times daily.   Yes [provider]  gentamicin ointment (GARAMYCIN) 0.1 % Apply 1 Application topically daily.   Yes [provider]  HYDROcodone -acetaminophen  (NORCO/VICODIN) 5-325 MG tablet Take 1 tablet by mouth in the morning, at noon, and at bedtime. 06/06/24  Yes Amin, Ankit C, MD  ipratropium-albuterol  (DUONEB) 0.5-2.5 (3) MG/3ML SOLN Inhale 3 mLs into the lungs in the morning, at noon, and at bedtime.   Yes [provider]  loperamide  (IMODIUM  A-D) 2 MG tablet Take 4 mg by mouth 4 (four) times daily as needed for diarrhea or loose stools.   Yes [provider]  loratadine   (CLARITIN ) 10 MG tablet Take 10 mg by mouth at bedtime.   Yes [provider]  LORazepam  (ATIVAN ) 0.5 MG tablet Take 0.5 mg by mouth daily as needed for anxiety.  06/20/24 Yes [provider]  nitroGLYCERIN  (NITROSTAT ) 0.4 MG SL tablet Place 0.4 mg under the tongue every 5 (five) minutes as needed for chest pain.   Yes [provider]  pantoprazole  (PROTONIX ) 20 MG tablet Take 20 mg by mouth daily. 11/17/20  Yes [provider]  phenazopyridine  (PYRIDIUM ) 100 MG tablet Take 100 mg by mouth every 8 (eight) hours as needed (bladder spasms).   Yes [provider]  polyethylene glycol (MIRALAX  / GLYCOLAX ) packet Take 17 g by mouth daily as needed (constipation).    Yes [provider]  Amino Acids-Protein Hydrolys (PRO-STAT AWC) LIQD Take 30 mLs by mouth 2 (two) times daily with a meal.    [provider]  Prenatal Vit-Fe Fumarate-FA (WESTAB PLUS) 27-1 MG TABS Take 1 tablet by mouth at bedtime.    [provider]  PREVIDENT 5000 BOOSTER PLUS 1.1 % PSTE Take  1 Application by mouth at bedtime. 03/10/24   [provider]  saccharomyces boulardii (FLORASTOR) 250 MG capsule Take 250 mg by mouth every 12 (twelve) hours.    [provider]  sertraline  (ZOLOFT ) 100 MG tablet Take 100 mg by mouth daily. Take with 25mg  Sertraline  tablet for a combined dose 125mg .    [provider]  sertraline  (ZOLOFT ) 25 MG tablet Take 25 mg by mouth daily. Take with 100mg  Sertraline  tablet for a combined dose 125mg .    [provider]  tamsulosin  (FLOMAX ) 0.4 MG CAPS capsule Take 1 capsule (0.4 mg total) by mouth at bedtime. 11/21/20   Golda Lynwood PARAS, MD  trimethoprim  (TRIMPEX ) 100 MG tablet Take 100 mg by mouth at bedtime. **No stop date** per [facility] 02/03/22   [provider]    Physical Exam: Vitals:   06/12/24 2230 06/12/24 2251 06/12/24 2300 06/13/24 0000  BP: 130/83  (!) 163/67 (!) 142/59  Pulse: 84  84 79   Resp: 13  19 20   Temp:  99.2 F (37.3 C)    TempSrc:  Axillary    SpO2: 99%  97% 98%  Weight:      Height:         Constitutional: NAD, no diaphoresis  Eyes: PERTLA, lids and conjunctivae normal ENMT: Mucous membranes are dry. Posterior pharynx clear of any exudate or lesions.   Neck: supple, no masses  Respiratory: No wheezing. No accessory muscle use.  Cardiovascular: S1 & S2 heard, regular rate and rhythm. Trace lower extremity edema.  Abdomen: No tenderness, soft. Bowel sounds active.  Musculoskeletal: no clubbing / cyanosis. Flexion contractures.   Neurologic: Awake. Oriented to person only.   Psychiatric: Calm. Cooperative.    Labs and Imaging on Admission: I have personally reviewed following labs and imaging studies  CBC: Recent Labs  Lab 06/12/24 1517  WBC 9.1  NEUTROABS 6.8  HGB 12.8*  HCT 39.8  MCV 91.1  PLT 214   Basic Metabolic Panel: Recent Labs  Lab 06/06/24 0636 06/12/24 1517  NA  --  141  K  --  4.6  CL  --  106  CO2  --  25  GLUCOSE  --  92  BUN  --  27*  CREATININE 0.66 0.91  CALCIUM   --  8.9   GFR: Estimated Creatinine Clearance: 62.6 mL/min (by C-G formula based on SCr of 0.91 mg/dL). Liver Function Tests: Recent Labs  Lab 06/12/24 1517  AST 32  ALT 23  ALKPHOS 87  BILITOT 0.4  PROT 6.4*  ALBUMIN 3.0*   No results for input(s): LIPASE, AMYLASE in the last 168 hours. Recent Labs  Lab 06/12/24 1652  AMMONIA 65*   Coagulation Profile: Recent Labs  Lab 06/12/24 1517  INR 1.0   Cardiac Enzymes: No results for input(s): CKTOTAL, CKMB, CKMBINDEX, TROPONINI in the last 168 hours. BNP (last 3 results) No results for input(s): PROBNP in the last 8760 hours. HbA1C: No results for input(s): HGBA1C in the last 72 hours. CBG: No results for input(s): GLUCAP in the last 168 hours. Lipid Profile: No results for input(s): CHOL, HDL, LDLCALC, TRIG, CHOLHDL, LDLDIRECT in the last 72 hours. Thyroid   Function Tests: No results for input(s): TSH, T4TOTAL, FREET4, T3FREE, THYROIDAB in the last 72 hours. Anemia Panel: No results for input(s): VITAMINB12, FOLATE, FERRITIN, TIBC, IRON, RETICCTPCT in the last 72 hours. Urine analysis:    Component Value Date/Time   COLORURINE RED (A) 06/12/2024 1945   APPEARANCEUR (A) 06/12/2024 1945  TEST NOT REPORTED DUE TO COLOR INTERFERENCE OF URINE PIGMENT   LABSPEC  06/12/2024 1945    TEST NOT REPORTED DUE TO COLOR INTERFERENCE OF URINE PIGMENT   PHURINE  06/12/2024 1945    TEST NOT REPORTED DUE TO COLOR INTERFERENCE OF URINE PIGMENT   GLUCOSEU (A) 06/12/2024 1945    TEST NOT REPORTED DUE TO COLOR INTERFERENCE OF URINE PIGMENT   HGBUR (A) 06/12/2024 1945    TEST NOT REPORTED DUE TO COLOR INTERFERENCE OF URINE PIGMENT   BILIRUBINUR (A) 06/12/2024 1945    TEST NOT REPORTED DUE TO COLOR INTERFERENCE OF URINE PIGMENT   KETONESUR (A) 06/12/2024 1945    TEST NOT REPORTED DUE TO COLOR INTERFERENCE OF URINE PIGMENT   PROTEINUR (A) 06/12/2024 1945    TEST NOT REPORTED DUE TO COLOR INTERFERENCE OF URINE PIGMENT   UROBILINOGEN 1.0 09/22/2014 0726   NITRITE (A) 06/12/2024 1945    TEST NOT REPORTED DUE TO COLOR INTERFERENCE OF URINE PIGMENT   LEUKOCYTESUR (A) 06/12/2024 1945    TEST NOT REPORTED DUE TO COLOR INTERFERENCE OF URINE PIGMENT   Sepsis Labs: @LABRCNTIP (procalcitonin:4,lacticidven:4) )No results found for this or any previous visit (from the past 240 hours).   Radiological Exams on Admission: CT Head Wo Contrast Result Date: 06/12/2024 EXAM: CT HEAD WITHOUT CONTRAST 06/12/2024 04:20:33 PM TECHNIQUE: CT of the head was performed without the administration of intravenous contrast. Automated exposure control, iterative reconstruction, and/or weight based adjustment of the mA/kV was utilized to reduce the radiation dose to as low as reasonably achievable. COMPARISON: None available. CLINICAL HISTORY: Mental status change,  unknown cause Mental status change, unknown cause. FINDINGS: BRAIN AND VENTRICLES: No acute hemorrhage. No evidence of acute infarct. No hydrocephalus. No extra-axial collection. No mass effect or midline shift. Similar remote right thalamic lacunar infarct and cerebral atrophy. ORBITS: No acute abnormality. SINUSES: No acute abnormality. SOFT TISSUES AND SKULL: No acute soft tissue abnormality. No skull fracture. IMPRESSION: 1. No acute intracranial abnormality. Electronically signed by: Glendia Molt MD 06/12/2024 04:39 PM EST RP Workstation: [id number]   DG Chest Port 1 View Result Date: 06/12/2024 CLINICAL DATA:  Questionable sepsis. EXAM: PORTABLE CHEST 1 VIEW COMPARISON:  Chest radiograph dated 06/02/2024. FINDINGS: Stable cardiomegaly with mild vascular congestion. No focal consolidation, pleural effusion or pneumothorax. Large hiatal hernia. No acute osseous pathology. IMPRESSION: 1. Cardiomegaly with mild vascular congestion. No focal consolidation. 2. Large hiatal hernia. Electronically Signed   By: Vanetta Chou M.D.   On: 06/12/2024 15:44    EKG: Independently reviewed. Sinus rhythm.   Assessment/Plan   1. Acute encephalopathy  - No acute findings on head CT  - Likely secondary to UTI  - Use delirium precautions, expand workup if fails to improve as expected with treatment of UTI   2. UTI; gross hematuria; nephrolithiasis  - Bilateral JJ stents placed on 05/23/24, now presenting with recurrent UTI and gross hematuria  - Recent culture data reviewed, will treat with Zosyn  for now, follow cultures, check CT, monitor for development of clot obstruction   3. Hx of CVA  - Hold Plavix  for now, continue Lipitor   4. Acute hypoxic respiratory failure  - Requiring supplemental O2 in ED  - Mild vascular congestion noted on CXR  - SLIV, check BNP, continue supplemental O2 as-needed, consider diuresing    DVT prophylaxis: SCDs Code Status: DNR/DNI Level of Care: Level of care:  Progressive Family Communication: Arzella Moccasin, updated from ED  Disposition Plan:  Patient is from: SNF  Anticipated  d/c is to: SNF  Anticipated d/c date is: 06/16/24  Patient currently: Pending cultures, CT, clinical improvement  Consults called: None  Admission status: Inpatient     Evalene GORMAN Sprinkles, MD [facility]  06/13/2024, 12:39 AM       [1] No Active Allergies  "

## 2024-06-12 NOTE — ED Notes (Signed)
 Collected cultures from left hand. To collect right-sided cultures after fluids finish running on the right hand

## 2024-06-12 NOTE — ED Notes (Signed)
 On first stick collected only before vein blew, used for ammonia. RN aware

## 2024-06-13 ENCOUNTER — Inpatient Hospital Stay (HOSPITAL_COMMUNITY)

## 2024-06-13 LAB — CBC
HCT: 36.4 % — ABNORMAL LOW (ref 39.0–52.0)
Hemoglobin: 11.7 g/dL — ABNORMAL LOW (ref 13.0–17.0)
MCH: 29.3 pg (ref 26.0–34.0)
MCHC: 32.1 g/dL (ref 30.0–36.0)
MCV: 91.2 fL (ref 80.0–100.0)
Platelets: 205 K/uL (ref 150–400)
RBC: 3.99 MIL/uL — ABNORMAL LOW (ref 4.22–5.81)
RDW: 13.9 % (ref 11.5–15.5)
WBC: 8.5 K/uL (ref 4.0–10.5)
nRBC: 0 % (ref 0.0–0.2)

## 2024-06-13 LAB — BASIC METABOLIC PANEL WITH GFR
Anion gap: 9 (ref 5–15)
BUN: 22 mg/dL (ref 8–23)
CO2: 24 mmol/L (ref 22–32)
Calcium: 8.5 mg/dL — ABNORMAL LOW (ref 8.9–10.3)
Chloride: 110 mmol/L (ref 98–111)
Creatinine, Ser: 0.72 mg/dL (ref 0.61–1.24)
GFR, Estimated: >60 mL/min
Glucose, Bld: 120 mg/dL — ABNORMAL HIGH (ref 70–99)
Potassium: 3.9 mmol/L (ref 3.5–5.1)
Sodium: 142 mmol/L (ref 135–145)

## 2024-06-13 LAB — PRO BRAIN NATRIURETIC PEPTIDE: Pro Brain Natriuretic Peptide: 55.9 pg/mL

## 2024-06-13 MED ORDER — IPRATROPIUM-ALBUTEROL 0.5-2.5 (3) MG/3ML IN SOLN
3.0000 mL | Freq: Four times a day (QID) | RESPIRATORY_TRACT | Status: DC | PRN
  Administered 2024-06-13: 3 mL via RESPIRATORY_TRACT
  Filled 2024-06-13: qty 3

## 2024-06-13 MED ORDER — CHLORHEXIDINE GLUCONATE CLOTH 2 % EX PADS
6.0000 | MEDICATED_PAD | Freq: Every day | CUTANEOUS | Status: DC
  Administered 2024-06-13 – 2024-06-18 (×6): 6 via TOPICAL

## 2024-06-13 MED ORDER — SODIUM CHLORIDE 0.9 % IR SOLN
3000.0000 mL | Status: DC
  Administered 2024-06-13: 3000 mL

## 2024-06-13 NOTE — Procedures (Cosign Needed Addendum)
" ° °  Procedure: 3-way foley catheter placement  Urology Procedure Note:  84 year old male with gross hematuria.  Bilateral indwelling stents on Plavix  with known bladder stone.  Patient was prepped and draped in the usual sterile fashion.  10 mL of sterile lubricant was injected directly into the urethra.  Following that a 46f three-way catheter was advanced to the level of the bladder without resistance.  There was an immediate return of clear yellow, lightly blood-tinged urine.  Having confirmed placement, retention balloon was inflated with 30 mL of sterile water .  Catheter was hand irrigated without any return of clot material.  Following that, catheter was placed to gravity drainage without dependent loops, concluding the procedure.  StatLock was affixed to the upper right thigh and third port plugged.  Ole Bourdon, NP [facility] Pager: [phone]   "

## 2024-06-13 NOTE — ED Notes (Signed)
 Foley placed by urologist, states unsure at this time if CBI will be required. CBI supplies placed on back of pt stretcher to go to floor with him

## 2024-06-13 NOTE — ED Notes (Signed)
 Spoke to pt POA [place], updated on POC. All questions answered

## 2024-06-13 NOTE — Discharge Instructions (Signed)
 To address social isolation and forming connections:  Education Officer, [employer]: [phone] / [address] [place], [place] [address]  -[employer]: From community-building activities like luncheons and group outings to fitness activities like chair tap, tai chi and more, [employer] has a variety of offerings specifically created for active older adults. Some of these high-quality programs are free and included with your membership, while others require an additional fee. Some Medicare Plans cover Silver Sneakers programs.   [facility]: [address], [place], [place] [address]. Phone [phone].  [facility] Branch: [address], [place], [place] [address]. Phone [phone]. This group of seniors, called the [employer]iff, meets every Tuesday at 12:30pm. Enjoy bingo, one-day-trips, fun activities, and so much more!  -[employer] Branch: [address], [place], [place] [address]. Phone [phone]. Tai chi is often described as meditation in motion. There is growing evidence that this mind-body practice has value in treating or preventing many health problems. You can get started even if you arent in top shape or the best of health. Offered Monday & Wednesday at 10:45 am in Room 1, $35 for Members and $55 for Community Guests per month.  [facility]: [address] [place], [place] [address]. Phone [phone]. Tai chi is often described as meditation in motion. There is growing evidence that this mind-body practice has value in treating or preventing many health problems. You can get started even if you arent in top shape or the best of health. Offered Monday & Wednesday at 8:50am, cost is $35 per month for [facility] Members and $60 per month for [employer].  [facility] Branch: [address] [place], [place], [place] [address]. Phone [phone]  - FAMILY Branch: [address], [place], [place]  [address]. Phone [phone].  -[facility]: [address] [place], [place] [address]. Phone [phone].  -Dial 988: Talk lifeline 24/7.  -  - [facility] Warmline: [phone]  -Enroll in PACE program (Program of All-Inclusive Care for the Elderly): a Medicare and Medicaid initiative that provides comprehensive medical and social services to frail seniors (55+) who need nursing home-level care but prefer to stay in their communities, allowing them to age in place with support like primary care, therapy, meals, and transportation, coordinated by an interdisciplinary team. If you have both Medicare (for people 65+) and Medicaid (income-tested), then you may pay nothing. People without Medicare or Medicaid pay a monthly premium. The amount of this premium depends on your healthcare and financial needs. Long-term care insurance may pay for your PACE care. This coverage is determined by your insurer. Enrollment Information: [phone] Office: [phone]

## 2024-06-13 NOTE — ED Notes (Signed)
 Secretary to order CBI supplies

## 2024-06-13 NOTE — ED Notes (Signed)
 Pt coughing with small sips of water , Elpidio MD notified. SLP eval ordered.

## 2024-06-13 NOTE — Consult Note (Signed)
 "  Urology Consult Note   Requesting Attending Physician:  Elpidio Reyes DEL, MD Service Providing Consult: Urology  Consulting Attending: Dr. Devere   Reason for Consult:  gross heamturia  HPI: Gregory Kerr is seen in consultation for reasons noted above at the request of Elpidio Reyes DEL, MD. Patient is a 84 y.o. male presenting from his SNF with altered mental status and gross hematuria. Pt was recently discharged from [facility] following an admission from 1/1-1/7 and again 1/9-1/16/26 for treatment of sepsis 2/2 UTI, bilateral JJ stent placement for obstructing stones, and failure to thrive.   Pt minimally responsive but was agreed to place foley.  ------------------  Assessment:   84 y.o. male with gross hematuria and bilateral ureteral stents on Plavix    Recommendations: #gross hematuria #bilateral ureteral stents #bladder stone #UTI  Bilateral ureteral stents in place, 2.5cm bladder stone, with frank bloody urine. Placed 3-way foley and hand irrigate PRN. Very frank blood in Purewick canister on arrival and only lightly blood tinged urine on catheter placement. No report of previous catheter in attempt in ED. Based on pt contracted habitus, UA likely collected withy straight cath attempt. Assuming traumatic in/out at this time. Will likely have some degree of bleeding while stents are in place while on Plavix . Though that doesn't appear to be the case at this time.  UA color contaminated but many bacteria noted on microscopy. Recent UTI, 05/23/24 culture (+) for resistant  proteus and enterococcus. Treated with 7d Cipro  after discharge. Shows to be resistant to Macrobid  and Cipro . Recommend broad empiric ABX. Rocephin  should be adequate per previous sensitivities.  Urology will follow peripherally over the weekend. Please contact Urology with acute changes. Voiding trial before discharge  Case and plan discussed with Dr. 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   Pneumonia    once or twice (09/22/2014)   Retinal detachment    left eye   Stroke (HCC) 2007   left side doesn't work now (09/22/2014)    Past Surgical History:  Past Surgical History:  Procedure Laterality Date   APPENDECTOMY  05/27/2009   Jeoffrey Dawn, MD   CATARACT EXTRACTION W/ INTRAOCULAR LENS  IMPLANT, BILATERAL Bilateral ?2014   CHOLECYSTECTOMY N/A 09/26/2014   Procedure: LAPAROSCOPIC CHOLECYSTECTOMY WITH INTRAOPERATIVE CHOLANGIOGRAM;  Surgeon: Donnice Lima, MD;  Location: [facility] OR;  Service: General;  Laterality: N/A;   COLONOSCOPY WITH PROPOFOL  N/A 12/29/2015   Procedure: COLONOSCOPY WITH PROPOFOL ;  Surgeon: Jerrell Sol, MD;  Location: [facility] ENDOSCOPY;  Service: Endoscopy;  Laterality: N/A;   CYSTOSCOPY W/ URETERAL STENT PLACEMENT Bilateral 05/23/2024   Procedure: CYSTOSCOPY WITH LEFT RETROGRADE PYELOGRAM AND BILATARAL URETERAL STENT INSERTION;  Surgeon: Elisabeth Valli BIRCH, MD;  Location: [facility] OR;  Service: Urology;  Laterality: Bilateral;   ERCP N/A 09/25/2014   Procedure: ENDOSCOPIC RETROGRADE CHOLANGIOPANCREATOGRAPHY (ERCP);  Surgeon: Oliva Boots, MD;  Location: [facility] ENDOSCOPY;  Service: Endoscopy;  Laterality: N/A;   IR CATHETER TUBE CHANGE  05/24/2021   PARS PLANA VITRECTOMY Left 01/30/2019   Procedure: PARS PLANA VITRECTOMY WITH 25 GAUGE;  Surgeon: Jarold Mayo, MD;  Location: [facility] OR;  Service: Ophthalmology;  Laterality: Left;   PARS PLANA VITRECTOMY Right 11/13/2019  Procedure: PARS PLANA VITRECTOMY WITH 25 GAUGE WITH REMOVAL OF POSTERIOR CAPSULAR OPACIFICATION WITH VITRECTOR;  Surgeon: Jarold Mayo, MD;  Location: [facility] OR;  Service: Ophthalmology;  Laterality: Right;    PHOTOCOAGULATION WITH LASER Left 01/30/2019   Procedure: PHOTOCOAGULATION WITH LASER;  Surgeon: Jarold Mayo, MD;  Location: [facility] OR;  Service: Ophthalmology;  Laterality: Left;    Medication: Current Facility-Administered Medications  Medication Dose Route Frequency Provider Last Rate Last Admin   acetaminophen  (TYLENOL ) tablet 650 mg  650 mg Oral Q6H PRN Opyd, Timothy S, MD   650 mg at 06/13/24 9051   Or   acetaminophen  (TYLENOL ) suppository 650 mg  650 mg Rectal Q6H PRN Opyd, Timothy S, MD       atorvastatin  (LIPITOR) tablet 40 mg  40 mg Oral QPM Opyd, Timothy S, MD       Chlorhexidine  Gluconate Cloth 2 % PADS 6 each  6 each Topical Daily Elpidio Reyes DEL, MD       ipratropium-albuterol  (DUONEB) 0.5-2.5 (3) MG/3ML nebulizer solution 3 mL  3 mL Nebulization Q6H PRN Opyd, Timothy S, MD   3 mL at 06/13/24 9486   piperacillin -tazobactam (ZOSYN ) IVPB 3.375 g  3.375 g Intravenous Q8H Opyd, Timothy S, MD   Stopped at 06/13/24 0911   senna-docusate (Senokot-S) tablet 1 tablet  1 tablet Oral QHS PRN Opyd, Timothy S, MD       sodium chloride  flush (NS) 0.9 % injection 3 mL  3 mL Intravenous Q12H Opyd, Timothy S, MD   3 mL at 06/13/24 0911   sodium chloride  irrigation 0.9 % 3,000 mL  3,000 mL Irrigation Continuous Walden, Jeffrey H, MD       tamsulosin  (FLOMAX ) capsule 0.4 mg  0.4 mg Oral QHS Opyd, Timothy S, MD       Current Outpatient Medications  Medication Sig Dispense Refill   acetaminophen  (TYLENOL ) 650 MG CR tablet Take 650 mg by mouth in the morning and at bedtime.     Amino Acids-Protein Hydrolys (PRO-STAT AWC) LIQD Take 30 mLs by mouth 2 (two) times daily with a meal.     atorvastatin  (LIPITOR) 40 MG tablet Take 1 tablet (40 mg total) by mouth every evening. (Patient taking differently: Take 40 mg by mouth at bedtime.)     baclofen  (LIORESAL ) 20 MG tablet Take 1 tablet (20 mg total) by mouth 3 (three) times daily. 15 each 0   bisacodyl  (DULCOLAX) 10 MG suppository Place 10 mg rectally  daily as needed for moderate constipation.     calcium -vitamin D  (OSCAL WITH D) 500-5 MG-MCG tablet Take 1 tablet by mouth 2 (two) times daily.     clopidogrel  (PLAVIX ) 75 MG tablet Take 1 tablet (75 mg total) by mouth daily. 30 tablet 0   clotrimazole-betamethasone (LOTRISONE) cream Apply 1 Application topically daily.     Cranberry 450 MG TABS Take 450 mg by mouth daily.     Dextromethorphan -quiNIDine (NUEDEXTA ) 20-10 MG capsule Take 1 capsule by mouth at bedtime. For Pseudobulbar Affect (PBA)     gabapentin  (NEURONTIN ) 100 MG capsule Take 200 mg by mouth 3 (three) times daily.     gentamicin ointment (GARAMYCIN) 0.1 % Apply 1 Application topically daily.     HYDROcodone -acetaminophen  (NORCO/VICODIN) 5-325 MG tablet Take 1 tablet by mouth in the morning, at noon, and at bedtime. 15 tablet 0   ipratropium-albuterol  (DUONEB) 0.5-2.5 (3) MG/3ML SOLN Inhale 3 mLs into the lungs in the morning, at noon, and at bedtime.     loperamide  (IMODIUM   A-D) 2 MG tablet Take 4 mg by mouth 4 (four) times daily as needed for diarrhea or loose stools.     loratadine  (CLARITIN ) 10 MG tablet Take 10 mg by mouth at bedtime.     LORazepam  (ATIVAN ) 0.5 MG tablet Take 0.5 mg by mouth daily as needed for anxiety.     nitroGLYCERIN  (NITROSTAT ) 0.4 MG SL tablet Place 0.4 mg under the tongue every 5 (five) minutes as needed for chest pain.     pantoprazole  (PROTONIX ) 20 MG tablet Take 20 mg by mouth daily.     phenazopyridine  (PYRIDIUM ) 100 MG tablet Take 100 mg by mouth every 8 (eight) hours as needed (bladder spasms).     polyethylene glycol (MIRALAX  / GLYCOLAX ) packet Take 17 g by mouth daily as needed (constipation).      Prenatal Vit-Fe Fumarate-FA (WESTAB PLUS) 27-1 MG TABS Take 1 tablet by mouth at bedtime.     PREVIDENT 5000 BOOSTER PLUS 1.1 % PSTE Take 1 Application by mouth at bedtime.     saccharomyces boulardii (FLORASTOR) 250 MG capsule Take 250 mg by mouth every 12 (twelve) hours.     sertraline  (ZOLOFT ) 100  MG tablet Take 100 mg by mouth daily. Take with 25mg  Sertraline  tablet for a combined dose 125mg .     sertraline  (ZOLOFT ) 25 MG tablet Take 25 mg by mouth daily. Take with 100mg  Sertraline  tablet for a combined dose 125mg .     tamsulosin  (FLOMAX ) 0.4 MG CAPS capsule Take 1 capsule (0.4 mg total) by mouth at bedtime. 30 capsule 0   trimethoprim  (TRIMPEX ) 100 MG tablet Take 100 mg by mouth at bedtime. **No stop date** per MAR      Allergies: Allergies[1]  Social History: Social History[2]  Family History Family History  Problem Relation Age of Onset   Heart attack Mother    Heart attack Father    Cancer Maternal Aunt        Stomach   Cancer Paternal Wylie Kennyth Proud    Review of Systems  Genitourinary:  Positive for hematuria. Negative for dysuria, flank pain, frequency and urgency.     Objective   Vital signs in last 24 hours: BP (!) 136/56   Pulse 78   Temp 99.1 F (37.3 C) (Oral)   Resp 12   Ht 5' 6 (1.676 m)   Wt 78 kg   SpO2 95%   BMI 27.75 kg/m   Physical Exam General: A&O, resting, appropriate HEENT: [place]/AT Pulmonary: Normal work of breathing Cardiovascular: no cyanosis GU: bloody red urine   Most Recent Labs: Lab Results  Component Value Date   WBC 8.5 06/13/2024   HGB 11.7 (L) 06/13/2024   HCT 36.4 (L) 06/13/2024   PLT 205 06/13/2024    Lab Results  Component Value Date   NA 142 06/13/2024   K 3.9 06/13/2024   CL 110 06/13/2024   CO2 24 06/13/2024   BUN 22 06/13/2024   CREATININE 0.72 06/13/2024   CALCIUM  8.5 (L) 06/13/2024   MG 1.9 06/05/2024   PHOS 2.5 06/05/2024    Lab Results  Component Value Date   INR 1.0 06/12/2024   APTT 26 02/11/2022     Urine Culture: @[id number] (laburin,org,[id number],[id number])@   IMAGING: CT RENAL STONE STUDY Result Date: 06/13/2024 EXAM: CT ABDOMEN AND PELVIS WITHOUT CONTRAST 06/13/2024 02:12:36 AM TECHNIQUE: CT of the abdomen and pelvis was performed without the administration of intravenous  contrast. Multiplanar reformatted images are provided for review. Automated  exposure control, iterative reconstruction, and/or weight-based adjustment of the mA/kV was utilized to reduce the radiation dose to as low as reasonably achievable. COMPARISON: 05/30/2024 CLINICAL HISTORY: Urolithiasis, symptomatic; Acute urinary tract infection, gross hematuria, bilateral ureteral stents. FINDINGS: LOWER CHEST: Patchy right lower lobe opacity, atelectasis versus pneumonia. LIVER: The liver is unremarkable. GALLBLADDER AND BILE DUCTS: Status post cholecystectomy. No biliary ductal dilatation. SPLEEN: No acute abnormality. PANCREAS: No acute abnormality. ADRENAL GLANDS: No acute abnormality. KIDNEYS, URETERS AND BLADDER: Bilateral double pigtail ureteral stents in satisfactory position. Bilateral nonobstructing renal calculi, including a 13 mm calculus in the right upper pole and a 15 mm calculus in the left renal collecting system. Layering 2.5 cm bladder calculus. No hydronephrosis. No perinephric or periureteral stranding. GI AND BOWEL: Large hiatal hernia. Stomach demonstrates no acute abnormality. There is no bowel obstruction. Moderate fat containing left inguinal hernia. Large right inguinal/scrotal hernia containing a loop of nondilated small bowel. PERITONEUM AND RETROPERITONEUM: No ascites. No free air. VASCULATURE: Aorta is normal in caliber. LYMPH NODES: No lymphadenopathy. REPRODUCTIVE ORGANS: Prostate is notable for dystrophic calcifications. BONES AND SOFT TISSUES: Moderate superior endplate crushing fracture deformity at L3 chronic, with an underlying lytic lesion. Additional lytic lesion at L2 (sagittal image 60). No focal soft tissue abnormality. IMPRESSION: 1. Bilateral double pigtail ureteral stents in appropriate position. 2. Bilateral nonobstructing renal calculi, including a 13 mm calculus in the right upper pole and a 15 mm calculus in the left renal collecting system. Layering 2.5 cm bladder  calculus. 3. Patchy right lower lobe opacity, atelectasis versus pneumonia. 4. Lytic lesions at L2 and L3 with chronic L3 compression fracture. This appearance raises concern for myeloma vs lytic metastases. 5. Additional ancillary findings, as above. Electronically signed by: Pinkie Pebbles MD 06/13/2024 02:23 AM EST RP Workstation: [id number]   CT Head Wo Contrast Result Date: 06/12/2024 EXAM: CT HEAD WITHOUT CONTRAST 06/12/2024 04:20:33 PM TECHNIQUE: CT of the head was performed without the administration of intravenous contrast. Automated exposure control, iterative reconstruction, and/or weight based adjustment of the mA/kV was utilized to reduce the radiation dose to as low as reasonably achievable. COMPARISON: None available. CLINICAL HISTORY: Mental status change, unknown cause Mental status change, unknown cause. FINDINGS: BRAIN AND VENTRICLES: No acute hemorrhage. No evidence of acute infarct. No hydrocephalus. No extra-axial collection. No mass effect or midline shift. Similar remote right thalamic lacunar infarct and cerebral atrophy. ORBITS: No acute abnormality. SINUSES: No acute abnormality. SOFT TISSUES AND SKULL: No acute soft tissue abnormality. No skull fracture. IMPRESSION: 1. No acute intracranial abnormality. Electronically signed by: Glendia Molt MD 06/12/2024 04:39 PM EST RP Workstation: [id number]   DG Chest Port 1 View Result Date: 06/12/2024 CLINICAL DATA:  Questionable sepsis. EXAM: PORTABLE CHEST 1 VIEW COMPARISON:  Chest radiograph dated 06/02/2024. FINDINGS: Stable cardiomegaly with mild vascular congestion. No focal consolidation, pleural effusion or pneumothorax. Large hiatal hernia. No acute osseous pathology. IMPRESSION: 1. Cardiomegaly with mild vascular congestion. No focal consolidation. 2. Large hiatal hernia. Electronically Signed   By: Vanetta Chou M.D.   On: 06/12/2024 15:44    ------  Ole Bourdon, NP Pager: [phone]   Please contact the  urology consult pager with any further questions/concerns.     [1] No Active Allergies [2]  Social History Tobacco Use   Smoking status: Former    Current packs/day: 0.00    Average packs/day: 1.0 packs/day    Types: Cigarettes    Quit date: 12/20/2005    Years since quitting: 18.4   Smokeless  tobacco: Never  Vaping Use   Vaping status: Never Used  Substance Use Topics   Alcohol  use: Not Currently    Comment: 09/22/2014 stopped in 2007; drank occasionally when I did drink   Drug use: No   "

## 2024-06-13 NOTE — Progress Notes (Addendum)
 " Progress Note   Patient: Gregory Kerr FMW:[id number] DOB: Oct 15, 1940 DOA: 06/12/2024     1 DOS: the patient was seen and examined on 06/13/2024   Brief hospital course: 84 y.o. male with medical history significant for CVA with left-sided hemiparesis, BPH, and recent admissions for influenza, aspiration, Proteus and enterococcal UTI, and pseudomonal bacteremia who returns with altered mental status and malodorous bloody urine.   Patient is unable to provide much history.  He has reportedly been disoriented today and was noted to have bloody and foul-smelling urine suspicious for recurrent UTI.  Bilateral ureteral stents were placed on 05/23/2025.  He had been seen by ID during the recent hospitalization and completed a course of ciprofloxacin  after discharge.   ED Course: Upon arrival to the ED, patient is found to be afebrile and saturating 100% on 5 L/min of supplemental oxygen with normal HR and stable BP.  Labs are most notable for elevated BUN to creatinine ratio, normal WBC, and normal lactate.   Blood and urine cultures were collected in the ED and the patient was given a liter of NS and Rocephin .  Assessment and Plan: Acute encephalopathy  - No acute findings on head CT  - Likely secondary to UTI  - Use delirium precautions, expand workup if fails to improve as expected with treatment of UTI  - Clearing this morning.  He is more awake and alert.  Soft-spoken but can make himself understood.   UTI; gross hematuria; nephrolithiasis  - Bilateral JJ stents placed on 05/23/24, now presenting with recurrent UTI and gross hematuria  - Recent culture data reviewed, will treat with Zosyn  for now, follow cultures, check CT, monitor for development of clot obstruction  - Urology consulted today.  Plan will be to insert three-way catheter and start continuous bladder irrigation. -Continue Zosyn .  Urine culture has been sent but urinalysis unable to be processed due to gross blood.   Hx of CVA  -  Hold Plavix  for now, continue Lipitor  -Chronic left-sided hemiparesis.   Acute hypoxic respiratory failure  - Requiring supplemental O2 in ED  - Mild vascular congestion noted on CXR  - SLIV, check BNP, continue supplemental O2 as-needed, consider diuresing   Mild anemia:  - Chronic and likely secondary to chronic disease -Will trend  Swallow dysfunction: - Speech therapy seen patient.  Concern for his ability to swallow. - MBS is pending. - SLP recommended to continue current diet with swallowing precautions in place.    DVT prophylaxis: SCDs Code Status: DNR/DNI Level of Care: Level of care: Progressive Family Communication: Arzella Moccasin, updated by phone Disposition Plan:  Patient is from: SNF  Anticipated d/c is to: SNF       Subjective: Patient more awake and alert this morning.  Reports he is thirsty.  Physical Exam: Vitals:   06/13/24 1015 06/13/24 1030 06/13/24 1034 06/13/24 1230  BP: 115/63 (!) 117/51  (!) 136/56  Pulse: 87 84  78  Resp: 18 20  12   Temp:   99.1 F (37.3 C)   TempSrc:   Oral   SpO2: 96% 95%  95%  Weight:      Height:       Gen:  Alert, cooperative patient who appears stated age in no acute distress.  Vital signs reviewed. HEENT:  dry mucus membranes Cardiac:  Regular rate and rhythm without murmur auscultated.  Good S1/S2. Pulm:  Clear to auscultation bilaterally with good air movement.  No wheezes or rales noted.  Abd:  S/ND/NT Neuro:  Alert and oriented, soft-spoken.  Left sided hemiparesis noted.     Disposition: Status is: Inpatient    Time spent: 35 minutes  Author: Reyes VEAR Gaw, MD 06/13/2024 1:46 PM  For on call review [email or web address].  "

## 2024-06-13 NOTE — Progress Notes (Addendum)
 SLP Cancellation Note  Patient Details Name: Gregory Kerr MRN: [id number] DOB: Oct 04, 1940   Cancelled treatment:       Reason Eval/Treat Not Completed: Other (comment) Attempted to complete MBS today but there were scheduling conflicts. Pt initially arrived in radiology, but per radiology tech, needed to go back to his room in the ED for other services to be performed. Will continue efforts to complete test as soon as it can be coordinated with SLP team and radiology.     Leita SAILOR., M.A. CCC-SLP Acute Rehabilitation Services Office: [phone]  Secure chat preferred  06/13/2024, 3:26 PM

## 2024-06-13 NOTE — ED Notes (Signed)
 Gregory Kerr, healthcare [place] [phone] is requesting an asap return call with a status update.

## 2024-06-13 NOTE — ED Notes (Signed)
 Gregory Kerr, the patient's [place] [phone] would like to speak with the RN concerning the patient's barium swallow test and to receive an update.

## 2024-06-13 NOTE — Evaluation (Signed)
 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   Pneumonia    once or twice (09/22/2014)   Retinal detachment    left eye   Stroke (HCC) 2007   left side doesn't work now (09/22/2014)   Past Surgical History:  Past Surgical History:  Procedure Laterality Date   APPENDECTOMY  05/27/2009   Jeoffrey Dawn, MD   CATARACT EXTRACTION W/ INTRAOCULAR LENS  IMPLANT, BILATERAL Bilateral ?2014   CHOLECYSTECTOMY N/A 09/26/2014   Procedure: LAPAROSCOPIC CHOLECYSTECTOMY WITH INTRAOPERATIVE CHOLANGIOGRAM;  Surgeon: Donnice Lima, MD;  Location: [facility] OR;  Service: General;  Laterality: N/A;   COLONOSCOPY WITH PROPOFOL  N/A 12/29/2015   Procedure: COLONOSCOPY WITH PROPOFOL ;  Surgeon: Jerrell Sol, MD;  Location: [facility] ENDOSCOPY;  Service: Endoscopy;  Laterality: N/A;   CYSTOSCOPY W/ URETERAL STENT PLACEMENT Bilateral 05/23/2024   Procedure: CYSTOSCOPY WITH LEFT RETROGRADE PYELOGRAM AND BILATARAL URETERAL STENT INSERTION;  Surgeon: Elisabeth Valli BIRCH, MD;  Location: [facility] OR;  Service: Urology;  Laterality: Bilateral;   ERCP N/A 09/25/2014   Procedure: ENDOSCOPIC RETROGRADE CHOLANGIOPANCREATOGRAPHY (ERCP);  Surgeon: Oliva Boots, MD;  Location: [facility] ENDOSCOPY;  Service: Endoscopy;  Laterality: N/A;   IR CATHETER TUBE CHANGE  05/24/2021   PARS PLANA VITRECTOMY Left 01/30/2019   Procedure: PARS PLANA VITRECTOMY WITH 25 GAUGE;  Surgeon: Jarold Mayo, MD;  Location:  [facility] OR;  Service: Ophthalmology;  Laterality: Left;   PARS PLANA VITRECTOMY Right 11/13/2019   Procedure: PARS PLANA VITRECTOMY WITH 25 GAUGE WITH REMOVAL OF POSTERIOR CAPSULAR OPACIFICATION WITH VITRECTOR;  Surgeon: Jarold Mayo, MD;  Location: [facility] OR;  Service: Ophthalmology;  Laterality: Right;   PHOTOCOAGULATION WITH LASER Left 01/30/2019   Procedure: PHOTOCOAGULATION WITH LASER;  Surgeon: Jarold Mayo, MD;  Location: [facility] OR;  Service: Ophthalmology;  Laterality: Left;   HPI:  Gregory Kerr is an 84 y.o. male with medical history significant for CVA with left-sided hemiparesis, BPH, and recent admissions for influenza, aspiration, Proteus and enterococcal UTI, and pseudomonal bacteremia who returns with altered mental status and malodorous bloody urine.     Patient is unable to provide much history.  He has reportedly been disoriented today and was noted to have bloody and foul-smelling urine suspicious for recurrent UTI.  Bilateral ureteral stents were placed on 05/23/2025.  He had been seen by ID during the recent hospitalization and completed a course of ciprofloxacin  after discharge. Multiple BSE completed during past hospitalizations recommending Regular/thin liquids.  ST consulted for BSE d/t pt coughing with sips of liquids per nursing report.    Assessment / Plan / Recommendation  Clinical Impression  Recommend MBS to assess swallow function objectively d/t overt s/s of aspiration during BSE including delayed, wet, congested cough and deconditioning. Concerned about potential silent aspiration given pmhx. Continue current diet with swallowing precautions/aspiration precautions in place and small sips of thin liquid.  Medications crushed in puree.  ST will f/u for dysphagia tx/education during acute stay.  Pt assessed via clinical swallow  evaluation with slow, prolonged mastication, delay in the initiation of the swallow and wet, congested cough noted post cumulative intake.  Pt exhibited a weak  volitional cough and RR fluctuated between 25-40 during assessment.  Vocal quality hypophonic/breathy during short phrases and pt stated this is not typical.  Pt in considerable amount of pain (10/10, nursing informed) per report which impacted sustained attention during trial.  Nursing reported coughing after small sips of thin which generated this assessment, but this was not observed during trials during CSE.  Pt given respiratory/breathing strategies during intake and plan for MBS to assess swallow function with agreement noted.  ST will continue to f/u for dysphagia tx/education.  Thank you for this consult. SLP Visit Diagnosis: Dysphagia, unspecified (R13.10)    Aspiration Risk  Mild aspiration risk    Diet Recommendation   Thin;Age appropriate regular with small sips  Medication Administration: Crushed with puree    Other Recommendations Oral Care Recommendations: Oral care BID;Staff/trained caregiver to provide oral care     Swallow Evaluation Recommendations  Regular/thin with small sips only; crushed meds in puree   Assistance Recommended at Discharge  FULL  Functional Status Assessment Patient has had a recent decline in their functional status and demonstrates the ability to make significant improvements in function in a reasonable and predictable amount of time.  Frequency and Duration min 2x/week  1 week       Prognosis Prognosis for improved oropharyngeal function: Good      Swallow Study   General Date of Onset: 06/12/24 HPI: Gregory Kerr is an 84 y.o. male with medical history significant for CVA with left-sided hemiparesis, BPH, and recent admissions for influenza, aspiration, Proteus and enterococcal UTI, and pseudomonal bacteremia who returns with altered mental status and malodorous bloody urine.     Patient is unable to provide much history.  He has reportedly been disoriented today and was noted to have bloody and foul-smelling urine suspicious for recurrent UTI.   Bilateral ureteral stents were placed on 05/23/2025.  He had been seen by ID during the recent hospitalization and completed a course of ciprofloxacin  after discharge. Multiple BSE completed during past hospitalizations recommending Regular/thin liquids.  ST consulted for BSE d/t pt coughing with sips of liquids per nursing report. Type of Study: Bedside Swallow Evaluation Previous Swallow Assessment: BSE completed 05/31/24 with recommendations for Regular/thin liquids. Diet Prior to this Study: Regular;Thin liquids (Level 0) Temperature Spikes Noted: Yes Respiratory Status: Room air History of Recent Intubation: No Behavior/Cognition: Alert;Cooperative;Requires cueing;Other (Comment) (d/t pain level) Oral Cavity Assessment: Dry Oral Care Completed by SLP: Recent completion by staff Oral Cavity - Dentition: Missing dentition Self-Feeding Abilities: Needs assist Patient Positioning: Upright in bed Baseline Vocal Quality: Low vocal intensity;Breathy Volitional Cough: Weak;Congested Volitional Swallow: Able to elicit    Oral/Motor/Sensory Function Overall Oral Motor/Sensory Function: Mild impairment Facial ROM: Within Functional Limits Facial Symmetry: Abnormal symmetry left;Other (Comment) (slight at rest) Lingual Symmetry: Within Functional Limits   Ice Chips Ice chips: Within functional limits Presentation: Spoon Other Comments: single   Thin Liquid Thin Liquid: Impaired Presentation: Straw (small/successive sips) Pharyngeal  Phase Impairments: Suspected delayed Swallow;Cough - Delayed    Nectar Thick Nectar Thick Liquid: Not tested   Honey Thick Honey Thick Liquid: Not tested   Puree Puree: Within functional limits Presentation: Spoon   Solid     Solid: Impaired Presentation: Spoon Oral Phase Functional Implications: Prolonged oral transit Pharyngeal Phase Impairments: Multiple swallows Other Comments: required liquid wash; delayed cough  Pat  Mahalia Dykes,M.S.,CCC-SLP 06/13/2024,9:57 AM

## 2024-06-14 ENCOUNTER — Inpatient Hospital Stay (HOSPITAL_COMMUNITY)

## 2024-06-14 DIAGNOSIS — G9340 Encephalopathy, unspecified: Secondary | ICD-10-CM | POA: Diagnosis not present

## 2024-06-14 LAB — BASIC METABOLIC PANEL WITH GFR
Anion gap: 11 (ref 5–15)
BUN: 18 mg/dL (ref 8–23)
CO2: 22 mmol/L (ref 22–32)
Calcium: 8.7 mg/dL — ABNORMAL LOW (ref 8.9–10.3)
Chloride: 107 mmol/L (ref 98–111)
Creatinine, Ser: 0.75 mg/dL (ref 0.61–1.24)
GFR, Estimated: >60 mL/min
Glucose, Bld: 106 mg/dL — ABNORMAL HIGH (ref 70–99)
Potassium: 3.8 mmol/L (ref 3.5–5.1)
Sodium: 140 mmol/L (ref 135–145)

## 2024-06-14 LAB — CBC
HCT: 36.7 % — ABNORMAL LOW (ref 39.0–52.0)
Hemoglobin: 11.9 g/dL — ABNORMAL LOW (ref 13.0–17.0)
MCH: 29.3 pg (ref 26.0–34.0)
MCHC: 32.4 g/dL (ref 30.0–36.0)
MCV: 90.4 fL (ref 80.0–100.0)
Platelets: 199 10*3/uL (ref 150–400)
RBC: 4.06 MIL/uL — ABNORMAL LOW (ref 4.22–5.81)
RDW: 13.7 % (ref 11.5–15.5)
WBC: 9 10*3/uL (ref 4.0–10.5)
nRBC: 0 % (ref 0.0–0.2)

## 2024-06-14 MED ORDER — LACTATED RINGERS IV SOLN: INTRAVENOUS | Status: AC

## 2024-06-14 NOTE — Progress Notes (Signed)
 " Progress Note   Patient: Gregory Kerr FMW:[id number] DOB: 1940/12/08 DOA: 06/12/2024     2 DOS: the patient was seen and examined on 06/14/2024   Brief hospital course: 84 y.o. male with medical history significant for CVA with left-sided hemiparesis, BPH, and recent admissions for influenza, aspiration, Proteus and enterococcal UTI, and pseudomonal bacteremia who returns with altered mental status and malodorous bloody urine.   Patient is unable to provide much history.  He has reportedly been disoriented today and was noted to have bloody and foul-smelling urine suspicious for recurrent UTI.  Bilateral ureteral stents were placed on 05/23/2025.  He had been seen by ID during the recent hospitalization and completed a course of ciprofloxacin  after discharge.   ED Course: Upon arrival to the ED, patient is found to be afebrile and saturating 100% on 5 L/min of supplemental oxygen with normal HR and stable BP.  Labs are most notable for elevated BUN to creatinine ratio, normal WBC, and normal lactate.   Blood and urine cultures were collected in the ED and the patient was given a liter of NS and Rocephin .  Assessment and Plan:  Acute metabolic encephalopathy  - No acute findings on head CT  - Likely secondary to UTI  - Use delirium precautions, expand workup if fails to improve as expected with treatment of UTI  - Mentation much improved, some underlying mild dysarthria, difficult to stand,   Acute hemorrhagic cystitis  Hematuria with history of nephrolithiasis ureteral stent placement history of nephrolithiasis and bladder stone - Bilateral JJ stents placed on 05/23/24, now presenting with recurrent UTI and gross hematuria  - Recent culture data reviewed, Pseudomonas bacteremia, and UTI growing Enterococcus and Proteus, for now continue with IV Zosyn  . - Urology consult appreciated, Foley catheter inserted, with bladder irrigation.   - Urine culture has been sent but urinalysis unable to be  processed due to gross blood.   Hx of CVA  - Hold Plavix  for now, continue Lipitor  -Chronic left-sided hemiparesis.   Acute hypoxic respiratory failure  Pneumonia -Patient with recent hospitalization due to influenza infection, finished treatment - Requiring supplemental O2 in ED  - CT renal protocol significant fluid right lung base pneumonia - Continue Zosyn  - Concern for aspiration, as he is noted to have some significant upper airway gurgling, discussed with staff, SLP consulted  Mild anemia:  - Chronic and likely secondary to chronic disease -Will trend  Swallow dysfunction: - Speech therapy seen patient.  Concern for his ability to swallow. - MBS is pending. - SLP recommended to continue current diet with swallowing precautions in place.    DVT prophylaxis: SCDs Code Status: DNR/DNI Level of Care: Level of care: Progressive Family Communication: None at bedside today Disposition Plan:  Patient is from: SNF  Anticipated d/c is to: SNF       Subjective: Patient denies any complaints today    Physical Exam: Vitals:   06/14/24 0500 06/14/24 0736 06/14/24 0800 06/14/24 1142  BP:  (!) 152/66 (!) 147/67 (!) 145/76  Pulse:   85   Resp:   20 20  Temp:  98.5 F (36.9 C)  98.7 F (37.1 C)  TempSrc:  Axillary  Oral  SpO2:   99%   Weight: 73.1 kg     Height:         Awake Alert, upper extremity appears contracted, left hemiparesis, communicative Dry mucous brain, delayed skin turgor Good air entry, but upper airway rhonchi RRR,No Gallops,Rubs or new  Murmurs, No Parasternal Heave +ve B.Sounds, Abd Soft, No tenderness, No rebound - guarding or rigidity. No Cyanosis, Clubbing or edema, No new Rash or bruise     Disposition: Status is: Inpatient     Author: Brayton Lye, MD 06/14/2024 12:02 PM  For on call review [email or web address]  "

## 2024-06-14 NOTE — Plan of Care (Signed)

## 2024-06-14 NOTE — Plan of Care (Signed)

## 2024-06-14 NOTE — Plan of Care (Signed)

## 2024-06-15 ENCOUNTER — Inpatient Hospital Stay (HOSPITAL_COMMUNITY)

## 2024-06-15 DIAGNOSIS — G9340 Encephalopathy, unspecified: Secondary | ICD-10-CM | POA: Diagnosis not present

## 2024-06-15 LAB — BASIC METABOLIC PANEL WITH GFR
Anion gap: 16 — ABNORMAL HIGH (ref 5–15)
BUN: 13 mg/dL (ref 8–23)
CO2: 20 mmol/L — ABNORMAL LOW (ref 22–32)
Calcium: 8.2 mg/dL — ABNORMAL LOW (ref 8.9–10.3)
Chloride: 106 mmol/L (ref 98–111)
Creatinine, Ser: 0.63 mg/dL (ref 0.61–1.24)
GFR, Estimated: >60 mL/min
Glucose, Bld: 66 mg/dL — ABNORMAL LOW (ref 70–99)
Potassium: 3.9 mmol/L (ref 3.5–5.1)
Sodium: 142 mmol/L (ref 135–145)

## 2024-06-15 LAB — CBC
HCT: 37.1 % — ABNORMAL LOW (ref 39.0–52.0)
Hemoglobin: 12 g/dL — ABNORMAL LOW (ref 13.0–17.0)
MCH: 28.8 pg (ref 26.0–34.0)
MCHC: 32.3 g/dL (ref 30.0–36.0)
MCV: 89.2 fL (ref 80.0–100.0)
Platelets: 209 10*3/uL (ref 150–400)
RBC: 4.16 MIL/uL — ABNORMAL LOW (ref 4.22–5.81)
RDW: 13.5 % (ref 11.5–15.5)
WBC: 7.7 10*3/uL (ref 4.0–10.5)
nRBC: 0 % (ref 0.0–0.2)

## 2024-06-15 LAB — GLUCOSE, CAPILLARY
Glucose-Capillary: 156 mg/dL — ABNORMAL HIGH (ref 70–99)
Glucose-Capillary: 176 mg/dL — ABNORMAL HIGH (ref 70–99)
Glucose-Capillary: 241 mg/dL — ABNORMAL HIGH (ref 70–99)
Glucose-Capillary: 68 mg/dL — ABNORMAL LOW (ref 70–99)
Glucose-Capillary: 85 mg/dL (ref 70–99)

## 2024-06-15 MED ORDER — HYDRALAZINE HCL 20 MG/ML IJ SOLN
10.0000 mg | Freq: Four times a day (QID) | INTRAMUSCULAR | Status: DC | PRN
  Administered 2024-06-15: 10 mg via INTRAVENOUS
  Filled 2024-06-15: qty 1

## 2024-06-15 MED ORDER — DEXTROSE-SODIUM CHLORIDE 5-0.45 % IV SOLN: INTRAVENOUS | Status: AC

## 2024-06-15 MED ORDER — DEXTROSE 5 % IV SOLN: INTRAVENOUS | Status: DC

## 2024-06-15 NOTE — Progress Notes (Signed)
 " Progress Note   Patient: Gregory Kerr FMW:[id number] DOB: [birth year] DOA: 06/12/2024     3 DOS: the patient was seen and examined on 06/15/2024   Brief hospital course:  84 y.o. male with medical history significant for CVA with left-sided hemiparesis, BPH, and recent admissions for influenza, aspiration, Proteus and enterococcal UTI, and pseudomonal bacteremia who returns with altered mental status and malodorous bloody urine.   Patient is unable to provide much history.  He has reportedly been disoriented today and was noted to have bloody and foul-smelling urine suspicious for recurrent UTI.  Bilateral ureteral stents were placed on 05/23/2025.  He had been seen by ID during the recent hospitalization and completed a course of ciprofloxacin  after discharge.   ED Course: Upon arrival to the ED, patient is found to be afebrile and saturating 100% on 5 L/min of supplemental oxygen with normal HR and stable BP.  Labs are most notable for elevated BUN to creatinine ratio, normal WBC, and normal lactate.   Blood and urine cultures were collected in the ED and the patient was given a liter of NS and Rocephin .  Assessment and Plan:  Acute metabolic encephalopathy  - No acute findings on head CT  - Likely secondary to UTI  - Use delirium precautions, expand workup if fails to improve as expected with treatment of UTI  - Mentation much improved, some underlying mild dysarthria, he is soft-spoken at baseline, but appears back at baseline.   Acute hemorrhagic cystitis  Hematuria with history of nephrolithiasis ureteral stent placement history of nephrolithiasis and bladder stone - Bilateral JJ stents placed on 05/23/24, now presenting with recurrent UTI and gross hematuria  - Recent culture data reviewed, Pseudomonas bacteremia, and UTI growing Enterococcus and Proteus, for now continue with IV Zosyn  . - Urology consult appreciated, Foley catheter inserted, with bladder irrigation.  No further  hematuria, voiding trial, likely tomorrow after discussing with urology - Urine culture has been sent but urinalysis unable to be processed due to gross blood.   Hx of CVA  - Hold Plavix  for now, continue Lipitor  -Chronic left-sided hemiparesis.   Acute hypoxic respiratory failure  Pneumonia Dysphagia -Patient with recent hospitalization due to influenza infection, finished treatment - Requiring supplemental O2 in ED  - CT renal protocol significant fluid right lung base pneumonia - Continue Zosyn  - Concern for aspiration, SLP input greatly appreciated, status post MBS today, continue dysphagia 2, nectar thick liquid, discussed with staff, only to feed while sitting up.  Mild anemia:  - Chronic, mild, no indication for transfusion.     DVT prophylaxis: SCDs holding chemical DVT prophylaxis due to hematuria Code Status: DNR/DNI Level of Care: Level of care: Progressive Family Communication: D/W cousin Vina Disposition Plan:  Patient is from: SNF  Anticipated d/c is to: SNF       Subjective: The patient did his MBS this afternoon, I started on dysphagia 2 with nectar thick.   Physical Exam: Vitals:   06/15/24 0400 06/15/24 0500 06/15/24 0755 06/15/24 1202  BP: (!) 159/76  (!) 169/78 (!) 167/76  Pulse: 79   91  Resp: 18  20 18   Temp: 98.3 F (36.8 C)  98.2 F (36.8 C) 98.5 F (36.9 C)  TempSrc: Axillary  Oral Oral  SpO2: 95%   93%  Weight:  73.4 kg    Height:         Awake Alert, upper extremity appears contracted, left hemiparesis, communicative Dry mucous brain, delayed skin turgor  Good air entry, but upper airway rhonchi Regular rate and rhythm, e +ve B.Sounds, Abd Soft, he has suprapubic tenderness No Cyanosis, Clubbing or edema, left side contracted   Disposition: Status is: Inpatient     Author: Brayton Lye, MD 06/15/2024 12:44 PM  For on call review [email or web address]  "

## 2024-06-15 NOTE — Care Management Important Message (Signed)
 Important Message  Patient Details  Name: Gregory Kerr MRN: [id number] Date of Birth: 02-10-1941   Important Message Given:  Yes - Medicare IM     Marval Gell, RN 06/15/2024, 2:05 PM

## 2024-06-15 NOTE — Procedures (Signed)
 Modified Barium Swallow Study  Patient Details  Name: Gregory Kerr MRN: [id number] Date of Birth: November 07, 1940  Today's Date: 06/15/2024  Modified Barium Swallow completed.  Full report located under Chart Review in the Imaging Section.  History of Present Illness Pt is an 84 yo male presenting 1/22 with AMS. Admitted with acute encephalopathy (CT Head negative), UTI, and acute hypoxic respiratory failure. CXR without focal consolidation but did reveal large hiatal hernia. SLP consulted for swallow eval due to pt coughing with sips of liquids per nursing report. Multiple clinical swallowing evals completed during past hospitalizations recommending regular diet/thin liquids. PMH includes: CVA with L hemiparesis, GERD, PNA, and recent admission for influenza, aspiration, UTI. CT Cervical Spine in 2024 showed multilevel degenerative changes.   Clinical Impression SLP recommending PO diet of dys 2 (minced) solids, nectar thick liquids and will follow for toleration and ability to advance. Patient presents with a moderate oropharyngeal dysphagia as per this MBS. He exhibited silent aspiration of thin liquids which was worsened with straw sips or cup sips. Penetration above the vocal cords but no aspiration observed with nectar thick liquids. No penetration observed with honey thick liquids, mechanical soft solids, puree solids. Patient with delayed mastication, decreased efficiency with solids.   Factors that may increase risk of adverse event in presence of aspiration [employer] 2021): Dependence for feeding and/or oral hygiene;Frail or deconditioned;Limited mobility;Reduced cognitive function  DIGEST Swallow Severity Rating*  Safety: 2  Efficiency:1  Overall Pharyngeal Swallow Severity: 2 1: mild; 2: moderate; 3: severe; 4: profound  *The Dynamic Imaging Grade of Swallowing Toxicity is standardized for the head and neck cancer population, however, demonstrates promising clinical  applications across populations to standardize the clinical rating of pharyngeal swallow safety and severity.  Swallow Evaluation Recommendations Recommendations: PO diet PO Diet Recommendation: Dysphagia 2 (Finely chopped);Mildly thick liquids (Level 2, nectar thick) Liquid Administration via: Cup;No straw Medication Administration: Crushed with puree Supervision: Full assist for feeding;Staff to assist with self-feeding Swallowing strategies  : Minimize environmental distractions;Slow rate;Small bites/sips Postural changes: Stay upright 30-60 min after meals;Position pt fully upright for meals Oral care recommendations: Oral care BID (2x/day) Caregiver Recommendations: Avoid jello, ice cream, thin soups, popsicles;Remove water  pitcher      Norleen IVAR Blase, MA, CCC-SLP Speech Therapy  06/15/2024,1:42 PM

## 2024-06-16 LAB — URINE CULTURE: Culture: >=100000 — AB

## 2024-06-16 LAB — CBC
HCT: 37.8 % — ABNORMAL LOW (ref 39.0–52.0)
Hemoglobin: 12.3 g/dL — ABNORMAL LOW (ref 13.0–17.0)
MCH: 29.3 pg (ref 26.0–34.0)
MCHC: 32.5 g/dL (ref 30.0–36.0)
MCV: 90 fL (ref 80.0–100.0)
Platelets: 208 10*3/uL (ref 150–400)
RBC: 4.2 MIL/uL — ABNORMAL LOW (ref 4.22–5.81)
RDW: 13.6 % (ref 11.5–15.5)
WBC: 7.7 10*3/uL (ref 4.0–10.5)
nRBC: 0 % (ref 0.0–0.2)

## 2024-06-16 LAB — BASIC METABOLIC PANEL WITH GFR
Anion gap: 12 (ref 5–15)
BUN: 13 mg/dL (ref 8–23)
CO2: 23 mmol/L (ref 22–32)
Calcium: 8.4 mg/dL — ABNORMAL LOW (ref 8.9–10.3)
Chloride: 107 mmol/L (ref 98–111)
Creatinine, Ser: 0.69 mg/dL (ref 0.61–1.24)
GFR, Estimated: >60 mL/min
Glucose, Bld: 136 mg/dL — ABNORMAL HIGH (ref 70–99)
Potassium: 3.5 mmol/L (ref 3.5–5.1)
Sodium: 143 mmol/L (ref 135–145)

## 2024-06-16 LAB — GLUCOSE, CAPILLARY
Glucose-Capillary: 140 mg/dL — ABNORMAL HIGH (ref 70–99)
Glucose-Capillary: 127 mg/dL — ABNORMAL HIGH (ref 70–99)
Glucose-Capillary: 146 mg/dL — ABNORMAL HIGH (ref 70–99)
Glucose-Capillary: 203 mg/dL — ABNORMAL HIGH (ref 70–99)
Glucose-Capillary: 177 mg/dL — ABNORMAL HIGH (ref 70–99)

## 2024-06-16 MED ORDER — SODIUM CHLORIDE 0.9 % IR SOLN
3000.0000 mL | Status: DC
  Administered 2024-06-16 – 2024-06-17 (×2): 3000 mL

## 2024-06-16 MED ORDER — POTASSIUM CHLORIDE CRYS ER 20 MEQ PO TBCR
40.0000 meq | EXTENDED_RELEASE_TABLET | Freq: Once | ORAL | Status: AC
  Administered 2024-06-16: 40 meq via ORAL
  Filled 2024-06-16: qty 2

## 2024-06-16 NOTE — TOC Initial Note (Signed)
 Transition of Care [facility]) - Initial/Assessment Note    Patient Details  Name: Gregory Kerr MRN: [id number] Date of Birth: Aug 30, 1940  Transition of Care [facility]) CM/SW Contact:    Inocente GORMAN Kindle, LCSW Phone Number: 06/16/2024, 2:20 PM  Clinical Narrative:                 Patient admitted from [facility]. CSW will continue to follow for medical stability.    Expected Discharge Plan: Long Term Nursing Home Barriers to Discharge: Continued Medical Work up   Patient Goals and CMS Choice Patient states their goals for this hospitalization and ongoing recovery are:: Return to snf          Expected Discharge Plan and Services In-house Referral: Clinical Social Work   Post Acute Care Choice: Skilled Nursing Facility Living arrangements for the past 2 months: Skilled Nursing Facility                                      Prior Living Arrangements/Services Living arrangements for the past 2 months: Skilled Nursing Facility Lives with:: Facility Resident Patient language and need for interpreter reviewed:: Yes Do you feel safe going back to the place where you live?: Yes      Need for Family Participation in Patient Care: Yes (Comment) Care giver support system in place?: Yes (comment)   Criminal Activity/Legal Involvement Pertinent to Current Situation/Hospitalization: No - Comment as needed  Activities of Daily Living      Permission Sought/Granted Permission sought to share information with : Facility Medical Sales Representative, Family Supports Permission granted to share information with : Yes, Verbal Permission Granted     Permission granted to share info w AGENCY: [name]        Emotional Assessment Appearance:: Appears stated age Attitude/Demeanor/Rapport: Engaged Affect (typically observed): Accepting Orientation: : Oriented to Self, Oriented to Place, Oriented to  Time, Oriented to Situation Alcohol  / Substance Use: Not Applicable Psych Involvement:  No (comment)  Admission diagnosis:  Acute encephalopathy [G93.40] Urinary tract infection with hematuria, site unspecified [N39.0, R31.9] Altered mental status, unspecified altered mental status type [R41.82] Patient Active Problem List   Diagnosis Date Noted   Acute encephalopathy 06/12/2024   Gross hematuria 06/12/2024   Influenza A 05/30/2024   Multiple drug resistant organism (MDRO) culture positive 05/27/2024   Sepsis secondary to UTI (HCC) 05/23/2024   Urolithiasis 05/23/2024   AKI (acute kidney injury) 05/23/2024   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   BPH (benign prostatic hyperplasia) 05/23/2024   AAA (abdominal aortic aneurysm) 05/23/2024   Closed compression fracture of L3 vertebra (HCC) 05/23/2024   SIRS (systemic inflammatory response syndrome) (HCC) 02/11/2022   Bacteremia    Acute renal failure superimposed on stage 3a chronic kidney disease (HCC) 06/29/2021   Bladder spasm 06/29/2021   Sepsis (HCC) 06/28/2021   Spasticity 06/28/2021   Prolonged QT interval 06/28/2021   Constipation 06/28/2021   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 03/08/2021   Generalized weakness    Acute CVA (cerebrovascular accident) (HCC) 11/19/2020   Allergic rhinitis due to allergen 04/27/2016   Chest pain 02/27/2016   Thoracic ascending aortic aneurysm 02/27/2016   Leukocytosis 02/27/2016   Major depression, chronic 02/16/2016   Neuropathic pain 02/16/2016   Diarrhea 12/29/2015   Coronary artery disease involving native coronary artery of native heart with angina pectoris 11/12/2015   History of CVA with residual deficit 10/14/2015  Hyperlipidemia 07/16/2015   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) 12/10/2014   Angina pectoris 12/10/2014   Chronic obstructive pulmonary disease (HCC) 12/10/2014   Irritable bowel syndrome with diarrhea 12/10/2014   Senile osteoporosis 12/10/2014   CVA (cerebral vascular accident) (HCC) 12/10/2014   Acute on chronic diastolic CHF  (congestive heart failure) (HCC) 09/27/2014   Hypokalemia 09/23/2014   Pancreatitis, gallstone 09/22/2014   Chronic diastolic heart failure (HCC) 09/22/2014   Acute cholangitis (HCC) 09/22/2014   Gallstone pancreatitis 09/22/2014   Hiatal hernia with GERD 08/31/2014   Vitamin D  deficiency 08/03/2014   Hyperlipidemia LDL goal <100 08/03/2014   CVA, old, hemiparesis (HCC) 08/03/2014   Leg edema, left 08/03/2014   Gastroesophageal reflux disease without esophagitis 06/18/2014   Essential hypertension, benign 06/18/2014   Primary generalized hypertrophic osteoarthrosis 06/18/2014   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   UTI (urinary tract infection) 04/04/2014   Essential hypertension 04/03/2014   Other specified chronic obstructive pulmonary disease (HCC) 01/15/2014   Dermatophytosis of nail 12/18/2013   Essential hypertension 12/18/2013   Generalized osteoarthrosis, unspecified site 12/18/2013   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   GERD (gastroesophageal reflux disease) 08/11/2013   Disuse osteoporosis 08/11/2013   Chronic pain syndrome 01/30/2013   IBS (irritable bowel syndrome) 01/30/2013   Cerebral infarct (HCC) 10/24/2012   HTN (hypertension) 10/24/2012   Osteoporosis 10/24/2012   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   Other and unspecified hyperlipidemia 10/24/2012   Hiatal hernia 10/24/2012   PCP:  Donnella Agreste Pharmacy:   [facility] [place] [place] - [address] [place] [address] Phone: [phone] Fax: [phone]     Social Drivers of Health (SDOH) Social History: SDOH Screenings   Food Insecurity: Patient Unable To Answer (06/13/2024)  Housing: Patient Unable To Answer (06/13/2024)  Transportation Needs: Patient Unable To Answer (06/13/2024)  Utilities: Patient Unable To Answer (06/13/2024)  Social Connections: Patient Unable To Answer (06/13/2024)  Recent Concern: Social Connections - Socially  Isolated (05/31/2024)  Tobacco Use: Medium Risk (06/12/2024)   SDOH Interventions: Social Connections Interventions: Community Resources Provided, Inpatient TOC   Readmission Risk Interventions    02/15/2022   12:13 PM 10/13/2021   12:30 PM  Readmission Risk Prevention Plan  Transportation Screening Complete Complete  Medication Review Oceanographer) Complete Referral to Pharmacy  PCP or Specialist appointment within 3-5 days of discharge Complete Not Complete  PCP/Specialist Appt Not Complete comments  Still inpatient without discharge date at this time  HRI or Home Care Consult Complete Not Complete  HRI or Home Care Consult Pt Refusal Comments  Patient is LTC at SNF  SW Recovery Care/Counseling Consult Complete Complete  Palliative Care Screening Complete Not Applicable  Skilled Nursing Facility Complete Complete

## 2024-06-16 NOTE — Progress Notes (Signed)
 " Progress Note   Patient: Gregory Kerr FMW:[id number] DOB: 1940-06-20 DOA: 06/12/2024     4 DOS: the patient was seen and examined on 06/16/2024   Brief hospital course:  84 y.o. male with medical history significant for CVA with left-sided hemiparesis, BPH, and recent admissions for influenza, aspiration, Proteus and enterococcal UTI, and pseudomonal bacteremia who returns with altered mental status and malodorous bloody urine.   Patient is unable to provide much history.  He has reportedly been disoriented today and was noted to have bloody and foul-smelling urine suspicious for recurrent UTI.  Bilateral ureteral stents were placed on 05/23/2025.  He had been seen by ID during the recent hospitalization and completed a course of ciprofloxacin  after discharge.   ED Course: Upon arrival to the ED, patient is found to be afebrile and saturating 100% on 5 L/min of supplemental oxygen with normal HR and stable BP.  Labs are most notable for elevated BUN to creatinine ratio, normal WBC, and normal lactate.   Blood and urine cultures were collected in the ED and the patient was given a liter of NS and Rocephin .  Assessment and Plan:  Acute metabolic encephalopathy  - No acute findings on head CT  - Likely secondary to UTI  - Use delirium precautions, expand workup if fails to improve as expected with treatment of UTI  - Mentation much improved, some underlying mild dysarthria, he is soft-spoken at baseline, but appears back at baseline.   Acute hemorrhagic cystitis  Hematuria with history of nephrolithiasis ureteral stent placement history of nephrolithiasis and bladder stone - Bilateral JJ stents placed on 05/23/24, now presenting with recurrent UTI and gross hematuria  - Recent culture data reviewed, Pseudomonas bacteremia, and UTI growing Enterococcus and Proteus, for now continue with IV Zosyn  . - Urology consult appreciated, Foley catheter inserted, with bladder irrigation.  No further  hematuria, voiding trial, likely tomorrow after discussing with urology - Urine culture has been sent but urinalysis unable to be processed due to gross blood. - This morning patient still with hematuria despite being on CBI overnight, discussed with urology, will continue with CBI and reevaluate in AM.  Will hold on voiding trial currently.   Hx of CVA  - Hold Plavix  for now, continue Lipitor  -Chronic left-sided hemiparesis.   Acute hypoxic respiratory failure  Pneumonia Dysphagia -Patient with recent hospitalization due to influenza infection, finished treatment - Requiring supplemental O2 in ED  - CT renal protocol significant fluid right lung base pneumonia - Continue Zosyn  - Concern for aspiration, SLP input greatly appreciated, status post MBS today, continue dysphagia 2, nectar thick liquid, discussed with staff, only to feed while sitting up.  Mild anemia:  - Chronic, mild, no indication for transfusion.     DVT prophylaxis: SCDs holding chemical DVT prophylaxis due to hematuria Code Status: DNR/DNI Level of Care: Level of care: Progressive Family Communication: D/W cousin Vina 1/25. Disposition Plan:  Patient is from: SNF  Anticipated d/c is to: SNF       Subjective: Patient denies any complaints this morning,   Physical Exam: Vitals:   06/16/24 0400 06/16/24 0710 06/16/24 0802 06/16/24 1150  BP: (!) 155/73  (!) 140/67 (!) 155/79  Pulse: 83  84 87  Resp: (!) 21   20  Temp: 98.3 F (36.8 C)  98.8 F (37.1 C) 98.4 F (36.9 C)  TempSrc: Oral  Axillary Oral  SpO2: 93%  94% 94%  Weight:  74.1 kg    Height:  Awake Alert, upright, no apparent distress left-sided hemiparesis with contraction Good air entry, but upper airway rhonchi Regular rate and rhythm, e +ve B.Sounds, Abd Soft, he has suprapubic tenderness No Cyanosis, Clubbing or edema, left side contracted   Disposition: Status is: Inpatient     Author: Brayton Lye, MD 06/16/2024  12:46 PM  For on call review [email or web address].  "

## 2024-06-16 NOTE — Plan of Care (Signed)

## 2024-06-16 NOTE — Progress Notes (Signed)
 "    Subjective: He has change in mentation since first seeing [name].  He was alert and conversational on rounds this morning.  We reviewed the case and plan and all questions were answered.  CBI had run dry on rounds.  Medium pink urine/irrigant.  Objective: Vital signs in last 24 hours: Temp:  [98.3 F (36.8 C)-98.8 F (37.1 C)] 98.6 F (37 C) (01/26 1600) Pulse Rate:  [83-100] 87 (01/26 1150) Resp:  [20-22] 20 (01/26 1150) BP: (140-163)/(67-89) 163/89 (01/26 1600) SpO2:  [91 %-96 %] 91 % (01/26 1600) Weight:  [74.1 kg] 74.1 kg (01/26 0710)  Assessment/Plan: #gross hematuria #bilateral ureteral stents #bladder stone #UTI  Bilateral ureteral stent placement with Dr. Elisabeth on 05/23/2024.  Will still need definitive stone management after he clears infection.  Three-way Foley catheter placed in the emergency department without hematuria.  Urology has followed peripherally for the last couple days and unfortunately his hematuria has returned as of this morning.  CBI re-started.  Bags dry on rounds.  Hung to new bags and titrated to low/medium gtt.  Still washing out Plavix .  He will continue to have some degree of hematuria while receiving Plavix  while stents are in place.  Hgb stable at this time.  If his hematuria is minimal to clear,  we will proceed with a voiding trial tomorrow morning.  He again confirms that he did not have difficulty urinating prior to being in the hospital.  Culture again positive for resistant Proteus and Enterococcus.  On broad ABX at this time per primary team.  Okay for discharge from a urologic perspective if he passes voiding trial.  Intake/Output from previous day: 01/25 0701 - 01/26 0700 In: 3469.5 [I.V.:469.5] Out: 5500 [Urine:5500]  Intake/Output this shift: Total I/O In: 720 [P.O.:720] Out: 1900 [Urine:1900]  Physical Exam:  General: Alert and oriented CV: No cyanosis Lungs: equal chest rise Gu: Three-way Foley catheter in place with CBI  on low/medium gtt.  Light pink irrigant  Lab Results: Recent Labs    06/14/24 0613 06/15/24 0600 06/16/24 0244  HGB 11.9* 12.0* 12.3*  HCT 36.7* 37.1* 37.8*   BMET Recent Labs    06/15/24 0600 06/16/24 0244  NA 142 143  K 3.9 3.5  CL 106 107  CO2 20* 23  GLUCOSE 66* 136*  BUN 13 13  CREATININE 0.63 0.69  CALCIUM  8.2* 8.4*  HGB 12.0* 12.3*  WBC 7.7 7.7     Studies/Results: DG Swallowing Func-Speech Pathology Result Date: 06/15/2024 Table formatting from the original result was not included. Modified Barium Swallow Study Patient Details Name: Gregory Kerr MRN: [id number] Date of Birth: 17-Sep-1940 Today's Date: 06/15/2024 HPI/PMH: HPI: Pt is an 84 yo male presenting 1/22 with AMS. Admitted with acute encephalopathy (CT Head negative), UTI, and acute hypoxic respiratory failure. CXR without focal consolidation but did reveal large hiatal hernia. SLP consulted for swallow eval due to pt coughing with sips of liquids per nursing report. Multiple clinical swallowing evals completed during past hospitalizations recommending regular diet/thin liquids. PMH includes: CVA with L hemiparesis, GERD, PNA, and recent admission for influenza, aspiration, UTI. CT Cervical Spine in 2024 showed multilevel degenerative changes. Clinical Impression: SLP recommending PO diet of dys 2 (minced) solids, nectar thick liquids and will follow for toleration and ability to advance. Patient presents with a moderate oropharyngeal dysphagia as per this MBS. He exhibited silent aspiration of thin liquids which was worsened with straw sips or cup sips. Penetration above the vocal cords but  no aspiration observed with nectar thick liquids. No penetration observed with honey thick liquids, mechanical soft solids, puree solids. Patient with delayed mastication, decreased efficiency with solids. Factors that may increase risk of adverse event in presence of aspiration Noe & Lianne 2021): Dependence for feeding and/or  oral hygiene;Frail or deconditioned;Limited mobility;Reduced cognitive function DIGEST Swallow Severity Rating*  Safety: 2  Efficiency:1  Overall Pharyngeal Swallow Severity: 2 1: mild; 2: moderate; 3: severe; 4: profound *The Dynamic Imaging Grade of Swallowing Toxicity is standardized for the head and neck cancer population, however, demonstrates promising clinical applications across populations to standardize the clinical rating of pharyngeal swallow safety and severity. Recommendations/Plan: Swallowing Evaluation Recommendations Swallowing Evaluation Recommendations Recommendations: PO diet PO Diet Recommendation: Dysphagia 2 (Finely chopped); Mildly thick liquids (Level 2, nectar thick) Liquid Administration via: Cup; No straw Medication Administration: Crushed with puree Supervision: Full assist for feeding; Staff to assist with self-feeding Swallowing strategies  : Minimize environmental distractions; Slow rate; Small bites/sips Postural changes: Stay upright 30-60 min after meals; Position pt fully upright for meals Oral care recommendations: Oral care BID (2x/day) Caregiver Recommendations: Avoid jello, ice cream, thin soups, popsicles; Remove water  pitcher Treatment Plan Treatment Plan Treatment recommendations: Therapy as outlined in treatment plan below Follow-up recommendations: Skilled nursing-short term rehab (<3 hours/day) Functional status assessment: Patient has had a recent decline in their functional status and demonstrates the ability to make significant improvements in function in a reasonable and predictable amount of time. Treatment frequency: Min 2x/week Treatment duration: 2 weeks Interventions: Aspiration precaution training; Diet toleration management by SLP; Compensatory techniques; Patient/family education; Trials of upgraded texture/liquids Recommendations Recommendations for follow up therapy are one component of a multi-disciplinary discharge planning process, led by the attending  physician.  Recommendations may be updated based on patient status, additional functional criteria and insurance authorization. Assessment: Orofacial Exam: Orofacial Exam Oral Cavity: Oral Hygiene: WFL Oral Cavity - Dentition: Missing dentition Orofacial Anatomy: WFL Anatomy: Anatomy: Suspected cervical osteophytes Boluses Administered: Boluses Administered Boluses Administered: Thin liquids (Level 0); Mildly thick liquids (Level 2, nectar thick); Moderately thick liquids (Level 3, honey thick); Puree; Solid  Oral Impairment Domain: Oral Impairment Domain Lip Closure: No labial escape Tongue control during bolus hold: Posterior escape of greater than half of bolus Bolus preparation/mastication: Slow prolonged chewing/mashing with complete recollection Bolus transport/lingual motion: Slow tongue motion Oral residue: Complete oral clearance Location of oral residue : N/A Initiation of pharyngeal swallow : Valleculae; Posterior laryngeal surface of the epiglottis  Pharyngeal Impairment Domain: Pharyngeal Impairment Domain Soft palate elevation: No bolus between soft palate (SP)/pharyngeal wall (PW) Laryngeal elevation: Complete superior movement of thyroid  cartilage with complete approximation of arytenoids to epiglottic petiole Anterior hyoid excursion: Partial anterior movement Epiglottic movement: Complete inversion Laryngeal vestibule closure: Incomplete, narrow column air/contrast in laryngeal vestibule Pharyngeal stripping wave : Present - complete Pharyngeal contraction (A/P view only): N/A Pharyngoesophageal segment opening: Complete distension and complete duration, no obstruction of flow Tongue base retraction: Trace column of contrast or air between tongue base and PPW Pharyngeal residue: Collection of residue within or on pharyngeal structures Location of pharyngeal residue: Valleculae; Pharyngeal wall; Pyriform sinuses  Esophageal Impairment Domain: Esophageal Impairment Domain Esophageal clearance upright  position: Complete clearance, esophageal coating Pill: No data recorded Penetration/Aspiration Scale Score: Penetration/Aspiration Scale Score 1.  Material does not enter airway: Moderately thick liquids (Level 3, honey thick); Puree; Solid 2.  Material enters airway, remains ABOVE vocal cords then ejected out: Mildly thick liquids (Level 2, nectar thick) 5.  Material enters airway, CONTACTS cords and not ejected out: Thin liquids (Level 0) 8.  Material enters airway, passes BELOW cords without attempt by patient to eject out (silent aspiration) : Thin liquids (Level 0) Compensatory Strategies: Compensatory Strategies Compensatory strategies: Yes Straw: Ineffective Ineffective Straw: Thin liquid (Level 0)   General Information: No data recorded Diet Prior to this Study: NPO   Temperature : Normal   Respiratory Status: WFL   Supplemental O2: None (Room air)   History of Recent Intubation: No  Behavior/Cognition: Alert; Cooperative; Requires cueing; Pleasant mood Self-Feeding Abilities: Dependent for feeding Baseline vocal quality/speech: Hypophonia/low volume Volitional Cough: Able to elicit Volitional Swallow: Able to elicit Exam Limitations: Other (comment) (no limitations but positioning of patient was difficult) Goal Planning: Prognosis for improved oropharyngeal function: Good No data recorded No data recorded Patient/Family Stated Goal: nothing stated Consulted and agree with results and recommendations: Patient Pain: Pain Assessment Pain Assessment: Faces Faces Pain Scale: 2 Pain Location: sacrum and low back with movement Pain Descriptors / Indicators: Discomfort; Grimacing Pain Intervention(s): Monitored during session End of Session: Start Time:SLP Start Time (ACUTE ONLY): 1038 Stop Time: SLP Stop Time (ACUTE ONLY): 1100 Time Calculation:SLP Time Calculation (min) (ACUTE ONLY): 22 min Charges: No data recorded SLP visit diagnosis: SLP Visit Diagnosis: Dysphagia, unspecified (R13.10) Past Medical History:  Past Medical History: Diagnosis Date  GERD (gastroesophageal reflux disease)   Hernia   large one; on my left stomach (09/22/2014)  Hiatal hernia 10/24/2012  HTN (hypertension) 10/24/2012  Hyperlipidemia   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  Osteoporosis, unspecified 10/24/2012  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  Pneumonia   once or twice (09/22/2014)  Retinal detachment   left eye  Stroke (HCC) 2007  left side doesn't work now (09/22/2014) Past Surgical History: Past Surgical History: Procedure Laterality Date  APPENDECTOMY  05/27/2009  Jeoffrey Dawn, MD  CATARACT EXTRACTION W/ INTRAOCULAR LENS  IMPLANT, BILATERAL Bilateral ?2014  CHOLECYSTECTOMY N/A 09/26/2014  Procedure: LAPAROSCOPIC CHOLECYSTECTOMY WITH INTRAOPERATIVE CHOLANGIOGRAM;  Surgeon: Donnice Lima, MD;  Location: [facility] OR;  Service: General;  Laterality: N/A;  COLONOSCOPY WITH PROPOFOL  N/A 12/29/2015  Procedure: COLONOSCOPY WITH PROPOFOL ;  Surgeon: Jerrell Sol, MD;  Location: [facility] ENDOSCOPY;  Service: Endoscopy;  Laterality: N/A;  CYSTOSCOPY W/ URETERAL STENT PLACEMENT Bilateral 05/23/2024  Procedure: CYSTOSCOPY WITH LEFT RETROGRADE PYELOGRAM AND BILATARAL URETERAL STENT INSERTION;  Surgeon: Elisabeth Valli BIRCH, MD;  Location: [facility] OR;  Service: Urology;  Laterality: Bilateral;  ERCP N/A 09/25/2014  Procedure: ENDOSCOPIC RETROGRADE CHOLANGIOPANCREATOGRAPHY (ERCP);  Surgeon: Oliva Boots, MD;  Location[facility] ENDOSCOPY;  Service: Endoscopy;  Laterality: N/A;  IR CATHETER TUBE CHANGE  05/24/2021  PARS PLANA VITRECTOMY Left 01/30/2019  Procedure: PARS PLANA VITRECTOMY WITH 25 GAUGE;  Surgeon: Jarold Mayo, MD;  Location: [facility] OR;  Service: Ophthalmology;  Laterality: Left;  PARS PLANA VITRECTOMY Right 11/13/2019  Procedure: PARS PLANA VITRECTOMY WITH 25 GAUGE WITH REMOVAL OF POSTERIOR CAPSULAR OPACIFICATION WITH VITRECTOR;  Surgeon: Jarold Mayo, MD;  Location: [facility] OR;  Service: Ophthalmology;  Laterality: Right;  PHOTOCOAGULATION WITH LASER Left 01/30/2019  Procedure:  PHOTOCOAGULATION WITH LASER;  Surgeon: Jarold Mayo, MD;  Location: [facility] OR;  Service: Ophthalmology;  Laterality: Left; Norleen IVAR Blase, MA, CCC-SLP Speech Therapy 06/15/2024, 1:44 PM     LOS: 4 days   Ole Bourdon, NP [facility] Pager: [phone]  06/16/2024, 6:09 PM  "

## 2024-06-16 NOTE — Progress Notes (Signed)
 Speech Language Pathology Treatment: Dysphagia  Patient Details Name: Gregory Kerr MRN: [id number] DOB: 13-Aug-1940 Today's Date: 06/16/2024 Time: 8759-8743 SLP Time Calculation (min) (ACUTE ONLY): 16 min  Assessment / Plan / Recommendation Clinical Impression  Plan: continue dys 2, nectar thick. SLP will continue to follow.  Patient seen by SLP for skilled treatment focused on dysphagia goals. RN reported that he has tolerated PO's without coughing or observed difficulty today. Patient's swallow assessed at bedside with nectar thick milk and juice. He was able to hold cup with setup assistance and drink through straw. Audible congestion heard throughout session but did not appear directly related to PO intake.   HPI HPI: Pt is an 84 yo male presenting 1/22 with AMS. Admitted with acute encephalopathy (CT Head negative), UTI, and acute hypoxic respiratory failure. CXR without focal consolidation but did reveal large hiatal hernia. SLP consulted for swallow eval due to pt coughing with sips of liquids per nursing report. Multiple clinical swallowing evals completed during past hospitalizations recommending regular diet/thin liquids. PMH includes: CVA with L hemiparesis, GERD, PNA, and recent admission for influenza, aspiration, UTI. CT Cervical Spine in 2024 showed multilevel degenerative changes.      SLP Plan  Continue with current plan of care        Swallow Evaluation Recommendations         Recommendations  Diet recommendations: Dysphagia 2 (fine chop);Nectar-thick liquid Liquids provided via: Cup;Straw Medication Administration: Crushed with puree Supervision: Patient able to self feed;Staff to assist with self feeding;Full supervision/cueing for compensatory strategies Compensations: Slow rate;Small sips/bites Postural Changes and/or Swallow Maneuvers: Seated upright 90 degrees                  Oral care BID;Staff/trained caregiver to provide oral care   Set up  Supervision/Assistance Dysphagia, unspecified (R13.10)     Continue with current plan of care    Norleen IVAR Blase, MA, CCC-SLP Speech Therapy   06/16/2024, 2:43 PM

## 2024-06-16 NOTE — Progress Notes (Signed)
 RT attempted to place chest vest on pt when pt said he did not want it. RT will swing back by again  later.

## 2024-06-16 NOTE — NC FL2 (Signed)
 " [place]  MEDICAID FL2 LEVEL OF CARE FORM     IDENTIFICATION  Patient Name: Gregory Kerr Birthdate: 1941-01-10 Sex: male Admission Date (Current Location): 06/12/2024  [facility] [place] and [place] Number:  Producer, Television/film/video and Address:  [facility] [place], [address], [place], [place] [address]      Provider Number: [id number]  Attending Physician Name and Address:  Elgergawy, Brayton RAMAN, MD  Relative Name and Phone Number:       Current Level of Care: Hospital Recommended Level of Care: Skilled Nursing Facility Prior Approval Number:    Date Approved/Denied:   PASRR Number: [id number]  Discharge Plan: SNF    Current Diagnoses: Patient Active Problem List   Diagnosis Date Noted   Acute encephalopathy 06/12/2024   Gross hematuria 06/12/2024   Influenza A 05/30/2024   Multiple drug resistant organism (MDRO) culture positive 05/27/2024   Sepsis secondary to UTI (HCC) 05/23/2024   Urolithiasis 05/23/2024   AKI (acute kidney injury) 05/23/2024   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   BPH (benign prostatic hyperplasia) 05/23/2024   AAA (abdominal aortic aneurysm) 05/23/2024   Closed compression fracture of L3 vertebra (HCC) 05/23/2024   SIRS (systemic inflammatory response syndrome) (HCC) 02/11/2022   Bacteremia    Acute renal failure superimposed on stage 3a chronic kidney disease (HCC) 06/29/2021   Bladder spasm 06/29/2021   Sepsis (HCC) 06/28/2021   Spasticity 06/28/2021   Prolonged QT interval 06/28/2021   Constipation 06/28/2021   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 03/08/2021   Generalized weakness    Acute CVA (cerebrovascular accident) (HCC) 11/19/2020   Allergic rhinitis due to allergen 04/27/2016   Chest pain 02/27/2016   Thoracic ascending aortic aneurysm 02/27/2016   Leukocytosis 02/27/2016   Major depression, chronic 02/16/2016   Neuropathic pain 02/16/2016   Diarrhea 12/29/2015   Coronary artery disease involving native coronary  artery of native heart with angina pectoris 11/12/2015   History of CVA with residual deficit 10/14/2015   Hyperlipidemia 07/16/2015   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) 12/10/2014   Angina pectoris 12/10/2014   Chronic obstructive pulmonary disease (HCC) 12/10/2014   Irritable bowel syndrome with diarrhea 12/10/2014   Senile osteoporosis 12/10/2014   CVA (cerebral vascular accident) (HCC) 12/10/2014   Acute on chronic diastolic CHF (congestive heart failure) (HCC) 09/27/2014   Hypokalemia 09/23/2014   Pancreatitis, gallstone 09/22/2014   Chronic diastolic heart failure (HCC) 09/22/2014   Acute cholangitis (HCC) 09/22/2014   Gallstone pancreatitis 09/22/2014   Hiatal hernia with GERD 08/31/2014   Vitamin D  deficiency 08/03/2014   Hyperlipidemia LDL goal <100 08/03/2014   CVA, old, hemiparesis (HCC) 08/03/2014   Leg edema, left 08/03/2014   Gastroesophageal reflux disease without esophagitis 06/18/2014   Essential hypertension, benign 06/18/2014   Primary generalized hypertrophic osteoarthrosis 06/18/2014   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   UTI (urinary tract infection) 04/04/2014   Essential hypertension 04/03/2014   Other specified chronic obstructive pulmonary disease (HCC) 01/15/2014   Dermatophytosis of nail 12/18/2013   Essential hypertension 12/18/2013   Generalized osteoarthrosis, unspecified site 12/18/2013   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   GERD (gastroesophageal reflux disease) 08/11/2013   Disuse osteoporosis 08/11/2013   Chronic pain syndrome 01/30/2013   IBS (irritable bowel syndrome) 01/30/2013   Cerebral infarct (HCC) 10/24/2012   HTN (hypertension) 10/24/2012   Osteoporosis 10/24/2012   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   Other and unspecified hyperlipidemia 10/24/2012   Hiatal hernia 10/24/2012  Orientation RESPIRATION BLADDER Height & Weight     Self, Time, Situation, Place   Normal Incontinent, Indwelling catheter Weight: 163 lb 5.8 oz (74.1 kg) Height:  5' 6 (167.6 cm)  BEHAVIORAL SYMPTOMS/MOOD NEUROLOGICAL BOWEL NUTRITION STATUS      Incontinent Diet (See dc summary)  AMBULATORY STATUS COMMUNICATION OF NEEDS Skin   Extensive Assist Verbally PU Stage and Appropriate Care, Other (Comment) (Stage I on coccyx; infection on finger)                       Personal Care Assistance Level of Assistance  Bathing, Feeding, Dressing Bathing Assistance: Maximum assistance Feeding assistance: Limited assistance Dressing Assistance: Maximum assistance     Functional Limitations Info  Sight, Hearing Sight Info: Impaired Hearing Info: Impaired      SPECIAL CARE FACTORS FREQUENCY                       Contractures Contractures Info: Not present    Additional Factors Info  Code Status, Allergies Code Status Info: DNR Allergies Info: NKA           Current Medications (06/16/2024):  This is the current hospital active medication list Current Facility-Administered Medications  Medication Dose Route Frequency Provider Last Rate Last Admin   acetaminophen  (TYLENOL ) tablet 650 mg  650 mg Oral Q6H PRN Opyd, Timothy S, MD   650 mg at 06/13/24 9051   Or   acetaminophen  (TYLENOL ) suppository 650 mg  650 mg Rectal Q6H PRN Opyd, Timothy S, MD       atorvastatin  (LIPITOR) tablet 40 mg  40 mg Oral QPM Opyd, Timothy S, MD   40 mg at 06/15/24 1821   Chlorhexidine  Gluconate Cloth 2 % PADS 6 each  6 each Topical Daily Elpidio Reyes DEL, MD   6 each at 06/16/24 0815   hydrALAZINE  (APRESOLINE ) injection 10 mg  10 mg Intravenous Q6H PRN Elgergawy, Dawood S, MD   10 mg at 06/15/24 1821   ipratropium-albuterol  (DUONEB) 0.5-2.5 (3) MG/3ML nebulizer solution 3 mL  3 mL Nebulization Q6H PRN Opyd, Timothy S, MD   3 mL at 06/13/24 9486   piperacillin -tazobactam (ZOSYN ) IVPB 3.375 g  3.375 g Intravenous Q8H Opyd, Timothy S, MD 12.5 mL/hr at 06/16/24 1309 3.375 g at 06/16/24  1309   senna-docusate (Senokot-S) tablet 1 tablet  1 tablet Oral QHS PRN Opyd, Timothy S, MD       sodium chloride  flush (NS) 0.9 % injection 3 mL  3 mL Intravenous Q12H Opyd, Timothy S, MD   3 mL at 06/16/24 0815   sodium chloride  irrigation 0.9 % 3,000 mL  3,000 mL Irrigation Continuous Walden, Jeffrey H, MD   3,000 mL at 06/13/24 1940   sodium chloride  irrigation 0.9 % 3,000 mL  3,000 mL Irrigation Continuous Elgergawy, Dawood S, MD       tamsulosin  (FLOMAX ) capsule 0.4 mg  0.4 mg Oral QHS Opyd, Timothy S, MD   0.4 mg at 06/13/24 2113     Discharge Medications: Please see discharge summary for a list of discharge medications.  Relevant Imaging Results:  Relevant Lab Results:   Additional Information [id number]  Inocente GORMAN Kindle, LCSW     "

## 2024-06-17 LAB — GLUCOSE, CAPILLARY
Glucose-Capillary: 159 mg/dL — ABNORMAL HIGH (ref 70–99)
Glucose-Capillary: 154 mg/dL — ABNORMAL HIGH (ref 70–99)
Glucose-Capillary: 161 mg/dL — ABNORMAL HIGH (ref 70–99)
Glucose-Capillary: 133 mg/dL — ABNORMAL HIGH (ref 70–99)

## 2024-06-17 LAB — CBC
HCT: 36.6 % — ABNORMAL LOW (ref 39.0–52.0)
Hemoglobin: 12.1 g/dL — ABNORMAL LOW (ref 13.0–17.0)
MCH: 29.5 pg (ref 26.0–34.0)
MCHC: 33.1 g/dL (ref 30.0–36.0)
MCV: 89.3 fL (ref 80.0–100.0)
Platelets: 187 10*3/uL (ref 150–400)
RBC: 4.1 MIL/uL — ABNORMAL LOW (ref 4.22–5.81)
RDW: 13.8 % (ref 11.5–15.5)
WBC: 8.8 10*3/uL (ref 4.0–10.5)
nRBC: 0 % (ref 0.0–0.2)

## 2024-06-17 LAB — BASIC METABOLIC PANEL WITH GFR
Anion gap: 12 (ref 5–15)
BUN: 17 mg/dL (ref 8–23)
CO2: 23 mmol/L (ref 22–32)
Calcium: 8.4 mg/dL — ABNORMAL LOW (ref 8.9–10.3)
Chloride: 108 mmol/L (ref 98–111)
Creatinine, Ser: 0.58 mg/dL — ABNORMAL LOW (ref 0.61–1.24)
GFR, Estimated: >60 mL/min
Glucose, Bld: 140 mg/dL — ABNORMAL HIGH (ref 70–99)
Potassium: 3.6 mmol/L (ref 3.5–5.1)
Sodium: 143 mmol/L (ref 135–145)

## 2024-06-17 LAB — CULTURE, BLOOD (ROUTINE X 2)
Culture: NO GROWTH
Culture: NO GROWTH
Special Requests: ADEQUATE

## 2024-06-17 MED ORDER — SODIUM CHLORIDE 0.9 % IV SOLN
3.0000 g | Freq: Three times a day (TID) | INTRAVENOUS | Status: DC
  Administered 2024-06-17 – 2024-06-19 (×6): 3 g via INTRAVENOUS
  Filled 2024-06-17 (×6): qty 8

## 2024-06-17 MED ORDER — GUAIFENESIN ER 600 MG PO TB12
1200.0000 mg | ORAL_TABLET | Freq: Two times a day (BID) | ORAL | Status: DC
  Administered 2024-06-17 – 2024-06-19 (×5): 1200 mg via ORAL
  Filled 2024-06-17 (×5): qty 2

## 2024-06-17 NOTE — Plan of Care (Signed)
  Problem: Clinical Measurements: Goal: Will remain free from infection Outcome: Progressing Goal: Respiratory complications will improve Outcome: Progressing   Problem: Safety: Goal: Ability to remain free from injury will improve Outcome: Progressing   Problem: Skin Integrity: Goal: Risk for impaired skin integrity will decrease Outcome: Progressing   

## 2024-06-17 NOTE — Progress Notes (Signed)
" °  Progress Note   Patient: Gregory Kerr FMW:[id number] DOB: 1941/01/26 DOA: 06/12/2024     5 DOS: the patient was seen and examined on 06/17/2024   Brief hospital course:  84 y.o. male with medical history significant for CVA with left-sided hemiparesis, BPH, and recent admissions for influenza, aspiration, Proteus and enterococcal UTI, and pseudomonal bacteremia who returns with altered mental status and malodorous bloody urine. - Patient with recent hospitalization for bilateral ureteral stents were placed on 05/23/2025.  UTI and bacteremia, he had been seen by ID during the recent hospitalization and completed a course of ciprofloxacin  after discharge.  -Workup Significant for gross hematuria, required Foley insertion with CBI  Assessment and Plan:  Acute metabolic encephalopathy  - No acute findings on head CT  - Likely secondary to UTI  - Use delirium precautions, expand workup if fails to improve as expected with treatment of UTI  - Mentation much improved, some underlying mild dysarthria, he is soft-spoken at baseline, difficult to understand but appears back at baseline.   Acute hemorrhagic cystitis  Hematuria with history of nephrolithiasis ureteral stent placement history of nephrolithiasis and bladder stone - Bilateral JJ stents placed on 05/23/24, now presenting with recurrent UTI and gross hematuria  - Recent culture data reviewed, Pseudomonas bacteremia, and UTI growing Enterococcus and Proteus, f with IV Zosyn , urine culture has been finalized this admission growing Enterococcus and Proteus Mirabella's, pansensitive, so he was narrowed to Unasyn . - GI input appreciated, currently with three-way Foley with CBI. - He is expected to have recurrent hematuria while on Plavix  with bilateral stents.  Hx of CVA  - Hold Plavix  for now, continue Lipitor  -Chronic left-sided hemiparesis.   Acute hypoxic respiratory failure  Pneumonia Dysphagia -Patient with recent hospitalization due  to influenza infection, finished treatment - Requiring supplemental O2 in ED  - CT renal protocol significant fluid right lung base pneumonia - Continue Zosyn  - Concern for aspiration, SLP input greatly appreciated, status post MBS continue dysphagia 2, nectar thick liquid, discussed with staff, only to feed while sitting up. - Clearly having difficulty to clear his secretions, RT following closely with chest PT, and as needed NTS  Mild anemia:  - Chronic, mild, no indication for transfusion.     DVT prophylaxis: SCDs holding chemical DVT prophylaxis due to hematuria Code Status: DNR/DNI Level of Care: Level of care: Progressive Family Communication: D/W cousin Vina 1/25, 1/26 Disposition Plan:  Patient is from: SNF  Anticipated d/c is to: SNF       Subjective:  Patient reported he is having difficulty to clear his secretions   Physical Exam: Vitals:   06/17/24 0500 06/17/24 0754 06/17/24 0820 06/17/24 1153  BP:   136/60 (!) 154/84  Pulse:   95 95  Resp:  19    Temp:   97.9 F (36.6 C) 98.4 F (36.9 C)  TempSrc:   Oral Oral  SpO2:  94% 91% 93%  Weight: 74.6 kg     Height:         Awake, alert, in no apparent distress, left hemiparesis with contraction, oriented x 3, at baseline he has some he fainted voice difficult to hear.   Good air entry, but upper airway rhonchi Regular rate and rhythm,  +ve B.Sounds, A does have suprapubic tenderness. No Cyanosis, Clubbing or edema, left side contracted   Disposition: Status is: Inpatient     Author: Brayton Lye, MD 06/17/2024 12:32 PM  For on call review [email or web address].  "

## 2024-06-17 NOTE — Progress Notes (Signed)
 "                        PROGRESS NOTE        PATIENT DETAILS Name: Gregory Kerr Age: 84 y.o. Sex: male Date of Birth: Sep 22, 1940 Admit Date: 06/12/2024 Admitting Physician Gregory GORMAN Sprinkles, MD Gregory Kerr, Gregory Kerr  Brief Summary: Patient is a 85 y.o.  male with history of CVA s/p left hemiparesis, recent hospitalization for B/L ureteral stent-bacteremia-presented with altered mental status/hematuria  Significant events: 1/1-1/7>> hospitalization-sepsis secondary to UTI-has B/L hydronephrosis-large bladder stone-s/p B/L stent placement. 1/9-1/16>> hospitalization-sepsis secondary to influenza-Proteus/Enterococcus UTI 1/22>> admit to [facility]/hematuria  Significant studies: 1/22>> CXR: No PNA 1/22>> CT head: No acute intracranial abnormality 1/23>> CT renal stone study: B/L ureteral stents in adequate position.  Significant microbiology data: 1/02>> blood culture: Proteus/Pseudomonas 1/02>> urine culture: Proteus/Enterococcus 1/09>> blood culture: No growth 1/22>> urine culture: Enterococcus/Proteus 1/22>> blood culture: No growth  Procedures: 1/02>> cystoscopy with B/L ureteral stent placement  Consults: Urology  Subjective: No major issues overnight-clear urine in Foley catheter.  Objective: Vitals: Blood pressure (!) 154/84, pulse 95, temperature 98.4 F (36.9 C), temperature source Oral, resp. rate 19, height 5' 6 (1.676 m), weight 74.6 kg, SpO2 93%.   Exam: Gen Exam:Alert awake-not in any distress HEENT:atraumatic, normocephalic Chest: B/L clear to auscultation anteriorly CVS:S1S2 regular Abdomen:soft non tender, non distended Extremities:no edema Neurology: Left-sided hemiplegia. Skin: no rash  Pertinent Labs/Radiology:    Latest Ref Rng & Units 06/17/2024    3:27 AM 06/16/2024    2:44 AM 06/15/2024    6:00 AM  CBC  WBC 4.0 - 10.5 K/uL 8.8  7.7  7.7   Hemoglobin 13.0 - 17.0 g/dL 87.8  87.6  87.9   Hematocrit 39.0 - 52.0 % 36.6  37.8  37.1   Platelets  150 - 400 K/uL 187  208  209     Lab Results  Component Value Date   NA 143 06/17/2024   K 3.6 06/17/2024   CL 108 06/17/2024   CO2 23 06/17/2024      Assessment/Plan: Acute metabolic encephalopathy Secondary to complicated UTI Improved with treatment of underlying etiology-suspect close to baseline.  Complicated UTI-hemorrhagic cystitis On Unasyn -three-way Foley with CBI-hematuria seems to have resolved-clear urine this morning. Urology following-Will defer further CBI/voiding trial to urology Will need discussed with ID-probably needs a longer duration of treatment-as stents are probably infected.  Recent history of bladder stone-pseudomonal bacteremia-Proteus/enterococcal UTI-s/p B/L ureteral stent placement-1/02 Urology following Plans are for definite stone management in the outpatient setting when UTI symptoms have resolved.  History of CVA with left-sided hemiparesis Plavix  on hold due to hemorrhagic cystitis Lipitor  Acute hypoxic respiratory failure secondary to atelectasis/aspiration pneumonitis Improved-on Unasyn  SLP following-seems to be tolerating dysphagia to diet with nectar thick liquids Incentive spirometry/flutter valve/Chest PT/as needed nasotracheal suctioning  Normocytic anemia Mild Likely secondary to critical illness Follow CBC periodically.  Pressure Ulcer: Agree with assessment as outlined below. Wound 06/13/24 0535 Pressure Injury Coccyx Posterior Stage 1 -  Intact skin with non-blanchable redness of a localized area usually over a bony prominence. (Active)   Estimated body mass index is 26.55 kg/m as calculated from the following:   Height as of this encounter: 5' 6 (1.676 m).   Weight as of this encounter: 74.6 kg.   Code status:   Code Status: Limited: Do not attempt resuscitation (DNR) -DNR-LIMITED -Do Not Intubate/DNI    DVT Prophylaxis: Place and maintain sequential compression device  Start: 06/16/24 1256 Place and maintain  sequential compression device Start: 06/15/24 1250 SCDs Start: 06/12/24 2231    Family Communication: None at bedside   Disposition Plan: Status is: Inpatient Remains inpatient appropriate because: Severity of illness   Planned Discharge Destination:Skilled nursing facility   Diet: Diet Order             DIET DYS 2 Room service appropriate? No; Fluid consistency: Nectar Thick  Diet effective now                     Antimicrobial agents: Anti-infectives (From admission, onward)    Start     Dose/Rate Route Frequency Ordered Stop   06/17/24 1400  Ampicillin -Sulbactam (UNASYN ) 3 g in sodium chloride  0.9 % 100 mL IVPB        3 g 200 mL/hr over 30 Minutes Intravenous Every 8 hours 06/17/24 1048     06/13/24 0645  piperacillin -tazobactam (ZOSYN ) IVPB 3.375 g  Status:  Discontinued       Placed in Followed by Linked Group   3.375 g 12.5 mL/hr over 240 Minutes Intravenous Every 8 hours 06/12/24 2243 06/17/24 1048   06/12/24 2245  piperacillin -tazobactam (ZOSYN ) IVPB 3.375 g       Placed in Followed by Linked Group   3.375 g 100 mL/hr over 30 Minutes Intravenous  Once 06/12/24 2243 06/12/24 2323   06/12/24 1530  cefTRIAXone  (ROCEPHIN ) 1 g in sodium chloride  0.9 % 100 mL IVPB        1 g 200 mL/hr over 30 Minutes Intravenous  Once 06/12/24 1519 06/12/24 1757        MEDICATIONS: Scheduled Meds:  atorvastatin   40 mg Oral QPM   Chlorhexidine  Gluconate Cloth  6 each Topical Daily   guaiFENesin   1,200 mg Oral BID   sodium chloride  flush  3 mL Intravenous Q12H   tamsulosin   0.4 mg Oral QHS   Continuous Infusions:  ampicillin -sulbactam (UNASYN ) IV 3 g (06/17/24 1424)   sodium chloride  irrigation     sodium chloride  irrigation     PRN Meds:.acetaminophen  **OR** acetaminophen , hydrALAZINE , ipratropium-albuterol , senna-docusate   I have personally reviewed following labs and imaging studies  LABORATORY DATA: CBC: Recent Labs  Lab 06/12/24 1517 06/13/24 0426  06/14/24 0613 06/15/24 0600 06/16/24 0244 06/17/24 0327  WBC 9.1 8.5 9.0 7.7 7.7 8.8  NEUTROABS 6.8  --   --   --   --   --   HGB 12.8* 11.7* 11.9* 12.0* 12.3* 12.1*  HCT 39.8 36.4* 36.7* 37.1* 37.8* 36.6*  MCV 91.1 91.2 90.4 89.2 90.0 89.3  PLT 214 205 199 209 208 187    Basic Metabolic Panel: Recent Labs  Lab 06/13/24 0426 06/14/24 0613 06/15/24 0600 06/16/24 0244 06/17/24 0327  NA 142 140 142 143 143  K 3.9 3.8 3.9 3.5 3.6  CL 110 107 106 107 108  CO2 24 22 20* 23 23  GLUCOSE 120* 106* 66* 136* 140*  BUN 22 18 13 13 17   CREATININE 0.72 0.75 0.63 0.69 0.58*  CALCIUM  8.5* 8.7* 8.2* 8.4* 8.4*    GFR: Estimated Creatinine Clearance: 65.4 mL/min (A) (by C-G formula based on SCr of 0.58 mg/dL (L)).  Liver Function Tests: Recent Labs  Lab 06/12/24 1517  AST 32  ALT 23  ALKPHOS 87  BILITOT 0.4  PROT 6.4*  ALBUMIN 3.0*   No results for input(s): LIPASE, AMYLASE in the last 168 hours. Recent Labs  Lab 06/12/24 1652  AMMONIA 65*  Coagulation Profile: Recent Labs  Lab 06/12/24 1517  INR 1.0    Cardiac Enzymes: No results for input(s): CKTOTAL, CKMB, CKMBINDEX, TROPONINI in the last 168 hours.  BNP (last 3 results) Recent Labs    06/13/24 0426  PROBNP 55.9    Lipid Profile: No results for input(s): CHOL, HDL, LDLCALC, TRIG, CHOLHDL, LDLDIRECT in the last 72 hours.  Thyroid  Function Tests: No results for input(s): TSH, T4TOTAL, FREET4, T3FREE, THYROIDAB in the last 72 hours.  Anemia Panel: No results for input(s): VITAMINB12, FOLATE, FERRITIN, TIBC, IRON, RETICCTPCT in the last 72 hours.  Urine analysis:    Component Value Date/Time   COLORURINE RED (A) 06/12/2024 1945   APPEARANCEUR (A) 06/12/2024 1945    TEST NOT REPORTED DUE TO COLOR INTERFERENCE OF URINE PIGMENT   LABSPEC  06/12/2024 1945    TEST NOT REPORTED DUE TO COLOR INTERFERENCE OF URINE PIGMENT   PHURINE  06/12/2024 1945    TEST NOT  REPORTED DUE TO COLOR INTERFERENCE OF URINE PIGMENT   GLUCOSEU (A) 06/12/2024 1945    TEST NOT REPORTED DUE TO COLOR INTERFERENCE OF URINE PIGMENT   HGBUR (A) 06/12/2024 1945    TEST NOT REPORTED DUE TO COLOR INTERFERENCE OF URINE PIGMENT   BILIRUBINUR (A) 06/12/2024 1945    TEST NOT REPORTED DUE TO COLOR INTERFERENCE OF URINE PIGMENT   KETONESUR (A) 06/12/2024 1945    TEST NOT REPORTED DUE TO COLOR INTERFERENCE OF URINE PIGMENT   PROTEINUR (A) 06/12/2024 1945    TEST NOT REPORTED DUE TO COLOR INTERFERENCE OF URINE PIGMENT   UROBILINOGEN 1.0 09/22/2014 0726   NITRITE (A) 06/12/2024 1945    TEST NOT REPORTED DUE TO COLOR INTERFERENCE OF URINE PIGMENT   LEUKOCYTESUR (A) 06/12/2024 1945    TEST NOT REPORTED DUE TO COLOR INTERFERENCE OF URINE PIGMENT    Sepsis Labs: Lactic Acid, Venous    Component Value Date/Time   LATICACIDVEN 0.9 06/12/2024 1641    MICROBIOLOGY: Recent Results (from the past 240 hours)  Blood Culture (routine x 2)     Status: None   Collection Time: 06/12/24  6:27 PM   Specimen: BLOOD LEFT HAND  Result Value Ref Range Status   Specimen Description BLOOD LEFT HAND  Final   Special Requests   Final    BOTTLES DRAWN AEROBIC AND ANAEROBIC Blood Culture results may not be optimal due to an inadequate volume of blood received in culture bottles   Culture   Final    NO GROWTH 5 DAYS Performed at [facility], [address], [place], [place] [address]    Report Status 06/17/2024 FINAL  Final  Urine Culture     Status: Abnormal   Collection Time: 06/12/24  7:45 PM   Specimen: Urine, Random  Result Value Ref Range Status   Specimen Description URINE, RANDOM  Final   Special Requests   Final    NONE Reflexed from [id number] Performed at [facility], [address] [place], [place] [address]    Culture (A)  Final    >=100,000 COLONIES/mL ENTEROCOCCUS FAECALIS 20,000 COLONIES/mL PROTEUS MIRABILIS    Report Status 06/16/2024 FINAL  Final   Organism ID,  Bacteria ENTEROCOCCUS FAECALIS (A)  Final   Organism ID, Bacteria PROTEUS MIRABILIS (A)  Final      Susceptibility   Enterococcus faecalis - MIC*    AMPICILLIN  <=2 SENSITIVE Sensitive     NITROFURANTOIN  <=16 SENSITIVE Sensitive     VANCOMYCIN  1 SENSITIVE Sensitive     * >=  100,000 COLONIES/mL ENTEROCOCCUS FAECALIS   Proteus mirabilis - MIC*    AMPICILLIN  8 SENSITIVE Sensitive     CEFAZOLIN  (URINE) Value in next row Sensitive      4 SENSITIVEThis is a modified FDA-approved test that has been validated and its performance characteristics determined by the reporting laboratory.  This laboratory is certified under the Clinical Laboratory Improvement Amendments CLIA as qualified to perform high complexity clinical laboratory testing.    CEFEPIME  Value in next row Sensitive      4 SENSITIVEThis is a modified FDA-approved test that has been validated and its performance characteristics determined by the reporting laboratory.  This laboratory is certified under the Clinical Laboratory Improvement Amendments CLIA as qualified to perform high complexity clinical laboratory testing.    ERTAPENEM Value in next row Sensitive      4 SENSITIVEThis is a modified FDA-approved test that has been validated and its performance characteristics determined by the reporting laboratory.  This laboratory is certified under the Clinical Laboratory Improvement Amendments CLIA as qualified to perform high complexity clinical laboratory testing.    CEFTRIAXONE  Value in next row Sensitive      4 SENSITIVEThis is a modified FDA-approved test that has been validated and its performance characteristics determined by the reporting laboratory.  This laboratory is certified under the Clinical Laboratory Improvement Amendments CLIA as qualified to perform high complexity clinical laboratory testing.    CIPROFLOXACIN  Value in next row Resistant      4 SENSITIVEThis is a modified FDA-approved test that has been validated and its  performance characteristics determined by the reporting laboratory.  This laboratory is certified under the Clinical Laboratory Improvement Amendments CLIA as qualified to perform high complexity clinical laboratory testing.    GENTAMICIN Value in next row Sensitive      4 SENSITIVEThis is a modified FDA-approved test that has been validated and its performance characteristics determined by the reporting laboratory.  This laboratory is certified under the Clinical Laboratory Improvement Amendments CLIA as qualified to perform high complexity clinical laboratory testing.    NITROFURANTOIN  Value in next row Resistant      4 SENSITIVEThis is a modified FDA-approved test that has been validated and its performance characteristics determined by the reporting laboratory.  This laboratory is certified under the Clinical Laboratory Improvement Amendments CLIA as qualified to perform high complexity clinical laboratory testing.    TRIMETH /SULFA  Value in next row Sensitive      4 SENSITIVEThis is a modified FDA-approved test that has been validated and its performance characteristics determined by the reporting laboratory.  This laboratory is certified under the Clinical Laboratory Improvement Amendments CLIA as qualified to perform high complexity clinical laboratory testing.    AMPICILLIN /SULBACTAM Value in next row Sensitive      4 SENSITIVEThis is a modified FDA-approved test that has been validated and its performance characteristics determined by the reporting laboratory.  This laboratory is certified under the Clinical Laboratory Improvement Amendments CLIA as qualified to perform high complexity clinical laboratory testing.    PIP/TAZO Value in next row Sensitive      <=4 SENSITIVEThis is a modified FDA-approved test that has been validated and its performance characteristics determined by the reporting laboratory.  This laboratory is certified under the Clinical Laboratory Improvement Amendments CLIA as  qualified to perform high complexity clinical laboratory testing.    MEROPENEM Value in next row Sensitive      <=4 SENSITIVEThis is a modified FDA-approved test that has been validated and its performance characteristics  determined by the reporting laboratory.  This laboratory is certified under the Clinical Laboratory Improvement Amendments CLIA as qualified to perform high complexity clinical laboratory testing.    * 20,000 COLONIES/mL PROTEUS MIRABILIS  Blood Culture (routine x 2)     Status: None   Collection Time: 06/12/24  8:54 PM   Specimen: BLOOD RIGHT HAND  Result Value Ref Range Status   Specimen Description BLOOD RIGHT HAND  Final   Special Requests   Final    BOTTLES DRAWN AEROBIC AND ANAEROBIC Blood Culture adequate volume   Culture   Final    NO GROWTH 5 DAYS Performed at [facility], [address], [place], [place] [address]    Report Status 06/17/2024 FINAL  Final    RADIOLOGY STUDIES/RESULTS: No results found.   LOS: 5 days   Donalda Applebaum, MD  [facility]    To contact the attending provider between 7A-7P or the covering provider during after hours 7P-7A, please log into the web site [email or web address] and access using universal [employer] password for that web site. If you do not have the password, please call the hospital operator.  06/17/2024, 5:44 PM    "

## 2024-06-17 NOTE — Progress Notes (Signed)
 RT NOTE: RT attempted CPT via vest with PT. After around 3 minutes PT began yelling and saying stop. PT has twisted left thumb and his hands are contorted up and he says it is unbearable when chest vest running as it vibrates and moves his hands. Vitals are stable. RT will continue to monitor.

## 2024-06-17 NOTE — Plan of Care (Signed)

## 2024-06-17 NOTE — Progress Notes (Signed)
" ° °  °  Subjective: Patient up in bed in good spirits.  Deep rhonchorous cough without the ability to clear his secretions.  CBI had run dry again on rounds.  Objective: Vital signs in last 24 hours: Temp:  [97.6 F (36.4 C)-98.6 F (37 C)] 98.4 F (36.9 C) (01/27 1153) Pulse Rate:  [86-98] 95 (01/27 1153) Resp:  [19-20] 19 (01/27 0754) BP: (136-163)/(60-89) 154/84 (01/27 1153) SpO2:  [91 %-95 %] 93 % (01/27 1153) Weight:  [74.6 kg] 74.6 kg (01/27 0500)  Assessment/Plan: #gross hematuria #bilateral ureteral stents #bladder stone #UTI  Bilateral ureteral stent placement with Dr. Elisabeth on 05/23/2024.  Will still need definitive stone management after he clears infection.  Three-way Foley catheter placed in the emergency department without hematuria.  Hematuria returned and was started 06/16/2024.  CBI had run dry again on rounds today.  Hand irrigated and clamped.  Light tan urine with some old clot material in tubing.  No signs of active bleeding.  Hgb stable.  Culture again positive for resistant Proteus and Enterococcus.  Transitioned to Unasyn .  Reevaluate voiding trial in the morning.  Okay for discharge from a urologic perspective if he passes voiding trial.  Intake/Output from previous day: 01/26 0701 - 01/27 0700 In: 720 [P.O.:720] Out: 6350 [Urine:6350]  Intake/Output this shift: No intake/output data recorded.  Physical Exam:  General: Alert and oriented CV: No cyanosis Lungs: equal chest rise Gu: Three-way Foley catheter in place with CBI off  Lab Results: Recent Labs    06/15/24 0600 06/16/24 0244 06/17/24 0327  HGB 12.0* 12.3* 12.1*  HCT 37.1* 37.8* 36.6*   BMET Recent Labs    06/16/24 0244 06/17/24 0327  NA 143 143  K 3.5 3.6  CL 107 108  CO2 23 23  GLUCOSE 136* 140*  BUN 13 17  CREATININE 0.69 0.58*  CALCIUM  8.4* 8.4*  HGB 12.3* 12.1*  WBC 7.7 8.8     Studies/Results: No results found.     LOS: 5 days   Ole Bourdon,  NP [facility] Pager: [phone]  06/17/2024, 1:42 PM  "

## 2024-06-18 LAB — BASIC METABOLIC PANEL WITH GFR
Anion gap: 10 (ref 5–15)
BUN: 16 mg/dL (ref 8–23)
CO2: 24 mmol/L (ref 22–32)
Calcium: 8.6 mg/dL — ABNORMAL LOW (ref 8.9–10.3)
Chloride: 106 mmol/L (ref 98–111)
Creatinine, Ser: 0.6 mg/dL — ABNORMAL LOW (ref 0.61–1.24)
GFR, Estimated: >60 mL/min
Glucose, Bld: 118 mg/dL — ABNORMAL HIGH (ref 70–99)
Potassium: 3.8 mmol/L (ref 3.5–5.1)
Sodium: 140 mmol/L (ref 135–145)

## 2024-06-18 LAB — GLUCOSE, CAPILLARY
Glucose-Capillary: 109 mg/dL — ABNORMAL HIGH (ref 70–99)
Glucose-Capillary: 129 mg/dL — ABNORMAL HIGH (ref 70–99)
Glucose-Capillary: 173 mg/dL — ABNORMAL HIGH (ref 70–99)
Glucose-Capillary: 150 mg/dL — ABNORMAL HIGH (ref 70–99)

## 2024-06-18 LAB — CBC
HCT: 36.6 % — ABNORMAL LOW (ref 39.0–52.0)
Hemoglobin: 12 g/dL — ABNORMAL LOW (ref 13.0–17.0)
MCH: 29.3 pg (ref 26.0–34.0)
MCHC: 32.8 g/dL (ref 30.0–36.0)
MCV: 89.3 fL (ref 80.0–100.0)
Platelets: 181 10*3/uL (ref 150–400)
RBC: 4.1 MIL/uL — ABNORMAL LOW (ref 4.22–5.81)
RDW: 13.6 % (ref 11.5–15.5)
WBC: 8.3 10*3/uL (ref 4.0–10.5)
nRBC: 0 % (ref 0.0–0.2)

## 2024-06-18 NOTE — TOC Progression Note (Signed)
 Transition of Care [facility]) - Progression Note    Patient Details  Name: RAMARI BRAY MRN: [id number] Date of Birth: 1940/07/03  [facility]) CM/SW Contact  Inocente GORMAN Kindle, LCSW Phone Number: 06/18/2024, 2:12 PM  Clinical Narrative:    CSW updated [name] on potential discharge in the next day or two.   Expected Discharge Plan: Long Term Nursing Home Barriers to Discharge: Continued Medical Work up               Expected Discharge Plan and Services In-house Referral: Clinical Social Work   Post Acute Care Choice: Skilled Nursing Facility Living arrangements for the past 2 months: Skilled Nursing Facility                                       Social Drivers of Health (SDOH) Interventions SDOH Screenings   Food Insecurity: Patient Unable To Answer (06/13/2024)  Housing: Patient Unable To Answer (06/13/2024)  Transportation Needs: Patient Unable To Answer (06/13/2024)  Utilities: Patient Unable To Answer (06/13/2024)  Social Connections: Patient Unable To Answer (06/13/2024)  Recent Concern: Social Connections - Socially Isolated (05/31/2024)  Tobacco Use: Medium Risk (06/12/2024)    Readmission Risk Interventions    02/15/2022   12:13 PM 10/13/2021   12:30 PM  Readmission Risk Prevention Plan  Transportation Screening Complete Complete  Medication Review [facility]) Complete Referral to Pharmacy  PCP or Specialist appointment within 3-5 days of discharge Complete Not Complete  PCP/Specialist Appt Not Complete comments  Still inpatient without discharge date at this time  HRI or Home Care Consult Complete Not Complete  HRI or Home Care Consult Pt Refusal Comments  Patient is LTC at SNF  SW Recovery Care/Counseling Consult Complete Complete  Palliative Care Screening Complete Not Applicable  Skilled Nursing Facility Complete Complete

## 2024-06-18 NOTE — Progress Notes (Signed)
" ° °  °  Subjective: Patient up in bed in good spirits.  Deep rhonchorous cough without the ability to clear his secretions- unchanged from yesterday. Clear urine, CBI clamped overnight.  Objective: Vital signs in last 24 hours: Temp:  [97.3 F (36.3 C)-98.2 F (36.8 C)] 98.1 F (36.7 C) (01/28 1200) Pulse Rate:  [80-96] 89 (01/28 1200) Resp:  [18-25] 18 (01/28 1200) BP: (127-154)/(63-81) 142/81 (01/28 1200) SpO2:  [92 %-95 %] 95 % (01/28 1200) Weight:  [73.9 kg] 73.9 kg (01/28 0500)  Assessment/Plan: #gross hematuria #bilateral ureteral stents #bladder stone #UTI  Bilateral ureteral stent placement with Dr. Elisabeth on 05/23/2024.  Will still need definitive stone management after he clears infection.  Three-way Foley catheter placed in the emergency department without hematuria.  Hematuria returned and was started 06/16/2024.  CBI had run dry again on rounds today.  Hand irrigated and clamped.  Light tan urine with some old clot material in tubing.  No signs of active bleeding.  Hgb stable.  Culture again positive for resistant Proteus and Enterococcus.  Transitioned to Unasyn .  Foley removed today.   Okay for discharge from a urologic perspective if he passes voiding trial.  Intake/Output from previous day: 01/27 0701 - 01/28 0700 In: -  Out: 1600 [Urine:1600]  Intake/Output this shift: Total I/O In: 240 [P.O.:240] Out: 1000 [Urine:1000]  Physical Exam:  General: Alert and oriented CV: No cyanosis Lungs: equal chest rise Gu: Three-way Foley catheter in place with CBI off.  Clear yellow drainage  Lab Results: Recent Labs    06/16/24 0244 06/17/24 0327 06/18/24 0313  HGB 12.3* 12.1* 12.0*  HCT 37.8* 36.6* 36.6*   BMET Recent Labs    06/17/24 0327 06/18/24 0313  NA 143 140  K 3.6 3.8  CL 108 106  CO2 23 24  GLUCOSE 140* 118*  BUN 17 16  CREATININE 0.58* 0.60*  CALCIUM  8.4* 8.6*  HGB 12.1* 12.0*  WBC 8.8 8.3     Studies/Results: No results  found.     LOS: 6 days   Ole Bourdon, NP [facility] Pager: [phone]  06/18/2024, 3:30 PM  "

## 2024-06-18 NOTE — Progress Notes (Signed)
 Speech Language Pathology Treatment: Dysphagia  Patient Details Name: Gregory Kerr MRN: [id number] DOB: 1940/07/11 Today's Date: 06/18/2024 Time: 8477-8459 SLP Time Calculation (min) (ACUTE ONLY): 18 min  Assessment / Plan / Recommendation Clinical Impression  Plan: continue Dys 2, nectar thick liquids. Patient can use clear plastic double handled mug (in room) with straw independently. SLP will continue to follow.  Patient seen by SLP for skilled treatment focused on dysphagia goals. He requested ice water  but was understanding about need to thicken to nectar thick consistency. Holding styrofoam cup is difficult for patient but he was able to manage double handled cup with straw without difficulty after setup assistance. No overt s/s aspiration heard but patient continues to exhibit audible secretions that he is unable to clear.     HPI HPI: Pt is an 84 yo male presenting 1/22 with AMS. Admitted with acute encephalopathy (CT Head negative), UTI, and acute hypoxic respiratory failure. CXR without focal consolidation but did reveal large hiatal hernia. SLP consulted for swallow eval due to pt coughing with sips of liquids per nursing report. Multiple clinical swallowing evals completed during past hospitalizations recommending regular diet/thin liquids. PMH includes: CVA with L hemiparesis, GERD, PNA, and recent admission for influenza, aspiration, UTI. CT Cervical Spine in 2024 showed multilevel degenerative changes.      SLP Plan  Continue with current plan of care        Swallow Evaluation Recommendations         Recommendations  Diet recommendations: Dysphagia 2 (fine chop);Nectar-thick liquid Liquids provided via: Cup;Straw Medication Administration: Crushed with puree Supervision: Patient able to self feed;Staff to assist with self feeding;Full supervision/cueing for compensatory strategies Compensations: Slow rate;Small sips/bites Postural Changes and/or Swallow Maneuvers:  Seated upright 90 degrees                  Oral care BID;Staff/trained caregiver to provide oral care   Set up Supervision/Assistance Dysphagia, unspecified (R13.10)     Continue with current plan of care     Norleen IVAR Blase, MA, CCC-SLP Speech Therapy   06/18/2024, 4:02 PM

## 2024-06-19 LAB — GLUCOSE, CAPILLARY
Glucose-Capillary: 112 mg/dL — ABNORMAL HIGH (ref 70–99)
Glucose-Capillary: 107 mg/dL — ABNORMAL HIGH (ref 70–99)

## 2024-06-19 MED ORDER — LORAZEPAM 0.5 MG PO TABS: 0.5000 mg | ORAL_TABLET | Freq: Every day | ORAL | 0 refills | Status: AC | PRN

## 2024-06-19 MED ORDER — AMOXICILLIN 500 MG PO CAPS: 500.0000 mg | ORAL_CAPSULE | Freq: Three times a day (TID) | ORAL | Status: AC

## 2024-06-19 NOTE — Progress Notes (Signed)
" ° °  °  Subjective: Patient up in bed in good spirits.  Deep rhonchorous cough without the ability to clear his secretions.  CBI had run dry again on rounds.  Objective: Vital signs in last 24 hours: Temp:  [97.6 F (36.4 C)-98.1 F (36.7 C)] 97.7 F (36.5 C) (01/29 0758) Pulse Rate:  [80-101] 86 (01/29 0758) Resp:  [13-22] 13 (01/29 0758) BP: (140-157)/(79-103) 140/88 (01/29 0758) SpO2:  [95 %] 95 % (01/29 0758) Weight:  [74.3 kg] 74.3 kg (01/29 0500)  Assessment/Plan: #gross hematuria #bilateral ureteral stents #bladder stone #UTI  Bilateral ureteral stent placement with Dr. Elisabeth on 05/23/2024.  Will still need definitive stone management after he clears infection.  Some minor bleeding while stents remain in place, particularly while receiving clopidogrel .  Please hold this if medically reasonable  Hematuria clear yesterday with voiding trial.  Every 2 bladder scans ordered.  Have not been collected as of this morning.  PureWick placed with dark tea colored urine.  Appears to be representative of residual old blood.  No sign of active bleeding.  Hgb stable for several days.  Labs not available for this morning.  Culture again positive for resistant Proteus and Enterococcus.  Transitioned to Unasyn .  Okay for discharge from a urologic perspective.  Intake/Output from previous day: 01/28 0701 - 01/29 0700 In: 240 [P.O.:240] Out: 1000 [Urine:1000]  Intake/Output this shift: Total I/O In: 300 [P.O.:300] Out: 600 [Urine:600]  Physical Exam:  General: Alert and oriented CV: No cyanosis Lungs: equal chest rise Gu: Three-way Foley catheter removed.  PureWick in place with tea colored urine.  Lab Results: Recent Labs    06/17/24 0327 06/18/24 0313  HGB 12.1* 12.0*  HCT 36.6* 36.6*   BMET Recent Labs    06/17/24 0327 06/18/24 0313  NA 143 140  K 3.6 3.8  CL 108 106  CO2 23 24  GLUCOSE 140* 118*  BUN 17 16  CREATININE 0.58* 0.60*  CALCIUM  8.4* 8.6*  HGB 12.1*  12.0*  WBC 8.8 8.3     Studies/Results: No results found.     LOS: 7 days   Ole Bourdon, NP [facility] Pager: [phone]  06/19/2024, 11:29 AM  "

## 2024-06-19 NOTE — Plan of Care (Signed)
  Problem: Education: Goal: Knowledge of General Education information will improve Description: Including pain rating scale, medication(s)/side effects and non-pharmacologic comfort measures Outcome: Progressing   Problem: Clinical Measurements: Goal: Will remain free from infection Outcome: Progressing Goal: Respiratory complications will improve Outcome: Progressing   Problem: Nutrition: Goal: Adequate nutrition will be maintained Outcome: Progressing

## 2024-06-19 NOTE — Progress Notes (Signed)
 AVS completed for discharge packet and placed with chart.

## 2024-06-19 NOTE — Discharge Summary (Addendum)
 "  PATIENT DETAILS Name: Gregory Kerr Age: 84 y.o. Sex: male Date of Birth: [birth year] MRN: [id number]. Admitting Physician: Evalene GORMAN Sprinkles, MD [name], [name] Date: 06/12/2024 Discharge date: 06/19/2024  Recommendations for Outpatient Follow-up:  Follow up with PCP in 1-2 weeks Please obtain CMP/CBC in one week Please ensure follow-up with urology for ureteral stent removal/definitive treatment of bladder stone  Admitted From:  SNF  Disposition: Skilled nursing facility   Discharge Condition: good  CODE STATUS:   Code Status: Limited: Do not attempt resuscitation (DNR) -DNR-LIMITED -Do Not Intubate/DNI    Diet recommendation:  Diet Order             DIET DYS 2 Room service appropriate? No; Fluid consistency: Nectar Thick  Diet effective now                    Brief Summary: Patient is a 84 y.o.  male with history of CVA s/p left hemiparesis, recent hospitalization for B/L ureteral stent-bacteremia-presented with altered mental status/hematuria   Significant events: 1/1-1/7>> hospitalization-sepsis secondary to UTI-has B/L hydronephrosis-large bladder stone-s/p B/L stent placement. 1/9-1/16>> hospitalization-sepsis secondary to influenza-Proteus/Enterococcus UTI 1/22>> admit to [facility]/hematuria   Significant studies: 1/22>> CXR: No PNA 1/22>> CT head: No acute intracranial abnormality 1/23>> CT renal stone study: B/L ureteral stents in adequate position.   Significant microbiology data: 1/02>> blood culture: Proteus/Pseudomonas 1/02>> urine culture: Proteus/Enterococcus 1/09>> blood culture: No growth 1/22>> urine culture: Enterococcus/Proteus 1/22>> blood culture: No growth   Procedures: 1/02>> cystoscopy with B/L ureteral stent placement   Consults: Urology  Brief Hospital Course: Acute metabolic encephalopathy Secondary to complicated UTI Improved with treatment of underlying etiology-suspect close to baseline.   Complicated  UTI-hemorrhagic cystitis Managed with IV antibiotics (initially Zosyn  and then Unasyn ) and three-way Foley catheter with continuous bladder irrigation Hematuria has resolved Foley catheter removed on 1/28-voiding spontaneously Discussed with infectious disease MD- Dr Adelfa reviewed-recommendations are to continue oral antimicrobial therapy with amoxicillin  for at least 2-3 weeks-given recurrent nature and concerned that the stent may be infected. Needs follow-up with urology-for definite management of bladder stone and stent removal soon-as soon as he recovers from this acute illness-hopefully in the next 1-2 weeks.   Recent history of bladder stone-pseudomonal bacteremia-Proteus/enterococcal UTI-s/p B/L ureteral stent placement-1/02 Urology following closely. Plans are for definite stone management in the outpatient setting when UTI symptoms have resolved.  Please ensure outpatient follow-up with [facility] for definite management of bladder stone-and removal of ureteral stone-see above.   History of CVA with left-sided hemiparesis Plavix  initially held due to hemorrhagic cystitis-resume on discharge. Lipitor   Acute hypoxic respiratory failure secondary to atelectasis/aspiration pneumonitis Overall much improved-treated with Unasyn . SLP following-seems to be tolerating dysphagia to diet with nectar thick liquids Will be transitioned to oral amoxicillin  on discharge.   Normocytic anemia Mild Likely secondary to critical illness Follow CBC periodically.   Pressure Ulcer: Agree with assessment as outlined below. Wound 06/13/24 0535 Pressure Injury Coccyx Posterior Stage 1 -  Intact skin with non-blanchable redness of a localized area usually over a bony prominence. (Active)   Note-Cousin [name] [phone] called-left VM 1/29  Discharge Diagnoses:  Principal Problem:   Acute encephalopathy Active Problems:   Urolithiasis   History of CVA with residual  deficit   Gross hematuria   Discharge Instructions:  Activity:  As tolerated with Full fall precautions use walker/cane & assistance as needed  Discharge Instructions     Call MD for:  extreme fatigue   Complete by: As directed    Call MD for:  persistant dizziness or light-headedness   Complete by: As directed    Discharge instructions   Complete by: As directed    Follow with Primary MD  Place, [name] in 1-2 weeks  Please follow-up with [facility] in the next 1-2 weeks-for definite management of your bladder stone and ureteral stent removal.  Please get a complete blood count and chemistry panel checked by your Primary MD at your next visit, and again as instructed by your Primary MD.  Get Medicines reviewed and adjusted: Please take all your medications with you for your next visit with your Primary MD  Laboratory/radiological data: Please request your Primary MD to go over all hospital tests and procedure/radiological results at the follow up, please ask your Primary MD to get all Hospital records sent to his/her office.  In some cases, they will be blood work, cultures and biopsy results pending at the time of your discharge. Please request that your primary care M.D. follows up on these results.  Also Note the following: If you experience worsening of your admission symptoms, develop shortness of breath, life threatening emergency, suicidal or homicidal thoughts you must seek medical attention immediately by calling 911 or calling your MD immediately  if symptoms less severe.  You must read complete instructions/literature along with all the possible adverse reactions/side effects for all the Medicines you take and that have been prescribed to you. Take any new Medicines after you have completely understood and accpet all the possible adverse reactions/side effects.   Do not drive when taking Pain medications or sleeping medications (Benzodaizepines)  Do not take  more than prescribed Pain, Sleep and Anxiety Medications. It is not advisable to combine anxiety,sleep and pain medications without talking with your primary care practitioner  Special Instructions: If you have smoked or chewed Tobacco  in the last 2 yrs please stop smoking, stop any regular Alcohol   and or any Recreational drug use.  Wear Seat belts while driving.  Please note: You were cared for by a hospitalist during your hospital stay. Once you are discharged, your primary care physician will handle any further medical issues. Please note that NO REFILLS for any discharge medications will be authorized once you are discharged, as it is imperative that you return to your primary care physician (or establish a relationship with a primary care physician if you do not have one) for your post hospital discharge needs so that they can reassess your need for medications and monitor your lab values.   Increase activity slowly   Complete by: As directed    No wound care   Complete by: As directed       Allergies as of 06/19/2024   No Active Allergies      Medication List     STOP taking these medications    HYDROcodone -acetaminophen  5-325 MG tablet Commonly known as: NORCO/VICODIN   trimethoprim  100 MG tablet Commonly known as: TRIMPEX        TAKE these medications    acetaminophen  650 MG CR tablet Commonly known as: TYLENOL  Take 650 mg by mouth in the morning and at bedtime.   amoxicillin  500 MG capsule Commonly known as: AMOXIL  Take 1 capsule (500 mg total) by mouth 3 (three) times daily for 10 days.   atorvastatin  40 MG tablet Commonly known as: LIPITOR Take 1 tablet (40 mg total) by mouth every evening. What changed: when to take this  baclofen  20 MG tablet Commonly known as: LIORESAL  Take 1 tablet (20 mg total) by mouth 3 (three) times daily.   bisacodyl  10 MG suppository Commonly known as: DULCOLAX Place 10 mg rectally daily as needed for moderate  constipation.   calcium -vitamin D  500-5 MG-MCG tablet Commonly known as: OSCAL WITH D Take 1 tablet by mouth 2 (two) times daily.   clopidogrel  75 MG tablet Commonly known as: PLAVIX  Take 1 tablet (75 mg total) by mouth daily.   clotrimazole-betamethasone cream Commonly known as: LOTRISONE Apply 1 Application topically daily.   Cranberry 450 MG Tabs Take 450 mg by mouth daily.   Dextromethorphan -quiNIDine 20-10 MG capsule Commonly known as: NUEDEXTA  Take 1 capsule by mouth at bedtime. For Pseudobulbar Affect (PBA)   gabapentin  100 MG capsule Commonly known as: NEURONTIN  Take 200 mg by mouth 3 (three) times daily.   gentamicin ointment 0.1 % Commonly known as: GARAMYCIN Apply 1 Application topically daily.   ipratropium-albuterol  0.5-2.5 (3) MG/3ML Soln Commonly known as: DUONEB Inhale 3 mLs into the lungs in the morning, at noon, and at bedtime.   loperamide  2 MG tablet Commonly known as: IMODIUM  A-D Take 4 mg by mouth 4 (four) times daily as needed for diarrhea or loose stools.   loratadine  10 MG tablet Commonly known as: CLARITIN  Take 10 mg by mouth at bedtime.   LORazepam  0.5 MG tablet Commonly known as: ATIVAN  Take 1 tablet (0.5 mg total) by mouth daily as needed for anxiety.   nitroGLYCERIN  0.4 MG SL tablet Commonly known as: NITROSTAT  Place 0.4 mg under the tongue every 5 (five) minutes as needed for chest pain.   pantoprazole  20 MG tablet Commonly known as: PROTONIX  Take 20 mg by mouth daily.   phenazopyridine  100 MG tablet Commonly known as: PYRIDIUM  Take 100 mg by mouth every 8 (eight) hours as needed (bladder spasms).   polyethylene glycol 17 g packet Commonly known as: MIRALAX  / GLYCOLAX  Take 17 g by mouth daily as needed (constipation).   PreviDent 5000 Booster Plus 1.1 % Pste Generic drug: Sodium Fluoride Take 1 Application by mouth at bedtime.   Pro-Stat AWC Liqd Take 30 mLs by mouth 2 (two) times daily with a meal.   saccharomyces  boulardii 250 MG capsule Commonly known as: FLORASTOR Take 250 mg by mouth every 12 (twelve) hours.   sertraline  100 MG tablet Commonly known as: ZOLOFT  Take 100 mg by mouth daily. Take with 25mg  Sertraline  tablet for a combined dose 125mg .   sertraline  25 MG tablet Commonly known as: ZOLOFT  Take 25 mg by mouth daily. Take with 100mg  Sertraline  tablet for a combined dose 125mg .   tamsulosin  0.4 MG Caps capsule Commonly known as: FLOMAX  Take 1 capsule (0.4 mg total) by mouth at bedtime.   WesTab Plus 27-1 MG Tabs Take 1 tablet by mouth at bedtime.        Follow-up Information     Place, [name]. Schedule an appointment as soon as possible for a visit in 1 week(s).   Specialty: Skilled Nursing Facility Contact information: [address] [place] [address] [phone]         Elisabeth Valli BIRCH, MD. Schedule an appointment as soon as possible for a visit in 1 week(s).   Specialty: Urology Contact information: [address] [place] [address] [phone]                Allergies[1]   Other Procedures/Studies: DG Swallowing Func-Speech Pathology Result Date: 06/15/2024 Table formatting from  the original result was not included. Modified Barium Swallow Study Patient Details Name: METRO EDENFIELD MRN: [id number] Date of Birth: March 02, 1941 Today's Date: 06/15/2024 HPI/PMH: HPI: Pt is an 84 yo male presenting 1/22 with AMS. Admitted with acute encephalopathy (CT Head negative), UTI, and acute hypoxic respiratory failure. CXR without focal consolidation but did reveal large hiatal hernia. SLP consulted for swallow eval due to pt coughing with sips of liquids per nursing report. Multiple clinical swallowing evals completed during past hospitalizations recommending regular diet/thin liquids. PMH includes: CVA with L hemiparesis, GERD, PNA, and recent admission for influenza, aspiration, UTI. CT Cervical Spine in 2024 showed multilevel degenerative changes.  Clinical Impression: SLP recommending PO diet of dys 2 (minced) solids, nectar thick liquids and will follow for toleration and ability to advance. Patient presents with a moderate oropharyngeal dysphagia as per this MBS. He exhibited silent aspiration of thin liquids which was worsened with straw sips or cup sips. Penetration above the vocal cords but no aspiration observed with nectar thick liquids. No penetration observed with honey thick liquids, mechanical soft solids, puree solids. Patient with delayed mastication, decreased efficiency with solids. Factors that may increase risk of adverse event in presence of aspiration Noe & Lianne 2021): Dependence for feeding and/or oral hygiene;Frail or deconditioned;Limited mobility;Reduced cognitive function DIGEST Swallow Severity Rating*  Safety: 2  Efficiency:1  Overall Pharyngeal Swallow Severity: 2 1: mild; 2: moderate; 3: severe; 4: profound *The Dynamic Imaging Grade of Swallowing Toxicity is standardized for the head and neck cancer population, however, demonstrates promising clinical applications across populations to standardize the clinical rating of pharyngeal swallow safety and severity. Recommendations/Plan: Swallowing Evaluation Recommendations Swallowing Evaluation Recommendations Recommendations: PO diet PO Diet Recommendation: Dysphagia 2 (Finely chopped); Mildly thick liquids (Level 2, nectar thick) Liquid Administration via: Cup; No straw Medication Administration: Crushed with puree Supervision: Full assist for feeding; Staff to assist with self-feeding Swallowing strategies  : Minimize environmental distractions; Slow rate; Small bites/sips Postural changes: Stay upright 30-60 min after meals; Position pt fully upright for meals Oral care recommendations: Oral care BID (2x/day) Caregiver Recommendations: Avoid jello, ice cream, thin soups, popsicles; Remove water  pitcher Treatment Plan Treatment Plan Treatment recommendations: Therapy as  outlined in treatment plan below Follow-up recommendations: Skilled nursing-short term rehab (<3 hours/day) Functional status assessment: Patient has had a recent decline in their functional status and demonstrates the ability to make significant improvements in function in a reasonable and predictable amount of time. Treatment frequency: Min 2x/week Treatment duration: 2 weeks Interventions: Aspiration precaution training; Diet toleration management by SLP; Compensatory techniques; Patient/family education; Trials of upgraded texture/liquids Recommendations Recommendations for follow up therapy are one component of a multi-disciplinary discharge planning process, led by the attending physician.  Recommendations may be updated based on patient status, additional functional criteria and insurance authorization. Assessment: Orofacial Exam: Orofacial Exam Oral Cavity: Oral Hygiene: WFL Oral Cavity - Dentition: Missing dentition Orofacial Anatomy: WFL Anatomy: Anatomy: Suspected cervical osteophytes Boluses Administered: Boluses Administered Boluses Administered: Thin liquids (Level 0); Mildly thick liquids (Level 2, nectar thick); Moderately thick liquids (Level 3, honey thick); Puree; Solid  Oral Impairment Domain: Oral Impairment Domain Lip Closure: No labial escape Tongue control during bolus hold: Posterior escape of greater than half of bolus Bolus preparation/mastication: Slow prolonged chewing/mashing with complete recollection Bolus transport/lingual motion: Slow tongue motion Oral residue: Complete oral clearance Location of oral residue : N/A Initiation of pharyngeal swallow : Valleculae; Posterior laryngeal surface of the epiglottis  Pharyngeal Impairment Domain: Pharyngeal Impairment  Domain Soft palate elevation: No bolus between soft palate (SP)/pharyngeal wall (PW) Laryngeal elevation: Complete superior movement of thyroid  cartilage with complete approximation of arytenoids to epiglottic petiole Anterior  hyoid excursion: Partial anterior movement Epiglottic movement: Complete inversion Laryngeal vestibule closure: Incomplete, narrow column air/contrast in laryngeal vestibule Pharyngeal stripping wave : Present - complete Pharyngeal contraction (A/P view only): N/A Pharyngoesophageal segment opening: Complete distension and complete duration, no obstruction of flow Tongue base retraction: Trace column of contrast or air between tongue base and PPW Pharyngeal residue: Collection of residue within or on pharyngeal structures Location of pharyngeal residue: Valleculae; Pharyngeal wall; Pyriform sinuses  Esophageal Impairment Domain: Esophageal Impairment Domain Esophageal clearance upright position: Complete clearance, esophageal coating Pill: No data recorded Penetration/Aspiration Scale Score: Penetration/Aspiration Scale Score 1.  Material does not enter airway: Moderately thick liquids (Level 3, honey thick); Puree; Solid 2.  Material enters airway, remains ABOVE vocal cords then ejected out: Mildly thick liquids (Level 2, nectar thick) 5.  Material enters airway, CONTACTS cords and not ejected out: Thin liquids (Level 0) 8.  Material enters airway, passes BELOW cords without attempt by patient to eject out (silent aspiration) : Thin liquids (Level 0) Compensatory Strategies: Compensatory Strategies Compensatory strategies: Yes Straw: Ineffective Ineffective Straw: Thin liquid (Level 0)   General Information: No data recorded Diet Prior to this Study: NPO   Temperature : Normal   Respiratory Status: WFL   Supplemental O2: None (Room air)   History of Recent Intubation: No  Behavior/Cognition: Alert; Cooperative; Requires cueing; Pleasant mood Self-Feeding Abilities: Dependent for feeding Baseline vocal quality/speech: Hypophonia/low volume Volitional Cough: Able to elicit Volitional Swallow: Able to elicit Exam Limitations: Other (comment) (no limitations but positioning of patient was difficult) Goal Planning:  Prognosis for improved oropharyngeal function: Good No data recorded No data recorded Patient/Family Stated Goal: nothing stated Consulted and agree with results and recommendations: Patient Pain: Pain Assessment Pain Assessment: Faces Faces Pain Scale: 2 Pain Location: sacrum and low back with movement Pain Descriptors / Indicators: Discomfort; Grimacing Pain Intervention(s): Monitored during session End of Session: Start Time:SLP Start Time (ACUTE ONLY): 1038 Stop Time: SLP Stop Time (ACUTE ONLY): 1100 Time Calculation:SLP Time Calculation (min) (ACUTE ONLY): 22 min Charges: No data recorded SLP visit diagnosis: SLP Visit Diagnosis: Dysphagia, unspecified (R13.10) Past Medical History: Past Medical History: Diagnosis Date  GERD (gastroesophageal reflux disease)   Hernia   large one; on my left stomach (09/22/2014)  Hiatal hernia 10/24/2012  HTN (hypertension) 10/24/2012  Hyperlipidemia   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  Osteoporosis, unspecified 10/24/2012  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  Pneumonia   once or twice (09/22/2014)  Retinal detachment   left eye  Stroke (HCC) 2007  left side doesn't work now (09/22/2014) Past Surgical History: Past Surgical History: Procedure Laterality Date  APPENDECTOMY  05/27/2009  Jeoffrey Dawn, MD  CATARACT EXTRACTION W/ INTRAOCULAR LENS  IMPLANT, BILATERAL Bilateral ?2014  CHOLECYSTECTOMY N/A 09/26/2014  Procedure: LAPAROSCOPIC CHOLECYSTECTOMY WITH INTRAOPERATIVE CHOLANGIOGRAM;  Surgeon: Donnice Lima, MD;  Location: [facility] OR;  Service: General;  Laterality: N/A;  COLONOSCOPY WITH PROPOFOL  N/A 12/29/2015  Procedure: COLONOSCOPY WITH PROPOFOL ;  Surgeon: Jerrell Sol, MD;  Location[facility] ENDOSCOPY;  Service: Endoscopy;  Laterality: N/A;  CYSTOSCOPY W/ URETERAL STENT PLACEMENT Bilateral 05/23/2024  Procedure: CYSTOSCOPY WITH LEFT RETROGRADE PYELOGRAM AND BILATARAL URETERAL STENT INSERTION;  Surgeon: Elisabeth Valli BIRCH, MD;  Location: [facility] OR;  Service: Urology;  Laterality: Bilateral;  ERCP  N/A 09/25/2014  Procedure: ENDOSCOPIC RETROGRADE CHOLANGIOPANCREATOGRAPHY (ERCP);  Surgeon: Oliva Boots, MD;  Location: [facility] ENDOSCOPY;  Service: Endoscopy;  Laterality: N/A;  IR CATHETER TUBE CHANGE  05/24/2021  PARS PLANA VITRECTOMY Left 01/30/2019  Procedure: PARS PLANA VITRECTOMY WITH 25 GAUGE;  Surgeon: Jarold Mayo, MD;  Location: [facility] OR;  Service: Ophthalmology;  Laterality: Left;  PARS PLANA VITRECTOMY Right 11/13/2019  Procedure: PARS PLANA VITRECTOMY WITH 25 GAUGE WITH REMOVAL OF POSTERIOR CAPSULAR OPACIFICATION WITH VITRECTOR;  Surgeon: Jarold Mayo, MD;  Location: [facility] OR;  Service: Ophthalmology;  Laterality: Right;  PHOTOCOAGULATION WITH LASER Left 01/30/2019  Procedure: PHOTOCOAGULATION WITH LASER;  Surgeon: Jarold Mayo, MD;  Location: [facility] OR;  Service: Ophthalmology;  Laterality: Left; Norleen IVAR Blase, MA, CCC-SLP Speech Therapy 06/15/2024, 1:44 PM  CT RENAL STONE STUDY Result Date: 06/13/2024 EXAM: CT ABDOMEN AND PELVIS WITHOUT CONTRAST 06/13/2024 02:12:36 AM TECHNIQUE: CT of the abdomen and pelvis was performed without the administration of intravenous contrast. Multiplanar reformatted images are provided for review. Automated exposure control, iterative reconstruction, and/or weight-based adjustment of the mA/kV was utilized to reduce the radiation dose to as low as reasonably achievable. COMPARISON: 05/30/2024 CLINICAL HISTORY: Urolithiasis, symptomatic; Acute urinary tract infection, gross hematuria, bilateral ureteral stents. FINDINGS: LOWER CHEST: Patchy right lower lobe opacity, atelectasis versus pneumonia. LIVER: The liver is unremarkable. GALLBLADDER AND BILE DUCTS: Status post cholecystectomy. No biliary ductal dilatation. SPLEEN: No acute abnormality. PANCREAS: No acute abnormality. ADRENAL GLANDS: No acute abnormality. KIDNEYS, URETERS AND BLADDER: Bilateral double pigtail ureteral stents in satisfactory position. Bilateral nonobstructing renal calculi, including a 13 mm calculus in the  right upper pole and a 15 mm calculus in the left renal collecting system. Layering 2.5 cm bladder calculus. No hydronephrosis. No perinephric or periureteral stranding. GI AND BOWEL: Large hiatal hernia. Stomach demonstrates no acute abnormality. There is no bowel obstruction. Moderate fat containing left inguinal hernia. Large right inguinal/scrotal hernia containing a loop of nondilated small bowel. PERITONEUM AND RETROPERITONEUM: No ascites. No free air. VASCULATURE: Aorta is normal in caliber. LYMPH NODES: No lymphadenopathy. REPRODUCTIVE ORGANS: Prostate is notable for dystrophic calcifications. BONES AND SOFT TISSUES: Moderate superior endplate crushing fracture deformity at L3 chronic, with an underlying lytic lesion. Additional lytic lesion at L2 (sagittal image 60). No focal soft tissue abnormality. IMPRESSION: 1. Bilateral double pigtail ureteral stents in appropriate position. 2. Bilateral nonobstructing renal calculi, including a 13 mm calculus in the right upper pole and a 15 mm calculus in the left renal collecting system. Layering 2.5 cm bladder calculus. 3. Patchy right lower lobe opacity, atelectasis versus pneumonia. 4. Lytic lesions at L2 and L3 with chronic L3 compression fracture. This appearance raises concern for myeloma vs lytic metastases. 5. Additional ancillary findings, as above. Electronically signed by: Pinkie Pebbles MD 06/13/2024 02:23 AM EST RP Workstation: [id number]   CT Head Wo Contrast Result Date: 06/12/2024 EXAM: CT HEAD WITHOUT CONTRAST 06/12/2024 04:20:33 PM TECHNIQUE: CT of the head was performed without the administration of intravenous contrast. Automated exposure control, iterative reconstruction, and/or weight based adjustment of the mA/kV was utilized to reduce the radiation dose to as low as reasonably achievable. COMPARISON: None available. CLINICAL HISTORY: Mental status change, unknown cause Mental status change, unknown cause. FINDINGS: BRAIN AND VENTRICLES:  No acute hemorrhage. No evidence of acute infarct. No hydrocephalus. No extra-axial collection. No mass effect or midline shift. Similar remote right thalamic lacunar infarct and cerebral atrophy. ORBITS: No acute abnormality. SINUSES: No acute abnormality. SOFT TISSUES AND SKULL: No acute soft tissue abnormality. No skull fracture. IMPRESSION: 1. No acute intracranial abnormality. Electronically signed by: Glendia  Joshua MD 06/12/2024 04:39 PM EST RP Workstation: [id number]   DG Chest Port 1 View Result Date: 06/12/2024 CLINICAL DATA:  Questionable sepsis. EXAM: PORTABLE CHEST 1 VIEW COMPARISON:  Chest radiograph dated 06/02/2024. FINDINGS: Stable cardiomegaly with mild vascular congestion. No focal consolidation, pleural effusion or pneumothorax. Large hiatal hernia. No acute osseous pathology. IMPRESSION: 1. Cardiomegaly with mild vascular congestion. No focal consolidation. 2. Large hiatal hernia. Electronically Signed   By: Vanetta Chou M.D.   On: 06/12/2024 15:44   DG CHEST PORT 1 VIEW Result Date: 06/02/2024 EXAM: 1 VIEW(S) XRAY OF THE CHEST 06/02/2024 03:57:00 PM COMPARISON: 05/30/2024 CLINICAL HISTORY: Dyspnea FINDINGS: LUNGS AND PLEURA: Low lung volumes. Left basilar atelectasis. Large hiatal hernia with majority of stomach in left lung base, unchanged, with associated passive atelectasis. No pleural effusion. No pneumothorax. HEART AND MEDIASTINUM: Aortic atherosclerosis. No acute abnormality of the cardiac and mediastinal silhouettes. BONES AND SOFT TISSUES: No acute osseous abnormality. IMPRESSION: 1. Large hiatal hernia with majority of stomach in the left lung base with associated passive left basilar atelectasis. 2. Low lung volumes. 3. Aortic atherosclerosis. Electronically signed by: Ryan Salvage MD MD 06/02/2024 05:03 PM EST RP Workstation: [id number]   CT Renal Stone Study Result Date: 05/30/2024 CLINICAL DATA:  Flank pain. EXAM: CT ABDOMEN AND PELVIS WITHOUT CONTRAST TECHNIQUE:  Multidetector CT imaging of the abdomen and pelvis was performed following the standard protocol without IV contrast. RADIATION DOSE REDUCTION: This exam was performed according to the departmental dose-optimization program which includes automated exposure control, adjustment of the mA and/or kV according to patient size and/or use of iterative reconstruction technique. COMPARISON:  May 22, 2024 FINDINGS: Lower chest: No acute abnormality. Hepatobiliary: No focal liver abnormality is seen. Status post cholecystectomy. No biliary dilatation. Pancreas: Diffuse pancreatic atrophy is seen. No pancreatic ductal dilatation or surrounding inflammatory changes. Spleen: Normal in size without focal abnormality. Adrenals/Urinary Tract: Adrenal glands are unremarkable. Kidneys are normal in size without focal lesions. Properly positioned bilateral endo ureteral stents are seen. This represents a new finding when compared to the prior study. Numerous bilateral subcentimeter non-obstructing renal calculi are noted. A 3 mm ureteral calculus is seen within the mid left ureter (best seen on coronal reformatted image 50, CT series 6). A 2.6 cm urinary bladder calculus is seen within a poorly distended urinary bladder. Mild to moderate severity diffuse urinary bladder wall thickening is present with a mild amount of surrounding inflammatory fat stranding. Stomach/Bowel: There is a large, stable gastric hernia. The appendix is surgically absent. No evidence of bowel wall thickening, distention, or inflammatory changes. Vascular/Lymphatic: Aortic atherosclerosis. No enlarged abdominal or pelvic lymph nodes. Reproductive: There is moderate to marked severity prostate gland enlargement. Other: A stable 5.4 cm x 5.3 cm right inguinal hernia is seen. This contains fat and normal appearing loops of distal ileum. A stable 4.1 cm x 5.9 cm fat containing left inguinal hernia is noted. No abdominopelvic ascites. Musculoskeletal: A chronic  compression fracture deformity is seen at the level of L3 with multilevel degenerative changes present throughout the lumbar spine. IMPRESSION: 1. Properly positioned bilateral endo ureteral stents. 2. 3 mm mid left ureteral calculus. 3. Numerous bilateral subcentimeter non-obstructing renal calculi. 4. 2.6 cm urinary bladder calculus. 5. Mild to moderate severity diffuse urinary bladder wall thickening with a mild amount of surrounding inflammatory fat stranding. Correlation with urinalysis is recommended to exclude acute cystitis. 6. Large, stable gastric hernia. 7. Stable bilateral inguinal hernias, as described above. 8. Chronic compression fracture deformity at  the level of L3. 9. Aortic atherosclerosis. Electronically Signed   By: Suzen Dials M.D.   On: 05/30/2024 12:49   DG Finger Thumb Left Result Date: 05/30/2024 CLINICAL DATA:  Left thumb pain EXAM: LEFT THUMB 2+V COMPARISON:  None Available. FINDINGS: There is no evidence of fracture or dislocation. Minimal degenerative changes seen involving the first interphalangeal joint. Persistent flexion of this joint is noted as well. Soft tissues are unremarkable. IMPRESSION: No definite fracture or dislocation. Minimal degenerative change involving first interphalangeal joint with persistent flexion. Electronically Signed   By: Lynwood Landy Raddle M.D.   On: 05/30/2024 11:48   DG Chest Port 1 View Result Date: 05/30/2024 CLINICAL DATA:  Sepsis EXAM: PORTABLE CHEST 1 VIEW COMPARISON:  May 22, 2024 FINDINGS: Stable cardiomediastinal silhouette. Right lung is clear. Large left hiatal hernia is again noted. Minimal left basilar subsegmental atelectasis. Bony thorax is unremarkable. IMPRESSION: Large left hiatal hernia. Minimal left basilar subsegmental atelectasis. Electronically Signed   By: Lynwood Landy Raddle M.D.   On: 05/30/2024 11:46   DG C-Arm 1-60 Min-No Report Result Date: 05/23/2024 Fluoroscopy was utilized by the requesting physician.  No  radiographic interpretation.   CT Angio Chest PE W and/or Wo Contrast Result Date: 05/22/2024 EXAM: CTA CHEST PE WITHOUT AND WITH CONTRAST CT ABDOMEN AND PELVIS WITHOUT AND WITH CONTRAST 05/22/2024 10:47:02 PM TECHNIQUE: CTA of the chest was performed after the administration of 75 mL of intravenous contrast (iohexol  (OMNIPAQUE ) 350 MG/ML injection). Multiplanar reformatted images are provided for review. MIP images are provided for review. CT of the abdomen and pelvis was performed with and without the administration of intravenous contrast. Automated exposure control, iterative reconstruction, and/or weight based adjustment of the mA/kV was utilized to reduce the radiation dose to as low as reasonably achievable. COMPARISON: Findings were compared to prior studies from 2022 and 02/11/2022. CLINICAL HISTORY: Pulmonary embolism (PE) high prob. Nephrolithiasis, abdominal and flank pain, hyperbilirubinemia, pulmonary embolism. FINDINGS: CHEST: PULMONARY ARTERIES: Pulmonary arteries are adequately opacified for evaluation. No intraluminal filling defect to suggest pulmonary embolism. Main pulmonary artery is normal in caliber. MEDIASTINUM: Extensive multivessel coronary artery calcification. The heart and pericardium demonstrate no acute abnormality. 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 The descending thoracic aorta is of normal caliber. No mediastinal lymphadenopathy. LUNGS AND PLEURA: Large hiatal hernia containing the entire stomach which demonstrates organoaxial gastric volvulus, similar to prior examination, without obstruction. Resultant marked elevation of the left hemidiaphragm, left basilar atelectasis and left-sided volume loss. Mild right basilar atelectasis. No pleural effusion or pneumothorax. SOFT TISSUES AND BONES: Osseous structures are age appropriate. No acute soft tissue  abnormality. ABDOMEN AND PELVIS: LIVER: The liver is unremarkable. GALLBLADDER AND BILE DUCTS: Status post cholecystectomy. No biliary ductal dilatation. SPLEEN: Spleen demonstrates no acute abnormality. PANCREAS: Moderate fatty atrophy of the pancreas. There is increasing herniation of the proximal tail of the pancreas into the thoracic compartment. No evidence of superimposed inflammatory change, however. ADRENAL GLANDS: Adrenal glands demonstrate no acute abnormality. KIDNEYS, URETERS AND BLADDER: Mild asymmetric right renal cortical atrophy. Simple cortical cyst arises from the lower pole of the right kidney. No follow up imaging is recommended. Bilateral nonobstructing renal calculi are noted within the lower pole of the right kidney and within the renal pelvis bilaterally measuring up to 14 mm in the left renal pelvis and 16 mm within the right renal pelvis. No hydronephrosis. No ureteral  calculi on the right. Nonobstructing 4 mm calculus within the mid left ureter at the level of the crossing iliac vasculature. 27 mm calculus noted dependently within the bladder lumen. 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 The bladder is not distended. 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 No perinephric or periureteral stranding. GI AND BOWEL: Large right inguinal hernia contains the terminal ileum which appears unremarkable. Moderate fat-containing left inguinal hernia. No evidence of obstruction or focal inflammation. Status post appendectomy. The duodenal sweep demonstrates no acute abnormality. There is no bowel obstruction. No abnormal bowel wall thickening or distension. REPRODUCTIVE: Moderate prostatic hypertrophy is present and together, the findings suggest changes of chronic bladder  outlet obstruction secondary to central prostatic hypertrophy. PERITONEUM AND RETROPERITONEUM: No ascites or free air. LYMPH NODES: No lymphadenopathy. BONES AND SOFT TISSUES: Subacute to remote-appearing anterior wedge compression deformity of L3 with 50% loss of height, new from prior examination. Osseous structures are otherwise age appropriate. Moderate aortoiliac atherosclerotic calcification. No focal soft tissue abnormality. LIMITATIONS/ARTIFACTS: Imaging is limited by respiratory motion artifact. IMPRESSION: 1. No pulmonary embolism. 2. 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 3. 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 Recommend annual imaging follow-up by CTA or mra. This recommendation follows 2010 accf/aha/aats/ACR/asa/sca/scai/sir/sts/svm guidelines for the diagnosis and management of patients with thoracic aortic disease. Circulation. 2010; 121: Z733-z630. Aortic aneurysm nos (icd10-i71.9) 4. Bilateral nonobstructing renal calculi, with a 4 mm nonobstructing calculus in the mid left ureter and a 27 mm calculus in the bladder lumen. 5. 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 6. Large right inguinal hernia containing the terminal ileum and moderate fat-containing left inguinal hernia, without obstruction or focal inflammation. 7. Subacute to remote-appearing anterior wedge compression deformity of L3 with 50% loss of height, new from prior examination. Correlation with clinical exam is recommended. If indicated, MRI examination will be more helpful to better age the fracture. 8. Extensive multivessel coronary artery calcification. 9. Status post cholecystectomy and appendectomy. Electronically signed by: Dorethia Molt MD 05/22/2024 11:39 PM EST RP Workstation: [id number]   CT  ABDOMEN PELVIS W CONTRAST Result Date: 05/22/2024 EXAM: CTA CHEST PE WITHOUT AND WITH CONTRAST CT ABDOMEN AND PELVIS WITHOUT AND WITH CONTRAST 05/22/2024 10:47:02 PM TECHNIQUE: CTA of the chest was performed after the administration of 75 mL of intravenous contrast (iohexol  (OMNIPAQUE ) 350 MG/ML injection). Multiplanar reformatted images are provided for review. MIP images are provided for review. CT of the abdomen and pelvis was performed with and without the administration of intravenous contrast. Automated exposure control, iterative reconstruction, and/or weight based adjustment of the mA/kV was utilized to reduce the radiation dose to as low as reasonably achievable. COMPARISON: Findings were compared to prior studies from 2022 and 02/11/2022. CLINICAL HISTORY: Pulmonary embolism (PE) high prob. Nephrolithiasis, abdominal and flank pain, hyperbilirubinemia, pulmonary embolism. FINDINGS: CHEST: PULMONARY ARTERIES: Pulmonary arteries are adequately opacified for evaluation. No intraluminal filling defect to suggest pulmonary embolism. Main pulmonary artery is normal in caliber. MEDIASTINUM: Extensive multivessel coronary artery calcification. The heart and pericardium demonstrate no acute abnormality. 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 The descending thoracic  aorta is of normal caliber. No mediastinal lymphadenopathy. LUNGS AND PLEURA: Large hiatal hernia containing the entire stomach which demonstrates organoaxial gastric volvulus, similar to prior examination, without obstruction. Resultant marked elevation of the left hemidiaphragm, left basilar atelectasis and left-sided volume loss. Mild right basilar atelectasis. No pleural effusion or pneumothorax. SOFT TISSUES AND BONES: Osseous structures are age appropriate. No acute soft tissue abnormality. ABDOMEN AND PELVIS: LIVER: The liver is  unremarkable. GALLBLADDER AND BILE DUCTS: Status post cholecystectomy. No biliary ductal dilatation. SPLEEN: Spleen demonstrates no acute abnormality. PANCREAS: Moderate fatty atrophy of the pancreas. There is increasing herniation of the proximal tail of the pancreas into the thoracic compartment. No evidence of superimposed inflammatory change, however. ADRENAL GLANDS: Adrenal glands demonstrate no acute abnormality. KIDNEYS, URETERS AND BLADDER: Mild asymmetric right renal cortical atrophy. Simple cortical cyst arises from the lower pole of the right kidney. No follow up imaging is recommended. Bilateral nonobstructing renal calculi are noted within the lower pole of the right kidney and within the renal pelvis bilaterally measuring up to 14 mm in the left renal pelvis and 16 mm within the right renal pelvis. No hydronephrosis. No ureteral calculi on the right. Nonobstructing 4 mm calculus within the mid left ureter at the level of the crossing iliac vasculature. 27 mm calculus noted dependently within the bladder lumen. 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 The bladder is not distended. 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 No perinephric or periureteral stranding. GI AND BOWEL: Large right inguinal hernia contains the terminal ileum which appears unremarkable. Moderate fat-containing left inguinal hernia. No evidence of obstruction or focal inflammation. Status post appendectomy. The duodenal sweep demonstrates no acute abnormality. There is no bowel obstruction. No abnormal bowel wall thickening or distension. REPRODUCTIVE: Moderate prostatic hypertrophy is present and together, the findings suggest changes of chronic bladder outlet obstruction secondary to central prostatic  hypertrophy. PERITONEUM AND RETROPERITONEUM: No ascites or free air. LYMPH NODES: No lymphadenopathy. BONES AND SOFT TISSUES: Subacute to remote-appearing anterior wedge compression deformity of L3 with 50% loss of height, new from prior examination. Osseous structures are otherwise age appropriate. Moderate aortoiliac atherosclerotic calcification. No focal soft tissue abnormality. LIMITATIONS/ARTIFACTS: Imaging is limited by respiratory motion artifact. IMPRESSION: 1. No pulmonary embolism. 2. 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 3. 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 Recommend annual imaging follow-up by CTA or mra. This recommendation follows 2010 accf/aha/aats/ACR/asa/sca/scai/sir/sts/svm guidelines for the diagnosis and management of patients with thoracic aortic disease. Circulation. 2010; 121: [id number]. Aortic aneurysm nos (icd10-i71.9) 4. Bilateral nonobstructing renal calculi, with a 4 mm nonobstructing calculus in the mid left ureter and a 27 mm calculus in the bladder lumen. 5. 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 6. Large right inguinal hernia containing the terminal ileum and moderate fat-containing left inguinal hernia, without obstruction or focal inflammation. 7. Subacute to remote-appearing anterior wedge compression deformity of L3 with 50% loss of height, new from prior examination. Correlation with clinical exam is recommended. If indicated, MRI examination will be more helpful to better age the fracture. 8. Extensive multivessel coronary artery calcification. 9. Status post cholecystectomy and appendectomy. Electronically signed by: Dorethia Molt MD 05/22/2024 11:39 PM EST RP Workstation:  [id number]   DG Chest Port 1 View Result Date: 05/22/2024 EXAM: 1  VIEW(S) XRAY OF THE CHEST 05/22/2024 08:51:07 PM COMPARISON: None available. CLINICAL HISTORY: Back pain. SOB (shortness of breath). FINDINGS: LUNGS AND PLEURA: Low lung volumes accentuate pulmonary vascularity. Elevated left hemidiaphragm. Left basilar atelectasis. No pleural effusion. No pneumothorax. HEART AND MEDIASTINUM: Low lung volumes accentuate the cardiomediastinal silhouette. Aortic atherosclerotic calcification is present. BONES AND SOFT TISSUES: Gaseous gastric distension. No acute osseous abnormality. IMPRESSION: 1. No acute cardiopulmonary abnormality. 2. Low lung volumes with left basilar atelectasis. 3. Large hiatal hernia with gaseous distention of the intrathoric stomach. Please correlate clinically. Electronically signed by: Greig Pique MD 05/22/2024 08:56 PM EST RP Workstation: [id number]     TODAY-DAY OF DISCHARGE:  Subjective:   Tanda Parkin today has no headache,no chest abdominal pain,no new weakness tingling or numbness, feels much better wants to go home today.   Objective:   Blood pressure (!) 140/88, pulse 86, temperature 97.7 F (36.5 C), temperature source Oral, resp. rate 13, height 5' 6 (1.676 m), weight 74.3 kg, SpO2 95%.  Intake/Output Summary (Last 24 hours) at 06/19/2024 0916 Last data filed at 06/18/2024 1300 Gross per 24 hour  Intake 240 ml  Output 825 ml  Net -585 ml   Filed Weights   06/17/24 0500 06/18/24 0500 06/19/24 0500  Weight: 74.6 kg 73.9 kg 74.3 kg    Exam: Awake Alert, Oriented *3, No new F.N deficits, Normal affect [place].AT,PERRAL Supple Neck,No JVD, No cervical lymphadenopathy appriciated.  Symmetrical Chest wall movement, Good air movement bilaterally, CTAB RRR,No Gallops,Rubs or new Murmurs, No Parasternal Heave +ve B.Sounds, Abd Soft, Non tender, No organomegaly appriciated, No rebound -guarding or rigidity. No Cyanosis, Clubbing or edema, No new Rash or bruise   PERTINENT RADIOLOGIC STUDIES: No results found.   PERTINENT  LAB RESULTS: CBC: Recent Labs    06/17/24 0327 06/18/24 0313  WBC 8.8 8.3  HGB 12.1* 12.0*  HCT 36.6* 36.6*  PLT 187 181   CMET CMP     Component Value Date/Time   NA 140 06/18/2024 0313   NA 141 12/02/2019 1052   K 3.8 06/18/2024 0313   CL 106 06/18/2024 0313   CO2 24 06/18/2024 0313   GLUCOSE 118 (H) 06/18/2024 0313   BUN 16 06/18/2024 0313   BUN 16 12/02/2019 1052   CREATININE 0.60 (L) 06/18/2024 0313   CALCIUM  8.6 (L) 06/18/2024 0313   PROT 6.4 (L) 06/12/2024 1517   PROT 6.5 12/02/2019 1052   ALBUMIN 3.0 (L) 06/12/2024 1517   ALBUMIN 3.9 12/02/2019 1052   AST 32 06/12/2024 1517   ALT 23 06/12/2024 1517   ALKPHOS 87 06/12/2024 1517   BILITOT 0.4 06/12/2024 1517   BILITOT 0.4 12/02/2019 1052   GFRNONAA >60 06/18/2024 0313    GFR Estimated Creatinine Clearance: 65.4 mL/min (A) (by C-G formula based on SCr of 0.6 mg/dL (L)). No results for input(s): LIPASE, AMYLASE in the last 72 hours. No results for input(s): CKTOTAL, CKMB, CKMBINDEX, TROPONINI in the last 72 hours. Invalid input(s): POCBNP No results for input(s): DDIMER in the last 72 hours. No results for input(s): HGBA1C in the last 72 hours. No results for input(s): CHOL, HDL, LDLCALC, TRIG, CHOLHDL, LDLDIRECT in the last 72 hours. No results for input(s): TSH, T4TOTAL, T3FREE, THYROIDAB in the last 72 hours.  Invalid input(s): FREET3 No results for input(s): VITAMINB12, FOLATE, FERRITIN, TIBC, IRON, RETICCTPCT in the last 72 hours. Coags: No results for input(s): INR in the last  72 hours.  Invalid input(s): PT Microbiology: Recent Results (from the past 240 hours)  Blood Culture (routine x 2)     Status: None   Collection Time: 06/12/24  6:27 PM   Specimen: BLOOD LEFT HAND  Result Value Ref Range Status   Specimen Description BLOOD LEFT HAND  Final   Special Requests   Final    BOTTLES DRAWN AEROBIC AND ANAEROBIC Blood Culture results may not  be optimal due to an inadequate volume of blood received in culture bottles   Culture   Final    NO GROWTH 5 DAYS Performed at [facility], [address], [place], [place] [address]    Report Status 06/17/2024 FINAL  Final  Urine Culture     Status: Abnormal   Collection Time: 06/12/24  7:45 PM   Specimen: Urine, Random  Result Value Ref Range Status   Specimen Description URINE, RANDOM  Final   Special Requests   Final    NONE Reflexed from [id number] Performed at [facility], [address] [place], [place] [address]    Culture (A)  Final    >=100,000 COLONIES/mL ENTEROCOCCUS FAECALIS 20,000 COLONIES/mL PROTEUS MIRABILIS    Report Status 06/16/2024 FINAL  Final   Organism ID, Bacteria ENTEROCOCCUS FAECALIS (A)  Final   Organism ID, Bacteria PROTEUS MIRABILIS (A)  Final      Susceptibility   Enterococcus faecalis - MIC*    AMPICILLIN  <=2 SENSITIVE Sensitive     NITROFURANTOIN  <=16 SENSITIVE Sensitive     VANCOMYCIN  1 SENSITIVE Sensitive     * >=100,000 COLONIES/mL ENTEROCOCCUS FAECALIS   Proteus mirabilis - MIC*    AMPICILLIN  8 SENSITIVE Sensitive     CEFAZOLIN  (URINE) Value in next row Sensitive      4 SENSITIVEThis is a modified FDA-approved test that has been validated and its performance characteristics determined by the reporting laboratory.  This laboratory is certified under the Clinical Laboratory Improvement Amendments CLIA as qualified to perform high complexity clinical laboratory testing.    CEFEPIME  Value in next row Sensitive      4 SENSITIVEThis is a modified FDA-approved test that has been validated and its performance characteristics determined by the reporting laboratory.  This laboratory is certified under the Clinical Laboratory Improvement Amendments CLIA as qualified to perform high complexity clinical laboratory testing.    ERTAPENEM Value in next row Sensitive      4 SENSITIVEThis is a modified FDA-approved test that has been validated and its  performance characteristics determined by the reporting laboratory.  This laboratory is certified under the Clinical Laboratory Improvement Amendments CLIA as qualified to perform high complexity clinical laboratory testing.    CEFTRIAXONE  Value in next row Sensitive      4 SENSITIVEThis is a modified FDA-approved test that has been validated and its performance characteristics determined by the reporting laboratory.  This laboratory is certified under the Clinical Laboratory Improvement Amendments CLIA as qualified to perform high complexity clinical laboratory testing.    CIPROFLOXACIN  Value in next row Resistant      4 SENSITIVEThis is a modified FDA-approved test that has been validated and its performance characteristics determined by the reporting laboratory.  This laboratory is certified under the Clinical Laboratory Improvement Amendments CLIA as qualified to perform high complexity clinical laboratory testing.    GENTAMICIN Value in next row Sensitive      4 SENSITIVEThis is a modified FDA-approved test that has been validated and its performance characteristics determined by  the reporting laboratory.  This laboratory is certified under the Clinical Laboratory Improvement Amendments CLIA as qualified to perform high complexity clinical laboratory testing.    NITROFURANTOIN  Value in next row Resistant      4 SENSITIVEThis is a modified FDA-approved test that has been validated and its performance characteristics determined by the reporting laboratory.  This laboratory is certified under the Clinical Laboratory Improvement Amendments CLIA as qualified to perform high complexity clinical laboratory testing.    TRIMETH /SULFA  Value in next row Sensitive      4 SENSITIVEThis is a modified FDA-approved test that has been validated and its performance characteristics determined by the reporting laboratory.  This laboratory is certified under the Clinical Laboratory Improvement Amendments CLIA as qualified  to perform high complexity clinical laboratory testing.    AMPICILLIN /SULBACTAM Value in next row Sensitive      4 SENSITIVEThis is a modified FDA-approved test that has been validated and its performance characteristics determined by the reporting laboratory.  This laboratory is certified under the Clinical Laboratory Improvement Amendments CLIA as qualified to perform high complexity clinical laboratory testing.    PIP/TAZO Value in next row Sensitive      <=4 SENSITIVEThis is a modified FDA-approved test that has been validated and its performance characteristics determined by the reporting laboratory.  This laboratory is certified under the Clinical Laboratory Improvement Amendments CLIA as qualified to perform high complexity clinical laboratory testing.    MEROPENEM Value in next row Sensitive      <=4 SENSITIVEThis is a modified FDA-approved test that has been validated and its performance characteristics determined by the reporting laboratory.  This laboratory is certified under the Clinical Laboratory Improvement Amendments CLIA as qualified to perform high complexity clinical laboratory testing.    * 20,000 COLONIES/mL PROTEUS MIRABILIS  Blood Culture (routine x 2)     Status: None   Collection Time: 06/12/24  8:54 PM   Specimen: BLOOD RIGHT HAND  Result Value Ref Range Status   Specimen Description BLOOD RIGHT HAND  Final   Special Requests   Final    BOTTLES DRAWN AEROBIC AND ANAEROBIC Blood Culture adequate volume   Culture   Final    NO GROWTH 5 DAYS Performed at [facility], [address], [place], [place] [address]    Report Status 06/17/2024 FINAL  Final    FURTHER DISCHARGE INSTRUCTIONS:  Get Medicines reviewed and adjusted: Please take all your medications with you for your next visit with your Primary MD  Laboratory/radiological data: Please request your Primary MD to go over all hospital tests and procedure/radiological results at the follow up, please ask  your Primary MD to get all Hospital records sent to his/her office.  In some cases, they will be blood work, cultures and biopsy results pending at the time of your discharge. Please request that your primary care M.D. goes through all the records of your hospital data and follows up on these results.  Also Note the following: If you experience worsening of your admission symptoms, develop shortness of breath, life threatening emergency, suicidal or homicidal thoughts you must seek medical attention immediately by calling 911 or calling your MD immediately  if symptoms less severe.  You must read complete instructions/literature along with all the possible adverse reactions/side effects for all the Medicines you take and that have been prescribed to you. Take any new Medicines after you have completely understood and accpet all the possible adverse reactions/side effects.   Do not drive  when taking Pain medications or sleeping medications (Benzodaizepines)  Do not take more than prescribed Pain, Sleep and Anxiety Medications. It is not advisable to combine anxiety,sleep and pain medications without talking with your primary care practitioner  Special Instructions: If you have smoked or chewed Tobacco  in the last 2 yrs please stop smoking, stop any regular Alcohol   and or any Recreational drug use.  Wear Seat belts while driving.  Please note: You were cared for by a hospitalist during your hospital stay. Once you are discharged, your primary care physician will handle any further medical issues. Please note that NO REFILLS for any discharge medications will be authorized once you are discharged, as it is imperative that you return to your primary care physician (or establish a relationship with a primary care physician if you do not have one) for your post hospital discharge needs so that they can reassess your need for medications and monitor your lab values.  Total Time spent coordinating  discharge including counseling, education and face to face time equals greater than 30 minutes.  Signed: Donalda Applebaum 06/19/2024 9:16 AM      [1] No Active Allergies  "

## 2024-06-19 NOTE — TOC Progression Note (Addendum)
 Transition of Care [facility]) - Progression Note    Patient Details  Name: Gregory Kerr MRN: [id number] Date of Birth: Dec 29, 1940  [facility] CM/SW Contact  Inocente GORMAN Kindle, LCSW Phone Number: 06/19/2024, 9:29 AM  Clinical Narrative:    [name] can accept patient back today. Will arrange [facility] for transport. Updated patient's cousin, Vina. Confirmed with RN that patient is on 2L O2.   Expected Discharge Plan: Long Term Nursing Home Barriers to Discharge: Barriers Resolved               Expected Discharge Plan and Services In-house Referral: Clinical Social Work   Post Acute Care Choice: Skilled Nursing Facility Living arrangements for the past 2 months: Skilled Nursing Facility Expected Discharge Date: 06/19/24                                     Social Drivers of Health (SDOH) Interventions SDOH Screenings   Food Insecurity: Patient Unable To Answer (06/13/2024)  Housing: Patient Unable To Answer (06/13/2024)  Transportation Needs: Patient Unable To Answer (06/13/2024)  Utilities: Patient Unable To Answer (06/13/2024)  Social Connections: Patient Unable To Answer (06/13/2024)  Recent Concern: Social Connections - Socially Isolated (05/31/2024)  Tobacco Use: Medium Risk (06/12/2024)    Readmission Risk Interventions    02/15/2022   12:13 PM 10/13/2021   12:30 PM  Readmission Risk Prevention Plan  Transportation Screening Complete Complete  Medication Review Oceanographer) Complete Referral to Pharmacy  PCP or Specialist appointment within 3-5 days of discharge Complete Not Complete  PCP/Specialist Appt Not Complete comments  Still inpatient without discharge date at this time  HRI or Home Care Consult Complete Not Complete  HRI or Home Care Consult Pt Refusal Comments  Patient is LTC at SNF  SW Recovery Care/Counseling Consult Complete Complete  Palliative Care Screening Complete Not Applicable  Skilled Nursing Facility Complete Complete

## 2024-06-19 NOTE — TOC Transition Note (Signed)
 Transition of Care [facility]) - Discharge Note   Patient Details  Name: Gregory Kerr MRN: [id number] Date of Birth: 03/05/1941  Transition [facility] Care [facility]) CM/SW Contact:  Inocente GORMAN Kindle, LCSW Phone Number: 06/19/2024, 12:10 PM   Clinical Narrative:    Patient will DC to: [facility] Anticipated DC date: 06/19/24 Family notified: Arzella Moccasin Transport by: [facility]   Per MD patient ready for DC to [facility]. RN to call report prior to discharge [phone] room 805A). RN, patient, patient's family, and facility notified of DC. Discharge Summary and FL2 sent to facility. DC packet on chart including signed DNR and script. Ambulance transport requested for patient.   CSW will sign off for now as social work intervention is no longer needed. Please consult us  again if new needs arise.     Final next level of care: Long Term Nursing Home Barriers to Discharge: Barriers Resolved   Patient Goals and CMS Choice Patient states their goals for this hospitalization and ongoing recovery are:: Return to snf          Discharge Placement   Existing PASRR number confirmed : 06/19/24          Patient [facility] bed at: [facility] Patient to be transferred to facility by: [facility] Name of family member notified: Cousin Patient and family notified of of transfer: 06/19/24  Discharge Plan and Services Additional resources added to the After Visit Summary for   In-house Referral: Clinical Social Work   Post Acute Care Choice: Skilled Nursing Facility                               Social Drivers of Health (SDOH) Interventions SDOH Screenings   Food Insecurity: Patient Unable To Answer (06/13/2024)  Housing: Patient Unable To Answer (06/13/2024)  Transportation Needs: Patient Unable To Answer (06/13/2024)  Utilities: Patient Unable To Answer (06/13/2024)  Social Connections: Patient Unable To Answer (06/13/2024)  Recent Concern: Social Connections - Socially Isolated (05/31/2024)  Tobacco  Use: Medium Risk (06/12/2024)     Readmission Risk Interventions    02/15/2022   12:13 PM 10/13/2021   12:30 PM  Readmission Risk Prevention Plan  Transportation Screening Complete Complete  Medication Review Oceanographer) Complete Referral to Pharmacy  PCP or Specialist appointment within 3-5 days of discharge Complete Not Complete  PCP/Specialist Appt Not Complete comments  Still inpatient without discharge date at this time  HRI or Home Care Consult Complete Not Complete  HRI or Home Care Consult Pt Refusal Comments  Patient is LTC at SNF  SW Recovery Care/Counseling Consult Complete Complete  Palliative Care Screening Complete Not Applicable  Skilled Nursing Facility Complete Complete
